# Patient Record
Sex: Male | Born: 1955 | Race: Black or African American | Hispanic: No | Smoking: Former smoker
Health system: Southern US, Community
[De-identification: ages and names within clinical notes are randomized; demographics above are authoritative.]

## PROBLEM LIST (undated history)

## (undated) DIAGNOSIS — I350 Nonrheumatic aortic (valve) stenosis: Secondary | ICD-10-CM

## (undated) DIAGNOSIS — R519 Headache, unspecified: Secondary | ICD-10-CM

## (undated) DIAGNOSIS — K759 Inflammatory liver disease, unspecified: Secondary | ICD-10-CM

## (undated) DIAGNOSIS — I10 Essential (primary) hypertension: Secondary | ICD-10-CM

## (undated) DIAGNOSIS — C801 Malignant (primary) neoplasm, unspecified: Secondary | ICD-10-CM

## (undated) DIAGNOSIS — R011 Cardiac murmur, unspecified: Secondary | ICD-10-CM

## (undated) HISTORY — DX: Essential (primary) hypertension: I10

## (undated) HISTORY — DX: Cardiac murmur, unspecified: R01.1

---
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
## 2009-02-09 ENCOUNTER — Ambulatory Visit: Payer: Self-pay | Admitting: Psychiatry

## 2009-02-09 ENCOUNTER — Other Ambulatory Visit (HOSPITAL_COMMUNITY): Admission: RE | Admit: 2009-02-09 | Discharge: 2009-04-25 | Payer: Self-pay | Admitting: Psychiatry

## 2009-02-23 ENCOUNTER — Other Ambulatory Visit (HOSPITAL_COMMUNITY): Payer: Self-pay | Admitting: Psychiatry

## 2011-02-18 LAB — URINE DRUGS OF ABUSE SCREEN W ALC, ROUTINE (REF LAB)
Cocaine Metabolites: NEGATIVE
Cocaine Metabolites: NEGATIVE
Creatinine,U: 49.4 mg/dL
Methadone: NEGATIVE
Methadone: NEGATIVE
Opiate Screen, Urine: NEGATIVE
Phencyclidine (PCP): NEGATIVE
Phencyclidine (PCP): NEGATIVE
Propoxyphene: NEGATIVE
Propoxyphene: NEGATIVE

## 2011-02-19 LAB — URINE DRUGS OF ABUSE SCREEN W ALC, ROUTINE (REF LAB)
Barbiturate Quant, Ur: NEGATIVE
Barbiturate Quant, Ur: NEGATIVE
Benzodiazepines.: NEGATIVE
Benzodiazepines.: NEGATIVE
Benzodiazepines.: NEGATIVE
Cocaine Metabolites: NEGATIVE
Creatinine,U: 199.6 mg/dL
Marijuana Metabolite: NEGATIVE
Methadone: NEGATIVE
Methadone: NEGATIVE
Methadone: NEGATIVE
Phencyclidine (PCP): NEGATIVE
Phencyclidine (PCP): NEGATIVE
Propoxyphene: NEGATIVE
Propoxyphene: NEGATIVE

## 2011-02-20 LAB — URINE DRUGS OF ABUSE SCREEN W ALC, ROUTINE (REF LAB)
Amphetamine Screen, Ur: NEGATIVE
Amphetamine Screen, Ur: NEGATIVE
Barbiturate Quant, Ur: NEGATIVE
Barbiturate Quant, Ur: NEGATIVE
Barbiturate Quant, Ur: NEGATIVE
Creatinine,U: 213.9 mg/dL
Creatinine,U: 159.2 mg/dL
Creatinine,U: 208.1 mg/dL
Marijuana Metabolite: NEGATIVE
Methadone: NEGATIVE
Methadone: NEGATIVE
Methadone: NEGATIVE
Phencyclidine (PCP): NEGATIVE
Phencyclidine (PCP): NEGATIVE

## 2012-08-20 ENCOUNTER — Other Ambulatory Visit (INDEPENDENT_AMBULATORY_CARE_PROVIDER_SITE_OTHER): Payer: 59

## 2012-08-20 ENCOUNTER — Encounter: Payer: Self-pay | Admitting: Internal Medicine

## 2012-08-20 ENCOUNTER — Ambulatory Visit (INDEPENDENT_AMBULATORY_CARE_PROVIDER_SITE_OTHER): Payer: 59 | Admitting: Internal Medicine

## 2012-08-20 VITALS — BP 112/64 | HR 78 | Temp 97.3°F | Resp 16 | Ht 68.0 in | Wt 134.2 lb

## 2012-08-20 DIAGNOSIS — R51 Headache: Secondary | ICD-10-CM

## 2012-08-20 DIAGNOSIS — R519 Headache, unspecified: Secondary | ICD-10-CM | POA: Insufficient documentation

## 2012-08-20 LAB — COMPREHENSIVE METABOLIC PANEL
ALT: 39 U/L (ref 0–53)
AST: 47 U/L — ABNORMAL HIGH (ref 0–37)
Albumin: 3.5 g/dL (ref 3.5–5.2)
Calcium: 9.3 mg/dL (ref 8.4–10.5)
Chloride: 103 mEq/L (ref 96–112)
Potassium: 3.5 mEq/L (ref 3.5–5.1)
Sodium: 139 mEq/L (ref 135–145)

## 2012-08-20 LAB — CBC WITH DIFFERENTIAL/PLATELET
Basophils Absolute: 0 10*3/uL (ref 0.0–0.1)
Eosinophils Absolute: 0.1 10*3/uL (ref 0.0–0.7)
Lymphocytes Relative: 40.6 % (ref 12.0–46.0)
MCHC: 32.7 g/dL (ref 30.0–36.0)
MCV: 100.2 fl — ABNORMAL HIGH (ref 78.0–100.0)
Monocytes Absolute: 0.4 10*3/uL (ref 0.1–1.0)
Neutro Abs: 1.7 10*3/uL (ref 1.4–7.7)
Neutrophils Relative %: 45.1 % (ref 43.0–77.0)
RDW: 13.1 % (ref 11.5–14.6)

## 2012-08-20 LAB — SEDIMENTATION RATE: Sed Rate: 14 mm/hr (ref 0–22)

## 2012-08-20 LAB — TSH: TSH: 0.42 u[IU]/mL (ref 0.35–5.50)

## 2012-08-20 MED ORDER — HYDROCODONE-ACETAMINOPHEN 5-500 MG PO TABS: 2.0000 | ORAL_TABLET | Freq: Four times a day (QID) | ORAL | Status: DC | PRN

## 2012-08-20 NOTE — Patient Instructions (Signed)
General Headache Without Cause A headache is pain or discomfort felt around the head or neck area. The specific cause of a headache may not be found. There are many causes and types of headaches. A few common ones are:  Tension headaches.  Migraine headaches.  Cluster headaches.  Chronic daily headaches. HOME CARE INSTRUCTIONS   Keep all follow-up appointments with your caregiver or any specialist referral.  Only take over-the-counter or prescription medicines for pain or discomfort as directed by your caregiver.  Lie down in a dark, quiet room when you have a headache.  Keep a headache journal to find out what may trigger your migraine headaches. For example, write down:  What you eat and drink.  How much sleep you get.  Any change to your diet or medicines.  Try massage or other relaxation techniques.  Put ice packs or heat on the head and neck. Use these 3 to 4 times per day for 15 to 20 minutes each time, or as needed.  Limit stress.  Sit up straight, and do not tense your muscles.  Quit smoking if you smoke.  Limit alcohol use.  Decrease the amount of caffeine you drink, or stop drinking caffeine.  Eat and sleep on a regular schedule.  Get 7 to 9 hours of sleep, or as recommended by your caregiver.  Keep lights dim if bright lights bother you and make your headaches worse. SEEK MEDICAL CARE IF:   You have problems with the medicines you were prescribed.  Your medicines are not working.  You have a change from the usual headache.  You have nausea or vomiting. SEEK IMMEDIATE MEDICAL CARE IF:   Your headache becomes severe.  You have a fever.  You have a stiff neck.  You have loss of vision.  You have muscular weakness or loss of muscle control.  You start losing your balance or have trouble walking.  You feel faint or pass out.  You have severe symptoms that are different from your first symptoms. MAKE SURE YOU:   Understand these  instructions.  Will watch your condition.  Will get help right away if you are not doing well or get worse. Document Released: 10/28/2005 Document Revised: 01/20/2012 Document Reviewed: 11/13/2011 Exit[employer] Patient Information 2013 [employer].  

## 2012-08-20 NOTE — Assessment & Plan Note (Signed)
Labs today to see if he has any metabolic disease, CT of head to look for mass, avm, cva, sdh, etc. Vicodin as needed for pain.

## 2012-08-20 NOTE — Progress Notes (Signed)
Subjective:    Patient ID: Corey Armstrong, male    DOB: 1956-06-24, 56 y.o.   MRN: [id number]  Headache  This is a recurrent problem. Episode onset: for 4 months. The problem occurs constantly. The problem has been unchanged. The pain is located in the bilateral and frontal region. The pain does not radiate. The quality of the pain is described as aching. The pain is at a severity of 2/10. The pain is mild. Associated symptoms include blurred vision and numbness (in left hand). Pertinent negatives include no abdominal pain, abnormal behavior, anorexia, back pain, coughing, dizziness, drainage, ear pain, eye pain, eye redness, facial sweating, fever, hearing loss, insomnia, loss of balance, muscle aches, nausea, neck pain, phonophobia, photophobia, rhinorrhea, scalp tenderness, seizures, sinus pressure, sore throat, swollen glands, tingling, tinnitus, visual change, vomiting, weakness or weight loss. Nothing aggravates the symptoms. He has tried NSAIDs for the symptoms. The treatment provided moderate relief. There is no history of cancer, hypertension, migraine headaches, migraines in the family, recent head traumas or sinus disease.      Review of Systems  Constitutional: Negative for fever, chills, weight loss, diaphoresis, activity change, appetite change, fatigue and unexpected weight change.  HENT: Negative for hearing loss, ear pain, sore throat, rhinorrhea, neck pain, sinus pressure and tinnitus.   Eyes: Positive for blurred vision and visual disturbance. Negative for photophobia, pain, discharge, redness and itching.  Respiratory: Negative for apnea, cough, choking, chest tightness, shortness of breath, wheezing and stridor.   Cardiovascular: Negative for chest pain, palpitations and leg swelling.  Gastrointestinal: Negative for nausea, vomiting, abdominal pain, diarrhea, constipation, blood in stool and anorexia.  Genitourinary: Negative.   Musculoskeletal: Negative.  Negative for back  pain.  Skin: Negative.   Neurological: Positive for numbness (in left hand) and headaches. Negative for dizziness, tingling, tremors, seizures, syncope, facial asymmetry, speech difficulty, weakness, light-headedness and loss of balance.  Hematological: Negative for adenopathy. Does not bruise/bleed easily.  Psychiatric/Behavioral: Negative.  The patient does not have insomnia.        Objective:   Physical Exam  Vitals reviewed. Constitutional: He is oriented to person, place, and time. He appears well-developed and well-nourished. No distress.  HENT:  Head: Normocephalic and atraumatic.  Mouth/Throat: Oropharynx is clear and moist. No oropharyngeal exudate.  Eyes: Conjunctivae normal and EOM are normal. Pupils are equal, round, and reactive to light. Right eye exhibits no discharge. Left eye exhibits no discharge. No scleral icterus.  Neck: Normal range of motion. Neck supple. No JVD present. No tracheal deviation present. No thyromegaly present.  Cardiovascular: Normal rate, regular rhythm, S1 normal, S2 normal and intact distal pulses.  PMI is not displaced.  Exam reveals no gallop, no S3, no S4 and no friction rub.   Murmur heard.  Decrescendo systolic murmur is present with a grade of 1/6   No diastolic murmur is present  Pulses:      Carotid pulses are 1+ on the right side, and 1+ on the left side.      Radial pulses are 1+ on the right side, and 1+ on the left side.       Femoral pulses are 1+ on the right side, and 1+ on the left side.      Popliteal pulses are 1+ on the right side, and 1+ on the left side.       Dorsalis pedis pulses are 1+ on the right side, and 1+ on the left side.       Posterior tibial  pulses are 1+ on the right side, and 1+ on the left side.  Pulmonary/Chest: Effort normal and breath sounds normal. No stridor. No respiratory distress. He has no wheezes. He has no rales. He exhibits no tenderness.  Abdominal: Soft. Bowel sounds are normal. He exhibits no  distension and no mass. There is no tenderness. There is no rebound and no guarding.  Musculoskeletal: Normal range of motion. He exhibits no edema and no tenderness.  Lymphadenopathy:    He has no cervical adenopathy.  Neurological: He is alert and oriented to person, place, and time. He has normal strength. He displays no atrophy, no tremor and normal reflexes. No cranial nerve deficit or sensory deficit. He exhibits normal muscle tone. He displays a negative Romberg sign. He displays no seizure activity. Coordination and gait normal. He displays no Babinski's sign on the right side. He displays no Babinski's sign on the left side.  Reflex Scores:      Tricep reflexes are 1+ on the right side and 1+ on the left side.      Bicep reflexes are 1+ on the right side and 1+ on the left side.      Brachioradialis reflexes are 1+ on the right side and 1+ on the left side.      Patellar reflexes are 1+ on the right side and 1+ on the left side.      Achilles reflexes are 1+ on the right side and 1+ on the left side. Skin: Skin is warm and dry. No rash noted. He is not diaphoretic. No erythema. No pallor.  Psychiatric: He has a normal mood and affect. His behavior is normal. Judgment and thought content normal.          Assessment & Plan:

## 2012-08-21 NOTE — Addendum Note (Signed)
Addended by: Etta Grandchild on: 08/21/2012 10:26 AM   Modules accepted: Orders

## 2012-09-08 ENCOUNTER — Other Ambulatory Visit: Payer: 59

## 2014-05-30 ENCOUNTER — Telehealth: Payer: Self-pay

## 2014-05-30 NOTE — Telephone Encounter (Signed)
Pt wanted to know if our office took care of dental problems; advised no. Pt wanted to know if he could get med for pain. Advised Dr Ronnald Ramp is at [facility] office and transferred call to [facility] office for pt.

## 2017-11-26 ENCOUNTER — Ambulatory Visit: Payer: Commercial Managed Care - PPO | Admitting: Internal Medicine

## 2017-11-26 ENCOUNTER — Encounter: Payer: Self-pay | Admitting: Internal Medicine

## 2017-11-26 ENCOUNTER — Encounter (INDEPENDENT_AMBULATORY_CARE_PROVIDER_SITE_OTHER): Payer: Self-pay

## 2017-11-26 VITALS — BP 124/80 | HR 71 | Temp 97.8°F | Wt 135.0 lb

## 2017-11-26 DIAGNOSIS — H6123 Impacted cerumen, bilateral: Secondary | ICD-10-CM

## 2017-11-26 DIAGNOSIS — Z114 Encounter for screening for human immunodeficiency virus [HIV]: Secondary | ICD-10-CM | POA: Diagnosis not present

## 2017-11-26 DIAGNOSIS — Z125 Encounter for screening for malignant neoplasm of prostate: Secondary | ICD-10-CM | POA: Diagnosis not present

## 2017-11-26 DIAGNOSIS — Z1159 Encounter for screening for other viral diseases: Secondary | ICD-10-CM

## 2017-11-26 DIAGNOSIS — Z0000 Encounter for general adult medical examination without abnormal findings: Secondary | ICD-10-CM

## 2017-11-26 DIAGNOSIS — Z0001 Encounter for general adult medical examination with abnormal findings: Secondary | ICD-10-CM

## 2017-11-26 NOTE — Progress Notes (Signed)
HPI  Pt presents to the clinic today to establish care. He has not had a PCP in many years. He would like his annual exam today.  Flu: never Tetanus:  >10 years ago Colon Screening: never Vision Screening: as needed Dentist: as needed  Diet: He does eat meat. He consumes fruits and veggies daily. He does eat fried food. He drinks mostly soda. Exercise: None  Past Medical History:  Diagnosis Date  . Murmur, heart 1963    No current outpatient medications on file.   No current facility-administered medications for this visit.     No Known Allergies  Family History  Problem Relation Age of Onset  . Arthritis Mother   . Cancer Neg Hx   . Diabetes Neg Hx   . Drug abuse Neg Hx   . Heart disease Neg Hx   . Hyperlipidemia Neg Hx   . Hypertension Neg Hx   . Kidney disease Neg Hx   . Stroke Neg Hx     Social History   Socioeconomic History  . Marital status: Married    Spouse name: Not on file  . Number of children: Not on file  . Years of education: Not on file  . Highest education level: Not on file  Social Needs  . Financial resource strain: Not on file  . Food insecurity - worry: Not on file  . Food insecurity - inability: Not on file  . Transportation needs - medical: Not on file  . Transportation needs - non-medical: Not on file  Occupational History  . Not on file  Tobacco Use  . Smoking status: Current Some Day Smoker    Types: Cigars  . Smokeless tobacco: Never Used  . Tobacco comment: 3 per week  Substance and Sexual Activity  . Alcohol use: No  . Drug use: No  . Sexual activity: Not Currently  Other Topics Concern  . Not on file  Social History Narrative  . Not on file    ROS:  Constitutional: Denies fever, malaise, fatigue, headache or abrupt weight changes.  HEENT: Denies eye pain, eye redness, ear pain, ringing in the ears, wax buildup, runny nose, nasal congestion, bloody nose, or sore throat. Respiratory: Denies difficulty breathing,  shortness of breath, cough or sputum production.   Cardiovascular: Denies chest pain, chest tightness, palpitations or swelling in the hands or feet.  Gastrointestinal: Denies abdominal pain, bloating, constipation, diarrhea or blood in the stool.  GU: Denies frequency, urgency, pain with urination, blood in urine, odor or discharge. Musculoskeletal: Denies decrease in range of motion, difficulty with gait, muscle pain or joint pain and swelling.  Skin: Denies redness, rashes, lesions or ulcercations.  Neurological: Denies dizziness, difficulty with memory, difficulty with speech or problems with balance and coordination.  Psych: Denies anxiety, depression, SI/HI.  No other specific complaints in a complete review of systems (except as listed in HPI above).  PE:  BP 124/80   Pulse 71   Temp 97.8 F (36.6 C) (Oral)   Wt 135 lb (61.2 kg)   SpO2 98%   BMI 20.53 kg/m   Wt Readings from Last 3 Encounters:  11/26/17 135 lb (61.2 kg)  08/20/12 134 lb 4 oz (60.9 kg)    General: Appears his stated age, appears malnourished, in NAD. HEENT: Head: normal shape and size; Eyes: sclera white, no icterus, conjunctiva pink, PERRLA and EOMs intact; Ears: bilateral cerumen impaction;Throat/Mouth: Teeth present, mucosa pink and moist, no lesions or ulcerations noted.  Neck: Neck supple,  trachea midline. No masses, lumps or thyromegaly present.  Cardiovascular: Normal rate and rhythm. S1,S2 noted.  Murmur noted. No JVD or BLE edema. No carotid bruits noted. Pulmonary/Chest: Normal effort and positive vesicular breath sounds. No respiratory distress. No wheezes, rales or ronchi noted.  Abdomen: Soft and nontender. Normal bowel sounds, no bruits noted. No distention or masses noted. Liver, spleen and kidneys non palpable. Musculoskeletal: Strength 5/5 BUE/BLE. No signs of joint swelling. No difficulty with gait.  Neurological: Alert and oriented. Cranial nerves II-XII grossly intact. Coordination normal.   Psychiatric: Mood and affect normal. Behavior is normal. Judgment and thought content normal.     BMET    Component Value Date/Time   NA 139 08/20/2012 1520   K 3.5 08/20/2012 1520   CL 103 08/20/2012 1520   CO2 31 08/20/2012 1520   GLUCOSE 87 08/20/2012 1520   BUN 15 08/20/2012 1520   CREATININE 1.0 08/20/2012 1520   CALCIUM 9.3 08/20/2012 1520    Lipid Panel  No results found for: CHOL, TRIG, HDL, CHOLHDL, VLDL, LDLCALC  CBC    Component Value Date/Time   WBC 3.8 (L) 08/20/2012 1520   RBC 3.99 (L) 08/20/2012 1520   HGB 13.1 08/20/2012 1520   HCT 40.0 08/20/2012 1520   PLT 151.0 08/20/2012 1520   MCV 100.2 (H) 08/20/2012 1520   MCHC 32.7 08/20/2012 1520   RDW 13.1 08/20/2012 1520   LYMPHSABS 1.5 08/20/2012 1520   MONOABS 0.4 08/20/2012 1520   EOSABS 0.1 08/20/2012 1520   BASOSABS 0.0 08/20/2012 1520    Hgb A1C No results found for: HGBA1C   Assessment and Plan:  Preventative Health Maintenance:  He declines flu or tetanus booster today He declines colon cancer screening Encouraged him to consume a balanced diet and exercise regimen Advised him to see an eye doctor and dentist annually Will check CBC, CMET, Lipid, PSA, HIV and  Hep C today  Bilateral Cerumen Impaction:  Try Debrox OTC  RTC in 1 year, sooner if needed Webb Silversmith, NP

## 2017-11-26 NOTE — Patient Instructions (Signed)

## 2017-11-27 LAB — COMPREHENSIVE METABOLIC PANEL
ALT: 53 U/L (ref 0–53)
AST: 63 U/L — ABNORMAL HIGH (ref 0–37)
Albumin: 3.8 g/dL (ref 3.5–5.2)
Alkaline Phosphatase: 57 U/L (ref 39–117)
BILIRUBIN TOTAL: 0.7 mg/dL (ref 0.2–1.2)
BUN: 22 mg/dL (ref 6–23)
CHLORIDE: 109 meq/L (ref 96–112)
CO2: 27 meq/L (ref 19–32)
Calcium: 9.4 mg/dL (ref 8.4–10.5)
Creatinine, Ser: 1.1 mg/dL (ref 0.40–1.50)
GFR: 87.93 mL/min (ref >60.00)
Glucose, Bld: 101 mg/dL — ABNORMAL HIGH (ref 70–99)
Potassium: 3.6 mEq/L (ref 3.5–5.1)
Sodium: 142 mEq/L (ref 135–145)
Total Protein: 7.7 g/dL (ref 6.0–8.3)

## 2017-11-27 LAB — LIPID PANEL
CHOL/HDL RATIO: 4
Cholesterol: 166 mg/dL (ref 0–200)
HDL: 37.5 mg/dL — ABNORMAL LOW (ref >39.00)
LDL Cholesterol: 113 mg/dL — ABNORMAL HIGH (ref 0–99)
NonHDL: 128.54
Triglycerides: 80 mg/dL (ref 0.0–149.0)
VLDL: 16 mg/dL (ref 0.0–40.0)

## 2017-11-27 LAB — PSA: PSA: 0.71 ng/mL (ref 0.10–4.00)

## 2017-11-27 LAB — CBC
HCT: 39.5 % (ref 39.0–52.0)
HEMOGLOBIN: 13.3 g/dL (ref 13.0–17.0)
MCHC: 33.6 g/dL (ref 30.0–36.0)
MCV: 101.2 fl — AB (ref 78.0–100.0)
PLATELETS: 128 10*3/uL — AB (ref 150.0–400.0)
RBC: 3.91 Mil/uL — ABNORMAL LOW (ref 4.22–5.81)
RDW: 13.3 % (ref 11.5–15.5)
WBC: 4.5 10*3/uL (ref 4.0–10.5)

## 2017-11-28 LAB — HEPATITIS C ANTIBODY
HEP C AB: REACTIVE — AB
SIGNAL TO CUT-OFF: 16.3 — ABNORMAL HIGH (ref <1.00)

## 2017-11-28 LAB — HCV RNA,QUANTITATIVE REAL TIME PCR
HCV Quantitative Log: 5.87 Log IU/mL — ABNORMAL HIGH
HCV RNA, PCR, QN: 748000 IU/mL — ABNORMAL HIGH

## 2017-11-28 LAB — HIV ANTIBODY (ROUTINE TESTING W REFLEX): HIV 1&2 Ab, 4th Generation: NONREACTIVE

## 2017-12-16 ENCOUNTER — Other Ambulatory Visit: Payer: Self-pay | Admitting: Internal Medicine

## 2017-12-16 DIAGNOSIS — R768 Other specified abnormal immunological findings in serum: Secondary | ICD-10-CM

## 2018-01-17 ENCOUNTER — Encounter: Payer: Self-pay | Admitting: Internal Medicine

## 2018-02-19 ENCOUNTER — Other Ambulatory Visit: Payer: Self-pay | Admitting: Nurse Practitioner

## 2018-02-19 DIAGNOSIS — B182 Chronic viral hepatitis C: Secondary | ICD-10-CM

## 2018-02-27 ENCOUNTER — Ambulatory Visit
Admission: RE | Admit: 2018-02-27 | Discharge: 2018-02-27 | Disposition: A | Discharge status: Home / Self Care | Payer: Commercial Managed Care - PPO | Source: Ambulatory Visit | Attending: Nurse Practitioner | Admitting: Nurse Practitioner

## 2018-02-27 DIAGNOSIS — B182 Chronic viral hepatitis C: Secondary | ICD-10-CM

## 2018-08-19 ENCOUNTER — Other Ambulatory Visit: Payer: Self-pay | Admitting: Nurse Practitioner

## 2018-08-19 DIAGNOSIS — K7400 Hepatic fibrosis, unspecified: Secondary | ICD-10-CM

## 2018-08-19 DIAGNOSIS — K824 Cholesterolosis of gallbladder: Secondary | ICD-10-CM

## 2018-09-08 ENCOUNTER — Ambulatory Visit
Admission: RE | Admit: 2018-09-08 | Discharge: 2018-09-08 | Disposition: A | Discharge status: Home / Self Care | Payer: Commercial Managed Care - PPO | Source: Ambulatory Visit | Attending: Nurse Practitioner | Admitting: Nurse Practitioner

## 2018-09-08 DIAGNOSIS — K7400 Hepatic fibrosis, unspecified: Secondary | ICD-10-CM

## 2018-09-08 DIAGNOSIS — K824 Cholesterolosis of gallbladder: Secondary | ICD-10-CM

## 2018-09-11 IMAGING — US US ABDOMEN COMPLETE W/ ELASTOGRAPHY
1 series · 12 of 12 positions shown · non-contrast
Comparison: None

CLINICAL DATA: Hepatitis-C carrier, smoker



[Series 1: us abdomen complete w/ elastography · 0.12mm/px · 12 of 12 slices shown]
[im 1/12]
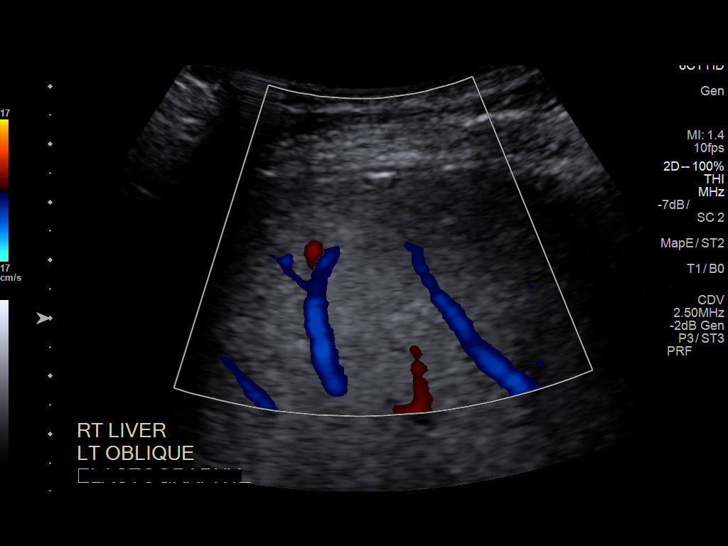
[im 2/12]
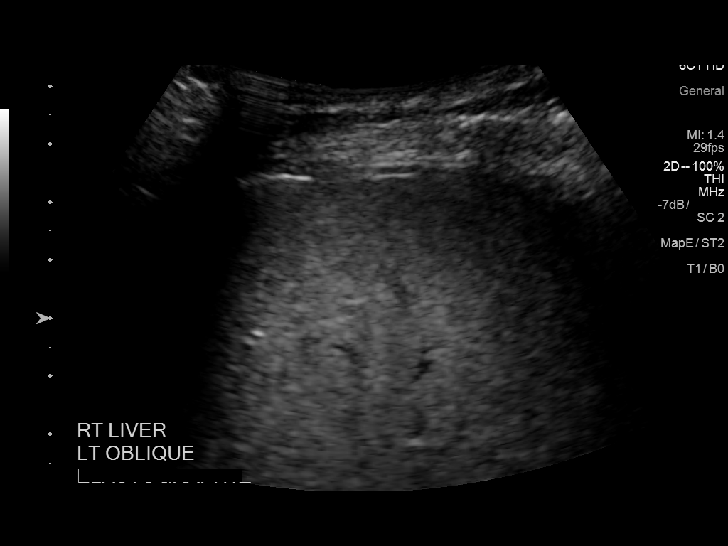
[im 3/12]
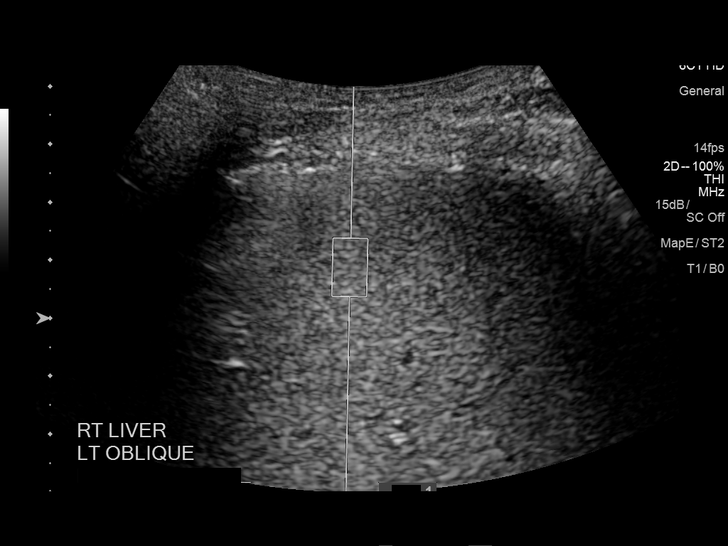
[im 4/12]
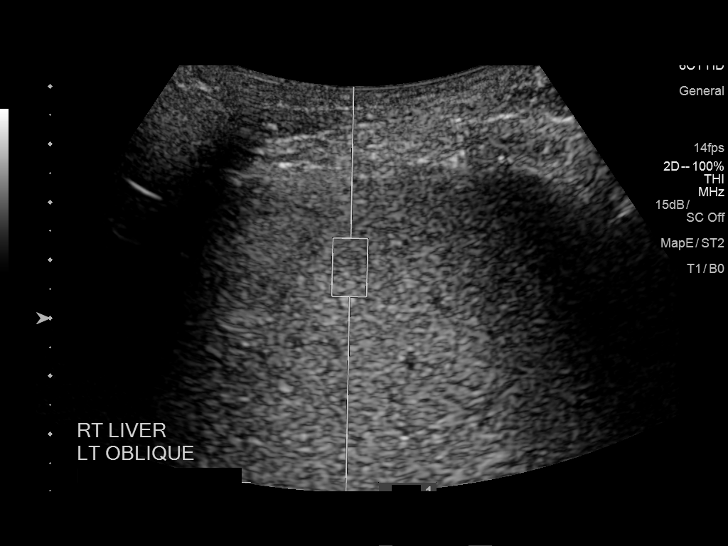
[im 5/12]
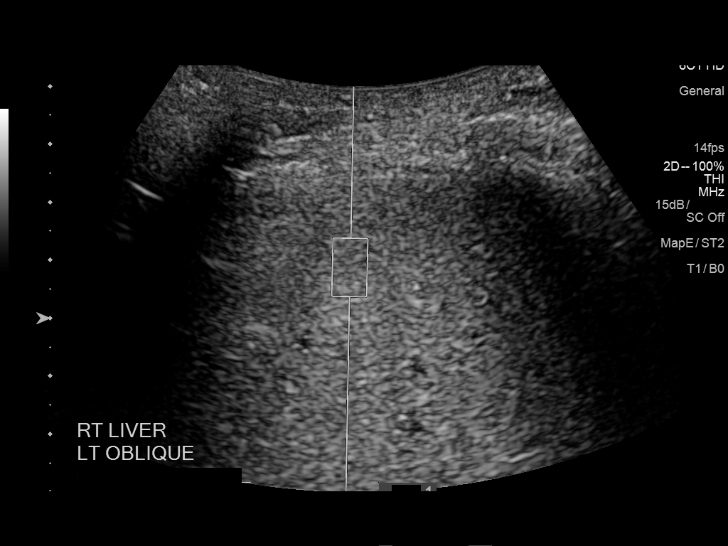
[im 6/12]
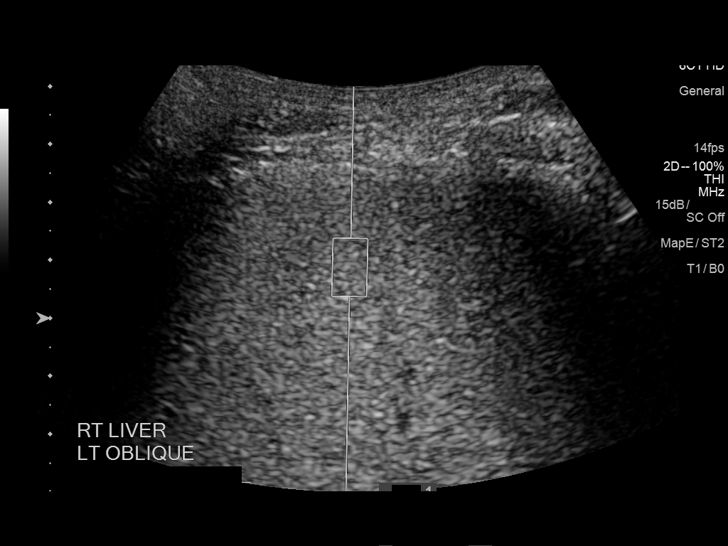
[im 7/12]
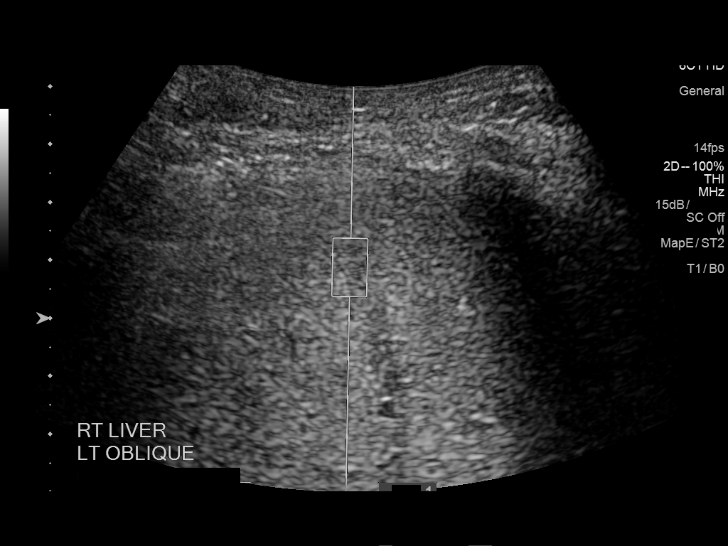
[im 8/12]
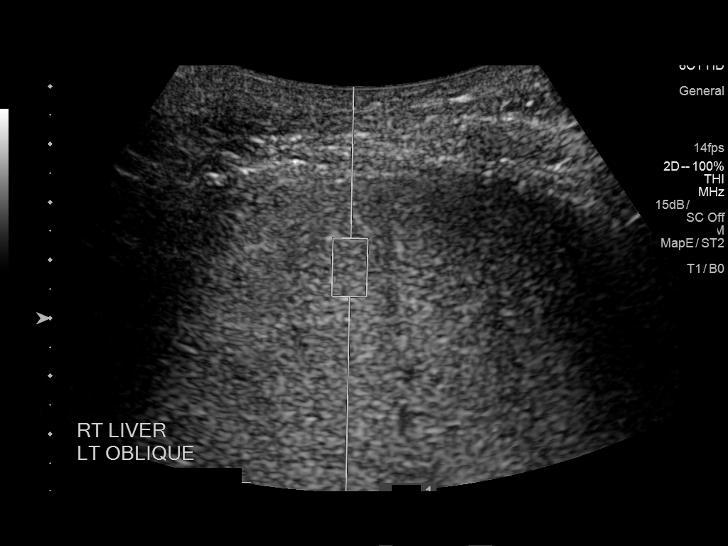
[im 9/12]
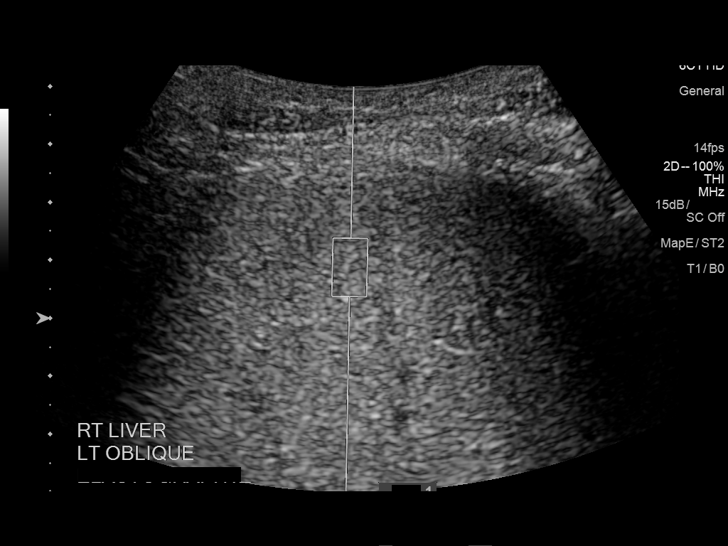
[im 10/12]
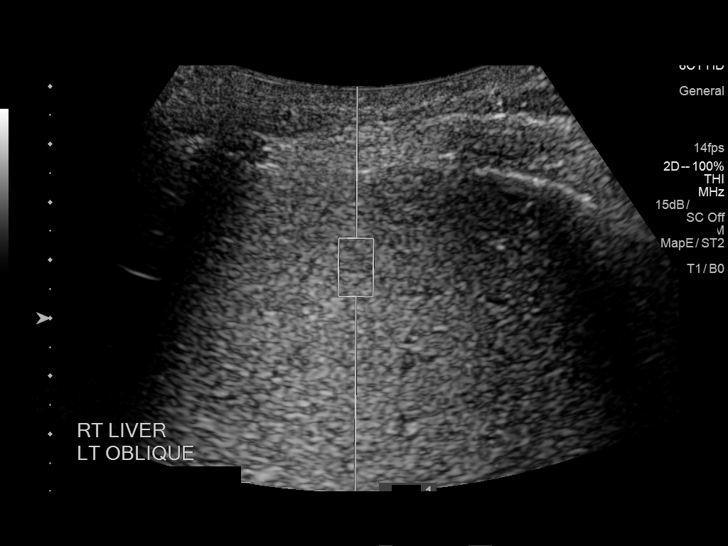
[im 11/12]
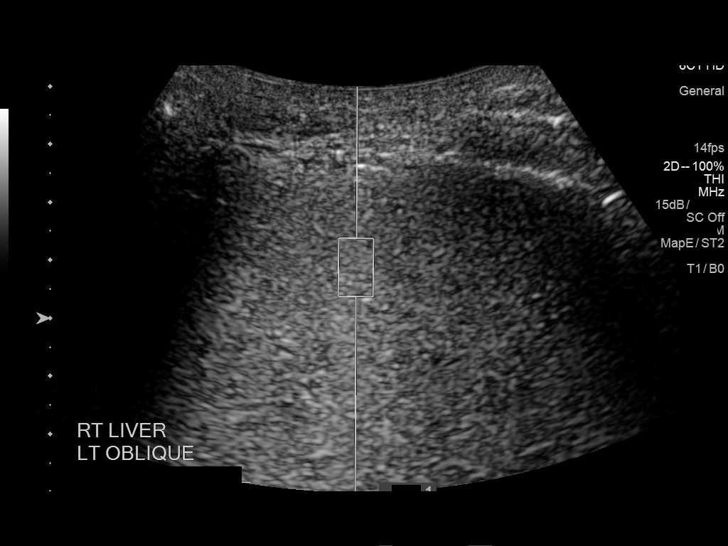
[im 12/12]
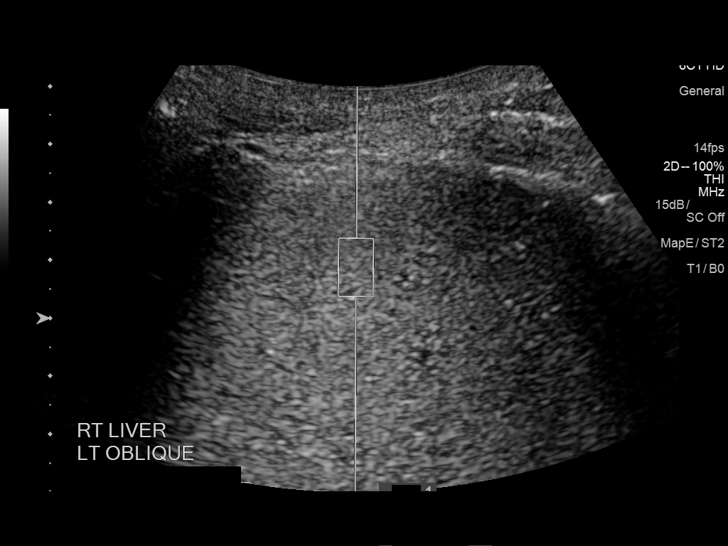

[12 of 12 positions shown; findings below may reference images not displayed]

FINDINGS: ULTRASOUND ABDOMEN

Gallbladder: No mobile shadowing calcifications. 10 mm length thin
polyp versus more likely suspect mucosal fold. Focus of ring down
artifact consistent with cholesterolosis. No gallbladder wall
thickening, pericholecystic fluid, or sonographic Murphy sign.

Common bile duct: Diameter: Question 8 mm diameter, containing
scattered intraluminal nonshadowing echogenic foci which could
represent sludge.

Liver: Echogenic parenchyma, likely fatty infiltration though this
can be seen with cirrhosis and certain infiltrative disorders. Small
cyst RIGHT lobe liver 8 x 7 x 6 mm. No focal hepatic mass or
nodularity otherwise seen. Portal vein is patent on color Doppler
imaging with normal direction of blood flow towards the liver.

IVC: Normal appearance

Pancreas: Normal appearance

Spleen: Normal appearance, 7.5 cm length

Right Kidney: Length: 11.8 cm. Normal cortical thickness and
echogenicity. Shadowing calculi and RIGHT kidney to 14 mm diameter.
Small RIGHT renal cysts measuring 11 mm and 16 mm in greatest sizes.
No hydronephrosis or solid renal lesion

Left Kidney: Length: 10.9 cm. Normal morphology without mass or
hydronephrosis.

Abdominal aorta: Normal caliber with scattered atherosclerotic
changes

Other findings: No free fluid

ULTRASOUND HEPATIC ELASTOGRAPHY

Device: Siemens Helix VTQ

Patient position: Oblique

Transducer [id number]

Number of measurements: 10

Hepatic segment:  8

Median velocity:   1.25 m/sec

IQR:

IQR/Median velocity ratio:

Corresponding Metavir fibrosis score:  F2 + some F3

Risk of fibrosis: Moderate

Limitations of exam: None

Please note that abnormal shear wave velocities may also be
identified in clinical settings other than with hepatic fibrosis,
such as: acute hepatitis, elevated right heart and central venous
pressures including use of beta blockers, SAMOURAY disease
(SAMOURAY), infiltrative processes such as
mastocytosis/amyloidosis/infiltrative tumor, extrahepatic
cholestasis, in the post-prandial state, and liver transplantation.
Correlation with patient history, laboratory data, and clinical
condition recommended.
IMPRESSION: ULTRASOUND ABDOMEN:
Question fatty infiltration of liver.

Small hepatic and RIGHT renal cysts.

Nonshadowing RIGHT renal calculi.

Question slightly thick/irregular mucosal fold in the gallbladder
versus less likely gallbladder polyp; follow-up ultrasound in 6
months recommended to assess stability.

Small focus of cholesterolosis within gallbladder.

ULTRASOUND HEPATIC ELASTOGRAPHY:

Median hepatic shear wave velocity is calculated at 1.25 m/sec.

Corresponding Metavir fibrosis score is F2 + some F3.

Risk of fibrosis is moderate.

Follow-up: Additional testing appropriate

## 2019-03-23 IMAGING — US US ABDOMEN LIMITED
1 series · 14 of 25 positions shown · non-contrast
Comparison: [DATE].

CLINICAL DATA: Fibrosis of the liver, gallbladder polyp.

EXAM:
ULTRASOUND ABDOMEN LIMITED RIGHT UPPER QUADRANT

[Series 1: us abdomen limited · 0.28mm/px · 14 of 56 slices shown]
[im 1/56]
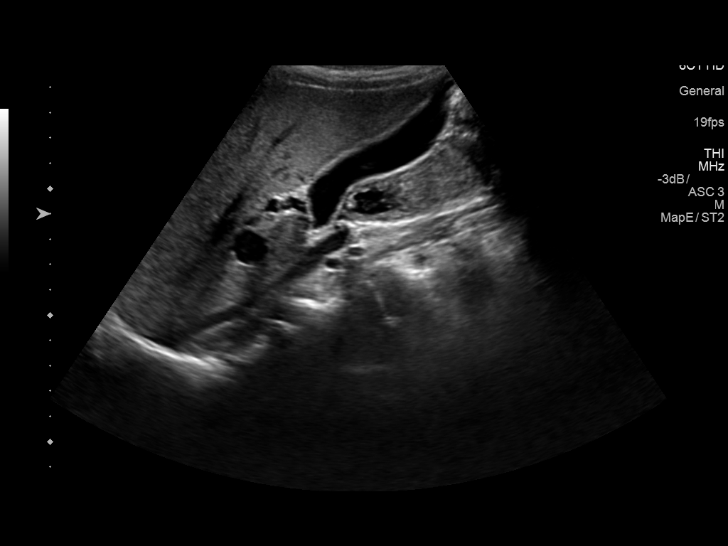
[im 5/56]
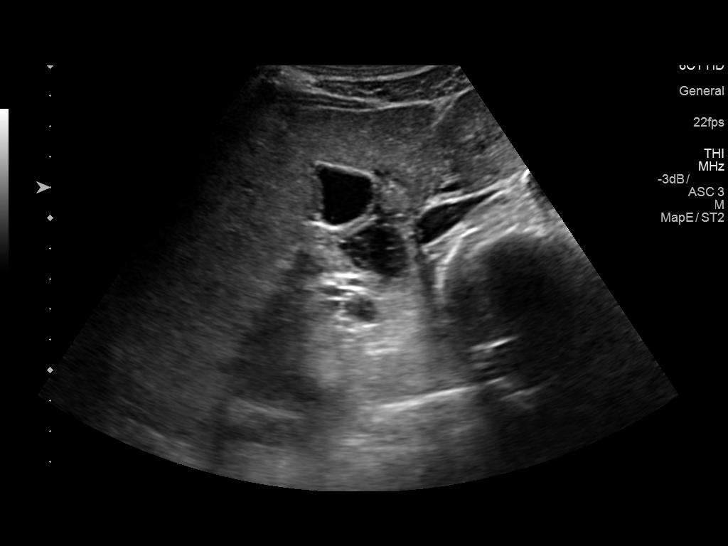
[im 10/56]
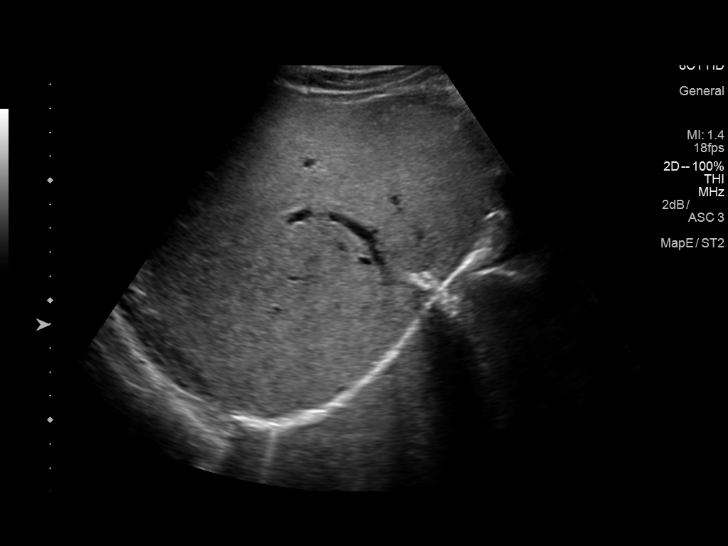
[im 14/56]
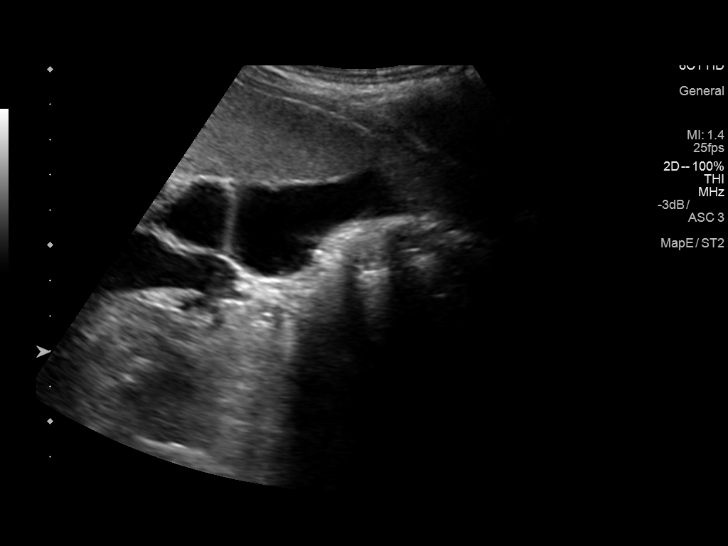
[im 19/56]
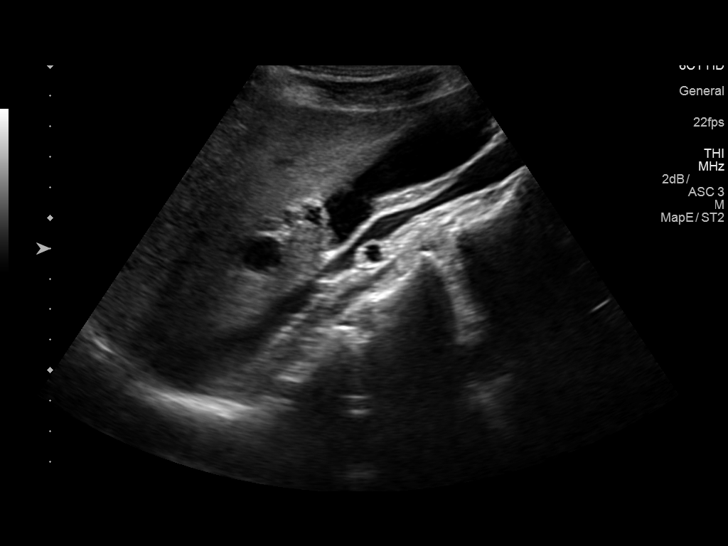
[im 21/56]
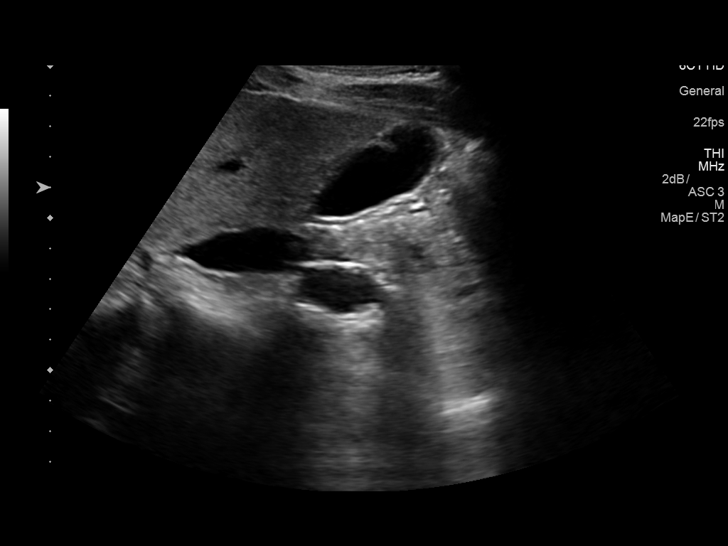
[im 26/56]
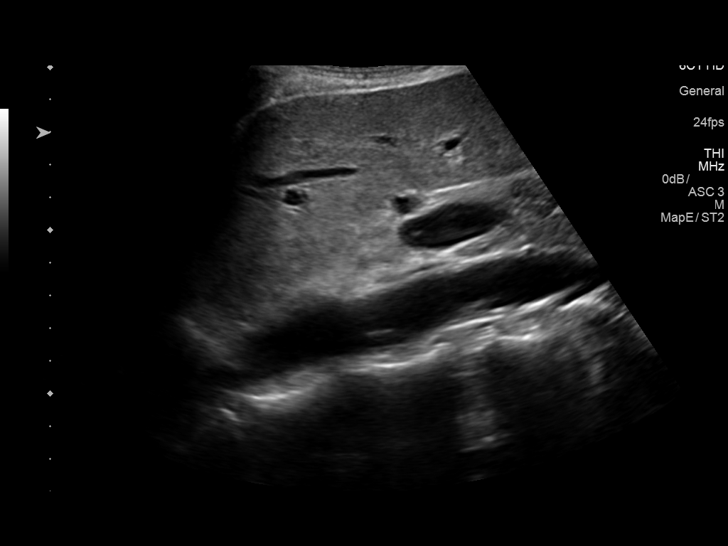
[im 30/56]
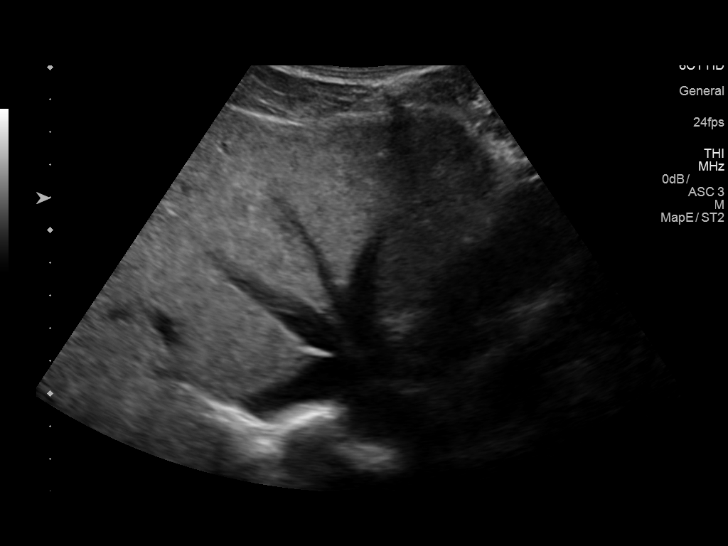
[im 35/56]
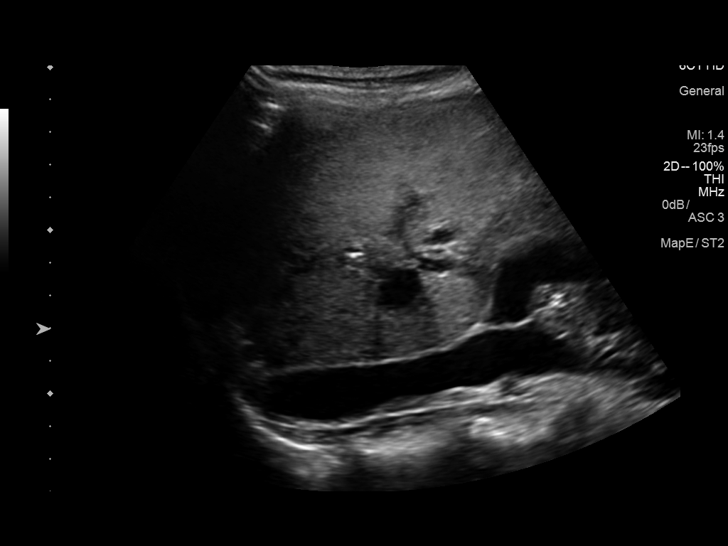
[im 37/56]
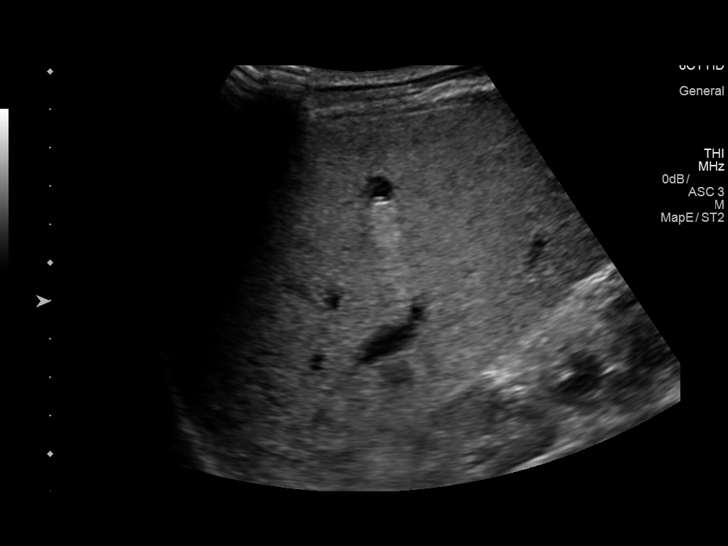
[im 42/56]
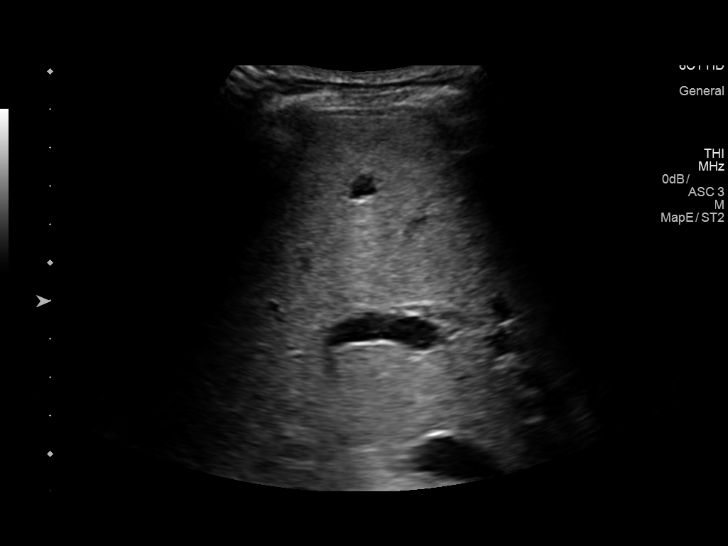
[im 46/56]
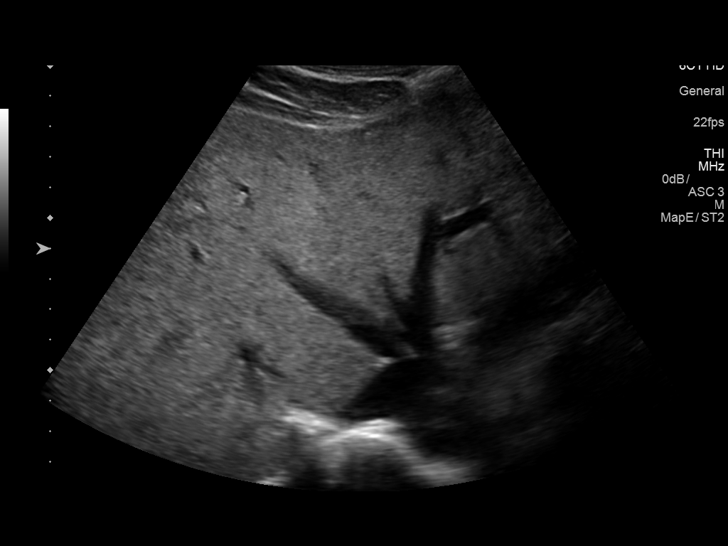
[im 51/56]
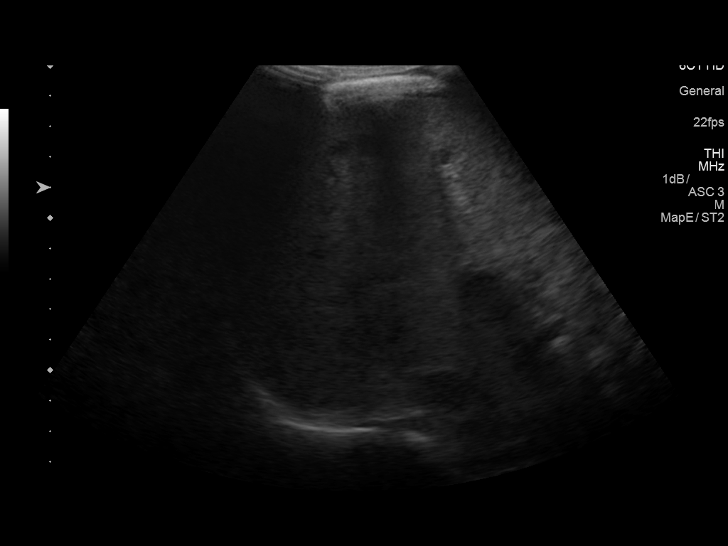
[im 56/56]
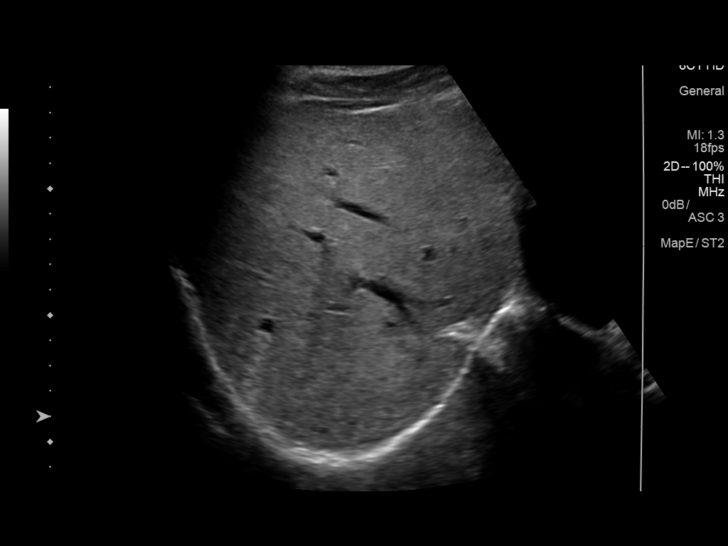

[14 of 25 positions shown; findings below may reference images not displayed]

FINDINGS: Gallbladder:

No gallstones or wall thickening. No polyps. Negative sonographic
Murphy sign.

Common bile duct:

Diameter: 4 mm, within normal limits.

Liver:

Diffusely increased in echogenicity. There are 2 anechoic or nearly
anechoic subcentimeter lesions in the liver, in addition to a
hypoechoic lesion in the right hepatic lobe measuring 8 x 8 x 7 mm.
Portal vein is patent with normal directional flow.
IMPRESSION: 1. Hepatic steatosis.
2. Subcentimeter hypoechoic lesion in the right hepatic lobe is
difficult to characterize due to size. A solid lesion cannot be
definitively excluded. If further evaluation is desired, MR abdomen
without and with contrast could be performed.
3. Probable additional mildly complex cysts in the right hepatic
lobe.
4. Gallbladder is unremarkable.

## 2019-04-26 ENCOUNTER — Other Ambulatory Visit: Payer: Self-pay | Admitting: Nurse Practitioner

## 2019-04-26 DIAGNOSIS — K7469 Other cirrhosis of liver: Secondary | ICD-10-CM

## 2019-05-13 ENCOUNTER — Ambulatory Visit
Admission: RE | Admit: 2019-05-13 | Discharge: 2019-05-13 | Disposition: A | Discharge status: Home / Self Care | Payer: Commercial Managed Care - PPO | Source: Ambulatory Visit | Attending: Nurse Practitioner | Admitting: Nurse Practitioner

## 2019-05-13 ENCOUNTER — Other Ambulatory Visit: Payer: Self-pay

## 2019-05-13 DIAGNOSIS — K7469 Other cirrhosis of liver: Secondary | ICD-10-CM

## 2019-08-26 ENCOUNTER — Other Ambulatory Visit: Payer: Self-pay | Admitting: Nurse Practitioner

## 2019-08-26 DIAGNOSIS — K769 Liver disease, unspecified: Secondary | ICD-10-CM

## 2019-08-26 DIAGNOSIS — K7400 Hepatic fibrosis, unspecified: Secondary | ICD-10-CM

## 2019-11-12 DIAGNOSIS — C801 Malignant (primary) neoplasm, unspecified: Secondary | ICD-10-CM

## 2019-11-12 HISTORY — DX: Malignant (primary) neoplasm, unspecified: C80.1

## 2019-11-25 IMAGING — US ULTRASOUND ABDOMEN LIMITED
1 series · 14 of 25 positions shown · non-contrast
Comparison: [DATE]

CLINICAL DATA: Cirrhosis.

EXAM:
ULTRASOUND ABDOMEN LIMITED RIGHT UPPER QUADRANT

[Series 1: ultrasound abdomen limited · 0.14mm/px · 14 of 59 slices shown]
[im 1/59]
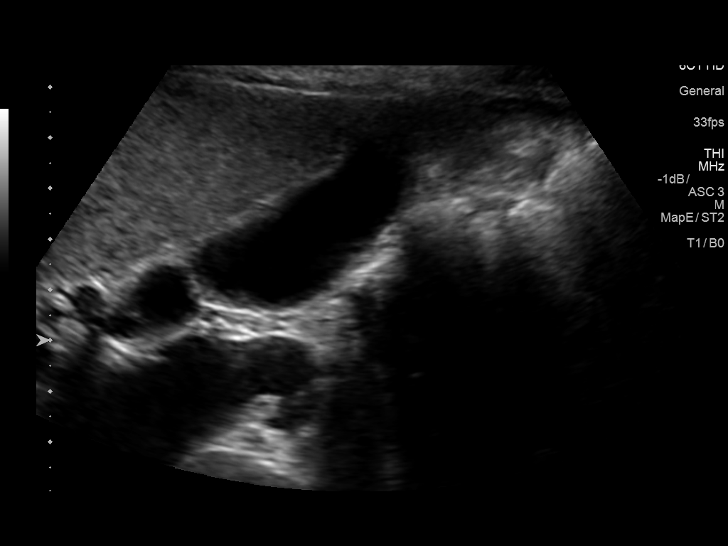
[im 5/59]
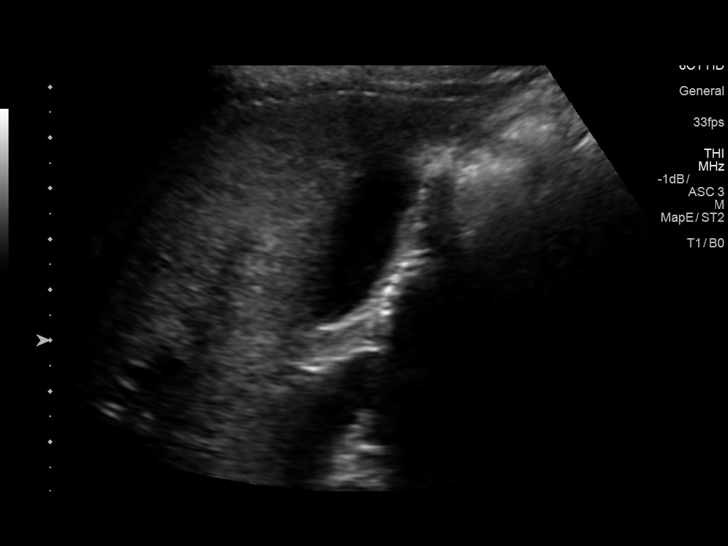
[im 10/59]
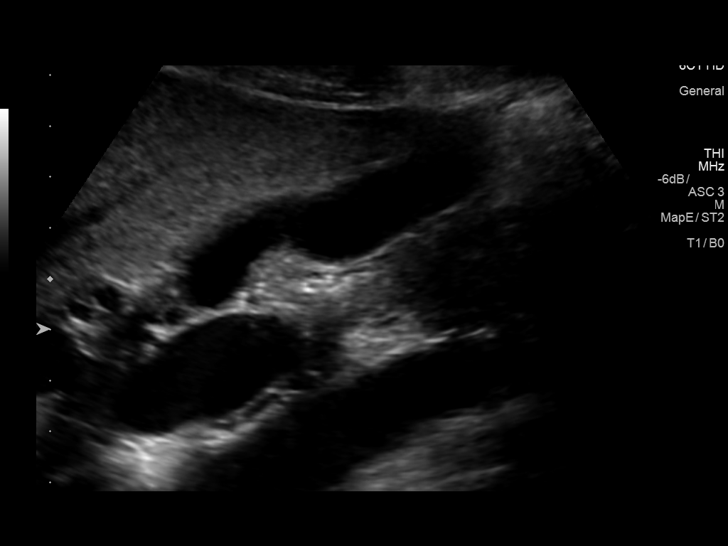
[im 15/59]
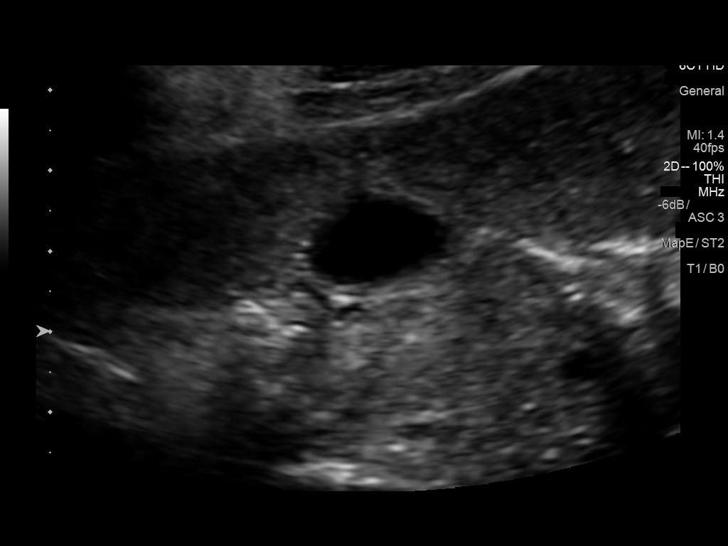
[im 20/59]
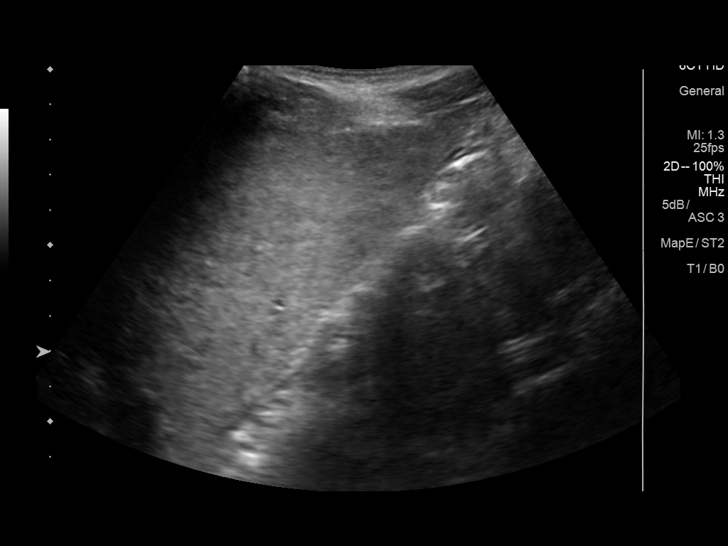
[im 22/59]
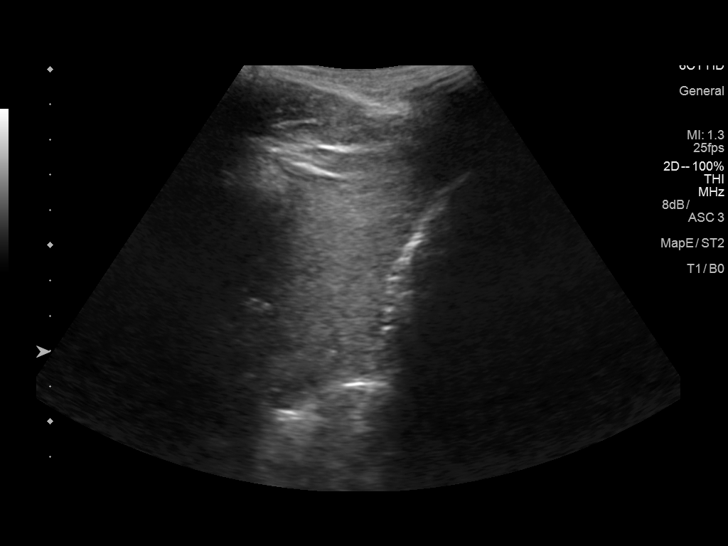
[im 27/59]
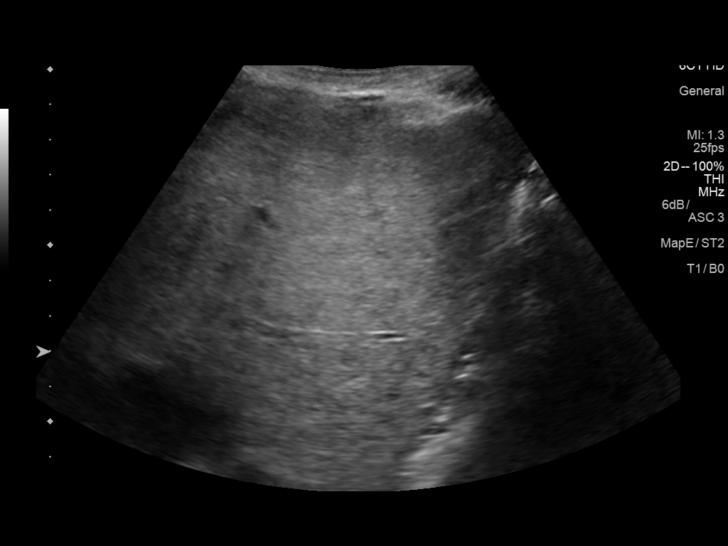
[im 32/59]
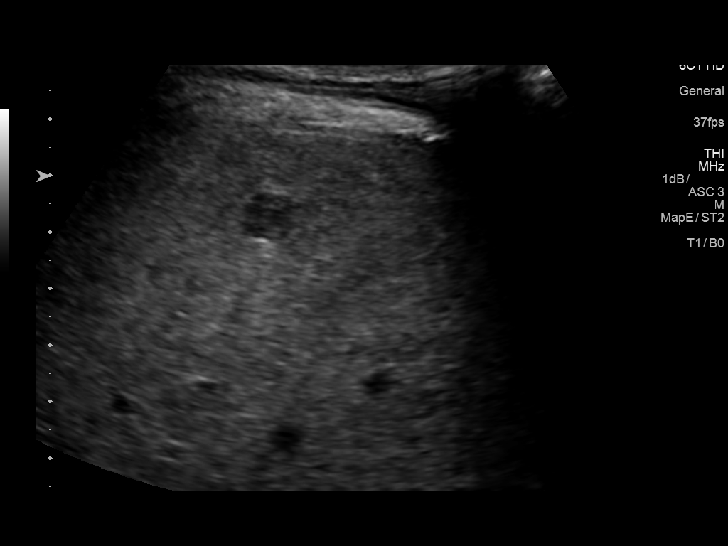
[im 37/59]
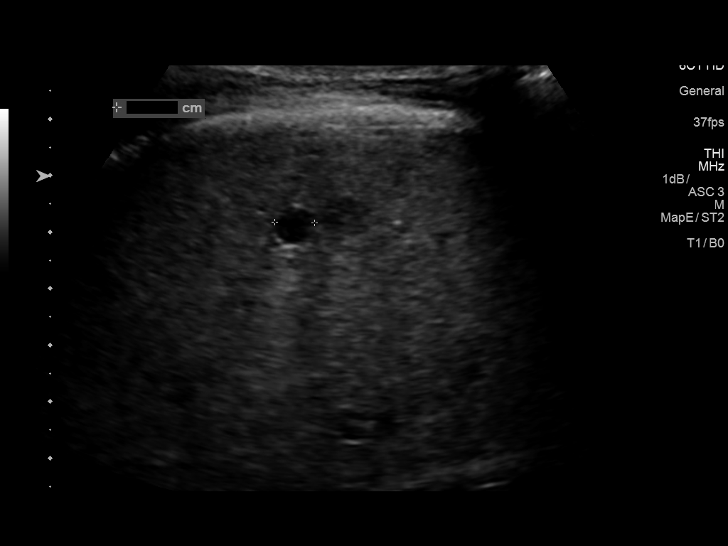
[im 39/59]
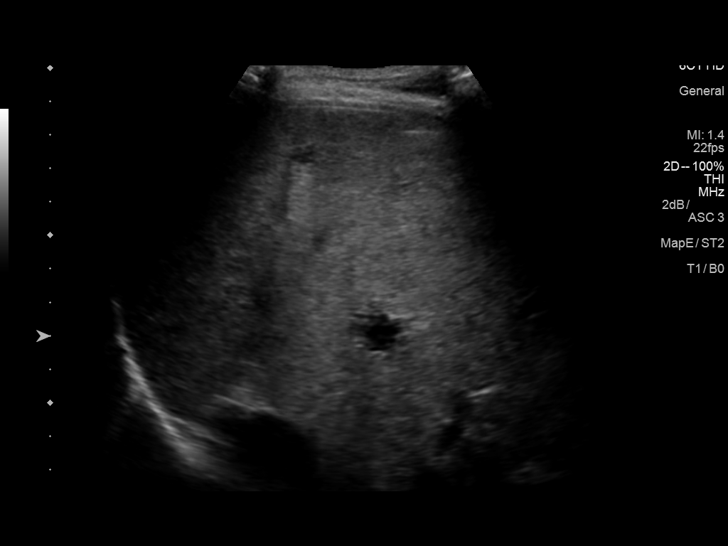
[im 44/59]
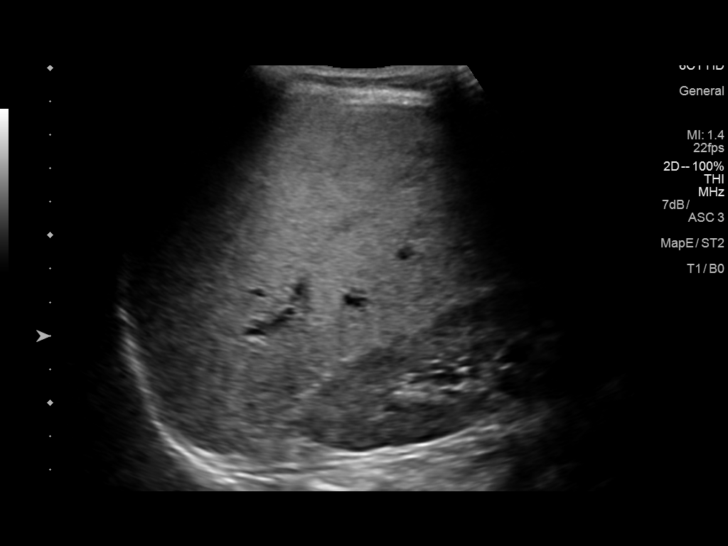
[im 49/59]
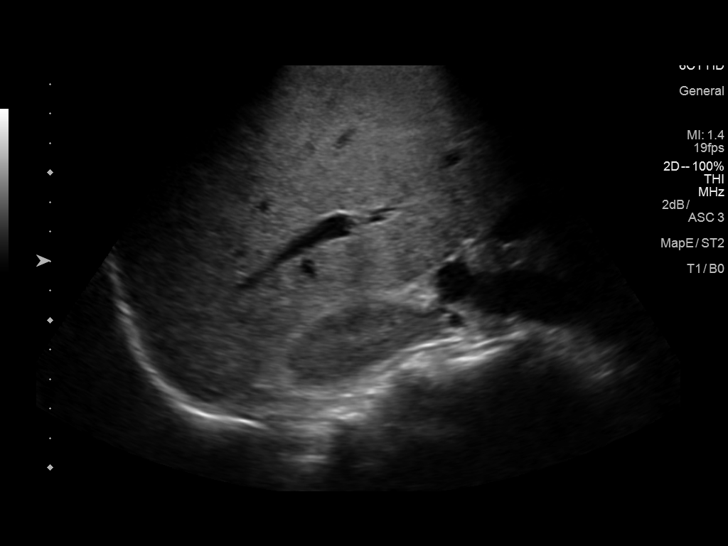
[im 54/59]
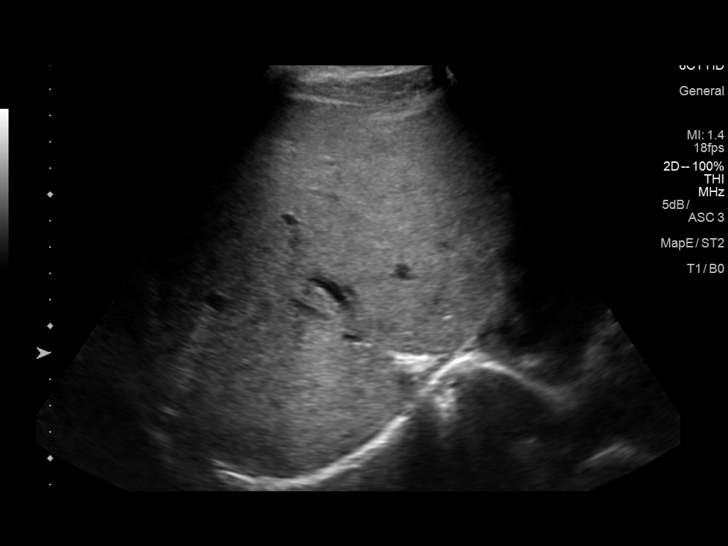
[im 59/59]
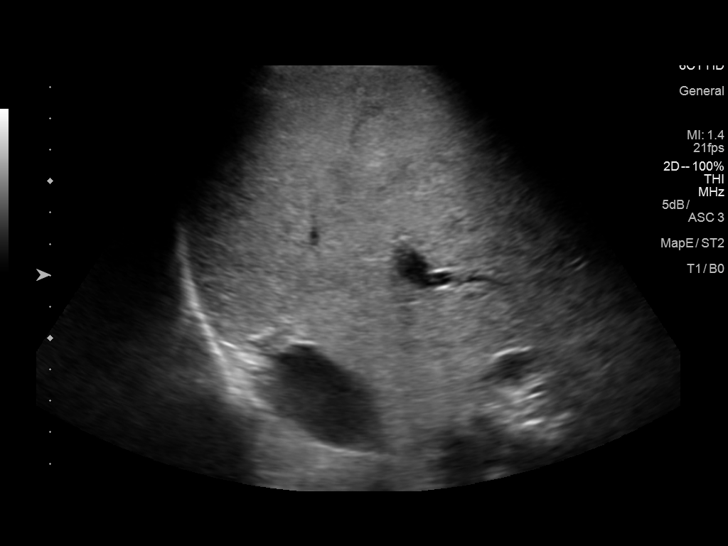

[14 of 25 positions shown; findings below may reference images not displayed]

FINDINGS: Gallbladder:

No gallstones or wall thickening visualized. No sonographic Murphy
sign noted by sonographer.

Common bile duct:

Diameter: 4.2 mm

Liver:

Diffuse increased echogenicity with coarse echotexture likely
related to the patient's cirrhosis. There are 2 simple appearing
right hepatic lobe cysts both measuring less than 1 cm and stable
when compared to prior study.

There is also a 9 x 9 x 9 mm solid-appearing nodule in the right
lobe. On the previous study this measured 8 x 8 x 7 mm. Portal vein
is patent on color Doppler imaging with normal direction of blood
flow towards the liver.
IMPRESSION: 1. Stable cirrhotic changes involving the liver with coarse
increased echogenicity.
2. Two small simple appearing right hepatic lobe cyst appears
stable.
3. 9 x 9 x 9 mm solid nodule in the right lobe previously measured 8
x 8 x 7 mm. Recommend close follow-up with a repeat ultrasound
examination in 4-6 months or MRI abdomen without and with contrast.

## 2019-12-08 ENCOUNTER — Other Ambulatory Visit: Payer: Self-pay

## 2019-12-08 ENCOUNTER — Ambulatory Visit
Admission: RE | Admit: 2019-12-08 | Discharge: 2019-12-08 | Disposition: A | Discharge status: Home / Self Care | Payer: Commercial Managed Care - PPO | Source: Ambulatory Visit | Attending: Nurse Practitioner | Admitting: Nurse Practitioner

## 2019-12-08 DIAGNOSIS — K7400 Hepatic fibrosis, unspecified: Secondary | ICD-10-CM

## 2019-12-08 DIAGNOSIS — K769 Liver disease, unspecified: Secondary | ICD-10-CM

## 2019-12-08 IMAGING — MR MR ABDOMEN WO/W CM
17 series · 48 of 48 positions shown · IV contrast (12ml Multihance)
Comparison: Ultrasound on [DATE]

CLINICAL DATA: Cirrhosis. Indeterminate hepatic lesion on recent
ultrasound.

EXAM:
MRI ABDOMEN WITHOUT AND WITH CONTRAST
TECHNIQUE: Multiplanar multisequence MR imaging of the abdomen was performed
both before and after the administration of intravenous contrast.
CONTRAST:  12mL MULTIHANCE GADOBENATE DIMEGLUMINE 529 MG/ML IV SOLN

[Series 3: T2 · coronal · 5.0mm · 1.56mm/px · 1 of 36 slices shown (1 of 3)]
[im 1/36]
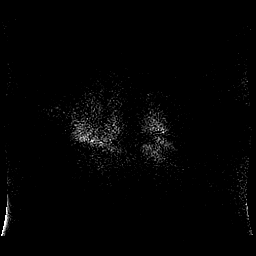

[Series 4: T1 · axial · 3.0mm · 1.19mm/px · z∈[-175,+86]mm · 6 of 176 slices shown]
[im 1/176]
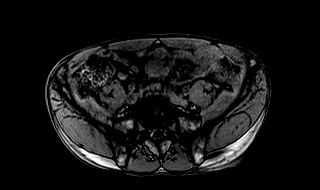
[im 36/176]
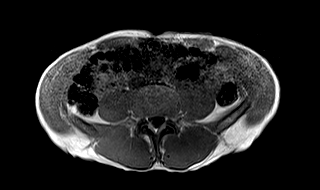
[im 71/176]
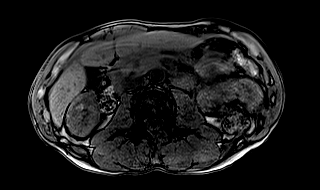
[im 106/176]
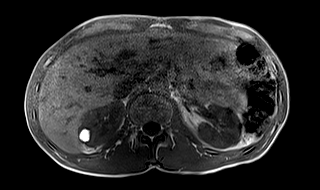
[im 141/176]
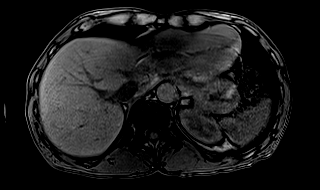
[im 176/176]
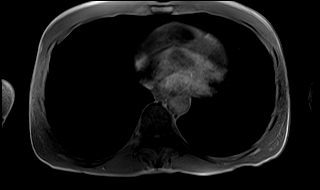

[Series 5: T2 · axial · 5.0mm · 1.48mm/px · 1 of 43 slices shown (2 of 3)]
[im 1/43]
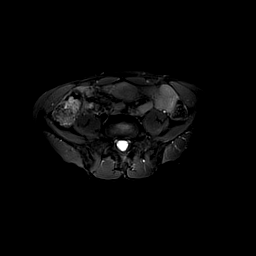

[Series 6: DWI · axial · 5.0mm · 1.42mm/px · z∈[-152,+100]mm · 5 of 129 slices shown (1 of 2)]
[im 1/129]
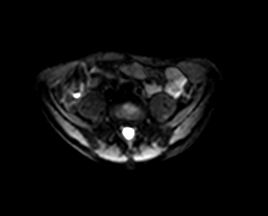
[im 33/129]
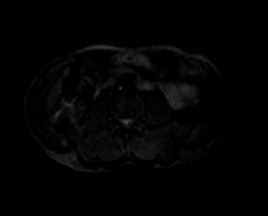
[im 65/129]
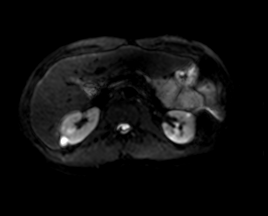
[im 97/129]
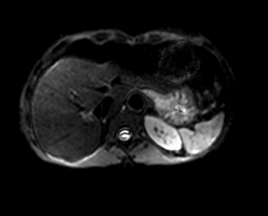
[im 129/129]
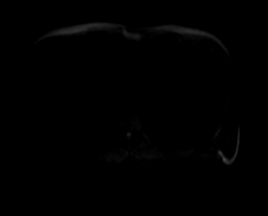

[Series 7: DWI · axial · 5.0mm · 1.42mm/px · z∈[-152,+100]mm · 2 of 43 slices shown (2 of 2)]
[im 1/43]
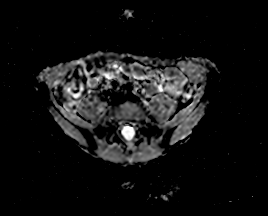
[im 43/43]
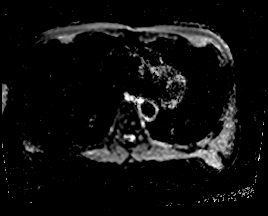

[Series 8: T2 · axial · 6.0mm · 1.19mm/px · 1 of 37 slices shown (3 of 3)]
[im 1/37]
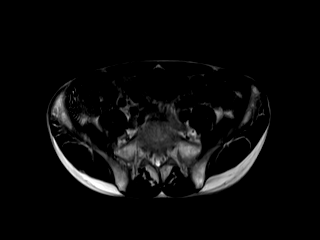

[Series 9: bSSFP · axial · 5.0mm · 1.25mm/px · z∈[-164,+76]mm · 2 of 41 slices shown]
[im 1/41]
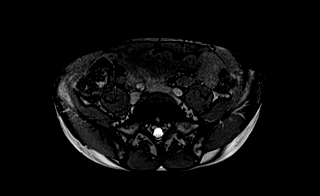
[im 41/41]
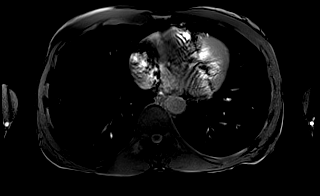

[Series 11: T1 dynamic · axial · non-contrast · 3.0mm · 1.25mm/px · z∈[-157,+80]mm · 3 of 80 slices shown]
[im 1/80]
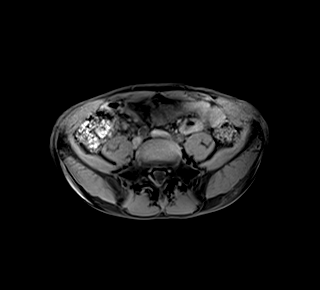
[im 40/80]
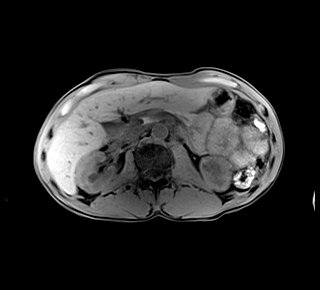
[im 80/80]
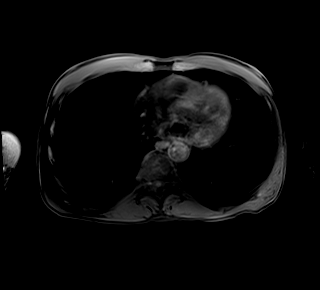

[Series 12: T1 dynamic post-contrast · axial · 3.0mm · 1.25mm/px · z∈[-157,+80]mm · 3 of 80 slices shown (1 of 9)]
[im 1/80]
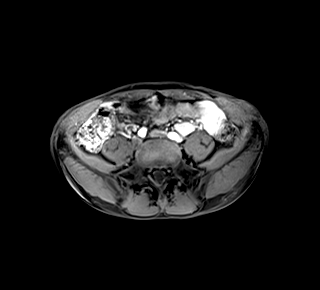
[im 40/80]
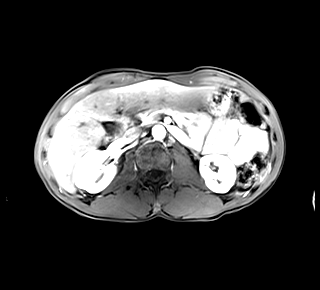
[im 80/80]
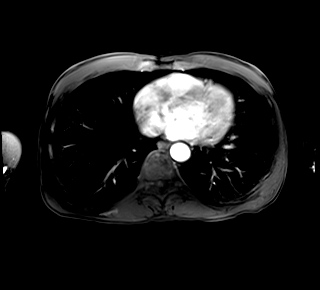

[Series 13: T1 dynamic post-contrast · axial · 3.0mm · 1.25mm/px · z∈[-157,+80]mm · 3 of 80 slices shown (2 of 9)]
[im 1/80]
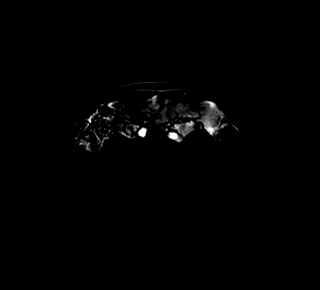
[im 40/80]
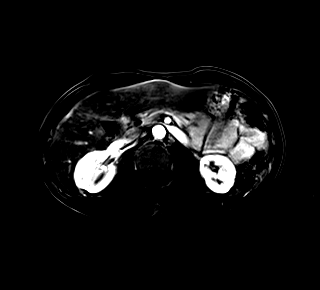
[im 80/80]
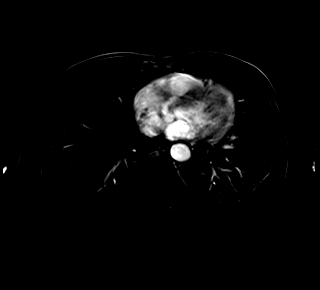

[Series 14: T1 dynamic post-contrast · axial · 3.0mm · 1.25mm/px · z∈[-157,+80]mm · 3 of 80 slices shown (3 of 9)]
[im 1/80]
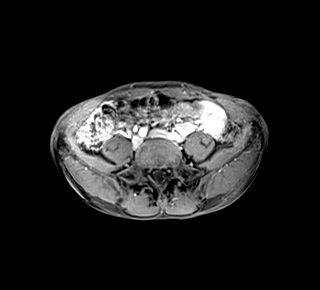
[im 40/80]
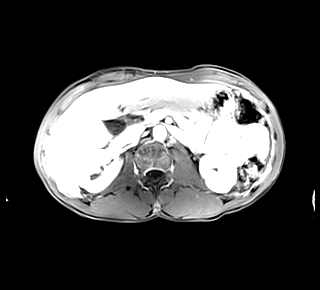
[im 80/80]
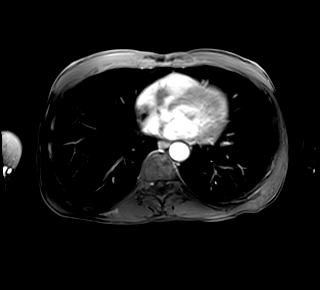

[Series 15: T1 dynamic post-contrast · axial · 3.0mm · 1.25mm/px · z∈[-157,+80]mm · 3 of 80 slices shown (4 of 9)]
[im 1/80]
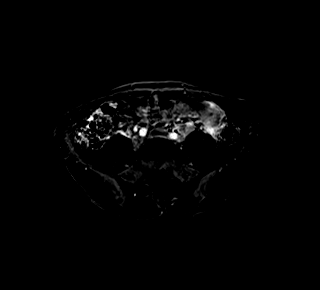
[im 40/80]
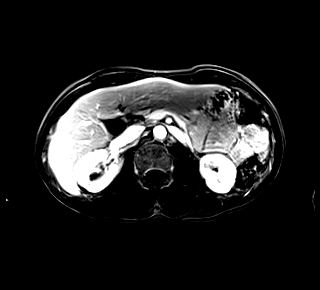
[im 80/80]
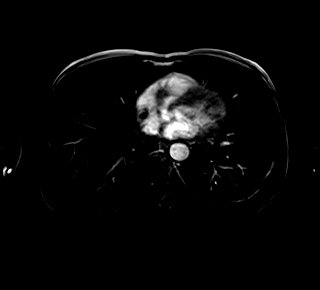

[Series 16: T1 dynamic post-contrast · axial · 3.0mm · 1.25mm/px · z∈[-157,+80]mm · 3 of 80 slices shown (5 of 9)]
[im 1/80]
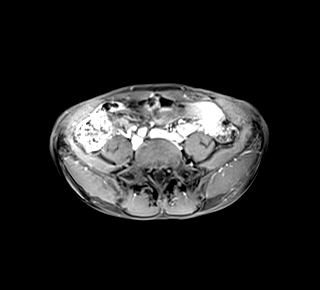
[im 40/80]
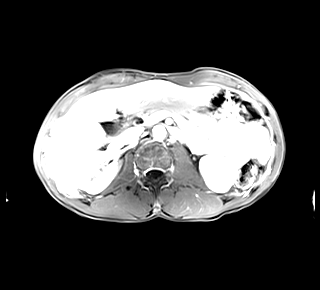
[im 80/80]
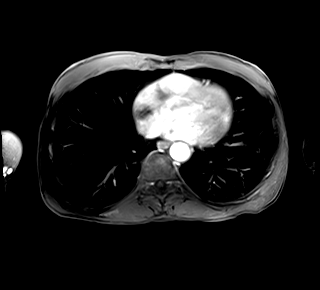

[Series 17: T1 dynamic post-contrast · axial · 3.0mm · 1.25mm/px · z∈[-157,+80]mm · 3 of 80 slices shown (6 of 9)]
[im 1/80]
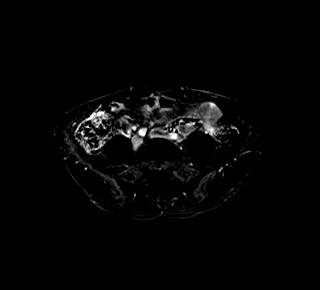
[im 40/80]
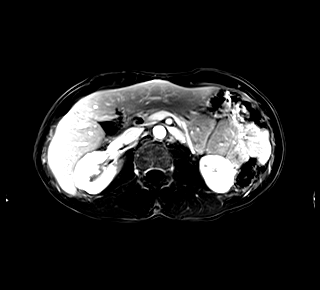
[im 80/80]
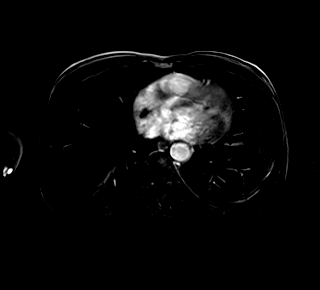

[Series 18: T1 dynamic post-contrast · coronal · 3.0mm · 1.25mm/px · 3 of 72 slices shown (7 of 9)]
[im 1/72]
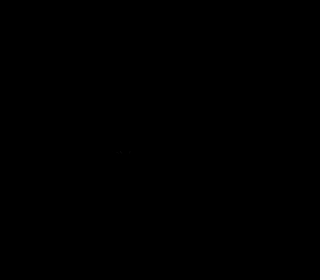
[im 36/72]
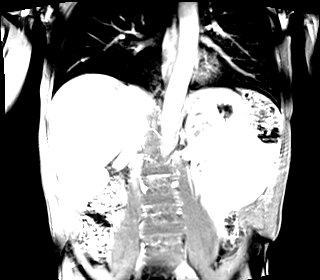
[im 72/72]
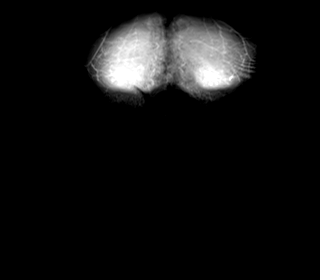

[Series 19: T1 dynamic post-contrast · axial · 3.0mm · 1.25mm/px · z∈[-157,+80]mm · 3 of 80 slices shown (8 of 9)]
[im 1/80]
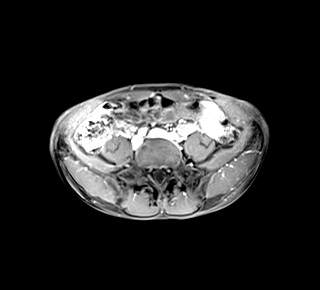
[im 40/80]
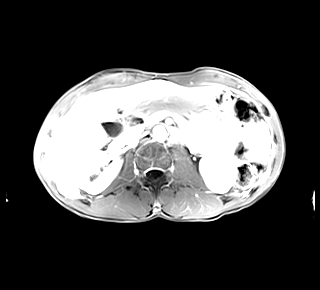
[im 80/80]
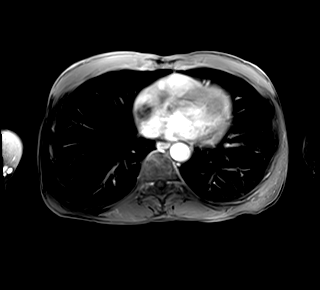

[Series 20: T1 dynamic post-contrast · axial · 3.0mm · 1.25mm/px · z∈[-157,+80]mm · 3 of 80 slices shown (9 of 9)]
[im 1/80]
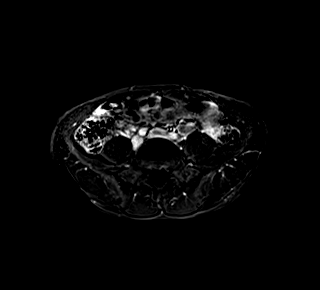
[im 40/80]
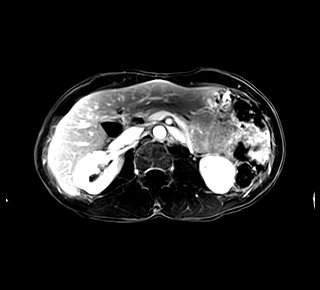
[im 80/80]
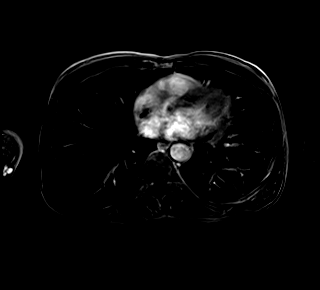

[48 of 48 positions shown; findings below may reference images not displayed]

FINDINGS: Lower chest: No acute findings.

Hepatobiliary: 2 sub-cm cysts are seen in the right hepatic lobe. In
addition, there are 2 lesions in the peripheral posterior segment of
the right lobe which show arterial phase hyperenhancement, central
contrast washout, and delayed peripheral rim enhancement (images 24
and 31/series 12). These measure 11 mm and 10 mm in size, and have
characteristics which are diagnostic of hepatocellular carcinoma in
the setting of cirrhosis.

Gallbladder is unremarkable. No evidence of biliary ductal
dilatation.

Pancreas:  No mass or inflammatory changes.

Spleen:  Within normal limits in size and appearance.

Adrenals/Urinary Tract: No masses identified. Multiple small benign
Bosniak category 1 and 2 renal cysts are seen bilaterally. No
evidence of hydronephrosis.

Stomach/Bowel: Visualized portion unremarkable.

Vascular/Lymphatic: No pathologically enlarged lymph nodes
identified. No abdominal aortic aneurysm.

Other:  None.

Musculoskeletal:  No suspicious bone lesions identified.
IMPRESSION: Two small masses in the right hepatic lobe which have
characteristics diagnostic for hepatocellular carcinoma in the
setting of cirrhosis. (NORIHISA Category 5: Definitely HCC)

No evidence of abdominal metastatic disease or other significant
abnormality.

## 2019-12-08 MED ORDER — GADOBENATE DIMEGLUMINE 529 MG/ML IV SOLN
12.0000 mL | Freq: Once | INTRAVENOUS | Status: AC | PRN
  Administered 2019-12-08: 09:00:00 12 mL via INTRAVENOUS

## 2020-01-19 ENCOUNTER — Other Ambulatory Visit: Payer: Self-pay | Admitting: Nurse Practitioner

## 2020-01-19 DIAGNOSIS — K769 Liver disease, unspecified: Secondary | ICD-10-CM

## 2020-01-25 ENCOUNTER — Encounter: Payer: Self-pay | Admitting: *Deleted

## 2020-01-25 ENCOUNTER — Other Ambulatory Visit: Payer: Self-pay

## 2020-01-25 ENCOUNTER — Other Ambulatory Visit: Payer: Self-pay | Admitting: Interventional Radiology

## 2020-01-25 ENCOUNTER — Ambulatory Visit
Admission: RE | Admit: 2020-01-25 | Discharge: 2020-01-25 | Disposition: A | Discharge status: Home / Self Care | Payer: Commercial Managed Care - PPO | Source: Ambulatory Visit | Attending: Nurse Practitioner | Admitting: Nurse Practitioner

## 2020-01-25 DIAGNOSIS — K769 Liver disease, unspecified: Secondary | ICD-10-CM

## 2020-01-25 HISTORY — PX: IR RADIOLOGIST EVAL & MGMT: IMG5224

## 2020-01-25 NOTE — Consult Note (Addendum)
Chief Complaint: Patient was consulted remotely today (TeleHealth) for hepatocellular carcinoma at the request of Drazek,Dawn.    Referring Physician(s): Drazek,Dawn  History of Present Illness: Corey Armstrong is a 64 y.o. male with a history of hepatitis C now status post 12-week treatment with Epclusa with a sustained virologic response and cure.  Prior to his treatment, he was confirmed to have F4 fibrosis.  Surveillance imaging demonstrated a small indeterminate solid lesion measuring 8 mm in October 2019.  Follow-up ultrasound in July 2020 demonstrated slight interval enlargement to 9 mm.  Dedicated MRI with Eovist contrast performed 12/08/2019 demonstrated 2 small lesions in the periphery of the right hepatic lobe which demonstrated imaging characteristics consistent with BI-RADS Category 5 definitive hepatocellular carcinoma.  The lesions measure 11 and 10 mm respectively.  Today, I was able to teleconference with Corey Armstrong over the telephone.  He was at work but able to step out to take my call.  I reviewed his imaging findings with him and confirmed that he does indeed have lesions consistent with hepatocellular carcinoma.  We discussed potential treatment strategies including liver transplant, surgical resection and percutaneous thermal ablation.  He is currently in his usual state of health.  He is asymptomatic in regard to his small liver lesions.  He denies abdominal pain, unintentional weight loss, nausea, vomiting or other systemic symptoms.  Past Medical History:  Diagnosis Date  . Murmur, heart 1963    No past surgical history on file.  Allergies: Patient has no known allergies.  Medications: Prior to Admission medications   Not on File     Family History  Problem Relation Age of Onset  . Arthritis Mother   . Cancer Neg Hx   . Diabetes Neg Hx   . Drug abuse Neg Hx   . Heart disease Neg Hx   . Hyperlipidemia Neg Hx   . Hypertension Neg Hx   . Kidney  disease Neg Hx   . Stroke Neg Hx     Social History   Socioeconomic History  . Marital status: Married    Spouse name: Not on file  . Number of children: Not on file  . Years of education: Not on file  . Highest education level: Not on file  Occupational History  . Not on file  Tobacco Use  . Smoking status: Current Some Day Smoker    Types: Cigars  . Smokeless tobacco: Never Used  . Tobacco comment: 3 per week  Substance and Sexual Activity  . Alcohol use: No  . Drug use: No  . Sexual activity: Not Currently  Other Topics Concern  . Not on file  Social History Narrative  . Not on file   Social Determinants of Health   Financial Resource Strain:   . Difficulty of Paying Living Expenses:   Food Insecurity:   . Worried About Charity fundraiser in the Last Year:   . Arboriculturist in the Last Year:   Transportation Needs:   . Film/video editor (Medical):   Marland Kitchen Lack of Transportation (Non-Medical):   Physical Activity:   . Days of Exercise per Week:   . Minutes of Exercise per Session:   Stress:   . Feeling of Stress :   Social Connections:   . Frequency of Communication with Friends and Family:   . Frequency of Social Gatherings with Friends and Family:   . Attends Religious Services:   . Active Member of Clubs or Organizations:   .  Attends Archivist Meetings:   Marland Kitchen Marital Status:     ECOG Status: 0 - Asymptomatic  Review of Systems  Review of Systems: A 12 point ROS discussed and pertinent positives are indicated in the HPI above.  All other systems are negative.  Physical Exam No direct physical exam was performed (except for noted visual exam findings with Video Visits).   Vital Signs: There were no vitals taken for this visit.  Imaging: No results found.  Labs:  CBC: No results for input(s): WBC, HGB, HCT, PLT in the last 8760 hours.  COAGS: No results for input(s): INR, APTT in the last 8760 hours.  BMP: No results for  input(s): NA, K, CL, CO2, GLUCOSE, BUN, CALCIUM, CREATININE, GFRNONAA, GFRAA in the last 8760 hours.  Invalid input(s): CMP  LIVER FUNCTION TESTS: No results for input(s): BILITOT, AST, ALT, ALKPHOS, PROT, ALBUMIN in the last 8760 hours.  TUMOR MARKERS: No results for input(s): AFPTM, CEA, CA199, CHROMGRNA in the last 8760 hours.  Assessment and Plan:  Pleasant 64 year old male with a history of HCV cirrhosis.  His HCV has been successfully treated, however surveillance imaging demonstrates 2 small (1 cm) lesions with imaging characteristics diagnostic for hepatocellular carcinoma (LI RADS 5).  We discussed potential treatment strategies including liver transplantation, hepatic resection and percutaneous thermal ablation.  Given that his lesions are visible by ultrasound and relatively peripheral in location, he would be an excellent candidate for percutaneous thermal ablation which would have a high likelihood of local regional control.  Furthermore, percutaneous thermal ablation would not prevent him from undergoing a liver transplantation in the future.  I was able to answer his questions.  At this time, he is interested in pursuing percutaneous thermal ablation.  I will get him scheduled to undergo microwave ablation of 2 lesions to be performed at [facility].  1.)  Please schedule for percutaneous thermal ablation of hepatocellular carcinoma at [facility] as soon as available.  Please make sure that the schedulers have Mr. Belaire significant other's contact information (Corey Armstrong [phone]) as he cannot answer the phone while at work.  Thank you for this interesting consult.  I greatly enjoyed meeting Apolo Sharer and look forward to participating in their care.  A copy of this report was sent to the requesting provider on this date.  Electronically Signed: Jacqulynn Cadet 01/25/2020, 8:32 AM   I spent a total of  30 Minutes in remote  clinical  consultation, greater than 50% of which was counseling/coordinating care for hepatocellular carcinoma.  Visit type: Audio only (telephone). Audio (no video) only due to patient request. Alternative for in-person consultation at [facility], [address] [place], [place], [place]. This visit type was conducted due to national recommendations for restrictions regarding the COVID-19 Pandemic (e.g. social distancing).  This format is felt to be most appropriate for this patient at this time.  All issues noted in this document were discussed and addressed.

## 2020-01-27 ENCOUNTER — Other Ambulatory Visit (HOSPITAL_COMMUNITY): Payer: Self-pay | Admitting: Interventional Radiology

## 2020-01-27 DIAGNOSIS — C220 Liver cell carcinoma: Secondary | ICD-10-CM

## 2020-02-12 ENCOUNTER — Ambulatory Visit: Payer: Commercial Managed Care - PPO | Attending: Internal Medicine

## 2020-02-12 DIAGNOSIS — Z23 Encounter for immunization: Secondary | ICD-10-CM

## 2020-02-12 NOTE — Progress Notes (Signed)
   Covid-19 Vaccination Clinic  Name:  Corey Armstrong    MRN: [id number] DOB: Aug 16, 1956  02/12/2020  [name] was observed post Covid-19 immunization for 15 minutes without incident. He was provided with Vaccine Information Sheet and instruction to access the V-Safe system.   [name] was instructed to call 911 with any severe reactions post vaccine: Marland Kitchen Difficulty breathing  . Swelling of face and throat  . A fast heartbeat  . A bad rash all over body  . Dizziness and weakness   Immunizations Administered    Name Date Dose VIS Date Route   Pfizer COVID-19 Vaccine 02/12/2020  3:57 PM 0.3 mL 10/22/2019 Intramuscular   Manufacturer: [place]   Lot: [id number]   [place]: [id number]

## 2020-02-22 ENCOUNTER — Encounter (HOSPITAL_COMMUNITY): Payer: Self-pay | Admitting: Interventional Radiology

## 2020-02-22 ENCOUNTER — Other Ambulatory Visit: Payer: Self-pay | Admitting: Physician Assistant

## 2020-02-22 ENCOUNTER — Other Ambulatory Visit: Payer: Self-pay

## 2020-02-22 ENCOUNTER — Encounter (HOSPITAL_COMMUNITY): Admission: RE | Admit: 2020-02-22 | Payer: Commercial Managed Care - PPO | Source: Ambulatory Visit

## 2020-02-24 NOTE — Progress Notes (Signed)
Spoke with patient's wife via phone and appt made for patient to have covid test completed on 02/26/20.

## 2020-02-26 ENCOUNTER — Inpatient Hospital Stay (HOSPITAL_COMMUNITY)
Admission: RE | Admit: 2020-02-26 | Discharge: 2020-02-26 | Disposition: A | Discharge status: Home / Self Care | Payer: Commercial Managed Care - PPO | Source: Ambulatory Visit

## 2020-02-26 NOTE — Progress Notes (Signed)
Patient at drive thru for covid test stating he not be able to quarantine due to work schedule. Patient stated he would call the office to reschedule his procedure.

## 2020-02-28 NOTE — Progress Notes (Signed)
Patient's wife called and stated pt was unable to go for covid test on 02/26/20 due to work . Wife stated patient could have covid test completed on 02/29/20.  Order put in for patient to have covid test done on 02/29/20 approximately 1300.

## 2020-02-29 ENCOUNTER — Other Ambulatory Visit: Payer: Self-pay | Admitting: Radiology

## 2020-02-29 ENCOUNTER — Other Ambulatory Visit (HOSPITAL_COMMUNITY)
Admission: RE | Admit: 2020-02-29 | Discharge: 2020-02-29 | Disposition: A | Discharge status: Home / Self Care | Payer: Commercial Managed Care - PPO | Source: Ambulatory Visit | Attending: Interventional Radiology | Admitting: Interventional Radiology

## 2020-02-29 DIAGNOSIS — Z01812 Encounter for preprocedural laboratory examination: Secondary | ICD-10-CM | POA: Diagnosis present

## 2020-02-29 DIAGNOSIS — Z20822 Contact with and (suspected) exposure to covid-19: Secondary | ICD-10-CM | POA: Insufficient documentation

## 2020-02-29 LAB — SARS CORONAVIRUS 2 (TAT 6-24 HRS): SARS Coronavirus 2: NEGATIVE

## 2020-03-01 ENCOUNTER — Other Ambulatory Visit: Payer: Self-pay

## 2020-03-01 ENCOUNTER — Observation Stay (HOSPITAL_COMMUNITY)
Admission: RE | Admit: 2020-03-01 | Discharge: 2020-03-02 | Disposition: A | Discharge status: Home / Self Care | Payer: Commercial Managed Care - PPO | Attending: Interventional Radiology | Admitting: Interventional Radiology

## 2020-03-01 ENCOUNTER — Inpatient Hospital Stay (HOSPITAL_COMMUNITY): Admission: RE | Admit: 2020-03-01 | Payer: Commercial Managed Care - PPO | Source: Ambulatory Visit

## 2020-03-01 ENCOUNTER — Encounter (HOSPITAL_COMMUNITY)
Admission: RE | Disposition: A | Discharge status: Home / Self Care | Payer: Self-pay | Source: Home / Self Care | Attending: Interventional Radiology

## 2020-03-01 ENCOUNTER — Ambulatory Visit (HOSPITAL_COMMUNITY): Payer: Commercial Managed Care - PPO

## 2020-03-01 ENCOUNTER — Encounter (HOSPITAL_COMMUNITY): Payer: Self-pay | Admitting: Interventional Radiology

## 2020-03-01 ENCOUNTER — Encounter (HOSPITAL_COMMUNITY): Payer: Self-pay

## 2020-03-01 ENCOUNTER — Ambulatory Visit (HOSPITAL_COMMUNITY): Payer: Commercial Managed Care - PPO | Admitting: Physician Assistant

## 2020-03-01 ENCOUNTER — Ambulatory Visit (HOSPITAL_COMMUNITY)
Admission: RE | Admit: 2020-03-01 | Discharge: 2020-03-01 | Disposition: A | Discharge status: Home / Self Care | Payer: Commercial Managed Care - PPO | Source: Ambulatory Visit | Attending: Interventional Radiology | Admitting: Interventional Radiology

## 2020-03-01 ENCOUNTER — Ambulatory Visit (HOSPITAL_COMMUNITY): Payer: Commercial Managed Care - PPO | Admitting: Certified Registered Nurse Anesthetist

## 2020-03-01 DIAGNOSIS — C220 Liver cell carcinoma: Secondary | ICD-10-CM | POA: Diagnosis not present

## 2020-03-01 DIAGNOSIS — Z01818 Encounter for other preprocedural examination: Secondary | ICD-10-CM

## 2020-03-01 HISTORY — PX: RADIOLOGY WITH ANESTHESIA: SHX6223

## 2020-03-01 HISTORY — DX: Inflammatory liver disease, unspecified: K75.9

## 2020-03-01 HISTORY — DX: Malignant (primary) neoplasm, unspecified: C80.1

## 2020-03-01 LAB — TYPE AND SCREEN
ABO/RH(D): O POS
Antibody Screen: NEGATIVE

## 2020-03-01 LAB — CBC WITH DIFFERENTIAL/PLATELET
Abs Immature Granulocytes: 0.01 10*3/uL (ref 0.00–0.07)
Basophils Absolute: 0.1 10*3/uL (ref 0.0–0.1)
Basophils Relative: 1 %
Eosinophils Absolute: 0.2 10*3/uL (ref 0.0–0.5)
Eosinophils Relative: 3 %
HCT: 41.5 % (ref 39.0–52.0)
Hemoglobin: 13.3 g/dL (ref 13.0–17.0)
Immature Granulocytes: 0 %
Lymphocytes Relative: 28 %
Lymphs Abs: 1.7 10*3/uL (ref 0.7–4.0)
MCH: 31.7 pg (ref 26.0–34.0)
MCHC: 32 g/dL (ref 30.0–36.0)
MCV: 99 fL (ref 80.0–100.0)
Monocytes Absolute: 0.5 10*3/uL (ref 0.1–1.0)
Monocytes Relative: 9 %
Neutro Abs: 3.6 10*3/uL (ref 1.7–7.7)
Neutrophils Relative %: 59 %
Platelets: 247 10*3/uL (ref 150–400)
RBC: 4.19 MIL/uL — ABNORMAL LOW (ref 4.22–5.81)
RDW: 13 % (ref 11.5–15.5)
WBC: 6.1 10*3/uL (ref 4.0–10.5)
nRBC: 0 % (ref 0.0–0.2)

## 2020-03-01 LAB — COMPREHENSIVE METABOLIC PANEL
ALT: 16 U/L (ref 0–44)
AST: 20 U/L (ref 15–41)
Albumin: 4.3 g/dL (ref 3.5–5.0)
Alkaline Phosphatase: 76 U/L (ref 38–126)
Anion gap: 9 (ref 5–15)
BUN: 19 mg/dL (ref 8–23)
CO2: 24 mmol/L (ref 22–32)
Calcium: 9.6 mg/dL (ref 8.9–10.3)
Chloride: 109 mmol/L (ref 98–111)
Creatinine, Ser: 1.16 mg/dL (ref 0.61–1.24)
GFR calc Af Amer: >60 mL/min (ref >60)
GFR calc non Af Amer: >60 mL/min (ref >60)
Glucose, Bld: 100 mg/dL — ABNORMAL HIGH (ref 70–99)
Potassium: 4.4 mmol/L (ref 3.5–5.1)
Sodium: 142 mmol/L (ref 135–145)
Total Bilirubin: 0.4 mg/dL (ref 0.3–1.2)
Total Protein: 7.9 g/dL (ref 6.5–8.1)

## 2020-03-01 LAB — ABO/RH: ABO/RH(D): O POS

## 2020-03-01 LAB — PROTIME-INR
INR: 1 (ref 0.8–1.2)
Prothrombin Time: 12.6 seconds (ref 11.4–15.2)

## 2020-03-01 IMAGING — DX DG CHEST 1V
1 series · 1 of 1 positions shown · non-contrast
Comparison: None.

CLINICAL DATA: Preop for ablation.

EXAM:
CHEST  1 VIEW

[chest pa]
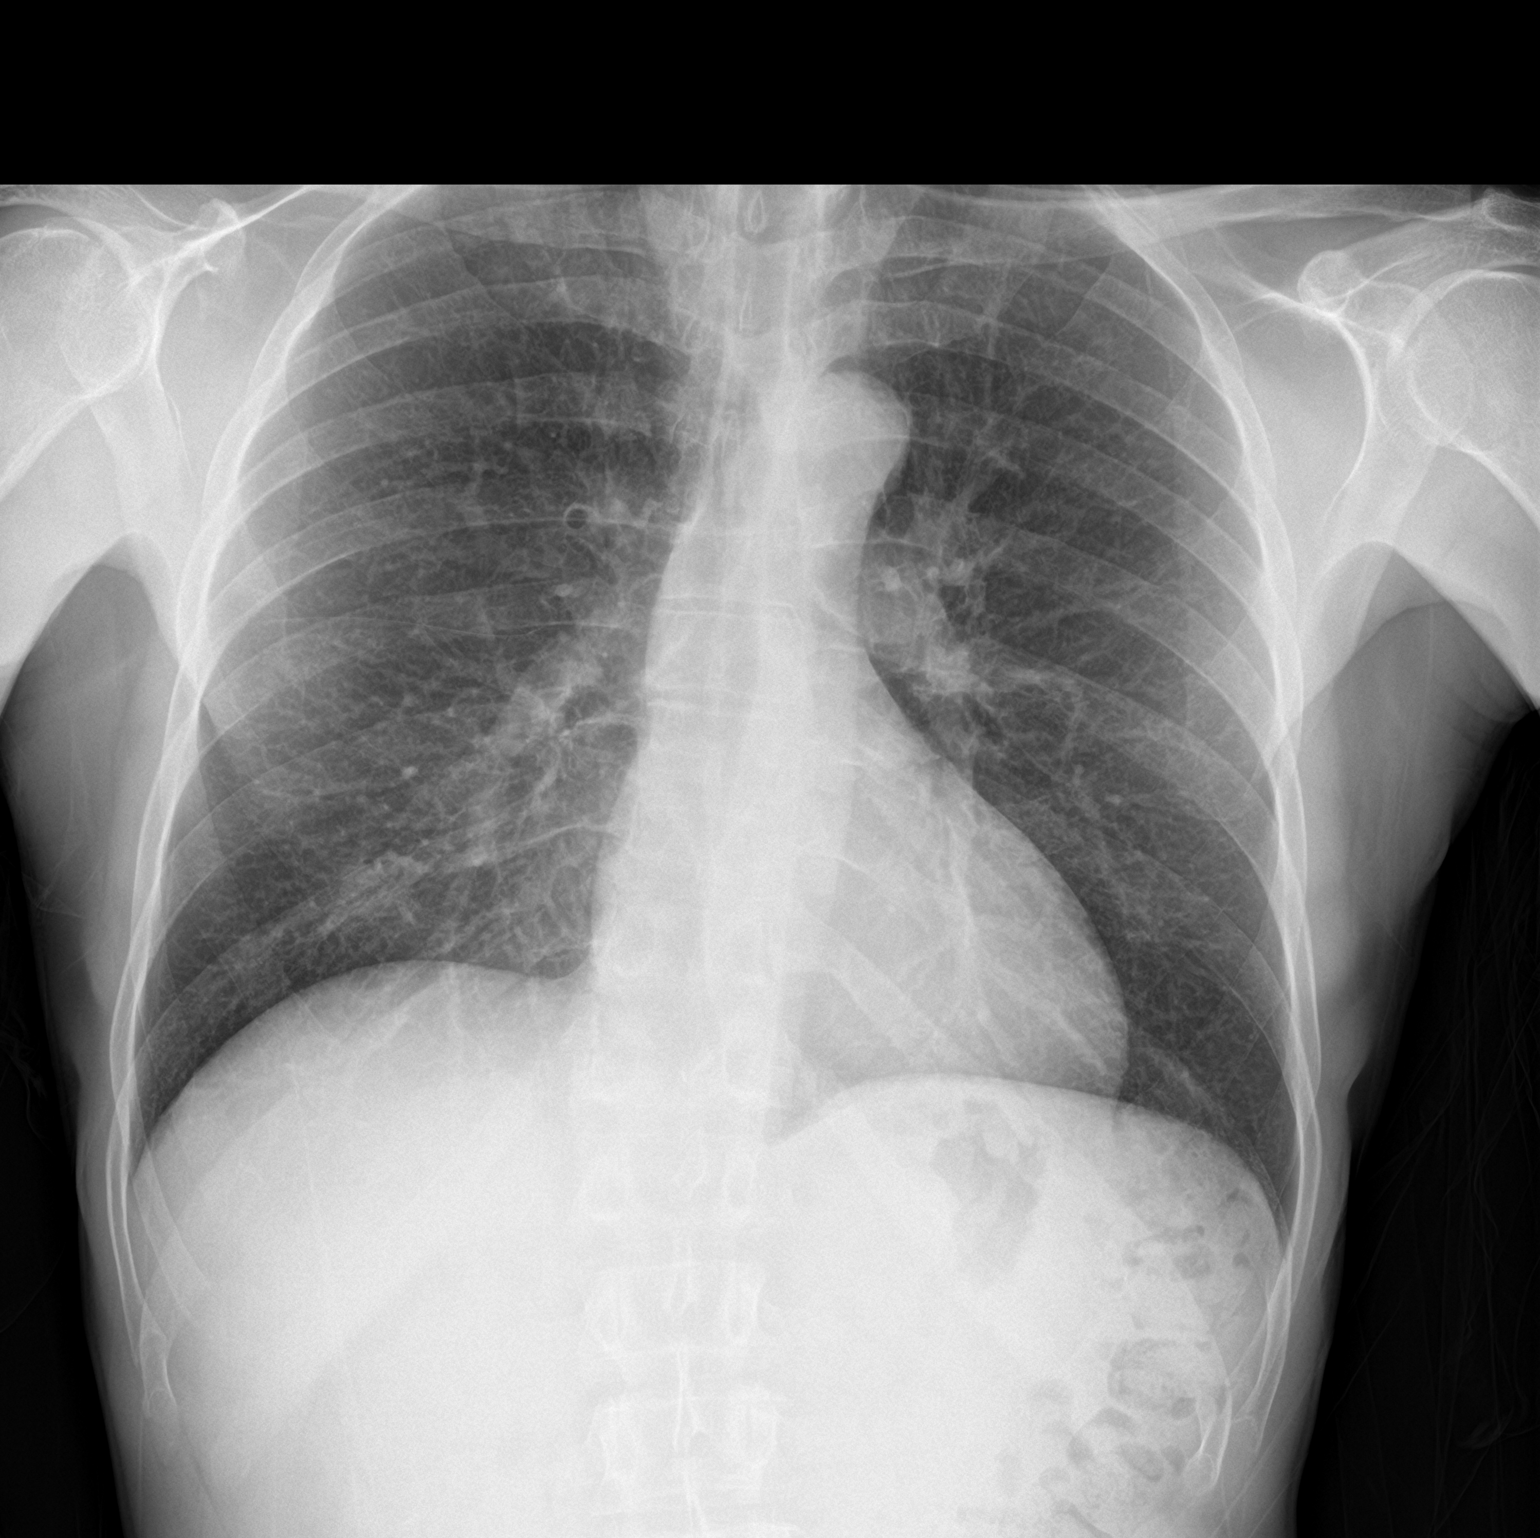

[1 of 1 positions shown; findings below may reference images not displayed]

FINDINGS: The heart size and mediastinal contours are within normal limits.
Both lungs are clear. The visualized skeletal structures are
unremarkable.
IMPRESSION: No active disease.

## 2020-03-01 SURGERY — CT WITH ANESTHESIA
Anesthesia: General

## 2020-03-01 MED ORDER — PHENYLEPHRINE 40 MCG/ML (10ML) SYRINGE FOR IV PUSH (FOR BLOOD PRESSURE SUPPORT)
PREFILLED_SYRINGE | INTRAVENOUS | Status: DC | PRN
  Administered 2020-03-01: 80 ug via INTRAVENOUS
  Administered 2020-03-01: 120 ug via INTRAVENOUS
  Administered 2020-03-01: 80 ug via INTRAVENOUS
  Administered 2020-03-01: 120 ug via INTRAVENOUS
  Administered 2020-03-01: 80 ug via INTRAVENOUS

## 2020-03-01 MED ORDER — IOHEXOL 350 MG/ML SOLN
50.0000 mL | Freq: Once | INTRAVENOUS | Status: AC | PRN
  Administered 2020-03-01: 50 mL via INTRAVENOUS

## 2020-03-01 MED ORDER — FENTANYL CITRATE (PF) 100 MCG/2ML IJ SOLN: 25.0000 ug | INTRAMUSCULAR | Status: DC | PRN

## 2020-03-01 MED ORDER — SODIUM CHLORIDE (PF) 0.9 % IJ SOLN
INTRAMUSCULAR | Status: AC
  Filled 2020-03-01: qty 100

## 2020-03-01 MED ORDER — DEXAMETHASONE SODIUM PHOSPHATE 10 MG/ML IJ SOLN
INTRAMUSCULAR | Status: DC | PRN
  Administered 2020-03-01: 5 mg via INTRAVENOUS

## 2020-03-01 MED ORDER — ONDANSETRON HCL 4 MG/2ML IJ SOLN
INTRAMUSCULAR | Status: DC | PRN
  Administered 2020-03-01: 4 mg via INTRAVENOUS

## 2020-03-01 MED ORDER — EPHEDRINE SULFATE-NACL 50-0.9 MG/10ML-% IV SOSY
PREFILLED_SYRINGE | INTRAVENOUS | Status: DC | PRN
  Administered 2020-03-01: 10 mg via INTRAVENOUS
  Administered 2020-03-01: 15 mg via INTRAVENOUS
  Administered 2020-03-01: 10 mg via INTRAVENOUS

## 2020-03-01 MED ORDER — FENTANYL CITRATE (PF) 250 MCG/5ML IJ SOLN
INTRAMUSCULAR | Status: DC | PRN
  Administered 2020-03-01 (×3): 50 ug via INTRAVENOUS
  Administered 2020-03-01: 25 ug via INTRAVENOUS

## 2020-03-01 MED ORDER — PIPERACILLIN-TAZOBACTAM 3.375 G IVPB
3.3750 g | Freq: Once | INTRAVENOUS | Status: AC
  Administered 2020-03-01: 3.375 g via INTRAVENOUS
  Filled 2020-03-01: qty 50

## 2020-03-01 MED ORDER — KETOROLAC TROMETHAMINE 30 MG/ML IJ SOLN
INTRAMUSCULAR | Status: DC | PRN
  Administered 2020-03-01: 30 mg via INTRAMUSCULAR

## 2020-03-01 MED ORDER — LACTATED RINGERS IV SOLN: INTRAVENOUS | Status: DC

## 2020-03-01 MED ORDER — OXYCODONE HCL 5 MG PO TABS
5.0000 mg | ORAL_TABLET | ORAL | Status: DC | PRN
  Administered 2020-03-01 (×2): 5 mg via ORAL
  Filled 2020-03-01 (×2): qty 1

## 2020-03-01 MED ORDER — MIDAZOLAM HCL 2 MG/2ML IJ SOLN
INTRAMUSCULAR | Status: AC
  Filled 2020-03-01: qty 2

## 2020-03-01 MED ORDER — SUGAMMADEX SODIUM 200 MG/2ML IV SOLN
INTRAVENOUS | Status: DC | PRN
  Administered 2020-03-01: 200 mg via INTRAVENOUS

## 2020-03-01 MED ORDER — SENNOSIDES-DOCUSATE SODIUM 8.6-50 MG PO TABS: 1.0000 | ORAL_TABLET | Freq: Every evening | ORAL | Status: DC | PRN

## 2020-03-01 MED ORDER — LIDOCAINE 2% (20 MG/ML) 5 ML SYRINGE
INTRAMUSCULAR | Status: DC | PRN
  Administered 2020-03-01: 40 mg via INTRAVENOUS

## 2020-03-01 MED ORDER — MIDAZOLAM HCL 5 MG/5ML IJ SOLN
INTRAMUSCULAR | Status: DC | PRN
  Administered 2020-03-01: 2 mg via INTRAVENOUS

## 2020-03-01 MED ORDER — ROCURONIUM BROMIDE 50 MG/5ML IV SOSY
PREFILLED_SYRINGE | INTRAVENOUS | Status: DC | PRN
  Administered 2020-03-01: 60 mg via INTRAVENOUS
  Administered 2020-03-01: 40 mg via INTRAVENOUS

## 2020-03-01 MED ORDER — PHENYLEPHRINE HCL-NACL 10-0.9 MG/250ML-% IV SOLN
INTRAVENOUS | Status: DC | PRN
  Administered 2020-03-01: 50 ug/min via INTRAVENOUS

## 2020-03-01 MED ORDER — PROPOFOL 10 MG/ML IV BOLUS
INTRAVENOUS | Status: DC | PRN
  Administered 2020-03-01: 100 mg via INTRAVENOUS

## 2020-03-01 MED ORDER — DOCUSATE SODIUM 100 MG PO CAPS
100.0000 mg | ORAL_CAPSULE | Freq: Two times a day (BID) | ORAL | Status: DC
  Administered 2020-03-02: 10:00:00 100 mg via ORAL
  Filled 2020-03-01: qty 1

## 2020-03-01 MED ORDER — ONDANSETRON HCL 4 MG/2ML IJ SOLN: 4.0000 mg | Freq: Four times a day (QID) | INTRAMUSCULAR | Status: DC | PRN

## 2020-03-01 MED ORDER — FENTANYL CITRATE (PF) 250 MCG/5ML IJ SOLN
INTRAMUSCULAR | Status: AC
  Filled 2020-03-01: qty 5

## 2020-03-01 MED ORDER — SODIUM CHLORIDE (PF) 0.9 % IJ SOLN
INTRAMUSCULAR | Status: AC
  Filled 2020-03-01: qty 150

## 2020-03-01 NOTE — Anesthesia Procedure Notes (Signed)
Procedure Name: Intubation Date/Time: 03/01/2020 9:07 AM Performed by: West Pugh, CRNA Pre-anesthesia Checklist: Patient identified, Emergency Drugs available, Suction available, Patient being monitored and Timeout performed Patient Re-evaluated:Patient Re-evaluated prior to induction Oxygen Delivery Method: Circle system utilized Preoxygenation: Pre-oxygenation with 100% oxygen Induction Type: IV induction Ventilation: Mask ventilation without difficulty Laryngoscope Size: Mac and 3 Grade View: Grade I Tube type: Oral Tube size: 7.5 mm Number of attempts: 1 Airway Equipment and Method: Stylet Placement Confirmation: ETT inserted through vocal cords under direct vision,  positive ETCO2,  CO2 detector and breath sounds checked- equal and bilateral Secured at: 21 cm Tube secured with: Tape Dental Injury: Teeth and Oropharynx as per pre-operative assessment

## 2020-03-01 NOTE — Procedures (Signed)
Interventional Radiology Procedure Note  Procedure: Percutaneous thermal ablation of liver lesions.    Complications: None.   Estimated Blood Loss: None.   Recommendations: - Admit for observation   Signed,  Criselda Peaches, MD

## 2020-03-01 NOTE — Sedation Documentation (Signed)
Anesthesia in to sedate and monitor. 

## 2020-03-01 NOTE — H&P (Signed)
Referring Physician(s): Drazek,D  Supervising Physician: Jacqulynn Cadet  Patient Status:  [facility] OP TBA  Chief Complaint: Hepatocellular carcinoma - two liver lesions.     Subjective: 64 year old male with a history of hepatitis C, status post 12-week treatment with Epclusa with a sustained virologic response and cure. Surveillance imaging demonstrated a small indeterminate solid lesion measuring 8 mm in October 2019.  Follow-up ultrasound in July 2020 demonstrated slight interval enlargement to 9 mm.  Dedicated MRI with Eovist contrast performed 12/08/2019 demonstrated 2 small lesions in the periphery of the right hepatic lobe which demonstrated imaging characteristics consistent with BI-RADS Category 5 definitive hepatocellular carcinoma.  The lesions measure 11 and 10 mm respectively.  Patient met with Dr. Jacqulynn Cadet via a virtual visit on 01/25/2020 and was deemed an appropriate candidate for the microwave ablation procedure. He presents today for this procedure.  Patient currently denies fever, chills, headache, nausea/vomiting, abdominal/back pain, malaise or bleeding.  Past Medical History:  Diagnosis Date  . Cancer ([place])   . Hepatitis    HX OF hEP c ?   . Murmur, heart 1963   Past Surgical History:  Procedure Laterality Date  . IR RADIOLOGIST EVAL & MGMT  01/25/2020    Allergies: Patient has no known allergies.  Medications: Prior to Admission medications   Medication Sig Start Date End Date Taking? Authorizing Provider  cetirizine (ZYRTEC) 10 MG tablet Take 10 mg by mouth daily as needed for allergies.   Yes [provider]  loratadine (CLARITIN) 10 MG tablet Take 10 mg by mouth daily as needed for allergies.   Yes [provider]     Vital Signs: BP (!) 168/71 (BP Location: Right Arm)   Pulse (!) 59   Temp 98.2 F (36.8 C) (Oral)   Resp 18   Ht 5' 8" (1.727 m)   Wt 132 lb 3.2 oz (60 kg)   SpO2 100%   BMI 20.10 kg/m   Physical  Exam  HENT: edentulous, non-icteric Cardiac: Regular rate and rhythm, Grade 2 murmur. Pulses strong and equal in all extremities. No edema.  Respiratory: Clear bilaterally, relaxed work of breathing.  Neuro: Alert and oriented x4, appropriate mood and affect.   Imaging: No results found.  Labs:  CBC: Recent Labs    03/01/20 0741  WBC 6.1  HGB 13.3  HCT 41.5  PLT 247    COAGS: Recent Labs    03/01/20 0741  INR 1.0    BMP: Recent Labs    03/01/20 0741  NA 142  K 4.4  CL 109  CO2 24  GLUCOSE 100*  BUN 19  CALCIUM 9.6  CREATININE 1.16  GFRNONAA >60  GFRAA >60    LIVER FUNCTION TESTS: Recent Labs    03/01/20 0741  BILITOT 0.4  AST 20  ALT 16  ALKPHOS 76  PROT 7.9  ALBUMIN 4.3    Assessment and Plan:  64 year old male with a history of HCV cirrhosis.  His HCV has been successfully treated, however surveillance imaging demonstrates 2 small (1 cm) lesions with imaging characteristics diagnostic for hepatocellular carcinoma (LI RADS 5). His lesions are visible by ultrasound and relatively peripheral in location, he would be an excellent candidate for percutaneous thermal ablation which would have a high likelihood of local regional control.  Patient presents today for CT-Guided percutaneous thermal ablation of two liver lesions. Risks and benefits of this procedure, including but not limited to internal bleeding, infection, injury to adjacent structures, anesthesia related complications  discussed with patient, patient verbalized understanding of risks and benefits.Patient aware of the plan for intubation and overnight observation following procedure. Consent obtained.   This procedure involves the use of X-rays and because of the nature of the planned procedure, it is possible that we will have prolonged use of X-ray fluoroscopy.  Potential radiation risks to you include (but are not limited to) the following: - A slightly elevated risk for cancer  several  years later in life. This risk is typically less than 0.5% percent. This risk is low in comparison to the normal incidence of human cancer, which is 33% for women and 50% for men according to the [place]. - Radiation induced injury can include skin redness, resembling a rash, tissue breakdown / ulcers and hair loss (which can be temporary or permanent).   The likelihood of either of these occurring depends on the difficulty of the procedure and whether you are sensitive to radiation due to previous procedures, disease, or genetic conditions.   IF your procedure requires a prolonged use of radiation, you will be notified and given written instructions for further action.  It is your responsibility to monitor the irradiated area for the 2 weeks following the procedure and to notify your physician if you are concerned that you have suffered a radiation induced injury.   Electronically Signed: D. Theressa Stamps NP 03/01/2020, 8:17 AM   I spent a total of 30 minutes at the the patient's bedside AND on the patient's hospital floor or unit, greater than 50% of which was counseling/coordinating care for CT guided thermal ablation of liver lesions.

## 2020-03-01 NOTE — Progress Notes (Signed)
Patient ID: Corey Armstrong, male   DOB: 10-28-1956, 64 y.o.   MRN: [id number]   Patient is resting comfortably in [place] room 1603. Family member is at the bedside. Patient is awake, A&O x4, has eaten and denies pain, nausea, vomiting. Patient is afebrile, VSS. Foley catheter has been removed; pt is breathing on room air.  Right abdominal percutaneous sites x2 are clean, dry and intact.  Will round on patient tomorrow for discharge home, patient will follow up with Dr. Laurence Ferrari in [place] clinic virtually in 3-4 weeks.  Soyla Dryer, NP

## 2020-03-01 NOTE — Anesthesia Preprocedure Evaluation (Addendum)
Anesthesia Evaluation  Patient identified by MRN, date of birth, ID band Patient awake    Reviewed: Allergy & Precautions, NPO status , Patient's Chart, lab work & pertinent test results  Airway Mallampati: I  TM Distance: >3 FB Neck ROM: Full    Dental  (+) Edentulous Upper, Edentulous Lower   Pulmonary neg pulmonary ROS, Current Smoker and Patient abstained from smoking.,    Pulmonary exam normal breath sounds clear to auscultation       Cardiovascular negative cardio ROS Normal cardiovascular exam Rhythm:Regular Rate:Normal     Neuro/Psych negative neurological ROS  negative psych ROS   GI/Hepatic negative GI ROS, (+) Hepatitis -, C  Endo/Other  negative endocrine ROS  Renal/GU negative Renal ROS  negative genitourinary   Musculoskeletal negative musculoskeletal ROS (+)   Abdominal   Peds  Hematology negative hematology ROS (+)   Anesthesia Other Findings HCC  Reproductive/Obstetrics                           Anesthesia Physical Anesthesia Plan  ASA: II  Anesthesia Plan: General   Post-op Pain Management:    Induction: Intravenous  PONV Risk Score and Plan: 1 and Midazolam, Dexamethasone and Ondansetron  Airway Management Planned: Oral ETT  Additional Equipment:   Intra-op Plan:   Post-operative Plan: Extubation in OR  Informed Consent: I have reviewed the patients History and Physical, chart, labs and discussed the procedure including the risks, benefits and alternatives for the proposed anesthesia with the patient or authorized representative who has indicated his/her understanding and acceptance.     Dental advisory given  Plan Discussed with: CRNA  Anesthesia Plan Comments:         Anesthesia Quick Evaluation

## 2020-03-01 NOTE — Transfer of Care (Signed)
Immediate Anesthesia Transfer of Care Note  Patient: Naithan Sanches  Procedure(s) Performed: CT WITH ANESTHESIA  THERMAL MICROWAVE  ABLATIION (N/A )  Patient Location: PACU  Anesthesia Type:General  Level of Consciousness: awake, alert  and patient cooperative  Airway & Oxygen Therapy: Patient Spontanous Breathing and Patient connected to face mask oxygen  Post-op Assessment: Report given to RN and Post -op Vital signs reviewed and stable  Post vital signs: Reviewed and stable  Last Vitals:  Vitals Value Taken Time  BP 188/91 03/01/20 1132  Temp    Pulse 57 03/01/20 1135  Resp 11 03/01/20 1135  SpO2 100 % 03/01/20 1135  Vitals shown include unvalidated device data.  Last Pain:  Vitals:   03/01/20 0724  TempSrc:   PainSc: 0-No pain         Complications: No apparent anesthesia complications

## 2020-03-02 ENCOUNTER — Encounter: Payer: Self-pay | Admitting: *Deleted

## 2020-03-02 DIAGNOSIS — C220 Liver cell carcinoma: Secondary | ICD-10-CM | POA: Diagnosis not present

## 2020-03-02 MED ORDER — OXYCODONE-ACETAMINOPHEN 5-325 MG PO TABS: 1.0000 | ORAL_TABLET | Freq: Four times a day (QID) | ORAL | 0 refills | Status: DC | PRN

## 2020-03-02 NOTE — Progress Notes (Signed)
Patient discharged to home with family, discharge instructions reviewed with patient who verbalized understanding. RX sent electronically to pharmacy.

## 2020-03-02 NOTE — Discharge Instructions (Signed)
Thermal Ablation of Liver Tumors, Care After This sheet gives you information about how to care for yourself after your procedure. Your health care provider may also give you more specific instructions. If you have problems or questions, contact your health care provider. What can I expect after the procedure? After your procedure, it is common to have pain and discomfort in the upper abdomen. You will be given pain medicines to control this. Follow these instructions at home: Incision care  Follow instructions from your health care provider about how to take care of your incision. Make sure you: ? Wash your hands with soap and water before you change your bandage (dressing). If soap and water are not available, use hand sanitizer. ? Ok to remove bandage after first shower (48 hours post-procedure), pat area dry immediately. No further dressing changes needed. Ensure area remains clean and dry.  Check your incision area every day for signs of infection. Check for: ? Redness, swelling, or pain. ? Fluid or blood. ? Warmth. ? Pus or a bad smell. Medicines  Take Percocet 5-325 once every 6 hours as needed for moderate to severe pain.  Do not drive or use heavy machinery while taking prescription pain medicine. Activity  Rest as often as needing during the first few days of recovery.  Do not lift anything that is heavier than 10 lbs (4.5 kg) for 2 weeks. General instructions  Do not take baths, swim, or use a hot tub for 7 days post-procedure.  Do not use any products that contain nicotine or tobacco, such as cigarettes and e-cigarettes. If you need help quitting, ask your health care provider.  Keep all follow-up visits as told by your health care provider. This is important. Our clinic will call you to set up this appointment- 4 weeks after discharge. Contact a health care provider if:  You cannot pass gas.  You are unable to have a bowel movement within 2 days.  You have a skin  rash. Get help right away if:  You have a fever.  You have severe or lasting pain in your abdomen, shoulder, or back.  You have trouble swallowing or breathing.  You have severe weakness or dizziness.  You have chest pain or shortness of breath. This information is not intended to replace advice given to you by your health care provider. Make sure you discuss any questions you have with your health care provider. Document Revised: 10/10/2017 Document Reviewed: 01/16/2017 Elsevier Patient Education  [place].

## 2020-03-02 NOTE — Discharge Summary (Signed)
Patient ID: Corey Armstrong MRN: [id number] DOB/AGE: 62-Aug-1957 62 y.o.  Admit date: 03/01/2020 Discharge date: 03/02/2020  Supervising Physician: Arne Cleveland  Patient Status: [facility] - In-pt  Admission Diagnoses: Hepatocellular carcinoma  Discharge Diagnoses:  Active Problems:   Hepatocellular carcinoma [facility])   Discharged Condition: stable  Hospital Course:  Patient presented to [facility] 03/01/2020 for a CT-guided thermal ablation of two liver lesions with Dr. Laurence Ferrari. Procedure occurred without major complications and patient was transferred to floor in stable condition (VSS, RUQ incisions stable) for overnight observation. No major events occurred overnight.  Patient awake and alert sitting in bed. Complains of mild RUQ pain, rated 3/10 at this time, requesting pain medications upon discharge. RUQ incisions stable. Plan to discharge home today and follow-up with Dr. Laurence Ferrari in clinic for televisit 4 weeks after discharge.   Consults: None  Significant Diagnostic Studies: DG Chest 1 View  Result Date: 03/01/2020 CLINICAL DATA:  Preop for ablation. EXAM: CHEST  1 VIEW COMPARISON:  None. FINDINGS: The heart size and mediastinal contours are within normal limits. Both lungs are clear. The visualized skeletal structures are unremarkable. IMPRESSION: No active disease. Electronically Signed   By: Marijo Conception M.D.   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 Unfortunately, surveillance MRI imaging demonstrates 2 LI-RADS category 5 hepatocellular carcinomas measuring 11 and 10 mm respectively. Patient presents today for planned curative percutaneous thermal ablation utilizing imaging guidance. EXAM: Percutaneous thermal ablation with CT and ultrasound guidance COMPARISON:  MRI abdomen 12/08/2019  MEDICATIONS: 3.375 g Zosyn; The antibiotic was administered in an appropriate time interval prior to needle puncture of the skin. ANESTHESIA/SEDATION: General - as administered by the Anesthesia department FLUOROSCOPY TIME:  None. COMPLICATIONS: None immediate. TECHNIQUE: Informed written consent was obtained from the patient after a thorough discussion of the procedural risks, benefits and alternatives. All questions were addressed. Maximal Sterile Barrier Technique was utilized including caps, mask, sterile gowns, sterile gloves, sterile drape, hand hygiene and skin antiseptic. A timeout was performed prior to the initiation of the procedure. CT imaging was performed with intravenous contrast to localize the lesions. No new lesions are identified. The liver was interrogated with ultrasound. The lesions in the periphery of hepatic segment 6 were well visualized. A NeuWave PR XT probe was successfully advanced into the more cephalad lesion. CT imaging confirmed needle placement and safety of critical structures. Percutaneous thermal ablation was then performed at 65 watts for 2 minutes 45 seconds under real-time ultrasound surveillance. The targeted lesion was completely engulfed cavitation on ultrasound suggesting a complete treatment. The probe was removed. A second entry site was identified and the probe was reinserted into the liver into the more caudal lesion. Percutaneous thermal ablation was then performed at 65 watts for 2 minutes 30 seconds under real-time ultrasound surveillance. Again, the targeted lesion was completely in golf de by cavitation on ultrasound suggesting a complete treatment. Post ablation CT imaging demonstrates well-positioned ablation zones corresponding with the sites of the previously visualized lesions. The lesions appear to have been completely treated. No evidence of immediate complication. FINDINGS: Technically successful percutaneous thermal ablation of 2 separate hepatocellular  carcinomas. IMPRESSION: Successful percutaneous thermal ablation. Signed, Criselda Peaches, MD, [facility] Radiology Electronically Signed   By: Jacqulynn Cadet M.D.   On: 03/01/2020 12:39  Treatments: CT-guided thermal ablation of two liver lesions  Discharge Exam: Blood pressure (!) 165/69, pulse (!) 55, temperature 97.8 F (36.6 C), temperature source Oral, resp. rate 13, height 5\' 8"  (1.727 m), weight 132 lb 3.2 oz (60 kg), SpO2 100 %. Physical Exam Vitals and nursing note reviewed.  Constitutional:      General: He is not in acute distress.    Appearance: Normal appearance.  Cardiovascular:     Rate and Rhythm: Normal rate and regular rhythm.     Heart sounds: Normal heart sounds. No murmur.  Pulmonary:     Effort: Pulmonary effort is normal. No respiratory distress.     Breath sounds: Normal breath sounds. No wheezing.  Abdominal:     Comments: RUQ incisions soft without tenderness, erythema, drainage, or active bleeding.  Skin:    General: Skin is warm and dry.  Neurological:     Mental Status: He is alert and oriented to person, place, and time.     Disposition: Discharge disposition: 01-Home or Self Care       Discharge Instructions    Call MD for:  difficulty breathing, headache or visual disturbances   Complete by: As directed    Call MD for:  extreme fatigue   Complete by: As directed    Call MD for:  hives   Complete by: As directed    Call MD for:  persistant dizziness or light-headedness   Complete by: As directed    Call MD for:  persistant nausea and vomiting   Complete by: As directed    Call MD for:  redness, tenderness, or signs of infection (pain, swelling, redness, odor or green/yellow discharge around incision site)   Complete by: As directed    Call MD for:  severe uncontrolled pain   Complete by: As directed    Call MD for:  temperature >100.4   Complete by: As directed    Diet -  low sodium heart healthy   Complete by: As directed    Discharge instructions   Complete by: As directed    Take Percocet 5-325 once every 6 hours as needed for moderate-severe pain. No stooping, bending, or lifting more than 10 pounds for 2 weeks. Ok to shower 48 hours post-procedure. Recommend showering with bandage on, remove bandage immediately after showering and pat dry. No further dressing changes needed after this. No submerging (swimming, bathing) for 7 days post-procedure.   Increase activity slowly   Complete by: As directed      Allergies as of 03/02/2020   No Known Allergies     Medication List    TAKE these medications   cetirizine 10 MG tablet Commonly known as: ZYRTEC Take 10 mg by mouth daily as needed for allergies.   loratadine 10 MG tablet Commonly known as: CLARITIN Take 10 mg by mouth daily as needed for allergies.   oxyCODONE-acetaminophen 5-325 MG tablet Commonly known as: Percocet Take 1 tablet by mouth every 6 (six) hours as needed for moderate pain or severe pain.      Follow-up Information    Jacqulynn Cadet, MD Follow up in 4 week(s).   Specialties: Interventional Radiology, Radiology Why: Please follow-up with Dr. Laurence Ferrari in clinic 4 weeks after discharge. Our office will call you to set up this appointment. Contact information: [place] [address] [phone]            Electronically Signed: Earley Abide, PA-C 03/02/2020, 10:41 AM   I  have spent Greater Than 30 Minutes discharging Corey Armstrong.

## 2020-03-02 NOTE — Anesthesia Postprocedure Evaluation (Signed)
Anesthesia Post Note  Patient: Corey Armstrong  Procedure(s) Performed: CT WITH ANESTHESIA  THERMAL MICROWAVE  ABLATIION (N/A )     Patient location during evaluation: PACU Anesthesia Type: General Level of consciousness: awake and alert Pain management: pain level controlled Vital Signs Assessment: post-procedure vital signs reviewed and stable Respiratory status: spontaneous breathing, nonlabored ventilation, respiratory function stable and patient connected to nasal cannula oxygen Cardiovascular status: blood pressure returned to baseline and stable Postop Assessment: no apparent nausea or vomiting Anesthetic complications: no    Last Vitals:  Vitals:   03/01/20 1230 03/01/20 1455  BP: (!) 165/69   Pulse: (!) 55   Resp: 13   Temp: (!) 36.4 C 36.6 C  SpO2: 100%     Last Pain:  Vitals:   03/02/20 0730  TempSrc:   PainSc: 0-No pain                 Mazella Deen L Louvina Cleary

## 2020-03-04 ENCOUNTER — Ambulatory Visit: Payer: Commercial Managed Care - PPO | Attending: Internal Medicine

## 2020-03-04 DIAGNOSIS — Z23 Encounter for immunization: Secondary | ICD-10-CM

## 2020-03-04 NOTE — Progress Notes (Signed)
   [id number] Vaccination [facility]  Name:  Alve Skenandore Sr.    MRN: [id number] DOB: Apr 06, 1956  03/04/2020  Mr. Minor was observed post Covid-19 immunization for 15 minutes without incident. He was provided with Vaccine Information Sheet and instruction to access the V-Safe system.   Mr. Frere was instructed to call 911 with any severe reactions post vaccine: Marland Kitchen Difficulty breathing  . Swelling of face and throat  . A fast heartbeat  . A bad rash all over body  . Dizziness and weakness   Immunizations Administered    Name Date Dose VIS Date Route   Pfizer COVID-19 Vaccine 03/04/2020  4:12 PM 0.3 mL 01/05/2019 Intramuscular   Manufacturer: [employer], [employer]   Lot: [id number]   [place]: [id number]

## 2020-03-06 ENCOUNTER — Encounter: Payer: Self-pay | Admitting: *Deleted

## 2020-03-21 ENCOUNTER — Encounter: Payer: Self-pay | Admitting: *Deleted

## 2020-03-21 ENCOUNTER — Other Ambulatory Visit: Payer: Self-pay

## 2020-03-21 ENCOUNTER — Ambulatory Visit
Admission: RE | Admit: 2020-03-21 | Discharge: 2020-03-21 | Disposition: A | Discharge status: Home / Self Care | Payer: Commercial Managed Care - PPO | Source: Ambulatory Visit | Attending: Student | Admitting: Student

## 2020-03-21 DIAGNOSIS — C220 Liver cell carcinoma: Secondary | ICD-10-CM

## 2020-03-21 HISTORY — PX: IR RADIOLOGIST EVAL & MGMT: IMG5224

## 2020-03-21 NOTE — Progress Notes (Signed)
Chief Complaint: Patient was seen in follow-up remotely today (TeleHealth) for hepatocellular carcinoma at the request of Louk,Alexandra M.    Referring Physician(s): Roosevelt Locks, NP  History of Present Illness: Corey General Sr. is a 64 y.o. male with a history of hepatitis C now status post 12-week treatment with Epclusa with a sustained virologic response and cure.  Prior to his treatment, he was confirmed to have F4 fibrosis.  Surveillance imaging demonstrated a small indeterminate solid lesion measuring 8 mm in October 2019.  Follow-up ultrasound in July 2020 demonstrated slight interval enlargement to 9 mm.  Dedicated MRI with Eovist contrast performed 12/08/2019 demonstrated 2 small lesions in the periphery of the right hepatic lobe which demonstrated imaging characteristics consistent with LI-RADS Category 5 definitive hepatocellular carcinoma.  The lesions measure 11 and 10 mm respectively.  He underwent percutaneous thermal ablation with microwave on 03/01/2020.  We are now consulting remotely today to evaluate his recovery.  Corey Armstrong is doing well.  He has no significant pain.  His appetite is good.  He has had no complications following his ablation procedure.  I let him know that his procedure went very well and we had excellent coverage of the lesion sites.  Past Medical History:  Diagnosis Date  . Cancer ([place])   . Hepatitis    HX OF hEP c ?   . Murmur, heart 1963    Past Surgical History:  Procedure Laterality Date  . IR RADIOLOGIST EVAL & MGMT  01/25/2020  . IR RADIOLOGIST EVAL & MGMT  03/21/2020  . RADIOLOGY WITH ANESTHESIA N/A 03/01/2020   Procedure: CT WITH ANESTHESIA  THERMAL MICROWAVE  ABLATIION;  Surgeon: Jacqulynn Cadet, MD;  Location: [facility]S;  Service: Anesthesiology;  Laterality: N/A;    Allergies: Patient has no known allergies.  Medications: Prior to Admission medications   Medication Sig Start Date End Date Taking? Authorizing Provider    cetirizine (ZYRTEC) 10 MG tablet Take 10 mg by mouth daily as needed for allergies.    [provider]  loratadine (CLARITIN) 10 MG tablet Take 10 mg by mouth daily as needed for allergies.    [provider]  oxyCODONE-acetaminophen (PERCOCET) 5-325 MG tablet Take 1 tablet by mouth every 6 (six) hours as needed for moderate pain or severe pain. 03/02/20   Louk, Bea Graff, PA-C     Family History  Problem Relation Age of Onset  . Arthritis Mother   . Cancer Neg Hx   . Diabetes Neg Hx   . Drug abuse Neg Hx   . Heart disease Neg Hx   . Hyperlipidemia Neg Hx   . Hypertension Neg Hx   . Kidney disease Neg Hx   . Stroke Neg Hx     Social History   Socioeconomic History  . Marital status: Divorced    Spouse name: Not on file  . Number of children: 2  . Years of education: 75  . Highest education level: Not on file  Occupational History  . Not on file  Tobacco Use  . Smoking status: Current Some Day Smoker    Types: Cigars  . Smokeless tobacco: Never Used  . Tobacco comment: 3 per week  Substance and Sexual Activity  . Alcohol use: Not Currently  . Drug use: No  . Sexual activity: Not Currently  Other Topics Concern  . Not on file  Social History Narrative  . Not on file   Social Determinants of Health   Financial  Resource Strain:   . Difficulty of Paying Living Expenses:   Food Insecurity:   . Worried About Charity fundraiser in the Last Year:   . Arboriculturist in the Last Year:   Transportation Needs:   . Film/video editor (Medical):   Marland Kitchen Lack of Transportation (Non-Medical):   Physical Activity:   . Days of Exercise per Week:   . Minutes of Exercise per Session:   Stress:   . Feeling of Stress :   Social Connections:   . Frequency of Communication with Friends and Family:   . Frequency of Social Gatherings with Friends and Family:   . Attends Religious Services:   . Active Member of Clubs or Organizations:   . Attends Theatre manager Meetings:   Marland Kitchen Marital Status:     ECOG Status: 0 - Asymptomatic  Review of Systems  Review of Systems: A 12 point ROS discussed and pertinent positives are indicated in the HPI above.  All other systems are negative.  Physical Exam No direct physical exam was performed (except for noted visual exam findings with Video Visits).   Vital Signs: There were no vitals taken for this visit.  Imaging: DG Chest 1 View  Result Date: 03/01/2020 CLINICAL DATA:  Preop for ablation. EXAM: CHEST  1 VIEW COMPARISON:  None. FINDINGS: The heart size and mediastinal contours are within normal limits. Both lungs are clear. The visualized skeletal structures are unremarkable. IMPRESSION: No active disease. Electronically Signed   By: Marijo Conception M.D.   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 Unfortunately, surveillance MRI imaging demonstrates 2 LI-RADS category 5 hepatocellular carcinomas measuring 11 and 10 mm respectively. Patient presents today for planned curative percutaneous thermal ablation utilizing imaging guidance. EXAM: Percutaneous thermal ablation with CT and ultrasound guidance COMPARISON:  MRI abdomen 12/08/2019 MEDICATIONS: 3.375 g Zosyn; The antibiotic was administered in an appropriate time interval prior to needle puncture of the skin. ANESTHESIA/SEDATION: General - as administered by the Anesthesia department FLUOROSCOPY TIME:  None. COMPLICATIONS: None immediate. TECHNIQUE: Informed written consent was obtained from the patient after a thorough discussion of the procedural risks, benefits and alternatives. All questions were addressed. Maximal Sterile Barrier Technique was utilized including caps, mask, sterile gowns, sterile gloves, sterile drape, hand hygiene and skin antiseptic. A timeout was  performed prior to the initiation of the procedure. CT imaging was performed with intravenous contrast to localize the lesions. No new lesions are identified. The liver was interrogated with ultrasound. The lesions in the periphery of hepatic segment 6 were well visualized. A NeuWave PR XT probe was successfully advanced into the more cephalad lesion. CT imaging confirmed needle placement and safety of critical structures. Percutaneous thermal ablation was then performed at 65 watts for 2 minutes 45 seconds under real-time ultrasound surveillance. The targeted lesion was completely engulfed cavitation on ultrasound suggesting a complete treatment. The probe was removed. A second entry site was identified and the probe was reinserted into the liver into the more caudal lesion. Percutaneous thermal ablation was then performed at 65 watts for 2 minutes 30 seconds under real-time ultrasound surveillance. Again, the targeted lesion was completely in golf de by cavitation on ultrasound suggesting a complete treatment. Post ablation CT imaging demonstrates well-positioned ablation zones corresponding with the sites of the previously visualized  lesions. The lesions appear to have been completely treated. No evidence of immediate complication. FINDINGS: Technically successful percutaneous thermal ablation of 2 separate hepatocellular carcinomas. IMPRESSION: Successful percutaneous thermal ablation. Signed, Criselda Peaches, MD, [facility] Radiology Electronically Signed   By: Jacqulynn Cadet M.D.   On: 03/01/2020 12:39   IR Radiologist Eval & Mgmt  Result Date: 03/21/2020 Please refer to notes tab for details about interventional procedure. (Op Note)   Labs:  CBC: Recent Labs    03/01/20 0741  WBC 6.1  HGB 13.3  HCT 41.5  PLT 247    COAGS: Recent Labs    03/01/20 0741  INR 1.0    BMP: Recent Labs    03/01/20 0741  NA 142  K 4.4  CL  109  CO2 24  GLUCOSE 100*  BUN 19  CALCIUM 9.6  CREATININE 1.16  GFRNONAA >60  GFRAA >60    LIVER FUNCTION TESTS: Recent Labs    03/01/20 0741  BILITOT 0.4  AST 20  ALT 16  ALKPHOS 76  PROT 7.9  ALBUMIN 4.3    TUMOR MARKERS: No results for input(s): AFPTM, CEA, CA199, CHROMGRNA in the last 8760 hours.  Assessment and Plan:  Doing well 2 weeks status post percutaneous thermal ablation of hepatocellular carcinoma.  Recovery has been uneventful, no evidence of complication.  1.)  Repeat MRI abdomen with gadolinium contrast at 3 months post ablation (late July 2021) with accompanying liver labs including AFP and follow-up clinic visit.    Electronically Signed: Jacqulynn Cadet 03/21/2020, 12:15 PM   I spent a total of  15 Minutes in remote  clinical consultation, greater than 50% of which was counseling/coordinating care for hepatocellular carcinoma.    Visit type: Audio only (telephone). Audio (no video) only due to patient request. . Alternative for in-person consultation at [place], [place] [address], [place], [place]. This visit type was conducted due to national recommendations for restrictions regarding the COVID-19 Pandemic (e.g. social distancing).  This format is felt to be most appropriate for this patient at this time.  All issues noted in this document were discussed and addressed.

## 2020-05-04 ENCOUNTER — Other Ambulatory Visit: Payer: Self-pay | Admitting: Interventional Radiology

## 2020-05-04 ENCOUNTER — Other Ambulatory Visit: Payer: Self-pay

## 2020-05-04 DIAGNOSIS — C220 Liver cell carcinoma: Secondary | ICD-10-CM

## 2020-05-04 DIAGNOSIS — D134 Benign neoplasm of liver: Secondary | ICD-10-CM

## 2020-05-17 LAB — COMPLETE METABOLIC PANEL WITH GFR
AG Ratio: 1.3 (calc) (ref 1.0–2.5)
ALT: 9 U/L (ref 9–46)
AST: 16 U/L (ref 10–35)
Albumin: 4.3 g/dL (ref 3.6–5.1)
Alkaline phosphatase (APISO): 97 U/L (ref 35–144)
BUN: 13 mg/dL (ref 7–25)
CO2: 27 mmol/L (ref 20–32)
Calcium: 9.6 mg/dL (ref 8.6–10.3)
Chloride: 107 mmol/L (ref 98–110)
Creat: 1.12 mg/dL (ref 0.70–1.25)
GFR, Est African American: 81 mL/min/{1.73_m2} (ref >=60)
GFR, Est Non African American: 70 mL/min/{1.73_m2} (ref >=60)
Globulin: 3.3 g/dL (calc) (ref 1.9–3.7)
Glucose, Bld: 89 mg/dL (ref 65–99)
Potassium: 4.8 mmol/L (ref 3.5–5.3)
Sodium: 141 mmol/L (ref 135–146)
Total Bilirubin: 0.4 mg/dL (ref 0.2–1.2)
Total Protein: 7.6 g/dL (ref 6.1–8.1)

## 2020-05-17 LAB — AFP TUMOR MARKER: AFP-Tumor Marker: 2.1 ng/mL (ref <6.1)

## 2020-05-17 LAB — CBC
HCT: 39.1 % (ref 38.5–50.0)
Hemoglobin: 13.2 g/dL (ref 13.2–17.1)
MCH: 32 pg (ref 27.0–33.0)
MCHC: 33.8 g/dL (ref 32.0–36.0)
MCV: 94.9 fL (ref 80.0–100.0)
MPV: 10.5 fL (ref 7.5–12.5)
Platelets: 211 10*3/uL (ref 140–400)
RBC: 4.12 10*6/uL — ABNORMAL LOW (ref 4.20–5.80)
RDW: 11.9 % (ref 11.0–15.0)
WBC: 5.6 10*3/uL (ref 3.8–10.8)

## 2020-05-17 LAB — PROTIME-INR
INR: 1
Prothrombin Time: 11 s (ref 9.0–11.5)

## 2020-06-02 ENCOUNTER — Ambulatory Visit (HOSPITAL_COMMUNITY)
Admission: RE | Admit: 2020-06-02 | Discharge: 2020-06-02 | Disposition: A | Discharge status: Home / Self Care | Payer: Commercial Managed Care - PPO | Source: Ambulatory Visit | Attending: Interventional Radiology | Admitting: Interventional Radiology

## 2020-06-02 ENCOUNTER — Other Ambulatory Visit: Payer: Self-pay

## 2020-06-02 DIAGNOSIS — C220 Liver cell carcinoma: Secondary | ICD-10-CM | POA: Diagnosis present

## 2020-06-02 IMAGING — MR MR ABDOMEN WO/W CM
18 series · 48 of 48 positions shown · IV contrast (gadavist)
Comparison: CT guided ablation, [DATE], MR abdomen, [DATE]

CLINICAL DATA: Cirrhosis, HCC, status post ablation

EXAM:
MRI ABDOMEN WITHOUT AND WITH CONTRAST
TECHNIQUE: Multiplanar multisequence MR imaging of the abdomen was performed
both before and after the administration of intravenous contrast.
CONTRAST:  10mL GADAVIST GADOBUTROL 1 MMOL/ML IV SOLN

[Series 3: T2 · coronal · 6.0mm · 1.27mm/px · 2 of 27 slices shown (1 of 2)]
[im 1/27]
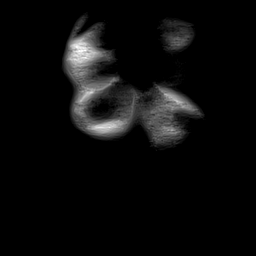
[im 27/27]
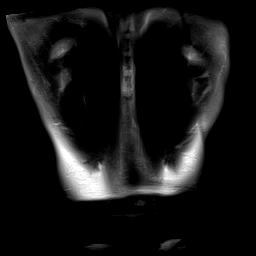

[Series 5: T2 fat-sat · axial · 6.0mm · 1.02mm/px · z∈[-87,+165]mm · 2 of 36 slices shown]
[im 1/36]
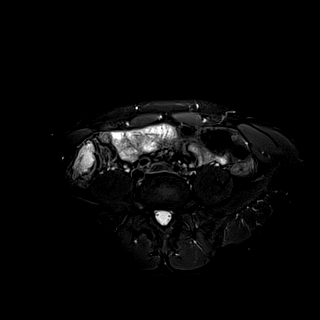
[im 36/36]
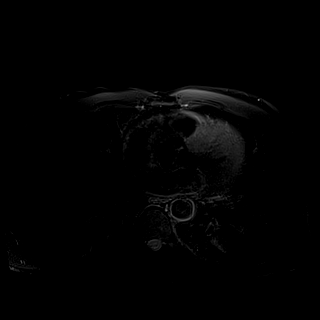

[Series 6: T1 · axial · 3.0mm · 1.02mm/px · z∈[-67,+170]mm · 4 of 80 slices shown (1 of 2)]
[im 1/80]
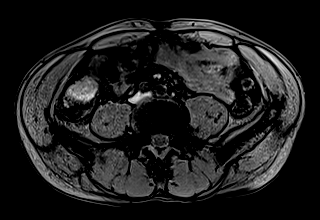
[im 27/80]
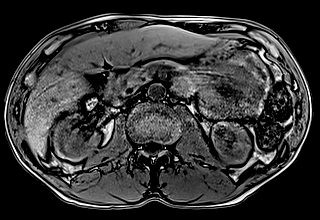
[im 53/80]
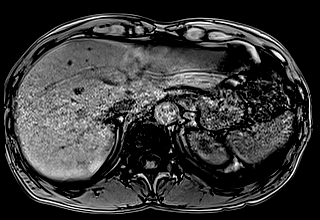
[im 80/80]
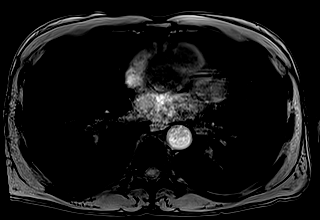

[Series 7: T1 · axial · 3.0mm · 1.02mm/px · z∈[-67,+170]mm · 3 of 80 slices shown (2 of 2)]
[im 1/80]
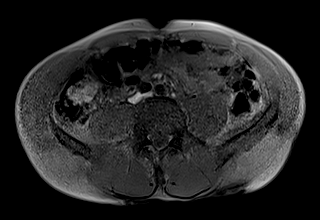
[im 40/80]
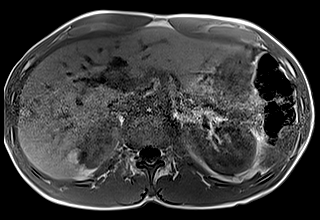
[im 80/80]
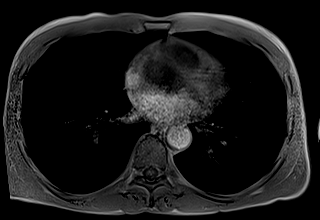

[Series 8: DWI · axial · 6.0mm · 1.36mm/px · z∈[-82,+170]mm · 3 of 72 slices shown (1 of 2)]
[im 1/72]
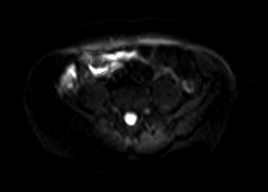
[im 36/72]
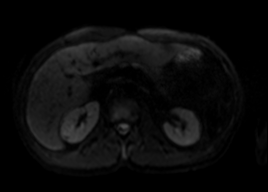
[im 72/72]
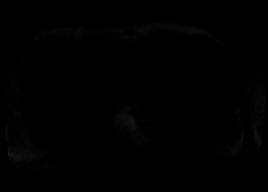

[Series 9: DWI · axial · 6.0mm · 1.36mm/px · 1 of 36 slices shown (2 of 2)]
[im 1/36]
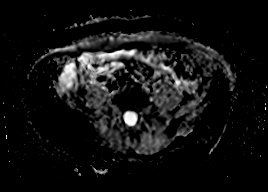

[Series 10: bSSFP · axial · 4.0mm · 0.63mm/px · z∈[-89,+167]mm · 2 of 65 slices shown]
[im 1/65]
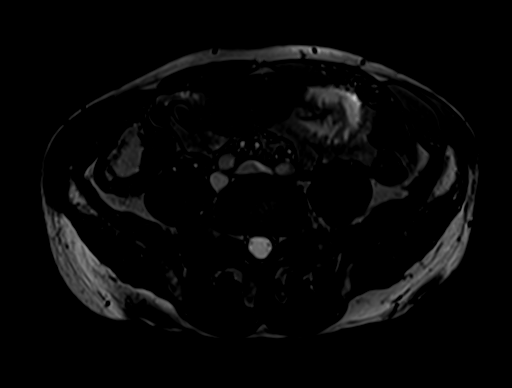
[im 65/65]
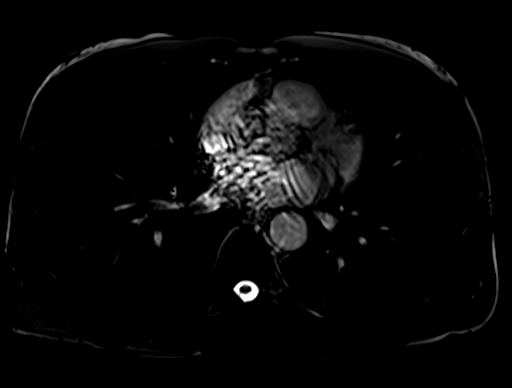

[Series 12: T1 dynamic · axial · 3.0mm · 1.02mm/px · z∈[-86,+175]mm · 3 of 88 slices shown (1 of 10)]
[im 1/88]
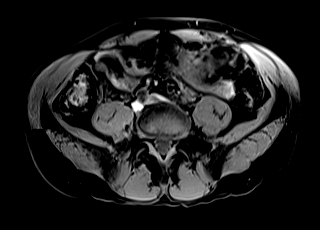
[im 44/88]
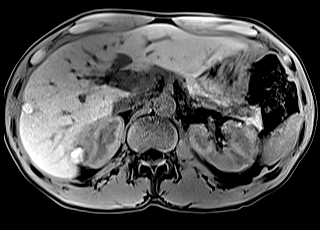
[im 88/88]
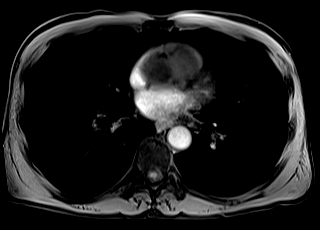

[Series 16: T1 dynamic · axial · 3.0mm · 1.02mm/px · z∈[-86,+175]mm · 3 of 88 slices shown (2 of 10)]
[im 1/88]
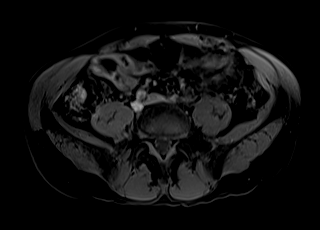
[im 44/88]
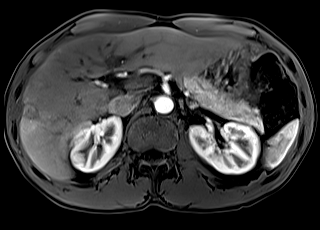
[im 88/88]
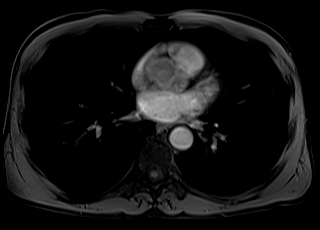

[Series 17: T1 dynamic · axial · 3.0mm · 1.02mm/px · z∈[-86,+175]mm · 3 of 88 slices shown (3 of 10)]
[im 1/88]
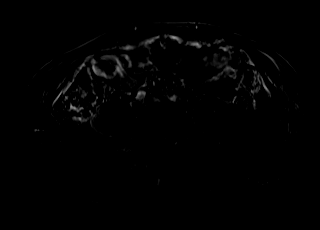
[im 44/88]
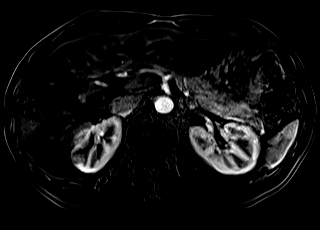
[im 88/88]
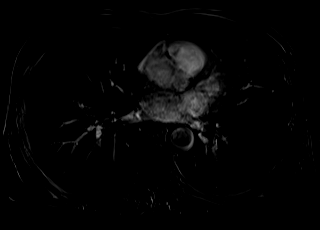

[Series 20: T1 dynamic · axial · 3.0mm · 1.02mm/px · z∈[-86,+175]mm · 3 of 88 slices shown (4 of 10)]
[im 1/88]
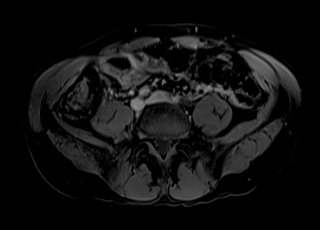
[im 44/88]
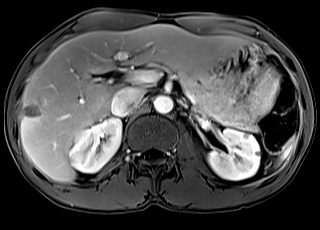
[im 88/88]
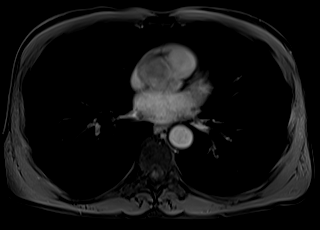

[Series 21: T1 dynamic · axial · 3.0mm · 1.02mm/px · z∈[-86,+175]mm · 3 of 88 slices shown (5 of 10)]
[im 1/88]
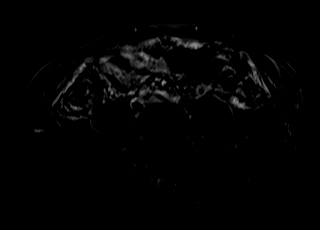
[im 44/88]
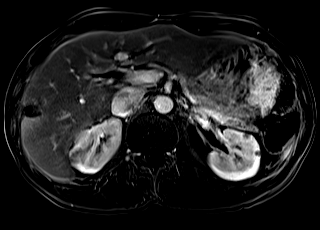
[im 88/88]
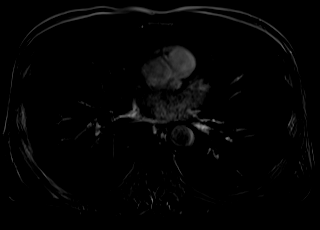

[Series 24: T1 dynamic · axial · 3.0mm · 1.02mm/px · z∈[-86,+175]mm · 3 of 88 slices shown (6 of 10)]
[im 1/88]
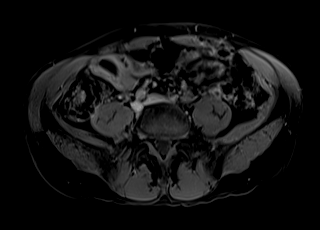
[im 44/88]
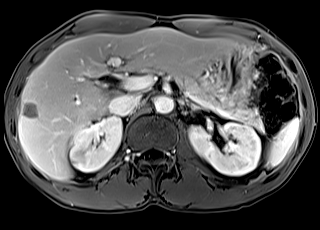
[im 88/88]
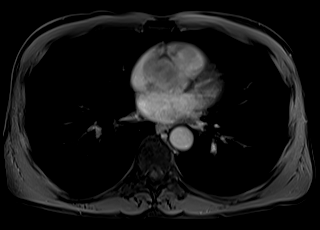

[Series 25: T1 dynamic · axial · 3.0mm · 1.02mm/px · z∈[-86,+175]mm · 3 of 88 slices shown (7 of 10)]
[im 1/88]
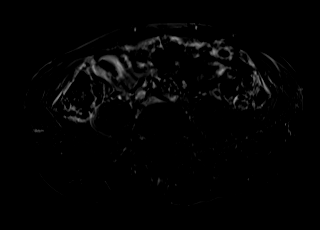
[im 44/88]
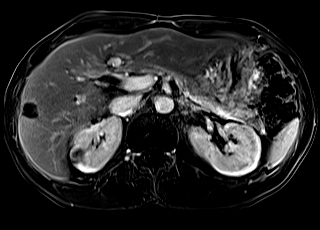
[im 88/88]
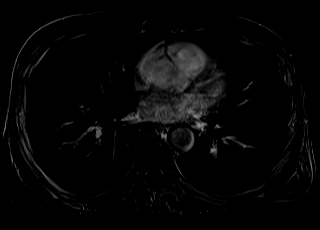

[Series 27: T1 dynamic · coronal · 3.0mm · 1.02mm/px · 3 of 72 slices shown (8 of 10)]
[im 1/72]
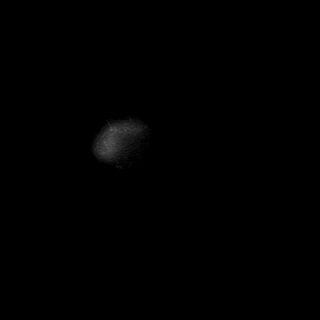
[im 36/72]
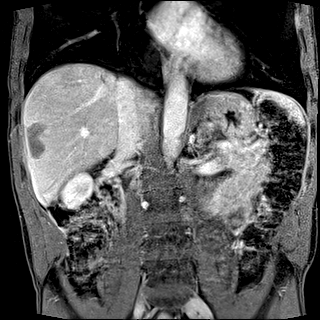
[im 72/72]
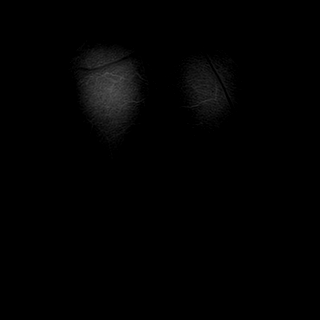

[Series 28: T2 · axial · 6.0mm · 1.27mm/px · 1 of 36 slices shown (2 of 2)]
[im 1/36]
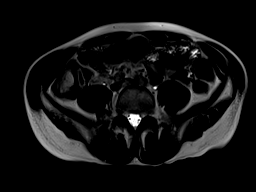

[Series 31: T1 dynamic · axial · 3.0mm · 1.02mm/px · z∈[-86,+175]mm · 3 of 88 slices shown (9 of 10)]
[im 1/88]
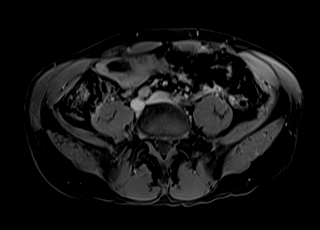
[im 44/88]
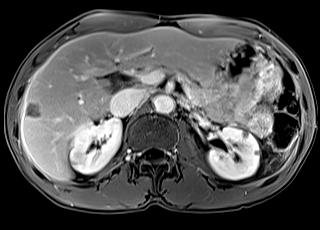
[im 88/88]
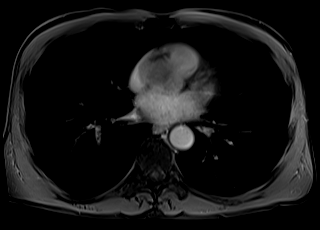

[Series 32: T1 dynamic · axial · 3.0mm · 1.02mm/px · z∈[-86,+175]mm · 3 of 88 slices shown (10 of 10)]
[im 1/88]
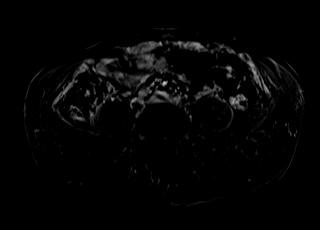
[im 44/88]
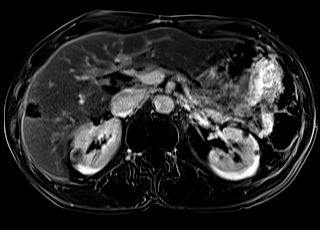
[im 88/88]
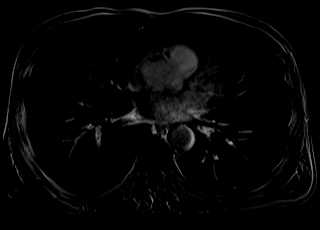

[48 of 48 positions shown; findings below may reference images not displayed]

FINDINGS: Lower chest: No acute findings.

Hepatobiliary: There has been interval ablation at two adjacent
sites of the subcapsular right lobe of the liver, hepatic segment
VI. There is no residual contrast enhancement at these ablation
sites. There is an incidental, adjacent simple, nonenhancing cyst.
There are scattered, subcentimeter foci of arterial hyperenhancement
of the anterior right lobe of the liver, for example adjacent to the
ablation site measuring 3 mm (series 16, image 41) and in the medial
anterior right lobe measuring 6 mm (series 16, image 28). This
larger focus is unchanged compared to prior. Others were not
previously seen.

Pancreas: No mass, inflammatory changes, or other parenchymal
abnormality identified.

Spleen:  Within normal limits in size and appearance.

Adrenals/Urinary Tract: No masses identified. Nonenhancing renal
cysts, including a hemorrhagic or proteinaceous cyst of the superior
pole of the right kidney. No evidence of hydronephrosis.

Stomach/Bowel: Visualized portions within the abdomen are
unremarkable.

Vascular/Lymphatic: No pathologically enlarged lymph nodes
identified. No abdominal aortic aneurysm demonstrated.

Other:  None.

Musculoskeletal: No suspicious bone lesions identified.
IMPRESSION: 1. There has been interval ablation at two adjacent sites of the
subcapsular right lobe of the liver, hepatic segment VI. There is no
residual contrast enhancement at these adjacent sites. SURDEJ
category 5 TR, nonviable.
2. There are scattered, subcentimeter foci of arterial
hyperenhancement of the anterior right lobe of the liver, for
example adjacent to the ablation site measuring 3 mm and in the
medial anterior right lobe of the liver measuring 6 mm. This larger
focus is unchanged compared to prior. Others were not previously
seen. These findings are nonspecific and consistent with SURDEJ
category 3. Attention on follow-up.

## 2020-06-02 MED ORDER — GADOBUTROL 1 MMOL/ML IV SOLN
10.0000 mL | Freq: Once | INTRAVENOUS | Status: AC | PRN
  Administered 2020-06-02: 10 mL via INTRAVENOUS

## 2020-06-07 ENCOUNTER — Encounter: Payer: Self-pay | Admitting: *Deleted

## 2020-06-07 ENCOUNTER — Ambulatory Visit
Admission: RE | Admit: 2020-06-07 | Discharge: 2020-06-07 | Disposition: A | Discharge status: Home / Self Care | Payer: Commercial Managed Care - PPO | Source: Ambulatory Visit | Attending: Interventional Radiology | Admitting: Interventional Radiology

## 2020-06-07 ENCOUNTER — Other Ambulatory Visit: Payer: Self-pay

## 2020-06-07 DIAGNOSIS — C220 Liver cell carcinoma: Secondary | ICD-10-CM

## 2020-06-07 HISTORY — PX: IR RADIOLOGIST EVAL & MGMT: IMG5224

## 2020-06-07 NOTE — Progress Notes (Signed)
Chief Complaint: Patient was seen in follow-up remotely today (TeleHealth) for  at the request of [place].    Referring Physician(s): Roosevelt Locks, NP  History of Present Illness: Corey Schlarb Sr. is a 64 y.o. male with a history of hepatitis C now status post 12-week treatment with Epclusa with a sustained virologic response and cure.  Prior to his treatment, he was confirmed to have F4 fibrosis.  Surveillance imaging demonstrated a small indeterminate solid lesion measuring 8 mm in October 2019.  Follow-up ultrasound in July 2020 demonstrated slight interval enlargement to 9 mm.  Dedicated MRI with Eovist contrast performed 12/08/2019 demonstrated 2 small lesions in the periphery of the right hepatic lobe which demonstrated imaging characteristics consistent with BI-RADS Category 5 definitive hepatocellular carcinoma.  The lesions measure 11 and 10 mm respectively.  He underwent successful percutaneous microwave ablation of the 2 lesions on 03/01/2020.  His postprocedural course was not complicated and he has recovered fully.  He is currently asymptomatic and doing well.  He denies abdominal pain, nausea, vomiting, decreased appetite, fever or chills.  Initial surveillance MRI dated 06/02/2020 demonstrates successful ablation of the 2 adjacent sites in hepatic segment 6 with no residual contrast enhancement.  A few scattered subcentimeter foci of arterial hyperenhancement are also noted along the anterior right hemiliver measuring no more than 6 mm consistent with BI-RADS Category 3, nonspecific lesions.  These warrant attention on follow-up imaging.  Past Medical History:  Diagnosis Date  . Cancer ([place])   . Hepatitis    HX OF hEP c ?   . Murmur, heart 1963    Past Surgical History:  Procedure Laterality Date  . IR RADIOLOGIST EVAL & MGMT  01/25/2020  . IR RADIOLOGIST EVAL & MGMT  03/21/2020  . RADIOLOGY WITH ANESTHESIA N/A 03/01/2020   Procedure: CT WITH ANESTHESIA   THERMAL MICROWAVE  ABLATIION;  Surgeon: Jacqulynn Cadet, MD;  Location: [facility];  Service: Anesthesiology;  Laterality: N/A;    Allergies: Patient has no known allergies.  Medications: Prior to Admission medications   Medication Sig Start Date End Date Taking? Authorizing Provider  cetirizine (ZYRTEC) 10 MG tablet Take 10 mg by mouth daily as needed for allergies.    [provider]  loratadine (CLARITIN) 10 MG tablet Take 10 mg by mouth daily as needed for allergies.    [provider]  oxyCODONE-acetaminophen (PERCOCET) 5-325 MG tablet Take 1 tablet by mouth every 6 (six) hours as needed for moderate pain or severe pain. 03/02/20   Louk, Bea Graff, PA-C     Family History  Problem Relation Age of Onset  . Arthritis Mother   . Cancer Neg Hx   . Diabetes Neg Hx   . Drug abuse Neg Hx   . Heart disease Neg Hx   . Hyperlipidemia Neg Hx   . Hypertension Neg Hx   . Kidney disease Neg Hx   . Stroke Neg Hx     Social History   Socioeconomic History  . Marital status: Divorced    Spouse name: Not on file  . Number of children: 2  . Years of education: 105  . Highest education level: Not on file  Occupational History  . Not on file  Tobacco Use  . Smoking status: Current Some Day Smoker    Types: Cigars  . Smokeless tobacco: Never Used  . Tobacco comment: 3 per week  Vaping Use  . Vaping Use: Never used  Substance and Sexual Activity  .  Alcohol use: Not Currently  . Drug use: No  . Sexual activity: Not Currently  Other Topics Concern  . Not on file  Social History Narrative  . Not on file   Social Determinants of Health   Financial Resource Strain:   . Difficulty of Paying Living Expenses:   Food Insecurity:   . Worried About Charity fundraiser in the Last Year:   . Arboriculturist in the Last Year:   Transportation Needs:   . Film/video editor (Medical):   Marland Kitchen Lack of Transportation (Non-Medical):   Physical Activity:   . Days of Exercise  per Week:   . Minutes of Exercise per Session:   Stress:   . Feeling of Stress :   Social Connections:   . Frequency of Communication with Friends and Family:   . Frequency of Social Gatherings with Friends and Family:   . Attends Religious Services:   . Active Member of Clubs or Organizations:   . Attends Archivist Meetings:   Marland Kitchen Marital Status:     ECOG Status: 0 - Asymptomatic  Review of Systems  Review of Systems: A 12 point ROS discussed and pertinent positives are indicated in the HPI above.  All other systems are negative.  Physical Exam No direct physical exam was performed (except for noted visual exam findings with Video Visits).    Vital Signs: There were no vitals taken for this visit.  Imaging: MR ABDOMEN WWO CONTRAST  Result Date: 06/03/2020 CLINICAL DATA:  Cirrhosis, HCC, status post ablation EXAM: MRI ABDOMEN WITHOUT AND WITH CONTRAST TECHNIQUE: Multiplanar multisequence MR imaging of the abdomen was performed both before and after the administration of intravenous contrast. CONTRAST:  9mL GADAVIST GADOBUTROL 1 MMOL/ML IV SOLN COMPARISON:  CT guided ablation, 03/01/2020, MR abdomen, 12/08/2019 FINDINGS: Lower chest: No acute findings. Hepatobiliary: There has been interval ablation at two adjacent sites of the subcapsular right lobe of the liver, hepatic segment VI. There is no residual contrast enhancement at these ablation sites. There is an incidental, adjacent simple, nonenhancing cyst. There are scattered, subcentimeter foci of arterial hyperenhancement of the anterior right lobe of the liver, for example adjacent to the ablation site measuring 3 mm (series 16, image 41) and in the medial anterior right lobe measuring 6 mm (series 16, image 28). This larger focus is unchanged compared to prior. Others were not previously seen. Pancreas: No mass, inflammatory changes, or other parenchymal abnormality identified. Spleen:  Within normal limits in size and  appearance. Adrenals/Urinary Tract: No masses identified. Nonenhancing renal cysts, including a hemorrhagic or proteinaceous cyst of the superior pole of the right kidney. No evidence of hydronephrosis. Stomach/Bowel: Visualized portions within the abdomen are unremarkable. Vascular/Lymphatic: No pathologically enlarged lymph nodes identified. No abdominal aortic aneurysm demonstrated. Other:  None. Musculoskeletal: No suspicious bone lesions identified. IMPRESSION: 1. There has been interval ablation at two adjacent sites of the subcapsular right lobe of the liver, hepatic segment VI. There is no residual contrast enhancement at these adjacent sites. LI-RADS category 5 TR, nonviable. 2. There are scattered, subcentimeter foci of arterial hyperenhancement of the anterior right lobe of the liver, for example adjacent to the ablation site measuring 3 mm and in the medial anterior right lobe of the liver measuring 6 mm. This larger focus is unchanged compared to prior. Others were not previously seen. These findings are nonspecific and consistent with LI-RADS category 3. Attention on follow-up. Electronically Signed   By: Eddie Candle  M.D.   On: 06/03/2020 14:56    Labs:  CBC: Recent Labs    03/01/20 0741 05/16/20 1008  WBC 6.1 5.6  HGB 13.3 13.2  HCT 41.5 39.1  PLT 247 211    COAGS: Recent Labs    03/01/20 0741 05/16/20 1008  INR 1.0 1.0    BMP: Recent Labs    03/01/20 0741 05/16/20 1008  NA 142 141  K 4.4 4.8  CL 109 107  CO2 24 27  GLUCOSE 100* 89  BUN 19 13  CALCIUM 9.6 9.6  CREATININE 1.16 1.12  GFRNONAA >60 70  GFRAA >60 81    LIVER FUNCTION TESTS: Recent Labs    03/01/20 0741 05/16/20 1008  BILITOT 0.4 0.4  AST 20 16  ALT 16 9  ALKPHOS 76  --   PROT 7.9 7.6  ALBUMIN 4.3  --     TUMOR MARKERS: Recent Labs    05/16/20 1008  AFPTM 2.1    Assessment and Plan:  Doing very well 3 months status post percutaneous thermal ablation of 2 separate small  hepatocellular carcinomas in hepatic segment 6.  His postoperative course was uncomplicated.  His first surveillance MRI performed on 06/02/2020 demonstrates no evidence of residual or recurrent disease.  There are a few scattered too small to characterize lesions also noted which warrants attention on follow-up imaging.  Continue post ablation surveillance.  1.)  Repeat MRI of the abdomen with gadolinium contrast and liver labs with AFP followed by clinic visit in 3 months (late October).    Electronically Signed: Jacqulynn Cadet 06/07/2020, 10:46 AM   I spent a total of 15 Minutes in remote  clinical consultation, greater than 50% of which was counseling/coordinating care for hepatocellular carcinoma.    Visit type: Audio only (telephone). Audio (no video) only due to patient request. Alternative for in-person consultation at [facility], [place] [address], [place], [place]. This visit type was conducted due to national recommendations for restrictions regarding the COVID-19 Pandemic (e.g. social distancing).  This format is felt to be most appropriate for this patient at this time.  All issues noted in this document were discussed and addressed.

## 2020-08-24 ENCOUNTER — Other Ambulatory Visit: Payer: Self-pay | Admitting: Interventional Radiology

## 2020-08-24 ENCOUNTER — Other Ambulatory Visit: Payer: Self-pay

## 2020-08-24 DIAGNOSIS — C220 Liver cell carcinoma: Secondary | ICD-10-CM

## 2020-08-30 LAB — COMPLETE METABOLIC PANEL WITH GFR
AG Ratio: 1.5 (calc) (ref 1.0–2.5)
ALT: 9 U/L (ref 9–46)
AST: 16 U/L (ref 10–35)
Albumin: 4.4 g/dL (ref 3.6–5.1)
Alkaline phosphatase (APISO): 81 U/L (ref 35–144)
BUN: 15 mg/dL (ref 7–25)
CO2: 26 mmol/L (ref 20–32)
Calcium: 9.7 mg/dL (ref 8.6–10.3)
Chloride: 108 mmol/L (ref 98–110)
Creat: 1.24 mg/dL (ref 0.70–1.25)
GFR, Est African American: 72 mL/min/{1.73_m2} (ref >=60)
GFR, Est Non African American: 62 mL/min/{1.73_m2} (ref >=60)
Globulin: 3 g/dL (calc) (ref 1.9–3.7)
Glucose, Bld: 91 mg/dL (ref 65–139)
Potassium: 4.2 mmol/L (ref 3.5–5.3)
Sodium: 140 mmol/L (ref 135–146)
Total Bilirubin: 0.4 mg/dL (ref 0.2–1.2)
Total Protein: 7.4 g/dL (ref 6.1–8.1)

## 2020-08-30 LAB — CBC
HCT: 38.1 % — ABNORMAL LOW (ref 38.5–50.0)
Hemoglobin: 12.7 g/dL — ABNORMAL LOW (ref 13.2–17.1)
MCH: 31.5 pg (ref 27.0–33.0)
MCHC: 33.3 g/dL (ref 32.0–36.0)
MCV: 94.5 fL (ref 80.0–100.0)
MPV: 10.6 fL (ref 7.5–12.5)
Platelets: 205 10*3/uL (ref 140–400)
RBC: 4.03 10*6/uL — ABNORMAL LOW (ref 4.20–5.80)
RDW: 12.3 % (ref 11.0–15.0)
WBC: 5.8 10*3/uL (ref 3.8–10.8)

## 2020-08-30 LAB — PROTIME-INR
INR: 1
Prothrombin Time: 11.1 s (ref 9.0–11.5)

## 2020-08-30 LAB — AFP TUMOR MARKER: AFP-Tumor Marker: 2.5 ng/mL (ref <6.1)

## 2020-09-02 ENCOUNTER — Ambulatory Visit (HOSPITAL_COMMUNITY)
Admission: RE | Admit: 2020-09-02 | Discharge: 2020-09-02 | Disposition: A | Discharge status: Home / Self Care | Payer: Commercial Managed Care - PPO | Source: Ambulatory Visit | Attending: Interventional Radiology | Admitting: Interventional Radiology

## 2020-09-02 ENCOUNTER — Other Ambulatory Visit: Payer: Self-pay

## 2020-09-02 DIAGNOSIS — C220 Liver cell carcinoma: Secondary | ICD-10-CM | POA: Insufficient documentation

## 2020-09-02 IMAGING — MR MR ABDOMEN WO/W CM
18 series · 48 of 48 positions shown · IV contrast (gadavist)
Comparison: Prior exam [DATE] and previous exam [DATE]

CLINICAL DATA: Hepatocellular carcinoma, follow-up post microwave
ablation of lesions in [DATE], follow-up of nonspecific
lesions from MRI dated [DATE].

EXAM:
MRI ABDOMEN WITHOUT AND WITH CONTRAST
TECHNIQUE: Multiplanar multisequence MR imaging of the abdomen was performed
both before and after the administration of intravenous contrast.
CONTRAST:  6mL GADAVIST GADOBUTROL 1 MMOL/ML IV SOLN

[Series 3: T2 · coronal · 6.0mm · 1.27mm/px · 2 of 29 slices shown (1 of 2)]
[im 1/29]
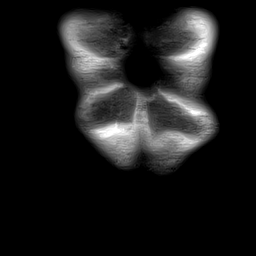
[im 29/29]
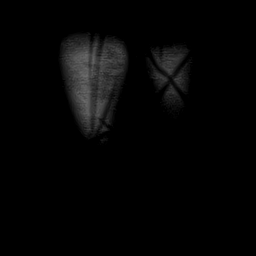

[Series 5: T2 fat-sat · axial · 6.0mm · 1.02mm/px · z∈[-184,+68]mm · 2 of 36 slices shown]
[im 1/36]
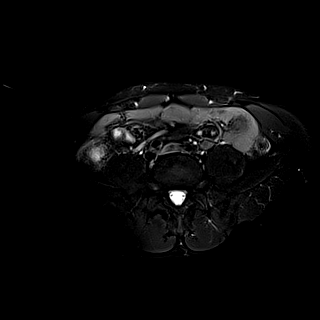
[im 36/36]
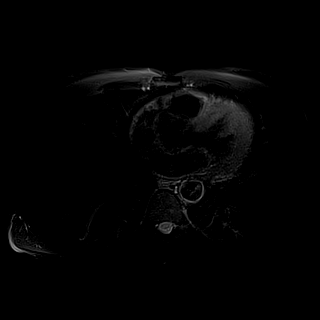

[Series 6: T1 · axial · 3.0mm · 1.02mm/px · z∈[-169,+68]mm · 4 of 80 slices shown (1 of 2)]
[im 1/80]
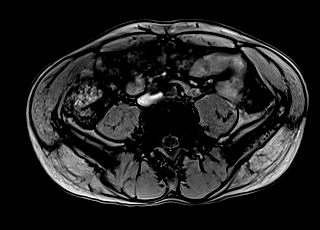
[im 27/80]
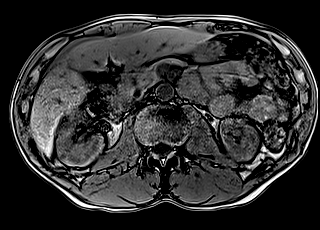
[im 53/80]
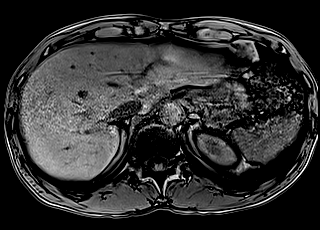
[im 80/80]
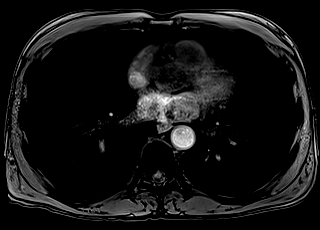

[Series 7: T1 · axial · 3.0mm · 1.02mm/px · z∈[-169,+68]mm · 3 of 80 slices shown (2 of 2)]
[im 1/80]
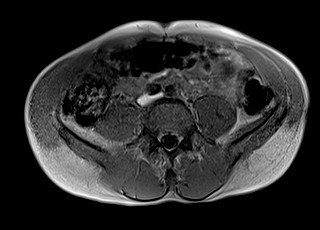
[im 40/80]
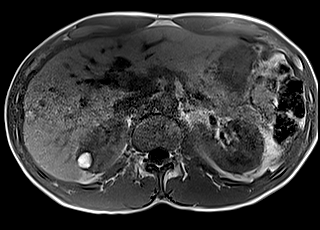
[im 80/80]
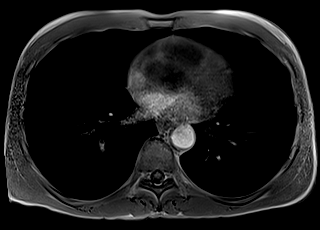

[Series 8: DWI · axial · 6.0mm · 1.36mm/px · z∈[-181,+71]mm · 3 of 71 slices shown (1 of 2)]
[im 1/71]
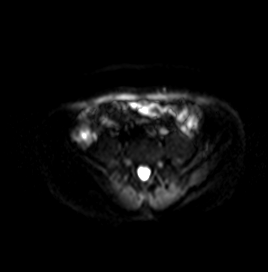
[im 36/71]
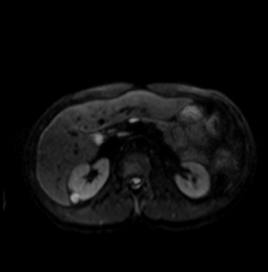
[im 71/71]
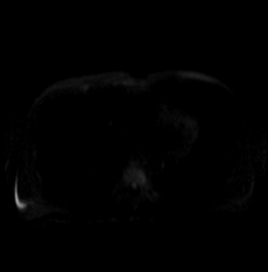

[Series 9: DWI · axial · 6.0mm · 1.36mm/px · 1 of 36 slices shown (2 of 2)]
[im 1/36]
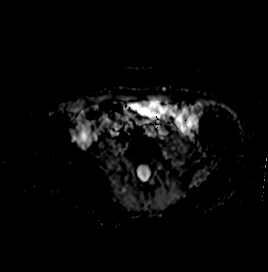

[Series 10: bSSFP · axial · 4.0mm · 0.63mm/px · z∈[-163,+73]mm · 2 of 60 slices shown]
[im 1/60]
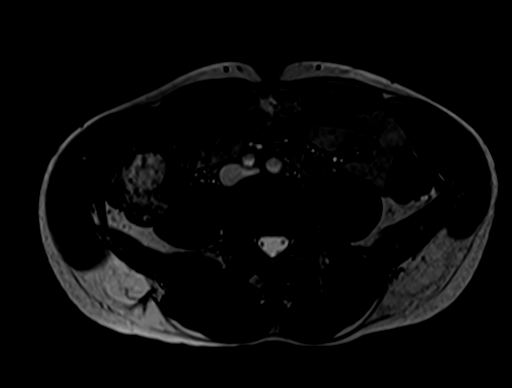
[im 60/60]
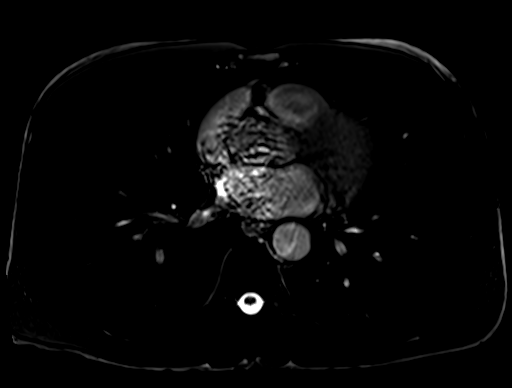

[Series 12: T1 dynamic · axial · 3.0mm · 1.02mm/px · z∈[-184,+77]mm · 3 of 88 slices shown (1 of 6)]
[im 1/88]
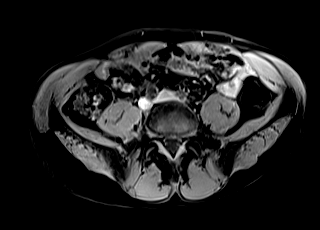
[im 44/88]
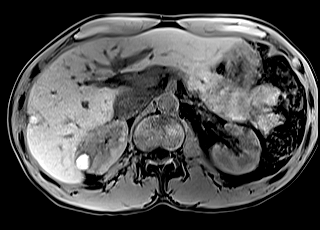
[im 88/88]
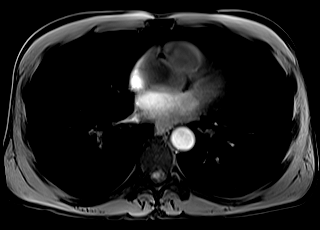

[Series 15: T1 dynamic · axial · 3.0mm · 1.02mm/px · z∈[-184,+77]mm · 3 of 88 slices shown (2 of 6)]
[im 1/88]
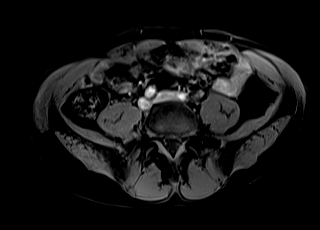
[im 44/88]
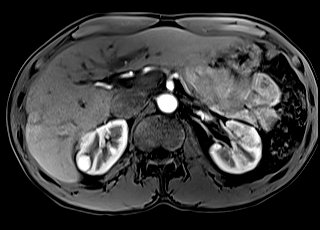
[im 88/88]
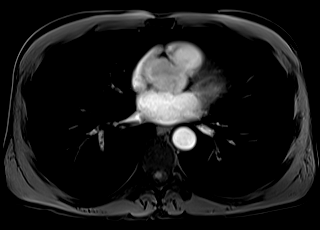

[Series 17: T1 dynamic · axial · 3.0mm · 1.02mm/px · z∈[-184,+77]mm · 3 of 88 slices shown (3 of 6)]
[im 1/88]
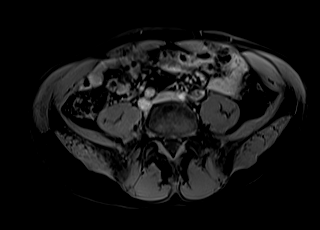
[im 44/88]
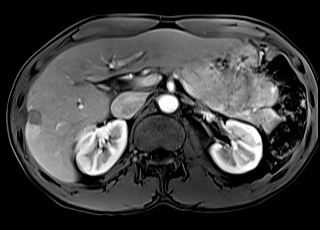
[im 88/88]
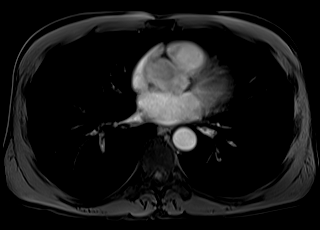

[Series 19: T1 dynamic · axial · 3.0mm · 1.02mm/px · z∈[-184,+77]mm · 3 of 88 slices shown (4 of 6)]
[im 1/88]
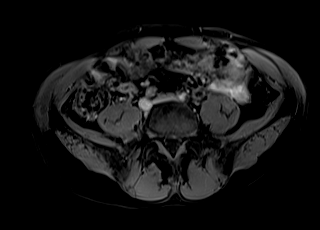
[im 44/88]
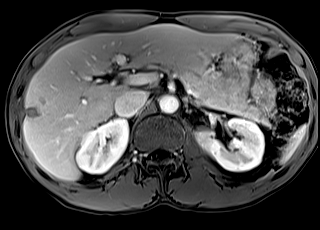
[im 88/88]
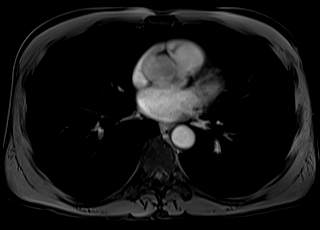

[Series 21: T1 dynamic · coronal · 3.0mm · 1.02mm/px · 3 of 72 slices shown (5 of 6)]
[im 1/72]
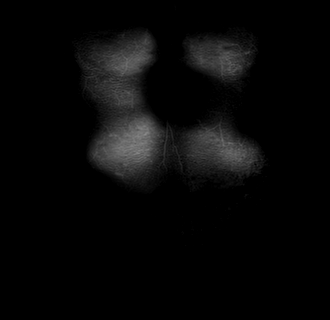
[im 36/72]
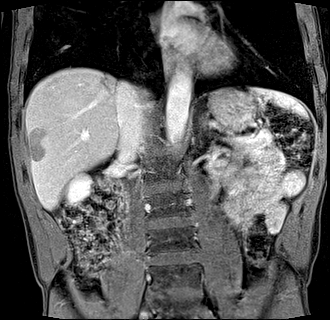
[im 72/72]
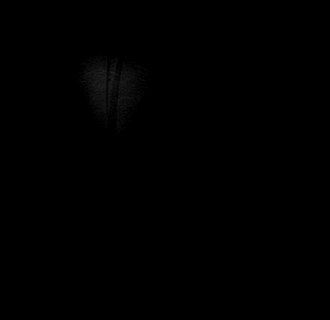

[Series 22: T2 · axial · 6.0mm · 1.27mm/px · 1 of 38 slices shown (2 of 2)]
[im 1/38]
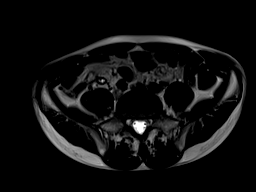

[Series 24: T1 dynamic · axial · 3.0mm · 1.02mm/px · z∈[-184,+77]mm · 3 of 88 slices shown (6 of 6)]
[im 1/88]
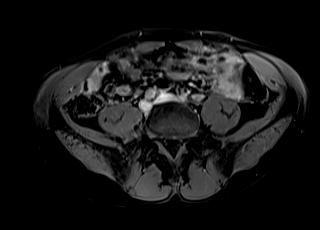
[im 44/88]
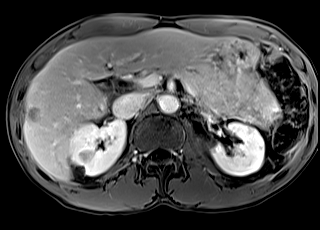
[im 88/88]
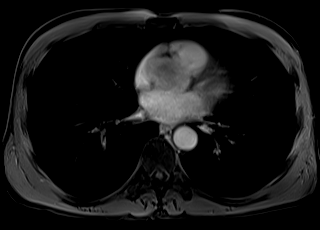

[Series 100: arterial sub · axial · arterial · 3.0mm · 1.02mm/px · z∈[-184,+77]mm · 3 of 88 slices shown]
[im 1/88]
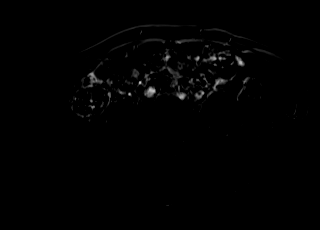
[im 44/88]
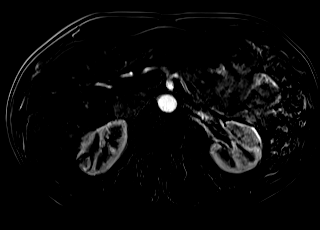
[im 88/88]
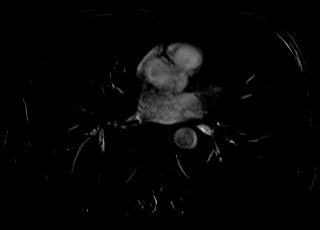

[Series 101: 45 sec sub · axial · 3.0mm · 1.02mm/px · z∈[-184,+77]mm · 3 of 88 slices shown]
[im 1/88]
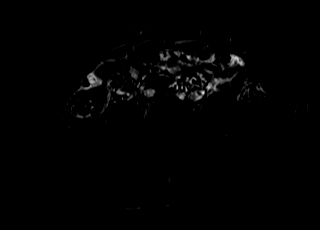
[im 44/88]
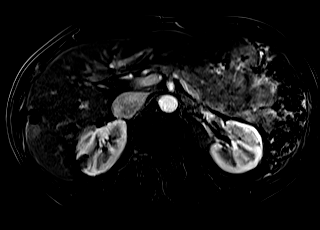
[im 88/88]
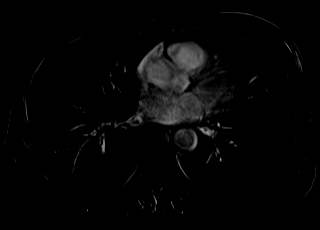

[Series 102: 90 sec sub · axial · 3.0mm · 1.02mm/px · z∈[-184,+77]mm · 3 of 88 slices shown]
[im 1/88]
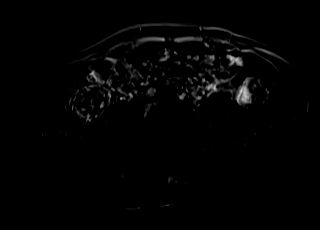
[im 44/88]
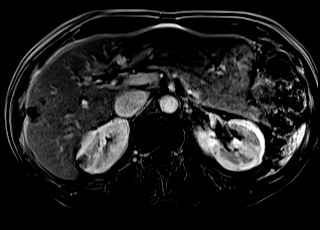
[im 88/88]
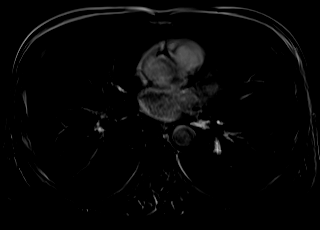

[Series 103: delay sub · axial · delayed · 3.0mm · 1.02mm/px · z∈[-184,+77]mm · 3 of 88 slices shown]
[im 1/88]
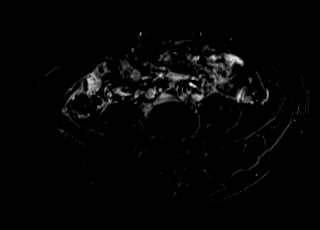
[im 44/88]
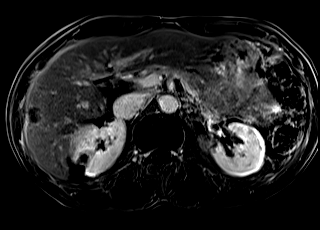
[im 88/88]
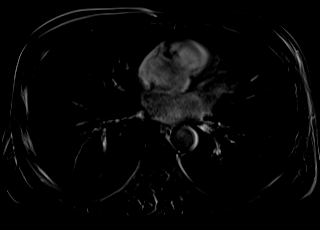

[48 of 48 positions shown; findings below may reference images not displayed]

FINDINGS: Lower chest: Incidental imaging of the lung bases is unremarkable
with limited assessment on MRI. No consolidation or effusion.

Hepatobiliary: Stable mild dilation of the biliary tree. Normal
appearance of the gallbladder.

Liver without cirrhotic morphologic characteristics.

Post radiofrequency ablation along the margin of the RIGHT hemi
liver. Heterogeneous appearance of this area on both T1 and T2.

Observation 1 ablated area with baseline T1 hyperintensity measures
1.8 x 1.4 cm and shows no sign of residual enhancement. When
measured in a similar fashion on the prior study this measured
approximately 2.3 x 1.6 cm.

No sign of enhancement in the liver to suggest additional lesion.
Findings on the previous exam of questionable TIGER 3 lesions are
not seen on the current evaluation. A cyst is demonstrated anterior
to the ablation zone. Perfusional anomalies surrounding the ablated
area have resolved.

Observation 2: Very subtle focus of restricted diffusion in the
cephalad lateral segment LEFT hepatic lobe (image 45 of series 8)
measuring 7 mm, 6 corresponding to low signal on image 27 of series
21. Arterial enhancement is not seen in this area, but this areas
not well evaluated on axial data sets. This observation is not seen
on previous imaging though this could be related to technical
factors rather than representative of open "new lesion".

Pancreas: Pancreas with normal intrinsic T1 signal without
suspicious focal pancreatic lesion or ductal dilation.

Spleen:  Spleen normal in size and contour.

Adrenals/Urinary Tract: Adrenal glands are normal. The cysts in the
bilateral kidneys, some hemorrhagic and/or proteinaceous, for
instance along the RIGHT lateral kidney a homogeneous area of T2
hypointensity corresponds to T1 hyperintensity measuring
approximately 1.2 cm and showing no enhancement.

Stomach/Bowel: Gastrointestinal tract is normal to the extent
evaluated. Pelvic bowel loops are not imaged. Stool throughout much
of the colon.

Vascular/Lymphatic: Signs of vascular disease without aneurysmal
dilation. No adenopathy in the abdomen.

Other:  No ascites.

Musculoskeletal: No suspicious bone lesions identified.
IMPRESSION: 1. Post radiofrequency ablation along the margin of the RIGHT hemi
liver. Heterogeneous appearance of this area on both T1 and T2. No
sign of residual enhancement. LR TR nonviable.
2. Very subtle focus of restricted diffusion in the cephalad lateral
segment LEFT hepatic lobe measuring 7 mm, corresponding to low
signal on image 27 of series 21. Arterial enhancement is not seen in
this area and this areas not seen on prior imaging. Not clear
whether this represents a new lesion as this area is obscured
largely by artifact on most sequences on today's study and on
previous imaging evaluations. LR category 3, consider a 3 to
six-month follow-up with abdominal MRI.
3. Lesions of concern scattered about the RIGHT hepatic lobe may
represent perfusional anomalies as they were not seen on today's
study.
4. Stable mild dilation of the biliary tree.

## 2020-09-02 MED ORDER — GADOBUTROL 1 MMOL/ML IV SOLN
6.0000 mL | Freq: Once | INTRAVENOUS | Status: AC | PRN
  Administered 2020-09-02: 6 mL via INTRAVENOUS

## 2020-09-05 ENCOUNTER — Encounter: Payer: Self-pay | Admitting: *Deleted

## 2020-09-05 ENCOUNTER — Ambulatory Visit
Admission: RE | Admit: 2020-09-05 | Discharge: 2020-09-05 | Disposition: A | Discharge status: Home / Self Care | Payer: Commercial Managed Care - PPO | Source: Ambulatory Visit | Attending: Interventional Radiology | Admitting: Interventional Radiology

## 2020-09-05 ENCOUNTER — Other Ambulatory Visit: Payer: Self-pay

## 2020-09-05 DIAGNOSIS — C220 Liver cell carcinoma: Secondary | ICD-10-CM

## 2020-09-05 HISTORY — PX: IR RADIOLOGIST EVAL & MGMT: IMG5224

## 2020-09-05 NOTE — Progress Notes (Signed)
Chief Complaint: Patient was consulted remotely today (TeleHealth) for hepatocellular cancer at the request of Baani Bober K.    Referring Physician(s): Roosevelt Locks, NP  History of Present Illness: Corey Barnhill Sr. is a 64 y.o. male with a history of hepatitis C now status post 12-week treatment with Epclusa with a sustained virologic response and cure. Prior to his treatment, he was confirmed to have F4 fibrosis. Surveillance imaging demonstrated a small indeterminate solid lesion measuring 8 mm in October 2019. Follow-up ultrasound in July 2020 demonstrated slight interval enlargement to 9 mm. Dedicated MRI with Eovist contrast performed 12/08/2019 demonstrated 2 small lesions in the periphery of the right hepatic lobe which demonstrated imaging characteristics consistent with BI-RADS Category 5 definitive hepatocellular carcinoma. The lesions measure 11 and 10 mm respectively.  He underwent successful percutaneous microwave ablation of the 2 lesions on 03/01/2020.  His postprocedural course was not complicated and he has recovered fully.  He is currently asymptomatic and doing well.  He denies abdominal pain, nausea, vomiting, decreased appetite, fever or chills.  Initial surveillance MRI dated 06/02/2020 demonstrates successful ablation of the 2 adjacent sites in hepatic segment 6 with no residual contrast enhancement.  A few scattered subcentimeter foci of arterial hyperenhancement are also noted along the anterior right hemiliver measuring no more than 6 mm consistent with BI-RADS Category 3, nonspecific lesions.    MRI 09/02/20 - no evidence of residual or recurrent disease at the prior microwave ablation site.  Areas of arterial hyperenhancement seen on a prior study not visible on today's study suggesting that they simply represented incidental areas of perfusion defect.  Subtle 7 mm focus of restricted diffusion in the most superior and central aspect of the lateral  segment of the left lobe warrants attention on follow-up imaging.  Past Medical History:  Diagnosis Date  . Cancer ([place])   . Hepatitis    HX OF hEP c ?   . Murmur, heart 1963    Past Surgical History:  Procedure Laterality Date  . IR RADIOLOGIST EVAL & MGMT  01/25/2020  . IR RADIOLOGIST EVAL & MGMT  03/21/2020  . IR RADIOLOGIST EVAL & MGMT  06/07/2020  . RADIOLOGY WITH ANESTHESIA N/A 03/01/2020   Procedure: CT WITH ANESTHESIA  THERMAL MICROWAVE  ABLATIION;  Surgeon: Jacqulynn Cadet, MD;  Location: [facility];  Service: Anesthesiology;  Laterality: N/A;    Allergies: Patient has no known allergies.  Medications: Prior to Admission medications   Medication Sig Start Date End Date Taking? Authorizing Provider  cetirizine (ZYRTEC) 10 MG tablet Take 10 mg by mouth daily as needed for allergies.    [provider]  loratadine (CLARITIN) 10 MG tablet Take 10 mg by mouth daily as needed for allergies.    [provider]  oxyCODONE-acetaminophen (PERCOCET) 5-325 MG tablet Take 1 tablet by mouth every 6 (six) hours as needed for moderate pain or severe pain. 03/02/20   Louk, Bea Graff, PA-C     Family History  Problem Relation Age of Onset  . Arthritis Mother   . Cancer Neg Hx   . Diabetes Neg Hx   . Drug abuse Neg Hx   . Heart disease Neg Hx   . Hyperlipidemia Neg Hx   . Hypertension Neg Hx   . Kidney disease Neg Hx   . Stroke Neg Hx     Social History   Socioeconomic History  . Marital status: Divorced    Spouse name: Not on file  . Number  of children: 2  . Years of education: 22  . Highest education level: Not on file  Occupational History  . Not on file  Tobacco Use  . Smoking status: Current Some Day Smoker    Types: Cigars  . Smokeless tobacco: Never Used  . Tobacco comment: 3 per week  Vaping Use  . Vaping Use: Never used  Substance and Sexual Activity  . Alcohol use: Not Currently  . Drug use: No  . Sexual activity: Not Currently  Other  Topics Concern  . Not on file  Social History Narrative  . Not on file   Social Determinants of Health   Financial Resource Strain:   . Difficulty of Paying Living Expenses: Not on file  Food Insecurity:   . Worried About Charity fundraiser in the Last Year: Not on file  . Ran Out of Food in the Last Year: Not on file  Transportation Needs:   . Lack of Transportation (Medical): Not on file  . Lack of Transportation (Non-Medical): Not on file  Physical Activity:   . Days of Exercise per Week: Not on file  . Minutes of Exercise per Session: Not on file  Stress:   . Feeling of Stress : Not on file  Social Connections:   . Frequency of Communication with Friends and Family: Not on file  . Frequency of Social Gatherings with Friends and Family: Not on file  . Attends Religious Services: Not on file  . Active Member of Clubs or Organizations: Not on file  . Attends Archivist Meetings: Not on file  . Marital Status: Not on file    ECOG Status: 0 - Asymptomatic  Review of Systems  Review of Systems: A 12 point ROS discussed and pertinent positives are indicated in the HPI above.  All other systems are negative.  Physical Exam No direct physical exam was performed (except for noted visual exam findings with Video Visits).   Vital Signs: There were no vitals taken for this visit.  Imaging: MR ABDOMEN WWO CONTRAST  Result Date: 09/02/2020 CLINICAL DATA:  Hepatocellular carcinoma, follow-up post microwave ablation of lesions in April of 2021, follow-up of nonspecific lesions from MRI dated 06/02/2020. EXAM: MRI ABDOMEN WITHOUT AND WITH CONTRAST TECHNIQUE: Multiplanar multisequence MR imaging of the abdomen was performed both before and after the administration of intravenous contrast. CONTRAST:  23mL GADAVIST GADOBUTROL 1 MMOL/ML IV SOLN COMPARISON:  Prior exam June 02, 2020 and previous exam of December 08, 2019 FINDINGS: Lower chest: Incidental imaging of the lung bases  is unremarkable with limited assessment on MRI. No consolidation or effusion. Hepatobiliary: Stable mild dilation of the biliary tree. Normal appearance of the gallbladder. Liver without cirrhotic morphologic characteristics. Post radiofrequency ablation along the margin of the RIGHT hemi liver. Heterogeneous appearance of this area on both T1 and T2. Observation 1 ablated area with baseline T1 hyperintensity measures 1.8 x 1.4 cm and shows no sign of residual enhancement. When measured in a similar fashion on the prior study this measured approximately 2.3 x 1.6 cm. No sign of enhancement in the liver to suggest additional lesion. Findings on the previous exam of questionable LI-RADS 3 lesions are not seen on the current evaluation. A cyst is demonstrated anterior to the ablation zone. Perfusional anomalies surrounding the ablated area have resolved. Observation 2: Very subtle focus of restricted diffusion in the cephalad lateral segment LEFT hepatic lobe (image 45 of series 8) measuring 7 mm, 6 corresponding to low signal  on image 27 of series 21. Arterial enhancement is not seen in this area, but this areas not well evaluated on axial data sets. This observation is not seen on previous imaging though this could be related to technical factors rather than representative of open "new lesion". Pancreas: Pancreas with normal intrinsic T1 signal without suspicious focal pancreatic lesion or ductal dilation. Spleen:  Spleen normal in size and contour. Adrenals/Urinary Tract: Adrenal glands are normal. The cysts in the bilateral kidneys, some hemorrhagic and/or proteinaceous, for instance along the RIGHT lateral kidney a homogeneous area of T2 hypointensity corresponds to T1 hyperintensity measuring approximately 1.2 cm and showing no enhancement. Stomach/Bowel: Gastrointestinal tract is normal to the extent evaluated. Pelvic bowel loops are not imaged. Stool throughout much of the colon. Vascular/Lymphatic: Signs of  vascular disease without aneurysmal dilation. No adenopathy in the abdomen. Other:  No ascites. Musculoskeletal: No suspicious bone lesions identified. IMPRESSION: 1. Post radiofrequency ablation along the margin of the RIGHT hemi liver. Heterogeneous appearance of this area on both T1 and T2. No sign of residual enhancement. LR TR nonviable. 2. Very subtle focus of restricted diffusion in the cephalad lateral segment LEFT hepatic lobe measuring 7 mm, corresponding to low signal on image 27 of series 21. Arterial enhancement is not seen in this area and this areas not seen on prior imaging. Not clear whether this represents a new lesion as this area is obscured largely by artifact on most sequences on today's study and on previous imaging evaluations. LR category 3, consider a 3 to six-month follow-up with abdominal MRI. 3. Lesions of concern scattered about the RIGHT hepatic lobe may represent perfusional anomalies as they were not seen on today's study. 4. Stable mild dilation of the biliary tree. Electronically Signed   By: Zetta Bills M.D.   On: 09/02/2020 14:03    Labs:  CBC: Recent Labs    03/01/20 0741 05/16/20 1008 08/29/20 1553  WBC 6.1 5.6 5.8  HGB 13.3 13.2 12.7*  HCT 41.5 39.1 38.1*  PLT 247 211 205    COAGS: Recent Labs    03/01/20 0741 05/16/20 1008 08/29/20 1553  INR 1.0 1.0 1.0    BMP: Recent Labs    03/01/20 0741 05/16/20 1008 08/29/20 1553  NA 142 141 140  K 4.4 4.8 4.2  CL 109 107 108  CO2 24 27 26   GLUCOSE 100* 89 91  BUN 19 13 15   CALCIUM 9.6 9.6 9.7  CREATININE 1.16 1.12 1.24  GFRNONAA >60 70 62  GFRAA >60 81 72    LIVER FUNCTION TESTS: Recent Labs    03/01/20 0741 05/16/20 1008 08/29/20 1553  BILITOT 0.4 0.4 0.4  AST 20 16 16   ALT 16 9 9   ALKPHOS 76  --   --   PROT 7.9 7.6 7.4  ALBUMIN 4.3  --   --     TUMOR MARKERS: Recent Labs    05/16/20 1008 08/29/20 1553  AFPTM 2.1 2.5    Assessment and Plan:  Doing exceptionally well  39-month status post percutaneous microwave ablation of hepatocellular carcinoma.  Clinically, he is doing well.  His most recent follow-up MRI imaging demonstrates no evidence of residual or recurrent disease at the prior treatment site.  Additionally, the multifocal regions of interest scattered throughout the right hepatic lobe are not visible on the most recent study suggesting that they simply represented areas of perfusion abnormality on the prior study.  There is one 7 mm focus of subtle restricted diffusion  centrally within the cephalad lateral segment of the left hepatic lobe which warrants attention on follow-up imaging.  1.)  Repeat gadolinium enhanced MRI of the abdomen and clinic visit in 3 months.    Electronically Signed: Criselda Peaches 09/05/2020, 11:56 AM   I spent a total of  15 Minutes in remote  clinical consultation, greater than 50% of which was counseling/coordinating care for hepatocellular cancer.    Visit type: Audio only (telephone). Audio (no video) only due to patient preference. Alternative for in-person consultation at [facility], [address] [place], [place], [place]. This visit type was conducted due to national recommendations for restrictions regarding the COVID-19 Pandemic (e.g. social distancing).  This format is felt to be most appropriate for this patient at this time.  All issues noted in this document were discussed and addressed.

## 2020-11-09 ENCOUNTER — Other Ambulatory Visit: Payer: Self-pay | Admitting: Interventional Radiology

## 2020-11-09 DIAGNOSIS — C220 Liver cell carcinoma: Secondary | ICD-10-CM

## 2020-11-14 ENCOUNTER — Other Ambulatory Visit: Payer: Self-pay | Admitting: *Deleted

## 2020-11-14 DIAGNOSIS — K769 Liver disease, unspecified: Secondary | ICD-10-CM

## 2020-11-15 LAB — COMPLETE METABOLIC PANEL WITH GFR
AG Ratio: 1.3 (calc) (ref 1.0–2.5)
ALT: 10 U/L (ref 9–46)
AST: 17 U/L (ref 10–35)
Albumin: 4.7 g/dL (ref 3.6–5.1)
Alkaline phosphatase (APISO): 79 U/L (ref 35–144)
BUN/Creatinine Ratio: 13 (calc) (ref 6–22)
BUN: 17 mg/dL (ref 7–25)
CO2: 27 mmol/L (ref 20–32)
Calcium: 10 mg/dL (ref 8.6–10.3)
Chloride: 107 mmol/L (ref 98–110)
Creat: 1.27 mg/dL — ABNORMAL HIGH (ref 0.70–1.25)
GFR, Est African American: 70 mL/min/{1.73_m2} (ref >=60)
GFR, Est Non African American: 60 mL/min/{1.73_m2} (ref >=60)
Globulin: 3.6 g/dL (calc) (ref 1.9–3.7)
Glucose, Bld: 95 mg/dL (ref 65–139)
Potassium: 4.6 mmol/L (ref 3.5–5.3)
Sodium: 140 mmol/L (ref 135–146)
Total Bilirubin: 0.4 mg/dL (ref 0.2–1.2)
Total Protein: 8.3 g/dL — ABNORMAL HIGH (ref 6.1–8.1)

## 2020-11-15 LAB — CBC
HCT: 42.3 % (ref 38.5–50.0)
Hemoglobin: 14.2 g/dL (ref 13.2–17.1)
MCH: 31.6 pg (ref 27.0–33.0)
MCHC: 33.6 g/dL (ref 32.0–36.0)
MCV: 94 fL (ref 80.0–100.0)
MPV: 10.8 fL (ref 7.5–12.5)
Platelets: 224 10*3/uL (ref 140–400)
RBC: 4.5 10*6/uL (ref 4.20–5.80)
RDW: 12.3 % (ref 11.0–15.0)
WBC: 5.6 10*3/uL (ref 3.8–10.8)

## 2020-11-15 LAB — AFP TUMOR MARKER: AFP-Tumor Marker: 2.3 ng/mL (ref <6.1)

## 2020-11-15 LAB — PROTIME-INR
INR: 1
Prothrombin Time: 11.1 s (ref 9.0–11.5)

## 2020-11-27 ENCOUNTER — Ambulatory Visit (HOSPITAL_COMMUNITY)
Admission: RE | Admit: 2020-11-27 | Discharge: 2020-11-27 | Disposition: A | Discharge status: Home / Self Care | Payer: Commercial Managed Care - PPO | Source: Ambulatory Visit | Attending: Interventional Radiology | Admitting: Interventional Radiology

## 2020-11-27 ENCOUNTER — Other Ambulatory Visit: Payer: Self-pay

## 2020-11-27 DIAGNOSIS — C220 Liver cell carcinoma: Secondary | ICD-10-CM | POA: Diagnosis not present

## 2020-11-27 IMAGING — MR MR ABDOMEN WO/W CM
18 series · 48 of 48 positions shown · IV contrast (gadavist)
Comparison: [DATE]

CLINICAL DATA: Follow-up hepatocellular carcinoma. Previous
microwave ablation.

EXAM:
MRI ABDOMEN WITHOUT AND WITH CONTRAST
TECHNIQUE: Multiplanar multisequence MR imaging of the abdomen was performed
both before and after the administration of intravenous contrast.
CONTRAST:  7mL GADAVIST GADOBUTROL 1 MMOL/ML IV SOLN

[Series 3: T2 · coronal · 6.0mm · 1.56mm/px · 2 of 30 slices shown (1 of 2)]
[im 1/30]
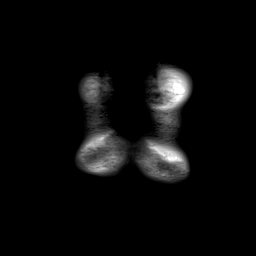
[im 30/30]
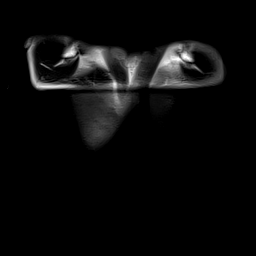

[Series 5: T2 fat-sat · axial · 6.0mm · 1.19mm/px · z∈[-202,+72]mm · 2 of 39 slices shown]
[im 1/39]
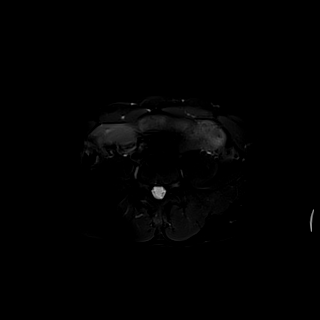
[im 39/39]
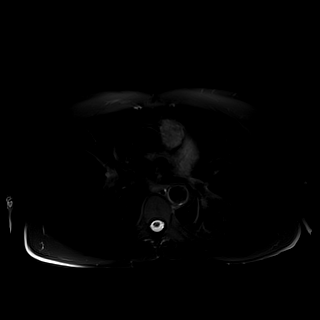

[Series 6: T1 · axial · 3.0mm · 1.25mm/px · z∈[-205,+56]mm · 4 of 88 slices shown (1 of 2)]
[im 1/88]
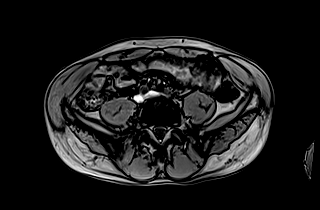
[im 30/88]
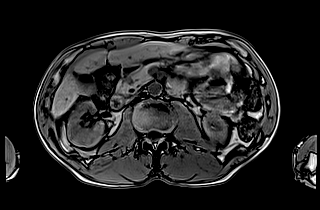
[im 59/88]
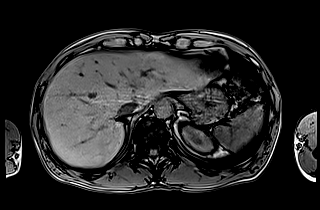
[im 88/88]
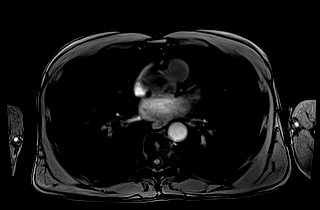

[Series 7: T1 · axial · 3.0mm · 1.25mm/px · z∈[-205,+56]mm · 4 of 88 slices shown (2 of 2)]
[im 1/88]
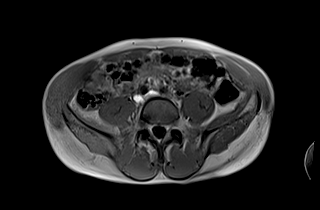
[im 30/88]
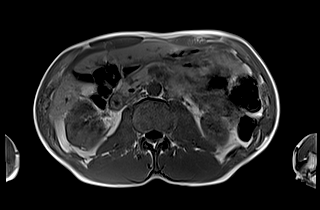
[im 59/88]
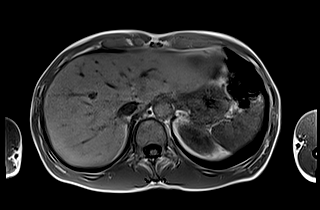
[im 88/88]
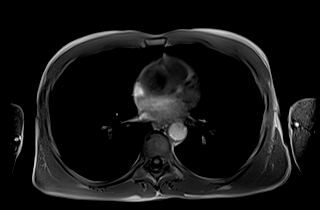

[Series 8: DWI · axial · 6.0mm · 1.49mm/px · z∈[-203,+49]mm · 3 of 72 slices shown (1 of 2)]
[im 1/72]
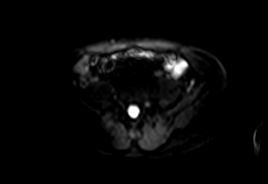
[im 36/72]
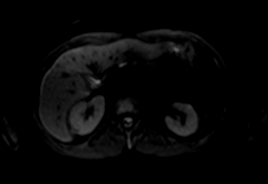
[im 72/72]
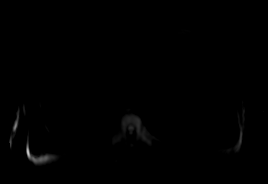

[Series 9: DWI · axial · 6.0mm · 1.49mm/px · 1 of 36 slices shown (2 of 2)]
[im 1/36]
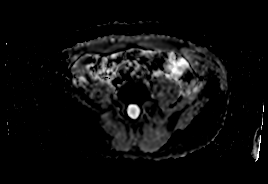

[Series 10: bSSFP · axial · 4.0mm · 0.74mm/px · z∈[-210,+46]mm · 2 of 65 slices shown]
[im 1/65]
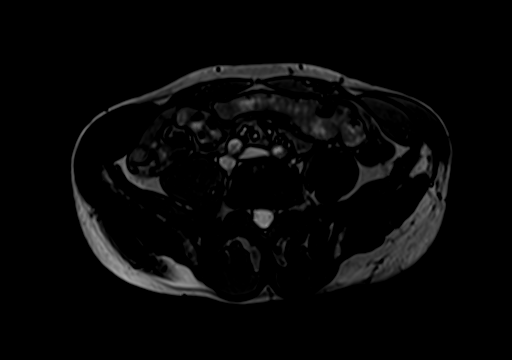
[im 65/65]
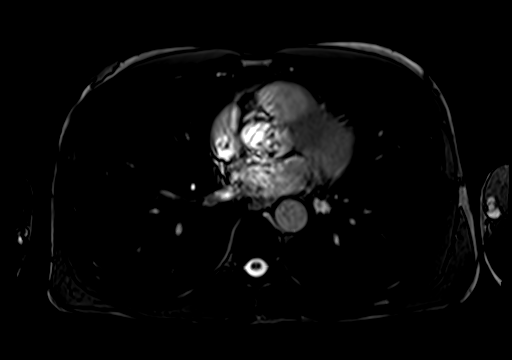

[Series 12: T1 dynamic · axial · 3.0mm · 1.19mm/px · z∈[-206,+55]mm · 3 of 88 slices shown (1 of 6)]
[im 1/88]
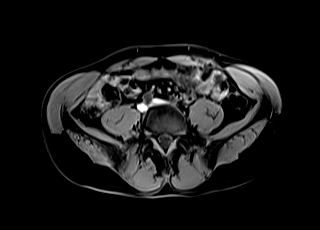
[im 44/88]
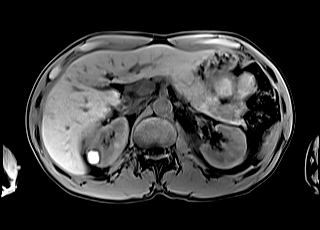
[im 88/88]
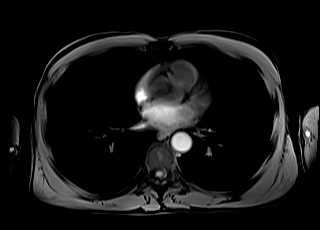

[Series 15: T1 dynamic · axial · 3.0mm · 1.19mm/px · z∈[-206,+55]mm · 3 of 88 slices shown (2 of 6)]
[im 1/88]
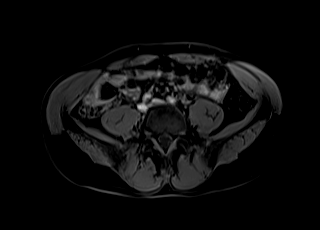
[im 44/88]
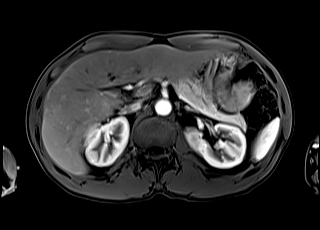
[im 88/88]
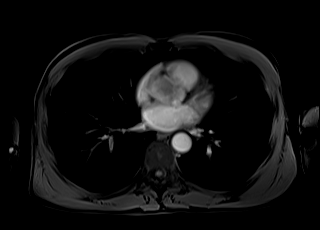

[Series 17: T1 dynamic · axial · 3.0mm · 1.19mm/px · z∈[-206,+55]mm · 3 of 88 slices shown (3 of 6)]
[im 1/88]
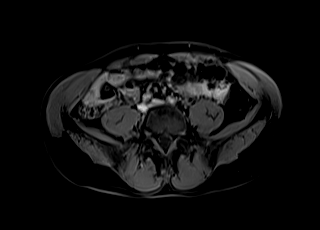
[im 44/88]
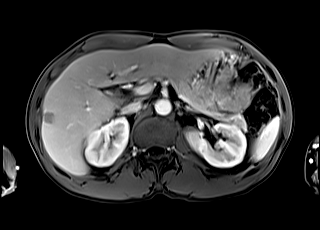
[im 88/88]
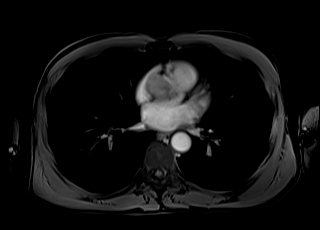

[Series 19: T1 dynamic · axial · 3.0mm · 1.19mm/px · z∈[-206,+55]mm · 3 of 88 slices shown (4 of 6)]
[im 1/88]
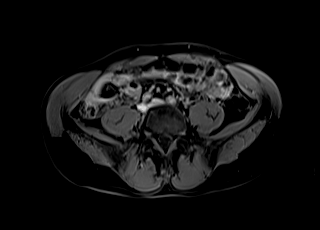
[im 44/88]
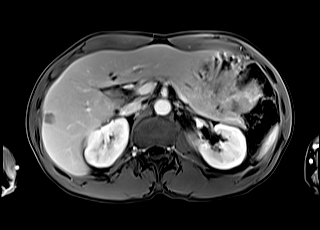
[im 88/88]
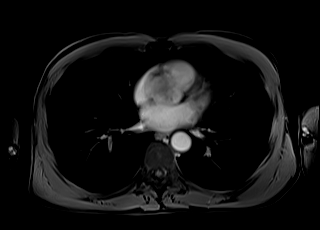

[Series 21: T1 dynamic · coronal · 4.0mm · 1.41mm/px · 2 of 56 slices shown (5 of 6)]
[im 1/56]
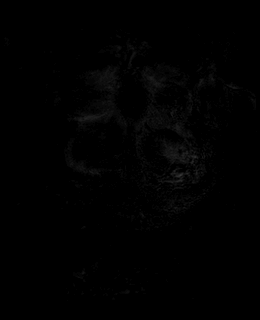
[im 56/56]
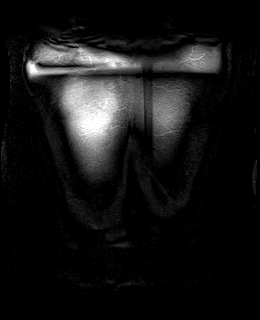

[Series 22: T2 · axial · 6.0mm · 1.48mm/px · z∈[-234,+68]mm · 2 of 43 slices shown (2 of 2)]
[im 1/43]
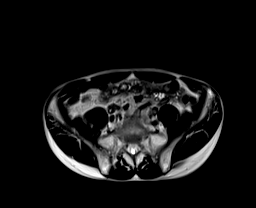
[im 43/43]
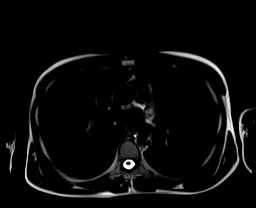

[Series 24: T1 dynamic · axial · 3.0mm · 1.19mm/px · z∈[-206,+55]mm · 3 of 88 slices shown (6 of 6)]
[im 1/88]
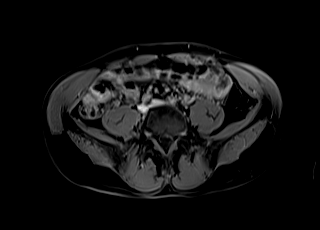
[im 44/88]
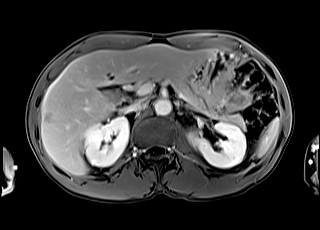
[im 88/88]
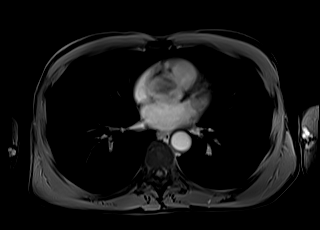

[Series 100: sub 20 sec · axial · 3.0mm · 1.19mm/px · z∈[-206,+55]mm · 3 of 88 slices shown]
[im 1/88]
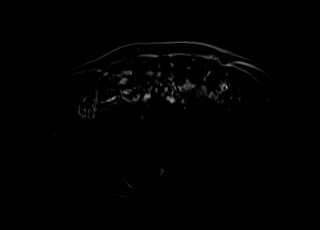
[im 44/88]
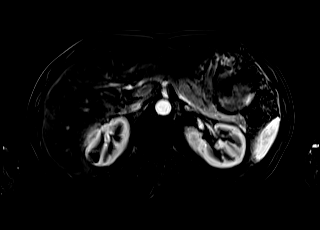
[im 88/88]
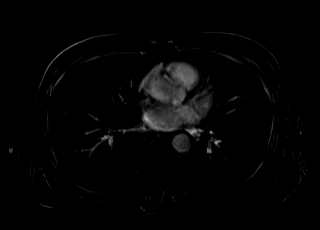

[Series 101: sub 45 sec · axial · 3.0mm · 1.19mm/px · z∈[-206,+55]mm · 3 of 88 slices shown]
[im 1/88]
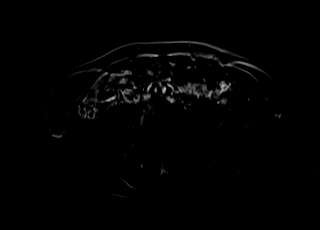
[im 44/88]
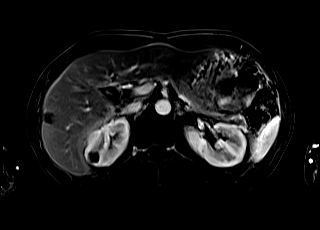
[im 88/88]
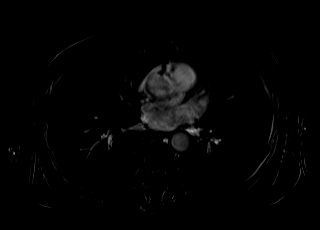

[Series 102: sub 90 sec · axial · 3.0mm · 1.19mm/px · z∈[-206,+55]mm · 3 of 88 slices shown]
[im 1/88]
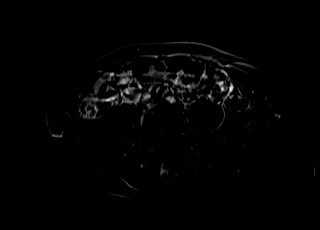
[im 44/88]
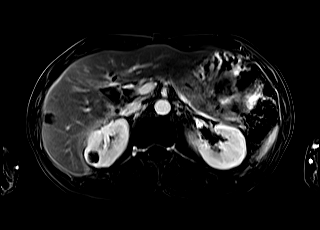
[im 88/88]
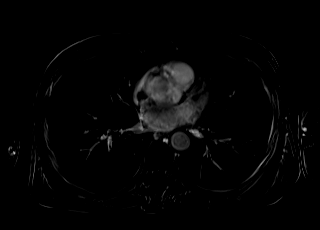

[Series 103: sub 3 min · axial · 3.0mm · 1.19mm/px · z∈[-122,+55]mm · 2 of 60 slices shown]
[im 1/60]
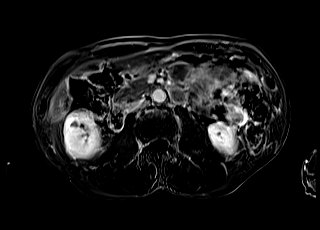
[im 60/60]
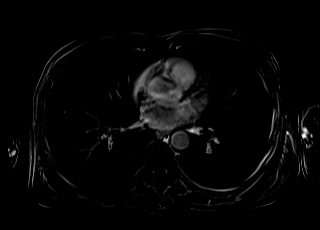

[48 of 48 positions shown; findings below may reference images not displayed]

FINDINGS: Lower chest: No acute findings.

Hepatobiliary: Ablation defect in the peripheral anterior right lobe
measures 2.0 x 1.2 cm on image 37/88, minimally decreased in size
since previous study. Tiny adjacent sub-cm cyst in the anterior
right hepatic lobe remains stable. A few tiny subcapsular foci of
arterial phase hyperenhancement are again seen mainly in the right
lobe. These are not seen on any other sequences including diffusion
imaging and are unchanged since prior exams. These likely represent
tiny perfusion anomalies. No suspicious hepatic masses are
identified. Gallbladder is unremarkable. No evidence of biliary
ductal dilatation.

Pancreas:  No mass or inflammatory changes.

Spleen:  Within normal limits in size and appearance.

Adrenals/Urinary Tract: Multiple small Bosniak category 1 and 2
renal cysts are again seen bilaterally. No masses identified. No
evidence of hydronephrosis.

Stomach/Bowel: Visualized portion unremarkable.

Vascular/Lymphatic: No pathologically enlarged lymph nodes
identified. No abdominal aortic aneurysm.

Other:  None.

Musculoskeletal:  No suspicious bone lesions identified.
IMPRESSION: Minimal decrease in size of ablation defect in the peripheral
anterior right hepatic lobe. No evidence of locally recurrent
hepatocellular carcinoma or other suspicious liver lesions.

No evidence of metastatic disease or other acute findings.

## 2020-11-27 MED ORDER — GADOBUTROL 1 MMOL/ML IV SOLN
7.0000 mL | Freq: Once | INTRAVENOUS | Status: AC | PRN
  Administered 2020-11-27: 7 mL via INTRAVENOUS

## 2020-12-05 ENCOUNTER — Encounter: Payer: Self-pay | Admitting: *Deleted

## 2020-12-05 ENCOUNTER — Other Ambulatory Visit: Payer: Self-pay

## 2020-12-05 ENCOUNTER — Ambulatory Visit
Admission: RE | Admit: 2020-12-05 | Discharge: 2020-12-05 | Disposition: A | Discharge status: Home / Self Care | Payer: Commercial Managed Care - PPO | Source: Ambulatory Visit | Attending: Interventional Radiology | Admitting: Interventional Radiology

## 2020-12-05 DIAGNOSIS — C220 Liver cell carcinoma: Secondary | ICD-10-CM

## 2020-12-05 HISTORY — PX: IR RADIOLOGIST EVAL & MGMT: IMG5224

## 2020-12-05 NOTE — Progress Notes (Signed)
Chief Complaint: Patient was consulted remotely today (TeleHealth) for  at the request of Corey Armstrong K.    Referring Physician(s): Annamarie Majorawn Drazek, NP  History of Present Illness: Corey DerReginald Levon Bogie Sr. is a 65 y.o. male with a history of hepatitis C now status post 12-week treatment with Epclusa with a sustained virologic response and cure. Prior to his treatment, he was confirmed to have F4 fibrosis. Surveillance imaging demonstrated a small indeterminate solid lesion measuring 8 mm in October 2019. Follow-up ultrasound in July 2020 demonstrated slight interval enlargement to 9 mm. Dedicated MRI with Eovist contrast performed 12/08/2019 demonstrated 2 small lesions in the periphery of the right hepatic lobe which demonstrated imaging characteristics consistent with BI-RADS Category 5 definitive hepatocellular carcinoma. The lesions measure 11 and 10 mm respectively.  He underwent successful percutaneous microwave ablation of the 2 lesions on 03/01/2020. His postprocedural course was not complicated and he has recovered fully. He is currently asymptomatic and doing well. He denies abdominal pain, nausea, vomiting, decreased appetite, fever or chills.  Initial surveillance MRI dated 06/02/2020 demonstrates successful ablation of the 2 adjacent sites in hepatic segment 6 with no residual contrast enhancement. A few scattered subcentimeter foci of arterial hyperenhancement are also noted along the anterior right hemiliver measuring no more than 6 mm consistent with BI-RADS Category 3, nonspecific lesions.   MRI 09/02/20 - no evidence of residual or recurrent disease at the prior microwave ablation site.  Areas of arterial hyperenhancement seen on a prior study not visible on today's study suggesting that they simply represented incidental areas of perfusion defect.  Subtle 7 mm focus of restricted diffusion in the most superior and central aspect of the lateral segment of the left  lobe warrants attention on follow-up imaging.  MRI 11/27/20 - No evidence of locally recurrent hepatocellular carcinoma or other suspicious liver lesions.    Past Medical History:  Diagnosis Date  . Cancer ([facility])   . Hepatitis    HX OF hEP c ?   . Murmur, heart 1963    Past Surgical History:  Procedure Laterality Date  . IR RADIOLOGIST EVAL & MGMT  01/25/2020  . IR RADIOLOGIST EVAL & MGMT  03/21/2020  . IR RADIOLOGIST EVAL & MGMT  06/07/2020  . IR RADIOLOGIST EVAL & MGMT  09/05/2020  . IR RADIOLOGIST EVAL & MGMT  12/05/2020  . RADIOLOGY WITH ANESTHESIA N/A 03/01/2020   Procedure: CT WITH ANESTHESIA  THERMAL MICROWAVE  ABLATIION;  Surgeon: Malachy MoanMcCullough, Latyra Jaye, MD;  Location: [facility];  Service: Anesthesiology;  Laterality: N/A;    Allergies: Patient has no known allergies.  Medications: Prior to Admission medications   Medication Sig Start Date End Date Taking? Authorizing Provider  cetirizine (ZYRTEC) 10 MG tablet Take 10 mg by mouth daily as needed for allergies.    [provider]  loratadine (CLARITIN) 10 MG tablet Take 10 mg by mouth daily as needed for allergies.    [provider]  oxyCODONE-acetaminophen (PERCOCET) 5-325 MG tablet Take 1 tablet by mouth every 6 (six) hours as needed for moderate pain or severe pain. 03/02/20   Louk, Waylan BogaAlexandra M, PA-C     Family History  Problem Relation Age of Onset  . Arthritis Mother   . Cancer Neg Hx   . Diabetes Neg Hx   . Drug abuse Neg Hx   . Heart disease Neg Hx   . Hyperlipidemia Neg Hx   . Hypertension Neg Hx   . Kidney disease Neg Hx   .  Stroke Neg Hx     Social History   Socioeconomic History  . Marital status: Divorced    Spouse name: Not on file  . Number of children: 2  . Years of education: 41  . Highest education level: Not on file  Occupational History  . Not on file  Tobacco Use  . Smoking status: Current Some Day Smoker    Types: Cigars  . Smokeless tobacco: Never Used  . Tobacco comment:  3 per week  Vaping Use  . Vaping Use: Never used  Substance and Sexual Activity  . Alcohol use: Not Currently  . Drug use: No  . Sexual activity: Not Currently  Other Topics Concern  . Not on file  Social History Narrative  . Not on file   Social Determinants of Health   Financial Resource Strain: Not on file  Food Insecurity: Not on file  Transportation Needs: Not on file  Physical Activity: Not on file  Stress: Not on file  Social Connections: Not on file    ECOG Status: 0 - Asymptomatic  Review of Systems  Review of Systems: A 12 point ROS discussed and pertinent positives are indicated in the HPI above.  All other systems are negative.  Physical Exam No direct physical exam was performed (except for noted visual exam findings with Video Visits).   Vital Signs: There were no vitals taken for this visit.  Imaging: MR ABDOMEN WWO CONTRAST  Result Date: 11/27/2020 CLINICAL DATA:  Follow-up hepatocellular carcinoma. Previous microwave ablation. EXAM: MRI ABDOMEN WITHOUT AND WITH CONTRAST TECHNIQUE: Multiplanar multisequence MR imaging of the abdomen was performed both before and after the administration of intravenous contrast. CONTRAST:  57mL GADAVIST GADOBUTROL 1 MMOL/ML IV SOLN COMPARISON:  09/02/2020 FINDINGS: Lower chest: No acute findings. Hepatobiliary: Ablation defect in the peripheral anterior right lobe measures 2.0 x 1.2 cm on image 37/88, minimally decreased in size since previous study. Tiny adjacent sub-cm cyst in the anterior right hepatic lobe remains stable. A few tiny subcapsular foci of arterial phase hyperenhancement are again seen mainly in the right lobe. These are not seen on any other sequences including diffusion imaging and are unchanged since prior exams. These likely represent tiny perfusion anomalies. No suspicious hepatic masses are identified. Gallbladder is unremarkable. No evidence of biliary ductal dilatation. Pancreas:  No mass or inflammatory  changes. Spleen:  Within normal limits in size and appearance. Adrenals/Urinary Tract: Multiple small Bosniak category 1 and 2 renal cysts are again seen bilaterally. No masses identified. No evidence of hydronephrosis. Stomach/Bowel: Visualized portion unremarkable. Vascular/Lymphatic: No pathologically enlarged lymph nodes identified. No abdominal aortic aneurysm. Other:  None. Musculoskeletal:  No suspicious bone lesions identified. IMPRESSION: Minimal decrease in size of ablation defect in the peripheral anterior right hepatic lobe. No evidence of locally recurrent hepatocellular carcinoma or other suspicious liver lesions. No evidence of metastatic disease or other acute findings. Electronically Signed   By: Marlaine Hind M.D.   On: 11/27/2020 09:55   IR Radiologist Eval & Mgmt  Result Date: 12/05/2020 Please refer to notes tab for details about interventional procedure. (Op Note)   Labs:  CBC: Recent Labs    03/01/20 0741 05/16/20 1008 08/29/20 1553 11/14/20 0958  WBC 6.1 5.6 5.8 5.6  HGB 13.3 13.2 12.7* 14.2  HCT 41.5 39.1 38.1* 42.3  PLT 247 211 205 224    COAGS: Recent Labs    03/01/20 0741 05/16/20 1008 08/29/20 1553 11/14/20 0958  INR 1.0 1.0 1.0 1.0  BMP: Recent Labs    03/01/20 0741 05/16/20 1008 08/29/20 1553 11/14/20 0958  NA 142 141 140 140  K 4.4 4.8 4.2 4.6  CL 109 107 108 107  CO2 24 27 26 27   GLUCOSE 100* 89 91 95  BUN 19 13 15 17   CALCIUM 9.6 9.6 9.7 10.0  CREATININE 1.16 1.12 1.24 1.27*  GFRNONAA >60 70 62 60  GFRAA >60 81 72 70    LIVER FUNCTION TESTS: Recent Labs    03/01/20 0741 05/16/20 1008 08/29/20 1553 11/14/20 0958  BILITOT 0.4 0.4 0.4 0.4  AST 20 16 16 17   ALT 16 9 9 10   ALKPHOS 76  --   --   --   PROT 7.9 7.6 7.4 8.3*  ALBUMIN 4.3  --   --   --     TUMOR MARKERS: Recent Labs    05/16/20 1008 08/29/20 1553 11/14/20 0958  AFPTM 2.1 2.5 2.3    Assessment and Plan:  Doing well 9 months status post percutaneous  thermal ablation of 2 hepatocellular carcinoma lesions in the right hemiliver.  No evidence of residual or recurrent disease on surveillance MRI dated 11/27/2020.  Continue active imaging surveillance every 3 months.  1.)  Repeat MRI abdomen with gadolinium contrast and clinic visit in 3 months.   Electronically Signed: Criselda Peaches 12/05/2020, 10:59 AM   I spent a total of 10 Minutes in remote clinical consultation, greater than 50% of which was counseling/coordinating care for hepatocelluar carcinoma.    Visit type: Audio only (telephone). Audio (no video) only due to patient preference. Alternative for in-person consultation at [facility], [address] [place], [place], [place]. This visit type was conducted due to national recommendations for restrictions regarding the COVID-19 Pandemic (e.g. social distancing).  This format is felt to be most appropriate for this patient at this time.  All issues noted in this document were discussed and addressed.

## 2021-03-14 ENCOUNTER — Other Ambulatory Visit: Payer: Self-pay | Admitting: Nurse Practitioner

## 2021-03-14 DIAGNOSIS — C220 Liver cell carcinoma: Secondary | ICD-10-CM

## 2021-03-14 DIAGNOSIS — K7469 Other cirrhosis of liver: Secondary | ICD-10-CM

## 2021-04-03 ENCOUNTER — Ambulatory Visit
Admission: RE | Admit: 2021-04-03 | Discharge: 2021-04-03 | Disposition: A | Discharge status: Home / Self Care | Payer: Commercial Managed Care - PPO | Source: Ambulatory Visit | Attending: Nurse Practitioner | Admitting: Nurse Practitioner

## 2021-04-03 ENCOUNTER — Other Ambulatory Visit: Payer: Self-pay

## 2021-04-03 DIAGNOSIS — K7469 Other cirrhosis of liver: Secondary | ICD-10-CM

## 2021-04-03 DIAGNOSIS — C220 Liver cell carcinoma: Secondary | ICD-10-CM

## 2021-04-03 IMAGING — MR MR ABDOMEN WO/W CM
13 of 17 series · 33 of 48 positions shown · IV contrast (multihance)
Comparison: [DATE]

CLINICAL DATA: Follow-up hepatocellular carcinoma. Previous micro
ablation.

EXAM:
MRI ABDOMEN WITHOUT AND WITH CONTRAST
TECHNIQUE: Multiplanar multisequence MR imaging of the abdomen was performed
both before and after the administration of intravenous contrast.
CONTRAST:  12mL MULTIHANCE GADOBENATE DIMEGLUMINE 529 MG/ML IV SOLN

[Series 3: T2 · axial · 6.5mm · 0.74mm/px · 1 of 32 slices shown (1 of 3)]
[im 1/32]
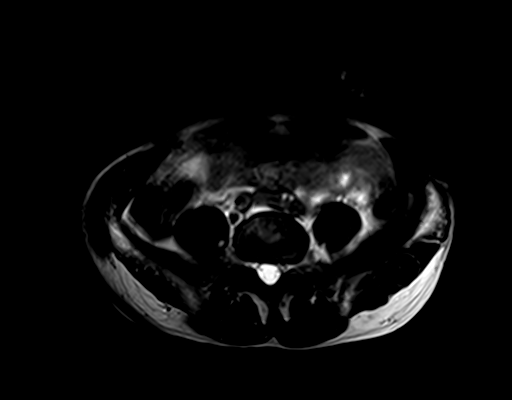

[Series 4: T2 · coronal · 5.0mm · 1.56mm/px · 1 of 31 slices shown (2 of 3)]
[im 1/31]
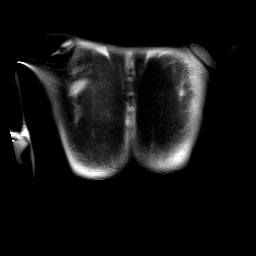

[Series 5: axial tru fisp · axial · 5.0mm · 1.41mm/px · 1 of 34 slices shown]
[im 1/34]
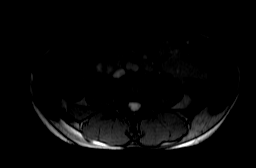

[Series 6: ep2d_diff_b50_500_800_p2 · axial · 6.0mm · 1.98mm/px · z∈[-180,+61]mm · 3 of 96 slices shown]
[im 1/96]
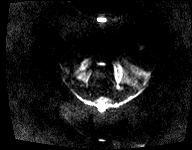
[im 48/96]
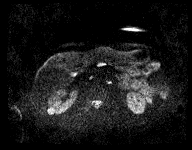
[im 96/96]
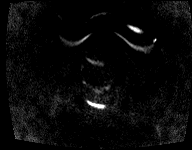

[Series 7: ep2d_diff_b50_500_800_p2_adc · axial · 6.0mm · 1.98mm/px · 1 of 32 slices shown]
[im 1/32]
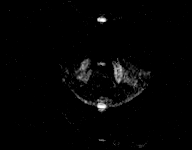

[Series 8: T2 · axial · 5.0mm · 1.37mm/px · 1 of 31 slices shown (3 of 3)]
[im 1/31]
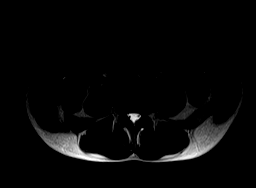

[Series 9: axial in out · axial · 6.0mm · 0.74mm/px · 1 of 60 slices shown]
[im 1/60]
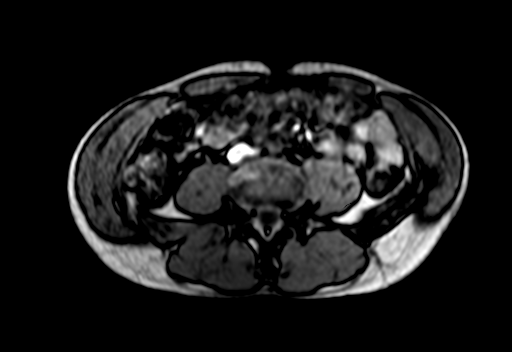

[Series 10: T1 dynamic · axial · non-contrast · 2.2mm · 0.78mm/px · z∈[-147,+44]mm · 4 of 88 slices shown]
[im 1/88]
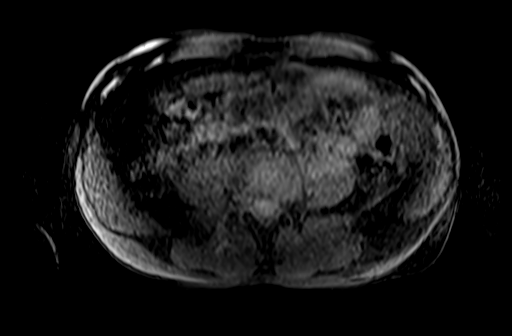
[im 30/88]
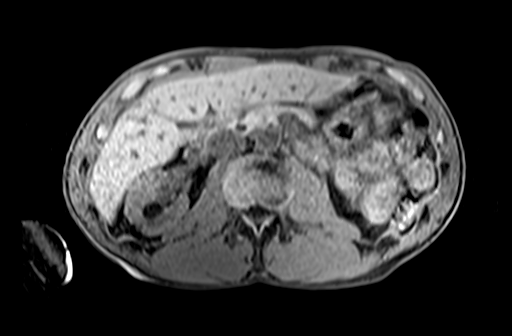
[im 59/88]
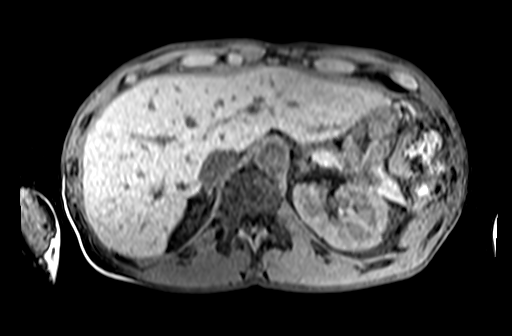
[im 88/88]
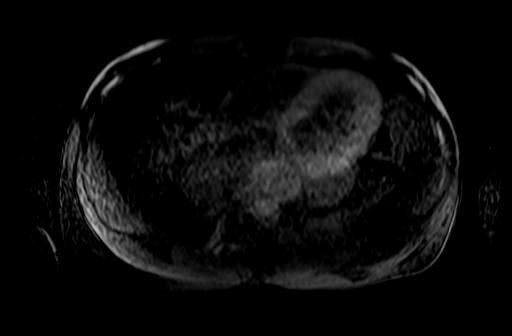

[Series 11: post 25 sec · axial · 2.2mm · 0.78mm/px · z∈[-147,+44]mm · 4 of 88 slices shown]
[im 1/88]
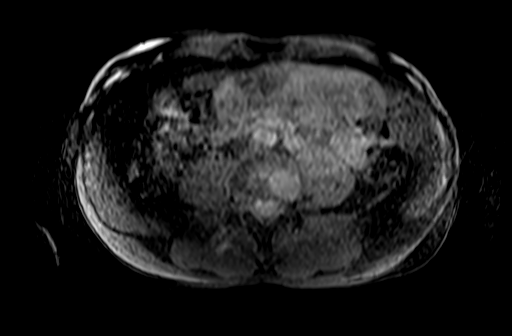
[im 30/88]
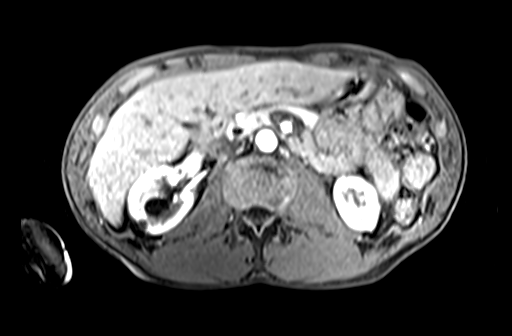
[im 59/88]
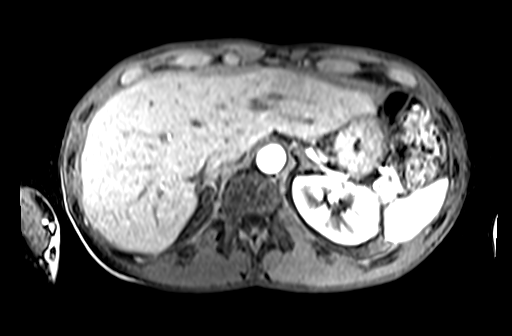
[im 88/88]
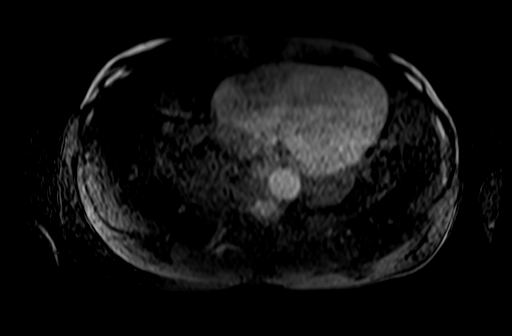

[Series 12: post 25 sec_sub · axial · 2.2mm · 0.78mm/px · z∈[-147,+44]mm · 4 of 88 slices shown]
[im 1/88]
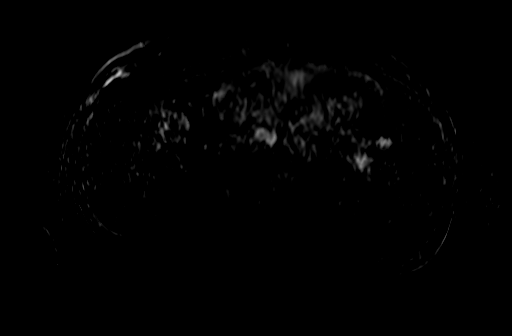
[im 30/88]
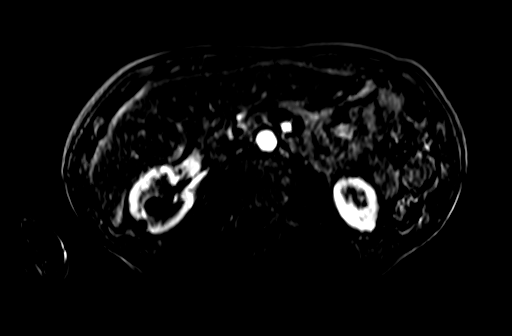
[im 59/88]
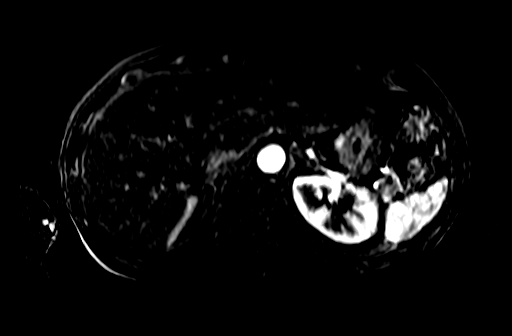
[im 88/88]
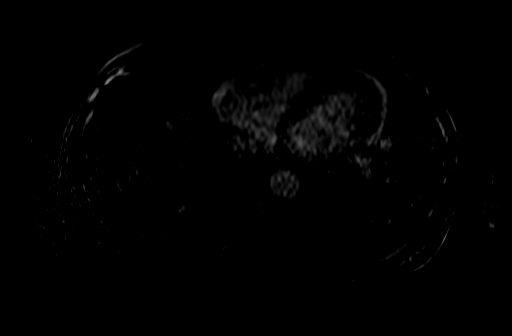

[Series 13: post 45 sec · axial · 2.2mm · 0.78mm/px · z∈[-147,+44]mm · 4 of 88 slices shown]
[im 1/88]
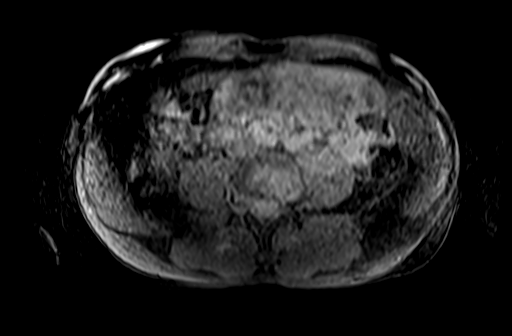
[im 30/88]
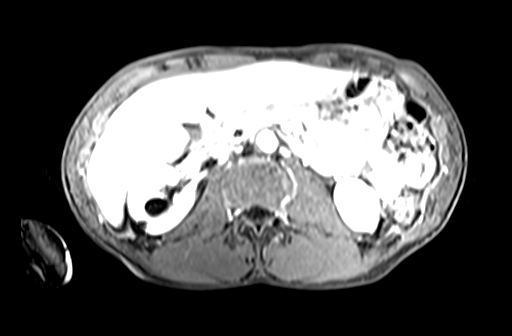
[im 59/88]
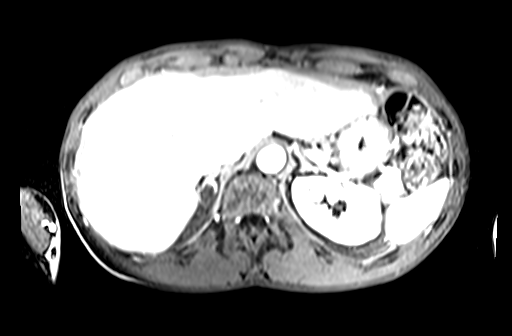
[im 88/88]
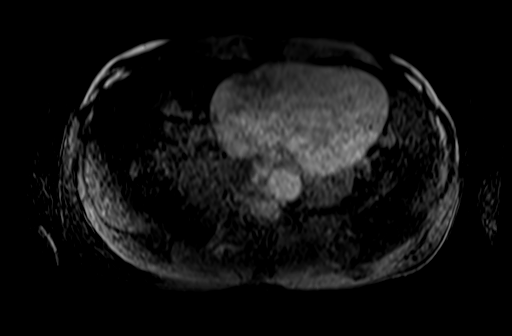

[Series 14: post 45 sec_sub · axial · 2.2mm · 0.78mm/px · z∈[-147,+44]mm · 4 of 88 slices shown]
[im 1/88]
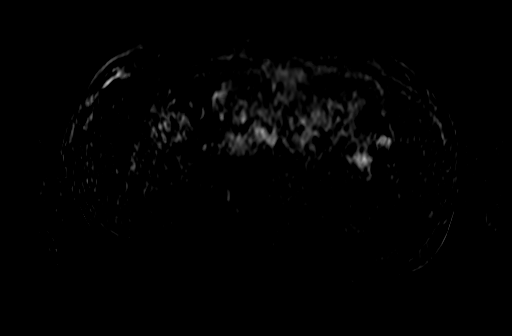
[im 30/88]
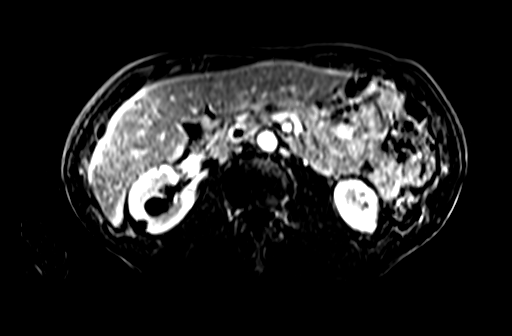
[im 59/88]
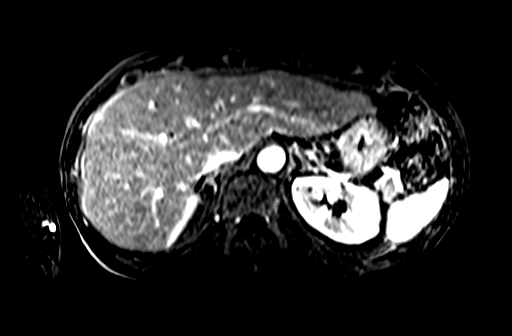
[im 88/88]
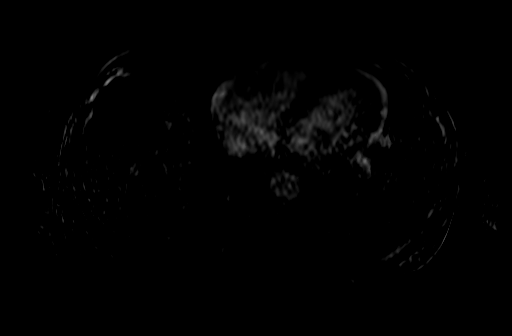

[Series 15: post 90 sec · axial · 2.2mm · 0.78mm/px · z∈[-147,+44]mm · 4 of 88 slices shown]
[im 1/88]
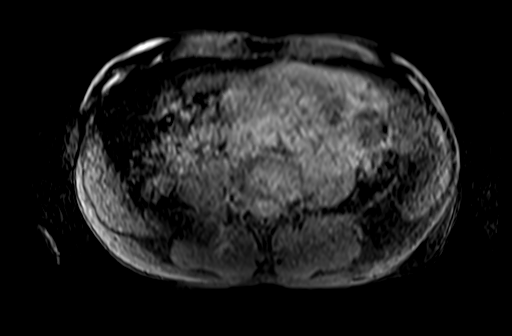
[im 30/88]
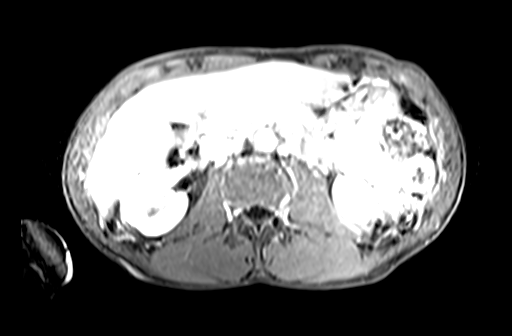
[im 59/88]
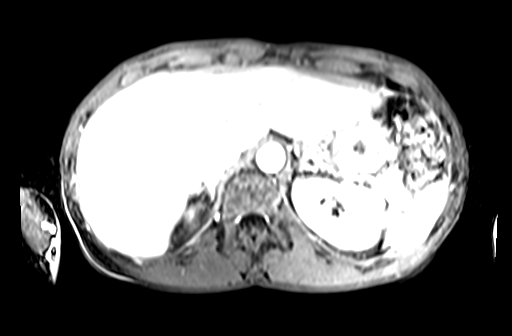
[im 88/88]
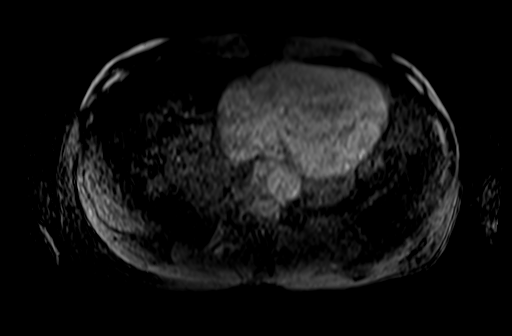

[33 of 48 positions shown; findings below may reference images not displayed]

FINDINGS: Lower chest: No acute findings.

Hepatobiliary: Small ablation defect in the lateral right hepatic
lobe remains stable in size and appearance. No evidence of recurrent
abnormal enhancing soft tissue density at the ablation site. Small
adjacent simple cyst in the right hepatic lobe is also stable.

A tiny 5 mm hypovascular lesion is now seen in segment 5 of the
right lobe on image 59/13 which was not seen on previous study. This
does not show arterial phase hyperenhancement, and is too small to
characterize. No other liver lesions identified. Gallbladder is
unremarkable. No evidence of biliary ductal dilatation.

Pancreas:  No mass or inflammatory changes.

Spleen:  Within normal limits in size and appearance.

Adrenals/Urinary Tract: Stable small benign bilateral Bosniak
category 1 and 2 renal cysts. No masses identified. No evidence of
hydronephrosis.

Stomach/Bowel: Visualized portion unremarkable.

Vascular/Lymphatic: No pathologically enlarged lymph nodes
identified. No acute vascular findings.

Other:  None.

Musculoskeletal:  No suspicious bone lesions identified.
IMPRESSION: Stable ablation defect in the lateral right hepatic lobe, without
evidence of recurrent tumor at this site.

5 mm hypovascular lesion in segment 5 of the right lobe was not
definitely seen on previous study, but is too small to characterize.
This shows no arterial phase hyperenhancement. Recommend continued
follow-up by MRI in 6 months.

No evidence of abdominal metastatic disease.

## 2021-04-03 MED ORDER — GADOBENATE DIMEGLUMINE 529 MG/ML IV SOLN
12.0000 mL | Freq: Once | INTRAVENOUS | Status: AC | PRN
  Administered 2021-04-03: 12 mL via INTRAVENOUS

## 2021-04-17 ENCOUNTER — Other Ambulatory Visit: Payer: Self-pay | Admitting: Interventional Radiology

## 2021-04-17 DIAGNOSIS — K769 Liver disease, unspecified: Secondary | ICD-10-CM

## 2021-05-09 ENCOUNTER — Other Ambulatory Visit: Payer: Self-pay

## 2021-05-09 ENCOUNTER — Ambulatory Visit
Admission: RE | Admit: 2021-05-09 | Discharge: 2021-05-09 | Disposition: A | Discharge status: Home / Self Care | Payer: Commercial Managed Care - PPO | Source: Ambulatory Visit | Attending: Interventional Radiology | Admitting: Interventional Radiology

## 2021-05-09 ENCOUNTER — Encounter: Payer: Self-pay | Admitting: *Deleted

## 2021-05-09 DIAGNOSIS — K769 Liver disease, unspecified: Secondary | ICD-10-CM

## 2021-05-09 HISTORY — PX: IR RADIOLOGIST EVAL & MGMT: IMG5224

## 2021-05-09 NOTE — Progress Notes (Signed)
Chief Complaint: Patient was consulted remotely today (TeleHealth) for hepatocellular cancer at the request of Mahmud Keithly K.    Referring Physician(s): Roosevelt Locks, NP  History of Present Illness: Corey Creque Sr. is a 65 y.o. male with a history of hepatitis C now status post 12-week treatment with Epclusa with a sustained virologic response and cure.  Prior to his treatment, he was confirmed to have F4 fibrosis.  Surveillance imaging demonstrated a small indeterminate solid lesion measuring 8 mm in October 2019.  Follow-up ultrasound in July 2020 demonstrated slight interval enlargement to 9 mm.  Dedicated MRI with Eovist contrast performed 12/08/2019 demonstrated 2 small lesions in the periphery of the right hepatic lobe which demonstrated imaging characteristics consistent with BI-RADS Category 5 definitive hepatocellular carcinoma.  The lesions measure 11 and 10 mm respectively.   He underwent successful percutaneous microwave ablation of the 2 lesions on 03/01/2020.  His postprocedural course was not complicated and he has recovered fully.  He is currently asymptomatic and doing well.  He denies abdominal pain, nausea, vomiting, decreased appetite, fever or chills.   Initial surveillance MRI dated 06/02/2020 demonstrates successful ablation of the 2 adjacent sites in hepatic segment 6 with no residual contrast enhancement.  A few scattered subcentimeter foci of arterial hyperenhancement are also noted along the anterior right hemiliver measuring no more than 6 mm consistent with BI-RADS Category 3, nonspecific lesions.     MRI 09/02/20 - no evidence of residual or recurrent disease at the prior microwave ablation site.  Areas of arterial hyperenhancement seen on a prior study not visible on today's study suggesting that they simply represented incidental areas of perfusion defect.  Subtle 7 mm focus of restricted diffusion in the most superior and central aspect of the  lateral segment of the left lobe warrants attention on follow-up imaging.   MRI 11/27/20 - No evidence of locally recurrent hepatocellular carcinoma or other suspicious liver lesions.  MRI 04/03/21 - Stable ablation defect in the lateral right hepatic lobe, without evidence of recurrent tumor at this site.  5 mm hypovascular lesion in segment 5 of the right lobe was not definitely seen on previous study, but is too small to characterize.  This shows no arterial phase hyperenhancement. Recommend continued follow-up by MRI in 6 months.  Past Medical History:  Diagnosis Date   Cancer ([place])    Hepatitis    HX OF hEP c ?    Murmur, heart 1963    Past Surgical History:  Procedure Laterality Date   IR RADIOLOGIST EVAL & MGMT  01/25/2020   IR RADIOLOGIST EVAL & MGMT  03/21/2020   IR RADIOLOGIST EVAL & MGMT  06/07/2020   IR RADIOLOGIST EVAL & MGMT  09/05/2020   IR RADIOLOGIST EVAL & MGMT  12/05/2020   IR RADIOLOGIST EVAL & MGMT  05/09/2021   RADIOLOGY WITH ANESTHESIA N/A 03/01/2020   Procedure: CT WITH ANESTHESIA  THERMAL MICROWAVE  ABLATIION;  Surgeon: Jacqulynn Cadet, MD;  Location: [facility];  Service: Anesthesiology;  Laterality: N/A;    Allergies: Patient has no known allergies.  Medications: Prior to Admission medications   Medication Sig Start Date End Date Taking? Authorizing Provider  cetirizine (ZYRTEC) 10 MG tablet Take 10 mg by mouth daily as needed for allergies.    [provider]  loratadine (CLARITIN) 10 MG tablet Take 10 mg by mouth daily as needed for allergies.    [provider]  oxyCODONE-acetaminophen (PERCOCET) 5-325 MG tablet Take 1 tablet by  mouth every 6 (six) hours as needed for moderate pain or severe pain. 03/02/20   Louk, Bea Graff, PA-C     Family History  Problem Relation Age of Onset   Arthritis Mother    Cancer Neg Hx    Diabetes Neg Hx    Drug abuse Neg Hx    Heart disease Neg Hx    Hyperlipidemia Neg Hx    Hypertension Neg Hx     Kidney disease Neg Hx    Stroke Neg Hx     Social History   Socioeconomic History   Marital status: Divorced    Spouse name: Not on file   Number of children: 2   Years of education: 12   Highest education level: Not on file  Occupational History   Not on file  Tobacco Use   Smoking status: Some Days    Pack years: 0.00    Types: Cigars   Smokeless tobacco: Never   Tobacco comments:    3 per week  Vaping Use   Vaping Use: Never used  Substance and Sexual Activity   Alcohol use: Not Currently   Drug use: No   Sexual activity: Not Currently  Other Topics Concern   Not on file  Social History Narrative   Not on file   Social Determinants of Health   Financial Resource Strain: Not on file  Food Insecurity: Not on file  Transportation Needs: Not on file  Physical Activity: Not on file  Stress: Not on file  Social Connections: Not on file    ECOG Status: 0 - Asymptomatic  Review of Systems  Review of Systems: A 12 point ROS discussed and pertinent positives are indicated in the HPI above.  All other systems are negative.  Physical Exam No direct physical exam was performed (except for noted visual exam findings with Video Visits).    Vital Signs: There were no vitals taken for this visit.  Imaging: IR Radiologist Eval & Mgmt  Result Date: 05/09/2021 Please refer to notes tab for details about interventional procedure. (Op Note)   Labs:  CBC: Recent Labs    05/16/20 1008 08/29/20 1553 11/14/20 0958  WBC 5.6 5.8 5.6  HGB 13.2 12.7* 14.2  HCT 39.1 38.1* 42.3  PLT 211 205 224    COAGS: Recent Labs    05/16/20 1008 08/29/20 1553 11/14/20 0958  INR 1.0 1.0 1.0    BMP: Recent Labs    05/16/20 1008 08/29/20 1553 11/14/20 0958  NA 141 140 140  K 4.8 4.2 4.6  CL 107 108 107  CO2 27 26 27   GLUCOSE 89 91 95  BUN 13 15 17   CALCIUM 9.6 9.7 10.0  CREATININE 1.12 1.24 1.27*  GFRNONAA 70 62 60  GFRAA 81 72 70    LIVER FUNCTION  TESTS: Recent Labs    05/16/20 1008 08/29/20 1553 11/14/20 0958  BILITOT 0.4 0.4 0.4  AST 16 16 17   ALT 9 9 10   PROT 7.6 7.4 8.3*    TUMOR MARKERS: Recent Labs    05/16/20 1008 08/29/20 1553 11/14/20 0958  AFPTM 2.1 2.5 2.3    Assessment and Plan:  Doing very well 14 months status post percutaneous thermal ablation of 2 right-sided hepatocellular carcinomas.  Patient is doing exceptionally well clinically and demonstrates no evidence of residual, recurrent or new disease on surveillance imaging.  We will continue active surveillance.  1.)  MRI with gadolinium contrast and follow-up clinic visit in 3 months  Electronically Signed: Criselda Peaches 05/09/2021, 4:27 PM   I spent a total of  10 Minutes in remote  clinical consultation, greater than 50% of which was counseling/coordinating care for hepatocellular cancer.    Visit type: Audio only (telephone). Audio (no video) only due to patient preference. Alternative for in-person consultation at [facility], [address] [place], [place], [place]. This visit type was conducted due to national recommendations for restrictions regarding the COVID-19 Pandemic (e.g. social distancing).  This format is felt to be most appropriate for this patient at this time.  All issues noted in this document were discussed and addressed.

## 2021-07-06 ENCOUNTER — Other Ambulatory Visit: Payer: Self-pay | Admitting: Interventional Radiology

## 2021-07-06 DIAGNOSIS — K769 Liver disease, unspecified: Secondary | ICD-10-CM

## 2021-09-03 ENCOUNTER — Ambulatory Visit (HOSPITAL_COMMUNITY)
Admission: RE | Admit: 2021-09-03 | Discharge: 2021-09-03 | Disposition: A | Discharge status: Home / Self Care | Payer: Commercial Managed Care - PPO | Source: Ambulatory Visit | Attending: Interventional Radiology | Admitting: Interventional Radiology

## 2021-09-03 DIAGNOSIS — K769 Liver disease, unspecified: Secondary | ICD-10-CM | POA: Diagnosis present

## 2021-09-03 IMAGING — MR MR ABDOMEN WO/W CM
19 of 20 series · 47 of 48 positions shown · IV contrast (gadavist)
Comparison: Multiple prior MRI examinations.

CLINICAL DATA: Follow-up hepatocellular carcinoma. History of RF
ablation.

EXAM:
MRI ABDOMEN WITHOUT AND WITH CONTRAST
TECHNIQUE: Multiplanar multisequence MR imaging of the abdomen was performed
both before and after the administration of intravenous contrast.
CONTRAST:  6mL GADAVIST GADOBUTROL 1 MMOL/ML IV SOLN

[Series 3: T2 · coronal · 6.0mm · 1.56mm/px · 2 of 30 slices shown (1 of 2)]
[im 1/30]
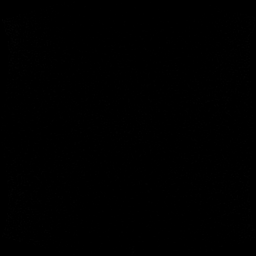
[im 30/30]
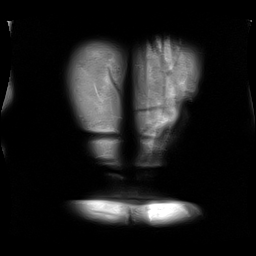

[Series 4: T2 fat-sat · axial · 6.0mm · 1.25mm/px · 1 of 35 slices shown]
[im 1/35]
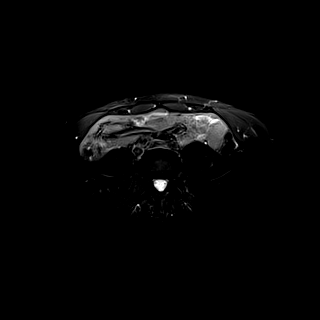

[Series 6: T1 · axial · 3.0mm · 1.25mm/px · z∈[-86,+175]mm · 3 of 88 slices shown (1 of 2)]
[im 1/88]
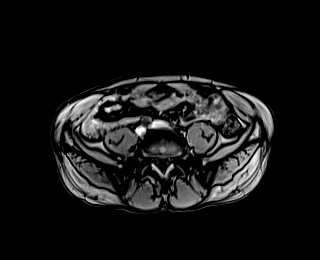
[im 44/88]
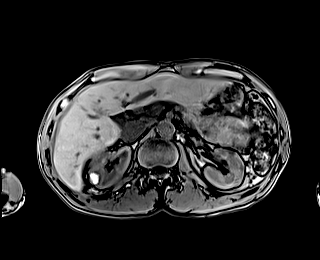
[im 88/88]
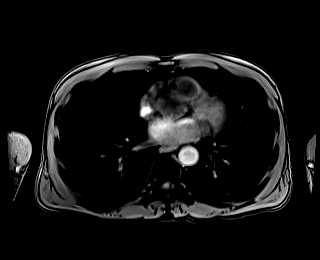

[Series 7: T1 · axial · 3.0mm · 1.25mm/px · z∈[-86,+175]mm · 3 of 88 slices shown (2 of 2)]
[im 1/88]
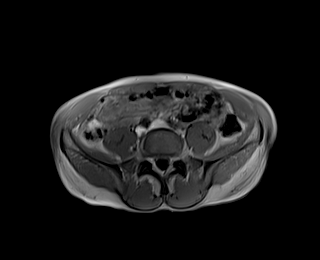
[im 44/88]
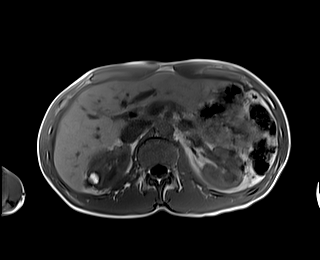
[im 88/88]
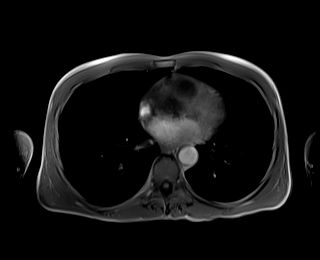

[Series 8: DWI · axial · 6.0mm · 1.49mm/px · z∈[-96,+171]mm · 3 of 75 slices shown (1 of 2)]
[im 1/75]
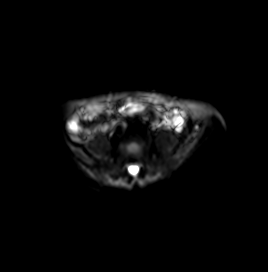
[im 38/75]
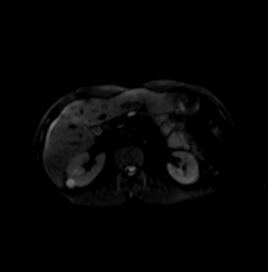
[im 75/75]
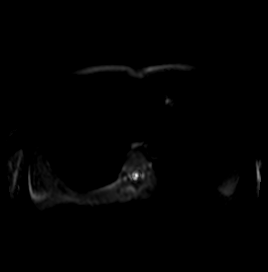

[Series 9: DWI · axial · 6.0mm · 1.49mm/px · 1 of 38 slices shown (2 of 2)]
[im 1/38]
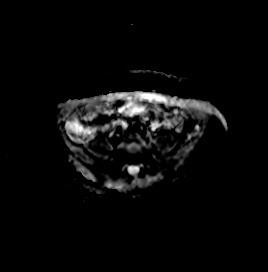

[Series 10: bSSFP · axial · 4.0mm · 0.84mm/px · z∈[-91,+165]mm · 2 of 65 slices shown]
[im 1/65]
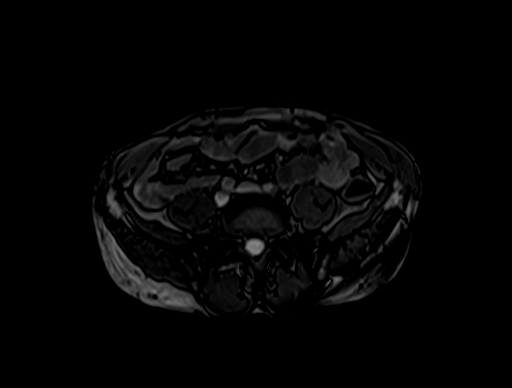
[im 65/65]
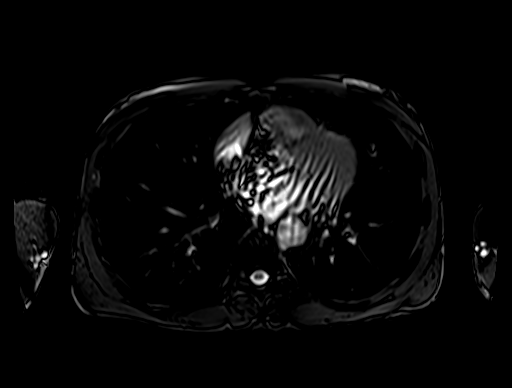

[Series 12: T1 dynamic · axial · 3.0mm · 1.25mm/px · z∈[-93,+168]mm · 3 of 88 slices shown (1 of 11)]
[im 1/88]
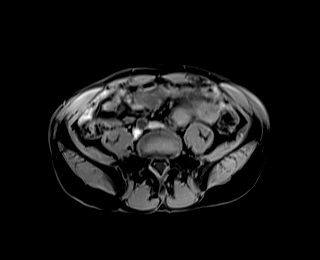
[im 44/88]
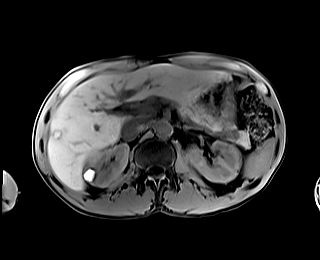
[im 88/88]
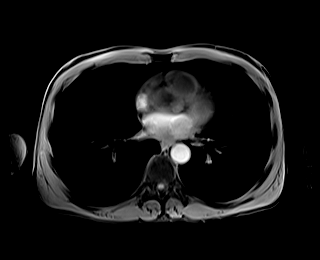

[Series 16: T1 dynamic · axial · 3.0mm · 1.25mm/px · z∈[-93,+168]mm · 3 of 86 slices shown (2 of 11)]
[im 1/86]
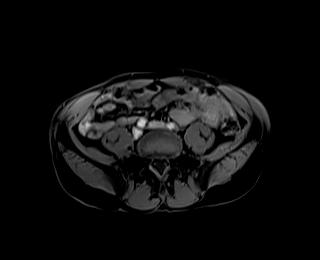
[im 43/86]
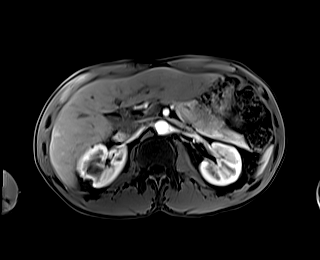
[im 86/86]
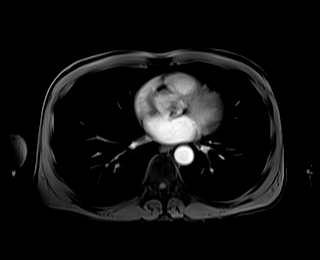

[Series 17: T1 dynamic · axial · 3.0mm · 1.25mm/px · z∈[-93,+168]mm · 3 of 88 slices shown (3 of 11)]
[im 1/88]
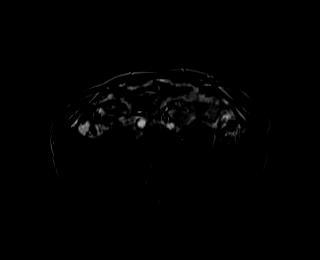
[im 44/88]
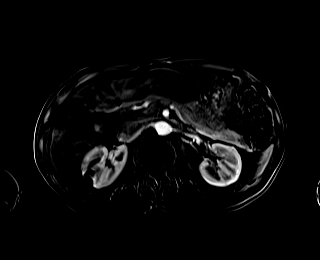
[im 88/88]
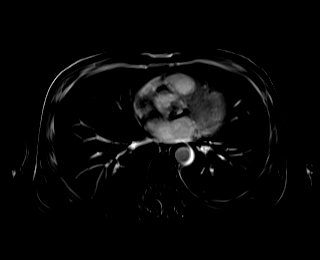

[Series 20: T1 dynamic · axial · 3.0mm · 1.25mm/px · z∈[-93,+168]mm · 3 of 88 slices shown (4 of 11)]
[im 1/88]
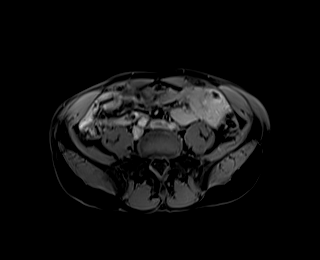
[im 44/88]
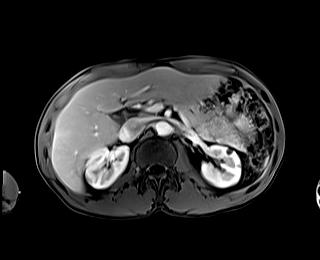
[im 88/88]
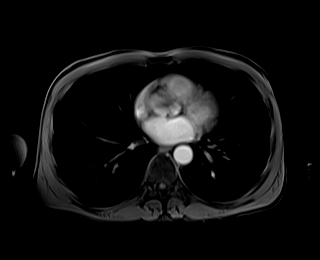

[Series 21: T1 dynamic · axial · 3.0mm · 1.25mm/px · z∈[-93,+168]mm · 3 of 88 slices shown (5 of 11)]
[im 1/88]
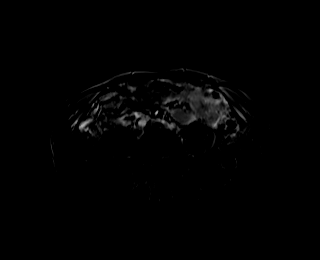
[im 44/88]
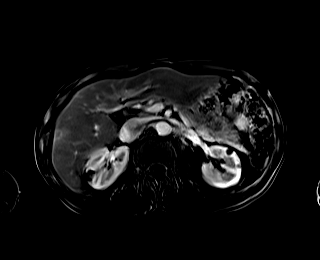
[im 88/88]
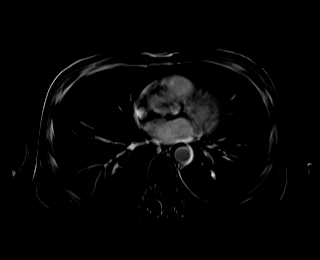

[Series 24: T1 dynamic · axial · 3.0mm · 1.25mm/px · z∈[-93,+168]mm · 3 of 88 slices shown (6 of 11)]
[im 1/88]
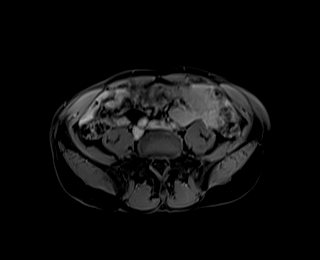
[im 44/88]
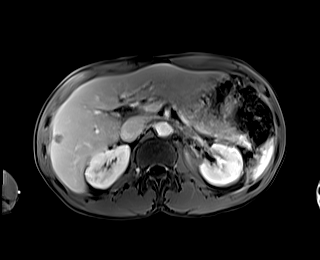
[im 88/88]
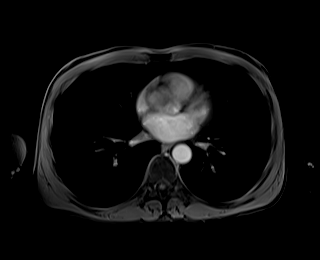

[Series 25: T1 dynamic · axial · 3.0mm · 1.25mm/px · z∈[-93,+168]mm · 3 of 88 slices shown (7 of 11)]
[im 1/88]
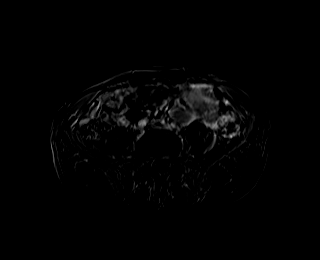
[im 44/88]
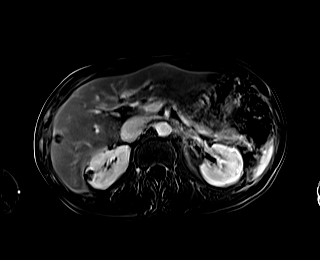
[im 88/88]
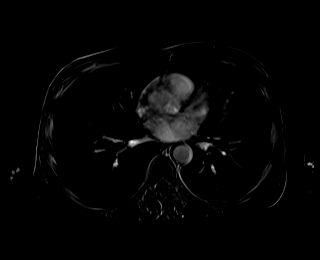

[Series 26: T1 dynamic · coronal · 3.0mm · 1.41mm/px · 2 of 72 slices shown (8 of 11)]
[im 1/72]
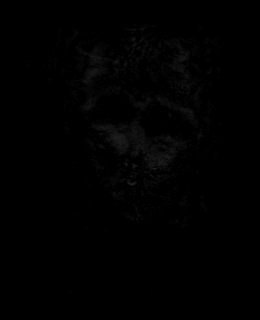
[im 72/72]
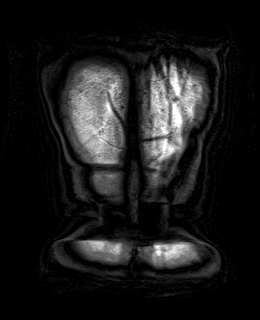

[Series 27: T1 dynamic · coronal · 3.0mm · 1.41mm/px · 2 of 72 slices shown (9 of 11)]
[im 1/72]
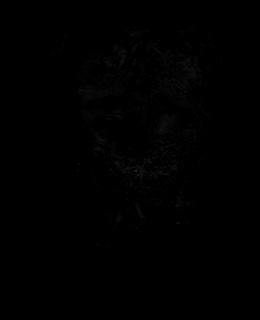
[im 72/72]
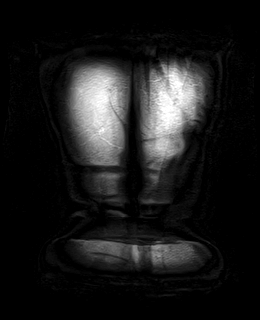

[Series 28: T2 · axial · 6.0mm · 1.56mm/px · 1 of 37 slices shown (2 of 2)]
[im 1/37]
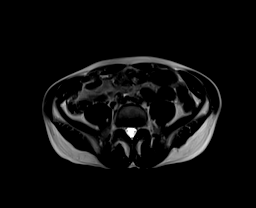

[Series 31: T1 dynamic · axial · 3.0mm · 1.25mm/px · z∈[-93,+168]mm · 3 of 87 slices shown (10 of 11)]
[im 1/87]
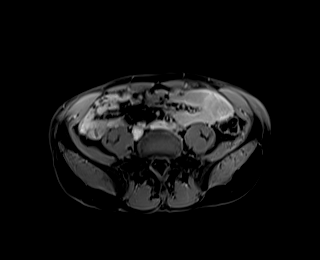
[im 44/87]
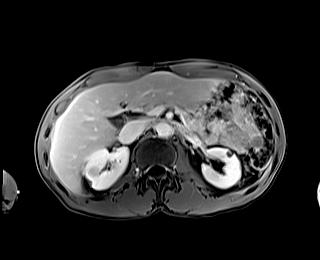
[im 87/87]
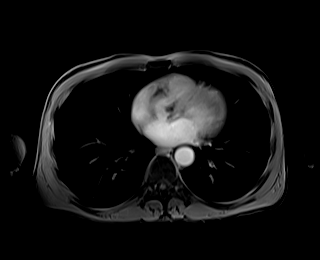

[Series 32: T1 dynamic · axial · 3.0mm · 1.25mm/px · z∈[-93,+168]mm · 3 of 88 slices shown (11 of 11)]
[im 1/88]
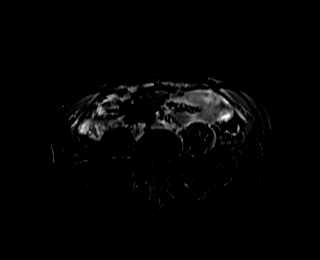
[im 44/88]
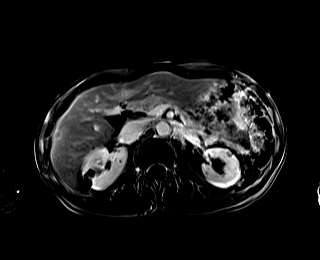
[im 88/88]
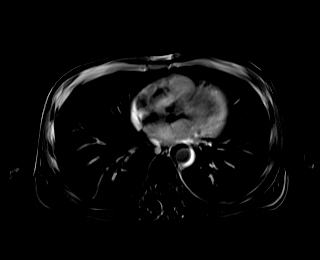

[47 of 48 positions shown; findings below may reference images not displayed]

FINDINGS: Lower chest: The lung bases are grossly clear. No pulmonary lesions
or pleural effusions.

Hepatobiliary: Side-by-side ablation sites are noted in the right
hepatic lobe in segment 6. The more anterior site remains largely
cystic in appearance and no findings for abnormal early arterial
phase enhancement.

The larger site which is more lateral and posterior demonstrates
persistent internal increased T1 signal intensity consistent with
hemorrhagic products. However, there is now a fairly thick rim of
contrast enhancement around the lesion along with a new nodular
focus of enhancement between the 2 lesions. Findings are worrisome
for recurrent tumor

There are several small foci of early arterial phase enhancement in
the liver. Most of these are peripheral and likely benign vascular
shunts. There are 2 adjacent more nodular foci in segment 8 on image
27 and 28/16. There is also a small focus posteriorly in segment 6
on image 33/16. Small focus is also noted near a right hepatic vein
in segment 7 medially on image 26/16.

Pancreas:  No mass, inflammation or ductal dilatation.

Spleen:  Normal size.  No focal lesions.

Adrenals/Urinary Tract:  The adrenal glands are unremarkable.

Stable bilateral renal cysts. Most of these are simple and some are
complicated by hemorrhage.

Stomach/Bowel: The stomach, duodenum, visualized small bowel and
visualized colon grossly normal.

Vascular/Lymphatic: The aorta and branch vessels are patent. No
mesenteric or retroperitoneal mass or adenopathy.

Other:  No ascites or abdominal wall hernia.

Musculoskeletal: No significant bony findings.
IMPRESSION: 1. Side-by-side ablation sites in the right hepatic lobe in segment
6. The more anterior site remains largely cystic in appearance and
no findings suspicious recurrent tumor in this area. There is
however abnormal contrast enhancement around the larger more
posterior and lateral ablation site worrisome for recurrent tumor.
2. Several small foci of early arterial phase enhancement in the
liver. The peripheral lesions are likely vascular shunts. The more
central lesions are likely dysplastic nodules. Recommend continued
surveillance.
3. Stable bilateral renal cysts.

## 2021-09-03 MED ORDER — GADOBUTROL 1 MMOL/ML IV SOLN
6.0000 mL | Freq: Once | INTRAVENOUS | Status: AC | PRN
  Administered 2021-09-03: 6 mL via INTRAVENOUS

## 2021-09-06 ENCOUNTER — Other Ambulatory Visit: Payer: Self-pay

## 2021-09-06 ENCOUNTER — Ambulatory Visit
Admission: RE | Admit: 2021-09-06 | Discharge: 2021-09-06 | Disposition: A | Discharge status: Home / Self Care | Payer: Commercial Managed Care - PPO | Source: Ambulatory Visit | Attending: Interventional Radiology | Admitting: Interventional Radiology

## 2021-09-06 DIAGNOSIS — K769 Liver disease, unspecified: Secondary | ICD-10-CM

## 2021-09-06 HISTORY — PX: IR RADIOLOGIST EVAL & MGMT: IMG5224

## 2021-09-06 NOTE — Progress Notes (Signed)
Chief Complaint: Patient was consulted remotely today (TeleHealth) for No chief complaint on file.  at the request of Muzammil Bruins K.    Referring Physician(s): Ira Busbin K  History of Present Illness: Corey Weant Sr. is a 65 y.o. male with a history of hepatitis C now status post 12-week treatment with Epclusa with a sustained virologic response and cure.  Prior to his treatment, he was confirmed to have F4 fibrosis.  Surveillance imaging demonstrated a small indeterminate solid lesion measuring 8 mm in October 2019.  Follow-up ultrasound in July 2020 demonstrated slight interval enlargement to 9 mm.  Dedicated MRI with Eovist contrast performed 12/08/2019 demonstrated 2 small lesions in the periphery of the right hepatic lobe which demonstrated imaging characteristics consistent with BI-RADS Category 5 definitive hepatocellular carcinoma.  The lesions measure 11 and 10 mm respectively.   He underwent successful percutaneous microwave ablation of the 2 lesions on 03/01/2020.  His postprocedural course was not complicated and he has recovered fully.  He is currently asymptomatic and doing well.  He denies abdominal pain, nausea, vomiting, decreased appetite, fever or chills.   Initial surveillance MRI dated 06/02/2020 demonstrates successful ablation of the 2 adjacent sites in hepatic segment 6 with no residual contrast enhancement.  A few scattered subcentimeter foci of arterial hyperenhancement are also noted along the anterior right hemiliver measuring no more than 6 mm consistent with BI-RADS Category 3, nonspecific lesions.     MRI 09/02/20 - no evidence of residual or recurrent disease at the prior microwave ablation site.  Areas of arterial hyperenhancement seen on a prior study not visible on today's study suggesting that they simply represented incidental areas of perfusion defect.  Subtle 7 mm focus of restricted diffusion in the most superior and central aspect of  the lateral segment of the left lobe warrants attention on follow-up imaging.   MRI 11/27/20 - No evidence of locally recurrent hepatocellular carcinoma or other suspicious liver lesions.   MRI 04/03/21 - Stable ablation defect in the lateral right hepatic lobe, without evidence of recurrent tumor at this site.  5 mm hypovascular lesion in segment 5 of the right lobe was not definitely seen on previous study, but is too small to characterize.  This shows no arterial phase hyperenhancement. Recommend continued follow-up by MRI in 6 months.  MRI 09/03/21 -  1. Side-by-side ablation sites in the right hepatic lobe in segment 6. The more anterior site remains largely cystic in appearance and no findings suspicious recurrent tumor in this area. There is however abnormal contrast enhancement around the larger more posterior and lateral ablation site worrisome for recurrent tumor.  2. Several small foci of early arterial phase enhancement in the liver. The peripheral lesions are likely vascular shunts. The more central lesions are likely dysplastic nodules. Recommend continued surveillance.  Past Medical History:  Diagnosis Date   Cancer ([place])    Hepatitis    HX OF hEP c ?    Murmur, heart 1963    Past Surgical History:  Procedure Laterality Date   IR RADIOLOGIST EVAL & MGMT  01/25/2020   IR RADIOLOGIST EVAL & MGMT  03/21/2020   IR RADIOLOGIST EVAL & MGMT  06/07/2020   IR RADIOLOGIST EVAL & MGMT  09/05/2020   IR RADIOLOGIST EVAL & MGMT  12/05/2020   IR RADIOLOGIST EVAL & MGMT  05/09/2021   RADIOLOGY WITH ANESTHESIA N/A 03/01/2020   Procedure: CT WITH ANESTHESIA  THERMAL MICROWAVE  ABLATIION;  Surgeon: Jacqulynn Cadet, MD;  Location: [facility];  Service: Anesthesiology;  Laterality: N/A;    Allergies: Patient has no known allergies.  Medications: Prior to Admission medications   Medication Sig Start Date End Date Taking? Authorizing Provider  cetirizine (ZYRTEC) 10 MG tablet Take 10 mg by mouth  daily as needed for allergies.    [provider]  loratadine (CLARITIN) 10 MG tablet Take 10 mg by mouth daily as needed for allergies.    [provider]  oxyCODONE-acetaminophen (PERCOCET) 5-325 MG tablet Take 1 tablet by mouth every 6 (six) hours as needed for moderate pain or severe pain. 03/02/20   Louk, Bea Graff, PA-C     Family History  Problem Relation Age of Onset   Arthritis Mother    Cancer Neg Hx    Diabetes Neg Hx    Drug abuse Neg Hx    Heart disease Neg Hx    Hyperlipidemia Neg Hx    Hypertension Neg Hx    Kidney disease Neg Hx    Stroke Neg Hx     Social History   Socioeconomic History   Marital status: Divorced    Spouse name: Not on file   Number of children: 2   Years of education: 12   Highest education level: Not on file  Occupational History   Not on file  Tobacco Use   Smoking status: Some Days    Types: Cigars   Smokeless tobacco: Never   Tobacco comments:    3 per week  Vaping Use   Vaping Use: Never used  Substance and Sexual Activity   Alcohol use: Not Currently   Drug use: No   Sexual activity: Not Currently  Other Topics Concern   Not on file  Social History Narrative   Not on file   Social Determinants of Health   Financial Resource Strain: Not on file  Food Insecurity: Not on file  Transportation Needs: Not on file  Physical Activity: Not on file  Stress: Not on file  Social Connections: Not on file    ECOG Status: 0 - Asymptomatic  Review of Systems  Review of Systems: A 12 point ROS discussed and pertinent positives are indicated in the HPI above.  All other systems are negative.  Physical Exam No direct physical exam was performed (except for noted visual exam findings with Video Visits).    Vital Signs: There were no vitals taken for this visit.  Imaging: MR ABDOMEN WWO CONTRAST  Result Date: 09/04/2021 CLINICAL DATA:  Follow-up hepatocellular carcinoma. History of RF ablation. EXAM: MRI  ABDOMEN WITHOUT AND WITH CONTRAST TECHNIQUE: Multiplanar multisequence MR imaging of the abdomen was performed both before and after the administration of intravenous contrast. CONTRAST:  61mL GADAVIST GADOBUTROL 1 MMOL/ML IV SOLN COMPARISON:  Multiple prior MRI examinations. FINDINGS: Lower chest: The lung bases are grossly clear. No pulmonary lesions or pleural effusions. Hepatobiliary: Side-by-side ablation sites are noted in the right hepatic lobe in segment 6. The more anterior site remains largely cystic in appearance and no findings for abnormal early arterial phase enhancement. The larger site which is more lateral and posterior demonstrates persistent internal increased T1 signal intensity consistent with hemorrhagic products. However, there is now a fairly thick rim of contrast enhancement around the lesion along with a new nodular focus of enhancement between the 2 lesions. Findings are worrisome for recurrent tumor There are several small foci of early arterial phase enhancement in the liver. Most of these are peripheral and likely benign vascular shunts.  There are 2 adjacent more nodular foci in segment 8 on image 27 and 28/16. There is also a small focus posteriorly in segment 6 on image 33/16. Small focus is also noted near a right hepatic vein in segment 7 medially on image 26/16. Pancreas:  No mass, inflammation or ductal dilatation. Spleen:  Normal size.  No focal lesions. Adrenals/Urinary Tract:  The adrenal glands are unremarkable. Stable bilateral renal cysts. Most of these are simple and some are complicated by hemorrhage. Stomach/Bowel: The stomach, duodenum, visualized small bowel and visualized colon grossly normal. Vascular/Lymphatic: The aorta and branch vessels are patent. No mesenteric or retroperitoneal mass or adenopathy. Other:  No ascites or abdominal wall hernia. Musculoskeletal: No significant bony findings. IMPRESSION: 1. Side-by-side ablation sites in the right hepatic lobe in  segment 6. The more anterior site remains largely cystic in appearance and no findings suspicious recurrent tumor in this area. There is however abnormal contrast enhancement around the larger more posterior and lateral ablation site worrisome for recurrent tumor. 2. Several small foci of early arterial phase enhancement in the liver. The peripheral lesions are likely vascular shunts. The more central lesions are likely dysplastic nodules. Recommend continued surveillance. 3. Stable bilateral renal cysts. Electronically Signed   By: Marijo Sanes M.D.   On: 09/04/2021 14:57    Labs:  CBC: Recent Labs    11/14/20 0958  WBC 5.6  HGB 14.2  HCT 42.3  PLT 224    COAGS: Recent Labs    11/14/20 0958  INR 1.0    BMP: Recent Labs    11/14/20 0958  NA 140  K 4.6  CL 107  CO2 27  GLUCOSE 95  BUN 17  CALCIUM 10.0  CREATININE 1.27*  GFRNONAA 60  GFRAA 70    LIVER FUNCTION TESTS: Recent Labs    11/14/20 0958  BILITOT 0.4  AST 17  ALT 10  PROT 8.3*    TUMOR MARKERS: Recent Labs    11/14/20 0958  AFPTM 2.3    Assessment and Plan:  Pleasant 65 year old gentleman with a history of hepatocellular carcinoma status post percutaneous microwave ablation in April 2021.  He has done well over the past 1.5 years, however his most recent MRI demonstrates some concerning nodular enhancement around the periphery of the larger ablation site.  Additionally, there are multiple tiny subcentimeter enhancing foci which may represent perfusion anomalies versus dysplastic nodules, or developing multifocal hepatocellular carcinoma.  His AFP has historically been normal suggesting that his tumor is nonsecreting.  However, I believe it would be worthwhile to reassess his AFP at this time.  Additionally, he requires continued short term surveillance imaging to assess if he is a developing recurrent disease, and to what extent.  He may require future treatments including microwave ablation or  transarterial radioembolization depending on the multifocality of disease.  1.) Liver labs to be obtained now.    2.) Repeat MRI of the abdomen with gadolinium contrast, liver labs and clinic visit in 3 months time (January 2023).    Electronically Signed: Criselda Peaches 09/06/2021, 1:46 PM   I spent a total of 15 Minutes in remote  clinical consultation, greater than 50% of which was counseling/coordinating care for hepatocellular cancer.    Visit type: Audio only (telephone). Audio (no video) only due to patient preference. Alternative for in-person consultation at [facility], [address] [place], [place], [place]. This visit type was conducted due to national recommendations for restrictions regarding the COVID-19 Pandemic (e.g. social  distancing).  This format is felt to be most appropriate for this patient at this time.  All issues noted in this document were discussed and addressed.

## 2021-09-07 ENCOUNTER — Other Ambulatory Visit: Payer: Self-pay | Admitting: *Deleted

## 2021-09-07 ENCOUNTER — Encounter: Payer: Self-pay | Admitting: *Deleted

## 2021-09-07 DIAGNOSIS — K769 Liver disease, unspecified: Secondary | ICD-10-CM

## 2021-09-20 LAB — COMPLETE METABOLIC PANEL WITH GFR
AG Ratio: 1.5 (calc) (ref 1.0–2.5)
ALT: 8 U/L — ABNORMAL LOW (ref 9–46)
AST: 16 U/L (ref 10–35)
Albumin: 4.5 g/dL (ref 3.6–5.1)
Alkaline phosphatase (APISO): 91 U/L (ref 35–144)
BUN: 12 mg/dL (ref 7–25)
CO2: 28 mmol/L (ref 20–32)
Calcium: 9.8 mg/dL (ref 8.6–10.3)
Chloride: 105 mmol/L (ref 98–110)
Creat: 1.07 mg/dL (ref 0.70–1.35)
Globulin: 3.1 g/dL (calc) (ref 1.9–3.7)
Glucose, Bld: 77 mg/dL (ref 65–99)
Potassium: 4.1 mmol/L (ref 3.5–5.3)
Sodium: 139 mmol/L (ref 135–146)
Total Bilirubin: 0.4 mg/dL (ref 0.2–1.2)
Total Protein: 7.6 g/dL (ref 6.1–8.1)
eGFR: 78 mL/min/{1.73_m2} (ref >=60)

## 2021-09-20 LAB — CBC
HCT: 40 % (ref 38.5–50.0)
Hemoglobin: 13.4 g/dL (ref 13.2–17.1)
MCH: 31.7 pg (ref 27.0–33.0)
MCHC: 33.5 g/dL (ref 32.0–36.0)
MCV: 94.6 fL (ref 80.0–100.0)
MPV: 11.4 fL (ref 7.5–12.5)
Platelets: 175 10*3/uL (ref 140–400)
RBC: 4.23 10*6/uL (ref 4.20–5.80)
RDW: 12.3 % (ref 11.0–15.0)
WBC: 5.2 10*3/uL (ref 3.8–10.8)

## 2021-09-20 LAB — AFP TUMOR MARKER: AFP-Tumor Marker: 2.5 ng/mL (ref <6.1)

## 2021-09-20 LAB — HEPATIC FUNCTION PANEL
AG Ratio: 1.5 (calc) (ref 1.0–2.5)
ALT: 8 U/L — ABNORMAL LOW (ref 9–46)
AST: 16 U/L (ref 10–35)
Albumin: 4.5 g/dL (ref 3.6–5.1)
Alkaline phosphatase (APISO): 91 U/L (ref 35–144)
Bilirubin, Direct: 0.1 mg/dL (ref 0.0–0.2)
Globulin: 3.1 g/dL (calc) (ref 1.9–3.7)
Indirect Bilirubin: 0.3 mg/dL (calc) (ref 0.2–1.2)
Total Bilirubin: 0.4 mg/dL (ref 0.2–1.2)
Total Protein: 7.6 g/dL (ref 6.1–8.1)

## 2021-09-20 LAB — PROTIME-INR
INR: 1.1
Prothrombin Time: 11.1 s (ref 9.0–11.5)

## 2021-11-21 ENCOUNTER — Other Ambulatory Visit: Payer: Self-pay | Admitting: Interventional Radiology

## 2021-11-21 DIAGNOSIS — K769 Liver disease, unspecified: Secondary | ICD-10-CM

## 2021-12-07 ENCOUNTER — Ambulatory Visit (HOSPITAL_COMMUNITY)
Admission: RE | Admit: 2021-12-07 | Discharge: 2021-12-07 | Disposition: A | Discharge status: Home / Self Care | Payer: Commercial Managed Care - PPO | Source: Ambulatory Visit | Attending: Interventional Radiology | Admitting: Interventional Radiology

## 2021-12-07 ENCOUNTER — Other Ambulatory Visit: Payer: Self-pay

## 2021-12-07 DIAGNOSIS — K769 Liver disease, unspecified: Secondary | ICD-10-CM | POA: Insufficient documentation

## 2021-12-07 MED ORDER — GADOBUTROL 1 MMOL/ML IV SOLN
6.0000 mL | Freq: Once | INTRAVENOUS | Status: AC | PRN
  Administered 2021-12-07: 6 mL via INTRAVENOUS

## 2021-12-12 ENCOUNTER — Encounter: Payer: Self-pay | Admitting: *Deleted

## 2021-12-12 ENCOUNTER — Ambulatory Visit
Admission: RE | Admit: 2021-12-12 | Discharge: 2021-12-12 | Disposition: A | Discharge status: Home / Self Care | Payer: Commercial Managed Care - PPO | Source: Ambulatory Visit | Attending: Interventional Radiology | Admitting: Interventional Radiology

## 2021-12-12 ENCOUNTER — Other Ambulatory Visit: Payer: Self-pay

## 2021-12-12 DIAGNOSIS — K769 Liver disease, unspecified: Secondary | ICD-10-CM

## 2021-12-12 HISTORY — PX: IR RADIOLOGIST EVAL & MGMT: IMG5224

## 2021-12-12 NOTE — Progress Notes (Signed)
Chief Complaint: Patient was consulted remotely today (TeleHealth) for hepatocellular cancer at the request of Naelle Diegel K.    Referring Physician(s): Roosevelt Locks, NP  History of Present Illness: Corey Lynch Sr. is a 66 y.o. male male with a history of hepatitis C now status post 12-week treatment with Epclusa with a sustained virologic response and cure.  Prior to his treatment, he was confirmed to have F4 fibrosis.  Surveillance imaging demonstrated a small indeterminate solid lesion measuring 8 mm in October 2019.  Follow-up ultrasound in July 2020 demonstrated slight interval enlargement to 9 mm.  Dedicated MRI with Eovist contrast performed 12/08/2019 demonstrated 2 small lesions in the periphery of the right hepatic lobe which demonstrated imaging characteristics consistent with LI-RADS Category 5 definitive hepatocellular carcinoma.  The lesions measure 11 and 10 mm respectively.   He underwent successful percutaneous microwave ablation of the 2 lesions on 03/01/2020.  His postprocedural course was not complicated and he has recovered fully.  He is currently asymptomatic and doing well.  He denies abdominal pain, nausea, vomiting, decreased appetite, fever or chills. He is working full time and doing "excellent".    Initial surveillance MRI dated 06/02/2020 demonstrates successful ablation of the 2 adjacent sites in hepatic segment 6 with no residual contrast enhancement.  A few scattered subcentimeter foci of arterial hyperenhancement are also noted along the anterior right hemiliver measuring no more than 6 mm consistent with BI-RADS Category 3, nonspecific lesions.     MRI 09/02/20 - no evidence of residual or recurrent disease at the prior microwave ablation site.  Areas of arterial hyperenhancement seen on a prior study not visible on today's study suggesting that they simply represented incidental areas of perfusion defect.  Subtle 7 mm focus of restricted diffusion  in the most superior and central aspect of the lateral segment of the left lobe warrants attention on follow-up imaging.   MRI 11/27/20 - No evidence of locally recurrent hepatocellular carcinoma or other suspicious liver lesions.   MRI 04/03/21 - Stable ablation defect in the lateral right hepatic lobe, without evidence of recurrent tumor at this site.  5 mm hypovascular lesion in segment 5 of the right lobe was not definitely seen on previous study, but is too small to characterize.  This shows no arterial phase hyperenhancement. Recommend continued follow-up by MRI in 6 months.   MRI 09/03/21 -  1. Side-by-side ablation sites in the right hepatic lobe in segment 6. The more anterior site remains largely cystic in appearance and no findings suspicious recurrent tumor in this area. There is however abnormal contrast enhancement around the larger more posterior and lateral ablation site worrisome for recurrent tumor.  2. Several small foci of early arterial phase enhancement in the liver. The peripheral lesions are likely vascular shunts. The more central lesions are likely dysplastic nodules. Recommend continued surveillance.  MRI 12/08/21 -  1. Ablation defects in the right lobe of the liver, similar to the prior study, without definitive evidence to suggest local recurrence of disease or metastatic disease. 2. Multiple tiny arterial phase areas of nodular hyperenhancement scattered throughout the liver, predominantly in the right lobe of the liver, without perceptible signal abnormality or diffusion restriction on other pulse sequences, nonspecific, and likely small benign areas of vascular shunting. Continued attention on follow-up imaging is recommended.  Past Medical History:  Diagnosis Date   Cancer ([place])    Hepatitis    HX OF hEP c ?    Murmur, heart 1963  Past Surgical History:  Procedure Laterality Date   IR RADIOLOGIST EVAL & MGMT  01/25/2020   IR RADIOLOGIST EVAL & MGMT   03/21/2020   IR RADIOLOGIST EVAL & MGMT  06/07/2020   IR RADIOLOGIST EVAL & MGMT  09/05/2020   IR RADIOLOGIST EVAL & MGMT  12/05/2020   IR RADIOLOGIST EVAL & MGMT  05/09/2021   IR RADIOLOGIST EVAL & MGMT  09/06/2021   IR RADIOLOGIST EVAL & MGMT  12/12/2021   RADIOLOGY WITH ANESTHESIA N/A 03/01/2020   Procedure: CT WITH ANESTHESIA  THERMAL MICROWAVE  ABLATIION;  Surgeon: Jacqulynn Cadet, MD;  Location: [facility];  Service: Anesthesiology;  Laterality: N/A;    Allergies: Patient has no known allergies.  Medications: Prior to Admission medications   Medication Sig Start Date End Date Taking? Authorizing Provider  cetirizine (ZYRTEC) 10 MG tablet Take 10 mg by mouth daily as needed for allergies.    [provider]  loratadine (CLARITIN) 10 MG tablet Take 10 mg by mouth daily as needed for allergies.    [provider]  oxyCODONE-acetaminophen (PERCOCET) 5-325 MG tablet Take 1 tablet by mouth every 6 (six) hours as needed for moderate pain or severe pain. 03/02/20   Louk, Bea Graff, PA-C     Family History  Problem Relation Age of Onset   Arthritis Mother    Cancer Neg Hx    Diabetes Neg Hx    Drug abuse Neg Hx    Heart disease Neg Hx    Hyperlipidemia Neg Hx    Hypertension Neg Hx    Kidney disease Neg Hx    Stroke Neg Hx     Social History   Socioeconomic History   Marital status: Divorced    Spouse name: Not on file   Number of children: 2   Years of education: 12   Highest education level: Not on file  Occupational History   Not on file  Tobacco Use   Smoking status: Some Days    Types: Cigars   Smokeless tobacco: Never   Tobacco comments:    3 per week  Vaping Use   Vaping Use: Never used  Substance and Sexual Activity   Alcohol use: Not Currently   Drug use: No   Sexual activity: Not Currently  Other Topics Concern   Not on file  Social History Narrative   Not on file   Social Determinants of Health   Financial Resource Strain: Not on file   Food Insecurity: Not on file  Transportation Needs: Not on file  Physical Activity: Not on file  Stress: Not on file  Social Connections: Not on file    ECOG Status: 0 - Asymptomatic  Review of Systems  Review of Systems: A 12 point ROS discussed and pertinent positives are indicated in the HPI above.  All other systems are negative.  Physical Exam No direct physical exam was performed (except for noted visual exam findings with Video Visits).    Vital Signs: There were no vitals taken for this visit.  Imaging: MR ABDOMEN WWO CONTRAST  Result Date: 12/08/2021 CLINICAL DATA:  66 year old male with history of hepatocellular carcinoma status post radiofrequency ablation. Follow-up study. EXAM: MRI ABDOMEN WITHOUT AND WITH CONTRAST TECHNIQUE: Multiplanar multisequence MR imaging of the abdomen was performed both before and after the administration of intravenous contrast. CONTRAST:  79mL GADAVIST GADOBUTROL 1 MMOL/ML IV SOLN COMPARISON:  Multiple priors, most recently 09/03/2021. FINDINGS: Lower chest: Unremarkable. Hepatobiliary: In the right lobe of the liver  there are again 2 adjacent small lesions at the site of prior radiofrequency ablation. The smallest of these (axial image 41 of series 17) measures 7 mm and is T1 hypointense, T2 hyperintense and does not enhance. The larger lesion (axial image 38 of series 17) is heterogeneous in signal intensity on T1 and T2 weighted images, but demonstrates no compelling internal enhancement on post gadolinium images. Throughout much of the right lobe of the liver there are increasing small nodular foci of hyperenhancement during arterial phase imaging which is not confidently identified on more delayed post gadolinium sequences. These are not associated with areas of definitive diffusion restriction. No clearly identifiable aggressive appearing hepatic lesions are noted on today's examination. No intra or extrahepatic biliary ductal dilatation.  Gallbladder is normal in appearance. Pancreas: No pancreatic mass. No pancreatic ductal dilatation. No pancreatic or peripancreatic fluid collections or inflammatory changes. Spleen:  Unremarkable. Adrenals/Urinary Tract: There are multiple T1 hypointense, T2 hyperintense, nonenhancing lesions in the kidneys, compatible with simple cysts, largest of which is in the upper pole of the right kidney measuring 2.0 cm in diameter (axial image 22 of series 5). Other T1 hyperintense lesions which demonstrate variable T2 intensity, but no internal enhancement are compatible with proteinaceous/hemorrhagic cysts, largest of which measures 1.3 cm in the posterolateral aspect of the upper pole of the right kidney (axial image 45 of series 17). No definite aggressive appearing renal lesions are noted. No hydroureteronephrosis in the visualized portions of the abdomen. Bilateral adrenal glands are normal in appearance. Stomach/Bowel: Visualized portions are unremarkable. Vascular/Lymphatic: Aortic atherosclerosis. No aneurysm identified in the visualized abdominal vasculature. No lymphadenopathy noted in the abdomen. Other:  No significant volume of ascites. Musculoskeletal: No aggressive appearing osseous lesions are noted in the visualized portions of the skeleton. IMPRESSION: 1. Ablation defects in the right lobe of the liver, similar to the prior study, without definitive evidence to suggest local recurrence of disease or metastatic disease. 2. Multiple tiny arterial phase areas of nodular hyperenhancement scattered throughout the liver, predominantly in the right lobe of the liver, without perceptible signal abnormality or diffusion restriction on other pulse sequences, nonspecific, and likely small benign areas of vascular shunting. Continued attention on follow-up imaging is recommended. 3. Multiple Bosniak class 1 and Bosniak class 2 cysts in the kidneys bilaterally, similar to the prior study, as above. 4. Aortic  atherosclerosis. Electronically Signed   By: Vinnie Langton M.D.   On: 12/08/2021 12:54   IR Radiologist Eval & Mgmt  Result Date: 12/12/2021 Please refer to notes tab for details about interventional procedure. (Op Note)   Labs:  CBC: Recent Labs    09/19/21 1412  WBC 5.2  HGB 13.4  HCT 40.0  PLT 175    COAGS: Recent Labs    09/19/21 1412  INR 1.1    BMP: Recent Labs    09/19/21 1412  NA 139  K 4.1  CL 105  CO2 28  GLUCOSE 77  BUN 12  CALCIUM 9.8  CREATININE 1.07    LIVER FUNCTION TESTS: Recent Labs    09/19/21 1412  BILITOT 0.4   0.4  AST 16   16  ALT 8*   8*  PROT 7.6   7.6    TUMOR MARKERS: Recent Labs    09/19/21 1412  AFPTM 2.5    Assessment and Plan:  Doing very well 21 months status post percutaneous thermal ablation of 2 right-sided hepatocellular carcinomas.  Patient is doing exceptionally well clinically and demonstrates no  evidence of residual, recurrent or new disease on surveillance imaging.  We will continue active surveillance.   1.)  Two-year f/u MRI with gadolinium contrast, serum AFP and clinic visit in late April 2023.  If surveillance I aging still negative for new disease, we can shift to every 6 months.    Electronically Signed: Antonietta Jewel [facility] 12/12/2021, 10:20 AM   I spent a total of   15 Minutes in remote  clinical consultation, greater than 50% of which was counseling/coordinating care for hepatocellular cancer.    Visit type: Audio only (telephone). Audio (no video) only due to patient preference. Alternative for in-person consultation at [facility], [address] [place], [place], [place]. This visit type was conducted due to national recommendations for restrictions regarding the COVID-19 Pandemic (e.g. social distancing).  This format is felt to be most appropriate for this patient at this time.  All issues noted in this document were discussed and addressed.

## 2022-01-28 ENCOUNTER — Other Ambulatory Visit: Payer: Self-pay | Admitting: Interventional Radiology

## 2022-01-28 ENCOUNTER — Other Ambulatory Visit: Payer: Self-pay

## 2022-01-28 ENCOUNTER — Other Ambulatory Visit: Payer: Self-pay | Admitting: Nurse Practitioner

## 2022-01-28 DIAGNOSIS — K769 Liver disease, unspecified: Secondary | ICD-10-CM

## 2022-02-04 ENCOUNTER — Other Ambulatory Visit: Payer: Self-pay

## 2022-02-04 ENCOUNTER — Other Ambulatory Visit: Payer: Self-pay | Admitting: Nurse Practitioner

## 2022-02-04 DIAGNOSIS — C220 Liver cell carcinoma: Secondary | ICD-10-CM

## 2022-02-04 DIAGNOSIS — K769 Liver disease, unspecified: Secondary | ICD-10-CM

## 2022-04-22 ENCOUNTER — Other Ambulatory Visit: Payer: Self-pay | Admitting: Nurse Practitioner

## 2022-04-22 DIAGNOSIS — K7469 Other cirrhosis of liver: Secondary | ICD-10-CM

## 2022-04-22 DIAGNOSIS — C220 Liver cell carcinoma: Secondary | ICD-10-CM

## 2022-06-01 ENCOUNTER — Ambulatory Visit
Admission: RE | Admit: 2022-06-01 | Discharge: 2022-06-01 | Disposition: A | Discharge status: Home / Self Care | Payer: Commercial Managed Care - PPO | Source: Ambulatory Visit | Attending: Nurse Practitioner | Admitting: Nurse Practitioner

## 2022-06-01 DIAGNOSIS — C220 Liver cell carcinoma: Secondary | ICD-10-CM

## 2022-06-01 DIAGNOSIS — K7469 Other cirrhosis of liver: Secondary | ICD-10-CM

## 2022-06-01 MED ORDER — GADOBENATE DIMEGLUMINE 529 MG/ML IV SOLN
12.0000 mL | Freq: Once | INTRAVENOUS | Status: AC | PRN
  Administered 2022-06-01: 12 mL via INTRAVENOUS

## 2022-06-04 ENCOUNTER — Other Ambulatory Visit: Payer: Self-pay | Admitting: Interventional Radiology

## 2022-06-04 ENCOUNTER — Ambulatory Visit: Admission: RE | Admit: 2022-06-04 | Payer: Commercial Managed Care - PPO | Source: Ambulatory Visit

## 2022-06-04 DIAGNOSIS — M519 Unspecified thoracic, thoracolumbar and lumbosacral intervertebral disc disorder: Secondary | ICD-10-CM

## 2022-06-18 ENCOUNTER — Ambulatory Visit (HOSPITAL_COMMUNITY)
Admission: RE | Admit: 2022-06-18 | Discharge: 2022-06-18 | Disposition: A | Discharge status: Home / Self Care | Payer: Commercial Managed Care - PPO | Source: Ambulatory Visit | Attending: Interventional Radiology | Admitting: Interventional Radiology

## 2022-06-18 DIAGNOSIS — M519 Unspecified thoracic, thoracolumbar and lumbosacral intervertebral disc disorder: Secondary | ICD-10-CM | POA: Insufficient documentation

## 2022-06-18 MED ORDER — GADOBUTROL 1 MMOL/ML IV SOLN
6.0000 mL | Freq: Once | INTRAVENOUS | Status: AC | PRN
  Administered 2022-06-18: 6 mL via INTRAVENOUS

## 2022-06-25 ENCOUNTER — Ambulatory Visit
Admission: RE | Admit: 2022-06-25 | Discharge: 2022-06-25 | Disposition: A | Discharge status: Home / Self Care | Payer: Commercial Managed Care - PPO | Source: Ambulatory Visit | Attending: Interventional Radiology | Admitting: Interventional Radiology

## 2022-06-25 DIAGNOSIS — M519 Unspecified thoracic, thoracolumbar and lumbosacral intervertebral disc disorder: Secondary | ICD-10-CM

## 2022-06-25 HISTORY — PX: IR RADIOLOGIST EVAL & MGMT: IMG5224

## 2022-06-25 NOTE — Progress Notes (Signed)
Chief Complaint: Patient was consulted remotely today (TeleHealth) for hepatocellular cancer at the request of Sonu Kruckenberg K.    Referring Physician(s): Roosevelt Locks, NP  History of Present Illness: Corey Maroney Sr. is a 66 y.o. male with a history of hepatitis C now status post 12-week treatment with Epclusa with a sustained virologic response and cure.  Prior to his treatment, he was confirmed to have F4 fibrosis.  Surveillance imaging demonstrated a small indeterminate solid lesion measuring 8 mm in October 2019.  Follow-up ultrasound in July 2020 demonstrated slight interval enlargement to 9 mm.  Dedicated MRI with Eovist contrast performed 12/08/2019 demonstrated 2 small lesions in the periphery of the right hepatic lobe which demonstrated imaging characteristics consistent with LI-RADS Category 5 definitive hepatocellular carcinoma.  The lesions measure 11 and 10 mm respectively.   He underwent successful percutaneous microwave ablation of the 2 lesions on 03/01/2020.  His postprocedural course was not complicated and he has recovered fully.  He is currently asymptomatic and doing well.  He denies abdominal pain, nausea, vomiting, decreased appetite, fever or chills. He is working full time and doing "excellent".    Initial surveillance MRI dated 06/02/2020 demonstrates successful ablation of the 2 adjacent sites in hepatic segment 6 with no residual contrast enhancement.  A few scattered subcentimeter foci of arterial hyperenhancement are also noted along the anterior right hemiliver measuring no more than 6 mm consistent with LI-RADS Category 3, nonspecific lesions.     MRI 09/02/20 - no evidence of residual or recurrent disease at the prior microwave ablation site.  Areas of arterial hyperenhancement seen on a prior study not visible on today's study suggesting that they simply represented incidental areas of perfusion defect.  Subtle 7 mm focus of restricted diffusion in  the most superior and central aspect of the lateral segment of the left lobe warrants attention on follow-up imaging.   MRI 11/27/20 - No evidence of locally recurrent hepatocellular carcinoma or other suspicious liver lesions.   MRI 04/03/21 - Stable ablation defect in the lateral right hepatic lobe, without evidence of recurrent tumor at this site.  5 mm hypovascular lesion in segment 5 of the right lobe was not definitely seen on previous study, but is too small to characterize.  This shows no arterial phase hyperenhancement. Recommend continued follow-up by MRI in 6 months.  MRI 06/01/22 - 1. LR TR equivocal but suspicious findings with respect to previously treated disease in the RIGHT hepatic lobe given nodular enhancement adjacent and anterior superior to the ablation zone as described. 2. Given lack of late arterial phase due to bolus timing a second area of abnormality within the liver is suspicious for additional site of hepatocellular carcinoma showing washout appearance and capsule appearance on current imaging, cardiac pulsation limiting assessment in the area of concern. 3. Suspect bony metastatic disease in the lower lumbar spine at L5 level. Dedicated spinal imaging may be helpful for further evaluation.  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 There is probable trace ventral epidural extension.  Past Medical History:  Diagnosis Date   Cancer ([place])    Hepatitis    HX OF hEP c ?    Murmur, heart 1963    Past Surgical History:  Procedure Laterality Date   IR RADIOLOGIST EVAL & MGMT  01/25/2020   IR RADIOLOGIST EVAL & MGMT  03/21/2020   IR RADIOLOGIST EVAL & MGMT  06/07/2020   IR RADIOLOGIST EVAL &  MGMT  09/05/2020   IR RADIOLOGIST EVAL & MGMT  12/05/2020   IR RADIOLOGIST EVAL & MGMT  05/09/2021   IR RADIOLOGIST EVAL & MGMT  09/06/2021   IR RADIOLOGIST EVAL & MGMT  12/12/2021   RADIOLOGY WITH ANESTHESIA N/A 03/01/2020   Procedure: CT WITH  ANESTHESIA  THERMAL MICROWAVE  ABLATIION;  Surgeon: Jacqulynn Cadet, MD;  Location: [facility];  Service: Anesthesiology;  Laterality: N/A;    Allergies: Patient has no known allergies.  Medications: Prior to Admission medications   Medication Sig Start Date End Date Taking? Authorizing Provider  cetirizine (ZYRTEC) 10 MG tablet Take 10 mg by mouth daily as needed for allergies.    [provider]  loratadine (CLARITIN) 10 MG tablet Take 10 mg by mouth daily as needed for allergies.    [provider]  oxyCODONE-acetaminophen (PERCOCET) 5-325 MG tablet Take 1 tablet by mouth every 6 (six) hours as needed for moderate pain or severe pain. 03/02/20   Louk, Bea Graff, PA-C     Family History  Problem Relation Age of Onset   Arthritis Mother    Cancer Neg Hx    Diabetes Neg Hx    Drug abuse Neg Hx    Heart disease Neg Hx    Hyperlipidemia Neg Hx    Hypertension Neg Hx    Kidney disease Neg Hx    Stroke Neg Hx     Social History   Socioeconomic History   Marital status: Divorced    Spouse name: Not on file   Number of children: 2   Years of education: 12   Highest education level: Not on file  Occupational History   Not on file  Tobacco Use   Smoking status: Some Days    Types: Cigars   Smokeless tobacco: Never   Tobacco comments:    3 per week  Vaping Use   Vaping Use: Never used  Substance and Sexual Activity   Alcohol use: Not Currently   Drug use: No   Sexual activity: Not Currently  Other Topics Concern   Not on file  Social History Narrative   Not on file   Social Determinants of Health   Financial Resource Strain: Not on file  Food Insecurity: Not on file  Transportation Needs: Not on file  Physical Activity: Not on file  Stress: Not on file  Social Connections: Not on file    ECOG Status: 0 - Asymptomatic  Review of Systems  Review of Systems: A 12 point ROS discussed and pertinent positives are indicated in the HPI above.  All  other systems are negative.    Physical Exam No direct physical exam was performed (except for noted visual exam findings with Video Visits).    Vital Signs: There were no vitals taken for this visit.  Imaging: MR Lumbar Spine W Wo Contrast  Result Date: 06/18/2022 CLINICAL DATA:  Abnormal abdominal MRI with L5 bone lesion, history of hepatocellular carcinoma EXAM: MRI LUMBAR SPINE WITHOUT AND WITH CONTRAST TECHNIQUE: Multiplanar and multiecho pulse sequences of the lumbar spine were obtained without and with intravenous contrast. CONTRAST:  46m GADAVIST GADOBUTROL 1 MMOL/ML IV SOLN COMPARISON:  None Available. FINDINGS: Segmentation:  Standard. Alignment:  Preserved. Vertebrae: Vertebral body heights are maintained. Abnormal marrow signal and enhancement at the posterior aspect of the L5 vertebral body abutting the inferior endplate. Probable trace ventral epidural extension. No additional osseous lesion in the included field of view. Conus medullaris and cauda equina: Conus extends to  the L1 level. Conus and cauda equina appear normal. Paraspinal and other soft tissues: Unremarkable. Disc levels: L1-L2:  No canal or foraminal stenosis. L2-L3:  Disc bulge.  No canal or foraminal stenosis. L3-L4:  Disc bulge.  No canal or foraminal stenosis. L4-L5: Disc bulge with left foraminal/far lateral annular fissure. No canal stenosis. Mild foraminal stenosis. L5-S1:  Disc bulge.  No canal or foraminal stenosis. 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 There is probable trace ventral epidural extension. Electronically Signed   By: Macy Mis M.D.   On: 06/18/2022 11:56   MR ABDOMEN WWO CONTRAST  Result Date: 06/03/2022 CLINICAL DATA:  A 66 year old male presents with history of hepatocellular carcinoma for follow-up. EXAM: MRI ABDOMEN WITHOUT AND WITH CONTRAST TECHNIQUE: Multiplanar multisequence MR imaging of the abdomen was performed both before and after the  administration of intravenous contrast. CONTRAST:  24m MULTIHANCE GADOBENATE DIMEGLUMINE 529 MG/ML IV SOLN COMPARISON:  December 07, 2021. FINDINGS: Lower chest: Is lung bases are unremarkable to the extent evaluated on this abdominal MRI. Hepatobiliary: Smooth hepatic contours. Ablation zones in the RIGHT hemiliver without change since previous imaging. Dominant area measuring 2.3 x 1.4 cm (image 33/11) area displays no nodular arterial phase enhancement. Contiguous area of mixed signal extending inferiorly without change as well. There is intrinsic T1 hyperintensity. (Image 33/15) subtle nodular area at the upper margin of the ablation zone not seen on previous imaging measuring 6 mm, visible on venous phase more so than arterial phase. This does not appear to washout. Above finding is LR TR equivocal but suspicious. T1 hypointensity (image 19/ 11) 15 mm relatively hypointense on post-contrast imaging though chew late arterial phase is not present on the current study and there is artifact in the area at related to cardiac motion. Area also visible as a subdermal select subdermal subtle abnormality on diffusion (image 80/7) not seen on previous imaging suggestion of washout and capsule appearance on image 48/18. Difficulty categorized with LI-RADS due to lack of late arterial phase imaging but suspicious for metastatic lesion or new site of hepatocellular carcinoma. Subtle abnormality in the caudate lobe also on diffusion. Portal vein is patent. No overt signs of portal hypertension. Pancreas:  Normal, without mass, inflammation or ductal dilatation. Spleen:  Unremarkable. Adrenals/Urinary Tract: Adrenal glands are normal. There is symmetric renal enhancement in there are cystic lesions of varying complexity somewhat T1 hyperintensity at baseline and others clearly representative of simple cysts. There is no hydronephrosis or perinephric stranding. These are compatible with Bosniak category I and II lesions in the  kidney for which no dedicated imaging follow-up is recommended. Stomach/Bowel: No acute gastrointestinal process to the extent evaluated on this abdominal MRI. Vascular/Lymphatic: No adenopathy in the abdomen. Patent abdominal vessels. Other:  No ascites Musculoskeletal: Site of bony metastatic disease on image 31/18 21 mm, very subtle area of abnormality seen on previous imaging in this location though not definitive, now enlarged and also seen on T2 weighted imaging, (image 16/4). IMPRESSION: 1. LR TR equivocal but suspicious findings with respect to previously treated disease in the RIGHT hepatic lobe given nodular enhancement adjacent and anterior superior to the ablation zone as described. 2. Given lack of late arterial phase due to bolus timing a second area of abnormality within the liver is suspicious for additional site of hepatocellular carcinoma showing washout appearance and capsule appearance on current imaging, cardiac pulsation limiting assessment in the area of concern. 3. Suspect bony metastatic disease in the lower  lumbar spine at L5 level. Dedicated spinal imaging may be helpful for further evaluation. These results will be called to the ordering clinician or representative by the Radiologist Assistant, and communication documented in the PACS or [employer]. Electronically Signed   By: Zetta Bills M.D.   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  He has evidence of recurrent disease in the liver as well as imaging findings highly concerning for osseous metastatic disease at the L5 vertebral body.  This is quite atypical given the small size of his intrinsic liver disease.  It would be very rare for extrahepatic metastasis.  I am suspicious that the lesion in L5 may represent a different process.  The new small lesion in the liver position between the 2 prior ablation sites likely does represent a small marginal recurrence.  This could easily be treated with repeat percutaneous microwave ablation.  There is a second 1.5 cm lesion described in the superior and posterior aspect of hepatic segment 2 which is seen on only a few series.  I have independently reviewed the images and suspect that this may be artifactual.  We will monitor this area on future follow-up images but I do not recommend any specific intervention at this time.  1.)  I would like the patient to be scheduled at [facility] for a double procedure.  First, transpedicular biopsy of the L5 vertebral body lesion followed by a percutaneous microwave ablation of the recurrent hepatic disease.  Both procedures could be performed in CT.     Electronically Signed: Criselda Peaches 06/25/2022, 9:57 AM   I spent a total of  15 Minutes in remote  clinical consultation, greater than 50% of which was counseling/coordinating care for hepatocellular cancer.    Visit type: Audio only (telephone). Audio (no video) only due to patient preference. Alternative for in-person consultation at [facility], [address] [place], [place], [place]. This visit type was conducted due to national recommendations for restrictions regarding the COVID-19 Pandemic (e.g. social distancing).  This format is felt to be most appropriate for this patient at this time.  All issues noted in this document were discussed and addressed.

## 2022-06-28 ENCOUNTER — Other Ambulatory Visit (HOSPITAL_COMMUNITY): Payer: Self-pay | Admitting: Interventional Radiology

## 2022-06-28 DIAGNOSIS — K769 Liver disease, unspecified: Secondary | ICD-10-CM

## 2022-07-03 NOTE — Progress Notes (Signed)
Sent message, via epic in basket, requesting orders in epic from surgeon.  

## 2022-07-03 NOTE — Progress Notes (Addendum)
COVID Vaccine received:  '[]'$  No '[x]'$  Yes Date of any COVID positive Test in last 51 days:None  PCP - Webb Silversmith, NP Cardiologist - none  Transplant team- [facility] Carlis Stable, NP   [address]   [place], [place] [address]   [phone] (Work)   [phone] (Fax)   Chest x-ray - n/a EKG -  03-01-2020  Epic Stress Test - n/a ECHO - n/a Cardiac Cath - n/a  Pacemaker/ICD device     '[x]'$  N/A Spinal Cord Stimulator:'[x]'$  No '[]'$  Yes   Other Implants:   Bowel Prep - None per patient  History of Sleep Apnea? '[x]'$  No '[]'$  Yes   Sleep Study Date:   CPAP used?- '[x]'$  No '[]'$  Yes  (Instruct to bring their mask & Tubing)  Does the patient monitor blood sugar? '[]'$  No '[]'$  Yes  '[x]'$  N/A  Blood Thinner Instructions:none Aspirin Instructions:none Last Dose:  ERAS Protocol Ordered: '[x]'$  No  '[]'$  Yes PRE-SURGERY '[]'$  ENSURE  '[]'$  G2   Comments:  No orders from Dr. Laurence Ferrari at PST appt. Requested x2.  Patient has no history of either CAD, HTN or DM.  Activity level: Patient can  climb a flight of stairs without difficulty;  '[x]'$  No CP  '[x]'$  No SOB,   Anesthesia review: Cirrhosis, Hepatocellular cancer,   Patient denies shortness of breath, fever, cough and chest pain at PAT appointment.  Patient verbalized understanding and agreement to the Pre-Surgical Instructions that were given to them at this PAT appointment. Patient was also educated of the need to review these PAT instructions again prior to his/her surgery.I reviewed the appropriate phone numbers to call if they have any and questions or concerns.

## 2022-07-03 NOTE — Patient Instructions (Addendum)
DUE TO SPACE LIMITATIONS, ONLY TWO VISITORS  (aged 64 and older) ARE ALLOWED TO COME WITH YOU AND STAY IN THE WAITING ROOM DURING YOUR PRE OP AND PROCEDURE.   **NO VISITORS ARE ALLOWED IN THE SHORT STAY AREA OR RECOVERY ROOM!!**   You are not required to quarantine at this time prior to your surgery. However, you must do this: Hand Hygiene often Do NOT share personal items Notify your provider if you are in close contact with someone who has COVID or you develop fever 100.4 or greater, new onset of sneezing, cough, sore throat, shortness of breath or body aches.       Your procedure is scheduled on:  Wednesday July 17, 2022  Report to [facility] Main Entrance.  Report to admitting at: 10:00   AM  +++++Call this number if you have any questions or problems the morning of surgery [phone]  Do not eat food :After Midnight the night prior to your surgery/procedure.  After Midnight you may have the following liquids until   09:00 AM DAY OF SURGERY  Clear Liquid Diet Water Black Coffee (sugar ok, NO MILK/CREAM OR CREAMERS)  Tea (sugar ok, NO MILK/CREAM OR CREAMERS) regular and decaf                             Plain Jell-O (NO RED)                                           Fruit ices (not with fruit pulp, NO RED)                                     Popsicles (NO RED)                                                                  Juice: apple, WHITE grape, WHITE cranberry Sports drinks like Gatorade (NO RED)                   FOLLOW BOWEL PREP AND ANY ADDITIONAL PRE OP INSTRUCTIONS YOU RECEIVED FROM YOUR SURGEON'S OFFICE!!!   Oral Hygiene is also important to reduce your risk of infection.        Remember - BRUSH YOUR TEETH THE MORNING OF SURGERY WITH YOUR REGULAR TOOTHPASTE   Take ONLY these medicines the morning of surgery with A SIP OF WATER: none                    You may not have any metal on your body including  jewelry, and body piercing  Do not  wear  lotions, powders, perfumes or deodorant  Men may shave face and neck.  Contacts, Hearing Aids, dentures or bridgework may not be worn into surgery.    DO NOT [place]. PHARMACY WILL DISPENSE MEDICATIONS LISTED ON YOUR MEDICATION LIST TO YOU DURING YOUR ADMISSION [facility]!   Patients discharged on the day of surgery will not be allowed to drive home.  Someone NEEDS to stay with you for the first 24 hours after anesthesia.  Special Instructions: Bring a copy of your healthcare power of attorney and living will documents the day of surgery, if you wish to have them scanned into your [facility] Records- EPIC  Please read over the following fact sheets you were given: IF YOU HAVE QUESTIONS ABOUT YOUR PRE-OP INSTRUCTIONS, PLEASE CALL [facility] - Preparing for Surgery Before surgery, you can play an important role.  Because skin is not sterile, your skin needs to be as free of germs as possible.  You can reduce the number of germs on your skin by washing with CHG (chlorahexidine gluconate) soap before surgery.  CHG is an antiseptic cleaner which kills germs and bonds with the skin to continue killing germs even after washing. Please DO NOT use if you have an allergy to CHG or antibacterial soaps.  If your skin becomes reddened/irritated stop using the CHG and inform your nurse when you arrive at Short Stay. Do not shave (including legs and underarms) for at least 48 hours prior to the first CHG shower.  You may shave your face/neck.  Please follow these instructions carefully:  1.  Shower with CHG Soap the night before surgery and the  morning of surgery.  2.  If you choose to wash your hair, wash your hair first as usual with your normal  shampoo.  3.  After you shampoo, rinse your hair and body thoroughly to remove the shampoo.                             4.  Use CHG as you would any other liquid soap.  You can apply chg  directly to the skin and wash.  Gently with a scrungie or clean washcloth.  5.  Apply the CHG Soap to your body ONLY FROM THE NECK DOWN.   Do not use on face/ open                           Wound or open sores. Avoid contact with eyes, ears mouth and genitals (private parts).                       Wash face,  Genitals (private parts) with your normal soap.             6.  Wash thoroughly, paying special attention to the area where your  surgery  will be performed.  7.  Thoroughly rinse your body with warm water from the neck down.  8.  DO NOT shower/wash with your normal soap after using and rinsing off the CHG Soap.            9.  Pat yourself dry with a clean towel.            10.  Wear clean pajamas.            11.  Place clean sheets on your bed the night of your first shower and do not  sleep with pets.  ON THE DAY OF SURGERY : Do not apply any lotions/deodorants the morning of surgery.  Please wear clean clothes to the hospital/surgery center.    FAILURE TO FOLLOW THESE INSTRUCTIONS MAY RESULT IN THE CANCELLATION OF YOUR SURGERY  PATIENT SIGNATURE_________________________________  NURSE SIGNATURE__________________________________  ________________________________________________________________________

## 2022-07-05 ENCOUNTER — Encounter (HOSPITAL_COMMUNITY): Payer: Self-pay

## 2022-07-05 ENCOUNTER — Other Ambulatory Visit: Payer: Self-pay

## 2022-07-05 ENCOUNTER — Encounter (HOSPITAL_COMMUNITY)
Admission: RE | Admit: 2022-07-05 | Discharge: 2022-07-05 | Disposition: A | Discharge status: Home / Self Care | Payer: Commercial Managed Care - PPO | Source: Ambulatory Visit | Attending: Interventional Radiology | Admitting: Interventional Radiology

## 2022-07-05 VITALS — BP 192/74 | HR 64 | Temp 98.6°F | Resp 14 | Ht 67.0 in | Wt 134.0 lb

## 2022-07-05 DIAGNOSIS — K769 Liver disease, unspecified: Secondary | ICD-10-CM | POA: Insufficient documentation

## 2022-07-05 DIAGNOSIS — Z01818 Encounter for other preprocedural examination: Secondary | ICD-10-CM | POA: Diagnosis present

## 2022-07-05 DIAGNOSIS — D649 Anemia, unspecified: Secondary | ICD-10-CM | POA: Insufficient documentation

## 2022-07-05 HISTORY — DX: Headache, unspecified: R51.9

## 2022-07-05 LAB — CBC
HCT: 43.6 % (ref 39.0–52.0)
Hemoglobin: 14.2 g/dL (ref 13.0–17.0)
MCH: 31.5 pg (ref 26.0–34.0)
MCHC: 32.6 g/dL (ref 30.0–36.0)
MCV: 96.7 fL (ref 80.0–100.0)
Platelets: 196 10*3/uL (ref 150–400)
RBC: 4.51 MIL/uL (ref 4.22–5.81)
RDW: 13.4 % (ref 11.5–15.5)
WBC: 5.2 10*3/uL (ref 4.0–10.5)
nRBC: 0 % (ref 0.0–0.2)

## 2022-07-05 LAB — COMPREHENSIVE METABOLIC PANEL
ALT: 14 U/L (ref 0–44)
AST: 17 U/L (ref 15–41)
Albumin: 4.2 g/dL (ref 3.5–5.0)
Alkaline Phosphatase: 77 U/L (ref 38–126)
Anion gap: 7 (ref 5–15)
BUN: 20 mg/dL (ref 8–23)
CO2: 24 mmol/L (ref 22–32)
Calcium: 9.6 mg/dL (ref 8.9–10.3)
Chloride: 109 mmol/L (ref 98–111)
Creatinine, Ser: 1.31 mg/dL — ABNORMAL HIGH (ref 0.61–1.24)
GFR, Estimated: >60 mL/min (ref >60)
Glucose, Bld: 94 mg/dL (ref 70–99)
Potassium: 4.5 mmol/L (ref 3.5–5.1)
Sodium: 140 mmol/L (ref 135–145)
Total Bilirubin: 0.5 mg/dL (ref 0.3–1.2)
Total Protein: 7.8 g/dL (ref 6.5–8.1)

## 2022-07-07 ENCOUNTER — Other Ambulatory Visit: Payer: Self-pay | Admitting: Radiology

## 2022-07-07 DIAGNOSIS — R160 Hepatomegaly, not elsewhere classified: Secondary | ICD-10-CM

## 2022-07-16 ENCOUNTER — Other Ambulatory Visit: Payer: Self-pay | Admitting: Radiology

## 2022-07-16 ENCOUNTER — Encounter (HOSPITAL_COMMUNITY): Payer: Self-pay

## 2022-07-16 NOTE — Anesthesia Preprocedure Evaluation (Signed)
Anesthesia Evaluation  Patient identified by MRN, date of birth, ID band Patient awake    Reviewed: Allergy & Precautions, NPO status , Patient's Chart, lab work & pertinent test results  History of Anesthesia Complications Negative for: history of anesthetic complications  Airway Mallampati: I  TM Distance: >3 FB Neck ROM: Full    Dental  (+) Edentulous Lower, Edentulous Upper   Pulmonary neg pulmonary ROS, former smoker,    Pulmonary exam normal        Cardiovascular negative cardio ROS Normal cardiovascular exam     Neuro/Psych  Headaches, negative psych ROS   GI/Hepatic negative GI ROS, Hepatocellular carcinoma   Endo/Other  negative endocrine ROS  Renal/GU Cr 1.31  negative genitourinary   Musculoskeletal negative musculoskeletal ROS (+)   Abdominal   Peds  Hematology negative hematology ROS (+)   Anesthesia Other Findings Day of surgery medications reviewed with patient.  Reproductive/Obstetrics negative OB ROS                            Anesthesia Physical Anesthesia Plan  ASA: 2  Anesthesia Plan: General   Post-op Pain Management:    Induction: Intravenous  PONV Risk Score and Plan: 2 and Treatment may vary due to age or medical condition, Ondansetron, Dexamethasone and Midazolam  Airway Management Planned: Oral ETT  Additional Equipment: None  Intra-op Plan:   Post-operative Plan: Extubation in OR  Informed Consent: I have reviewed the patients History and Physical, chart, labs and discussed the procedure including the risks, benefits and alternatives for the proposed anesthesia with the patient or authorized representative who has indicated his/her understanding and acceptance.     Dental advisory given  Plan Discussed with: CRNA  Anesthesia Plan Comments:        Anesthesia Quick Evaluation

## 2022-07-16 NOTE — H&P (Incomplete)
  The note originally documented on this encounter has been moved the the encounter in which it belongs.  

## 2022-07-16 NOTE — H&P (Signed)
Chief Complaint: Patient was seen in consultation today for hepatocellular carcinoma, L5 vertebral body lesion at the request of McCullough,Heath K  Referring Physician(s): [place] Physician: Jacqulynn Cadet  Patient Status: [facility] - Out-pt  History of Present Illness: Corey Vernon Sr. is a 66 y.o. 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  Patient is well-known to IR having underwent successful percutaneous microwave ablation 03/01/2020 with Dr. Laurence Ferrari.  Patient underwent surveillance MR abdomen 06/01/2022 that was suspicious for right hepatic lobe with nodular enhancement adjacent and anterior superior to previous ablation zone.  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  MR lumbar 06/18/2022 showed L5 vertebral body lesion concerning with metastasis.  Patient had telehealth consult with Dr. Laurence Ferrari, [place], 06/25/2022 where treatment options were discussed.  At that time patient decided he would like to proceed with percutaneous microwave ablation of new liver lesion and trans pedicular biopsy of L5 vertebral body lesion.  Past Medical History:  Diagnosis Date   Cancer ([place])    Headache    migraines   Hepatitis    HX OF hEP c ?    Murmur, heart 1963    Past Surgical History:  Procedure Laterality Date   IR RADIOLOGIST EVAL & MGMT  01/25/2020   IR RADIOLOGIST EVAL & MGMT  03/21/2020   IR RADIOLOGIST EVAL & MGMT  06/07/2020   IR RADIOLOGIST EVAL & MGMT  09/05/2020   IR RADIOLOGIST EVAL & MGMT  12/05/2020   IR RADIOLOGIST EVAL & MGMT  05/09/2021   IR RADIOLOGIST EVAL & MGMT  09/06/2021   IR RADIOLOGIST EVAL & MGMT  12/12/2021   IR RADIOLOGIST EVAL & MGMT  06/25/2022   RADIOLOGY WITH ANESTHESIA N/A 03/01/2020   Procedure: CT WITH ANESTHESIA  THERMAL MICROWAVE  ABLATIION;  Surgeon: Jacqulynn Cadet, MD;  Location: [facility] ORS;  Service: Anesthesiology;   Laterality: N/A;    Allergies: Patient has no known allergies.  Medications: Prior to Admission medications   Medication Sig Start Date End Date Taking? Authorizing Provider  oxyCODONE-acetaminophen (PERCOCET) 5-325 MG tablet Take 1 tablet by mouth every 6 (six) hours as needed for moderate pain or severe pain. Patient not taking: Reported on 06/28/2022 03/02/20   Ronney Lion, PA-C     Family History  Problem Relation Age of Onset   Arthritis Mother    Cancer Neg Hx    Diabetes Neg Hx    Drug abuse Neg Hx    Heart disease Neg Hx    Hyperlipidemia Neg Hx    Hypertension Neg Hx    Kidney disease Neg Hx    Stroke Neg Hx     Social History   Socioeconomic History   Marital status: Divorced    Spouse name: Not on file   Number of children: 2   Years of education: 12   Highest education level: Not on file  Occupational History   Not on file  Tobacco Use   Smoking status: Former   Smokeless tobacco: Never   Tobacco comments:    3 per week  Vaping Use   Vaping Use: Never used  Substance and Sexual Activity   Alcohol use: Not Currently   Drug use: No   Sexual activity: Not Currently  Other Topics Concern   Not on file  Social History Narrative   Not on file   Social Determinants of Health   Financial Resource Strain: Not on  file  Food Insecurity: Not on file  Transportation Needs: Not on file  Physical Activity: Not on file  Stress: Not on file  Social Connections: Not on file     Review of Systems: A 12 point ROS discussed and pertinent positives are indicated in the HPI above.  All other systems are negative.  Review of Systems  All other systems reviewed and are negative.   Vital Signs: There were no vitals taken for this visit.    Physical Exam Vitals reviewed.  Constitutional:      General: He is not in acute distress.    Appearance: Normal appearance. He is not ill-appearing.  HENT:     Head: Normocephalic and atraumatic.      Mouth/Throat:     Mouth: Mucous membranes are dry.     Pharynx: Oropharynx is clear.  Eyes:     Extraocular Movements: Extraocular movements intact.     Pupils: Pupils are equal, round, and reactive to light.  Cardiovascular:     Rate and Rhythm: Normal rate and regular rhythm.     Pulses: Normal pulses.     Heart sounds: Normal heart sounds. No murmur heard. Pulmonary:     Effort: Pulmonary effort is normal. No respiratory distress.     Breath sounds: Normal breath sounds.  Abdominal:     General: Bowel sounds are normal. There is no distension.     Palpations: Abdomen is soft.     Tenderness: There is no abdominal tenderness. There is no guarding.  Musculoskeletal:     Right lower leg: No edema.     Left lower leg: No edema.  Skin:    General: Skin is warm and dry.  Neurological:     Mental Status: He is alert and oriented to person, place, and time.  Psychiatric:        Mood and Affect: Mood normal.        Behavior: Behavior normal.        Thought Content: Thought content normal.        Judgment: Judgment normal.     Imaging: IR Radiologist Eval & Mgmt  Result Date: 06/25/2022 Please refer to notes tab for details about interventional procedure. (Op Note)  MR Lumbar Spine W Wo Contrast  Result Date: 06/18/2022 CLINICAL DATA:  Abnormal abdominal MRI with L5 bone lesion, history of hepatocellular carcinoma EXAM: MRI LUMBAR SPINE WITHOUT AND WITH CONTRAST TECHNIQUE: Multiplanar and multiecho pulse sequences of the lumbar spine were obtained without and with intravenous contrast. CONTRAST:  25m GADAVIST GADOBUTROL 1 MMOL/ML IV SOLN COMPARISON:  None Available. FINDINGS: Segmentation:  Standard. Alignment:  Preserved. Vertebrae: Vertebral body heights are maintained. Abnormal marrow signal and enhancement at the posterior aspect of the L5 vertebral body abutting the inferior endplate. Probable trace ventral epidural extension. No additional osseous lesion in the included field of  view. Conus medullaris and cauda equina: Conus extends to the L1 level. Conus and cauda equina appear normal. Paraspinal and other soft tissues: Unremarkable. Disc levels: L1-L2:  No canal or foraminal stenosis. L2-L3:  Disc bulge.  No canal or foraminal stenosis. L3-L4:  Disc bulge.  No canal or foraminal stenosis. L4-L5: Disc bulge with left foraminal/far lateral annular fissure. No canal stenosis. Mild foraminal stenosis. L5-S1:  Disc bulge.  No canal or foraminal stenosis. 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 There is probable trace ventral epidural extension. Electronically Signed   By: PMacy MisM.D.   On: 06/18/2022  11:56    Labs:  CBC: Recent Labs    09/19/21 1412 07/05/22 0812 07/17/22 0747  WBC 5.2 5.2 4.8  HGB 13.4 14.2 13.6  HCT 40.0 43.6 41.9  PLT 175 196 180    COAGS: Recent Labs    09/19/21 1412 07/17/22 0747  INR 1.1 1.1    BMP: Recent Labs    09/19/21 1412 07/05/22 0812  NA 139 140  K 4.1 4.5  CL 105 109  CO2 28 24  GLUCOSE 77 94  BUN 12 20  CALCIUM 9.8 9.6  CREATININE 1.07 1.31*  GFRNONAA  --  >60    LIVER FUNCTION TESTS: Recent Labs    09/19/21 1412 07/05/22 0812  BILITOT 0.4  0.4 0.5  AST '16  16 17  '$ ALT 8*  8* 14  ALKPHOS  --  77  PROT 7.6  7.6 7.8  ALBUMIN  --  4.2    TUMOR MARKERS: Recent Labs    09/19/21 1412  AFPTM 2.5    Assessment and Plan: 66 year old male with past medical history of HCV cirrhosis and hepatocellular carcinoma presents today for percutaneous microwave ablation of new liver lesion and L5 vertebral body lesion biopsy.  Pt resting on stretcher.  He is A&O, calm and pleasant.  He is in no distress.  Pt is NPO per order.  He denies the use of blood thinning medications.  Today's labs WNL.  Risks and benefits of transpedicular L5 vertebral body lesion biopsy with moderate sedation was discussed with the patient and/or patient's family including, but not limited to  bleeding, infection, damage to adjacent structures or low yield requiring additional tests.  Risks and benefits of percutaneous microwave hepatic ablation with general anesthesia discussed with the patient including, but not limited to bleeding, infection, liver failure, bile duct injury, pneumothorax or damage to adjacent structures.  All of the patient's questions were answered, patient is agreeable to proceed. Consent signed and in chart.   All of the questions were answered and there is agreement to proceed.  Consent signed and in chart.   Thank you for this interesting consult.  I greatly enjoyed meeting Corey Riebel Sr. and look forward to participating in their care.  A copy of this report was sent to the requesting provider on this date.  Electronically Signed: Tyson Alias, NP 07/17/2022, 8:07 AM   I spent a total of 20 minutes in face to face in clinical consultation, greater than 50% of which was counseling/coordinating care for hepatocellular carcinoma, L5 vertebral body lesion.

## 2022-07-17 ENCOUNTER — Encounter (HOSPITAL_COMMUNITY): Payer: Self-pay

## 2022-07-17 ENCOUNTER — Ambulatory Visit (HOSPITAL_COMMUNITY): Payer: Commercial Managed Care - PPO | Admitting: Anesthesiology

## 2022-07-17 ENCOUNTER — Ambulatory Visit (HOSPITAL_COMMUNITY)
Admission: RE | Admit: 2022-07-17 | Discharge: 2022-07-17 | Disposition: A | Discharge status: Home / Self Care | Payer: Commercial Managed Care - PPO | Source: Ambulatory Visit | Attending: Interventional Radiology | Admitting: Interventional Radiology

## 2022-07-17 ENCOUNTER — Ambulatory Visit (HOSPITAL_BASED_OUTPATIENT_CLINIC_OR_DEPARTMENT_OTHER): Payer: Commercial Managed Care - PPO | Admitting: Anesthesiology

## 2022-07-17 ENCOUNTER — Ambulatory Visit (HOSPITAL_COMMUNITY)
Admission: RE | Admit: 2022-07-17 | Discharge: 2022-07-17 | Disposition: A | Discharge status: Home / Self Care | Payer: Commercial Managed Care - PPO | Attending: Interventional Radiology | Admitting: Interventional Radiology

## 2022-07-17 ENCOUNTER — Ambulatory Visit (HOSPITAL_COMMUNITY): Payer: Commercial Managed Care - PPO

## 2022-07-17 ENCOUNTER — Encounter (HOSPITAL_COMMUNITY): Payer: Self-pay | Admitting: Anesthesiology

## 2022-07-17 ENCOUNTER — Encounter (HOSPITAL_COMMUNITY)
Admission: RE | Disposition: A | Discharge status: Home / Self Care | Payer: Self-pay | Source: Home / Self Care | Attending: Interventional Radiology

## 2022-07-17 DIAGNOSIS — C7951 Secondary malignant neoplasm of bone: Secondary | ICD-10-CM | POA: Insufficient documentation

## 2022-07-17 DIAGNOSIS — K769 Liver disease, unspecified: Secondary | ICD-10-CM | POA: Insufficient documentation

## 2022-07-17 DIAGNOSIS — C220 Liver cell carcinoma: Secondary | ICD-10-CM

## 2022-07-17 DIAGNOSIS — R160 Hepatomegaly, not elsewhere classified: Secondary | ICD-10-CM

## 2022-07-17 DIAGNOSIS — Z87891 Personal history of nicotine dependence: Secondary | ICD-10-CM | POA: Insufficient documentation

## 2022-07-17 DIAGNOSIS — K7460 Unspecified cirrhosis of liver: Secondary | ICD-10-CM | POA: Insufficient documentation

## 2022-07-17 DIAGNOSIS — Z8505 Personal history of malignant neoplasm of liver: Secondary | ICD-10-CM | POA: Diagnosis not present

## 2022-07-17 DIAGNOSIS — M899 Disorder of bone, unspecified: Secondary | ICD-10-CM

## 2022-07-17 HISTORY — PX: RADIOLOGY WITH ANESTHESIA: SHX6223

## 2022-07-17 LAB — COMPREHENSIVE METABOLIC PANEL
ALT: 14 U/L (ref 0–44)
AST: 19 U/L (ref 15–41)
Albumin: 4.2 g/dL (ref 3.5–5.0)
Alkaline Phosphatase: 70 U/L (ref 38–126)
Anion gap: 7 (ref 5–15)
BUN: 18 mg/dL (ref 8–23)
CO2: 23 mmol/L (ref 22–32)
Calcium: 9.6 mg/dL (ref 8.9–10.3)
Chloride: 112 mmol/L — ABNORMAL HIGH (ref 98–111)
Creatinine, Ser: 1.16 mg/dL (ref 0.61–1.24)
GFR, Estimated: >60 mL/min (ref >60)
Glucose, Bld: 93 mg/dL (ref 70–99)
Potassium: 4.5 mmol/L (ref 3.5–5.1)
Sodium: 142 mmol/L (ref 135–145)
Total Bilirubin: 0.4 mg/dL (ref 0.3–1.2)
Total Protein: 7.8 g/dL (ref 6.5–8.1)

## 2022-07-17 LAB — TYPE AND SCREEN
ABO/RH(D): O POS
Antibody Screen: NEGATIVE

## 2022-07-17 LAB — CBC WITH DIFFERENTIAL/PLATELET
Abs Immature Granulocytes: 0.02 10*3/uL (ref 0.00–0.07)
Basophils Absolute: 0 10*3/uL (ref 0.0–0.1)
Basophils Relative: 1 %
Eosinophils Absolute: 0.2 10*3/uL (ref 0.0–0.5)
Eosinophils Relative: 4 %
HCT: 41.9 % (ref 39.0–52.0)
Hemoglobin: 13.6 g/dL (ref 13.0–17.0)
Immature Granulocytes: 0 %
Lymphocytes Relative: 29 %
Lymphs Abs: 1.4 10*3/uL (ref 0.7–4.0)
MCH: 32.1 pg (ref 26.0–34.0)
MCHC: 32.5 g/dL (ref 30.0–36.0)
MCV: 98.8 fL (ref 80.0–100.0)
Monocytes Absolute: 0.5 10*3/uL (ref 0.1–1.0)
Monocytes Relative: 11 %
Neutro Abs: 2.6 10*3/uL (ref 1.7–7.7)
Neutrophils Relative %: 55 %
Platelets: 180 10*3/uL (ref 150–400)
RBC: 4.24 MIL/uL (ref 4.22–5.81)
RDW: 13.5 % (ref 11.5–15.5)
WBC: 4.8 10*3/uL (ref 4.0–10.5)
nRBC: 0 % (ref 0.0–0.2)

## 2022-07-17 LAB — PROTIME-INR
INR: 1.1 (ref 0.8–1.2)
Prothrombin Time: 13.9 seconds (ref 11.4–15.2)

## 2022-07-17 SURGERY — RADIOLOGY WITH ANESTHESIA
Anesthesia: General

## 2022-07-17 MED ORDER — FENTANYL CITRATE (PF) 100 MCG/2ML IJ SOLN
INTRAMUSCULAR | Status: AC
  Filled 2022-07-17: qty 4

## 2022-07-17 MED ORDER — MIDAZOLAM HCL 2 MG/2ML IJ SOLN
INTRAMUSCULAR | Status: AC
  Filled 2022-07-17: qty 2

## 2022-07-17 MED ORDER — MIDAZOLAM HCL 2 MG/2ML IJ SOLN
INTRAMUSCULAR | Status: AC
  Filled 2022-07-17: qty 4

## 2022-07-17 MED ORDER — HYDROCODONE-ACETAMINOPHEN 5-325 MG PO TABS: 1.0000 | ORAL_TABLET | ORAL | Status: DC | PRN

## 2022-07-17 MED ORDER — ONDANSETRON HCL 4 MG/2ML IJ SOLN
4.0000 mg | Freq: Four times a day (QID) | INTRAMUSCULAR | Status: DC | PRN
Start: 2022-07-17 — End: 2022-07-17

## 2022-07-17 MED ORDER — KETOROLAC TROMETHAMINE 60 MG/2ML IM SOLN
INTRAMUSCULAR | Status: DC | PRN
  Administered 2022-07-17: 30 mg via INTRAMUSCULAR

## 2022-07-17 MED ORDER — SUGAMMADEX SODIUM 200 MG/2ML IV SOLN
INTRAVENOUS | Status: DC | PRN
  Administered 2022-07-17: 120 mg via INTRAVENOUS

## 2022-07-17 MED ORDER — MIDAZOLAM HCL 2 MG/2ML IJ SOLN
INTRAMUSCULAR | Status: AC | PRN
  Administered 2022-07-17 (×3): 1 mg via INTRAVENOUS

## 2022-07-17 MED ORDER — SODIUM CHLORIDE (PF) 0.9 % IJ SOLN
INTRAMUSCULAR | Status: AC
  Filled 2022-07-17: qty 50

## 2022-07-17 MED ORDER — ORAL CARE MOUTH RINSE: 15.0000 mL | Freq: Once | OROMUCOSAL | Status: AC

## 2022-07-17 MED ORDER — OXYCODONE HCL 5 MG PO TABS: 5.0000 mg | ORAL_TABLET | Freq: Once | ORAL | Status: DC | PRN

## 2022-07-17 MED ORDER — PROPOFOL 10 MG/ML IV BOLUS
INTRAVENOUS | Status: DC | PRN
  Administered 2022-07-17: 120 mg via INTRAVENOUS

## 2022-07-17 MED ORDER — FENTANYL CITRATE (PF) 250 MCG/5ML IJ SOLN
INTRAMUSCULAR | Status: DC | PRN
  Administered 2022-07-17: 25 ug via INTRAVENOUS

## 2022-07-17 MED ORDER — FENTANYL CITRATE (PF) 250 MCG/5ML IJ SOLN
INTRAMUSCULAR | Status: AC
  Filled 2022-07-17: qty 5

## 2022-07-17 MED ORDER — CHLORHEXIDINE GLUCONATE 0.12 % MT SOLN
15.0000 mL | Freq: Once | OROMUCOSAL | Status: AC
Start: 2022-07-17 — End: 2022-07-17
  Administered 2022-07-17: 15 mL via OROMUCOSAL

## 2022-07-17 MED ORDER — LACTATED RINGERS IV SOLN: INTRAVENOUS | Status: DC

## 2022-07-17 MED ORDER — IOHEXOL 300 MG/ML  SOLN
100.0000 mL | Freq: Once | INTRAMUSCULAR | Status: AC | PRN
  Administered 2022-07-17: 100 mL via INTRAVENOUS

## 2022-07-17 MED ORDER — PHENYLEPHRINE HCL-NACL 20-0.9 MG/250ML-% IV SOLN
INTRAVENOUS | Status: AC
  Filled 2022-07-17: qty 250

## 2022-07-17 MED ORDER — EPHEDRINE SULFATE-NACL 50-0.9 MG/10ML-% IV SOSY
PREFILLED_SYRINGE | INTRAVENOUS | Status: DC | PRN
  Administered 2022-07-17 (×4): 5 mg via INTRAVENOUS

## 2022-07-17 MED ORDER — OXYCODONE HCL 5 MG/5ML PO SOLN: 5.0000 mg | Freq: Once | ORAL | Status: DC | PRN

## 2022-07-17 MED ORDER — LIDOCAINE 2% (20 MG/ML) 5 ML SYRINGE
INTRAMUSCULAR | Status: DC | PRN
  Administered 2022-07-17: 60 mg via INTRAVENOUS

## 2022-07-17 MED ORDER — FENTANYL CITRATE (PF) 100 MCG/2ML IJ SOLN
INTRAMUSCULAR | Status: AC | PRN
  Administered 2022-07-17 (×3): 50 ug via INTRAVENOUS

## 2022-07-17 MED ORDER — SODIUM CHLORIDE 0.9 % IV SOLN
2.0000 g | Freq: Once | INTRAVENOUS | Status: AC
  Administered 2022-07-17: 2 g via INTRAVENOUS
  Filled 2022-07-17: qty 2

## 2022-07-17 MED ORDER — ROCURONIUM BROMIDE 10 MG/ML (PF) SYRINGE
PREFILLED_SYRINGE | INTRAVENOUS | Status: DC | PRN
  Administered 2022-07-17: 10 mg via INTRAVENOUS
  Administered 2022-07-17: 50 mg via INTRAVENOUS

## 2022-07-17 MED ORDER — FENTANYL CITRATE PF 50 MCG/ML IJ SOSY: 25.0000 ug | PREFILLED_SYRINGE | INTRAMUSCULAR | Status: DC | PRN

## 2022-07-17 MED ORDER — DOCUSATE SODIUM 100 MG PO CAPS: 100.0000 mg | ORAL_CAPSULE | Freq: Two times a day (BID) | ORAL | Status: DC

## 2022-07-17 MED ORDER — DEXAMETHASONE SODIUM PHOSPHATE 10 MG/ML IJ SOLN
INTRAMUSCULAR | Status: DC | PRN
  Administered 2022-07-17: 6 mg via INTRAVENOUS

## 2022-07-17 MED ORDER — ONDANSETRON HCL 4 MG/2ML IJ SOLN
INTRAMUSCULAR | Status: DC | PRN
  Administered 2022-07-17: 4 mg via INTRAVENOUS

## 2022-07-17 NOTE — Procedures (Signed)
Interventional Radiology Procedure Note  Procedure:  1.) CT guided bone biopsy of L5 2.) CT guided MWA of liver lesion  Complications: None  Estimated Blood Loss: None  Recommendations: - Recovery in PACU - Anticipate DC home unless issues with pain control   Signed,  Criselda Peaches, MD

## 2022-07-17 NOTE — Anesthesia Postprocedure Evaluation (Signed)
Anesthesia Post Note  Patient: Corey Brady Hopkinson Sr.  Procedure(s) Performed: CT MICROWAVE ABLATION     Patient location during evaluation: PACU Anesthesia Type: General Level of consciousness: awake and alert Pain management: pain level controlled Vital Signs Assessment: post-procedure vital signs reviewed and stable Respiratory status: spontaneous breathing, nonlabored ventilation and respiratory function stable Cardiovascular status: blood pressure returned to baseline Postop Assessment: no apparent nausea or vomiting Anesthetic complications: no   No notable events documented.  Last Vitals:  Vitals:   07/17/22 1215 07/17/22 1230  BP: (!) 193/81 (!) 183/68  Pulse: 60 (!) 58  Resp: 14 20  Temp:  36.4 C  SpO2: 96% 97%    Last Pain:  Vitals:   07/17/22 1230  TempSrc: Oral  PainSc: 0-No pain                 Marthenia Rolling

## 2022-07-17 NOTE — Transfer of Care (Signed)
Immediate Anesthesia Transfer of Care Note  Patient: Corey Chihuahua Sr.  Procedure(s) Performed: CT MICROWAVE ABLATION  Patient Location: PACU  Anesthesia Type:General  Level of Consciousness: awake  Airway & Oxygen Therapy: Patient Spontanous Breathing and Patient connected to face mask oxygen  Post-op Assessment: Report given to RN and Post -op Vital signs reviewed and stable  Post vital signs: Reviewed and stable  Last Vitals:  Vitals Value Taken Time  BP 176/68 07/17/22 1112  Temp    Pulse 62 07/17/22 1114  Resp 13 07/17/22 1114  SpO2 100 % 07/17/22 1114  Vitals shown include unvalidated device data.  Last Pain:  Vitals:   07/17/22 0740  TempSrc:   PainSc: 0-No pain         Complications: No notable events documented.

## 2022-07-17 NOTE — Anesthesia Procedure Notes (Signed)
Procedure Name: Intubation Date/Time: 07/17/2022 9:37 AM  Performed by: Sharlette Dense, CRNAPatient Re-evaluated:Patient Re-evaluated prior to induction Oxygen Delivery Method: Circle system utilized Preoxygenation: Pre-oxygenation with 100% oxygen Induction Type: IV induction Ventilation: Mask ventilation without difficulty and Oral airway inserted - appropriate to patient size Laryngoscope Size: Miller and 3 Grade View: Grade I Tube type: Oral Tube size: 7.5 mm Number of attempts: 1 Airway Equipment and Method: Stylet Placement Confirmation: ETT inserted through vocal cords under direct vision, positive ETCO2 and breath sounds checked- equal and bilateral Secured at: 22 cm Tube secured with: Tape Dental Injury: Teeth and Oropharynx as per pre-operative assessment

## 2022-07-18 ENCOUNTER — Encounter (HOSPITAL_COMMUNITY): Payer: Self-pay | Admitting: Interventional Radiology

## 2022-07-18 LAB — SURGICAL PATHOLOGY

## 2022-07-22 ENCOUNTER — Other Ambulatory Visit: Payer: Self-pay | Admitting: Interventional Radiology

## 2022-07-22 DIAGNOSIS — C220 Liver cell carcinoma: Secondary | ICD-10-CM

## 2022-07-23 ENCOUNTER — Other Ambulatory Visit: Payer: Self-pay | Admitting: *Deleted

## 2022-07-23 DIAGNOSIS — C220 Liver cell carcinoma: Secondary | ICD-10-CM

## 2022-07-23 DIAGNOSIS — C787 Secondary malignant neoplasm of liver and intrahepatic bile duct: Secondary | ICD-10-CM

## 2022-07-24 ENCOUNTER — Ambulatory Visit
Admission: RE | Admit: 2022-07-24 | Discharge: 2022-07-24 | Disposition: A | Discharge status: Home / Self Care | Payer: Commercial Managed Care - PPO | Source: Ambulatory Visit | Attending: Interventional Radiology | Admitting: Interventional Radiology

## 2022-07-24 ENCOUNTER — Encounter: Payer: Self-pay | Admitting: *Deleted

## 2022-07-24 HISTORY — PX: IR RADIOLOGIST EVAL & MGMT: IMG5224

## 2022-07-24 NOTE — Progress Notes (Signed)
Chief Complaint: Patient was seen in consultation today for hepatocellular cancer at the request of Edye Hainline K  Referring Physician(s): Roosevelt Locks, NP  History of Present Illness: Corey Bubolz Sr. is a 66 y.o. male with a history of hepatitis C now status post 12-week treatment with Epclusa with a sustained virologic response and cure.  Prior to his treatment, he was confirmed to have F4 fibrosis.  Surveillance imaging demonstrated a small indeterminate solid lesion measuring 8 mm in October 2019.  Follow-up ultrasound in July 2020 demonstrated slight interval enlargement to 9 mm.  Dedicated MRI with Eovist contrast performed 12/08/2019 demonstrated 2 small lesions in the periphery of the right hepatic lobe which demonstrated imaging characteristics consistent with LI-RADS Category 5 definitive hepatocellular carcinoma.  The lesions measure 11 and 10 mm respectively.   He underwent successful percutaneous microwave ablation of the 2 lesions on 03/01/2020.  His postprocedural course was not complicated and he has recovered fully.  He is currently asymptomatic and doing well.  He denies abdominal pain, nausea, vomiting, decreased appetite, fever or chills. He is working full time and doing "excellent".    Initial surveillance MRI dated 06/02/2020 demonstrates successful ablation of the 2 adjacent sites in hepatic segment 6 with no residual contrast enhancement.  A few scattered subcentimeter foci of arterial hyperenhancement are also noted along the anterior right hemiliver measuring no more than 6 mm consistent with LI-RADS Category 3, nonspecific lesions.     MRI 09/02/20 - no evidence of residual or recurrent disease at the prior microwave ablation site.  Areas of arterial hyperenhancement seen on a prior study not visible on today's study suggesting that they simply represented incidental areas of perfusion defect.  Subtle 7 mm focus of restricted diffusion in the most  superior and central aspect of the lateral segment of the left lobe warrants attention on follow-up imaging.   MRI 11/27/20 - No evidence of locally recurrent hepatocellular carcinoma or other suspicious liver lesions.   MRI 04/03/21 - Stable ablation defect in the lateral right hepatic lobe, without evidence of recurrent tumor at this site.  5 mm hypovascular lesion in segment 5 of the right lobe was not definitely seen on previous study, but is too small to characterize.  This shows no arterial phase hyperenhancement. Recommend continued follow-up by MRI in 6 months.   MRI 06/01/22 - 1. LR TR equivocal but suspicious findings with respect to previously treated disease in the RIGHT hepatic lobe given nodular enhancement adjacent and anterior superior to the ablation zone as described. 2. Given lack of late arterial phase due to bolus timing a second area of abnormality within the liver is suspicious for additional site of hepatocellular carcinoma showing washout appearance and capsule appearance on current imaging, cardiac pulsation limiting assessment in the area of concern. 3. Suspect bony metastatic disease in the lower lumbar spine at L5 level. Dedicated spinal imaging may be helpful for further evaluation.   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 There is probable trace ventral epidural extension.  Corey Armstrong underwent CT-guided core biopsy of the suspicious L5 vertebral body lesion as well as concurrent microwave ablation of the recurrent liver lesion. 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  Corey Armstrong and his wife came into the office today.  He is recovering well from his procedures.  He had some fairly significant lower back pain in the  first few days following the biopsy but this began improving significantly yesterday.  He is feeling good today.  No  issues with right upper quadrant pain, nausea, vomiting or other systemic issues related to his liver ablation.  I reviewed the biopsy results with him and his wife.  I let them know I will be referring them to Dr. Burr Medico to discuss systemic therapy.    Past Medical History:  Diagnosis Date   Cancer ([place])    Headache    migraines   Hepatitis    HX OF hEP c ?    Murmur, heart 1963    Past Surgical History:  Procedure Laterality Date   IR RADIOLOGIST EVAL & MGMT  01/25/2020   IR RADIOLOGIST EVAL & MGMT  03/21/2020   IR RADIOLOGIST EVAL & MGMT  06/07/2020   IR RADIOLOGIST EVAL & MGMT  09/05/2020   IR RADIOLOGIST EVAL & MGMT  12/05/2020   IR RADIOLOGIST EVAL & MGMT  05/09/2021   IR RADIOLOGIST EVAL & MGMT  09/06/2021   IR RADIOLOGIST EVAL & MGMT  12/12/2021   IR RADIOLOGIST EVAL & MGMT  06/25/2022   RADIOLOGY WITH ANESTHESIA N/A 03/01/2020   Procedure: CT WITH ANESTHESIA  THERMAL MICROWAVE  ABLATIION;  Surgeon: Jacqulynn Cadet, MD;  Location: [facility];  Service: Anesthesiology;  Laterality: N/A;   RADIOLOGY WITH ANESTHESIA N/A 07/17/2022   Procedure: CT MICROWAVE ABLATION;  Surgeon: Criselda Peaches, MD;  Location: [facility];  Service: Radiology;  Laterality: N/A;    Allergies: Patient has no known allergies.  Medications: Prior to Admission medications   Medication Sig Start Date End Date Taking? Authorizing Provider  oxyCODONE-acetaminophen (PERCOCET) 5-325 MG tablet Take 1 tablet by mouth every 6 (six) hours as needed for moderate pain or severe pain. Patient not taking: Reported on 06/28/2022 03/02/20   Ronney Lion, PA-C     Family History  Problem Relation Age of Onset   Arthritis Mother    Cancer Neg Hx    Diabetes Neg Hx    Drug abuse Neg Hx    Heart disease Neg Hx    Hyperlipidemia Neg Hx    Hypertension Neg Hx    Kidney disease Neg Hx    Stroke Neg Hx     Social History   Socioeconomic History   Marital status: Divorced    Spouse name: Not on file   Number of  children: 2   Years of education: 12   Highest education level: Not on file  Occupational History   Not on file  Tobacco Use   Smoking status: Former   Smokeless tobacco: Never   Tobacco comments:    3 per week  Vaping Use   Vaping Use: Never used  Substance and Sexual Activity   Alcohol use: Not Currently   Drug use: No   Sexual activity: Not Currently  Other Topics Concern   Not on file  Social History Narrative   Not on file   Social Determinants of Health   Financial Resource Strain: Not on file  Food Insecurity: Not on file  Transportation Needs: Not on file  Physical Activity: Not on file  Stress: Not on file  Social Connections: Not on file    ECOG Status: 0 - Asymptomatic  Review of Systems: A 12 point ROS discussed and pertinent positives are indicated in the HPI above.  All other systems are negative.  Review of Systems  Vital Signs: BP (!) 188/77 (BP Location: Left Arm)  Pulse 61   SpO2 100%     Physical Exam Constitutional:      General: He is not in acute distress.    Appearance: Normal appearance.  HENT:     Head: Normocephalic and atraumatic.  Cardiovascular:     Rate and Rhythm: Normal rate.  Pulmonary:     Effort: Pulmonary effort is normal.  Abdominal:     General: Abdomen is flat. There is no distension.     Tenderness: There is no abdominal tenderness.  Skin:    General: Skin is warm and dry.  Neurological:     Mental Status: He is alert and oriented to person, place, and time.  Psychiatric:        Mood and Affect: Mood normal.        Behavior: Behavior normal.       Imaging: CT GUIDE TISSUE ABLATION  Result Date: 07/17/2022 INDICATION: 66 year old male with hepatocellular carcinoma. He has undergone prior CT-guided ablation in April of 2021 and now has evidence of recurrent disease between the prior to ablation sites. He presents for repeat ablation. EXAM: CT-guided microwave ablation COMPARISON:  None Available.  MEDICATIONS: 2 g Ancef; The antibiotic was administered in an appropriate time interval prior to needle puncture of the skin. ANESTHESIA/SEDATION: General - as administered by the Anesthesia department FLUOROSCOPY: None. COMPLICATIONS: None immediate. TECHNIQUE: Informed written consent was obtained from the patient after a thorough discussion of the procedural risks, benefits and alternatives. All questions were addressed. Maximal Sterile Barrier Technique was utilized including caps, mask, sterile gowns, sterile gloves, sterile drape, hand hygiene and skin antiseptic. A timeout was performed prior to the initiation of the procedure. A planning axial CT scan was performed in the lesion was identified. Additionally, ultrasound evaluation was performed of the right upper quadrant identifying the expected location of the lesion between the prior ablation sites. Using a combination of ultrasound and CT guidance, a 15 cm PR XT probe was carefully advanced into position. Repeat CT imaging was performed confirming suitable antenna location. Ablation was then performed. The antenna was powered at 65 watts in ablation was performed under real-time ultrasound guidance for a total of 6 minutes. Post ablation CT imaging demonstrates an excellent ablation zone completely encompassing the mass. No evidence of bleeding or other complication. The patient was returned to PACU in good condition. FINDINGS: Successful microwave ablation IMPRESSION: Successful CT-guided microwave ablation of recurrent hepatocellular carcinoma. Signed, Criselda Peaches, MD, [facility] Radiology Electronically Signed   By: Jacqulynn Cadet M.D.   On: 07/17/2022 12:49   CT BONE TROCAR/NEEDLE BIOPSY SUPERFICIAL  Result Date: 07/17/2022 INDICATION: 66 year old male with a history of cirrhosis and hepatocellular carcinoma and an enhancing lesion within the L5 vertebral body concerning for possible  metastatic disease. He presents for CT-guided deep core biopsy of a bone lesion for further evaluation. EXAM: CT-guided core biopsy deep bone lesion TECHNIQUE: Multidetector CT imaging of the pelvis was performed following the standard protocol without IV contrast. RADIATION DOSE REDUCTION: This exam was performed according to the departmental dose-optimization program which includes automated exposure control, adjustment of the mA and/or kV according to patient size and/or use of iterative reconstruction technique. MEDICATIONS: None. ANESTHESIA/SEDATION: Moderate (conscious) sedation was employed during this procedure. A total of Versed 3 mg and Fentanyl 150 mcg was administered intravenously by the radiology nurse. Total intra-service moderate Sedation Time: 27 minutes. The patient's level of consciousness and vital signs were monitored continuously by radiology nursing  throughout the procedure under my direct supervision. COMPLICATIONS: None immediate. PROCEDURE: Informed written consent was obtained from the patient after a thorough discussion of the procedural risks, benefits and alternatives. All questions were addressed. Maximal Sterile Barrier Technique was utilized including caps, mask, sterile gowns, sterile gloves, sterile drape, hand hygiene and skin antiseptic. A timeout was performed prior to the initiation of the procedure. Initial CT imaging identifies the subtle lesion in the posterior aspect of the L5 vertebral body. A suitable skin entry site was selected and marked. The skin was sterilely prepped and draped in the standard fashion with chlorhexidine skin prep. Local anesthesia was attained by infiltration with 1% lidocaine. A small dermatotomy was made. Under intermittent CT guidance, an 11 gauge introducer needle was carefully advanced and positioned at the right-sided pedicle of L5. The introducer was then carefully guided from a superior to inferior tract along the pedicle and just into the  vertebral body such that the introducer was directed toward the lesion of interest. Smaller core bone biopsies were then obtained coaxially using the OnControl drill device. Biopsy specimens were placed in formalin and delivered to pathology for further analysis. Following biopsy, the outer trocar was removed. Follow-up CT imaging demonstrates no evidence of immediate complication. IMPRESSION: Successful CT-guided core biopsy of L5 bone lesion. Electronically Signed   By: Jacqulynn Cadet M.D.   On: 07/17/2022 12:44   DG Chest 1 View  Result Date: 07/17/2022 CLINICAL DATA:  Preop for liver ablation EXAM: CHEST  1 VIEW COMPARISON:  03/01/2020 FINDINGS: Normal mediastinum and cardiac silhouette. Normal pulmonary vasculature. No evidence of effusion, infiltrate, or pneumothorax. No acute bony abnormality. IMPRESSION: No acute cardiopulmonary process. Electronically Signed   By: Suzy Bouchard M.D.   On: 07/17/2022 09:18   IR Radiologist Eval & Mgmt  Result Date: 06/25/2022 Please refer to notes tab for details about interventional procedure. (Op Note)   Labs:  CBC: Recent Labs    09/19/21 1412 07/05/22 0812 07/17/22 0747  WBC 5.2 5.2 4.8  HGB 13.4 14.2 13.6  HCT 40.0 43.6 41.9  PLT 175 196 180    COAGS: Recent Labs    09/19/21 1412 07/17/22 0747  INR 1.1 1.1    BMP: Recent Labs    09/19/21 1412 07/05/22 0812 07/17/22 0747  NA 139 140 142  K 4.1 4.5 4.5  CL 105 109 112*  CO2 '28 24 23  '$ GLUCOSE 77 94 93  BUN '12 20 18  '$ CALCIUM 9.8 9.6 9.6  CREATININE 1.07 1.31* 1.16  GFRNONAA  --  >60 >60    LIVER FUNCTION TESTS: Recent Labs    09/19/21 1412 07/05/22 0812 07/17/22 0747  BILITOT 0.4  0.4 0.5 0.4  AST '16  16 17 19  '$ ALT 8*  8* 14 14  ALKPHOS  --  77 70  PROT 7.6  7.6 7.8 7.8  ALBUMIN  --  4.2 4.2    TUMOR MARKERS: Recent Labs    09/19/21 1412  AFPTM 2.5    Assessment and Plan:  66 year old gentleman with liver cirrhosis complicated by hepatocellular  carcinoma and unexpected osseous metastatic disease with biopsy-proven disease in the L5 vertebral body.  He is status post microwave ablation of the liver x2 but now has convincing evidence of extrahepatic disease.  I will refer him to Dr. Burr Medico of oncology.  He will need complete staging as well as consideration of systemic therapy and possible radiation therapy to the L5 lesion.  Currently, his L5 lesion is symptomatic.  If it becomes symptomatic, he may be a candidate for OsteoCool in the future.  1.) F/U MRI liver with gadolinium and clinic visit in 3 months  Thank you for this interesting consult.  I greatly enjoyed meeting Corey Tomes Sr. and look forward to participating in their care.  A copy of this report was sent to the requesting provider on this date.  Electronically Signed: Criselda Peaches 07/24/2022, 10:11 AM   I spent a total of  25 Minutes in face to face in clinical consultation, greater than 50% of which was counseling/coordinating care for liver cancer with new osseous metastatic disease.

## 2022-07-25 ENCOUNTER — Telehealth: Payer: Self-pay | Admitting: Physician Assistant

## 2022-07-25 NOTE — Telephone Encounter (Signed)
Scheduled appt per 9/13 referral. Pt is aware of appt date and time. Pt is aware to arrive 15 mins prior to appt time and to bring and updated insurance card. Pt is aware of appt location.   

## 2022-08-01 NOTE — Progress Notes (Unsigned)
[place] Telephone:([phone]) [phone]   [phone]  CONSULT NOTE  REFERRING PHYSICIAN: Dr. Laurence Ferrari  REASON FOR CONSULTATION:  Metastatic Hepatocellular Carcinoma   HPI Corey Plain Sr. is a 66 y.o. male past medical history significant for hepatitis C and congential heart murmur is referred to the clinic for metastatic hepatocellular carcinoma.  The patient has not seen his PCP in several years which was at [facility] at [facility]. He states he last saw them around 2018/2019 when he was found to have Hepatitis C. This led to him having his hepatitis C treated with Epclusa with a sustained virologic response and reported cure.   In October 2019 the patient was found to have an indeterminate small lesion measuring 8 mm.  Follow-up ultrasound in July 2020 showed slight interval enlargement to 9 mm.  He subsequently had a dedicated MRI with Eovist contrast performed 12/08/2019 demonstrated 2 small lesions in the periphery of the right hepatic lobe which demonstrated imaging characteristics consistent with LI-RADS Category 5 definitive hepatocellular carcinoma.  The lesions measure 11 and 10 mm respectively.  The patient had successful percutaneous microwave ablation to the 2 lesions on 03/01/2020 by Dr. Laurence Ferrari. He has been following closely with Dr. Laurence Ferrari from interventional radiology for several years since 2021.  He was followed by serial imaging with MRIs.   His MRI from 04/03/21 showed stable ablation defect in the lateral right hepatic lobe, without evidence of recurrent tumor at this site.  5 mm hypovascular lesion in segment 5 of the right lobe was not definitely seen on previous study, but was too small to characterize. A 6 month follow up MRI was recommended at that time.   They patient had a repeat MRI performed on 06/01/2022 that showed suspicious findings in the right hepatic lobe with nodular enhancement adjacent and anterior  superior to the ablation zone.  There was a suspected bony metastatic disease to the lower lumbar spine at L5.  Therefore the patient had an MRI of the lumbar spine on 06/18/2022 that showed L5 vertebral body lesion most consistent with metastasis  There was probable trace ventral epidural extension.    The patient underwent a CT-guided biopsy as well as concurrent microwave ablation to the recurrent liver lesion on 07/17/22.   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  Overall, the patient is feeling well today. He has some intermittent soreness at the biopsy site that he rates a 1-2/10. This started after his biopsy. The pain is triggered by certain movements. He will take a tylenol in the morning which controls his pain.  The patient denies any fever, chills, or night sweats.  His wife mentions that he has a decreased appetite and has not been eating the way he was last 2 weeks or so.  The patient is not interested in seeing a member the nutritionist team at this time.  Denies any nausea, vomiting, diarrhea, or constipation.  Denies any abdominal pain.  The patient denies any chest pain.  He reports he has some increased cough over the last few weeks which he attributes to allergies.  He also mentions after his procedure for his biopsy few weeks ago he had some increased dyspnea with exertion but it has been improving since that time.  Denies any sore throat or sick contacts.  Denies any hemoptysis.  The patient has  a history of migraines for which he will take an over-the-counter extra strength migraine medication which resolves his headaches.  He reports he has been having more headaches recently.  He localizes his headache to the right temporal region.  Denies any balance changes, speech changes, or visual changes.  The patient reports that his  brother has heart problems and kidney disease.  He states that his sister was diagnosed with ovarian cancer.  He has another sister who had lung cancer.   The patient works at [employer] with [employer].  The patient is married.  He has 1 biologic daughter and 2 stepchildren.  He is a former smoker having smoked for approximately 30 years.  He quit 8 years ago.  He estimates he used to smoke half a pack to 1 pack of cigarettes per day and then he started smoking a pack of cigars on a weekly basis.  He used to drink excellently 7 beers per week but he quit drinking alcohol 10 to 15 years ago.  Denies any history of drug use.  HPI  Past Medical History:  Diagnosis Date   Cancer ([place])    Headache    migraines   Hepatitis    HX OF hEP c ?    Murmur, heart 1963    Past Surgical History:  Procedure Laterality Date   IR RADIOLOGIST EVAL & MGMT  01/25/2020   IR RADIOLOGIST EVAL & MGMT  03/21/2020   IR RADIOLOGIST EVAL & MGMT  06/07/2020   IR RADIOLOGIST EVAL & MGMT  09/05/2020   IR RADIOLOGIST EVAL & MGMT  12/05/2020   IR RADIOLOGIST EVAL & MGMT  05/09/2021   IR RADIOLOGIST EVAL & MGMT  09/06/2021   IR RADIOLOGIST EVAL & MGMT  12/12/2021   IR RADIOLOGIST EVAL & MGMT  06/25/2022   IR RADIOLOGIST EVAL & MGMT  07/24/2022   RADIOLOGY WITH ANESTHESIA N/A 03/01/2020   Procedure: CT WITH ANESTHESIA  THERMAL MICROWAVE  ABLATIION;  Surgeon: Jacqulynn Cadet, MD;  Location: [facility];  Service: Anesthesiology;  Laterality: N/A;   RADIOLOGY WITH ANESTHESIA N/A 07/17/2022   Procedure: CT MICROWAVE ABLATION;  Surgeon: Criselda Peaches, MD;  Location: [facility];  Service: Radiology;  Laterality: N/A;    Family History  Problem Relation Age of Onset   Arthritis Mother    Cancer Neg Hx    Diabetes Neg Hx    Drug abuse Neg Hx    Heart disease Neg Hx    Hyperlipidemia Neg Hx    Hypertension Neg Hx    Kidney disease Neg Hx    Stroke Neg Hx     Social History Social History   Tobacco Use   Smoking status: Former    Smokeless tobacco: Never   Tobacco comments:    3 per week  Vaping Use   Vaping Use: Never used  Substance Use Topics   Alcohol use: Not Currently   Drug use: No    No Known Allergies  Current Outpatient Medications  Medication Sig Dispense Refill   prochlorperazine (COMPAZINE) 10 MG tablet Take 1 tablet (10 mg total) by mouth every 6 (six) hours as needed. 30 tablet 2   No current facility-administered medications for this visit.    REVIEW OF SYSTEMS:   Review of Systems  Constitutional: Positive for appetite change. Negative for chills, fatigue, fever and unexpected weight change.  HENT: Negative for mouth sores, nosebleeds, sore throat and trouble swallowing.   Eyes: Negative for eye problems and icterus.  Respiratory: Positive for dry cough. Improving dyspnea on exertion. Negative for hemoptysis and wheezing.   Cardiovascular: Negative for chest pain and leg swelling.  Gastrointestinal: Negative for abdominal pain, constipation, diarrhea, nausea and vomiting.  Genitourinary: Negative for bladder incontinence, difficulty urinating, dysuria, frequency and hematuria.   Musculoskeletal: Positive for back pain near biopsy site and lumbar spine.  Negative for gait problem, neck pain and neck stiffness.  Skin: Negative for itching and rash.  Neurological: Negative for dizziness, extremity weakness, gait problem, headaches, light-headedness and seizures.  Hematological: Negative for adenopathy. Does not bruise/bleed easily.  Psychiatric/Behavioral: Negative for confusion, depression and sleep disturbance. The patient is not nervous/anxious.     PHYSICAL EXAMINATION:  Blood pressure (!) 171/72, pulse 68, temperature 97.9 F (36.6 C), temperature source Temporal, resp. rate 15, height '5\' 7"'$  (1.702 m), weight 135 lb 9.6 oz (61.5 kg), SpO2 97 %.  ECOG PERFORMANCE STATUS: 1  Physical Exam  Constitutional: Oriented to person, place, and time and thin appearing male, and in no  distress. HENT:  Head: Normocephalic and atraumatic.  Mouth/Throat: Oropharynx is clear and moist. No oropharyngeal exudate.  Eyes: Conjunctivae are normal. Right eye exhibits no discharge. Left eye exhibits no discharge. No scleral icterus.  Neck: Normal range of motion. Neck supple.  Cardiovascular: Normal rate, regular rhythm, Heart murmur present and intact distal pulses.   Pulmonary/Chest: Effort normal and breath sounds normal. No respiratory distress. No wheezes. No rales.  Abdominal: Soft. Bowel sounds are normal. Exhibits no distension and no mass. There is no tenderness.  Musculoskeletal: Normal range of motion. Exhibits no edema.  Lymphadenopathy:    No cervical adenopathy.  Neurological: Alert and oriented to person, place, and time. Exhibits normal muscle tone. Gait normal. Coordination normal.  Skin: Skin is warm and dry. No rash noted. Not diaphoretic. No erythema. No pallor.  Psychiatric: Mood, memory and judgment normal.  Vitals reviewed.  LABORATORY DATA: Lab Results  Component Value Date   WBC 4.8 07/17/2022   HGB 13.6 07/17/2022   HCT 41.9 07/17/2022   MCV 98.8 07/17/2022   PLT 180 07/17/2022      Chemistry      Component Value Date/Time   NA 142 07/17/2022 0747   K 4.5 07/17/2022 0747   CL 112 (H) 07/17/2022 0747   CO2 23 07/17/2022 0747   BUN 18 07/17/2022 0747   CREATININE 1.16 07/17/2022 0747   CREATININE 1.07 09/19/2021 1412      Component Value Date/Time   CALCIUM 9.6 07/17/2022 0747   ALKPHOS 70 07/17/2022 0747   AST 19 07/17/2022 0747   ALT 14 07/17/2022 0747   BILITOT 0.4 07/17/2022 0747       RADIOGRAPHIC STUDIES: IR Radiologist Eval & Mgmt  Result Date: 07/24/2022 Please refer to notes tab for details about interventional procedure. (Op Note)  CT GUIDE TISSUE ABLATION  Result Date: 07/17/2022 INDICATION: 66 year old male with hepatocellular carcinoma. He has undergone prior CT-guided ablation in April of 2021 and now has evidence of  recurrent disease between the prior to ablation sites. He presents for repeat ablation. EXAM: CT-guided microwave ablation COMPARISON:  None Available. MEDICATIONS: 2 g Ancef; The antibiotic was administered in an appropriate time interval prior to needle puncture of the skin. ANESTHESIA/SEDATION: General - as administered by the Anesthesia department FLUOROSCOPY: None. COMPLICATIONS: None immediate. TECHNIQUE: Informed written consent was obtained from the patient after a thorough discussion of the procedural risks, benefits and alternatives. All questions were addressed. Maximal Sterile Barrier Technique was utilized  including caps, mask, sterile gowns, sterile gloves, sterile drape, hand hygiene and skin antiseptic. A timeout was performed prior to the initiation of the procedure. A planning axial CT scan was performed in the lesion was identified. Additionally, ultrasound evaluation was performed of the right upper quadrant identifying the expected location of the lesion between the prior ablation sites. Using a combination of ultrasound and CT guidance, a 15 cm PR XT probe was carefully advanced into position. Repeat CT imaging was performed confirming suitable antenna location. Ablation was then performed. The antenna was powered at 65 watts in ablation was performed under real-time ultrasound guidance for a total of 6 minutes. Post ablation CT imaging demonstrates an excellent ablation zone completely encompassing the mass. No evidence of bleeding or other complication. The patient was returned to PACU in good condition. FINDINGS: Successful microwave ablation IMPRESSION: Successful CT-guided microwave ablation of recurrent hepatocellular carcinoma. Signed, Criselda Peaches, MD, [facility] Electronically Signed   By: Jacqulynn Cadet M.D.   On: 07/17/2022 12:49   CT BONE TROCAR/NEEDLE BIOPSY SUPERFICIAL  Result Date:  07/17/2022 INDICATION: 66 year old male with a history of cirrhosis and hepatocellular carcinoma and an enhancing lesion within the L5 vertebral body concerning for possible metastatic disease. He presents for CT-guided deep core biopsy of a bone lesion for further evaluation. EXAM: CT-guided core biopsy deep bone lesion TECHNIQUE: Multidetector CT imaging of the pelvis was performed following the standard protocol without IV contrast. RADIATION DOSE REDUCTION: This exam was performed according to the departmental dose-optimization program which includes automated exposure control, adjustment of the mA and/or kV according to patient size and/or use of iterative reconstruction technique. MEDICATIONS: None. ANESTHESIA/SEDATION: Moderate (conscious) sedation was employed during this procedure. A total of Versed 3 mg and Fentanyl 150 mcg was administered intravenously by the radiology nurse. Total intra-service moderate Sedation Time: 27 minutes. The patient's level of consciousness and vital signs were monitored continuously by radiology nursing throughout the procedure under my direct supervision. COMPLICATIONS: None immediate. PROCEDURE: Informed written consent was obtained from the patient after a thorough discussion of the procedural risks, benefits and alternatives. All questions were addressed. Maximal Sterile Barrier Technique was utilized including caps, mask, sterile gowns, sterile gloves, sterile drape, hand hygiene and skin antiseptic. A timeout was performed prior to the initiation of the procedure. Initial CT imaging identifies the subtle lesion in the posterior aspect of the L5 vertebral body. A suitable skin entry site was selected and marked. The skin was sterilely prepped and draped in the standard fashion with chlorhexidine skin prep. Local anesthesia was attained by infiltration with 1% lidocaine. A small dermatotomy was made. Under intermittent CT guidance, an 11 gauge introducer needle was  carefully advanced and positioned at the right-sided pedicle of L5. The introducer was then carefully guided from a superior to inferior tract along the pedicle and just into the vertebral body such that the introducer was directed toward the lesion of interest. Smaller core bone biopsies were then obtained coaxially using the OnControl drill device. Biopsy specimens were placed in formalin and delivered to pathology for further analysis. Following biopsy, the outer trocar was removed. Follow-up CT imaging demonstrates no evidence of immediate complication. IMPRESSION: Successful CT-guided core biopsy of L5 bone lesion. Electronically Signed   By: Jacqulynn Cadet M.D.   On: 07/17/2022 12:44   DG Chest 1 View  Result Date: 07/17/2022 CLINICAL DATA:  Preop for liver ablation EXAM: CHEST  1 VIEW COMPARISON:  03/01/2020  FINDINGS: Normal mediastinum and cardiac silhouette. Normal pulmonary vasculature. No evidence of effusion, infiltrate, or pneumothorax. No acute bony abnormality. IMPRESSION: No acute cardiopulmonary process. Electronically Signed   By: Suzy Bouchard M.D.   On: 07/17/2022 09:18    ASSESSMENT: This is a very pleasant 66 year old African-American male with:  Metastatic hepatocellular carcinoma, initially diagnosed in 2021 -The patient has been followed by Dr. Laurence Ferrari from interventional radiology. -MRI from 12/08/2019 showed 2 small lesions in the periphery of the right hepatic lobe which showed LI-RADS category 5 definitive hepatocellular carcinoma.  The lesions measured 11 and 10 mm respectively. -Percutaneous microwave ablation of the 2 lesions on 03/01/2020 -He had been on observation with surveillance imaging.  -MRI 06/01/2022 showed equivocal but suspicious findings at the previously treated site in the right hepatic lobe with nodular enhancement adjacent and anterior/superior to the ablation zone.  There is also suspected bony metastatic lesion to the lower lumbar spine at L5  which was further characterized by lumbar MRI. -CT-guided biopsy of the metastatic bone lesion showed that the pathology is was consistent with metastatic disease from his [place].  He also underwent concurrent microwave ablation to the recurrent liver lesion on 07/17/2022. -Given the metastatic lesion, he was referred to medical oncology. -The patient was seen with Dr. Burr Medico today.  Dr. Burr Medico had a lengthy discussion with the patient today about his current condition and recommended treatment options.  Dr. Burr Medico discussed that given the metastatic lesion this now makes him stage IV disease which is treatable but not curable.  Dr. Burr Medico discussed prognosis with him. -Given the fact that he has metastatic disease to L5, it is likely that he has microscopic disease which will need systemic treatment. -The patient has never had whole-body imaging for staging.  Dr. Burr Medico recommends a PET scan.  I have placed an order -Dr. Burr Medico discussed the role of radiation would only provide local treatment to L5.  This can certainly be considered though since the patient does not have other sites of disease at this time.  However Dr. Burr Medico favors systemic treatment which the options include IV or p.o. treatment -The p.o. options include Lenvima 12 mg p.o. daily versus IV immunotherapy.  Dr. Burr Medico discussed the adverse side effects of immunotherapy including but not limited to immunotherapy mediated skin rash, diarrhea, inflammation of the lung, kidney, liver, thyroid or other endocrine dysfunction.  She also discussed the adverse side effects of Lenvima including GI upset and fatigue. -Patient weighs 61 kg.  The dose options are based on her weight.  The patient is on the border.  Dr. Burr Medico is going to give him the standard dose of 12 mg but if he has intolerance to that she can always decrease it to the 8 mg dosing. -The patient is still working full-time in prefers to limit the number of the appointments.  Therefore he will proceed on  treatment with Lenvima.  We will arrange for him to meet with the oral chemotherapy pharmacist today to discuss adverse side effects and education on this treatment. -We expect him to start his first dose of treatment in the next 1 to 2 weeks. -We will see him back for follow-up visit in 1 month for evaluation and repeat blood work and to manage any adverse side effects of treatment. -I will call the patient once I have the results of his PET scan.  The patient does not like to miss work.  I discussed that we would always be happy to write  him a work excuse for his appointments and required imaging appointments.    2. Hepatitis C, migraines, decreased appetite, congenital heart defect/murmurs -hepatitis C now status post 12-week treatment with Epclusa with a sustained virologic response and cure in 2019. --The patient is still working full-time which is why he has not followed up routinely with his other medical providers. -Has not seen his PCP in several years. Previously seen at [facility] at [facility].  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 I strongly encouraged him to follow-up with his PCP to ensure that he is up-to-date on his other health screenings and health maintenance.  Discussed it is important for them to follow-up on his congenital heart defect for close monitoring.  -He reports he has a history of migraines and has increased migraines over the last 2 weeks.  Discussed with Dr. Burr Medico and is unlikely that this is secondary to his malignancy.  The patient reports that his headaches are controlled with over-the-counter extra strength migraine medication.  He also states his migraines are the same characteristics as his prior history  I encouraged him to discuss it with his family provider. -Reports decreased appetite.  Offered referral to member the nutritionist team which he declined at this time.  He is aware  of this service if desired.  3. Social -Socially the patient lives in a [place] house with his wife and daughter. -The patient is still working full-time which is why he reports he is not followed up routinely with his other medical providers. -Discussed radiology to be happy to write a work excuse note for him to come to his appointments and skin appointments as recommended.   PLAN: -Arrange PET scan for staging since the patient has not had any full body staging imaging -Sent prescription for Compazine and Zofran  -Meet with oral chemotherapy pharmacist today -F/U in 1 month with labs.    The patient voices understanding of current disease status and treatment options and is in agreement with the current care plan.  All questions were answered. The patient knows to call the clinic with any problems, questions or concerns. We can certainly see the patient much sooner if necessary.  Thank you so much for allowing me to participate in the care of Corey [facility].. I will continue to follow up the patient with you and assist in his care.   Disclaimer: This note was dictated with voice recognition software. Similar sounding words can inadvertently be transcribed and may not be corrected upon review.   Reanna Scoggin L Radhika Dershem August 02, 2022, 1:11 PM  Addendum  I have seen the patient, examined him. I agree with the assessment and and plan and have edited the notes.   66 yo male with history of liver cirrhosis from hepatitis C, Child-Pugh score class A, HCC which was diagnosed in early 2021, status post ablation by Dr. Laurence Ferrari.  On recent repeated MRI, he has developed a small adjacent liver lesion which was ablated again, and a bone lesion at L5 which was confirmed as liver cancer metastasis by biopsy.  Asymptomatic.  I recommend completing staging with PET scan, to rule out additional metastatic disease.  His indolent course, and limited metastasis, I recommend TKI  Lenvatinib as first line therapy due to the convenience, I also reviewed IV therapy with atezolizumab and bevacizumab, which I plan to give if he progressed on Leventa therapy.  I  reviewed the potential side effect with him in great detail, he agrees to proceed. Will give him '8mg'$  daily for first month given his weight 61kg only. If he tolerates well, will increase to '12mg'$  daily from second month.  I will call in to [facility], plan to start as soon as he receives it. All questions were answered.   Truitt Merle MD  08/02/2022

## 2022-08-02 ENCOUNTER — Telehealth: Payer: Self-pay | Admitting: Pharmacist

## 2022-08-02 ENCOUNTER — Telehealth: Payer: Self-pay | Admitting: Pharmacy Technician

## 2022-08-02 ENCOUNTER — Encounter: Payer: Self-pay | Admitting: Physician Assistant

## 2022-08-02 ENCOUNTER — Other Ambulatory Visit: Payer: Self-pay

## 2022-08-02 ENCOUNTER — Other Ambulatory Visit (HOSPITAL_COMMUNITY): Payer: Self-pay

## 2022-08-02 ENCOUNTER — Inpatient Hospital Stay: Payer: Commercial Managed Care - PPO | Attending: Physician Assistant | Admitting: Physician Assistant

## 2022-08-02 VITALS — BP 171/72 | HR 68 | Temp 97.9°F | Resp 15 | Ht 67.0 in | Wt 135.6 lb

## 2022-08-02 DIAGNOSIS — R011 Cardiac murmur, unspecified: Secondary | ICD-10-CM | POA: Insufficient documentation

## 2022-08-02 DIAGNOSIS — C7951 Secondary malignant neoplasm of bone: Secondary | ICD-10-CM | POA: Insufficient documentation

## 2022-08-02 DIAGNOSIS — Z7189 Other specified counseling: Secondary | ICD-10-CM | POA: Diagnosis not present

## 2022-08-02 DIAGNOSIS — Z87891 Personal history of nicotine dependence: Secondary | ICD-10-CM | POA: Diagnosis not present

## 2022-08-02 DIAGNOSIS — C220 Liver cell carcinoma: Secondary | ICD-10-CM

## 2022-08-02 DIAGNOSIS — Z0000 Encounter for general adult medical examination without abnormal findings: Secondary | ICD-10-CM | POA: Insufficient documentation

## 2022-08-02 MED ORDER — ONDANSETRON HCL 8 MG PO TABS: 8.0000 mg | ORAL_TABLET | Freq: Three times a day (TID) | ORAL | 2 refills | Status: DC | PRN

## 2022-08-02 MED ORDER — PROCHLORPERAZINE MALEATE 10 MG PO TABS: 10.0000 mg | ORAL_TABLET | Freq: Four times a day (QID) | ORAL | 2 refills | Status: DC | PRN

## 2022-08-02 NOTE — Telephone Encounter (Signed)
Oral Oncology Patient Advocate Encounter  Prior Authorization for [facility]y [facility] has been approved.    PA# [id number] Effective dates: 08/02/2022 through 08/03/2023  Patient must fill at [facility].    [facility]y [facility], CPhT-Adv Oncology Pharmacy Patient [place] Direct Number: [phone]  Fax: [phone]

## 2022-08-02 NOTE — Telephone Encounter (Addendum)
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   Counseled patient on administration, dosing, side effects, monitoring, drug-food interactions, safe handling, storage, and disposal.  CMP and CBC w/ Diff from 07/17/22 assessed, no relevant lab abnormalities requiring baseline dose adjustment required at this time. Noted pt with elevated BP of 171/72 mmHg - discussed patient monitoring this at home as able due to risk up hypertension when on Lenvima. Patient weight of 61.5 kg on 08/02/22 - per MD planning to start on dose reduction of 8 mg daily, and then increase to goal dose of 12 mg daily if first cycle tolerated.  Patient will take Lenvima $RemoveBefo'4mg'kyQYoeivkGf$  capsules, 2 capsules ($RemoveBef'8mg'XfaKtqhnHn$  total) by mouth once daily, with or without food, at approximately the same time each day.  Lenvima start date: 08/16/22  Adverse effects include but are not limited to: hypertension, hand-foot syndrome, diarrhea, joint pain, fatigue, headache, decreased calcium, proteinuria, increased risk of blood clots, and cardiac conduction issues.   Patient will obtain anti diarrheal and alert the office of 4 or more loose stools above baseline. Patient has antiemetic on hand and knows to use this as needed for nausea while on Lenvima. Patient instructed to notify office of any upcoming invasive procedures.  Michel Santee will be held for 6 days prior to scheduled surgery, restart based on healing and clinical judgement.   Reviewed with patient importance of keeping a medication schedule and plan for any missed doses. No barriers to medication adherence identified.  Medication reconciliation performed and medication/allergy list updated. Current medication list in Epic reviewed, no relevant/significant DDIs with Lenvima identified.  All questions  answered.  Patient agreement for treatment documented in MD note on 08/02/22.  Mr. Mabey voiced understanding and appreciation.   Medication education handout given to patient. Patient knows to call the office with questions or concerns. Oral Chemotherapy Clinic phone number provided to patient. Oral Oncology Clinic will continue to follow for insurance authorization, copayment issues, initial counseling and start date.  Leron Croak, PharmD, BCPS, [facility] Hematology/Oncology Clinical Pharmacist [facility] and [place] [phone] 08/02/2022 2:31 PM

## 2022-08-02 NOTE — Progress Notes (Signed)
I met with Mr Becvar and his wife after  his consultation with Cassie Heilingoetter, PA-C and Dr Burr Medico.  I explained my role as a nurse navigator and provided my contact information. I explained the services provided at [facility] and provided written information.  I explained the alight grant and let  him know one of the financial advisors will reach out to  him.  All questions were answered. He verbalized understanding.

## 2022-08-02 NOTE — Telephone Encounter (Addendum)
Oral Oncology Patient Advocate Encounter   Received notification that prior authorization for Lenvima is required.   PA submitted on 08/02/2022 ID: [id number] Status is pending     Corey Armstrong, CPhT-Adv Oncology Pharmacy Patient [place] Direct Number: [phone]  Fax: [phone]

## 2022-08-02 NOTE — Patient Instructions (Addendum)
-It was nice meeting you today.  I know we discussed a lot of important information.  Here is a summary of the main points moving forward. -Dr. Laurence Ferrari provides local treatment just to the live.  Because the liver cancer has now spread outside of the liver to the spot in the low back/spine, there likely is other microscopic disease that we cannot see at this time.  The concerned with microscopic disease is that it can spread to other parts of the body and cause symptoms if it grows and can decrease life expectancy.  Therefore, you were referred to see Dr. Burr Medico today who is a medical oncologist.  Dr. Burr Medico uses medicines to treat cancer in the whole body. --Since the area from the liver has now spread outside of the liver, and now makes this stage IV otherwise called metastatic disease.  This is certainly treatable but not curable.  The goal of treatment would be to prolong your life and try to treat microscopic disease to delay it from growing and causing pain or symptoms somewhere else in the body.  Overall, it appears that your cancer is fairly slow-growing.  Average prognosis for metastatic liver cancer is 1 to 2 years, although Dr. Burr Medico thinks that your disease seems to be behaving less aggressive.  --In order to treat the microscopic disease, you need treatment that can go everywhere.  Your options include a drug that you take as a pill once daily or an IV medication once every 3 weeks.  Wells Guiles, our pharmacist,  who will review how to take your medications and side effects in more detail. Of course if you are not able to tolerate this, Dr. Burr Medico could reduce the dose or you can switch to the IV treatment in the future. -You have never had a full body scan.  Because we know of the spot in the low back, we want to make sure were not missing anything or other areas where the cancer may have spread.  Therefore, I have ordered a PET scan.  A PET scan is a scan from head to thigh.  We want to ensure that we are  not seeing any areas where the cancer has spread.  This will include the lungs.  Someone from the radiology department will call you to get this scheduled.  Please schedule this at your earliest convenience. If you ever need a work note so you can get imaging or come to appointments, please let us know. Please be on the look out for phone call from the radiology department.  If you need to reach them, their number is [phone]. -I would strongly encourage you to see a primary care provider. It is important that they are monitoring you and all your other health conditions and to be making sure you are up-to-date on your routine blood work and health screenings.  I know on exam today you had a heart murmur.  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  Sometimes heart murmurs and heart issues can affect your lungs and breathing.  It may be a good idea for them to be monitoring this closely.  -Please let me know if you ever decide that you want to see a member the nutritionist team -We are hopeful you will obtain your medication in the next 1 to 2 weeks.  Therefore, we will plan  on seeing you back for lab work and a follow-up visit in 1 month to ensure that the medication is going okay. -We will call you with the PET scan results.

## 2022-08-03 ENCOUNTER — Encounter: Payer: Self-pay | Admitting: Physician Assistant

## 2022-08-03 MED ORDER — LENVATINIB (8 MG DAILY DOSE) 2 X 4 MG PO CPPK
8.0000 mg | ORAL_CAPSULE | Freq: Every day | ORAL | 0 refills | Status: DC
  Filled 2022-08-03: qty 60, 30d supply, fill #0

## 2022-08-05 ENCOUNTER — Other Ambulatory Visit (HOSPITAL_COMMUNITY): Payer: Self-pay

## 2022-08-05 MED ORDER — LENVATINIB (8 MG DAILY DOSE) 2 X 4 MG PO CPPK: 8.0000 mg | ORAL_CAPSULE | Freq: Every day | ORAL | 0 refills | Status: DC

## 2022-08-07 NOTE — Telephone Encounter (Signed)
Oral Chemotherapy Pharmacist Encounter   Called to check on status of Lenvima through [facility]. Per representative, medication is ready to be set up for shipment and they will be reaching out to patient today for this. Will follow up with patient/patient's wife this week to confirm medication has been scheduled and determine start date.  Leron Croak, PharmD, BCPS, BCOP Hematology/Oncology Clinical Pharmacist [facility] and [place] [phone] 08/07/2022 10:20 AM

## 2022-08-09 NOTE — Telephone Encounter (Signed)
Oral Chemotherapy Pharmacist Encounter   Notified by [place] that [facility] prescription had been set up for shipment and will [facility]. Patient will start therapy on 08/16/22.  Leron Croak, PharmD, BCPS, [facility] Hematology/Oncology Clinical Pharmacist [facility] and [place] [phone] 08/09/2022 10:54 AM

## 2022-08-12 ENCOUNTER — Telehealth: Payer: Self-pay

## 2022-08-12 ENCOUNTER — Other Ambulatory Visit: Payer: Self-pay

## 2022-08-12 NOTE — Telephone Encounter (Signed)
Pt called stating his Michel Santee will be arriving on 08/15/2022.  Pt wanted to let Leron Croak know of his delivery date.  Notified Wells Guiles of the pt's message.

## 2022-08-16 ENCOUNTER — Telehealth: Payer: Self-pay | Admitting: Pharmacist

## 2022-08-16 NOTE — Telephone Encounter (Signed)
Oral Chemotherapy Pharmacist Encounter   Called patient to follow up that he had picked up his Lenvima (lenvatinib) [facility] as [facility] was being [facility] to [facility] on that day.  No answer. Left voicemail for patient to call back to confirm the above.   Leron Croak, PharmD, BCPS, BCOP Hematology/Oncology Clinical Pharmacist [facility] and [place] [phone] 08/16/2022 11:15 AM

## 2022-08-19 NOTE — Telephone Encounter (Signed)
Oral Chemotherapy Pharmacist Encounter   Attempted to reach patient on 08/19/22 - no answer, phone went to voicemail.  Was able to reach patient's wife, who confirmed patient started Lenvima on 08/24/22. Per patient's wife, no issues with medication so far.   Patient knows to call the office with questions or concerns.  Leron Croak, PharmD, BCPS, [employer] Hematology/Oncology Clinical Pharmacist [facility] and [place] [phone] 08/19/2022 3:38 PM

## 2022-08-23 ENCOUNTER — Other Ambulatory Visit: Payer: Self-pay

## 2022-08-23 ENCOUNTER — Encounter (HOSPITAL_COMMUNITY): Payer: Self-pay

## 2022-08-23 ENCOUNTER — Telehealth: Payer: Self-pay

## 2022-08-23 ENCOUNTER — Other Ambulatory Visit: Payer: Self-pay | Admitting: Hematology

## 2022-08-23 ENCOUNTER — Telehealth: Payer: Self-pay | Admitting: Internal Medicine

## 2022-08-23 ENCOUNTER — Encounter (HOSPITAL_COMMUNITY): Payer: Commercial Managed Care - PPO

## 2022-08-23 DIAGNOSIS — C220 Liver cell carcinoma: Secondary | ICD-10-CM

## 2022-08-23 NOTE — Telephone Encounter (Signed)
Lamira from [place] called to update [place] on pt's bp readings, pts readings was 171/75. Call back # [phone], ext [phone].

## 2022-08-23 NOTE — Telephone Encounter (Signed)
Home Health Nurse called stating that the pt's BP is 171/75 today.  Pt denies pain and the nurse did not report a HR.  Home Health Nurse stated that the pt was unsure as to who he's supposed to report his elevated BP to so the Home Health Nurse contact Dr. Ernestina Penna office since the pt thinks the elevated BP is related to the [facility].  Notified Dr. Burr Medico and Leron Croak, [facility] of the pt's call.

## 2022-08-26 ENCOUNTER — Telehealth: Payer: Self-pay

## 2022-08-26 ENCOUNTER — Other Ambulatory Visit: Payer: Self-pay | Admitting: Physician Assistant

## 2022-08-26 DIAGNOSIS — C220 Liver cell carcinoma: Secondary | ICD-10-CM

## 2022-08-26 NOTE — Telephone Encounter (Signed)
Spoke with pt's spouse which is who the pt would like to be contacted during the day d/t pt cannot answer the phone while at work.  Asked pt's spouse if the pt has been monitoring his BP's.  Pt's spouse stated that "Yes" but she's not sure of what they are currently running d/t pt is at work and cannot be reached.  Informed pt's spouse that the Lenvima that the pt is currently taking is most likely causing the elevation in his BP.  Pt's spouse stated she would call this RN back tomorrow 08/27/2022 with the results of pt's BP from over the weekend.  If pt's BP's remained elevated over the weekend, Dr. Burr Medico would like the pt to be seen earlier if possible to possibly prescribe BP medication.

## 2022-08-26 NOTE — Telephone Encounter (Signed)
Spoke to pt. He said he checks it at home.

## 2022-08-26 NOTE — Telephone Encounter (Signed)
This pt has not seen Dr Silvio Pate yet. He has not been seen in the office in over 4 years. The phone number provided does not go to a health nurse. It asks for the last 4 digits of the pt's SS#. We now liable for this pt because the information was left for Dr Silvio Pate. I will forward this to triage to check with pt.

## 2022-08-26 NOTE — Telephone Encounter (Signed)
I spoke with pts wife (DPR signed)  and she said pt is not on home health; Mrs Kops said that [place] is through where pt works and pts wife said not to call them people back. Pts wife has already spoken with Dr Ernestina Penna office who is taking care of pts BP at this time. Pts wife said they do plan to establish with Dr Silvio Pate on 09/12/22.sending note to Dr Silvio Pate and Larene Beach CMA.

## 2022-08-27 ENCOUNTER — Telehealth: Payer: Self-pay

## 2022-08-27 NOTE — Telephone Encounter (Signed)
Pt's spouse contacted Dr. Ernestina Penna office this morning with the pt's BP's since 08/15/2022 when he started taking the [place].  Unfortunately, she did not have the HR but was able to give me the BP's.  Notified Cira Rue, NP, Cassie Heilingoetter, PA-C, and Leron Croak [facility] of his BP's.  Pt's BP's are as follows.  08/15/2022 -127/63 08/16/2022 - 138/70 08/17/2022 - 145/84 08/18/2022 - 147/79 08/19/2022 - 177/82 08/20/2022 - 171/75 08/21/2022 - 142/78 08/22/2022 - 151/75 08/23/2022 -159/77 08/24/2022 - 155/83 08/25/2022 - 148/72  Spoke with pt's wife about the BP's.  Pt will be seen on 09/02/2022 by Cassie H, PA-C at which time she will address the pt's elevated systolic pressures.

## 2022-08-30 NOTE — Progress Notes (Unsigned)
[facility] OFFICE PROGRESS NOTE  Jearld Fenton, NP [place] [address]  DIAGNOSIS: Hepatocellular carcinoma   Oncology History  Hepatocellular carcinoma ([place])  12/08/2019 Imaging   MR ABDOMEN WWO CONTRAST   IMPRESSION: Two small masses in the right hepatic lobe which have characteristics diagnostic for hepatocellular carcinoma in the setting of cirrhosis. (LI-RADS Category 5: Definitely HCC)   No evidence of abdominal metastatic disease or other significant abnormality.   03/01/2020 Initial Diagnosis   Hepatocellular carcinoma ([place])   03/01/2020 Procedure   CT GUIDE TISSUE ABLATION     IMPRESSION: Successful percutaneous thermal ablation.   Signed,   Criselda Peaches, MD, RPVI   06/02/2020 Imaging   MR ABDOMEN WWO CONTRAST   IMPRESSION: 1. There has been interval ablation at two adjacent sites of the subcapsular right lobe of the liver, hepatic segment VI. There is no residual contrast enhancement at these adjacent sites. LI-RADS category 5 TR, nonviable. 2. There are scattered, subcentimeter foci of arterial hyperenhancement of the anterior right lobe of the liver, for example adjacent to the ablation site measuring 3 mm and in the medial anterior right lobe of the liver measuring 6 mm. This larger focus is unchanged compared to prior. Others were not previously seen. These findings are nonspecific and consistent with LI-RADS category 3. Attention on follow-up.   09/02/2020 Imaging   MR ABDOMEN WWO CONTRAST     IMPRESSION: 1. Post radiofrequency ablation along the margin of the RIGHT hemi liver. Heterogeneous appearance of this area on both T1 and T2. No sign of residual enhancement. LR TR nonviable. 2. Very subtle focus of restricted diffusion in the cephalad lateral segment LEFT hepatic lobe measuring 7 mm, corresponding to low signal on image 27 of series 21. Arterial enhancement is not seen in this area and this areas not  seen on prior imaging. Not clear whether this represents a new lesion as this area is obscured largely by artifact on most sequences on today's study and on previous imaging evaluations. LR category 3, consider a 3 to six-month follow-up with abdominal MRI. 3. Lesions of concern scattered about the RIGHT hepatic lobe may represent perfusional anomalies as they were not seen on today's study. 4. Stable mild dilation of the biliary tree.   11/27/2020 Imaging   MR ABDOMEN WWO CONTRAST   IMPRESSION: Minimal decrease in size of ablation defect in the peripheral anterior right hepatic lobe. No evidence of locally recurrent hepatocellular carcinoma or other suspicious liver lesions.   No evidence of metastatic disease or other acute findings.   04/03/2021 Imaging   MR ABDOMEN WWO CONTRAST   IMPRESSION: Stable ablation defect in the lateral right hepatic lobe, without evidence of recurrent tumor at this site.   5 mm hypovascular lesion in segment 5 of the right lobe was not definitely seen on previous study, but is too small to characterize. This shows no arterial phase hyperenhancement. Recommend continued follow-up by MRI in 6 months.   No evidence of abdominal metastatic disease.        09/03/2021 Imaging   MR ABDOMEN WWO CONTRAST   IMPRESSION: 1. Side-by-side ablation sites in the right hepatic lobe in segment 6. The more anterior site remains largely cystic in appearance and no findings suspicious recurrent tumor in this area. There is however abnormal contrast enhancement around the larger more posterior and lateral ablation site worrisome for recurrent tumor. 2. Several small foci of early arterial phase enhancement in the liver.  The peripheral lesions are likely vascular shunts. The more central lesions are likely dysplastic nodules. Recommend continued surveillance. 3. Stable bilateral renal cysts.   12/07/2021 Imaging   MR ABDOMEN WWO CONTRAST  IMPRESSION: 1.  Ablation defects in the right lobe of the liver, similar to the prior study, without definitive evidence to suggest local recurrence of disease or metastatic disease. 2. Multiple tiny arterial phase areas of nodular hyperenhancement scattered throughout the liver, predominantly in the right lobe of the liver, without perceptible signal abnormality or diffusion restriction on other pulse sequences, nonspecific, and likely small benign areas of vascular shunting. Continued attention on follow-up imaging is recommended. 3. Multiple Bosniak class 1 and Bosniak class 2 cysts in the kidneys bilaterally, similar to the prior study, as above. 4. Aortic atherosclerosis.     06/01/2022 Imaging   MR ABDOMEN WWO CONTRAST   IMPRESSION: 1. LR TR equivocal but suspicious findings with respect to previously treated disease in the RIGHT hepatic lobe given nodular enhancement adjacent and anterior superior to the ablation zone as described. 2. Given lack of late arterial phase due to bolus timing a second area of abnormality within the liver is suspicious for additional site of hepatocellular carcinoma showing washout appearance and capsule appearance on current imaging, cardiac pulsation limiting assessment in the area of concern. 3. Suspect bony metastatic disease in the lower lumbar spine at L5 level. Dedicated spinal imaging may be helpful for further evaluation.     06/18/2022 Imaging   MR Lumbar Spine W Wo Contrast   IMPRESSION: L5 vertebral body lesion is most consistent with a metastasis. There is probable trace ventral epidural extension   07/17/2022 Procedure   CT GUIDE TISSUE ABLATION   IMPRESSION: Successful CT-guided microwave ablation of recurrent hepatocellular carcinoma.   07/17/2022 Procedure   CT BONE TROCAR/NEEDLE BIOPSY SUPERFICIAL   IMPRESSION: Successful CT-guided core biopsy of L5 bone lesion.      07/17/2022 Pathology Results   SURGICAL PATHOLOGY  CASE:  [id number]  PATIENT: DURRELL BARAJAS  Surgical Pathology Report   SURGICAL PATHOLOGY  CASE: [id number]  PATIENT: Thompson Grayer  Surgical Pathology Report      Clinical History: Hx of cirrhosis and small HCC with unexpected  suspicious lesion at L5. Rare [place] met to bone vs atypical hemangioma or  met from new primary. (crm)      FINAL MICROSCOPIC DIAGNOSIS:   A. 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 66 y.o. male returns to clinic today for follow-up visit.  The patient establish care in the clinic on 08/02/2022.  He was recently found to have metastatic disease from hepatocellular carcinoma to L5.  He was supposed to have a PET scan to complete the staging work-up but there was some delays in insurance authorization.  Patient started on treatment with Lenvima.  He started this on ***and is tolerating this well without any concerning adverse side effects except he has noticed an increase in his blood pressure.  He is asymptomatic and denies any headaches, vision changes, chest x-ray, or changes in urination.  He denies any fever, chills, night sweats, or unexplained weight loss.  Fatigue?  Joint pain?  He denies any headaches.  Denies any diarrhea.  Denies any chest pain, shortness of breath, cough, or hemoptysis.  Denies any jaundice or  itching.  Back pain? The pain is triggered by certain movements. He will take a tylenol in the morning which controls his pain.  He is here today for evaluation and for a follow-up visit to manage any adverse side effects of treatment.     MEDICAL HISTORY: Past Medical History:  Diagnosis Date   Cancer ([place])    Headache    migraines   Hepatitis    HX OF hEP c ?    Murmur, heart 1963    ALLERGIES:  has No Known  Allergies.  MEDICATIONS:  Current Outpatient Medications  Medication Sig Dispense Refill   LENVIMA, 8 MG DAILY DOSE, 2 x 4 MG capsule TAKE 8 MG (2 X 4 MG CAPSULES) BY MOUTH 1 TIME A DAY. 60 each 0   ondansetron (ZOFRAN) 8 MG tablet Take 1 tablet (8 mg total) by mouth every 8 (eight) hours as needed for nausea or vomiting. 30 tablet 2   prochlorperazine (COMPAZINE) 10 MG tablet Take 1 tablet (10 mg total) by mouth every 6 (six) hours as needed. 30 tablet 2   No current facility-administered medications for this visit.    SURGICAL HISTORY:  Past Surgical History:  Procedure Laterality Date   IR RADIOLOGIST EVAL & MGMT  01/25/2020   IR RADIOLOGIST EVAL & MGMT  03/21/2020   IR RADIOLOGIST EVAL & MGMT  06/07/2020   IR RADIOLOGIST EVAL & MGMT  09/05/2020   IR RADIOLOGIST EVAL & MGMT  12/05/2020   IR RADIOLOGIST EVAL & MGMT  05/09/2021   IR RADIOLOGIST EVAL & MGMT  09/06/2021   IR RADIOLOGIST EVAL & MGMT  12/12/2021   IR RADIOLOGIST EVAL & MGMT  06/25/2022   IR RADIOLOGIST EVAL & MGMT  07/24/2022   RADIOLOGY WITH ANESTHESIA N/A 03/01/2020   Procedure: CT WITH ANESTHESIA  THERMAL MICROWAVE  ABLATIION;  Surgeon: Jacqulynn Cadet, MD;  Location: [facility];  Service: Anesthesiology;  Laterality: N/A;   RADIOLOGY WITH ANESTHESIA N/A 07/17/2022   Procedure: CT MICROWAVE ABLATION;  Surgeon: Criselda Peaches, MD;  Location: [facility];  Service: Radiology;  Laterality: N/A;    REVIEW OF SYSTEMS:   Review of Systems  Constitutional: Negative for appetite change, chills, fatigue, fever and unexpected weight change.  HENT:   Negative for mouth sores, nosebleeds, sore throat and trouble swallowing.   Eyes: Negative for eye problems and icterus.  Respiratory: Negative for cough, hemoptysis, shortness of breath and wheezing.   Cardiovascular: Negative for chest pain and leg swelling.  Gastrointestinal: Negative for abdominal pain, constipation, diarrhea, nausea and vomiting.  Genitourinary: Negative for bladder  incontinence, difficulty urinating, dysuria, frequency and hematuria.   Musculoskeletal: Negative for back pain, gait problem, neck pain and neck stiffness.  Skin: Negative for itching and rash.  Neurological: Negative for dizziness, extremity weakness, gait problem, headaches, light-headedness and seizures.  Hematological: Negative for adenopathy. Does not bruise/bleed easily.  Psychiatric/Behavioral: Negative for confusion, depression and sleep disturbance. The patient is not nervous/anxious.     PHYSICAL EXAMINATION:  There were no vitals taken for this visit.  ECOG PERFORMANCE STATUS: {CHL ONC ECOG [id number]  Physical Exam  Constitutional: Oriented to person, place, and time and well-developed, well-nourished, and in no distress. No distress.  HENT:  Head: Normocephalic and atraumatic.  Mouth/Throat: Oropharynx is clear and moist. No oropharyngeal exudate.  Eyes: Conjunctivae are normal. Right eye exhibits no discharge. Left eye exhibits no discharge. No scleral icterus.  Neck: Normal range of motion. Neck supple.  Cardiovascular: Normal rate, regular  rhythm, normal heart sounds and intact distal pulses.   Pulmonary/Chest: Effort normal and breath sounds normal. No respiratory distress. No wheezes. No rales.  Abdominal: Soft. Bowel sounds are normal. Exhibits no distension and no mass. There is no tenderness.  Musculoskeletal: Normal range of motion. Exhibits no edema.  Lymphadenopathy:    No cervical adenopathy.  Neurological: Alert and oriented to person, place, and time. Exhibits normal muscle tone. Gait normal. Coordination normal.  Skin: Skin is warm and dry. No rash noted. Not diaphoretic. No erythema. No pallor.  Psychiatric: Mood, memory and judgment normal.  Vitals reviewed.  LABORATORY DATA: Lab Results  Component Value Date   WBC 4.8 07/17/2022   HGB 13.6 07/17/2022   HCT 41.9 07/17/2022   MCV 98.8 07/17/2022   PLT 180 07/17/2022      Chemistry       Component Value Date/Time   NA 142 07/17/2022 0747   K 4.5 07/17/2022 0747   CL 112 (H) 07/17/2022 0747   CO2 23 07/17/2022 0747   BUN 18 07/17/2022 0747   CREATININE 1.16 07/17/2022 0747   CREATININE 1.07 09/19/2021 1412      Component Value Date/Time   CALCIUM 9.6 07/17/2022 0747   ALKPHOS 70 07/17/2022 0747   AST 19 07/17/2022 0747   ALT 14 07/17/2022 0747   BILITOT 0.4 07/17/2022 0747       RADIOGRAPHIC STUDIES:  No results found.   ASSESSMENT/PLAN:  This is a very pleasant 66 year old African-American male with:   Metastatic hepatocellular carcinoma, initially diagnosed in 2021 -The patient has been followed by Dr. Laurence Ferrari from interventional radiology. -MRI from 12/08/2019 showed 2 small lesions in the periphery of the right hepatic lobe which showed LI-RADS category 5 definitive hepatocellular carcinoma.  The lesions measured 11 and 10 mm respectively. -Percutaneous microwave ablation of the 2 lesions on 03/01/2020 -He had been on observation with surveillance imaging.  -MRI 06/01/2022 showed equivocal but suspicious findings at the previously treated site in the right hepatic lobe with nodular enhancement adjacent and anterior/superior to the ablation zone.  There is also suspected bony metastatic lesion to the lower lumbar spine at L5 which was further characterized by lumbar MRI. -CT-guided biopsy of the metastatic bone lesion showed that the pathology is was consistent with metastatic disease from his [place].  He also underwent concurrent microwave ablation to the recurrent liver lesion on 07/17/2022. -The patient has never had whole-body imaging for staging.  Dr. Burr Medico recommends a PET scan.  there is some delays in insurance approval ***P2P last week. Will order CT CAP with bone scan instead.  -Patient weighs 61 kg.***  The dose options are based on her weight.  The patient is on the border.  Dr. Burr Medico is going to give him the standard dose of 12 mg but if he has  intolerance to that she can always decrease it to the 8 mg dosing. -The patient is still working full-time in prefers to limit the number of the appointments.  Therefore, he started on treatment with Lenvima. His first dose was on ***. He is tolerating ***.  -We will see him back for follow-up visit in 1 month for evaluation and repeat blood work and to manage any adverse side effects of treatment.  2. Hypertension The patient started developing some degree of HTN with treatment, which is a known side effect.  -Systolic BP most often 735-329'J, sometimes 170's.  -Will start on norvasc 5 mg p.o. daily. The patient does not like to  miss work and this will allow less appointments than [facility] or [facility] to monitor creatinine or electrolytes.      2. Hepatitis C, migraines, decreased appetite, congenital heart defect/murmurs -hepatitis C now status post 12-week treatment with Epclusa with a sustained virologic response and cure in 2019. --The patient is still working full-time which is why he has not followed up routinely with his other medical providers. -Has not seen his PCP in several years. Previously seen at [facility] at [facility]. Has congential heart defect. Murmur noted on exam. At my last visit with the patient, I strongly encouraged him to follow up with PCP for health screenings and health maintenance.  Discussed it is important for them to follow-up on his congenital heart defect for close monitoring.  -He followed up with them on ***  *** medication. I encouraged him to discuss it with his family provider. -Reports decreased appetite.  Offered referral to member the nutritionist team which he declined at this time.  He is aware of this service if desired.   3. Social -Socially the patient lives in a [place] house with his wife and daughter. -The patient is still working full-time which is why he reports he is not followed up routinely with his other medical providers. -Discussed  radiology to be happy to write a work excuse note for him to come to his appointments and scan appointments as recommended.     PLAN: -Arrange PET scan for staging *** Vs CT CAP with bone scan  -F/U in 1 month with labs*** -5 mg of Norvasc   No orders of the defined types were placed in this encounter.    I spent {CHL ONC TIME VISIT - [id number]} counseling the patient face to face. The total time spent in the appointment was {CHL ONC TIME VISIT - [id number]}.  Haelyn Forgey L Jenasia Dolinar, PA-C 08/30/22

## 2022-09-02 ENCOUNTER — Other Ambulatory Visit: Payer: Self-pay | Admitting: Physician Assistant

## 2022-09-02 ENCOUNTER — Inpatient Hospital Stay: Payer: Commercial Managed Care - PPO | Attending: Physician Assistant

## 2022-09-02 ENCOUNTER — Inpatient Hospital Stay: Payer: Commercial Managed Care - PPO | Admitting: Physician Assistant

## 2022-09-02 ENCOUNTER — Other Ambulatory Visit: Payer: Self-pay

## 2022-09-02 ENCOUNTER — Encounter: Payer: Self-pay | Admitting: Physician Assistant

## 2022-09-02 VITALS — BP 186/78 | HR 73 | Temp 97.9°F | Resp 14 | Wt 134.8 lb

## 2022-09-02 DIAGNOSIS — I159 Secondary hypertension, unspecified: Secondary | ICD-10-CM | POA: Diagnosis not present

## 2022-09-02 DIAGNOSIS — G43909 Migraine, unspecified, not intractable, without status migrainosus: Secondary | ICD-10-CM | POA: Diagnosis not present

## 2022-09-02 DIAGNOSIS — Z87891 Personal history of nicotine dependence: Secondary | ICD-10-CM | POA: Insufficient documentation

## 2022-09-02 DIAGNOSIS — R011 Cardiac murmur, unspecified: Secondary | ICD-10-CM | POA: Diagnosis not present

## 2022-09-02 DIAGNOSIS — C7951 Secondary malignant neoplasm of bone: Secondary | ICD-10-CM | POA: Diagnosis not present

## 2022-09-02 DIAGNOSIS — C220 Liver cell carcinoma: Secondary | ICD-10-CM | POA: Insufficient documentation

## 2022-09-02 DIAGNOSIS — I10 Essential (primary) hypertension: Secondary | ICD-10-CM | POA: Diagnosis not present

## 2022-09-02 DIAGNOSIS — B1920 Unspecified viral hepatitis C without hepatic coma: Secondary | ICD-10-CM | POA: Diagnosis not present

## 2022-09-02 DIAGNOSIS — Z79899 Other long term (current) drug therapy: Secondary | ICD-10-CM | POA: Insufficient documentation

## 2022-09-02 DIAGNOSIS — N281 Cyst of kidney, acquired: Secondary | ICD-10-CM | POA: Diagnosis not present

## 2022-09-02 DIAGNOSIS — I700 Atherosclerosis of aorta: Secondary | ICD-10-CM | POA: Diagnosis not present

## 2022-09-02 LAB — CBC WITH DIFFERENTIAL (CANCER CENTER ONLY)
Abs Immature Granulocytes: 0.01 10*3/uL (ref 0.00–0.07)
Basophils Absolute: 0 10*3/uL (ref 0.0–0.1)
Basophils Relative: 1 %
Eosinophils Absolute: 0.2 10*3/uL (ref 0.0–0.5)
Eosinophils Relative: 4 %
HCT: 42.5 % (ref 39.0–52.0)
Hemoglobin: 14.3 g/dL (ref 13.0–17.0)
Immature Granulocytes: 0 %
Lymphocytes Relative: 31 %
Lymphs Abs: 1.3 10*3/uL (ref 0.7–4.0)
MCH: 31.8 pg (ref 26.0–34.0)
MCHC: 33.6 g/dL (ref 30.0–36.0)
MCV: 94.7 fL (ref 80.0–100.0)
Monocytes Absolute: 0.4 10*3/uL (ref 0.1–1.0)
Monocytes Relative: 11 %
Neutro Abs: 2.1 10*3/uL (ref 1.7–7.7)
Neutrophils Relative %: 53 %
Platelet Count: 165 10*3/uL (ref 150–400)
RBC: 4.49 MIL/uL (ref 4.22–5.81)
RDW: 13.2 % (ref 11.5–15.5)
WBC Count: 4.1 10*3/uL (ref 4.0–10.5)
nRBC: 0 % (ref 0.0–0.2)

## 2022-09-02 LAB — CMP (CANCER CENTER ONLY)
ALT: 9 U/L (ref 0–44)
AST: 14 U/L — ABNORMAL LOW (ref 15–41)
Albumin: 4.4 g/dL (ref 3.5–5.0)
Alkaline Phosphatase: 83 U/L (ref 38–126)
Anion gap: 6 (ref 5–15)
BUN: 17 mg/dL (ref 8–23)
CO2: 28 mmol/L (ref 22–32)
Calcium: 9.8 mg/dL (ref 8.9–10.3)
Chloride: 108 mmol/L (ref 98–111)
Creatinine: 1.23 mg/dL (ref 0.61–1.24)
GFR, Estimated: >60 mL/min (ref >60)
Glucose, Bld: 110 mg/dL — ABNORMAL HIGH (ref 70–99)
Potassium: 4.2 mmol/L (ref 3.5–5.1)
Sodium: 142 mmol/L (ref 135–145)
Total Bilirubin: 0.5 mg/dL (ref 0.3–1.2)
Total Protein: 7.9 g/dL (ref 6.5–8.1)

## 2022-09-02 MED ORDER — LOSARTAN POTASSIUM 25 MG PO TABS: 25.0000 mg | ORAL_TABLET | Freq: Every day | ORAL | 2 refills | Status: DC

## 2022-09-03 LAB — AFP TUMOR MARKER: AFP, Serum, Tumor Marker: 2.4 ng/mL (ref 0.0–8.4)

## 2022-09-04 ENCOUNTER — Other Ambulatory Visit: Payer: Self-pay | Admitting: Hematology

## 2022-09-04 ENCOUNTER — Other Ambulatory Visit: Payer: Self-pay

## 2022-09-04 ENCOUNTER — Encounter: Payer: Self-pay | Admitting: Hematology

## 2022-09-04 DIAGNOSIS — C220 Liver cell carcinoma: Secondary | ICD-10-CM

## 2022-09-04 NOTE — Progress Notes (Signed)
The proposed treatment discussed in conference is for discussion purpose only and is not a binding recommendation.  The patients have not been physically examined, or presented with their treatment options.  Therefore, final treatment plans cannot be decided.  

## 2022-09-05 ENCOUNTER — Ambulatory Visit (HOSPITAL_COMMUNITY)
Admission: RE | Admit: 2022-09-05 | Discharge: 2022-09-05 | Disposition: A | Discharge status: Home / Self Care | Payer: Commercial Managed Care - PPO | Source: Ambulatory Visit | Attending: Hematology | Admitting: Hematology

## 2022-09-05 ENCOUNTER — Inpatient Hospital Stay: Payer: Commercial Managed Care - PPO

## 2022-09-05 ENCOUNTER — Inpatient Hospital Stay (HOSPITAL_BASED_OUTPATIENT_CLINIC_OR_DEPARTMENT_OTHER): Payer: Commercial Managed Care - PPO | Admitting: Physician Assistant

## 2022-09-05 VITALS — BP 199/69 | HR 60 | Temp 98.0°F | Resp 16

## 2022-09-05 DIAGNOSIS — R030 Elevated blood-pressure reading, without diagnosis of hypertension: Secondary | ICD-10-CM | POA: Diagnosis not present

## 2022-09-05 DIAGNOSIS — C220 Liver cell carcinoma: Secondary | ICD-10-CM

## 2022-09-05 DIAGNOSIS — M549 Dorsalgia, unspecified: Secondary | ICD-10-CM | POA: Diagnosis not present

## 2022-09-05 LAB — CBC WITH DIFFERENTIAL (CANCER CENTER ONLY)
Abs Immature Granulocytes: 0 10*3/uL (ref 0.00–0.07)
Basophils Absolute: 0 10*3/uL (ref 0.0–0.1)
Basophils Relative: 1 %
Eosinophils Absolute: 0.1 10*3/uL (ref 0.0–0.5)
Eosinophils Relative: 3 %
HCT: 41.2 % (ref 39.0–52.0)
Hemoglobin: 14.1 g/dL (ref 13.0–17.0)
Immature Granulocytes: 0 %
Lymphocytes Relative: 29 %
Lymphs Abs: 1.6 10*3/uL (ref 0.7–4.0)
MCH: 32.1 pg (ref 26.0–34.0)
MCHC: 34.2 g/dL (ref 30.0–36.0)
MCV: 93.8 fL (ref 80.0–100.0)
Monocytes Absolute: 0.5 10*3/uL (ref 0.1–1.0)
Monocytes Relative: 8 %
Neutro Abs: 3.4 10*3/uL (ref 1.7–7.7)
Neutrophils Relative %: 59 %
Platelet Count: 176 10*3/uL (ref 150–400)
RBC: 4.39 MIL/uL (ref 4.22–5.81)
RDW: 13.2 % (ref 11.5–15.5)
WBC Count: 5.7 10*3/uL (ref 4.0–10.5)
nRBC: 0 % (ref 0.0–0.2)

## 2022-09-05 LAB — CMP (CANCER CENTER ONLY)
ALT: 13 U/L (ref 0–44)
AST: 16 U/L (ref 15–41)
Albumin: 4.1 g/dL (ref 3.5–5.0)
Alkaline Phosphatase: 82 U/L (ref 38–126)
Anion gap: 6 (ref 5–15)
BUN: 19 mg/dL (ref 8–23)
CO2: 26 mmol/L (ref 22–32)
Calcium: 9.4 mg/dL (ref 8.9–10.3)
Chloride: 109 mmol/L (ref 98–111)
Creatinine: 1.03 mg/dL (ref 0.61–1.24)
GFR, Estimated: >60 mL/min (ref >60)
Glucose, Bld: 91 mg/dL (ref 70–99)
Potassium: 3.7 mmol/L (ref 3.5–5.1)
Sodium: 141 mmol/L (ref 135–145)
Total Bilirubin: 0.6 mg/dL (ref 0.3–1.2)
Total Protein: 7.4 g/dL (ref 6.5–8.1)

## 2022-09-05 MED ORDER — CLONIDINE HCL 0.1 MG PO TABS
0.1000 mg | ORAL_TABLET | Freq: Once | ORAL | Status: AC
  Administered 2022-09-05: 0.1 mg via ORAL
  Filled 2022-09-05: qty 1

## 2022-09-05 NOTE — Progress Notes (Signed)
Symptom Management Consult note [place]    Patient Care Team: Jearld Fenton, NP as PCP - General (Internal Medicine) Truitt Merle, MD as Attending Physician (Hematology and Oncology)    Name of the patient: Corey Armstrong  [id number]  Dec 19, 1955   Date of visit: 09/05/2022   Chief Complaint/Reason for visit: back pain   Current Therapy: PO Lenvima started in 07/2022     ASSESSMENT & PLAN: Patient is a 66 y.o. 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 Burr Medico.  I have viewed most recent oncology note and lab work.    #) Metastatic hepatocellular carcinoma  -Patient scheduled for whole body scan and CT CAP 09/19/22 - Next appointment with oncologist is 10/07/22.   #) Back pain -MR lumbar spine from 06/18/22 showing L5 vertebral body lesion is most consistent with a metastasis.  -Neuro exam is normal. No skin changes, pain reproducible with palpation to paraspinal muscles of lumbar spine. -Xray lumbar spine is negative for fracture or acute osseous abnormality. I viewed image and agree with radiologist impression.  -Discussed with patient the possibly of occult fracture not seen on XR and we would need advanced imaging.  Patient is already scheduled for imaging as documented above.  With his current work schedule he does not wish to have those moved up.  -Discussed symptomatic management including lidocaine patches, topical analgesic cream   #)Hypertension -BP today 216/68. Started on Losartan 25 mg x 3 days ago by medical oncologist with plan to see establish care with  pcp 09/12/22 who can take over BP management. -With elevated pressure in office basic labs were checked and do not show signs of end organ damage. -He was given 0.1 mg clonidine as he is not due to take Losartan until this evening. Recheck BP is improved at 199/69. -Encouraged patient to monitor BP at home. Discussed signs of  hypertensive emergency and what would warrant ED evaluation. -Patient agreeable with plan. Oncologist made aware of plan as well.  Heme/Onc History: Oncology History  Hepatocellular carcinoma ([place])  12/08/2019 Imaging   MR ABDOMEN WWO CONTRAST   IMPRESSION: Two small masses in the right hepatic lobe which have characteristics diagnostic for hepatocellular carcinoma in the setting of cirrhosis. (LI-RADS Category 5: Definitely HCC)   No evidence of abdominal metastatic disease or other significant abnormality.   03/01/2020 Initial Diagnosis   Hepatocellular carcinoma ([place])   03/01/2020 Procedure   CT GUIDE TISSUE ABLATION     IMPRESSION: Successful percutaneous thermal ablation.   Signed,   Criselda Peaches, MD, RPVI   06/02/2020 Imaging   MR ABDOMEN WWO CONTRAST   IMPRESSION: 1. There has been interval ablation at two adjacent sites of the subcapsular right lobe of the liver, hepatic segment VI. There is no residual contrast enhancement at these adjacent sites. LI-RADS category 5 TR, nonviable. 2. There are scattered, subcentimeter foci of arterial hyperenhancement of the anterior right lobe of the liver, for example adjacent to the ablation site measuring 3 mm and in the medial anterior right lobe of the liver measuring 6 mm. This larger focus is unchanged compared to prior. Others were not previously seen. These findings are nonspecific and consistent with LI-RADS category 3. Attention on follow-up.   09/02/2020 Imaging   MR ABDOMEN WWO CONTRAST     IMPRESSION: 1. Post radiofrequency ablation along the margin of the RIGHT hemi liver. Heterogeneous appearance of this area on  both T1 and T2. No sign of residual enhancement. LR TR nonviable. 2. Very subtle focus of restricted diffusion in the cephalad lateral segment LEFT hepatic lobe measuring 7 mm, corresponding to low signal on image 27 of series 21. Arterial enhancement is not seen in this area and this  areas not seen on prior imaging. Not clear whether this represents a new lesion as this area is obscured largely by artifact on most sequences on today's study and on previous imaging evaluations. LR category 3, consider a 3 to six-month follow-up with abdominal MRI. 3. Lesions of concern scattered about the RIGHT hepatic lobe may represent perfusional anomalies as they were not seen on today's study. 4. Stable mild dilation of the biliary tree.   11/27/2020 Imaging   MR ABDOMEN WWO CONTRAST   IMPRESSION: Minimal decrease in size of ablation defect in the peripheral anterior right hepatic lobe. No evidence of locally recurrent hepatocellular carcinoma or other suspicious liver lesions.   No evidence of metastatic disease or other acute findings.   04/03/2021 Imaging   MR ABDOMEN WWO CONTRAST   IMPRESSION: Stable ablation defect in the lateral right hepatic lobe, without evidence of recurrent tumor at this site.   5 mm hypovascular lesion in segment 5 of the right lobe was not definitely seen on previous study, but is too small to characterize. This shows no arterial phase hyperenhancement. Recommend continued follow-up by MRI in 6 months.   No evidence of abdominal metastatic disease.        09/03/2021 Imaging   MR ABDOMEN WWO CONTRAST   IMPRESSION: 1. Side-by-side ablation sites in the right hepatic lobe in segment 6. The more anterior site remains largely cystic in appearance and no findings suspicious recurrent tumor in this area. There is however abnormal contrast enhancement around the larger more posterior and lateral ablation site worrisome for recurrent tumor. 2. Several small foci of early arterial phase enhancement in the liver. The peripheral lesions are likely vascular shunts. The more central lesions are likely dysplastic nodules. Recommend continued surveillance. 3. Stable bilateral renal cysts.   12/07/2021 Imaging   MR ABDOMEN WWO  CONTRAST  IMPRESSION: 1. Ablation defects in the right lobe of the liver, similar to the prior study, without definitive evidence to suggest local recurrence of disease or metastatic disease. 2. Multiple tiny arterial phase areas of nodular hyperenhancement scattered throughout the liver, predominantly in the right lobe of the liver, without perceptible signal abnormality or diffusion restriction on other pulse sequences, nonspecific, and likely small benign areas of vascular shunting. Continued attention on follow-up imaging is recommended. 3. Multiple Bosniak class 1 and Bosniak class 2 cysts in the kidneys bilaterally, similar to the prior study, as above. 4. Aortic atherosclerosis.     06/01/2022 Imaging   MR ABDOMEN WWO CONTRAST   IMPRESSION: 1. LR TR equivocal but suspicious findings with respect to previously treated disease in the RIGHT hepatic lobe given nodular enhancement adjacent and anterior superior to the ablation zone as described. 2. Given lack of late arterial phase due to bolus timing a second area of abnormality within the liver is suspicious for additional site of hepatocellular carcinoma showing washout appearance and capsule appearance on current imaging, cardiac pulsation limiting assessment in the area of concern. 3. Suspect bony metastatic disease in the lower lumbar spine at L5 level. Dedicated spinal imaging may be helpful for further evaluation.     06/18/2022 Imaging   MR Lumbar Spine W Wo Contrast   IMPRESSION: L5 vertebral  body lesion is most consistent with a metastasis. There is probable trace ventral epidural extension   07/17/2022 Procedure   CT GUIDE TISSUE ABLATION   IMPRESSION: Successful CT-guided microwave ablation of recurrent hepatocellular carcinoma.   07/17/2022 Procedure   CT BONE TROCAR/NEEDLE BIOPSY SUPERFICIAL   IMPRESSION: Successful CT-guided core biopsy of L5 bone lesion.      07/17/2022 Pathology Results   SURGICAL  PATHOLOGY  CASE: [id number]  PATIENT: Corey Armstrong  Surgical Pathology Report   SURGICAL PATHOLOGY  CASE: [id number]  PATIENT: Corey Armstrong  Surgical Pathology Report      Clinical History: Hx of cirrhosis and small HCC with unexpected  suspicious lesion at L5. Rare Mobile met to bone vs atypical hemangioma or  met from new primary. (crm)      FINAL MICROSCOPIC DIAGNOSIS:   A. BONE, L5 VERTEBRAL BODY LESION, BIOPSY:  - Metastatic carcinoma to bone, consistent with patient's clinical  history of primary hepatocellular carcinoma        Interval history-: Corey Chihuahua Sr. is a 66 y.o. male with oncologic history as above presenting to [facility] today with chief complaint of back pain x 1 day.  Patient reports yesterday morning around 5 AM he was driving to work when he sneezed and had sudden onset of sharp low back pain.  He describes the pain as being located close to his biopsy site.  He states it radiated to his thighs.  He rated the pain 8 out of 10 in severity.  He pulled over and attempted to stand outside of his vehicle however felt weak and slowly lowered himself to the ground.  He sat there for several minutes and then was able to stand and himself the rest of the way to work.  Once at work the pain was persisting and he told his supervisor he needed to go home.  He took Tylenol at home and the pain improved.  He states the pain is still constant and worse with movement.  He rates the pain currently 7 out of 10 in severity.  He took Tylenol this morning prior to arrival.  He does feel like Tylenol helps take the edge off.  He denies any bowel or bladder retention or incontinence. He has been ambulatory without assistance.  Chart review shows patient had biopsy of L5 lesion and liver lesion on 07/17/2022 by IR.  Patient reports pain from biopsy sites healed quickly and has not been bothering him.  Patient started blood pressure medication losartan 25 mg x 3 days  ago.  He takes it at night.  He states last night when he checked his blood pressure it was 737 systolic.  He denies any headache, visual changes, weakness, numbness, tingling, dizziness, chest pain.   ROS  All other systems are reviewed and are negative for acute change except as noted in the HPI.    No Known Allergies   Past Medical History:  Diagnosis Date   Cancer ([place])    Headache    migraines   Hepatitis    HX OF hEP c ?    Murmur, heart 1963     Past Surgical History:  Procedure Laterality Date   IR RADIOLOGIST EVAL & MGMT  01/25/2020   IR RADIOLOGIST EVAL & MGMT  03/21/2020   IR RADIOLOGIST EVAL & MGMT  06/07/2020   IR RADIOLOGIST EVAL & MGMT  09/05/2020   IR RADIOLOGIST EVAL & MGMT  12/05/2020   IR RADIOLOGIST EVAL & MGMT  05/09/2021  IR RADIOLOGIST EVAL & MGMT  09/06/2021   IR RADIOLOGIST EVAL & MGMT  12/12/2021   IR RADIOLOGIST EVAL & MGMT  06/25/2022   IR RADIOLOGIST EVAL & MGMT  07/24/2022   RADIOLOGY WITH ANESTHESIA N/A 03/01/2020   Procedure: CT WITH ANESTHESIA  THERMAL MICROWAVE  ABLATIION;  Surgeon: Jacqulynn Cadet, MD;  Location: [facility];  Service: Anesthesiology;  Laterality: N/A;   RADIOLOGY WITH ANESTHESIA N/A 07/17/2022   Procedure: CT MICROWAVE ABLATION;  Surgeon: Criselda Peaches, MD;  Location: [facility];  Service: Radiology;  Laterality: N/A;    Social History   Socioeconomic History   Marital status: Divorced    Spouse name: Not on file   Number of children: 2   Years of education: 12   Highest education level: Not on file  Occupational History   Not on file  Tobacco Use   Smoking status: Former   Smokeless tobacco: Never   Tobacco comments:    3 per week  Vaping Use   Vaping Use: Never used  Substance and Sexual Activity   Alcohol use: Not Currently   Drug use: No   Sexual activity: Not Currently  Other Topics Concern   Not on file  Social History Narrative   Not on file   Social Determinants of Health   Financial Resource Strain:  Not on file  Food Insecurity: Not on file  Transportation Needs: Not on file  Physical Activity: Not on file  Stress: Not on file  Social Connections: Not on file  Intimate Partner Violence: Not on file    Family History  Problem Relation Age of Onset   Arthritis Mother    Cancer Neg Hx    Diabetes Neg Hx    Drug abuse Neg Hx    Heart disease Neg Hx    Hyperlipidemia Neg Hx    Hypertension Neg Hx    Kidney disease Neg Hx    Stroke Neg Hx      Current Outpatient Medications:    LENVIMA, 8 MG DAILY DOSE, 2 x 4 MG capsule, TAKE 8 MG (2 X 4 MG CAPSULES) BY MOUTH 1 TIME A DAY., Disp: 60 each, Rfl: 0   losartan (COZAAR) 25 MG tablet, Take 1 tablet (25 mg total) by mouth daily., Disp: 30 tablet, Rfl: 2   ondansetron (ZOFRAN) 8 MG tablet, Take 1 tablet (8 mg total) by mouth every 8 (eight) hours as needed for nausea or vomiting., Disp: 30 tablet, Rfl: 2   prochlorperazine (COMPAZINE) 10 MG tablet, Take 1 tablet (10 mg total) by mouth every 6 (six) hours as needed., Disp: 30 tablet, Rfl: 2  PHYSICAL EXAM: ECOG FS:1 - Symptomatic but completely ambulatory    Vitals:   09/05/22 0950 09/05/22 1116  BP: (!) 216/68 (!) 199/69  Pulse: 60   Resp: 16   Temp: 98 F (36.7 C)   TempSrc: Oral   SpO2: 100%    Physical Exam Vitals and nursing note reviewed.  Constitutional:      Appearance: He is well-developed. He is not ill-appearing or toxic-appearing.  HENT:     Head: Normocephalic.     Nose: Nose normal.  Eyes:     Conjunctiva/sclera: Conjunctivae normal.  Neck:     Vascular: No JVD.  Cardiovascular:     Rate and Rhythm: Normal rate and regular rhythm.     Pulses: Normal pulses.     Heart sounds: Normal heart sounds.  Pulmonary:     Effort: Pulmonary effort is  normal.     Breath sounds: Normal breath sounds.  Abdominal:     General: There is no distension.  Musculoskeletal:        General: Normal range of motion.     Cervical back: Normal range of motion.     Right  lower leg: No edema.     Left lower leg: No edema.     Comments: Full range of motion of the T-spine and L-spine No tenderness to palpation of the spinous processes of the T-spine or L-spine No crepitus, deformity or step-offs Tenderness to palpation of the paraspinous muscles of the L-spine     Skin:    General: Skin is warm and dry.  Neurological:     Mental Status: He is oriented to person, place, and time.     Comments: Speech is clear and goal oriented, follows commands CN III-XII intact, no facial droop Normal strength in upper and lower extremities bilaterally including dorsiflexion and plantar flexion, strong and equal grip strength Sensation normal to light and sharp touch Moves extremities without ataxia, coordination intact Normal finger to nose and rapid alternating movements Normal gait and balance        LABORATORY DATA: I have reviewed the data as listed    Latest Ref Rng & Units 09/05/2022   10:27 AM 09/02/2022    8:07 AM 07/17/2022    7:47 AM  CBC  WBC 4.0 - 10.5 K/uL 5.7  4.1  4.8   Hemoglobin 13.0 - 17.0 g/dL 14.1  14.3  13.6   Hematocrit 39.0 - 52.0 % 41.2  42.5  41.9   Platelets 150 - 400 K/uL 176  165  180         Latest Ref Rng & Units 09/05/2022   10:27 AM 09/02/2022    8:07 AM 07/17/2022    7:47 AM  CMP  Glucose 70 - 99 mg/dL 91  110  93   BUN 8 - 23 mg/dL $Remove'19  17  18   'VewVzjm$ Creatinine 0.61 - 1.24 mg/dL 1.03  1.23  1.16   Sodium 135 - 145 mmol/L 141  142  142   Potassium 3.5 - 5.1 mmol/L 3.7  4.2  4.5   Chloride 98 - 111 mmol/L 109  108  112   CO2 22 - 32 mmol/L $RemoveB'26  28  23   'hhWzKQjn$ Calcium 8.9 - 10.3 mg/dL 9.4  9.8  9.6   Total Protein 6.5 - 8.1 g/dL 7.4  7.9  7.8   Total Bilirubin 0.3 - 1.2 mg/dL 0.6  0.5  0.4   Alkaline Phos 38 - 126 U/L 82  83  70   AST 15 - 41 U/L $Remo'16  14  19   'rJNAu$ ALT 0 - 44 U/L $Remo'13  9  14        'WmRTp$ RADIOGRAPHIC STUDIES (from last 24 hours if applicable) I have personally reviewed the radiological images as listed and agreed with  the findings in the report. DG Lumbar Spine Complete  Result Date: 09/05/2022 CLINICAL DATA:  66 year old male with new low back pain after sneezing. L5 lumbar metastasis in the setting of recurrent hepatocellular carcinoma. EXAM: LUMBAR SPINE - COMPLETE 4+ VIEW COMPARISON:  Lumbar MRI 06/18/2022. FINDINGS: Normal lumbar segmentation. Stable to mildly improved lumbar lordosis. Stable lumbar vertebral height. No pathologic compression fracture identified. Osteolysis of the posteroinferior L5 endplate corresponds to the August MRI appearance of metastasis there. Ununited L1 transverse processes, congenital normal variant. Stable disc spaces. Grossly intact visible  sacrum and SI joints. Calcified aortic atherosclerosis. Negative visible bowel gas pattern. IMPRESSION: 1. Known L5 vertebral body metastasis with no pathologic vertebral fracture or acute osseous abnormality identified in the lumbar spine. If there is suspicion of occult fracture repeat Lumbar MRI or Nuclear Medicine Whole-body Bone Scan would evaluate with the highest sensitivity. 2. Aortic Atherosclerosis (ICD10-I70.0). Electronically Signed   By: Genevie Ann M.D.   On: 09/05/2022 09:28        Visit Diagnosis: 1. Hepatocellular carcinoma (HCC)   2. Elevated blood pressure reading   3. Acute bilateral back pain, unspecified back location      Orders Placed This Encounter  Procedures   CBC with Differential ([place] Only)    Standing Status:   Standing    Number of Occurrences:   1    Standing Expiration Date:   09/06/2023   CMP ([place] only)    Standing Status:   Standing    Number of Occurrences:   1    Standing Expiration Date:   09/06/2023    All questions were answered. The patient knows to call the clinic with any problems, questions or concerns. No barriers to learning was detected.  I have spent a total of 20 minutes minutes of face-to-face and non-face-to-face time, preparing to see the patient, obtaining  and/or reviewing separately obtained history, performing a medically appropriate examination, counseling and educating the patient, ordering tests, documenting clinical information in the electronic health record, and care coordination (communications with other health care professionals or caregivers).    Thank you for allowing me to participate in the care of this patient.    Barrie Folk, PA-C Department of Hematology/Oncology [facility] at [facility] Phone: [phone]  [phone]    09/05/2022 11:18 AM

## 2022-09-12 ENCOUNTER — Encounter: Payer: Self-pay | Admitting: Internal Medicine

## 2022-09-12 ENCOUNTER — Ambulatory Visit: Payer: Commercial Managed Care - PPO | Admitting: Internal Medicine

## 2022-09-12 VITALS — BP 218/76 | HR 78 | Temp 97.4°F | Ht 67.5 in | Wt 133.0 lb

## 2022-09-12 DIAGNOSIS — C220 Liver cell carcinoma: Secondary | ICD-10-CM | POA: Diagnosis not present

## 2022-09-12 DIAGNOSIS — Z0000 Encounter for general adult medical examination without abnormal findings: Secondary | ICD-10-CM | POA: Diagnosis not present

## 2022-09-12 DIAGNOSIS — R011 Cardiac murmur, unspecified: Secondary | ICD-10-CM | POA: Diagnosis not present

## 2022-09-12 DIAGNOSIS — I10 Essential (primary) hypertension: Secondary | ICD-10-CM | POA: Insufficient documentation

## 2022-09-12 DIAGNOSIS — Z23 Encounter for immunization: Secondary | ICD-10-CM

## 2022-09-12 MED ORDER — LOSARTAN POTASSIUM-HCTZ 100-25 MG PO TABS: 1.0000 | ORAL_TABLET | Freq: Every day | ORAL | 3 refills | Status: DC

## 2022-09-12 NOTE — Assessment & Plan Note (Signed)
He notes heart murmur as child I suspect AS and now AI as well Will check echo

## 2022-09-12 NOTE — Assessment & Plan Note (Signed)
Not excited about vaccines--but did have the updated COVID Will do Td today Urged him to consider flu vaccine---maybe next time Will defer any consideration of colon/prostate cancer till prognosis known better

## 2022-09-12 NOTE — Addendum Note (Signed)
Addended by: Pilar Grammes on: 09/12/2022 12:08 PM   Modules accepted: Orders

## 2022-09-12 NOTE — Progress Notes (Signed)
Subjective:    Patient ID: Corey Chihuahua Sr., male    DOB: [birth year], 66 y.o.   MRN: [id number]  HPI Here to establish care Last seen here over 3 years ago  Diagnosed with hepatocellular carcinoma some time ago Hep C in the past ---did have Rx Recurred with lesion in spine recently Lenvima started a few weeks ago  Did have elevated BP in the past--but never needed Rx  Current Outpatient Medications on File Prior to Visit  Medication Sig Dispense Refill   LENVIMA, 8 MG DAILY DOSE, 2 x 4 MG capsule TAKE 8 MG (2 X 4 MG CAPSULES) BY MOUTH 1 TIME A DAY. 60 each 0   losartan (COZAAR) 25 MG tablet Take 1 tablet (25 mg total) by mouth daily. 30 tablet 2   ondansetron (ZOFRAN) 8 MG tablet Take 1 tablet (8 mg total) by mouth every 8 (eight) hours as needed for nausea or vomiting. 30 tablet 2   prochlorperazine (COMPAZINE) 10 MG tablet Take 1 tablet (10 mg total) by mouth every 6 (six) hours as needed. 30 tablet 2   No current facility-administered medications on file prior to visit.    No Known Allergies  Past Medical History:  Diagnosis Date   Cancer ([place])    Headache    migraines   Hepatitis    HX OF hEP c ?    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    Past Surgical History:  Procedure Laterality Date   IR RADIOLOGIST EVAL & MGMT  01/25/2020   IR RADIOLOGIST EVAL & MGMT  03/21/2020   IR RADIOLOGIST EVAL & MGMT  06/07/2020   IR RADIOLOGIST EVAL & MGMT  09/05/2020   IR RADIOLOGIST EVAL & MGMT  12/05/2020   IR RADIOLOGIST EVAL & MGMT  05/09/2021   IR RADIOLOGIST EVAL & MGMT  09/06/2021   IR RADIOLOGIST EVAL & MGMT  12/12/2021   IR RADIOLOGIST EVAL & MGMT  06/25/2022   IR RADIOLOGIST EVAL & MGMT  07/24/2022   RADIOLOGY WITH ANESTHESIA N/A 03/01/2020   Procedure: CT WITH ANESTHESIA  THERMAL MICROWAVE  ABLATIION;  Surgeon: Jacqulynn Cadet, MD;  Location: [facility];  Service: Anesthesiology;  Laterality: N/A;   RADIOLOGY WITH ANESTHESIA N/A 07/17/2022   Procedure: CT MICROWAVE  ABLATION;  Surgeon: Criselda Peaches, MD;  Location: [facility];  Service: Radiology;  Laterality: N/A;    Family History  Problem Relation Age of Onset   Arthritis Mother    Cancer Neg Hx    Diabetes Neg Hx    Drug abuse Neg Hx    Heart disease Neg Hx    Hyperlipidemia Neg Hx    Hypertension Neg Hx    Kidney disease Neg Hx    Stroke Neg Hx     Social History   Socioeconomic History   Marital status: Divorced    Spouse name: Not on file   Number of children: 2   Years of education: 12   Highest education level: Not on file  Occupational History   Occupation: Builds snowblowers    Employer: [employer]  Tobacco Use   Smoking status: Former    Passive exposure: Current   Smokeless tobacco: Never   Tobacco comments:    3 per week  Vaping Use   Vaping Use: Never used  Substance and Sexual Activity   Alcohol use: Not Currently   Drug use: No   Sexual activity: Not Currently  Other Topics Concern   Not on file  Social History Narrative   Divorced but has relationship with ex   2 children   Daughter lives with him   Son in [place]      No living will   Ex wife Myraette should make decisions on his behalf   Would accept resuscitation   Not sure about tube feeds   Social Determinants of Health   Financial Resource Strain: Not on file  Food Insecurity: Not on file  Transportation Needs: Not on file  Physical Activity: Not on file  Stress: Not on file  Social Connections: Not on file  Intimate Partner Violence: Not on file   Review of Systems  Constitutional:  Negative for fatigue and unexpected weight change.       Appetite is okay  Eyes:  Negative for visual disturbance.  Respiratory:  Negative for cough, chest tightness and shortness of breath.   Cardiovascular:  Negative for chest pain, palpitations and leg swelling.  Gastrointestinal:  Negative for blood in stool and constipation.       No heartburn  Genitourinary:  Negative for difficulty urinating and  urgency.  Musculoskeletal:  Negative for arthralgias, back pain and joint swelling.  Skin:  Negative for rash.  Allergic/Immunologic: Positive for environmental allergies. Negative for immunocompromised state.       Rare symptoms--uses OTC med prn  Neurological:  Positive for headaches.       Had spell after bad sneeze---close to passing out Uses tylenol for migraines Not much caffeine  Hematological:  Does not bruise/bleed easily.  Psychiatric/Behavioral:  Negative for dysphoric mood. The patient is not nervous/anxious.        Chronic restless sleeper No daytime somnolence        Objective:   Physical Exam Constitutional:      Appearance: Normal appearance.  HENT:     Mouth/Throat:     Pharynx: No oropharyngeal exudate or posterior oropharyngeal erythema.  Eyes:     Conjunctiva/sclera: Conjunctivae normal.     Pupils: Pupils are equal, round, and reactive to light.  Cardiovascular:     Rate and Rhythm: Normal rate and regular rhythm.     Pulses: Normal pulses.     Heart sounds:     No gallop.     Comments: Prominent aortic systolic murmur with blowing diastolic murmur also--heard throughout precordium Pulmonary:     Effort: Pulmonary effort is normal.     Breath sounds: Normal breath sounds. No wheezing or rales.  Abdominal:     Palpations: Abdomen is soft.     Tenderness: There is no abdominal tenderness.  Musculoskeletal:     Cervical back: Neck supple.     Right lower leg: No edema.     Left lower leg: No edema.  Lymphadenopathy:     Cervical: No cervical adenopathy.  Skin:    Findings: No lesion or rash.  Neurological:     Mental Status: He is alert.  Psychiatric:        Mood and Affect: Mood normal.        Behavior: Behavior normal.            Assessment & Plan:

## 2022-09-12 NOTE — Patient Instructions (Signed)
Monitor your blood pressure daily. If in 2 weeks, you are not below 160/95---let me know and I will add a second medicine.

## 2022-09-12 NOTE — Assessment & Plan Note (Addendum)
BP Readings from Last 3 Encounters:  09/12/22 (!) 218/76  09/05/22 (!) 199/69  09/02/22 (!) 186/78   Elevated BP fairly universal with lenvima Will markedly increase regimen to losartan/HCTZ 100/25 He will monitor daily. If still over 160/95 soon---will add amlodipine '5mg'$  daily

## 2022-09-12 NOTE — Assessment & Plan Note (Signed)
Recent bony metastasis noted Now on the lenvima Has upcoming appts with oncology

## 2022-09-15 ENCOUNTER — Encounter: Payer: Self-pay | Admitting: Internal Medicine

## 2022-09-15 ENCOUNTER — Encounter: Payer: Self-pay | Admitting: Hematology

## 2022-09-17 ENCOUNTER — Inpatient Hospital Stay: Payer: Commercial Managed Care - PPO

## 2022-09-19 ENCOUNTER — Ambulatory Visit (HOSPITAL_COMMUNITY)
Admission: RE | Admit: 2022-09-19 | Discharge: 2022-09-19 | Disposition: A | Discharge status: Home / Self Care | Payer: Commercial Managed Care - PPO | Source: Ambulatory Visit | Attending: Physician Assistant | Admitting: Physician Assistant

## 2022-09-19 ENCOUNTER — Encounter (HOSPITAL_COMMUNITY): Payer: Self-pay

## 2022-09-19 ENCOUNTER — Other Ambulatory Visit: Payer: Self-pay | Admitting: Hematology

## 2022-09-19 ENCOUNTER — Inpatient Hospital Stay: Payer: Commercial Managed Care - PPO | Attending: Hematology

## 2022-09-19 DIAGNOSIS — C220 Liver cell carcinoma: Secondary | ICD-10-CM | POA: Insufficient documentation

## 2022-09-19 DIAGNOSIS — C7951 Secondary malignant neoplasm of bone: Secondary | ICD-10-CM | POA: Insufficient documentation

## 2022-09-19 LAB — CBC WITH DIFFERENTIAL (CANCER CENTER ONLY)
Abs Immature Granulocytes: 0.01 10*3/uL (ref 0.00–0.07)
Basophils Absolute: 0.1 10*3/uL (ref 0.0–0.1)
Basophils Relative: 1 %
Eosinophils Absolute: 0.2 10*3/uL (ref 0.0–0.5)
Eosinophils Relative: 3 %
HCT: 43.4 % (ref 39.0–52.0)
Hemoglobin: 14.7 g/dL (ref 13.0–17.0)
Immature Granulocytes: 0 %
Lymphocytes Relative: 28 %
Lymphs Abs: 1.4 10*3/uL (ref 0.7–4.0)
MCH: 32 pg (ref 26.0–34.0)
MCHC: 33.9 g/dL (ref 30.0–36.0)
MCV: 94.3 fL (ref 80.0–100.0)
Monocytes Absolute: 0.5 10*3/uL (ref 0.1–1.0)
Monocytes Relative: 9 %
Neutro Abs: 2.9 10*3/uL (ref 1.7–7.7)
Neutrophils Relative %: 59 %
Platelet Count: 201 10*3/uL (ref 150–400)
RBC: 4.6 MIL/uL (ref 4.22–5.81)
RDW: 13.2 % (ref 11.5–15.5)
WBC Count: 4.9 10*3/uL (ref 4.0–10.5)
nRBC: 0 % (ref 0.0–0.2)

## 2022-09-19 LAB — CMP (CANCER CENTER ONLY)
ALT: 9 U/L (ref 0–44)
AST: 14 U/L — ABNORMAL LOW (ref 15–41)
Albumin: 4.3 g/dL (ref 3.5–5.0)
Alkaline Phosphatase: 75 U/L (ref 38–126)
Anion gap: 6 (ref 5–15)
BUN: 22 mg/dL (ref 8–23)
CO2: 28 mmol/L (ref 22–32)
Calcium: 9.8 mg/dL (ref 8.9–10.3)
Chloride: 108 mmol/L (ref 98–111)
Creatinine: 1.27 mg/dL — ABNORMAL HIGH (ref 0.61–1.24)
GFR, Estimated: >60 mL/min (ref >60)
Glucose, Bld: 93 mg/dL (ref 70–99)
Potassium: 4.2 mmol/L (ref 3.5–5.1)
Sodium: 142 mmol/L (ref 135–145)
Total Bilirubin: 0.5 mg/dL (ref 0.3–1.2)
Total Protein: 7.8 g/dL (ref 6.5–8.1)

## 2022-09-19 MED ORDER — IOHEXOL 300 MG/ML  SOLN
100.0000 mL | Freq: Once | INTRAMUSCULAR | Status: AC | PRN
  Administered 2022-09-19: 100 mL via INTRAVENOUS

## 2022-09-19 MED ORDER — TECHNETIUM TC 99M MEDRONATE IV KIT
20.0000 | PACK | Freq: Once | INTRAVENOUS | Status: AC | PRN
  Administered 2022-09-19: 21.6 via INTRAVENOUS

## 2022-09-20 ENCOUNTER — Encounter: Payer: Self-pay | Admitting: Hematology

## 2022-09-23 ENCOUNTER — Telehealth: Payer: Self-pay

## 2022-09-23 ENCOUNTER — Telehealth: Payer: Self-pay | Admitting: Physician Assistant

## 2022-09-23 NOTE — Telephone Encounter (Signed)
This nurse attempted to reach patient related to his back pain.  There is no answer, This nurse will also send a message on My Chart.  No further concerns at this time.

## 2022-09-23 NOTE — Telephone Encounter (Signed)
I reviewed the patient's staging bone scan and CT scan with Dr. Burr Medico. There was delays with insurance authorization. A few weeks ago, he was endorsing back pain which occurred after sneezing. I was calling to see if his back pain improved. His CT showed small retropulsed bony fragment new from prior MRI in July. Unclear if this is from his recent episode of sneezing/back pain, or if that has been present since his prior biopsy to this region. If he is still having pain, Dr. Burr Medico recommended referral to orthopedic surgery. Regarding the liver lesion, because of the delay in scan authorization, it is unclear if this disease progression is due to treatment failure with lenvima or progression from last MRI in July to starting treatment in September. He has only been on [facility] for a little over a month. Dr. Burr Medico will discuss at his next appointment but she is recommending short interval follow up CT in about a month, which she will discuss with him at his next appointment with her on 10/07/22.   I was calling to check on his back pain. Unable to reach him. I left a voicemail asking to give [place] a call back to review the information above.

## 2022-09-25 ENCOUNTER — Other Ambulatory Visit: Payer: Self-pay | Admitting: Interventional Radiology

## 2022-09-25 ENCOUNTER — Telehealth: Payer: Self-pay | Admitting: Hematology

## 2022-09-25 DIAGNOSIS — C220 Liver cell carcinoma: Secondary | ICD-10-CM

## 2022-09-25 NOTE — Telephone Encounter (Signed)
Called patient regarding November appointments, left a voicemail.

## 2022-09-26 ENCOUNTER — Encounter: Payer: Self-pay | Admitting: Hematology

## 2022-09-26 ENCOUNTER — Encounter (HOSPITAL_COMMUNITY): Payer: Self-pay | Admitting: Internal Medicine

## 2022-10-07 ENCOUNTER — Inpatient Hospital Stay: Payer: Commercial Managed Care - PPO

## 2022-10-07 ENCOUNTER — Inpatient Hospital Stay (HOSPITAL_BASED_OUTPATIENT_CLINIC_OR_DEPARTMENT_OTHER): Payer: Commercial Managed Care - PPO | Admitting: Hematology

## 2022-10-07 ENCOUNTER — Encounter: Payer: Self-pay | Admitting: Hematology

## 2022-10-07 DIAGNOSIS — C220 Liver cell carcinoma: Secondary | ICD-10-CM

## 2022-10-07 NOTE — Progress Notes (Signed)
[facility]   Telephone:([phone] Fax:(336) [phone]   Clinic Follow up Note   Patient Care Team: Venia Carbon, MD as PCP - General (Internal Medicine) Truitt Merle, MD as Attending Physician (Hematology and Oncology)  Date of Service:  10/07/2022  I connected with Corey Chihuahua Sr. on 10/07/2022 at  1:40 PM EST by telephone visit and verified that I am speaking with the correct person using two identifiers.  I discussed the limitations, risks, security and privacy concerns of performing an evaluation and management service by telephone and the availability of in person appointments. I also discussed with the patient that there may be a patient responsible charge related to this service. The patient expressed understanding and agreed to proceed.   Other persons participating in the visit and their role in the encounter:  none  Patient's location:  work Provider's location:  my office  CHIEF COMPLAINT: f/u of metastatic [place]  CURRENT THERAPY:  Lenvima, starting 08/16/22  ASSESSMENT & PLAN:  Corey Chihuahua Sr. is a 66 y.o. male with   1. Metastatic hepatocellular carcinoma  -diagnosed 11/2019 by MRI for f/u of cirrhosis. S/p microwave ablation on 03/01/20. -MRI 06/01/22 showed suspicion for local recurrence and metastasis at L5. Biopsy of L5 confirmed metastatic HCC. S/p repeat microwave ablation on 07/17/22. -started lenvima 08/16/22 -staging CT CAP and bone scan on 09/19/22 showed: interval enlargement of right hepatic lobe HCC; new retropulsed bony fragment at L5; no other new lesions. His previous CT was in 05/2022 and he did not start Lenvima until 07/2022, so it's hard to evaluate if he is responding to [facility] or not, the progression is likely the nature course of his [facility]    PLAN: -I reviewed the recent scan results with him today. While there is enlargement of his HCC, I'm not convinced he has failed lenvima yet given the timing. He is tolerating  Lenvima wo any side effects. I recommend continuing lenvima for now and repeat scans in 3 months.  -I will also reach out to Dr. Laurence Ferrari for his input and recommendations about liver targeted therapy.   No problem-specific Assessment & Plan notes found for this encounter.    SUMMARY OF ONCOLOGIC HISTORY: Oncology History  Hepatocellular carcinoma ([place])  12/08/2019 Imaging   MR ABDOMEN WWO CONTRAST   IMPRESSION: Two small masses in the right hepatic lobe which have characteristics diagnostic for hepatocellular carcinoma in the setting of cirrhosis. (LI-RADS Category 5: Definitely HCC)   No evidence of abdominal metastatic disease or other significant abnormality.   03/01/2020 Initial Diagnosis   Hepatocellular carcinoma ([place])   03/01/2020 Procedure   CT GUIDE TISSUE ABLATION     IMPRESSION: Successful percutaneous thermal ablation.   Signed,   Criselda Peaches, MD, RPVI   06/02/2020 Imaging   MR ABDOMEN WWO CONTRAST   IMPRESSION: 1. There has been interval ablation at two adjacent sites of the subcapsular right lobe of the liver, hepatic segment VI. There is no residual contrast enhancement at these adjacent sites. LI-RADS category 5 TR, nonviable. 2. There are scattered, subcentimeter foci of arterial hyperenhancement of the anterior right lobe of the liver, for example adjacent to the ablation site measuring 3 mm and in the medial anterior right lobe of the liver measuring 6 mm. This larger focus is unchanged compared to prior. Others were not previously seen. These findings are nonspecific and consistent with LI-RADS category 3. Attention on follow-up.   09/02/2020 Imaging   MR ABDOMEN [facility]  CONTRAST     IMPRESSION: 1. Post radiofrequency ablation along the margin of the RIGHT hemi liver. Heterogeneous appearance of this area on both T1 and T2. No sign of residual enhancement. LR TR nonviable. 2. Very subtle focus of restricted diffusion in the cephalad  lateral segment LEFT hepatic lobe measuring 7 mm, corresponding to low signal on image 27 of series 21. Arterial enhancement is not seen in this area and this areas not seen on prior imaging. Not clear whether this represents a new lesion as this area is obscured largely by artifact on most sequences on today's study and on previous imaging evaluations. LR category 3, consider a 3 to six-month follow-up with abdominal MRI. 3. Lesions of concern scattered about the RIGHT hepatic lobe may represent perfusional anomalies as they were not seen on today's study. 4. Stable mild dilation of the biliary tree.   11/27/2020 Imaging   MR ABDOMEN WWO CONTRAST   IMPRESSION: Minimal decrease in size of ablation defect in the peripheral anterior right hepatic lobe. No evidence of locally recurrent hepatocellular carcinoma or other suspicious liver lesions.   No evidence of metastatic disease or other acute findings.   04/03/2021 Imaging   MR ABDOMEN WWO CONTRAST   IMPRESSION: Stable ablation defect in the lateral right hepatic lobe, without evidence of recurrent tumor at this site.   5 mm hypovascular lesion in segment 5 of the right lobe was not definitely seen on previous study, but is too small to characterize. This shows no arterial phase hyperenhancement. Recommend continued follow-up by MRI in 6 months.   No evidence of abdominal metastatic disease.        09/03/2021 Imaging   MR ABDOMEN WWO CONTRAST   IMPRESSION: 1. Side-by-side ablation sites in the right hepatic lobe in segment 6. The more anterior site remains largely cystic in appearance and no findings suspicious recurrent tumor in this area. There is however abnormal contrast enhancement around the larger more posterior and lateral ablation site worrisome for recurrent tumor. 2. Several small foci of early arterial phase enhancement in the liver. The peripheral lesions are likely vascular shunts. The more central  lesions are likely dysplastic nodules. Recommend continued surveillance. 3. Stable bilateral renal cysts.   12/07/2021 Imaging   MR ABDOMEN WWO CONTRAST  IMPRESSION: 1. Ablation defects in the right lobe of the liver, similar to the prior study, without definitive evidence to suggest local recurrence of disease or metastatic disease. 2. Multiple tiny arterial phase areas of nodular hyperenhancement scattered throughout the liver, predominantly in the right lobe of the liver, without perceptible signal abnormality or diffusion restriction on other pulse sequences, nonspecific, and likely small benign areas of vascular shunting. Continued attention on follow-up imaging is recommended. 3. Multiple Bosniak class 1 and Bosniak class 2 cysts in the kidneys bilaterally, similar to the prior study, as above. 4. Aortic atherosclerosis.     06/01/2022 Imaging   MR ABDOMEN WWO CONTRAST   IMPRESSION: 1. LR TR equivocal but suspicious findings with respect to previously treated disease in the RIGHT hepatic lobe given nodular enhancement adjacent and anterior superior to the ablation zone as described. 2. Given lack of late arterial phase due to bolus timing a second area of abnormality within the liver is suspicious for additional site of hepatocellular carcinoma showing washout appearance and capsule appearance on current imaging, cardiac pulsation limiting assessment in the area of concern. 3. Suspect bony metastatic disease in the lower lumbar spine at L5 level. Dedicated spinal imaging may  be helpful for further evaluation.     06/18/2022 Imaging   MR Lumbar Spine W Wo Contrast   IMPRESSION: L5 vertebral body lesion is most consistent with a metastasis. There is probable trace ventral epidural extension   07/17/2022 Procedure   CT GUIDE TISSUE ABLATION   IMPRESSION: Successful CT-guided microwave ablation of recurrent hepatocellular carcinoma.   07/17/2022 Procedure   CT BONE  TROCAR/NEEDLE BIOPSY SUPERFICIAL   IMPRESSION: Successful CT-guided core biopsy of L5 bone lesion.      07/17/2022 Pathology Results   SURGICAL PATHOLOGY  CASE: [id number]  PATIENT: Corey Armstrong  Surgical Pathology Report   SURGICAL PATHOLOGY  CASE: [id number]  PATIENT: Corey Armstrong  Surgical Pathology Report      Clinical History: Hx of cirrhosis and small HCC with unexpected  suspicious lesion at L5. Rare [place] met to bone vs atypical hemangioma or  met from new primary. (crm)      FINAL MICROSCOPIC DIAGNOSIS:   A. 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 was contacted for a follow up of [place]. He was last seen by PA Cassie on 09/02/22. He reports his pain has resolved since his last visit.    All other systems were reviewed with the patient and are negative.  MEDICAL HISTORY:  Past Medical History:  Diagnosis Date   Cancer ([place])    Headache    migraines   Hepatitis    HX OF hEP c ?    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    SURGICAL HISTORY: Past Surgical History:  Procedure Laterality Date   IR RADIOLOGIST EVAL & MGMT  01/25/2020   IR RADIOLOGIST EVAL & MGMT  03/21/2020   IR RADIOLOGIST EVAL & MGMT  06/07/2020   IR RADIOLOGIST EVAL & MGMT  09/05/2020   IR RADIOLOGIST EVAL & MGMT  12/05/2020   IR RADIOLOGIST EVAL & MGMT  05/09/2021   IR RADIOLOGIST EVAL & MGMT  09/06/2021   IR RADIOLOGIST EVAL & MGMT  12/12/2021   IR RADIOLOGIST EVAL & MGMT  06/25/2022   IR RADIOLOGIST EVAL & MGMT  07/24/2022   RADIOLOGY WITH ANESTHESIA N/A 03/01/2020   Procedure: CT WITH ANESTHESIA  THERMAL MICROWAVE  ABLATIION;  Surgeon: Jacqulynn Cadet, MD;  Location: [facility];  Service: Anesthesiology;  Laterality: N/A;   RADIOLOGY WITH ANESTHESIA N/A 07/17/2022   Procedure: CT MICROWAVE ABLATION;  Surgeon:  Criselda Peaches, MD;  Location: [facility];  Service: Radiology;  Laterality: N/A;    I have reviewed the social history and family history with the patient and they are unchanged from previous note.  ALLERGIES:  has No Known Allergies.  MEDICATIONS:  Current Outpatient Medications  Medication Sig Dispense Refill   LENVIMA, 8 MG DAILY DOSE, 2 x 4 MG capsule TAKE 8 MG (2 X 4 MG CAPSULES) BY MOUTH 1 TIME A DAY. 60 each 0   losartan-hydrochlorothiazide (HYZAAR) 100-25 MG tablet Take 1 tablet by mouth daily. 90 tablet 3   ondansetron (ZOFRAN) 8 MG tablet Take 1 tablet (8 mg total) by mouth every 8 (eight) hours as needed for nausea or vomiting. 30 tablet 2   prochlorperazine (COMPAZINE) 10 MG tablet Take 1 tablet (10 mg total) by mouth every 6 (six) hours as needed. 30 tablet 2   No current facility-administered medications for this visit.  PHYSICAL EXAMINATION: ECOG PERFORMANCE STATUS: 0 - Asymptomatic  There were no vitals filed for this visit. Wt Readings from Last 3 Encounters:  09/12/22 133 lb (60.3 kg)  09/02/22 134 lb 12.8 oz (61.1 kg)  08/02/22 135 lb 9.6 oz (61.5 kg)     No vitals taken today, Exam not performed today  LABORATORY DATA:  I have reviewed the data as listed    Latest Ref Rng & Units 09/19/2022    8:11 AM 09/05/2022   10:27 AM 09/02/2022    8:07 AM  CBC  WBC 4.0 - 10.5 K/uL 4.9  5.7  4.1   Hemoglobin 13.0 - 17.0 g/dL 14.7  14.1  14.3   Hematocrit 39.0 - 52.0 % 43.4  41.2  42.5   Platelets 150 - 400 K/uL 201  176  165         Latest Ref Rng & Units 09/19/2022    8:11 AM 09/05/2022   10:27 AM 09/02/2022    8:07 AM  CMP  Glucose 70 - 99 mg/dL 93  91  110   BUN 8 - 23 mg/dL _0 Creatinine 0.61 - 1.24 mg/dL 1.27  1.03  1.23   Sodium 135 - 145 mmol/L 142  141  142   Potassium 3.5 - 5.1 mmol/L 4.2  3.7  4.2   Chloride 98 - 111 mmol/L 108  109  108   CO2 22 - 32 mmol/L _1 Calcium 8.9 - 10.3 mg/dL 9.8  9.4  9.8   Total Protein 6.5  - 8.1 g/dL 7.8  7.4  7.9   Total Bilirubin 0.3 - 1.2 mg/dL 0.5  0.6  0.5   Alkaline Phos 38 - 126 U/L 75  82  83   AST 15 - 41 U/L _2 ALT 0 - 44 U/L _3 RADIOGRAPHIC STUDIES: I have personally reviewed the radiological images as listed and agreed with the findings in the report. No results found.    Orders Placed This Encounter  Procedures   CT ABDOMEN PELVIS W CONTRAST    Standing Status:   Future    Standing Expiration Date:   10/08/2023    Order Specific Question:   If indicated for the ordered procedure, I authorize the administration of contrast media per Radiology protocol    Answer:   Yes    Order Specific Question:   Preferred imaging location?    Answer:   [facility]    Order Specific Question:   Is Oral Contrast requested for this exam?    Answer:   Yes, Per Radiology protocol   All questions were answered. The patient knows to call the clinic with any problems, questions or concerns. No barriers to learning was detected. The total time spent in the appointment was 22 minutes.     Truitt Merle, MD 10/07/2022   I, Wilburn Mylar, am acting as scribe for Truitt Merle, MD.   I have reviewed the above documentation for accuracy and completeness, and I agree with the above.

## 2022-10-08 ENCOUNTER — Telehealth: Payer: Self-pay | Admitting: Hematology

## 2022-10-08 NOTE — Telephone Encounter (Signed)
Called patient to notify of f/u. Left voicemail with appointment information

## 2022-10-09 ENCOUNTER — Telehealth (HOSPITAL_COMMUNITY): Payer: Self-pay | Admitting: Internal Medicine

## 2022-10-09 NOTE — Telephone Encounter (Signed)
Just an FYI. We have made several attempts to contact this patient including sending a letter to schedule or reschedule their echocardiogram. We will be removing the patient from the echo WQ.   MAILED LETTER [facility] 09/26/22 [facility] to schedule @ 10:14/LBW 09/19/22 [facility] to schedule @ 11:47/LBW      Thank you

## 2022-10-23 ENCOUNTER — Other Ambulatory Visit: Payer: Self-pay | Admitting: Hematology

## 2022-10-23 DIAGNOSIS — C220 Liver cell carcinoma: Secondary | ICD-10-CM

## 2022-11-15 ENCOUNTER — Other Ambulatory Visit: Payer: Self-pay | Admitting: Hematology

## 2022-11-15 DIAGNOSIS — C220 Liver cell carcinoma: Secondary | ICD-10-CM

## 2022-11-18 ENCOUNTER — Encounter: Payer: Self-pay | Admitting: Internal Medicine

## 2022-11-18 ENCOUNTER — Ambulatory Visit: Payer: Commercial Managed Care - PPO | Admitting: Internal Medicine

## 2022-11-18 VITALS — BP 137/79 | HR 75 | Temp 97.8°F | Ht 67.5 in | Wt 131.0 lb

## 2022-11-18 DIAGNOSIS — I10 Essential (primary) hypertension: Secondary | ICD-10-CM | POA: Diagnosis not present

## 2022-11-18 LAB — HEPATIC FUNCTION PANEL
ALT: 11 U/L (ref 0–53)
AST: 18 U/L (ref 0–37)
Albumin: 4.3 g/dL (ref 3.5–5.2)
Alkaline Phosphatase: 84 U/L (ref 39–117)
Bilirubin, Direct: 0 mg/dL (ref 0.0–0.3)
Total Bilirubin: 0.3 mg/dL (ref 0.2–1.2)
Total Protein: 7.4 g/dL (ref 6.0–8.3)

## 2022-11-18 LAB — CBC
HCT: 43.5 % (ref 39.0–52.0)
Hemoglobin: 14.6 g/dL (ref 13.0–17.0)
MCHC: 33.5 g/dL (ref 30.0–36.0)
MCV: 96.2 fl (ref 78.0–100.0)
Platelets: 198 10*3/uL (ref 150.0–400.0)
RBC: 4.52 Mil/uL (ref 4.22–5.81)
RDW: 13.7 % (ref 11.5–15.5)
WBC: 4.6 10*3/uL (ref 4.0–10.5)

## 2022-11-18 LAB — RENAL FUNCTION PANEL
Albumin: 4.3 g/dL (ref 3.5–5.2)
BUN: 31 mg/dL — ABNORMAL HIGH (ref 6–23)
CO2: 29 mEq/L (ref 19–32)
Calcium: 9.7 mg/dL (ref 8.4–10.5)
Chloride: 107 mEq/L (ref 96–112)
Creatinine, Ser: 1.26 mg/dL (ref 0.40–1.50)
GFR: 60.24 mL/min (ref >60.00)
Glucose, Bld: 97 mg/dL (ref 70–99)
Phosphorus: 3.1 mg/dL (ref 2.3–4.6)
Potassium: 4.8 mEq/L (ref 3.5–5.1)
Sodium: 141 mEq/L (ref 135–145)

## 2022-11-18 MED ORDER — LOSARTAN POTASSIUM-HCTZ 100-25 MG PO TABS: 0.5000 | ORAL_TABLET | Freq: Every day | ORAL | 0 refills | Status: DC

## 2022-11-18 NOTE — Progress Notes (Signed)
Subjective:    Patient ID: Corey Chihuahua Sr., male    DOB: 08/29/1956, 67 y.o.   MRN: [id number]  HPI Here for follow up of HTN  Doing well Did have trouble at first with the BP med Had to cut the med in half due to low readings 126/64, 137/79 His cuff correlated with high readings here  Working full time No chest pain No SOB Only rare headache No palpitations  Current Outpatient Medications on File Prior to Visit  Medication Sig Dispense Refill   LENVIMA, 8 MG DAILY DOSE, 2 x 4 MG capsule TAKE 8 MG (2 X 4 MG CAPSULES) BY MOUTH 1 TIME A DAY. 60 each 0   losartan-hydrochlorothiazide (HYZAAR) 100-25 MG tablet Take 1 tablet by mouth daily. (Patient taking differently: Take 0.5 tablets by mouth daily.) 90 tablet 3   ondansetron (ZOFRAN) 8 MG tablet Take 1 tablet (8 mg total) by mouth every 8 (eight) hours as needed for nausea or vomiting. 30 tablet 2   prochlorperazine (COMPAZINE) 10 MG tablet Take 1 tablet (10 mg total) by mouth every 6 (six) hours as needed. 30 tablet 2   No current facility-administered medications on file prior to visit.    No Known Allergies  Past Medical History:  Diagnosis Date   Cancer ([place])    Headache    migraines   Hepatitis    HX OF hEP c ?    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    Past Surgical History:  Procedure Laterality Date   IR RADIOLOGIST EVAL & MGMT  01/25/2020   IR RADIOLOGIST EVAL & MGMT  03/21/2020   IR RADIOLOGIST EVAL & MGMT  06/07/2020   IR RADIOLOGIST EVAL & MGMT  09/05/2020   IR RADIOLOGIST EVAL & MGMT  12/05/2020   IR RADIOLOGIST EVAL & MGMT  05/09/2021   IR RADIOLOGIST EVAL & MGMT  09/06/2021   IR RADIOLOGIST EVAL & MGMT  12/12/2021   IR RADIOLOGIST EVAL & MGMT  06/25/2022   IR RADIOLOGIST EVAL & MGMT  07/24/2022   RADIOLOGY WITH ANESTHESIA N/A 03/01/2020   Procedure: CT WITH ANESTHESIA  THERMAL MICROWAVE  ABLATIION;  Surgeon: Jacqulynn Cadet, MD;  [facility]: [facility];  Service: Anesthesiology;  Laterality: N/A;    RADIOLOGY WITH ANESTHESIA N/A 07/17/2022   Procedure: CT MICROWAVE ABLATION;  Surgeon: Criselda Peaches, MD;  Location: [facility];  Service: Radiology;  Laterality: N/A;    Family History  Problem Relation Age of Onset   Arthritis Mother    Cancer Neg Hx    Diabetes Neg Hx    Drug abuse Neg Hx    Heart disease Neg Hx    Hyperlipidemia Neg Hx    Hypertension Neg Hx    Kidney disease Neg Hx    Stroke Neg Hx     Social History   Socioeconomic History   Marital status: Divorced    Spouse name: Not on file   Number of children: 2   Years of education: 12   Highest education level: Not on file  Occupational History   Occupation: Builds snowblowers    Employer: [employer]  Tobacco Use   Smoking status: Former    Passive exposure: Current   Smokeless tobacco: Never   Tobacco comments:    3 per week  Vaping Use   Vaping Use: Never used  Substance and Sexual Activity   Alcohol use: Not Currently   Drug use: No   Sexual activity: Not Currently  Other Topics Concern   Not on file  Social History Narrative   Divorced but has relationship with ex   2 children   Daughter lives with him   Son in [place]      No living will   Ex wife Myraette should make decisions on his behalf   Would accept resuscitation   Not sure about tube feeds   Social Determinants of Health   Financial Resource Strain: Not on file  Food Insecurity: Not on file  Transportation Needs: Not on file  Physical Activity: Not on file  Stress: Not on file  Social Connections: Not on file  Intimate Partner Violence: Not on file   Review of Systems Appetite is normal Weight is stable Sleeps fair    Objective:   Physical Exam Constitutional:      Appearance: Normal appearance.  Cardiovascular:     Rate and Rhythm: Normal rate and regular rhythm.     Heart sounds:     No gallop.     Comments: Gr 3/6 systolic murmur--diastolic not as obvious Pulmonary:     Effort: Pulmonary effort is normal.      Breath sounds: Normal breath sounds. No wheezing or rales.  Musculoskeletal:     Cervical back: Neck supple.     Right lower leg: No edema.     Left lower leg: No edema.  Lymphadenopathy:     Cervical: No cervical adenopathy.  Neurological:     Mental Status: He is alert.  Psychiatric:        Mood and Affect: Mood normal.        Behavior: Behavior normal.            Assessment & Plan:

## 2022-11-18 NOTE — Assessment & Plan Note (Signed)
BP Readings from Last 3 Encounters:  11/18/22 137/79  09/12/22 (!) 218/76  09/05/22 (!) 199/69   Doing better at home Some degree of white coat HTN now---BP high (correlated with his cuff) that was drifting down as he was here Will continue the losartan/HCTZ 100/25--- 0.5 tabs daily (hypotension and dizziness with full tab) Will check labs today

## 2022-11-22 ENCOUNTER — Encounter: Payer: Self-pay | Admitting: Interventional Radiology

## 2022-12-11 ENCOUNTER — Other Ambulatory Visit: Payer: Self-pay | Admitting: Hematology

## 2022-12-11 DIAGNOSIS — C220 Liver cell carcinoma: Secondary | ICD-10-CM

## 2023-01-02 ENCOUNTER — Ambulatory Visit (HOSPITAL_COMMUNITY)
Admission: RE | Admit: 2023-01-02 | Discharge: 2023-01-02 | Disposition: A | Discharge status: Home / Self Care | Payer: Commercial Managed Care - PPO | Source: Ambulatory Visit | Attending: Hematology | Admitting: Hematology

## 2023-01-02 ENCOUNTER — Encounter (HOSPITAL_COMMUNITY): Payer: Self-pay

## 2023-01-02 DIAGNOSIS — C220 Liver cell carcinoma: Secondary | ICD-10-CM | POA: Insufficient documentation

## 2023-01-02 LAB — POCT I-STAT CREATININE: Creatinine, Ser: 1.3 mg/dL — ABNORMAL HIGH (ref 0.61–1.24)

## 2023-01-02 MED ORDER — IOHEXOL 300 MG/ML  SOLN
100.0000 mL | Freq: Once | INTRAMUSCULAR | Status: AC | PRN
  Administered 2023-01-02: 100 mL via INTRAVENOUS

## 2023-01-02 MED ORDER — SODIUM CHLORIDE (PF) 0.9 % IJ SOLN
INTRAMUSCULAR | Status: AC
  Filled 2023-01-02: qty 50

## 2023-01-06 ENCOUNTER — Other Ambulatory Visit: Payer: Self-pay | Admitting: Hematology

## 2023-01-06 DIAGNOSIS — C220 Liver cell carcinoma: Secondary | ICD-10-CM

## 2023-01-07 NOTE — Assessment & Plan Note (Signed)
-  diagnosed 11/2019 by MRI for f/u of cirrhosis. S/p microwave ablation on 03/01/20. -MRI 06/01/22 showed suspicion for local recurrence and metastasis at L5. Biopsy of L5 confirmed metastatic HCC. S/p repeat microwave ablation on 07/17/22. -started lenvima 08/16/22 -staging CT CAP and bone scan on 09/19/22 showed: interval enlargement of right hepatic lobe HCC; new retropulsed bony fragment at L5; no other new lesions. His previous CT was in 05/2022 and he did not start Lenvima until 07/2022, so it's hard to evaluate if he is responding to [facility] or not, the progression is likely the nature course of his [facility]  -Restaging CT scan from January 02, 2023 showed slightly improved previously treated liver lesion, and additional area of concern in the liver which are difficult to compare with previous scan due to different timing of contrast.  No other evidence of disease progression.  Plan to obtain MRI as next scan.  I personally reviewed the CT scan images and discussed the findings with patient -Will continue Lenvima, he is tolerating well overall.

## 2023-01-08 ENCOUNTER — Inpatient Hospital Stay: Payer: Commercial Managed Care - PPO | Attending: Hematology

## 2023-01-08 ENCOUNTER — Other Ambulatory Visit: Payer: Self-pay

## 2023-01-08 ENCOUNTER — Encounter: Payer: Self-pay | Admitting: Hematology

## 2023-01-08 ENCOUNTER — Inpatient Hospital Stay (HOSPITAL_BASED_OUTPATIENT_CLINIC_OR_DEPARTMENT_OTHER): Payer: Commercial Managed Care - PPO | Admitting: Hematology

## 2023-01-08 VITALS — BP 177/64 | HR 70 | Temp 97.6°F | Resp 18 | Ht 67.5 in | Wt 133.5 lb

## 2023-01-08 DIAGNOSIS — N281 Cyst of kidney, acquired: Secondary | ICD-10-CM | POA: Diagnosis not present

## 2023-01-08 DIAGNOSIS — I10 Essential (primary) hypertension: Secondary | ICD-10-CM | POA: Diagnosis not present

## 2023-01-08 DIAGNOSIS — C220 Liver cell carcinoma: Secondary | ICD-10-CM

## 2023-01-08 DIAGNOSIS — C7951 Secondary malignant neoplasm of bone: Secondary | ICD-10-CM | POA: Insufficient documentation

## 2023-01-08 DIAGNOSIS — I700 Atherosclerosis of aorta: Secondary | ICD-10-CM | POA: Diagnosis not present

## 2023-01-08 DIAGNOSIS — K7460 Unspecified cirrhosis of liver: Secondary | ICD-10-CM | POA: Diagnosis not present

## 2023-01-08 LAB — CBC WITH DIFFERENTIAL (CANCER CENTER ONLY)
Abs Immature Granulocytes: 0 10*3/uL (ref 0.00–0.07)
Basophils Absolute: 0.1 10*3/uL (ref 0.0–0.1)
Basophils Relative: 1 %
Eosinophils Absolute: 0.2 10*3/uL (ref 0.0–0.5)
Eosinophils Relative: 3 %
HCT: 43.3 % (ref 39.0–52.0)
Hemoglobin: 14.5 g/dL (ref 13.0–17.0)
Immature Granulocytes: 0 %
Lymphocytes Relative: 26 %
Lymphs Abs: 1.3 10*3/uL (ref 0.7–4.0)
MCH: 32.3 pg (ref 26.0–34.0)
MCHC: 33.5 g/dL (ref 30.0–36.0)
MCV: 96.4 fL (ref 80.0–100.0)
Monocytes Absolute: 0.4 10*3/uL (ref 0.1–1.0)
Monocytes Relative: 9 %
Neutro Abs: 3 10*3/uL (ref 1.7–7.7)
Neutrophils Relative %: 61 %
Platelet Count: 174 10*3/uL (ref 150–400)
RBC: 4.49 MIL/uL (ref 4.22–5.81)
RDW: 13.1 % (ref 11.5–15.5)
WBC Count: 4.9 10*3/uL (ref 4.0–10.5)
nRBC: 0 % (ref 0.0–0.2)

## 2023-01-08 LAB — CMP (CANCER CENTER ONLY)
ALT: 9 U/L (ref 0–44)
AST: 15 U/L (ref 15–41)
Albumin: 4.2 g/dL (ref 3.5–5.0)
Alkaline Phosphatase: 76 U/L (ref 38–126)
Anion gap: 5 (ref 5–15)
BUN: 20 mg/dL (ref 8–23)
CO2: 27 mmol/L (ref 22–32)
Calcium: 9.4 mg/dL (ref 8.9–10.3)
Chloride: 107 mmol/L (ref 98–111)
Creatinine: 1.21 mg/dL (ref 0.61–1.24)
GFR, Estimated: >60 mL/min (ref >60)
Glucose, Bld: 104 mg/dL — ABNORMAL HIGH (ref 70–99)
Potassium: 3.9 mmol/L (ref 3.5–5.1)
Sodium: 139 mmol/L (ref 135–145)
Total Bilirubin: 0.4 mg/dL (ref 0.3–1.2)
Total Protein: 7.1 g/dL (ref 6.5–8.1)

## 2023-01-08 MED ORDER — LENVATINIB (8 MG DAILY DOSE) 2 X 4 MG PO CPPK: 12.0000 mg | ORAL_CAPSULE | Freq: Every day | ORAL | 1 refills | Status: DC

## 2023-01-08 NOTE — Progress Notes (Signed)
[name]   [phone] Fax:([phone]) [phone]   Clinic Follow up Note   Patient Care Team: Venia Carbon, MD as PCP - General (Internal Medicine) Truitt Merle, MD as Attending Physician (Hematology and Oncology)  Date of Service:  01/08/2023  CHIEF COMPLAINT: f/u of Corey Armstrong  CURRENT THERAPY:  Lenvima of 8 mg daily  ASSESSMENT:  Corey Whitmoyer Sr. is a 67 y.o. male with   Hepatocellular carcinoma ([name]) -diagnosed 11/2019 by MRI for f/u of cirrhosis. S/p microwave ablation on 03/01/20. -MRI 06/01/22 showed suspicion for local recurrence and metastasis at L5. Biopsy of L5 confirmed metastatic HCC. S/p repeat microwave ablation on 07/17/22. -started lenvima 08/16/22 -staging CT CAP and bone scan on 09/19/22 showed: interval enlargement of right hepatic lobe HCC; new retropulsed bony fragment at L5; no other new lesions. His previous CT was in 05/2022 and he did not start Lenvima until 07/2022, so it's hard to evaluate if he is responding to [facility] or not, the progression is likely the nature course of his [facility]  -Restaging CT scan from January 02, 2023 showed slightly improved previously treated liver lesion, and additional area of concern in the liver which are difficult to compare with previous scan due to different timing of contrast.  No other evidence of disease progression.  Plan to obtain MRI as next scan.  I personally reviewed the CT scan images and discussed the findings with patient -Will continue Lenvima, he is tolerating well overall. -Due to his weight being above 60 kg now, I will increase his Lenvima to 12 mg daily   PLAN: -I refilled his Lenvima, will increase to 12 mg daily due to his weight gain -Lab and follow-up in 5 weeks -Plan to repeat abdominal MRI in 3 months   SUMMARY OF ONCOLOGIC HISTORY: Oncology History  Hepatocellular carcinoma ([place])  12/08/2019 Imaging   MR ABDOMEN WWO CONTRAST   IMPRESSION: Two small masses in the right  hepatic lobe which have characteristics diagnostic for hepatocellular carcinoma in the setting of cirrhosis. (LI-RADS Category 5: Definitely HCC)   No evidence of abdominal metastatic disease or other significant abnormality.   03/01/2020 Initial Diagnosis   Hepatocellular carcinoma ([name])   03/01/2020 Procedure   CT GUIDE TISSUE ABLATION     IMPRESSION: Successful percutaneous thermal ablation.   Signed,   Criselda Peaches, MD, RPVI   06/02/2020 Imaging   MR ABDOMEN WWO CONTRAST   IMPRESSION: 1. There has been interval ablation at two adjacent sites of the subcapsular right lobe of the liver, hepatic segment VI. There is no residual contrast enhancement at these adjacent sites. LI-RADS category 5 TR, nonviable. 2. There are scattered, subcentimeter foci of arterial hyperenhancement of the anterior right lobe of the liver, for example adjacent to the ablation site measuring 3 mm and in the medial anterior right lobe of the liver measuring 6 mm. This larger focus is unchanged compared to prior. Others were not previously seen. These findings are nonspecific and consistent with LI-RADS category 3. Attention on follow-up.   09/02/2020 Imaging   MR ABDOMEN WWO CONTRAST     IMPRESSION: 1. Post radiofrequency ablation along the margin of the RIGHT hemi liver. Heterogeneous appearance of this area on both T1 and T2. No sign of residual enhancement. LR TR nonviable. 2. Very subtle focus of restricted diffusion in the cephalad lateral segment LEFT hepatic lobe measuring 7 mm, corresponding to low signal on image 27 of series 21. Arterial enhancement is not seen in  this area and this areas not seen on prior imaging. Not clear whether this represents a new lesion as this area is obscured largely by artifact on most sequences on today's study and on previous imaging evaluations. LR category 3, consider a 3 to six-month follow-up with abdominal MRI. 3. Lesions of concern  scattered about the RIGHT hepatic lobe may represent perfusional anomalies as they were not seen on today's study. 4. Stable mild dilation of the biliary tree.   11/27/2020 Imaging   MR ABDOMEN WWO CONTRAST   IMPRESSION: Minimal decrease in size of ablation defect in the peripheral anterior right hepatic lobe. No evidence of locally recurrent hepatocellular carcinoma or other suspicious liver lesions.   No evidence of metastatic disease or other acute findings.   04/03/2021 Imaging   MR ABDOMEN WWO CONTRAST   IMPRESSION: Stable ablation defect in the lateral right hepatic lobe, without evidence of recurrent tumor at this site.   5 mm hypovascular lesion in segment 5 of the right lobe was not definitely seen on previous study, but is too small to characterize. This shows no arterial phase hyperenhancement. Recommend continued follow-up by MRI in 6 months.   No evidence of abdominal metastatic disease.        09/03/2021 Imaging   MR ABDOMEN WWO CONTRAST   IMPRESSION: 1. Side-by-side ablation sites in the right hepatic lobe in segment 6. The more anterior site remains largely cystic in appearance and no findings suspicious recurrent tumor in this area. There is however abnormal contrast enhancement around the larger more posterior and lateral ablation site worrisome for recurrent tumor. 2. Several small foci of early arterial phase enhancement in the liver. The peripheral lesions are likely vascular shunts. The more central lesions are likely dysplastic nodules. Recommend continued surveillance. 3. Stable bilateral renal cysts.   12/07/2021 Imaging   MR ABDOMEN WWO CONTRAST  IMPRESSION: 1. Ablation defects in the right lobe of the liver, similar to the prior study, without definitive evidence to suggest local recurrence of disease or metastatic disease. 2. Multiple tiny arterial phase areas of nodular hyperenhancement scattered throughout the liver, predominantly in  the right lobe of the liver, without perceptible signal abnormality or diffusion restriction on other pulse sequences, nonspecific, and likely small benign areas of vascular shunting. Continued attention on follow-up imaging is recommended. 3. Multiple Bosniak class 1 and Bosniak class 2 cysts in the kidneys bilaterally, similar to the prior study, as above. 4. Aortic atherosclerosis.     06/01/2022 Imaging   MR ABDOMEN WWO CONTRAST   IMPRESSION: 1. LR TR equivocal but suspicious findings with respect to previously treated disease in the RIGHT hepatic lobe given nodular enhancement adjacent and anterior superior to the ablation zone as described. 2. Given lack of late arterial phase due to bolus timing a second area of abnormality within the liver is suspicious for additional site of hepatocellular carcinoma showing washout appearance and capsule appearance on current imaging, cardiac pulsation limiting assessment in the area of concern. 3. Suspect bony metastatic disease in the lower lumbar spine at L5 level. Dedicated spinal imaging may be helpful for further evaluation.     06/18/2022 Imaging   MR Lumbar Spine W Wo Contrast   IMPRESSION: L5 vertebral body lesion is most consistent with a metastasis. There is probable trace ventral epidural extension   07/17/2022 Procedure   CT GUIDE TISSUE ABLATION   IMPRESSION: Successful CT-guided microwave ablation of recurrent hepatocellular carcinoma.   07/17/2022 Procedure   CT BONE TROCAR/NEEDLE BIOPSY  SUPERFICIAL   IMPRESSION: Successful CT-guided core biopsy of L5 bone lesion.      07/17/2022 Pathology Results   SURGICAL PATHOLOGY  CASE: [id number]  PATIENT: HARVIS RATZLAFF  Surgical Pathology Report   SURGICAL PATHOLOGY  CASE: [id number]  PATIENT: Thompson Grayer  Surgical Pathology Report      Clinical History: Hx of cirrhosis and small HCC with unexpected  suspicious lesion at L5. Rare [place] met to bone vs  atypical hemangioma or  met from new primary. (crm)      FINAL MICROSCOPIC DIAGNOSIS:   A. 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 is here for a follow up of liver cancer. He was last seen by me on October 07, 2022. He presents to the clinic alone.  He is clinically doing well, no back pain or other new pain.  He is tolerating Lenvima very well, no diarrhea, nausea, or other noticeable side effects.  He has good appetite and energy level, he has gained some weight lately.   All other systems were reviewed with the patient and are negative.  MEDICAL HISTORY:  Past Medical History:  Diagnosis Date   Cancer ([place])    Headache    migraines   Hepatitis    HX OF hEP c ?    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    SURGICAL HISTORY: Past Surgical History:  Procedure Laterality Date   IR RADIOLOGIST EVAL & MGMT  01/25/2020   IR RADIOLOGIST EVAL & MGMT  03/21/2020   IR RADIOLOGIST EVAL & MGMT  06/07/2020   IR RADIOLOGIST EVAL & MGMT  09/05/2020   IR RADIOLOGIST EVAL & MGMT  12/05/2020   IR RADIOLOGIST EVAL & MGMT  05/09/2021   IR RADIOLOGIST EVAL & MGMT  09/06/2021   IR RADIOLOGIST EVAL & MGMT  12/12/2021   IR RADIOLOGIST EVAL & MGMT  06/25/2022   IR RADIOLOGIST EVAL & MGMT  07/24/2022   RADIOLOGY WITH ANESTHESIA N/A 03/01/2020   Procedure: CT WITH ANESTHESIA  THERMAL MICROWAVE  ABLATIION;  Surgeon: Jacqulynn Cadet, MD;  Location: [facility];  Service: Anesthesiology;  Laterality: N/A;   RADIOLOGY WITH ANESTHESIA N/A 07/17/2022   Procedure: CT MICROWAVE ABLATION;  Surgeon: Criselda Peaches, MD;  Location: [facility];  Service: Radiology;  Laterality: N/A;    I have reviewed the social history and family history with the patient and they are unchanged from previous note.  ALLERGIES:  has No Known Allergies.  MEDICATIONS:   Current Outpatient Medications  Medication Sig Dispense Refill   lenvatinib 8 mg daily dose (LENVIMA, 8 MG DAILY DOSE,) 2 x 4 MG capsule Take 3 capsules (12 mg total) by mouth daily. 90 each 1   losartan-hydrochlorothiazide (HYZAAR) 100-25 MG tablet Take 0.5 tablets by mouth daily. 1 tablet 0   ondansetron (ZOFRAN) 8 MG tablet Take 1 tablet (8 mg total) by mouth every 8 (eight) hours as needed for nausea or vomiting. 30 tablet 2   prochlorperazine (COMPAZINE) 10 MG tablet Take 1 tablet (10 mg total) by mouth every 6 (six) hours as needed. 30 tablet 2   No current facility-administered medications for this visit.    PHYSICAL EXAMINATION: ECOG PERFORMANCE STATUS: 0 - Asymptomatic  Vitals:   01/08/23 1046  BP: (!) 177/64  Pulse: 70  Resp: 18  Temp: 97.6 F (36.4 C)  SpO2: 100%   Wt Readings from Last 3 Encounters:  01/08/23 133 lb 8 oz (60.6 kg)  11/18/22 131 lb (59.4 kg)  09/12/22 133 lb (60.3 kg)     GENERAL:alert, no distress and comfortable SKIN: skin color, texture, turgor are normal, no rashes or significant lesions EYES: normal, Conjunctiva are pink and non-injected, sclera clear NECK: supple, thyroid normal size, non-tender, without nodularity LYMPH:  no palpable lymphadenopathy in the cervical, axillary  LUNGS: clear to auscultation and percussion with normal breathing effort HEART: regular rate & rhythm and no murmurs and no lower extremity edema ABDOMEN:abdomen soft, non-tender and normal bowel sounds Musculoskeletal:no cyanosis of digits and no clubbing  NEURO: alert & oriented x 3 with fluent speech, no focal motor/sensory deficits  LABORATORY DATA:  I have reviewed the data as listed    Latest Ref Rng & Units 01/08/2023   10:07 AM 11/18/2022    8:52 AM 09/19/2022    8:11 AM  CBC  WBC 4.0 - 10.5 K/uL 4.9  4.6  4.9   Hemoglobin 13.0 - 17.0 g/dL 14.5  14.6  14.7   Hematocrit 39.0 - 52.0 % 43.3  43.5  43.4   Platelets 150 - 400 K/uL 174  198.0  201          Latest Ref Rng & Units 01/08/2023   10:07 AM 01/02/2023    2:08 PM 11/18/2022    8:52 AM  CMP  Glucose 70 - 99 mg/dL 104   97   BUN 8 - 23 mg/dL 20   31   Creatinine 0.61 - 1.24 mg/dL 1.21  1.30  1.26   Sodium 135 - 145 mmol/L 139   141   Potassium 3.5 - 5.1 mmol/L 3.9   4.8   Chloride 98 - 111 mmol/L 107   107   CO2 22 - 32 mmol/L 27   29   Calcium 8.9 - 10.3 mg/dL 9.4   9.7   Total Protein 6.5 - 8.1 g/dL 7.1   7.4   Total Bilirubin 0.3 - 1.2 mg/dL 0.4   0.3   Alkaline Phos 38 - 126 U/L 76   84   AST 15 - 41 U/L 15   18   ALT 0 - 44 U/L 9   11       RADIOGRAPHIC STUDIES: I have personally reviewed the radiological images as listed and agreed with the findings in the report. No results found.    No orders of the defined types were placed in this encounter.  All questions were answered. The patient knows to call the clinic with any problems, questions or concerns. No barriers to learning was detected. The total time spent in the appointment was 30 minutes.     Truitt Merle, MD 01/08/2023

## 2023-01-16 ENCOUNTER — Other Ambulatory Visit: Payer: Self-pay

## 2023-01-16 MED ORDER — LENVATINIB (12 MG DAILY DOSE) 3 X 4 MG PO CPPK: 12.0000 mg | ORAL_CAPSULE | Freq: Every day | ORAL | 1 refills | Status: DC

## 2023-02-12 ENCOUNTER — Inpatient Hospital Stay: Payer: Commercial Managed Care - PPO | Attending: Hematology | Admitting: Hematology

## 2023-02-12 ENCOUNTER — Other Ambulatory Visit: Payer: Self-pay

## 2023-02-12 ENCOUNTER — Inpatient Hospital Stay: Payer: Commercial Managed Care - PPO

## 2023-02-12 ENCOUNTER — Encounter: Payer: Self-pay | Admitting: Hematology

## 2023-02-12 VITALS — BP 145/73 | HR 67 | Temp 97.9°F | Resp 16 | Wt 130.8 lb

## 2023-02-12 DIAGNOSIS — C220 Liver cell carcinoma: Secondary | ICD-10-CM

## 2023-02-12 DIAGNOSIS — N281 Cyst of kidney, acquired: Secondary | ICD-10-CM | POA: Diagnosis not present

## 2023-02-12 DIAGNOSIS — K7460 Unspecified cirrhosis of liver: Secondary | ICD-10-CM | POA: Insufficient documentation

## 2023-02-12 DIAGNOSIS — Z79899 Other long term (current) drug therapy: Secondary | ICD-10-CM | POA: Insufficient documentation

## 2023-02-12 DIAGNOSIS — I700 Atherosclerosis of aorta: Secondary | ICD-10-CM | POA: Insufficient documentation

## 2023-02-12 LAB — CMP (CANCER CENTER ONLY)
ALT: 8 U/L (ref 0–44)
AST: 14 U/L — ABNORMAL LOW (ref 15–41)
Albumin: 4.1 g/dL (ref 3.5–5.0)
Alkaline Phosphatase: 75 U/L (ref 38–126)
Anion gap: 5 (ref 5–15)
BUN: 27 mg/dL — ABNORMAL HIGH (ref 8–23)
CO2: 26 mmol/L (ref 22–32)
Calcium: 9.6 mg/dL (ref 8.9–10.3)
Chloride: 107 mmol/L (ref 98–111)
Creatinine: 1.42 mg/dL — ABNORMAL HIGH (ref 0.61–1.24)
GFR, Estimated: 55 mL/min — ABNORMAL LOW (ref >60)
Glucose, Bld: 97 mg/dL (ref 70–99)
Potassium: 3.6 mmol/L (ref 3.5–5.1)
Sodium: 138 mmol/L (ref 135–145)
Total Bilirubin: 0.4 mg/dL (ref 0.3–1.2)
Total Protein: 7.6 g/dL (ref 6.5–8.1)

## 2023-02-12 LAB — CBC WITH DIFFERENTIAL (CANCER CENTER ONLY)
Abs Immature Granulocytes: 0.01 10*3/uL (ref 0.00–0.07)
Basophils Absolute: 0 10*3/uL (ref 0.0–0.1)
Basophils Relative: 1 %
Eosinophils Absolute: 0.1 10*3/uL (ref 0.0–0.5)
Eosinophils Relative: 1 %
HCT: 40.5 % (ref 39.0–52.0)
Hemoglobin: 13.7 g/dL (ref 13.0–17.0)
Immature Granulocytes: 0 %
Lymphocytes Relative: 28 %
Lymphs Abs: 1.6 10*3/uL (ref 0.7–4.0)
MCH: 32.3 pg (ref 26.0–34.0)
MCHC: 33.8 g/dL (ref 30.0–36.0)
MCV: 95.5 fL (ref 80.0–100.0)
Monocytes Absolute: 0.6 10*3/uL (ref 0.1–1.0)
Monocytes Relative: 10 %
Neutro Abs: 3.5 10*3/uL (ref 1.7–7.7)
Neutrophils Relative %: 60 %
Platelet Count: 172 10*3/uL (ref 150–400)
RBC: 4.24 MIL/uL (ref 4.22–5.81)
RDW: 12.9 % (ref 11.5–15.5)
WBC Count: 5.8 10*3/uL (ref 4.0–10.5)
nRBC: 0 % (ref 0.0–0.2)

## 2023-02-12 NOTE — Assessment & Plan Note (Addendum)
-  diagnosed 11/2019 by MRI for f/u of cirrhosis. S/p microwave ablation on 03/01/20. -MRI 06/01/22 showed suspicion for local recurrence and metastasis at L5. Biopsy of L5 confirmed metastatic HCC. S/p repeat microwave ablation on 07/17/22. -started lenvima 08/16/22 -staging CT CAP and bone scan on 09/19/22 showed: interval enlargement of right hepatic lobe HCC; new retropulsed bony fragment at L5; no other new lesions. His previous CT was in 05/2022 and he did not start Lenvima until 07/2022, so it's hard to evaluate if he is responding to [facility] or not, the progression is likely the nature course of his [facility]  -Restaging CT scan from January 02, 2023 showed slightly improved previously treated liver lesion, and additional area of concern in the liver which are difficult to compare with previous scan due to different timing of contrast.  No other evidence of disease progression.  Plan to obtain MRI as next scan.  I personally reviewed the CT scan images and discussed the findings with patient -Will continue Lenvima, he is tolerating well overall. -Due to his weight being above 60 kg now, I increased his Lenvima to 12 mg daily in Feb 2024 -will repeat liver MRI in 6 weeks

## 2023-02-12 NOTE — Progress Notes (Signed)
[place]   [phone] Fax:([phone]   Clinic Follow up Note   Patient Care Team: Venia Carbon, MD as PCP - General (Internal Medicine) Truitt Merle, MD as Attending Physician (Hematology and Oncology)  Date of Service:  02/12/2023  CHIEF COMPLAINT: f/u of Corey Armstrong   CURRENT THERAPY:  Lenvima of 12 mg daily   ASSESSMENT:  Corey Chihuahua Sr. is a 67 y.o. male with   Hepatocellular carcinoma ([place]) -diagnosed 11/2019 by MRI for f/u of cirrhosis. S/p microwave ablation on 03/01/20. -MRI 06/01/22 showed suspicion for local recurrence and metastasis at L5. Biopsy of L5 confirmed metastatic HCC. S/p repeat microwave ablation on 07/17/22. -started lenvima 08/16/22 -staging CT CAP and bone scan on 09/19/22 showed: interval enlargement of right hepatic lobe HCC; new retropulsed bony fragment at L5; no other new lesions. His previous CT was in 05/2022 and he did not start Lenvima until 07/2022, so it's hard to evaluate if he is responding to [facility] or not, the progression is likely the nature course of his [facility]  -Restaging CT scan from January 02, 2023 showed slightly improved previously treated liver lesion, and additional area of concern in the liver which are difficult to compare with previous scan due to different timing of contrast.  No other evidence of disease progression.  Plan to obtain MRI as next scan.  I personally reviewed the CT scan images and discussed the findings with patient -Will continue Lenvima, he is tolerating well overall. -Due to his weight being above 60 kg now, I increased his Lenvima to 12 mg daily in Feb 2024 -will repeat liver MRI in 2 months     PLAN: -lab reviewed -Recommend taking OTC Melatonin for insomnia  -Continue taking 3tablets of the [place] -I order MR in June -lab in 1 month  -f/u in 2.5 months with lab and MRI a few days before   SUMMARY OF ONCOLOGIC HISTORY: Oncology History  Hepatocellular carcinoma  12/08/2019  Imaging   MR ABDOMEN WWO CONTRAST   IMPRESSION: Two small masses in the right hepatic lobe which have characteristics diagnostic for hepatocellular carcinoma in the setting of cirrhosis. (LI-RADS Category 5: Definitely HCC)   No evidence of abdominal metastatic disease or other significant abnormality.   03/01/2020 Initial Diagnosis   Hepatocellular carcinoma ([place])   03/01/2020 Procedure   CT GUIDE TISSUE ABLATION     IMPRESSION: Successful percutaneous thermal ablation.   Signed,   Criselda Peaches, MD, RPVI   06/02/2020 Imaging   MR ABDOMEN WWO CONTRAST   IMPRESSION: 1. There has been interval ablation at two adjacent sites of the subcapsular right lobe of the liver, hepatic segment VI. There is no residual contrast enhancement at these adjacent sites. LI-RADS category 5 TR, nonviable. 2. There are scattered, subcentimeter foci of arterial hyperenhancement of the anterior right lobe of the liver, for example adjacent to the ablation site measuring 3 mm and in the medial anterior right lobe of the liver measuring 6 mm. This larger focus is unchanged compared to prior. Others were not previously seen. These findings are nonspecific and consistent with LI-RADS category 3. Attention on follow-up.   09/02/2020 Imaging   MR ABDOMEN WWO CONTRAST     IMPRESSION: 1. Post radiofrequency ablation along the margin of the RIGHT hemi liver. Heterogeneous appearance of this area on both T1 and T2. No sign of residual enhancement. LR TR nonviable. 2. Very subtle focus of restricted diffusion in the cephalad lateral segment LEFT hepatic  lobe measuring 7 mm, corresponding to low signal on image 27 of series 21. Arterial enhancement is not seen in this area and this areas not seen on prior imaging. Not clear whether this represents a new lesion as this area is obscured largely by artifact on most sequences on today's study and on previous imaging evaluations. LR category 3,  consider a 3 to six-month follow-up with abdominal MRI. 3. Lesions of concern scattered about the RIGHT hepatic lobe may represent perfusional anomalies as they were not seen on today's study. 4. Stable mild dilation of the biliary tree.   11/27/2020 Imaging   MR ABDOMEN WWO CONTRAST   IMPRESSION: Minimal decrease in size of ablation defect in the peripheral anterior right hepatic lobe. No evidence of locally recurrent hepatocellular carcinoma or other suspicious liver lesions.   No evidence of metastatic disease or other acute findings.   04/03/2021 Imaging   MR ABDOMEN WWO CONTRAST   IMPRESSION: Stable ablation defect in the lateral right hepatic lobe, without evidence of recurrent tumor at this site.   5 mm hypovascular lesion in segment 5 of the right lobe was not definitely seen on previous study, but is too small to characterize. This shows no arterial phase hyperenhancement. Recommend continued follow-up by MRI in 6 months.   No evidence of abdominal metastatic disease.        09/03/2021 Imaging   MR ABDOMEN WWO CONTRAST   IMPRESSION: 1. Side-by-side ablation sites in the right hepatic lobe in segment 6. The more anterior site remains largely cystic in appearance and no findings suspicious recurrent tumor in this area. There is however abnormal contrast enhancement around the larger more posterior and lateral ablation site worrisome for recurrent tumor. 2. Several small foci of early arterial phase enhancement in the liver. The peripheral lesions are likely vascular shunts. The more central lesions are likely dysplastic nodules. Recommend continued surveillance. 3. Stable bilateral renal cysts.   12/07/2021 Imaging   MR ABDOMEN WWO CONTRAST  IMPRESSION: 1. Ablation defects in the right lobe of the liver, similar to the prior study, without definitive evidence to suggest local recurrence of disease or metastatic disease. 2. Multiple tiny arterial phase areas  of nodular hyperenhancement scattered throughout the liver, predominantly in the right lobe of the liver, without perceptible signal abnormality or diffusion restriction on other pulse sequences, nonspecific, and likely small benign areas of vascular shunting. Continued attention on follow-up imaging is recommended. 3. Multiple Bosniak class 1 and Bosniak class 2 cysts in the kidneys bilaterally, similar to the prior study, as above. 4. Aortic atherosclerosis.     06/01/2022 Imaging   MR ABDOMEN WWO CONTRAST   IMPRESSION: 1. LR TR equivocal but suspicious findings with respect to previously treated disease in the RIGHT hepatic lobe given nodular enhancement adjacent and anterior superior to the ablation zone as described. 2. Given lack of late arterial phase due to bolus timing a second area of abnormality within the liver is suspicious for additional site of hepatocellular carcinoma showing washout appearance and capsule appearance on current imaging, cardiac pulsation limiting assessment in the area of concern. 3. Suspect bony metastatic disease in the lower lumbar spine at L5 level. Dedicated spinal imaging may be helpful for further evaluation.     06/18/2022 Imaging   MR Lumbar Spine W Wo Contrast   IMPRESSION: L5 vertebral body lesion is most consistent with a metastasis. There is probable trace ventral epidural extension   07/17/2022 Procedure   CT GUIDE TISSUE ABLATION  IMPRESSION: Successful CT-guided microwave ablation of recurrent hepatocellular carcinoma.   07/17/2022 Procedure   CT BONE TROCAR/NEEDLE BIOPSY SUPERFICIAL   IMPRESSION: Successful CT-guided core biopsy of L5 bone lesion.      07/17/2022 Pathology Results   SURGICAL PATHOLOGY  CASE: [id number]  PATIENT: JUAQUIN BURLIN  Surgical Pathology Report   SURGICAL PATHOLOGY  CASE: [id number]  PATIENT: Thompson Grayer  Surgical Pathology Report      Clinical History: Hx of cirrhosis and  small HCC with unexpected  suspicious lesion at L5. Rare [place] met to bone vs atypical hemangioma or  met from new primary. (crm)      FINAL MICROSCOPIC DIAGNOSIS:   A. 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 is here for a follow up of [place].He was last seen by me on 01/08/2023 He presents to the clinic alone. Pt denies having diarrhea . Pt has no concerns.     All other systems were reviewed with the patient and are negative.  MEDICAL HISTORY:  Past Medical History:  Diagnosis Date   Cancer    Headache    migraines   Hepatitis    HX OF hEP c ?    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    SURGICAL HISTORY: Past Surgical History:  Procedure Laterality Date   IR RADIOLOGIST EVAL & MGMT  01/25/2020   IR RADIOLOGIST EVAL & MGMT  03/21/2020   IR RADIOLOGIST EVAL & MGMT  06/07/2020   IR RADIOLOGIST EVAL & MGMT  09/05/2020   IR RADIOLOGIST EVAL & MGMT  12/05/2020   IR RADIOLOGIST EVAL & MGMT  05/09/2021   IR RADIOLOGIST EVAL & MGMT  09/06/2021   IR RADIOLOGIST EVAL & MGMT  12/12/2021   IR RADIOLOGIST EVAL & MGMT  06/25/2022   IR RADIOLOGIST EVAL & MGMT  07/24/2022   RADIOLOGY WITH ANESTHESIA N/A 03/01/2020   Procedure: CT WITH ANESTHESIA  THERMAL MICROWAVE  ABLATIION;  Surgeon: Jacqulynn Cadet, MD;  Location: [facility];  Service: Anesthesiology;  Laterality: N/A;   RADIOLOGY WITH ANESTHESIA N/A 07/17/2022   Procedure: CT MICROWAVE ABLATION;  Surgeon: Criselda Peaches, MD;  Location: [facility];  Service: Radiology;  Laterality: N/A;    I have reviewed the social history and family history with the patient and they are unchanged from previous note.  ALLERGIES:  has No Known Allergies.  MEDICATIONS:  Current Outpatient Medications  Medication Sig Dispense Refill   lenvatinib 12 mg daily dose (LENVIMA, 12 MG DAILY DOSE,) 3 x  4 MG capsule Take 3 capsules (12 mg total) by mouth daily. 90 capsule 1   losartan-hydrochlorothiazide (HYZAAR) 100-25 MG tablet Take 0.5 tablets by mouth daily. 1 tablet 0   ondansetron (ZOFRAN) 8 MG tablet Take 1 tablet (8 mg total) by mouth every 8 (eight) hours as needed for nausea or vomiting. 30 tablet 2   prochlorperazine (COMPAZINE) 10 MG tablet Take 1 tablet (10 mg total) by mouth every 6 (six) hours as needed. 30 tablet 2   No current facility-administered medications for this visit.    PHYSICAL EXAMINATION: ECOG PERFORMANCE STATUS: 0 - Asymptomatic  Vitals:   02/12/23 1320  BP: (!) 145/73  Pulse: 67  Resp: 16  Temp: 97.9 F (36.6 C)  SpO2: 100%   Wt Readings from Last 3 Encounters:  02/12/23 130 lb 12.8 oz (59.3  kg)  01/08/23 133 lb 8 oz (60.6 kg)  11/18/22 131 lb (59.4 kg)     HEART: regular rate & rhythm and no murmurs and (-) no lower extremity edema ABDOMEN(-):abdomen soft, (-) non-tender and (-) normal bowel sounds  LABORATORY DATA:  I have reviewed the data as listed    Latest Ref Rng & Units 02/12/2023   12:48 PM 01/08/2023   10:07 AM 11/18/2022    8:52 AM  CBC  WBC 4.0 - 10.5 K/uL 5.8  4.9  4.6   Hemoglobin 13.0 - 17.0 g/dL 13.7  14.5  14.6   Hematocrit 39.0 - 52.0 % 40.5  43.3  43.5   Platelets 150 - 400 K/uL 172  174  198.0         Latest Ref Rng & Units 02/12/2023   12:48 PM 01/08/2023   10:07 AM 01/02/2023    2:08 PM  CMP  Glucose 70 - 99 mg/dL 97  104    BUN 8 - 23 mg/dL 27  20    Creatinine 0.61 - 1.24 mg/dL 1.42  1.21  1.30   Sodium 135 - 145 mmol/L 138  139    Potassium 3.5 - 5.1 mmol/L 3.6  3.9    Chloride 98 - 111 mmol/L 107  107    CO2 22 - 32 mmol/L 26  27    Calcium 8.9 - 10.3 mg/dL 9.6  9.4    Total Protein 6.5 - 8.1 g/dL 7.6  7.1    Total Bilirubin 0.3 - 1.2 mg/dL 0.4  0.4    Alkaline Phos 38 - 126 U/L 75  76    AST 15 - 41 U/L 14  15    ALT 0 - 44 U/L 8  9        RADIOGRAPHIC STUDIES: I have personally reviewed the  radiological images as listed and agreed with the findings in the report. No results found.    Orders Placed This Encounter  Procedures   MR Abdomen W Wo Contrast    Standing Status:   Future    Standing Expiration Date:   02/12/2024    Order Specific Question:   If indicated for the ordered procedure, I authorize the administration of contrast media per Radiology protocol    Answer:   Yes    Order Specific Question:   What is the patient's sedation requirement?    Answer:   No Sedation    Order Specific Question:   Does the patient have a pacemaker or implanted devices?    Answer:   No    Order Specific Question:   Preferred imaging location?    Answer:   [facility] (table limit - 550 lbs)   All questions were answered. The patient knows to call the clinic with any problems, questions or concerns. No barriers to learning was detected. The total time spent in the appointment was 30 minutes.     Truitt Merle, MD 02/12/2023   Felicity Coyer, CMA, am acting as scribe for Truitt Merle, MD.   I have reviewed the above documentation for accuracy and completeness, and I agree with the above.

## 2023-02-26 ENCOUNTER — Other Ambulatory Visit: Payer: Self-pay | Admitting: Hematology

## 2023-03-14 ENCOUNTER — Inpatient Hospital Stay: Payer: Commercial Managed Care - PPO | Attending: Hematology

## 2023-03-14 ENCOUNTER — Other Ambulatory Visit: Payer: Self-pay

## 2023-03-14 DIAGNOSIS — C220 Liver cell carcinoma: Secondary | ICD-10-CM | POA: Insufficient documentation

## 2023-03-14 LAB — CBC WITH DIFFERENTIAL (CANCER CENTER ONLY)
Abs Immature Granulocytes: 0.02 10*3/uL (ref 0.00–0.07)
Basophils Absolute: 0 10*3/uL (ref 0.0–0.1)
Basophils Relative: 1 %
Eosinophils Absolute: 0.1 10*3/uL (ref 0.0–0.5)
Eosinophils Relative: 2 %
HCT: 44.6 % (ref 39.0–52.0)
Hemoglobin: 14.7 g/dL (ref 13.0–17.0)
Immature Granulocytes: 0 %
Lymphocytes Relative: 20 %
Lymphs Abs: 1.3 10*3/uL (ref 0.7–4.0)
MCH: 32.2 pg (ref 26.0–34.0)
MCHC: 33 g/dL (ref 30.0–36.0)
MCV: 97.6 fL (ref 80.0–100.0)
Monocytes Absolute: 0.6 10*3/uL (ref 0.1–1.0)
Monocytes Relative: 9 %
Neutro Abs: 4.4 10*3/uL (ref 1.7–7.7)
Neutrophils Relative %: 68 %
Platelet Count: 170 10*3/uL (ref 150–400)
RBC: 4.57 MIL/uL (ref 4.22–5.81)
RDW: 13.1 % (ref 11.5–15.5)
WBC Count: 6.5 10*3/uL (ref 4.0–10.5)
nRBC: 0 % (ref 0.0–0.2)

## 2023-03-14 LAB — CMP (CANCER CENTER ONLY)
ALT: 11 U/L (ref 0–44)
AST: 16 U/L (ref 15–41)
Albumin: 4.5 g/dL (ref 3.5–5.0)
Alkaline Phosphatase: 78 U/L (ref 38–126)
Anion gap: 3 — ABNORMAL LOW (ref 5–15)
BUN: 19 mg/dL (ref 8–23)
CO2: 31 mmol/L (ref 22–32)
Calcium: 10.1 mg/dL (ref 8.9–10.3)
Chloride: 107 mmol/L (ref 98–111)
Creatinine: 1.35 mg/dL — ABNORMAL HIGH (ref 0.61–1.24)
GFR, Estimated: 59 mL/min — ABNORMAL LOW (ref >60)
Glucose, Bld: 99 mg/dL (ref 70–99)
Potassium: 4.7 mmol/L (ref 3.5–5.1)
Sodium: 141 mmol/L (ref 135–145)
Total Bilirubin: 0.3 mg/dL (ref 0.3–1.2)
Total Protein: 8 g/dL (ref 6.5–8.1)

## 2023-04-17 ENCOUNTER — Other Ambulatory Visit: Payer: Self-pay | Admitting: Hematology

## 2023-04-22 NOTE — Assessment & Plan Note (Deleted)
-  diagnosed 11/2019 by MRI for f/u of cirrhosis. S/p microwave ablation on 03/01/20. -MRI 06/01/22 showed suspicion for local recurrence and metastasis at L5. Biopsy of L5 confirmed metastatic HCC. S/p repeat microwave ablation on 07/17/22. -started lenvima 08/16/22 -staging CT CAP and bone scan on 09/19/22 showed: interval enlargement of right hepatic lobe HCC; new retropulsed bony fragment at L5; no other new lesions. His previous CT was in 05/2022 and he did not start Lenvima until 07/2022, so it's hard to evaluate if he is responding to [facility] or not, the progression is likely the nature course of his [facility]  -Restaging CT scan from January 02, 2023 showed slightly improved previously treated liver lesion, and additional area of concern in the liver which are difficult to compare with previous scan due to different timing of contrast.  No other evidence of disease progression.  Plan to obtain MRI as next scan.  I personally reviewed the CT scan images and discussed the findings with patient -Will continue Lenvima, he is tolerating well overall. -Due to his weight being above 60 kg now, I increased his Lenvima to 12 mg daily in Feb 2024

## 2023-04-22 NOTE — Progress Notes (Deleted)
[facility]   Telephone:(336) [phone] Fax:(336) [phone]   Clinic Follow up Note   Patient Care Team: [name], MD as PCP - General (Internal Medicine) Malachy Mood, MD as Attending Physician (Hematology and Oncology)  Date of Service:  04/22/2023  CHIEF COMPLAINT: f/u of HCC   CURRENT THERAPY:  Lenvima of 8 mg daily   ASSESSMENT: *** Corey Der Sr. is a 67 y.o. male with   No problem-specific Assessment & Plan notes found for this encounter.  ***   PLAN:    SUMMARY OF ONCOLOGIC HISTORY: Oncology History  Hepatocellular carcinoma (HCC)  12/08/2019 Imaging   MR ABDOMEN WWO CONTRAST   IMPRESSION: Two small masses in the right hepatic lobe which have characteristics diagnostic for hepatocellular carcinoma in the setting of cirrhosis. (LI-RADS Category 5: Definitely HCC)   No evidence of abdominal metastatic disease or other significant abnormality.   03/01/2020 Initial Diagnosis   Hepatocellular carcinoma (HCC)   03/01/2020 Procedure   CT GUIDE TISSUE ABLATION     IMPRESSION: Successful percutaneous thermal ablation.   Signed,   [name], MD, RPVI   06/02/2020 Imaging   MR ABDOMEN WWO CONTRAST   IMPRESSION: 1. There has been interval ablation at two adjacent sites of the subcapsular right lobe of the liver, hepatic segment VI. There is no residual contrast enhancement at these adjacent sites. LI-RADS category 5 TR, nonviable. 2. There are scattered, subcentimeter foci of arterial hyperenhancement of the anterior right lobe of the liver, for example adjacent to the ablation site measuring 3 mm and in the medial anterior right lobe of the liver measuring 6 mm. This larger focus is unchanged compared to prior. Others were not previously seen. These findings are nonspecific and consistent with LI-RADS category 3. Attention on follow-up.   09/02/2020 Imaging   MR ABDOMEN WWO CONTRAST     IMPRESSION: 1. Post  radiofrequency ablation along the margin of the RIGHT hemi liver. Heterogeneous appearance of this area on both T1 and T2. No sign of residual enhancement. LR TR nonviable. 2. Very subtle focus of restricted diffusion in the cephalad lateral segment LEFT hepatic lobe measuring 7 mm, corresponding to low signal on image 27 of series 21. Arterial enhancement is not seen in this area and this areas not seen on prior imaging. Not clear whether this represents a new lesion as this area is obscured largely by artifact on most sequences on today's study and on previous imaging evaluations. LR category 3, consider a 3 to six-month follow-up with abdominal MRI. 3. Lesions of concern scattered about the RIGHT hepatic lobe may represent perfusional anomalies as they were not seen on today's study. 4. Stable mild dilation of the biliary tree.   11/27/2020 Imaging   MR ABDOMEN WWO CONTRAST   IMPRESSION: Minimal decrease in size of ablation defect in the peripheral anterior right hepatic lobe. No evidence of locally recurrent hepatocellular carcinoma or other suspicious liver lesions.   No evidence of metastatic disease or other acute findings.   04/03/2021 Imaging   MR ABDOMEN WWO CONTRAST   IMPRESSION: Stable ablation defect in the lateral right hepatic lobe, without evidence of recurrent tumor at this site.   5 mm hypovascular lesion in segment 5 of the right lobe was not definitely seen on previous study, but is too small to characterize. This shows no arterial phase hyperenhancement. Recommend continued follow-up by MRI in 6 months.   No evidence of abdominal metastatic disease.  09/03/2021 Imaging   MR ABDOMEN WWO CONTRAST   IMPRESSION: 1. Side-by-side ablation sites in the right hepatic lobe in segment 6. The more anterior site remains largely cystic in appearance and no findings suspicious recurrent tumor in this area. There is however abnormal contrast enhancement  around the larger more posterior and lateral ablation site worrisome for recurrent tumor. 2. Several small foci of early arterial phase enhancement in the liver. The peripheral lesions are likely vascular shunts. The more central lesions are likely dysplastic nodules. Recommend continued surveillance. 3. Stable bilateral renal cysts.   12/07/2021 Imaging   MR ABDOMEN WWO CONTRAST  IMPRESSION: 1. Ablation defects in the right lobe of the liver, similar to the prior study, without definitive evidence to suggest local recurrence of disease or metastatic disease. 2. Multiple tiny arterial phase areas of nodular hyperenhancement scattered throughout the liver, predominantly in the right lobe of the liver, without perceptible signal abnormality or diffusion restriction on other pulse sequences, nonspecific, and likely small benign areas of vascular shunting. Continued attention on follow-up imaging is recommended. 3. Multiple Bosniak class 1 and Bosniak class 2 cysts in the kidneys bilaterally, similar to the prior study, as above. 4. Aortic atherosclerosis.     06/01/2022 Imaging   MR ABDOMEN WWO CONTRAST   IMPRESSION: 1. LR TR equivocal but suspicious findings with respect to previously treated disease in the RIGHT hepatic lobe given nodular enhancement adjacent and anterior superior to the ablation zone as described. 2. Given lack of late arterial phase due to bolus timing a second area of abnormality within the liver is suspicious for additional site of hepatocellular carcinoma showing washout appearance and capsule appearance on current imaging, cardiac pulsation limiting assessment in the area of concern. 3. Suspect bony metastatic disease in the lower lumbar spine at L5 level. Dedicated spinal imaging may be helpful for further evaluation.     06/18/2022 Imaging   MR Lumbar Spine W Wo Contrast   IMPRESSION: L5 vertebral body lesion is most consistent with a metastasis.  There is probable trace ventral epidural extension   07/17/2022 Procedure   CT GUIDE TISSUE ABLATION   IMPRESSION: Successful CT-guided microwave ablation of recurrent hepatocellular carcinoma.   07/17/2022 Procedure   CT BONE TROCAR/NEEDLE BIOPSY SUPERFICIAL   IMPRESSION: Successful CT-guided core biopsy of L5 bone lesion.      07/17/2022 Pathology Results   SURGICAL PATHOLOGY  CASE: [id number]  PATIENT: LAITHEN ADIE  Surgical Pathology Report   SURGICAL PATHOLOGY  CASE: [id number]  PATIENT: Malachy Mood  Surgical Pathology Report      Clinical History: Hx of cirrhosis and small HCC with unexpected  suspicious lesion at L5. Rare HCC met to bone vs atypical hemangioma or  met from new primary. (crm)      FINAL MICROSCOPIC DIAGNOSIS:   A. 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 is here for a follow up of HCC. He was last seen by me on 01/08/2023. He presents to the clinic       All other systems were reviewed with the patient and are negative.  MEDICAL HISTORY:  Past Medical History:  Diagnosis Date   Cancer (HCC)    Headache    migraines   Hepatitis    HX OF hEP c ?    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  SURGICAL HISTORY: Past Surgical History:  Procedure Laterality Date   IR RADIOLOGIST EVAL & MGMT  01/25/2020   IR RADIOLOGIST EVAL & MGMT  03/21/2020   IR RADIOLOGIST EVAL & MGMT  06/07/2020   IR RADIOLOGIST EVAL & MGMT  09/05/2020   IR RADIOLOGIST EVAL & MGMT  12/05/2020   IR RADIOLOGIST EVAL & MGMT  05/09/2021   IR RADIOLOGIST EVAL & MGMT  09/06/2021   IR RADIOLOGIST EVAL & MGMT  12/12/2021   IR RADIOLOGIST EVAL & MGMT  06/25/2022   IR RADIOLOGIST EVAL & MGMT  07/24/2022   RADIOLOGY WITH ANESTHESIA N/A 03/01/2020   Procedure: CT WITH ANESTHESIA  THERMAL MICROWAVE   ABLATIION;  Surgeon: [name], MD;  Location: [facility];  Service: Anesthesiology;  Laterality: N/A;   RADIOLOGY WITH ANESTHESIA N/A 07/17/2022   Procedure: CT MICROWAVE ABLATION;  Surgeon: [name], MD;  Location: [facility];  Service: Radiology;  Laterality: N/A;    I have reviewed the social history and family history with the patient and they are unchanged from previous note.  ALLERGIES:  has No Known Allergies.  MEDICATIONS:  Current Outpatient Medications  Medication Sig Dispense Refill   LENVIMA, 12 MG DAILY DOSE, 3 x 4 MG capsule TAKE 12 MG (3 X 4 MG CAPSULES) BY MOUTH 1 TIME A DAY 90 each 1   losartan-hydrochlorothiazide (HYZAAR) 100-25 MG tablet Take 0.5 tablets by mouth daily. 1 tablet 0   ondansetron (ZOFRAN) 8 MG tablet Take 1 tablet (8 mg total) by mouth every 8 (eight) hours as needed for nausea or vomiting. 30 tablet 2   prochlorperazine (COMPAZINE) 10 MG tablet Take 1 tablet (10 mg total) by mouth every 6 (six) hours as needed. 30 tablet 2   No current facility-administered medications for this visit.    PHYSICAL EXAMINATION: ECOG PERFORMANCE STATUS: {CHL ONC ECOG PS:[phone]}  There were no vitals filed for this visit. Wt Readings from Last 3 Encounters:  02/12/23 130 lb 12.8 oz (59.3 kg)  01/08/23 133 lb 8 oz (60.6 kg)  11/18/22 131 lb (59.4 kg)    {Only keep what was examined. If exam not performed, can use .CEXAM } GENERAL:alert, no distress and comfortable SKIN: skin color, texture, turgor are normal, no rashes or significant lesions EYES: normal, Conjunctiva are pink and non-injected, sclera clear {OROPHARYNX:no exudate, no erythema and lips, buccal mucosa, and tongue normal}  NECK: supple, thyroid normal size, non-tender, without nodularity LYMPH:  no palpable lymphadenopathy in the cervical, axillary {or inguinal} LUNGS: clear to auscultation and percussion with normal breathing effort HEART: regular rate & rhythm and no murmurs and no lower  extremity edema ABDOMEN:abdomen soft, non-tender and normal bowel sounds Musculoskeletal:no cyanosis of digits and no clubbing  NEURO: alert & oriented x 3 with fluent speech, no focal motor/sensory deficits  LABORATORY DATA:  I have reviewed the data as listed    Latest Ref Rng & Units 03/14/2023   10:02 AM 02/12/2023   12:48 PM 01/08/2023   10:07 AM  CBC  WBC 4.0 - 10.5 K/uL 6.5  5.8  4.9   Hemoglobin 13.0 - 17.0 g/dL 14.7  82.9  56.2   Hematocrit 39.0 - 52.0 % 44.6  40.5  43.3   Platelets 150 - 400 K/uL 170  172  174         Latest Ref Rng & Units 03/14/2023   10:02 AM 02/12/2023   12:48 PM 01/08/2023   10:07 AM  CMP  Glucose 70 - 99  mg/dL 99  97  161   BUN 8 - 23 mg/dL 19  27  20    Creatinine 0.61 - 1.24 mg/dL 0.96  0.45  4.09   Sodium 135 - 145 mmol/L 141  138  139   Potassium 3.5 - 5.1 mmol/L 4.7  3.6  3.9   Chloride 98 - 111 mmol/L 107  107  107   CO2 22 - 32 mmol/L 31  26  27    Calcium 8.9 - 10.3 mg/dL 81.1  9.6  9.4   Total Protein 6.5 - 8.1 g/dL 8.0  7.6  7.1   Total Bilirubin 0.3 - 1.2 mg/dL 0.3  0.4  0.4   Alkaline Phos 38 - 126 U/L 78  75  76   AST 15 - 41 U/L 16  14  15    ALT 0 - 44 U/L 11  8  9        RADIOGRAPHIC STUDIES: I have personally reviewed the radiological images as listed and agreed with the findings in the report. No results found.    No orders of the defined types were placed in this encounter.  All questions were answered. The patient knows to call the clinic with any problems, questions or concerns. No barriers to learning was detected. The total time spent in the appointment was {CHL ONC TIME VISIT - BJYNW:[id number]}.     Salome Holmes, CMA 04/22/2023   I, Monica Martinez, CMA, am acting as scribe for Malachy Mood, MD.   {Add scribe attestation statement}

## 2023-04-23 ENCOUNTER — Ambulatory Visit (HOSPITAL_COMMUNITY)
Admission: RE | Admit: 2023-04-23 | Discharge: 2023-04-23 | Disposition: A | Discharge status: Home / Self Care | Payer: Commercial Managed Care - PPO | Source: Ambulatory Visit | Attending: Hematology | Admitting: Hematology

## 2023-04-23 ENCOUNTER — Inpatient Hospital Stay: Payer: Commercial Managed Care - PPO | Admitting: Hematology

## 2023-04-23 ENCOUNTER — Inpatient Hospital Stay: Payer: Commercial Managed Care - PPO

## 2023-04-23 DIAGNOSIS — C220 Liver cell carcinoma: Secondary | ICD-10-CM | POA: Insufficient documentation

## 2023-04-23 MED ORDER — GADOBUTROL 1 MMOL/ML IV SOLN
6.0000 mL | Freq: Once | INTRAVENOUS | Status: AC | PRN
  Administered 2023-04-23: 6 mL via INTRAVENOUS

## 2023-04-29 ENCOUNTER — Inpatient Hospital Stay: Payer: Commercial Managed Care - PPO | Admitting: Hematology

## 2023-04-29 ENCOUNTER — Inpatient Hospital Stay: Payer: Commercial Managed Care - PPO | Attending: Hematology

## 2023-04-29 ENCOUNTER — Encounter: Payer: Self-pay | Admitting: Hematology

## 2023-04-29 ENCOUNTER — Other Ambulatory Visit: Payer: Self-pay

## 2023-04-29 VITALS — BP 161/60 | HR 69 | Temp 97.7°F | Resp 16 | Ht 67.5 in | Wt 131.9 lb

## 2023-04-29 DIAGNOSIS — N281 Cyst of kidney, acquired: Secondary | ICD-10-CM | POA: Diagnosis not present

## 2023-04-29 DIAGNOSIS — C220 Liver cell carcinoma: Secondary | ICD-10-CM | POA: Diagnosis present

## 2023-04-29 DIAGNOSIS — K7460 Unspecified cirrhosis of liver: Secondary | ICD-10-CM | POA: Insufficient documentation

## 2023-04-29 DIAGNOSIS — I10 Essential (primary) hypertension: Secondary | ICD-10-CM | POA: Insufficient documentation

## 2023-04-29 DIAGNOSIS — Z79899 Other long term (current) drug therapy: Secondary | ICD-10-CM | POA: Insufficient documentation

## 2023-04-29 DIAGNOSIS — C7951 Secondary malignant neoplasm of bone: Secondary | ICD-10-CM | POA: Insufficient documentation

## 2023-04-29 LAB — CBC WITH DIFFERENTIAL (CANCER CENTER ONLY)
Abs Immature Granulocytes: 0 10*3/uL (ref 0.00–0.07)
Basophils Absolute: 0 10*3/uL (ref 0.0–0.1)
Basophils Relative: 1 %
Eosinophils Absolute: 0.2 10*3/uL (ref 0.0–0.5)
Eosinophils Relative: 3 %
HCT: 44.9 % (ref 39.0–52.0)
Hemoglobin: 14.6 g/dL (ref 13.0–17.0)
Immature Granulocytes: 0 %
Lymphocytes Relative: 31 %
Lymphs Abs: 1.7 10*3/uL (ref 0.7–4.0)
MCH: 31.7 pg (ref 26.0–34.0)
MCHC: 32.5 g/dL (ref 30.0–36.0)
MCV: 97.6 fL (ref 80.0–100.0)
Monocytes Absolute: 0.6 10*3/uL (ref 0.1–1.0)
Monocytes Relative: 12 %
Neutro Abs: 2.8 10*3/uL (ref 1.7–7.7)
Neutrophils Relative %: 53 %
Platelet Count: 174 10*3/uL (ref 150–400)
RBC: 4.6 MIL/uL (ref 4.22–5.81)
RDW: 13.1 % (ref 11.5–15.5)
WBC Count: 5.3 10*3/uL (ref 4.0–10.5)
nRBC: 0 % (ref 0.0–0.2)

## 2023-04-29 LAB — CMP (CANCER CENTER ONLY)
ALT: 12 U/L (ref 0–44)
AST: 18 U/L (ref 15–41)
Albumin: 4.1 g/dL (ref 3.5–5.0)
Alkaline Phosphatase: 68 U/L (ref 38–126)
Anion gap: 6 (ref 5–15)
BUN: 25 mg/dL — ABNORMAL HIGH (ref 8–23)
CO2: 25 mmol/L (ref 22–32)
Calcium: 9.8 mg/dL (ref 8.9–10.3)
Chloride: 109 mmol/L (ref 98–111)
Creatinine: 1.26 mg/dL — ABNORMAL HIGH (ref 0.61–1.24)
GFR, Estimated: >60 mL/min (ref >60)
Glucose, Bld: 102 mg/dL — ABNORMAL HIGH (ref 70–99)
Potassium: 4.3 mmol/L (ref 3.5–5.1)
Sodium: 140 mmol/L (ref 135–145)
Total Bilirubin: 0.4 mg/dL (ref 0.3–1.2)
Total Protein: 7.3 g/dL (ref 6.5–8.1)

## 2023-04-29 NOTE — Progress Notes (Signed)
[facility]   Telephone:(336) [phone] Fax:(336) [phone]   Clinic Follow up Note   Patient Care Team: [name], MD as PCP - General (Internal Medicine) Malachy Mood, MD as Attending Physician (Hematology and Oncology)  Date of Service:  04/29/2023  CHIEF COMPLAINT: f/u of HCC   CURRENT THERAPY:  Lenvima of 12 mg daily   ASSESSMENT:  Corey Der Sr. is a 67 y.o. male with   Hepatocellular carcinoma (HCC), with oligo bone mets at L5 diagnosed 11/2019 by MRI for f/u of cirrhosis. S/p microwave ablation on 03/01/20. -MRI 06/01/22 showed suspicion for local recurrence and metastasis at L5. Biopsy of L5 confirmed metastatic HCC. S/p repeat microwave ablation on 07/17/22. -started lenvima 08/16/22 -staging CT CAP and bone scan on 09/19/22 showed: interval enlargement of right hepatic lobe HCC; new retropulsed bony fragment at L5; no other new lesions. His previous CT was in 05/2022 and he did not start Lenvima until 07/2022, so it's hard to evaluate if he is responding to [facility] or not, the progression is likely the nature course of his [facility]  -Restaging CT scan from January 02, 2023 showed slightly improved previously treated liver lesion, and additional area of concern in the liver which are difficult to compare with previous scan due to different timing of contrast.  No other evidence of disease progression.  Plan to obtain MRI as next scan.  I personally reviewed the CT scan images and discussed the findings with patient -Will continue Lenvima, he is tolerating well overall. -Due to his weight being above 60 kg now, I increased his Lenvima to 12 mg daily in Feb 2024 -repeated liver MRI on 04/23/2023 showed stable treated liver lesion, no new lesions, on my view, the formal report is not back yet. -He is clinically doing very well, asymptomatic, tolerating Lenvima very well, no noticeable side effect, will continue -Follow-up in 3 months, next scan in 6  months     PLAN: -lab reviewed  -CT SCAN -pending Lab and f/u in 3 months   SUMMARY OF ONCOLOGIC HISTORY: Oncology History  Hepatocellular carcinoma (HCC)  12/08/2019 Imaging   MR ABDOMEN WWO CONTRAST   IMPRESSION: Two small masses in the right hepatic lobe which have characteristics diagnostic for hepatocellular carcinoma in the setting of cirrhosis. (LI-RADS Category 5: Definitely HCC)   No evidence of abdominal metastatic disease or other significant abnormality.   03/01/2020 Initial Diagnosis   Hepatocellular carcinoma (HCC)   03/01/2020 Procedure   CT GUIDE TISSUE ABLATION     IMPRESSION: Successful percutaneous thermal ablation.   Signed,   [name], MD, RPVI   06/02/2020 Imaging   MR ABDOMEN WWO CONTRAST   IMPRESSION: 1. There has been interval ablation at two adjacent sites of the subcapsular right lobe of the liver, hepatic segment VI. There is no residual contrast enhancement at these adjacent sites. LI-RADS category 5 TR, nonviable. 2. There are scattered, subcentimeter foci of arterial hyperenhancement of the anterior right lobe of the liver, for example adjacent to the ablation site measuring 3 mm and in the medial anterior right lobe of the liver measuring 6 mm. This larger focus is unchanged compared to prior. Others were not previously seen. These findings are nonspecific and consistent with LI-RADS category 3. Attention on follow-up.   09/02/2020 Imaging   MR ABDOMEN WWO CONTRAST     IMPRESSION: 1. Post radiofrequency ablation along the margin of the RIGHT hemi liver. Heterogeneous appearance of this area on both T1  and T2. No sign of residual enhancement. LR TR nonviable. 2. Very subtle focus of restricted diffusion in the cephalad lateral segment LEFT hepatic lobe measuring 7 mm, corresponding to low signal on image 27 of series 21. Arterial enhancement is not seen in this area and this areas not seen on prior imaging. Not  clear whether this represents a new lesion as this area is obscured largely by artifact on most sequences on today's study and on previous imaging evaluations. LR category 3, consider a 3 to six-month follow-up with abdominal MRI. 3. Lesions of concern scattered about the RIGHT hepatic lobe may represent perfusional anomalies as they were not seen on today's study. 4. Stable mild dilation of the biliary tree.   11/27/2020 Imaging   MR ABDOMEN WWO CONTRAST   IMPRESSION: Minimal decrease in size of ablation defect in the peripheral anterior right hepatic lobe. No evidence of locally recurrent hepatocellular carcinoma or other suspicious liver lesions.   No evidence of metastatic disease or other acute findings.   04/03/2021 Imaging   MR ABDOMEN WWO CONTRAST   IMPRESSION: Stable ablation defect in the lateral right hepatic lobe, without evidence of recurrent tumor at this site.   5 mm hypovascular lesion in segment 5 of the right lobe was not definitely seen on previous study, but is too small to characterize. This shows no arterial phase hyperenhancement. Recommend continued follow-up by MRI in 6 months.   No evidence of abdominal metastatic disease.        09/03/2021 Imaging   MR ABDOMEN WWO CONTRAST   IMPRESSION: 1. Side-by-side ablation sites in the right hepatic lobe in segment 6. The more anterior site remains largely cystic in appearance and no findings suspicious recurrent tumor in this area. There is however abnormal contrast enhancement around the larger more posterior and lateral ablation site worrisome for recurrent tumor. 2. Several small foci of early arterial phase enhancement in the liver. The peripheral lesions are likely vascular shunts. The more central lesions are likely dysplastic nodules. Recommend continued surveillance. 3. Stable bilateral renal cysts.   12/07/2021 Imaging   MR ABDOMEN WWO CONTRAST  IMPRESSION: 1. Ablation defects in the right  lobe of the liver, similar to the prior study, without definitive evidence to suggest local recurrence of disease or metastatic disease. 2. Multiple tiny arterial phase areas of nodular hyperenhancement scattered throughout the liver, predominantly in the right lobe of the liver, without perceptible signal abnormality or diffusion restriction on other pulse sequences, nonspecific, and likely small benign areas of vascular shunting. Continued attention on follow-up imaging is recommended. 3. Multiple Bosniak class 1 and Bosniak class 2 cysts in the kidneys bilaterally, similar to the prior study, as above. 4. Aortic atherosclerosis.     06/01/2022 Imaging   MR ABDOMEN WWO CONTRAST   IMPRESSION: 1. LR TR equivocal but suspicious findings with respect to previously treated disease in the RIGHT hepatic lobe given nodular enhancement adjacent and anterior superior to the ablation zone as described. 2. Given lack of late arterial phase due to bolus timing a second area of abnormality within the liver is suspicious for additional site of hepatocellular carcinoma showing washout appearance and capsule appearance on current imaging, cardiac pulsation limiting assessment in the area of concern. 3. Suspect bony metastatic disease in the lower lumbar spine at L5 level. Dedicated spinal imaging may be helpful for further evaluation.     06/18/2022 Imaging   MR Lumbar Spine W Wo Contrast   IMPRESSION: L5 vertebral body lesion  is most consistent with a metastasis. There is probable trace ventral epidural extension   07/17/2022 Procedure   CT GUIDE TISSUE ABLATION   IMPRESSION: Successful CT-guided microwave ablation of recurrent hepatocellular carcinoma.   07/17/2022 Procedure   CT BONE TROCAR/NEEDLE BIOPSY SUPERFICIAL   IMPRESSION: Successful CT-guided core biopsy of L5 bone lesion.      07/17/2022 Pathology Results   SURGICAL PATHOLOGY  CASE: [id number]  PATIENT: Corey Armstrong  Surgical Pathology Report   SURGICAL PATHOLOGY  CASE: [id number]  PATIENT: Malachy Mood  Surgical Pathology Report      Clinical History: Hx of cirrhosis and small HCC with unexpected  suspicious lesion at L5. Rare HCC met to bone vs atypical hemangioma or  met from new primary. (crm)      FINAL MICROSCOPIC DIAGNOSIS:   A. 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 is here for a follow up of HCC. He was last seen by me on 02/12/2023. He presents to the clinic alone. Pt state with the Lenvima. Pt's has no other issues or concerns. He is clinically doing well.    All other systems were reviewed with the patient and are negative.  MEDICAL HISTORY:  Past Medical History:  Diagnosis Date   Cancer (HCC)    Headache    migraines   Hepatitis    HX OF hEP c ?    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    SURGICAL HISTORY: Past Surgical History:  Procedure Laterality Date   IR RADIOLOGIST EVAL & MGMT  01/25/2020   IR RADIOLOGIST EVAL & MGMT  03/21/2020   IR RADIOLOGIST EVAL & MGMT  06/07/2020   IR RADIOLOGIST EVAL & MGMT  09/05/2020   IR RADIOLOGIST EVAL & MGMT  12/05/2020   IR RADIOLOGIST EVAL & MGMT  05/09/2021   IR RADIOLOGIST EVAL & MGMT  09/06/2021   IR RADIOLOGIST EVAL & MGMT  12/12/2021   IR RADIOLOGIST EVAL & MGMT  06/25/2022   IR RADIOLOGIST EVAL & MGMT  07/24/2022   RADIOLOGY WITH ANESTHESIA N/A 03/01/2020   Procedure: CT WITH ANESTHESIA  THERMAL MICROWAVE  ABLATIION;  Surgeon: [name], MD;  Location: [facility];  Service: Anesthesiology;  Laterality: N/A;   RADIOLOGY WITH ANESTHESIA N/A 07/17/2022   Procedure: CT MICROWAVE ABLATION;  Surgeon: [name], MD;  Location: [facility];  Service: Radiology;  Laterality: N/A;    I have reviewed the social history and family history with the  patient and they are unchanged from previous note.  ALLERGIES:  has No Known Allergies.  MEDICATIONS:  Current Outpatient Medications  Medication Sig Dispense Refill   LENVIMA, 12 MG DAILY DOSE, 3 x 4 MG capsule TAKE 12 MG (3 X 4 MG CAPSULES) BY MOUTH 1 TIME A DAY 90 each 1   losartan-hydrochlorothiazide (HYZAAR) 100-25 MG tablet Take 0.5 tablets by mouth daily. 1 tablet 0   ondansetron (ZOFRAN) 8 MG tablet Take 1 tablet (8 mg total) by mouth every 8 (eight) hours as needed for nausea or vomiting. 30 tablet 2   prochlorperazine (COMPAZINE) 10 MG tablet Take 1 tablet (10 mg total) by mouth every 6 (six) hours as needed. 30 tablet 2   No current facility-administered medications for this visit.    PHYSICAL EXAMINATION: ECOG PERFORMANCE STATUS: 0 - Asymptomatic  Vitals:   04/29/23 0913  BP: (!) 161/60  Pulse: 69  Resp: 16  Temp: 97.7 F (36.5 C)  SpO2: 99%   Wt Readings from Last 3 Encounters:  04/29/23 131 lb 14.4 oz (59.8 kg)  02/12/23 130 lb 12.8 oz (59.3 kg)  01/08/23 133 lb 8 oz (60.6 kg)     GENERAL:alert, no distress and comfortable SKIN: skin color normal, no rashes or significant lesions EYES: normal, Conjunctiva are pink and non-injected, sclera clear  NEURO: alert & oriented x 3 with fluent speech NECK: (-)supple, thyroid normal size, non-tender, without nodularity LYMPH: (-)  no palpable lymphadenopathy in the cervical, (-) axillary  LUNGS:(-)  clear to auscultation and percussion with normal breathing effort HEART: (-)regular rate & rhythm and no murmurs and no lower extremity edema ABDOMEN:(-)abdomen soft, (-)non-tender and(-) normal bowel sounds  LABORATORY DATA:  I have reviewed the data as listed    Latest Ref Rng & Units 04/29/2023    8:39 AM 03/14/2023   10:02 AM 02/12/2023   12:48 PM  CBC  WBC 4.0 - 10.5 K/uL 5.3  6.5  5.8   Hemoglobin 13.0 - 17.0 g/dL 21.3  08.6  57.8   Hematocrit 39.0 - 52.0 % 44.9  44.6  40.5   Platelets 150 - 400 K/uL 174  170   172         Latest Ref Rng & Units 04/29/2023    8:39 AM 03/14/2023   10:02 AM 02/12/2023   12:48 PM  CMP  Glucose 70 - 99 mg/dL 469  99  97   BUN 8 - 23 mg/dL 25  19  27    Creatinine 0.61 - 1.24 mg/dL 6.29  5.28  4.13   Sodium 135 - 145 mmol/L 140  141  138   Potassium 3.5 - 5.1 mmol/L 4.3  4.7  3.6   Chloride 98 - 111 mmol/L 109  107  107   CO2 22 - 32 mmol/L 25  31  26    Calcium 8.9 - 10.3 mg/dL 9.8  24.4  9.6   Total Protein 6.5 - 8.1 g/dL 7.3  8.0  7.6   Total Bilirubin 0.3 - 1.2 mg/dL 0.4  0.3  0.4   Alkaline Phos 38 - 126 U/L 68  78  75   AST 15 - 41 U/L 18  16  14    ALT 0 - 44 U/L 12  11  8        RADIOGRAPHIC STUDIES: I have personally reviewed the radiological images as listed and agreed with the findings in the report. No results found.    No orders of the defined types were placed in this encounter.  All questions were answered. The patient knows to call the clinic with any problems, questions or concerns. No barriers to learning was detected. The total time spent in the appointment was 25 minutes.     Malachy Mood, MD 04/29/2023   Carolin Coy, CMA, am acting as scribe for Malachy Mood, MD.   I have reviewed the above documentation for accuracy and completeness, and I agree with the above.

## 2023-04-29 NOTE — Assessment & Plan Note (Signed)
diagnosed 11/2019 by MRI for f/u of cirrhosis. S/p microwave ablation on 03/01/20. -MRI 06/01/22 showed suspicion for local recurrence and metastasis at L5. Biopsy of L5 confirmed metastatic HCC. S/p repeat microwave ablation on 07/17/22. -started lenvima 08/16/22 -staging CT CAP and bone scan on 09/19/22 showed: interval enlargement of right hepatic lobe HCC; new retropulsed bony fragment at L5; no other new lesions. His previous CT was in 05/2022 and he did not start Lenvima until 07/2022, so it's hard to evaluate if he is responding to [facility] or not, the progression is likely the nature course of his [facility]  -Restaging CT scan from January 02, 2023 showed slightly improved previously treated liver lesion, and additional area of concern in the liver which are difficult to compare with previous scan due to different timing of contrast.  No other evidence of disease progression.  Plan to obtain MRI as next scan.  I personally reviewed the CT scan images and discussed the findings with patient -Will continue Lenvima, he is tolerating well overall. -Due to his weight being above 60 kg now, I increased his Lenvima to 12 mg daily in Feb 2024 -repeated liver MRI on 04/23/2023 showed

## 2023-05-29 ENCOUNTER — Other Ambulatory Visit: Payer: Self-pay

## 2023-05-29 NOTE — Progress Notes (Signed)
Patients wife called in stating that the patient is having sever pain in his legs. Patient has tried tylenol and advil that isn't working would like to be seen by [facility]. I spoke with Fleet Contras and Yvonna Alanis he can be seen on Friday at 930. I let the wife know and they will be here at 930 tomorrow.

## 2023-05-29 NOTE — Progress Notes (Signed)
Symptom Management Consult Note [facility]    Patient Care Team: [name], MD as PCP - General (Internal Medicine) Malachy Mood, MD as Attending Physician (Hematology and Oncology)    Name / MRN / DOB: Corey Armstrong  [id number]  [birth year]   Date of visit: 05/30/2023   Chief Complaint/Reason for visit: pain   Current Therapy: Lenvima of 12 mg daily    ASSESSMENT & PLAN: Patient is a 67 y.o. male  with oncologic history of hepatocellular carcinoma followed by Dr. Mosetta Putt.  I have viewed most recent oncology note and lab work.    #Hepatocellular carcinoma - Next appointment with oncologist is 07/31/23   #Back pain -Neuro exam without red flags, nothing to suggest cauda equina. -Chart review shows patient has MR abdomen on 04/23/23 that showed known L5 T2 hypointense lesion that had increased in size to 2.3 cm from 1.7 cm from 06/01/22. With his new worsening pain around L5 plan to get MR of lumbar spine. -Patient does not take acetaminophen because of his disease so will give a dose of IM Toradol in clinic and prescribe short course of oxycodone for pain relief while waiting for scan. I have reviewed the PDMP during this encounter. Discussed how to safely take narcotics.  -Dr. Mosetta Putt is agreeable with plan of care.   Strict ED precautions discussed should symptoms worsen.     Heme/Onc History: Oncology History  Hepatocellular carcinoma (HCC)  12/08/2019 Imaging   MR ABDOMEN WWO CONTRAST   IMPRESSION: Two small masses in the right hepatic lobe which have characteristics diagnostic for hepatocellular carcinoma in the setting of cirrhosis. (LI-RADS Category 5: Definitely HCC)   No evidence of abdominal metastatic disease or other significant abnormality.   03/01/2020 Initial Diagnosis   Hepatocellular carcinoma (HCC)   03/01/2020 Procedure   CT GUIDE TISSUE ABLATION     IMPRESSION: Successful percutaneous thermal ablation.   Signed,    [name], MD, RPVI   06/02/2020 Imaging   MR ABDOMEN WWO CONTRAST   IMPRESSION: 1. There has been interval ablation at two adjacent sites of the subcapsular right lobe of the liver, hepatic segment VI. There is no residual contrast enhancement at these adjacent sites. LI-RADS category 5 TR, nonviable. 2. There are scattered, subcentimeter foci of arterial hyperenhancement of the anterior right lobe of the liver, for example adjacent to the ablation site measuring 3 mm and in the medial anterior right lobe of the liver measuring 6 mm. This larger focus is unchanged compared to prior. Others were not previously seen. These findings are nonspecific and consistent with LI-RADS category 3. Attention on follow-up.   09/02/2020 Imaging   MR ABDOMEN WWO CONTRAST     IMPRESSION: 1. Post radiofrequency ablation along the margin of the RIGHT hemi liver. Heterogeneous appearance of this area on both T1 and T2. No sign of residual enhancement. LR TR nonviable. 2. Very subtle focus of restricted diffusion in the cephalad lateral segment LEFT hepatic lobe measuring 7 mm, corresponding to low signal on image 27 of series 21. Arterial enhancement is not seen in this area and this areas not seen on prior imaging. Not clear whether this represents a new lesion as this area is obscured largely by artifact on most sequences on today's study and on previous imaging evaluations. LR category 3, consider a 3 to six-month follow-up with abdominal MRI. 3. Lesions of concern scattered about the RIGHT hepatic lobe may represent perfusional anomalies as they were  not seen on today's study. 4. Stable mild dilation of the biliary tree.   11/27/2020 Imaging   MR ABDOMEN WWO CONTRAST   IMPRESSION: Minimal decrease in size of ablation defect in the peripheral anterior right hepatic lobe. No evidence of locally recurrent hepatocellular carcinoma or other suspicious liver lesions.   No evidence  of metastatic disease or other acute findings.   04/03/2021 Imaging   MR ABDOMEN WWO CONTRAST   IMPRESSION: Stable ablation defect in the lateral right hepatic lobe, without evidence of recurrent tumor at this site.   5 mm hypovascular lesion in segment 5 of the right lobe was not definitely seen on previous study, but is too small to characterize. This shows no arterial phase hyperenhancement. Recommend continued follow-up by MRI in 6 months.   No evidence of abdominal metastatic disease.        09/03/2021 Imaging   MR ABDOMEN WWO CONTRAST   IMPRESSION: 1. Side-by-side ablation sites in the right hepatic lobe in segment 6. The more anterior site remains largely cystic in appearance and no findings suspicious recurrent tumor in this area. There is however abnormal contrast enhancement around the larger more posterior and lateral ablation site worrisome for recurrent tumor. 2. Several small foci of early arterial phase enhancement in the liver. The peripheral lesions are likely vascular shunts. The more central lesions are likely dysplastic nodules. Recommend continued surveillance. 3. Stable bilateral renal cysts.   12/07/2021 Imaging   MR ABDOMEN WWO CONTRAST  IMPRESSION: 1. Ablation defects in the right lobe of the liver, similar to the prior study, without definitive evidence to suggest local recurrence of disease or metastatic disease. 2. Multiple tiny arterial phase areas of nodular hyperenhancement scattered throughout the liver, predominantly in the right lobe of the liver, without perceptible signal abnormality or diffusion restriction on other pulse sequences, nonspecific, and likely small benign areas of vascular shunting. Continued attention on follow-up imaging is recommended. 3. Multiple Bosniak class 1 and Bosniak class 2 cysts in the kidneys bilaterally, similar to the prior study, as above. 4. Aortic atherosclerosis.     06/01/2022 Imaging   MR  ABDOMEN WWO CONTRAST   IMPRESSION: 1. LR TR equivocal but suspicious findings with respect to previously treated disease in the RIGHT hepatic lobe given nodular enhancement adjacent and anterior superior to the ablation zone as described. 2. Given lack of late arterial phase due to bolus timing a second area of abnormality within the liver is suspicious for additional site of hepatocellular carcinoma showing washout appearance and capsule appearance on current imaging, cardiac pulsation limiting assessment in the area of concern. 3. Suspect bony metastatic disease in the lower lumbar spine at L5 level. Dedicated spinal imaging may be helpful for further evaluation.     06/18/2022 Imaging   MR Lumbar Spine W Wo Contrast   IMPRESSION: L5 vertebral body lesion is most consistent with a metastasis. There is probable trace ventral epidural extension   07/17/2022 Procedure   CT GUIDE TISSUE ABLATION   IMPRESSION: Successful CT-guided microwave ablation of recurrent hepatocellular carcinoma.   07/17/2022 Procedure   CT BONE TROCAR/NEEDLE BIOPSY SUPERFICIAL   IMPRESSION: Successful CT-guided core biopsy of L5 bone lesion.      07/17/2022 Pathology Results   SURGICAL PATHOLOGY  CASE: [id number]  PATIENT: GENO SYDNOR  Surgical Pathology Report   SURGICAL PATHOLOGY  CASE: [id number]  PATIENT: Malachy Mood  Surgical Pathology Report      Clinical History: Hx of cirrhosis and small HCC with unexpected  suspicious lesion at L5. Rare HCC met to bone vs atypical hemangioma or  met from new primary. (crm)      FINAL MICROSCOPIC DIAGNOSIS:   A. 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 is a 67 y.o. male with oncologic history as above presenting to [facility] today with chief complaint of back pain. He  presents unaccompanied to clinic today.  Patient reports his pain started on 07/02 and has been progressively worsening. He is reporting pain in his low back on the right side. Pain is throbbing and feels like it is in his bones. Pain recently started to radiate to his femur and now to his toes. Sometimes the leg will feel numb. Pain is constant. He denies history of similar pain. He denies any injury or trauma preceding pain. He has been taking ibuprofen which helps take the edge off however does not seem to be doing much. Pain is keeping him awake at night. He denies any incontinence, urinary retention, constipation.      ROS  All other systems are reviewed and are negative for acute change except as noted in the HPI.    No Known Allergies   Past Medical History:  Diagnosis Date   Cancer (HCC)    Headache    migraines   Hepatitis    HX OF hEP c ?    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     Past Surgical History:  Procedure Laterality Date   IR RADIOLOGIST EVAL & MGMT  01/25/2020   IR RADIOLOGIST EVAL & MGMT  03/21/2020   IR RADIOLOGIST EVAL & MGMT  06/07/2020   IR RADIOLOGIST EVAL & MGMT  09/05/2020   IR RADIOLOGIST EVAL & MGMT  12/05/2020   IR RADIOLOGIST EVAL & MGMT  05/09/2021   IR RADIOLOGIST EVAL & MGMT  09/06/2021   IR RADIOLOGIST EVAL & MGMT  12/12/2021   IR RADIOLOGIST EVAL & MGMT  06/25/2022   IR RADIOLOGIST EVAL & MGMT  07/24/2022   RADIOLOGY WITH ANESTHESIA N/A 03/01/2020   Procedure: CT WITH ANESTHESIA  THERMAL MICROWAVE  ABLATIION;  Surgeon: [name], MD;  Location: [facility];  Service: Anesthesiology;  Laterality: N/A;   RADIOLOGY WITH ANESTHESIA N/A 07/17/2022   Procedure: CT MICROWAVE ABLATION;  Surgeon: [name], MD;  Location: [facility];  Service: Radiology;  Laterality: N/A;    Social History   Socioeconomic History   Marital status: Divorced    Spouse name: Not on file   Number of children: 2   Years of education: 12   Highest education  level: Not on file  Occupational History   Occupation: Builds snowblowers    Employer: [employer]  Tobacco Use   Smoking status: Former    Passive exposure: Current   Smokeless tobacco: Never   Tobacco comments:    3 per week  Vaping Use   Vaping status: Never Used  Substance and Sexual Activity   Alcohol use: Not Currently   Drug use: No   Sexual activity: Not Currently  Other Topics Concern   Not on file  Social History Narrative   Divorced but has relationship with ex   2 children   Daughter lives with him   Son in [place]      No living will   Ex wife Myraette should make decisions  on his behalf   Would accept resuscitation   Not sure about tube feeds   Social Determinants of Health   Financial Resource Strain: Not on file  Food Insecurity: Not on file  Transportation Needs: Not on file  Physical Activity: Not on file  Stress: Not on file  Social Connections: Not on file  Intimate Partner Violence: Not on file    Family History  Problem Relation Age of Onset   Arthritis Mother    Cancer Neg Hx    Diabetes Neg Hx    Drug abuse Neg Hx    Heart disease Neg Hx    Hyperlipidemia Neg Hx    Hypertension Neg Hx    Kidney disease Neg Hx    Stroke Neg Hx      Current Outpatient Medications:    oxyCODONE (OXY IR/ROXICODONE) 5 MG immediate release tablet, Take 1 tablet (5 mg total) by mouth every 6 (six) hours as needed for up to 5 days for severe pain., Disp: 20 tablet, Rfl: 0   LENVIMA, 12 MG DAILY DOSE, 3 x 4 MG capsule, TAKE 12 MG (3 X 4 MG CAPSULES) BY MOUTH 1 TIME A DAY, Disp: 90 each, Rfl: 1   losartan-hydrochlorothiazide (HYZAAR) 100-25 MG tablet, Take 0.5 tablets by mouth daily., Disp: 1 tablet, Rfl: 0   ondansetron (ZOFRAN) 8 MG tablet, Take 1 tablet (8 mg total) by mouth every 8 (eight) hours as needed for nausea or vomiting., Disp: 30 tablet, Rfl: 2   prochlorperazine (COMPAZINE) 10 MG tablet, Take 1 tablet (10 mg total) by mouth every 6 (six) hours as  needed., Disp: 30 tablet, Rfl: 2  PHYSICAL EXAM: ECOG FS:1 - Symptomatic but completely ambulatory    Vitals:   05/30/23 0917  BP: (!) 166/75  Pulse: 88  Resp: 16  Temp: 98.2 F (36.8 C)  TempSrc: Oral  SpO2: 99%  Weight: 131 lb 4.8 oz (59.6 kg)   Physical Exam Vitals and nursing note reviewed.  Constitutional:      Appearance: He is not ill-appearing or toxic-appearing.  HENT:     Head: Normocephalic.  Eyes:     Conjunctiva/sclera: Conjunctivae normal.  Cardiovascular:     Rate and Rhythm: Normal rate and regular rhythm.  Pulmonary:     Effort: Pulmonary effort is normal.  Abdominal:     General: There is no distension.  Musculoskeletal:        General: Normal range of motion.     Cervical back: Normal range of motion.     Right lower leg: No edema.     Left lower leg: No edema.     Comments: Tenderness to palpation of L5 without crepitus, deformity or step offs. Tenderness to palpation of the paraspinous muscles of lumbar spine  Pelvis is stable. ROM of BLE normal.  Skin:    General: Skin is warm and dry.  Neurological:     Mental Status: He is alert.     Comments: Sensation grossly intact to light touch in the lower extremities bilaterally. No saddle anesthesias. Strength 5/5 with flexion and extension at the bilateral hips, knees, and ankles. Antalgic gait Coordination intact with heel to shin testing.           LABORATORY DATA: I have reviewed the data as listed    Latest Ref Rng & Units 04/29/2023    8:39 AM 03/14/2023   10:02 AM 02/12/2023   12:48 PM  CBC  WBC 4.0 - 10.5 K/uL 5.3  6.5  5.8   Hemoglobin 13.0 - 17.0 g/dL 21.3  08.6  57.8   Hematocrit 39.0 - 52.0 % 44.9  44.6  40.5   Platelets 150 - 400 K/uL 174  170  172         Latest Ref Rng & Units 04/29/2023    8:39 AM 03/14/2023   10:02 AM 02/12/2023   12:48 PM  CMP  Glucose 70 - 99 mg/dL 469  99  97   BUN 8 - 23 mg/dL 25  19  27    Creatinine 0.61 - 1.24 mg/dL 6.29  5.28  4.13   Sodium  135 - 145 mmol/L 140  141  138   Potassium 3.5 - 5.1 mmol/L 4.3  4.7  3.6   Chloride 98 - 111 mmol/L 109  107  107   CO2 22 - 32 mmol/L 25  31  26    Calcium 8.9 - 10.3 mg/dL 9.8  24.4  9.6   Total Protein 6.5 - 8.1 g/dL 7.3  8.0  7.6   Total Bilirubin 0.3 - 1.2 mg/dL 0.4  0.3  0.4   Alkaline Phos 38 - 126 U/L 68  78  75   AST 15 - 41 U/L 18  16  14    ALT 0 - 44 U/L 12  11  8         RADIOGRAPHIC STUDIES (from last 24 hours if applicable) I have personally reviewed the radiological images as listed and agreed with the findings in the report. No results found.      Visit Diagnosis: 1. Acute bilateral back pain, unspecified back location   2. Hepatocellular carcinoma (HCC)      Orders Placed This Encounter  Procedures   MR Lumbar Spine W Wo Contrast    Standing Status:   Future    Standing Expiration Date:   05/29/2024    Order Specific Question:   If indicated for the ordered procedure, I authorize the administration of contrast media per Radiology protocol    Answer:   Yes    Order Specific Question:   What is the patient's sedation requirement?    Answer:   No Sedation    Order Specific Question:   Does the patient have a pacemaker or implanted devices?    Answer:   No    Order Specific Question:   Use SRS Protocol?    Answer:   Yes    Order Specific Question:   Call Results- Best Contact Number?    Answer:   Dr. Mosetta Putt [phone]    Order Specific Question:   Preferred imaging location?    Answer:   [facility] (table limit - 550 lbs)    All questions were answered. The patient knows to call the clinic with any problems, questions or concerns. No barriers to learning was detected.  A total of more than 30 minutes were spent on this encounter with face-to-face time and non-face-to-face time, including preparing to see the patient, ordering tests and/or medications, counseling the patient and coordination of care as outlined above.    Thank you for allowing me  to participate in the care of this patient.    Shanon Ace, PA-C Department of Hematology/Oncology [facility] at [facility] Phone: [phone]  [phone]    05/30/2023 2:42 PM

## 2023-05-30 ENCOUNTER — Inpatient Hospital Stay: Payer: Commercial Managed Care - PPO

## 2023-05-30 ENCOUNTER — Telehealth: Payer: Self-pay

## 2023-05-30 ENCOUNTER — Inpatient Hospital Stay: Payer: Commercial Managed Care - PPO | Attending: Hematology | Admitting: Physician Assistant

## 2023-05-30 ENCOUNTER — Other Ambulatory Visit: Payer: Self-pay

## 2023-05-30 VITALS — BP 166/75 | HR 88 | Temp 98.2°F | Resp 16 | Wt 131.3 lb

## 2023-05-30 DIAGNOSIS — Z79899 Other long term (current) drug therapy: Secondary | ICD-10-CM | POA: Diagnosis not present

## 2023-05-30 DIAGNOSIS — I700 Atherosclerosis of aorta: Secondary | ICD-10-CM | POA: Diagnosis not present

## 2023-05-30 DIAGNOSIS — Z791 Long term (current) use of non-steroidal anti-inflammatories (NSAID): Secondary | ICD-10-CM | POA: Insufficient documentation

## 2023-05-30 DIAGNOSIS — R011 Cardiac murmur, unspecified: Secondary | ICD-10-CM | POA: Diagnosis not present

## 2023-05-30 DIAGNOSIS — K7460 Unspecified cirrhosis of liver: Secondary | ICD-10-CM | POA: Insufficient documentation

## 2023-05-30 DIAGNOSIS — N281 Cyst of kidney, acquired: Secondary | ICD-10-CM | POA: Diagnosis not present

## 2023-05-30 DIAGNOSIS — M549 Dorsalgia, unspecified: Secondary | ICD-10-CM

## 2023-05-30 DIAGNOSIS — C7951 Secondary malignant neoplasm of bone: Secondary | ICD-10-CM | POA: Diagnosis not present

## 2023-05-30 DIAGNOSIS — C220 Liver cell carcinoma: Secondary | ICD-10-CM | POA: Insufficient documentation

## 2023-05-30 DIAGNOSIS — M545 Low back pain, unspecified: Secondary | ICD-10-CM | POA: Diagnosis not present

## 2023-05-30 DIAGNOSIS — I10 Essential (primary) hypertension: Secondary | ICD-10-CM | POA: Insufficient documentation

## 2023-05-30 MED ORDER — KETOROLAC TROMETHAMINE 30 MG/ML IJ SOLN
30.0000 mg | Freq: Once | INTRAMUSCULAR | Status: AC
  Administered 2023-05-30: 30 mg via INTRAMUSCULAR
  Filled 2023-05-30: qty 1

## 2023-05-30 MED ORDER — OXYCODONE HCL 5 MG PO TABS: 5.0000 mg | ORAL_TABLET | Freq: Four times a day (QID) | ORAL | 0 refills | Status: DC | PRN

## 2023-05-30 NOTE — Telephone Encounter (Signed)
This RN called patient to inform him of date, time and location of upcoming MRI. Patient verbalized understanding that MRI will be on Saturday, 7/27 at 0900 and [facility] ED.

## 2023-05-30 NOTE — Patient Instructions (Signed)
Pain medication oxycodone sent in to your pharmacy. This is for severe pain only. Take it with food so it does not upset your stomach. Oxycodone can make you drowsy so do not drive or work while taking it. For pain medication during work continue to take ibuprofen.    We have ordered an MRI for your back. I am waiting for your insurance to approve it. Once approved we can schedule the MRI for you. We will call and let you know when it is. It will also show up as an appointment in your MyChart.  If your pain severely worsen, if you start having difficulty walking, are incontinent of urine or stool or experiencing constipation these are all emergencies and you need to go to the emergency department immediately.

## 2023-06-07 ENCOUNTER — Ambulatory Visit (HOSPITAL_COMMUNITY)
Admission: RE | Admit: 2023-06-07 | Discharge: 2023-06-07 | Disposition: A | Discharge status: Home / Self Care | Payer: Commercial Managed Care - PPO | Source: Ambulatory Visit | Attending: Physician Assistant | Admitting: Physician Assistant

## 2023-06-07 DIAGNOSIS — M549 Dorsalgia, unspecified: Secondary | ICD-10-CM | POA: Insufficient documentation

## 2023-06-07 DIAGNOSIS — C220 Liver cell carcinoma: Secondary | ICD-10-CM | POA: Insufficient documentation

## 2023-06-07 MED ORDER — GADOBUTROL 1 MMOL/ML IV SOLN
5.0000 mL | Freq: Once | INTRAVENOUS | Status: AC | PRN
  Administered 2023-06-07: 5 mL via INTRAVENOUS

## 2023-06-09 ENCOUNTER — Telehealth: Payer: Self-pay | Admitting: Hematology

## 2023-06-09 ENCOUNTER — Other Ambulatory Visit: Payer: Self-pay

## 2023-06-10 ENCOUNTER — Encounter: Payer: Self-pay | Admitting: Hematology

## 2023-06-10 ENCOUNTER — Other Ambulatory Visit: Payer: Self-pay

## 2023-06-10 ENCOUNTER — Other Ambulatory Visit: Payer: Commercial Managed Care - PPO

## 2023-06-10 ENCOUNTER — Inpatient Hospital Stay: Payer: Commercial Managed Care - PPO | Admitting: Hematology

## 2023-06-10 VITALS — BP 176/79 | HR 98 | Temp 98.2°F | Resp 18 | Ht 67.5 in | Wt 132.9 lb

## 2023-06-10 DIAGNOSIS — C220 Liver cell carcinoma: Secondary | ICD-10-CM | POA: Diagnosis not present

## 2023-06-10 LAB — CBC WITH DIFFERENTIAL (CANCER CENTER ONLY)
Abs Immature Granulocytes: 0.01 10*3/uL (ref 0.00–0.07)
Basophils Absolute: 0 10*3/uL (ref 0.0–0.1)
Basophils Relative: 1 %
Eosinophils Absolute: 0.2 10*3/uL (ref 0.0–0.5)
Eosinophils Relative: 2 %
HCT: 41.6 % (ref 39.0–52.0)
Hemoglobin: 13.9 g/dL (ref 13.0–17.0)
Immature Granulocytes: 0 %
Lymphocytes Relative: 24 %
Lymphs Abs: 1.7 10*3/uL (ref 0.7–4.0)
MCH: 32 pg (ref 26.0–34.0)
MCHC: 33.4 g/dL (ref 30.0–36.0)
MCV: 95.6 fL (ref 80.0–100.0)
Monocytes Absolute: 0.4 10*3/uL (ref 0.1–1.0)
Monocytes Relative: 6 %
Neutro Abs: 4.6 10*3/uL (ref 1.7–7.7)
Neutrophils Relative %: 67 %
Platelet Count: 195 10*3/uL (ref 150–400)
RBC: 4.35 MIL/uL (ref 4.22–5.81)
RDW: 13.2 % (ref 11.5–15.5)
WBC Count: 6.9 10*3/uL (ref 4.0–10.5)
nRBC: 0 % (ref 0.0–0.2)

## 2023-06-10 LAB — CMP (CANCER CENTER ONLY)
ALT: 10 U/L (ref 0–44)
AST: 15 U/L (ref 15–41)
Albumin: 4.3 g/dL (ref 3.5–5.0)
Alkaline Phosphatase: 65 U/L (ref 38–126)
Anion gap: 7 (ref 5–15)
BUN: 24 mg/dL — ABNORMAL HIGH (ref 8–23)
CO2: 26 mmol/L (ref 22–32)
Calcium: 10.1 mg/dL (ref 8.9–10.3)
Chloride: 106 mmol/L (ref 98–111)
Creatinine: 1.24 mg/dL (ref 0.61–1.24)
GFR, Estimated: >60 mL/min (ref >60)
Glucose, Bld: 133 mg/dL — ABNORMAL HIGH (ref 70–99)
Potassium: 3.7 mmol/L (ref 3.5–5.1)
Sodium: 139 mmol/L (ref 135–145)
Total Bilirubin: 0.4 mg/dL (ref 0.3–1.2)
Total Protein: 7.6 g/dL (ref 6.5–8.1)

## 2023-06-10 MED ORDER — GABAPENTIN 300 MG PO CAPS: 300.0000 mg | ORAL_CAPSULE | Freq: Every day | ORAL | 0 refills | Status: DC

## 2023-06-10 MED ORDER — MELOXICAM 7.5 MG PO TABS: 7.5000 mg | ORAL_TABLET | Freq: Every day | ORAL | 0 refills | Status: DC

## 2023-06-10 NOTE — Assessment & Plan Note (Signed)
diagnosed 11/2019 by MRI for f/u of cirrhosis. S/p microwave ablation on 03/01/20. -MRI 06/01/22 showed suspicion for local recurrence and metastasis at L5. Biopsy of L5 confirmed metastatic HCC. S/p repeat microwave ablation on 07/17/22. -started lenvima 08/16/22 -staging CT CAP and bone scan on 09/19/22 showed: interval enlargement of right hepatic lobe HCC; new retropulsed bony fragment at L5; no other new lesions. His previous CT was in 05/2022 and he did not start Lenvima until 07/2022, so it's hard to evaluate if he is responding to [facility] or not, the progression is likely the nature course of his [facility]  -Restaging CT scan from January 02, 2023 showed slightly improved previously treated liver lesion, and additional area of concern in the liver which are difficult to compare with previous scan due to different timing of contrast.  No other evidence of disease progression.  Plan to obtain MRI as next scan.  I personally reviewed the CT scan images and discussed the findings with patient -Will continue Lenvima, he is tolerating well overall. -Due to his weight being above 60 kg now, I increased his Lenvima to 12 mg daily in Feb 2024 -repeated liver MRI on 04/23/2023 showed stable treated liver lesion, L5 bone mets has slightly increased in size.  Patient developed worsening low back pain in July 2024, repeated lumbar MRI showed significant disease progression in the L5 bone lesion.  I personally reviewed the scan images and discussed the findings with patient. -I recommend palliative radiation to his L5 lesion for pain control -I recommend changing systemic treatment to immunotherapy, I discussed option of atezolizumab and bevacizumab, durvalumab and Imjudo, navel and ipi etc. since he previously progressed on oral TKI, I recommend him to proceed with durvalumab and Imjudo --Chemotherapy consent: Side effects including but not limited to, fatigue, pneumonitis, colitis, hepatitis, nephritis, carditis,  pancreatitis, thyroid and other endocrine dysfunction, rash, arthralgia, muscular and neuro toxicities,  etc. were discussed with patient in great detail.  We discussed that most side effect of mild and manageable, but severe and life-threatening side effect could happen also and may require treatment and hospital admission. We discussed side effect happens in 40-60%.  Pt voiced good understanding and agrees to proceed. -the goal of therapy is curative/palliative to prolong life and prevent/improve cancer related symptoms.

## 2023-06-10 NOTE — Progress Notes (Signed)
[facility]   [phone] [phone]   Clinic Follow up Note   Patient Care Team: [name], MD as PCP - General (Internal Medicine) Malachy Mood, MD as Attending Physician (Hematology and Oncology)  Date of Service:  06/10/2023  CHIEF COMPLAINT: f/u of HCC   CURRENT THERAPY:  Lenvima of 12 mg daily   ASSESSMENT:  Corey Berardi Sr. is a 67 y.o. male with   Hepatocellular carcinoma (HCC) diagnosed 11/2019 by MRI for f/u of cirrhosis. S/p microwave ablation on 03/01/20. -MRI 06/01/22 showed suspicion for local recurrence and metastasis at L5. Biopsy of L5 confirmed metastatic HCC. S/p repeat microwave ablation on 07/17/22. -started lenvima 08/16/22 -staging CT CAP and bone scan on 09/19/22 showed: interval enlargement of right hepatic lobe HCC; new retropulsed bony fragment at L5; no other new lesions. His previous CT was in 05/2022 and he did not start Lenvima until 07/2022, so it's hard to evaluate if he is responding to [facility] or not, the progression is likely the nature course of his [facility]  -Restaging CT scan from January 02, 2023 showed slightly improved previously treated liver lesion, and additional area of concern in the liver which are difficult to compare with previous scan due to different timing of contrast.  No other evidence of disease progression.  Plan to obtain MRI as next scan.  I personally reviewed the CT scan images and discussed the findings with patient -Will continue Lenvima, he is tolerating well overall. -Due to his weight being above 60 kg now, I increased his Lenvima to 12 mg daily in Feb 2024 -repeated liver MRI on 04/23/2023 showed stable treated liver lesion, L5 bone mets has slightly increased in size.  Patient developed worsening low back pain in July 2024, repeated lumbar MRI showed significant disease progression in the L5 bone lesion.  I personally reviewed the scan images and discussed the findings with patient. -I  recommend palliative radiation to his L5 lesion for pain control -I recommend changing systemic treatment to immunotherapy, I discussed option of atezolizumab and bevacizumab, durvalumab and Imjudo, navel and ipi etc. since he previously progressed on oral TKI, I recommend him to proceed with durvalumab and Imjudo --Chemotherapy consent: Side effects including but not limited to, fatigue, pneumonitis, colitis, hepatitis, nephritis, carditis, pancreatitis, thyroid and other endocrine dysfunction, rash, arthralgia, muscular and neuro toxicities,  etc. were discussed with patient in great detail.  We discussed that most side effect of mild and manageable, but severe and life-threatening side effect could happen also and may require treatment and hospital admission. We discussed side effect happens in 40-60%.  Pt voiced good understanding and agrees to proceed. -the goal of therapy is curative/palliative to prolong life and prevent/improve cancer related symptoms.     PLAN: -I order CT chest in 1 week -I order NM scan whole body  -I prescribe Gabapentin - I prescribe Mobic - I recommend radiation and will reach out to Dr. Mitzi Hansen or Dr. Kathrynn Running  - recommend durvalumab and Imjudo, start in 2-3 weeks, after RT   SUMMARY OF ONCOLOGIC HISTORY: Oncology History  Hepatocellular carcinoma (HCC)  12/08/2019 Imaging   MR ABDOMEN WWO CONTRAST   IMPRESSION: Two small masses in the right hepatic lobe which have characteristics diagnostic for hepatocellular carcinoma in the setting of cirrhosis. (LI-RADS Category 5: Definitely HCC)   No evidence of abdominal metastatic disease or other significant abnormality.   03/01/2020 Initial Diagnosis   Hepatocellular carcinoma (HCC)   03/01/2020 Procedure  CT GUIDE TISSUE ABLATION     IMPRESSION: Successful percutaneous thermal ablation.   Signed,   [name], MD, RPVI   06/02/2020 Imaging   MR ABDOMEN WWO CONTRAST   IMPRESSION: 1. There  has been interval ablation at two adjacent sites of the subcapsular right lobe of the liver, hepatic segment VI. There is no residual contrast enhancement at these adjacent sites. LI-RADS category 5 TR, nonviable. 2. There are scattered, subcentimeter foci of arterial hyperenhancement of the anterior right lobe of the liver, for example adjacent to the ablation site measuring 3 mm and in the medial anterior right lobe of the liver measuring 6 mm. This larger focus is unchanged compared to prior. Others were not previously seen. These findings are nonspecific and consistent with LI-RADS category 3. Attention on follow-up.   09/02/2020 Imaging   MR ABDOMEN WWO CONTRAST     IMPRESSION: 1. Post radiofrequency ablation along the margin of the RIGHT hemi liver. Heterogeneous appearance of this area on both T1 and T2. No sign of residual enhancement. LR TR nonviable. 2. Very subtle focus of restricted diffusion in the cephalad lateral segment LEFT hepatic lobe measuring 7 mm, corresponding to low signal on image 27 of series 21. Arterial enhancement is not seen in this area and this areas not seen on prior imaging. Not clear whether this represents a new lesion as this area is obscured largely by artifact on most sequences on today's study and on previous imaging evaluations. LR category 3, consider a 3 to six-month follow-up with abdominal MRI. 3. Lesions of concern scattered about the RIGHT hepatic lobe may represent perfusional anomalies as they were not seen on today's study. 4. Stable mild dilation of the biliary tree.   11/27/2020 Imaging   MR ABDOMEN WWO CONTRAST   IMPRESSION: Minimal decrease in size of ablation defect in the peripheral anterior right hepatic lobe. No evidence of locally recurrent hepatocellular carcinoma or other suspicious liver lesions.   No evidence of metastatic disease or other acute findings.   04/03/2021 Imaging   MR ABDOMEN WWO CONTRAST    IMPRESSION: Stable ablation defect in the lateral right hepatic lobe, without evidence of recurrent tumor at this site.   5 mm hypovascular lesion in segment 5 of the right lobe was not definitely seen on previous study, but is too small to characterize. This shows no arterial phase hyperenhancement. Recommend continued follow-up by MRI in 6 months.   No evidence of abdominal metastatic disease.        09/03/2021 Imaging   MR ABDOMEN WWO CONTRAST   IMPRESSION: 1. Side-by-side ablation sites in the right hepatic lobe in segment 6. The more anterior site remains largely cystic in appearance and no findings suspicious recurrent tumor in this area. There is however abnormal contrast enhancement around the larger more posterior and lateral ablation site worrisome for recurrent tumor. 2. Several small foci of early arterial phase enhancement in the liver. The peripheral lesions are likely vascular shunts. The more central lesions are likely dysplastic nodules. Recommend continued surveillance. 3. Stable bilateral renal cysts.   12/07/2021 Imaging   MR ABDOMEN WWO CONTRAST  IMPRESSION: 1. Ablation defects in the right lobe of the liver, similar to the prior study, without definitive evidence to suggest local recurrence of disease or metastatic disease. 2. Multiple tiny arterial phase areas of nodular hyperenhancement scattered throughout the liver, predominantly in the right lobe of the liver, without perceptible signal abnormality or diffusion restriction on other pulse sequences, nonspecific,  and likely small benign areas of vascular shunting. Continued attention on follow-up imaging is recommended. 3. Multiple Bosniak class 1 and Bosniak class 2 cysts in the kidneys bilaterally, similar to the prior study, as above. 4. Aortic atherosclerosis.     06/01/2022 Imaging   MR ABDOMEN WWO CONTRAST   IMPRESSION: 1. LR TR equivocal but suspicious findings with respect  to previously treated disease in the RIGHT hepatic lobe given nodular enhancement adjacent and anterior superior to the ablation zone as described. 2. Given lack of late arterial phase due to bolus timing a second area of abnormality within the liver is suspicious for additional site of hepatocellular carcinoma showing washout appearance and capsule appearance on current imaging, cardiac pulsation limiting assessment in the area of concern. 3. Suspect bony metastatic disease in the lower lumbar spine at L5 level. Dedicated spinal imaging may be helpful for further evaluation.     06/18/2022 Imaging   MR Lumbar Spine W Wo Contrast   IMPRESSION: L5 vertebral body lesion is most consistent with a metastasis. There is probable trace ventral epidural extension   07/17/2022 Procedure   CT GUIDE TISSUE ABLATION   IMPRESSION: Successful CT-guided microwave ablation of recurrent hepatocellular carcinoma.   07/17/2022 Procedure   CT BONE TROCAR/NEEDLE BIOPSY SUPERFICIAL   IMPRESSION: Successful CT-guided core biopsy of L5 bone lesion.      07/17/2022 Pathology Results   SURGICAL PATHOLOGY  CASE: [id number]  PATIENT: MARCELL HOCEVAR  Surgical Pathology Report   SURGICAL PATHOLOGY  CASE: [id number]  PATIENT: Malachy Mood  Surgical Pathology Report      Clinical History: Hx of cirrhosis and small HCC with unexpected  suspicious lesion at L5. Rare HCC met to bone vs atypical hemangioma or  met from new primary. (crm)      FINAL MICROSCOPIC DIAGNOSIS:   A. 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 is here for a follow up of HCC. He was last seen by me on 04/29/2023. He presents to the clinic accompanied by wife. Pt report of having severe back pain , that wakes him up at night He state that he take  oxycodone for the pain . Pt denies having frequent urination at this time.       All other systems were reviewed with the patient and are negative.  MEDICAL HISTORY:  Past Medical History:  Diagnosis Date   Cancer (HCC)    Headache    migraines   Hepatitis    HX OF hEP c ?    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    SURGICAL HISTORY: Past Surgical History:  Procedure Laterality Date   IR RADIOLOGIST EVAL & MGMT  01/25/2020   IR RADIOLOGIST EVAL & MGMT  03/21/2020   IR RADIOLOGIST EVAL & MGMT  06/07/2020   IR RADIOLOGIST EVAL & MGMT  09/05/2020   IR RADIOLOGIST EVAL & MGMT  12/05/2020   IR RADIOLOGIST EVAL & MGMT  05/09/2021   IR RADIOLOGIST EVAL & MGMT  09/06/2021   IR RADIOLOGIST EVAL & MGMT  12/12/2021   IR RADIOLOGIST EVAL & MGMT  06/25/2022   IR RADIOLOGIST EVAL & MGMT  07/24/2022   RADIOLOGY WITH ANESTHESIA N/A 03/01/2020   Procedure: CT WITH ANESTHESIA  THERMAL MICROWAVE  ABLATIION;  Surgeon: [name], MD;  Location: [facility];  Service: Anesthesiology;  Laterality:  N/A;   RADIOLOGY WITH ANESTHESIA N/A 07/17/2022   Procedure: CT MICROWAVE ABLATION;  Surgeon: [name], MD;  Location: [facility];  Service: Radiology;  Laterality: N/A;    I have reviewed the social history and family history with the patient and they are unchanged from previous note.  ALLERGIES:  has No Known Allergies.  MEDICATIONS:  Current Outpatient Medications  Medication Sig Dispense Refill   gabapentin (NEURONTIN) 300 MG capsule Take 1 capsule (300 mg total) by mouth at bedtime. 30 capsule 0   meloxicam (MOBIC) 7.5 MG tablet Take 1 tablet (7.5 mg total) by mouth daily. 30 tablet 0   LENVIMA, 12 MG DAILY DOSE, 3 x 4 MG capsule TAKE 12 MG (3 X 4 MG CAPSULES) BY MOUTH 1 TIME A DAY 90 each 1   losartan-hydrochlorothiazide (HYZAAR) 100-25 MG tablet Take 0.5 tablets by mouth daily. 1 tablet 0   ondansetron (ZOFRAN) 8 MG tablet Take 1 tablet (8 mg total) by mouth every 8 (eight) hours as needed for  nausea or vomiting. 30 tablet 2   prochlorperazine (COMPAZINE) 10 MG tablet Take 1 tablet (10 mg total) by mouth every 6 (six) hours as needed. 30 tablet 2   No current facility-administered medications for this visit.    PHYSICAL EXAMINATION: ECOG PERFORMANCE STATUS: 2 - Symptomatic, <50% confined to bed  Vitals:   06/10/23 1345  BP: (!) 176/79  Pulse: 98  Resp: 18  Temp: 98.2 F (36.8 C)  SpO2: 100%   Wt Readings from Last 3 Encounters:  06/10/23 132 lb 14.4 oz (60.3 kg)  05/30/23 131 lb 4.8 oz (59.6 kg)  04/29/23 131 lb 14.4 oz (59.8 kg)     GENERAL:alert, no distress and comfortable SKIN: skin color, texture, turgor are normal, no rashes or significant lesions EYES: normal, Conjunctiva are pink and non-injected, sclera clear  NECK: supple, thyroid normal size, non-tender, without nodularity LYMPH:  no palpable lymphadenopathy in the cervical, axillary  LUNGS: clear to auscultation and percussion with normal breathing effort HEART: regular rate & rhythm and no murmurs and no lower extremity edema ABDOMEN:abdomen soft, non-tender and normal bowel sounds Musculoskeletal:no cyanosis of digits and no clubbing  NEURO: alert & oriented x 3 with fluent speech, no focal motor/sensory deficits  LABORATORY DATA:  I have reviewed the data as listed    Latest Ref Rng & Units 06/10/2023    1:03 PM 04/29/2023    8:39 AM 03/14/2023   10:02 AM  CBC  WBC 4.0 - 10.5 K/uL 6.9  5.3  6.5   Hemoglobin 13.0 - 17.0 g/dL 16.1  09.6  04.5   Hematocrit 39.0 - 52.0 % 41.6  44.9  44.6   Platelets 150 - 400 K/uL 195  174  170         Latest Ref Rng & Units 06/10/2023    1:03 PM 04/29/2023    8:39 AM 03/14/2023   10:02 AM  CMP  Glucose 70 - 99 mg/dL 409  811  99   BUN 8 - 23 mg/dL 24  25  19    Creatinine 0.61 - 1.24 mg/dL 9.14  7.82  9.56   Sodium 135 - 145 mmol/L 139  140  141   Potassium 3.5 - 5.1 mmol/L 3.7  4.3  4.7   Chloride 98 - 111 mmol/L 106  109  107   CO2 22 - 32 mmol/L 26  25   31    Calcium 8.9 - 10.3 mg/dL 21.3  9.8  10.1   Total Protein 6.5 - 8.1 g/dL 7.6  7.3  8.0   Total Bilirubin 0.3 - 1.2 mg/dL 0.4  0.4  0.3   Alkaline Phos 38 - 126 U/L 65  68  78   AST 15 - 41 U/L 15  18  16    ALT 0 - 44 U/L 10  12  11        RADIOGRAPHIC STUDIES: I have personally reviewed the radiological images as listed and agreed with the findings in the report. No results found.    Orders Placed This Encounter  Procedures   CT Chest Wo Contrast    Standing Status:   Future    Standing Expiration Date:   06/09/2024    Order Specific Question:   Preferred imaging location?    Answer:   [facility]   NM Bone Scan Whole Body    Standing Status:   Future    Standing Expiration Date:   06/09/2024    Order Specific Question:   If indicated for the ordered procedure, I authorize the administration of a radiopharmaceutical per Radiology protocol    Answer:   Yes    Order Specific Question:   Preferred imaging location?    Answer:   [facility]   Ambulatory referral to Radiation Oncology    Referral Priority:   Urgent    Referral Type:   Consultation    Referral Reason:   Specialty Services Required    Requested Specialty:   Radiation Oncology    Number of Visits Requested:   1   All questions were answered. The patient knows to call the clinic with any problems, questions or concerns. No barriers to learning was detected. The total time spent in the appointment was 40 minutes.     Malachy Mood, MD 06/10/2023   Carolin Coy, CMA, am acting as scribe for Malachy Mood, MD.   I have reviewed the above documentation for accuracy and completeness, and I agree with the above.

## 2023-06-11 ENCOUNTER — Telehealth: Payer: Self-pay | Admitting: Hematology

## 2023-06-11 ENCOUNTER — Other Ambulatory Visit: Payer: Self-pay

## 2023-06-12 DIAGNOSIS — C7951 Secondary malignant neoplasm of bone: Secondary | ICD-10-CM | POA: Insufficient documentation

## 2023-06-12 NOTE — Progress Notes (Signed)
Histology and Location of Primary Cancer: Hepatocellular carcinoma  Sites of Visceral and Bony Metastatic Disease: Lumbar spine (L5)  Location(s) of Symptomatic Metastases: Lumbar (L5)  06/07/2023 Dr. Namon Cirri MR Lumbar Spine with/without Contrast CLINICAL DATA:  67 year old male with hepatocellular carcinoma and L5 metastasis (biopsy proven 07/17/2022). Restaging.  IMPRESSION: 1. Progressed L5 vertebral body metastasis since August is larger, with bulky retropulsion/epidural extension of tumor resulting in up to severe new lateral recess and moderate spinal stenosis at the descending L5 and S1 nerve levels. 2. No new lumbar metastatic disease identified.  04/23/2023 Dr. Malachy Mood MR Abdomen with/without Contrast CLINICAL DATA: History of hepatocellular carcinoma prior ablation   IMPRESSION: 1. Right hepatic ablation site, without local recurrence. 2. The segment 2 liver lesion on prior CT is only equivocally identified on T2 weighted imaging. The caudate lobe 1.2 cm lesion on prior CT is identified and has imaging characteristics which are considered indeterminate for hepatocellular carcinoma (LR 3). This appears decreased in size compared to February, given cross modality comparison. 3. Foci of arterial phase hyperenhancement are likely perfusion anomalies but technically considered LR 3-indeterminate probability. 4. Mild enlargement of a known L5 osseous metastasis (biopsy proven 07/17/2022).  09/19/2022 Cassandra Heilingoetter, PA-C NM Bone Scan Whole Body CLINICAL DATA:  Stage IV hepatocellular carcinoma. L5 bone lesion identified on MRI. * Tracking Code: BO *  IMPRESSION: No scintigraphic evidence of skeletal metastasis. Particular attention directed to the L5 vertebral body.  Past/Anticipated chemotherapy by medical oncology, if any:   Dr. Malachy Mood   Pain on a scale of 0-10 is:  6/10 right hip radiates down leg and also lower back.   If Spine  Met(s), symptoms, if any, include: Bowel/Bladder retention or incontinence (please describe): No Numbness or weakness in extremities (please describe): No Current Decadron regimen, if applicable: No  Ambulatory status? Walker? Wheelchair?: Independent  SAFETY ISSUES: Prior radiation? No Pacemaker/ICD? No Possible current pregnancy? Male Is the patient on methotrexate? No  Current Complaints / other details:

## 2023-06-12 NOTE — Progress Notes (Signed)
Radiation Oncology         [phone] ________________________________  Initial outpatient Consultation  Name: Corey Der Sr. 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  The primary encounter diagnosis was Metastasis to bone [facility]). A diagnosis of Hepatocellular carcinoma (HCC) was also pertinent to this visit.    ICD-10-CM   1. Metastasis to bone (HCC)  C79.51     2. Hepatocellular carcinoma (HCC)  C22.0       HISTORY OF PRESENT ILLNESS::Corey Levon Haith Sr. is a 67 y.o. male who is being seen at the request of Dr. Mosetta Putt. He was diagnosed with hepatocellular carcinoma in January of 2021 by MRI. He underwent microwave ablation on 03/01/20. Follow-up MRI on 06/01/22 was suspicious for local recurrence and metastatic disease to L5. Biopsy of L5 confirmed HCC. Patient underwent repeat microwave ablation on 07/17/22 and was started on Lenvima on 08/16/22 under the care of Dr. Mosetta Putt.   Most recently, the patient underwent follow-up MRI on 04/23/23 that showed the treated liver lesion to be stable and the L5 bone met to have slightly increased in size. Patient developed worsening low back pain in July of 2024. Subsequent lumbar MRI showed significant disease progression in the L5 bone lesion. Dr. Mosetta Putt recommended changing from systemic treatment to immunotherapy, consisting of durvalumab and Imjudo, due to disease progression.   She reviewed the MRI findings with the patient and kindly referred him to discuss palliative radiation to the L5 bone lesion.    PREVIOUS RADIATION THERAPY: No  Past Medical History:  Diagnosis Date   Cancer (HCC)    Headache    migraines   Hepatitis    HX OF hEP c ?    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  :   Past Surgical History:  Procedure Laterality Date   IR RADIOLOGIST EVAL & MGMT   01/25/2020   IR RADIOLOGIST EVAL & MGMT  03/21/2020   IR RADIOLOGIST EVAL & MGMT  06/07/2020   IR RADIOLOGIST EVAL & MGMT  09/05/2020   IR RADIOLOGIST EVAL & MGMT  12/05/2020   IR RADIOLOGIST EVAL & MGMT  05/09/2021   IR RADIOLOGIST EVAL & MGMT  09/06/2021   IR RADIOLOGIST EVAL & MGMT  12/12/2021   IR RADIOLOGIST EVAL & MGMT  06/25/2022   IR RADIOLOGIST EVAL & MGMT  07/24/2022   RADIOLOGY WITH ANESTHESIA N/A 03/01/2020   Procedure: CT WITH ANESTHESIA  THERMAL MICROWAVE  ABLATIION;  Surgeon: [name], MD;  Location: [facility];  Service: Anesthesiology;  Laterality: N/A;   RADIOLOGY WITH ANESTHESIA N/A 07/17/2022   Procedure: CT MICROWAVE ABLATION;  Surgeon: [name], MD;  Location: [facility];  Service: Radiology;  Laterality: N/A;  :   Current Outpatient Medications:    gabapentin (NEURONTIN) 300 MG capsule, Take 1 capsule (300 mg total) by mouth at bedtime., Disp: 30 capsule, Rfl: 0   losartan-hydrochlorothiazide (HYZAAR) 100-25 MG tablet, Take 0.5 tablets by mouth daily., Disp: 1 tablet, Rfl: 0   meloxicam (MOBIC) 7.5 MG tablet, Take 1 tablet (7.5 mg total) by mouth daily., Disp: 30 tablet, Rfl: 0:  No Known Allergies:   Family History  Problem Relation Age of Onset   Arthritis Mother    Cancer Neg Hx    Diabetes Neg Hx    Drug abuse Neg Hx    Heart disease Neg  Hx    Hyperlipidemia Neg Hx    Hypertension Neg Hx    Kidney disease Neg Hx    Stroke Neg Hx   :   Social History   Socioeconomic History   Marital status: Divorced    Spouse name: Not on file   Number of children: 2   Years of education: 12   Highest education level: Not on file  Occupational History   Occupation: Builds snowblowers    Employer: [employer]  Tobacco Use   Smoking status: Former    Passive exposure: Current   Smokeless tobacco: Never   Tobacco comments:    3 per week  Vaping Use   Vaping status: Never Used  Substance and Sexual Activity   Alcohol use: Not Currently   Drug use: No    Sexual activity: Not Currently  Other Topics Concern   Not on file  Social History Narrative   Divorced but has relationship with ex   2 children   Daughter lives with him   Son in [place]      No living will   Ex wife Myraette should make decisions on his behalf   Would accept resuscitation   Not sure about tube feeds   Social Determinants of Health   Financial Resource Strain: Not on file  Food Insecurity: No Food Insecurity (06/13/2023)   Hunger Vital Sign    Worried About Running Out of Food in the Last Year: Never true    Ran Out of Food in the Last Year: Never true  Transportation Needs: No Transportation Needs (06/13/2023)   PRAPARE - Administrator, Civil Service (Medical): No    Lack of Transportation (Non-Medical): No  Physical Activity: Not on file  Stress: Not on file  Social Connections: Not on file  Intimate Partner Violence: Not At Risk (06/13/2023)   Humiliation, Afraid, Rape, and Kick questionnaire    Fear of Current or Ex-Partner: No    Emotionally Abused: No    Physically Abused: No    Sexually Abused: No  :  REVIEW OF SYSTEMS:  Patient reports 6/10 pain in his lower back along the spine that radiates down his right leg to his right foot. He is currently taking gabapentin and He also endorses decreased sensation to his right leg and foot. He denies any urinary incontinence.    PHYSICAL EXAM:  Blood pressure (!) 130/58, pulse 75, temperature (!) 97.5 F (36.4 C), resp. rate 20, height 5' 7.5" (1.715 m), weight 133 lb (60.3 kg), SpO2 100%. In general this is a well appearing male in no acute distress. He's alert and oriented x4 and appropriate throughout the examination. Cardiopulmonary assessment is negative for acute distress and he exhibits normal effort.     Exquisite tenderness to palpation of the lumbar spine. Decreased sensation throughout the right lower extremity. 5/5 strength in the lower extremities.   KPS = 60  100 - Normal; no  complaints; no evidence of disease. 90   - Able to carry on normal activity; minor signs or symptoms of disease. 80   - Normal activity with effort; some signs or symptoms of disease. 107   - Cares for self; unable to carry on normal activity or to do active work. 60   - Requires occasional assistance, but is able to care for most of his personal needs. 50   - Requires considerable assistance and frequent medical care. 40   - Disabled; requires special care and assistance. 30   -  Severely disabled; hospital admission is indicated although death not imminent. 20   - Very sick; hospital admission necessary; active supportive treatment necessary. 10   - Moribund; fatal processes progressing rapidly. 0     - Dead  Karnofsky DA, Abelmann WH, Craver LS and Burchenal [facility] [phone]) The use of the nitrogen mustards in the palliative treatment of carcinoma: with particular reference to bronchogenic carcinoma Cancer 1 634-56  LABORATORY DATA:  Lab Results  Component Value Date   WBC 6.9 06/10/2023   HGB 13.9 06/10/2023   HCT 41.6 06/10/2023   MCV 95.6 06/10/2023   PLT 195 06/10/2023   Lab Results  Component Value Date   NA 139 06/10/2023   K 3.7 06/10/2023   CL 106 06/10/2023   CO2 26 06/10/2023   Lab Results  Component Value Date   ALT 10 06/10/2023   AST 15 06/10/2023   ALKPHOS 65 06/10/2023   BILITOT 0.4 06/10/2023     RADIOGRAPHY: MR Lumbar Spine W Wo Contrast  Result Date: 06/07/2023 CLINICAL DATA:  67 year old male with hepatocellular carcinoma and L5 metastasis (biopsy proven 07/17/2022). Restaging. EXAM: MRI LUMBAR SPINE WITHOUT AND WITH CONTRAST TECHNIQUE: Multiplanar and multiecho pulse sequences of the lumbar spine were obtained without and with intravenous contrast. CONTRAST:  5mL GADAVIST GADOBUTROL 1 MMOL/ML IV SOLN COMPARISON:  CT Abdomen and Pelvis 01/02/2023. Lumbar MRI 06/18/2022. FINDINGS: Segmentation: Normal on the CT this year, the same numbering system used on the MRI  last year. Alignment: Stable to mildly improved straightening of lumbar lordosis since last year. No significant scoliosis or spondylolisthesis. Vertebrae: Enlarged area of abnormal L5 vertebral body marrow signal and enhancement, now up to 31 mm AP (16 mm last year) and now with retropulsion/epidural extension of the tumor there (series 9, image 32) resulting in up to severe lateral recess stenosis at the descending L5 and S1 nerve levels, mild to moderate spinal stenosis (series 8, image 32). Pathologic fracture of the posteroinferior L5 body, but relatively maintained L5 vertebral body height. No tumor extension into the pedicles or posterior elements. Intrinsic increased marrow signal in some of the visible sacrum and pelvis perhaps from prior radiation. Marrow signal remains within normal limits at the other lumbar levels. No new sites of marrow edema or enhancement are identified. Conus medullaris and cauda equina: Conus extends to the T12-L1 level. No lower spinal cord or conus signal abnormality. Cauda equina nerve roots remain normal. No abnormal intradural enhancement. No dural thickening or enhancement at this time. Paraspinal and other soft tissues: Simple appearing renal cysts (no follow-up imaging recommended). No prevertebral/retroperitoneal lymphadenopathy. Lumbar paraspinal soft tissues remain within normal limits. Disc levels: Mild for age lumbar spine degeneration. No significant non malignant spinal stenosis. IMPRESSION: 1. Progressed L5 vertebral body metastasis since August is larger, with bulky retropulsion/epidural extension of tumor resulting in up to severe new lateral recess and moderate spinal stenosis at the descending L5 and S1 nerve levels. 2. No new lumbar metastatic disease identified. Electronically Signed   By: Odessa Fleming M.D.   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  Today we discussed the  workup and natural course of painful bone lesions, highlighting the role of radiotherapy in the management. Most recent imaging indicates enlarging L5 lesion. Patient is experiencing worsening pain due to progression of disease. He is a good candidate for palliative radiotherapy to decrease his symptoms and reduce the risk of further  disease spread. We discussed the available radiation techniques, and focused on the details and logistics of delivery. We discussed and outlined the risks, benefits, short and long-term effects associated with radiotherapy. He was encouraged to ask questions that were answered to his stated satisfaction. He expressed understanding of the treatment and would like to proceed. A consent form was signed and placed in his chart today.    PLAN: Patient is scheduled for CT simulation later today. Anticipate 5 fractions of SBRT to the L5 metastatic lesion. His first treatment is scheduled for 06/17/23. We look forward to participating in this patient's care.   I spent 60 minutes minutes face to face with the patient and more than 50% of that time was spent in counseling and/or coordination of care.   ------------------------------------------------   Joyice Faster, PA-C    Margaretmary Dys, MD  [facility]  Radiation Oncology Direct Dial: [phone]  Fax: [phone] [email or web address]  Skype  LinkedIn

## 2023-06-13 ENCOUNTER — Other Ambulatory Visit: Payer: Self-pay | Admitting: Hematology

## 2023-06-13 ENCOUNTER — Ambulatory Visit
Admission: RE | Admit: 2023-06-13 | Discharge: 2023-06-13 | Disposition: A | Discharge status: Home / Self Care | Payer: Commercial Managed Care - PPO | Source: Ambulatory Visit | Attending: Radiation Oncology | Admitting: Radiation Oncology

## 2023-06-13 ENCOUNTER — Encounter: Payer: Self-pay | Admitting: Radiation Oncology

## 2023-06-13 ENCOUNTER — Other Ambulatory Visit: Payer: Self-pay

## 2023-06-13 VITALS — BP 130/58 | HR 75 | Temp 97.5°F | Resp 20 | Ht 67.5 in | Wt 133.0 lb

## 2023-06-13 DIAGNOSIS — Z5112 Encounter for antineoplastic immunotherapy: Secondary | ICD-10-CM | POA: Diagnosis not present

## 2023-06-13 DIAGNOSIS — I10 Essential (primary) hypertension: Secondary | ICD-10-CM | POA: Diagnosis not present

## 2023-06-13 DIAGNOSIS — C7951 Secondary malignant neoplasm of bone: Secondary | ICD-10-CM | POA: Insufficient documentation

## 2023-06-13 DIAGNOSIS — Z87891 Personal history of nicotine dependence: Secondary | ICD-10-CM | POA: Diagnosis not present

## 2023-06-13 DIAGNOSIS — Z791 Long term (current) use of non-steroidal anti-inflammatories (NSAID): Secondary | ICD-10-CM | POA: Diagnosis not present

## 2023-06-13 DIAGNOSIS — C220 Liver cell carcinoma: Secondary | ICD-10-CM | POA: Insufficient documentation

## 2023-06-13 DIAGNOSIS — I2510 Atherosclerotic heart disease of native coronary artery without angina pectoris: Secondary | ICD-10-CM | POA: Diagnosis not present

## 2023-06-13 DIAGNOSIS — N281 Cyst of kidney, acquired: Secondary | ICD-10-CM | POA: Insufficient documentation

## 2023-06-13 DIAGNOSIS — N200 Calculus of kidney: Secondary | ICD-10-CM | POA: Diagnosis not present

## 2023-06-13 DIAGNOSIS — Z510 Encounter for antineoplastic radiation therapy: Secondary | ICD-10-CM | POA: Insufficient documentation

## 2023-06-13 DIAGNOSIS — K5900 Constipation, unspecified: Secondary | ICD-10-CM | POA: Diagnosis not present

## 2023-06-13 DIAGNOSIS — K7460 Unspecified cirrhosis of liver: Secondary | ICD-10-CM | POA: Diagnosis not present

## 2023-06-13 DIAGNOSIS — R911 Solitary pulmonary nodule: Secondary | ICD-10-CM | POA: Diagnosis not present

## 2023-06-13 DIAGNOSIS — Z79899 Other long term (current) drug therapy: Secondary | ICD-10-CM | POA: Diagnosis not present

## 2023-06-13 DIAGNOSIS — Z923 Personal history of irradiation: Secondary | ICD-10-CM | POA: Diagnosis not present

## 2023-06-13 DIAGNOSIS — I700 Atherosclerosis of aorta: Secondary | ICD-10-CM | POA: Diagnosis not present

## 2023-06-13 DIAGNOSIS — G893 Neoplasm related pain (acute) (chronic): Secondary | ICD-10-CM | POA: Diagnosis not present

## 2023-06-15 NOTE — Progress Notes (Signed)
  Radiation Oncology         [phone]) [phone] ________________________________  Name: Corey Armstrong Sr. MRN: [id number]  Date: 06/13/2023  DOB: 07/25/1956  STEREOTACTIC BODY RADIOTHERAPY SIMULATION AND TREATMENT PLANNING NOTE    ICD-10-CM   1. 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  NARRATIVE:  The patient was brought to the CT Simulation planning suite.  Identity was confirmed.  All relevant records and images related to the planned course of therapy were reviewed.  The patient freely provided informed written consent to proceed with treatment after reviewing the details related to the planned course of therapy. The consent form was witnessed and verified by the simulation staff.  Then, the patient was set-up in a stable reproducible  supine position for radiation therapy.  A BodyFix immobilization pillow was fabricated for reproducible positioning.  Surface markings were placed.  The CT images were loaded into the planning software.  The gross target volumes (GTV) and planning target volumes (PTV) were delinieated, and avoidance structures were contoured.  Treatment planning then occurred.  The radiation prescription was entered and confirmed.  A total of two complex treatment devices were fabricated in the form of the BodyFix immobilization pillow and a neck accuform cushion.  I have requested : 3D Simulation  I have requested a DVH of the following structures: targets and all normal structures near the target including bowel, kidneys, cauda equina, skin and others as noted on the radiation plan to maintain doses in adherence with established limits  SPECIAL TREATMENT PROCEDURE:  The planned course of therapy using radiation constitutes a special treatment procedure. Special care is required in the management of this patient for the following reasons. High dose per fraction requiring special  monitoring for increased toxicities of treatment including daily imaging..  The special nature of the planned course of radiotherapy will require increased physician supervision and oversight to ensure patient's safety with optimal treatment outcomes.    This requires extended time and effort.    PLAN:  The patient will receive 50 Gy in 5 fractions.  ________________________________  Artist Pais Kathrynn Running, M.D.

## 2023-06-16 DIAGNOSIS — Z5112 Encounter for antineoplastic immunotherapy: Secondary | ICD-10-CM | POA: Diagnosis not present

## 2023-06-17 ENCOUNTER — Ambulatory Visit
Admission: RE | Admit: 2023-06-17 | Discharge: 2023-06-17 | Disposition: A | Discharge status: Home / Self Care | Payer: Commercial Managed Care - PPO | Source: Ambulatory Visit | Attending: Radiation Oncology | Admitting: Radiation Oncology

## 2023-06-17 ENCOUNTER — Telehealth: Payer: Self-pay | Admitting: *Deleted

## 2023-06-17 ENCOUNTER — Other Ambulatory Visit: Payer: Self-pay

## 2023-06-17 DIAGNOSIS — Z5112 Encounter for antineoplastic immunotherapy: Secondary | ICD-10-CM | POA: Diagnosis not present

## 2023-06-17 LAB — RAD ONC ARIA SESSION SUMMARY
Course Elapsed Days: 0
Plan Fractions Treated to Date: 1
Plan Prescribed Dose Per Fraction: 10 Gy
Plan Total Fractions Prescribed: 5
Plan Total Prescribed Dose: 50 Gy
Reference Point Dosage Given to Date: 10 Gy
Reference Point Session Dosage Given: 10 Gy
Session Number: 1

## 2023-06-17 NOTE — Telephone Encounter (Signed)
This nurse working remotely connected with Corey Der Sr., [phone] (home) regarding Corey Armstrong Disability claim no.# [id number].  Corey Armstrong also on call.  Advised of [facility] Cover Sheet and [place] Authorization form needed to correctly process paperwork and release physical copies of records to [place].  Directed to form staff offices as he is currently in office.  Also advised to complete his part of the [place] form and leave for this nurse to return with form.  Connect with Corey Armstrong to resend request with correct spelling of of provider and Corey Armstrong legal name for (SW) H.I.M to comply with request for medical record request.  Advised to complete [place] [employer] Voucher for MD to sign and copy to be scanned to EMR.  Denies further questions.  Form will be shared with provider to review, correct, sign and return for form staff to complete process.

## 2023-06-18 ENCOUNTER — Ambulatory Visit: Payer: Commercial Managed Care - PPO

## 2023-06-18 ENCOUNTER — Encounter: Payer: Self-pay | Admitting: Hematology

## 2023-06-19 ENCOUNTER — Ambulatory Visit: Payer: Commercial Managed Care - PPO

## 2023-06-20 ENCOUNTER — Other Ambulatory Visit: Payer: Self-pay

## 2023-06-20 ENCOUNTER — Other Ambulatory Visit: Payer: Self-pay | Admitting: Radiation Oncology

## 2023-06-20 ENCOUNTER — Ambulatory Visit
Admission: RE | Admit: 2023-06-20 | Discharge: 2023-06-20 | Disposition: A | Discharge status: Home / Self Care | Payer: Commercial Managed Care - PPO | Source: Ambulatory Visit | Attending: Radiation Oncology | Admitting: Radiation Oncology

## 2023-06-20 ENCOUNTER — Ambulatory Visit: Payer: Commercial Managed Care - PPO

## 2023-06-20 DIAGNOSIS — Z5112 Encounter for antineoplastic immunotherapy: Secondary | ICD-10-CM | POA: Diagnosis not present

## 2023-06-20 LAB — RAD ONC ARIA SESSION SUMMARY
Course Elapsed Days: 3
Plan Fractions Treated to Date: 2
Plan Prescribed Dose Per Fraction: 10 Gy
Plan Total Fractions Prescribed: 5
Plan Total Prescribed Dose: 50 Gy
Reference Point Dosage Given to Date: 20 Gy
Reference Point Session Dosage Given: 10 Gy
Session Number: 2

## 2023-06-20 MED ORDER — OXYCODONE HCL 5 MG PO TABS: 5.0000 mg | ORAL_TABLET | Freq: Four times a day (QID) | ORAL | 0 refills | Status: DC | PRN

## 2023-06-21 ENCOUNTER — Ambulatory Visit: Payer: Commercial Managed Care - PPO

## 2023-06-23 ENCOUNTER — Other Ambulatory Visit: Payer: Self-pay

## 2023-06-23 ENCOUNTER — Ambulatory Visit
Admission: RE | Admit: 2023-06-23 | Discharge: 2023-06-23 | Disposition: A | Discharge status: Home / Self Care | Payer: Commercial Managed Care - PPO | Source: Ambulatory Visit | Attending: Radiation Oncology | Admitting: Radiation Oncology

## 2023-06-23 DIAGNOSIS — Z5112 Encounter for antineoplastic immunotherapy: Secondary | ICD-10-CM | POA: Diagnosis not present

## 2023-06-23 LAB — RAD ONC ARIA SESSION SUMMARY
Course Elapsed Days: 6
Plan Fractions Treated to Date: 3
Plan Prescribed Dose Per Fraction: 10 Gy
Plan Total Fractions Prescribed: 5
Plan Total Prescribed Dose: 50 Gy
Reference Point Dosage Given to Date: 30 Gy
Reference Point Session Dosage Given: 10 Gy
Session Number: 3

## 2023-06-23 NOTE — Progress Notes (Signed)
Pharmacist Chemotherapy Monitoring - Initial Assessment    Anticipated start date: 06/30/23   The following has been reviewed per standard work regarding the patient's treatment regimen: The patient's diagnosis, treatment plan and drug doses, and organ/hematologic function Lab orders and baseline tests specific to treatment regimen  The treatment plan start date, drug sequencing, and pre-medications Prior authorization status  Patient's documented medication list, including drug-drug interaction screen and prescriptions for anti-emetics and supportive care specific to the treatment regimen The drug concentrations, fluid compatibility, administration routes, and timing of the medications to be used The patient's access for treatment and lifetime cumulative dose history, if applicable  The patient's medication allergies and previous infusion related reactions, if applicable   Changes made to treatment plan:  N/A  Follow up needed:  N/A   Janeice Robinson, RPH, 06/23/2023  2:58 PM

## 2023-06-25 ENCOUNTER — Ambulatory Visit (HOSPITAL_COMMUNITY)
Admission: RE | Admit: 2023-06-25 | Discharge: 2023-06-25 | Disposition: A | Discharge status: Home / Self Care | Payer: Commercial Managed Care - PPO | Source: Ambulatory Visit | Attending: Hematology | Admitting: Hematology

## 2023-06-25 ENCOUNTER — Ambulatory Visit: Payer: Commercial Managed Care - PPO

## 2023-06-25 ENCOUNTER — Ambulatory Visit
Admission: RE | Admit: 2023-06-25 | Discharge: 2023-06-25 | Disposition: A | Discharge status: Home / Self Care | Payer: Commercial Managed Care - PPO | Source: Ambulatory Visit | Attending: Radiation Oncology | Admitting: Radiation Oncology

## 2023-06-25 ENCOUNTER — Other Ambulatory Visit: Payer: Self-pay

## 2023-06-25 ENCOUNTER — Encounter (HOSPITAL_COMMUNITY): Payer: Self-pay

## 2023-06-25 DIAGNOSIS — C220 Liver cell carcinoma: Secondary | ICD-10-CM | POA: Diagnosis present

## 2023-06-25 DIAGNOSIS — Z5112 Encounter for antineoplastic immunotherapy: Secondary | ICD-10-CM | POA: Diagnosis not present

## 2023-06-25 LAB — RAD ONC ARIA SESSION SUMMARY
Course Elapsed Days: 8
Plan Fractions Treated to Date: 4
Plan Prescribed Dose Per Fraction: 10 Gy
Plan Total Fractions Prescribed: 5
Plan Total Prescribed Dose: 50 Gy
Reference Point Dosage Given to Date: 40 Gy
Reference Point Session Dosage Given: 10 Gy
Session Number: 4

## 2023-06-25 MED ORDER — TECHNETIUM TC 99M MEDRONATE IV KIT
20.0000 | PACK | Freq: Once | INTRAVENOUS | Status: AC | PRN
  Administered 2023-06-25: 21.1 via INTRAVENOUS

## 2023-06-27 ENCOUNTER — Other Ambulatory Visit: Payer: Self-pay

## 2023-06-27 ENCOUNTER — Ambulatory Visit: Payer: Commercial Managed Care - PPO

## 2023-06-27 ENCOUNTER — Encounter: Payer: Self-pay | Admitting: Hematology

## 2023-06-27 ENCOUNTER — Inpatient Hospital Stay: Payer: Commercial Managed Care - PPO | Attending: Hematology

## 2023-06-27 ENCOUNTER — Inpatient Hospital Stay: Payer: Commercial Managed Care - PPO

## 2023-06-27 ENCOUNTER — Inpatient Hospital Stay (HOSPITAL_BASED_OUTPATIENT_CLINIC_OR_DEPARTMENT_OTHER): Payer: Commercial Managed Care - PPO | Admitting: Nurse Practitioner

## 2023-06-27 ENCOUNTER — Ambulatory Visit: Admission: RE | Admit: 2023-06-27 | Payer: Commercial Managed Care - PPO | Source: Ambulatory Visit

## 2023-06-27 VITALS — BP 159/76 | HR 83 | Temp 97.7°F | Resp 16 | Wt 133.3 lb

## 2023-06-27 DIAGNOSIS — Z87891 Personal history of nicotine dependence: Secondary | ICD-10-CM | POA: Insufficient documentation

## 2023-06-27 DIAGNOSIS — C7951 Secondary malignant neoplasm of bone: Secondary | ICD-10-CM

## 2023-06-27 DIAGNOSIS — R911 Solitary pulmonary nodule: Secondary | ICD-10-CM | POA: Insufficient documentation

## 2023-06-27 DIAGNOSIS — Z5112 Encounter for antineoplastic immunotherapy: Secondary | ICD-10-CM | POA: Insufficient documentation

## 2023-06-27 DIAGNOSIS — N281 Cyst of kidney, acquired: Secondary | ICD-10-CM | POA: Insufficient documentation

## 2023-06-27 DIAGNOSIS — C220 Liver cell carcinoma: Secondary | ICD-10-CM

## 2023-06-27 DIAGNOSIS — I2510 Atherosclerotic heart disease of native coronary artery without angina pectoris: Secondary | ICD-10-CM | POA: Insufficient documentation

## 2023-06-27 DIAGNOSIS — K7460 Unspecified cirrhosis of liver: Secondary | ICD-10-CM | POA: Insufficient documentation

## 2023-06-27 DIAGNOSIS — G893 Neoplasm related pain (acute) (chronic): Secondary | ICD-10-CM | POA: Insufficient documentation

## 2023-06-27 DIAGNOSIS — M545 Low back pain, unspecified: Secondary | ICD-10-CM | POA: Diagnosis not present

## 2023-06-27 DIAGNOSIS — Z923 Personal history of irradiation: Secondary | ICD-10-CM | POA: Insufficient documentation

## 2023-06-27 DIAGNOSIS — K5900 Constipation, unspecified: Secondary | ICD-10-CM | POA: Insufficient documentation

## 2023-06-27 DIAGNOSIS — N200 Calculus of kidney: Secondary | ICD-10-CM | POA: Insufficient documentation

## 2023-06-27 DIAGNOSIS — M79604 Pain in right leg: Secondary | ICD-10-CM | POA: Diagnosis not present

## 2023-06-27 DIAGNOSIS — M79605 Pain in left leg: Secondary | ICD-10-CM | POA: Insufficient documentation

## 2023-06-27 DIAGNOSIS — I700 Atherosclerosis of aorta: Secondary | ICD-10-CM | POA: Insufficient documentation

## 2023-06-27 DIAGNOSIS — I10 Essential (primary) hypertension: Secondary | ICD-10-CM | POA: Insufficient documentation

## 2023-06-27 DIAGNOSIS — Z791 Long term (current) use of non-steroidal anti-inflammatories (NSAID): Secondary | ICD-10-CM | POA: Insufficient documentation

## 2023-06-27 DIAGNOSIS — Z79899 Other long term (current) drug therapy: Secondary | ICD-10-CM | POA: Insufficient documentation

## 2023-06-27 DIAGNOSIS — Z510 Encounter for antineoplastic radiation therapy: Secondary | ICD-10-CM | POA: Insufficient documentation

## 2023-06-27 LAB — CBC WITH DIFFERENTIAL (CANCER CENTER ONLY)
Abs Immature Granulocytes: 0.02 10*3/uL (ref 0.00–0.07)
Basophils Absolute: 0 10*3/uL (ref 0.0–0.1)
Basophils Relative: 1 %
Eosinophils Absolute: 0.2 10*3/uL (ref 0.0–0.5)
Eosinophils Relative: 2 %
HCT: 39.6 % (ref 39.0–52.0)
Hemoglobin: 12.9 g/dL — ABNORMAL LOW (ref 13.0–17.0)
Immature Granulocytes: 0 %
Lymphocytes Relative: 17 %
Lymphs Abs: 1.1 10*3/uL (ref 0.7–4.0)
MCH: 32.7 pg (ref 26.0–34.0)
MCHC: 32.6 g/dL (ref 30.0–36.0)
MCV: 100.5 fL — ABNORMAL HIGH (ref 80.0–100.0)
Monocytes Absolute: 0.5 10*3/uL (ref 0.1–1.0)
Monocytes Relative: 8 %
Neutro Abs: 4.5 10*3/uL (ref 1.7–7.7)
Neutrophils Relative %: 72 %
Platelet Count: 209 10*3/uL (ref 150–400)
RBC: 3.94 MIL/uL — ABNORMAL LOW (ref 4.22–5.81)
RDW: 13.5 % (ref 11.5–15.5)
WBC Count: 6.3 10*3/uL (ref 4.0–10.5)
nRBC: 0 % (ref 0.0–0.2)

## 2023-06-27 LAB — CMP (CANCER CENTER ONLY)
ALT: 15 U/L (ref 0–44)
AST: 17 U/L (ref 15–41)
Albumin: 4.3 g/dL (ref 3.5–5.0)
Alkaline Phosphatase: 79 U/L (ref 38–126)
Anion gap: 5 (ref 5–15)
BUN: 31 mg/dL — ABNORMAL HIGH (ref 8–23)
CO2: 24 mmol/L (ref 22–32)
Calcium: 9.8 mg/dL (ref 8.9–10.3)
Chloride: 113 mmol/L — ABNORMAL HIGH (ref 98–111)
Creatinine: 1.27 mg/dL — ABNORMAL HIGH (ref 0.61–1.24)
GFR, Estimated: >60 mL/min (ref >60)
Glucose, Bld: 120 mg/dL — ABNORMAL HIGH (ref 70–99)
Potassium: 3.9 mmol/L (ref 3.5–5.1)
Sodium: 142 mmol/L (ref 135–145)
Total Bilirubin: 0.4 mg/dL (ref 0.3–1.2)
Total Protein: 7.9 g/dL (ref 6.5–8.1)

## 2023-06-27 LAB — RAD ONC ARIA SESSION SUMMARY
Course Elapsed Days: 10
Plan Fractions Treated to Date: 5
Plan Prescribed Dose Per Fraction: 10 Gy
Plan Total Fractions Prescribed: 5
Plan Total Prescribed Dose: 50 Gy
Reference Point Dosage Given to Date: 50 Gy
Reference Point Session Dosage Given: 10 Gy
Session Number: 5

## 2023-06-27 LAB — TSH: TSH: 0.983 u[IU]/mL (ref 0.350–4.500)

## 2023-06-27 MED ORDER — MELOXICAM 15 MG PO TABS
15.0000 mg | ORAL_TABLET | Freq: Every day | ORAL | 0 refills | Status: DC
Start: 2023-06-27 — End: 2023-07-15

## 2023-06-27 MED ORDER — OXYCODONE HCL 5 MG PO TABS
5.0000 mg | ORAL_TABLET | Freq: Four times a day (QID) | ORAL | 0 refills | Status: DC | PRN
Start: 2023-06-27 — End: 2023-07-02

## 2023-06-27 MED ORDER — GABAPENTIN 300 MG PO CAPS
600.0000 mg | ORAL_CAPSULE | Freq: Every day | ORAL | 0 refills | Status: DC
Start: 2023-06-27 — End: 2023-07-01

## 2023-06-27 NOTE — Progress Notes (Signed)
 Called pt to introduce myself as his Dance movement psychotherapist and to discuss the [employer].  I went over what the grant covers but he declined it at this time so I will request the registration staff give him my card in case he changes his mind and for any questions or concerns he may have in the future.

## 2023-06-27 NOTE — Assessment & Plan Note (Addendum)
diagnosed 11/2019 by MRI for f/u of cirrhosis. S/p microwave ablation on 03/01/20. -MRI 06/01/22 showed suspicion for local recurrence and metastasis at L5. Biopsy of L5 confirmed metastatic HCC. S/p repeat microwave ablation on 07/17/22. -started lenvima 08/16/22 -staging CT CAP and bone scan on 09/19/22 showed: interval enlargement of right hepatic lobe HCC; new retropulsed bony fragment at L5; no other new lesions. His previous CT was in 05/2022 and he did not start Lenvima until 07/2022, so it's hard to evaluate if he is responding to [facility] or not, the progression is likely the nature course of his [facility]  -Restaging CT scan from January 02, 2023 showed slightly improved previously treated liver lesion, and additional area of concern in the liver which are difficult to compare with previous scan due to different timing of contrast.  No other evidence of disease progression.  Plan to obtain MRI as next scan.  I personally reviewed the CT scan images and discussed the findings with patient -Will continue Lenvima, he is tolerating well overall. -Due to his weight being above 60 kg now, I increased his Lenvima to 12 mg daily in Feb 2024 -repeated liver MRI on 04/23/2023 showed stable treated liver lesion, L5 bone mets has slightly increased in size.  Patient developed worsening low back pain in July 2024, repeated lumbar MRI showed significant disease progression in the L5 bone lesion.  -I 06/27/2023 - will receive last radiation treatment to spinal lesion today  -I recommend changing systemic treatment to immunotherapy. I recommend him to proceed with durvalumab and Imjudo -initial immunotherapy treatment to start 06/30/2023  -the goal of therapy is curative/palliative to prolong life and prevent/improve cancer related symptoms. 06/25/2023 - CT Chest showing: 1. No acute findings within the chest. No suspicious findings for metastatic disease within the chest. 2. Unchanged 3 mm nodule within the medial right upper  lobe. No new or suspicious lung nodules. 3. Suboptimal visualization of the known liver lesions, reflecting lack of IV contrast material. Similar appearance of ablation defect within the lateral right lobe of liver. 4. Left renal calculi. 5. Coronary artery calcifications. 6.  Aortic Atherosclerosis 06/25/2023 NM whole body bone scan showing: 1. Known L5 metastasis demonstrates low level uptake, mildly increased from previous bone scan. 2. No new lesions demonstrated. Labs reviewed  -CBC showing WBC 6.3; Hgb12.9; Hct 39.6; Plt 209; Anc 4.5 -CMP - K 3.9; glucose 120; BUN 31; Creatinine 1.27; eGFR > 60  Ca 9.8; LFTs normal.  06/27/2023 - will have chemo education class later today  Plan to proceed with immunotherapy on 06/30/2023

## 2023-06-27 NOTE — Assessment & Plan Note (Signed)
Pain in lower back, hips, and legs worse over past few weeks. States that current medications for pain not really working.  -increased dose meloxicam to 15 mg daily. -may take 1 - 2 tablets oxycodone up to Q6 hours as needed for severe pain.  -recommended he take 2 capsules gabapentin at night to help with burning pain in lower legs which is most severe at night and wakes him up. He understands not to take gabapentin and oxycodone at the same time.  He takes oxycodone only as needed and understands not to take this medication if he is going to be driving or going to work.

## 2023-06-27 NOTE — Progress Notes (Unsigned)
Patient Care Team: [name], MD as PCP - General (Internal Medicine) Malachy Mood, MD as Attending Physician (Hematology and Oncology)  Clinic Day:  06/27/2023  Referring physician: [name], MD  ASSESSMENT & PLAN:   Assessment & Plan: Hepatocellular carcinoma (HCC) diagnosed 11/2019 by MRI for f/u of cirrhosis. S/p microwave ablation on 03/01/20. -MRI 06/01/22 showed suspicion for local recurrence and metastasis at L5. Biopsy of L5 confirmed metastatic HCC. S/p repeat microwave ablation on 07/17/22. -started lenvima 08/16/22 -staging CT CAP and bone scan on 09/19/22 showed: interval enlargement of right hepatic lobe HCC; new retropulsed bony fragment at L5; no other new lesions. His previous CT was in 05/2022 and he did not start Lenvima until 07/2022, so it's hard to evaluate if he is responding to [facility] or not, the progression is likely the nature course of his [facility]  -Restaging CT scan from January 02, 2023 showed slightly improved previously treated liver lesion, and additional area of concern in the liver which are difficult to compare with previous scan due to different timing of contrast.  No other evidence of disease progression.  Plan to obtain MRI as next scan.  I personally reviewed the CT scan images and discussed the findings with patient -Will continue Lenvima, he is tolerating well overall. -Due to his weight being above 60 kg now, I increased his Lenvima to 12 mg daily in Feb 2024 -repeated liver MRI on 04/23/2023 showed stable treated liver lesion, L5 bone mets has slightly increased in size.  Patient developed worsening low back pain in July 2024, repeated lumbar MRI showed significant disease progression in the L5 bone lesion.  -I 06/27/2023 - will receive last radiation treatment to spinal lesion today  -I recommend changing systemic treatment to immunotherapy. I recommend him to proceed with durvalumab and Imjudo -initial immunotherapy treatment to start 06/30/2023   -the goal of therapy is curative/palliative to prolong life and prevent/improve cancer related symptoms. 06/25/2023 - CT Chest showing: 1. No acute findings within the chest. No suspicious findings for metastatic disease within the chest. 2. Unchanged 3 mm nodule within the medial right upper lobe. No new or suspicious lung nodules. 3. Suboptimal visualization of the known liver lesions, reflecting lack of IV contrast material. Similar appearance of ablation defect within the lateral right lobe of liver. 4. Left renal calculi. 5. Coronary artery calcifications. 6.  Aortic Atherosclerosis 06/25/2023 NM whole body bone scan showing: 1. Known L5 metastasis demonstrates low level uptake, mildly increased from previous bone scan. 2. No new lesions demonstrated. Labs reviewed  -CBC showing WBC 6.3; Hgb12.9; Hct 39.6; Plt 209; Anc 4.5 -CMP - K 3.9; glucose 120; BUN 31; Creatinine 1.27; eGFR > 60  Ca 9.8; LFTs normal.  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 Radiating to both hips and legs. Pain waking him up during the night. -completes palliative radiation ot bone lesion today.    Low back pain radiating to both legs Pain in lower back, hips, and legs worse over past few weeks. States that current medications for pain not really working.  -increased dose meloxicam to 15 mg daily. -may take 1 - 2 tablets oxycodone up to Q6 hours as needed for severe pain.  -recommended he take 2 capsules gabapentin at night to help with burning pain in lower legs which is most  severe at night and wakes him up. He understands not to take gabapentin and oxycodone at the same time.  He takes oxycodone only as needed and understands not to take this medication if he is going to be driving or going to work.    Plan: Review recent CT Chest Review NM  Whole Body Bone Scan Review labs  Final radiation treatment today  Chemotherapy education class today  Proceed with immunotherapy on 06/30/2023  Increase meloxicam dose to 15 mg daily Recommend he take 600 mg gabapentin at bedtime to help with severe neuropathy at night.  May take 1 to 2 tablets oxycodone 5 mg when needed for severe pain.  Reassess pain at next visit.   The patient understands the plans discussed today and is in agreement with them.  He knows to contact our office if he develops concerns prior to his next appointment.  I provided 30 minutes of face-to-face time during this encounter and > 50% was spent counseling as documented under my assessment and plan.    Carlean Jews, NP  [place] [facility] [address] [place] [address] Dept: [phone] Dept Fax: [phone]   No orders of the defined types were placed in this encounter.     CHIEF COMPLAINT:  CC: hepatocellular carcinoma   Current Treatment:  palliative radiation to bone lesion at L5.   INTERVAL HISTORY:  Corey Armstrong is here today for repeat clinical assessment. He denies fevers or chills. He denies pain. His appetite is good. His weight has been stable.  Hepatocellular Carcinoma 06/25/2023 - CT Chest showing: 1. No acute findings within the chest. No suspicious findings for metastatic disease within the chest. 2. Unchanged 3 mm nodule within the medial right upper lobe. No new or suspicious lung nodules. 3. Suboptimal visualization of the known liver lesions, reflecting lack of IV contrast material. Similar appearance of ablation defect within the lateral right lobe of liver. 4. Left renal calculi. 5. Coronary artery calcifications. 6.  Aortic Atherosclerosis 06/25/2023 NM whole body bone scan showing: 1. Known L5 metastasis demonstrates low level uptake, mildly increased from previous bone scan. 2. No new lesions  demonstrated. Labs reviewed  -CBC showing WBC 6.3; Hgb12.9; Hct 39.6; Plt 209; Anc 4.5 -CMP - K 3.9; glucose 120; BUN 31; Creatinine 1.27; eGFR > 60  Ca 9.8; LFTs normal.  06/27/2023 - will have chemo education class later today  Plan to proceed with immunotherapy on 06/30/2023  I have reviewed the past medical history, past surgical history, social history and family history with the patient and they are unchanged from previous note.  ALLERGIES:  has No Known Allergies.  MEDICATIONS:  Current Outpatient Medications  Medication Sig Dispense Refill   losartan-hydrochlorothiazide (HYZAAR) 100-25 MG tablet Take 0.5 tablets by mouth daily. 1 tablet 0   gabapentin (NEURONTIN) 300 MG capsule Take 2 capsules (600 mg total) by mouth at bedtime. 60 capsule 0   meloxicam (MOBIC) 15 MG tablet Take 1 tablet (15 mg total) by mouth daily. 30 tablet 0   oxyCODONE (OXY IR/ROXICODONE) 5 MG immediate release tablet Take 1-2 tablets (5-10 mg total) by mouth every 6 (six) hours as needed for up to 7 days for severe pain. 56 tablet 0   No current facility-administered medications for this visit.    HISTORY OF PRESENT ILLNESS:   Oncology History  Hepatocellular carcinoma (HCC)  12/08/2019 Imaging   Corey ABDOMEN WWO CONTRAST   IMPRESSION: Two  small masses in the right hepatic lobe which have characteristics diagnostic for hepatocellular carcinoma in the setting of cirrhosis. (LI-RADS Category 5: Definitely HCC)   No evidence of abdominal metastatic disease or other significant abnormality.   03/01/2020 Initial Diagnosis   Hepatocellular carcinoma (HCC)   03/01/2020 Procedure   CT GUIDE TISSUE ABLATION     IMPRESSION: Successful percutaneous thermal ablation.   Signed,   [name], MD, RPVI   06/02/2020 Imaging   Corey ABDOMEN WWO CONTRAST   IMPRESSION: 1. There has been interval ablation at two adjacent sites of the subcapsular right lobe of the liver, hepatic segment VI. There is  no residual contrast enhancement at these adjacent sites. LI-RADS category 5 TR, nonviable. 2. There are scattered, subcentimeter foci of arterial hyperenhancement of the anterior right lobe of the liver, for example adjacent to the ablation site measuring 3 mm and in the medial anterior right lobe of the liver measuring 6 mm. This larger focus is unchanged compared to prior. Others were not previously seen. These findings are nonspecific and consistent with LI-RADS category 3. Attention on follow-up.   09/02/2020 Imaging   Corey ABDOMEN WWO CONTRAST     IMPRESSION: 1. Post radiofrequency ablation along the margin of the RIGHT hemi liver. Heterogeneous appearance of this area on both T1 and T2. No sign of residual enhancement. LR TR nonviable. 2. Very subtle focus of restricted diffusion in the cephalad lateral segment LEFT hepatic lobe measuring 7 mm, corresponding to low signal on image 27 of series 21. Arterial enhancement is not seen in this area and this areas not seen on prior imaging. Not clear whether this represents a new lesion as this area is obscured largely by artifact on most sequences on today's study and on previous imaging evaluations. LR category 3, consider a 3 to six-month follow-up with abdominal MRI. 3. Lesions of concern scattered about the RIGHT hepatic lobe may represent perfusional anomalies as they were not seen on today's study. 4. Stable mild dilation of the biliary tree.   11/27/2020 Imaging   Corey ABDOMEN WWO CONTRAST   IMPRESSION: Minimal decrease in size of ablation defect in the peripheral anterior right hepatic lobe. No evidence of locally recurrent hepatocellular carcinoma or other suspicious liver lesions.   No evidence of metastatic disease or other acute findings.   04/03/2021 Imaging   Corey ABDOMEN WWO CONTRAST   IMPRESSION: Stable ablation defect in the lateral right hepatic lobe, without evidence of recurrent tumor at this site.   5 mm  hypovascular lesion in segment 5 of the right lobe was not definitely seen on previous study, but is too small to characterize. This shows no arterial phase hyperenhancement. Recommend continued follow-up by MRI in 6 months.   No evidence of abdominal metastatic disease.        09/03/2021 Imaging   Corey ABDOMEN WWO CONTRAST   IMPRESSION: 1. Side-by-side ablation sites in the right hepatic lobe in segment 6. The more anterior site remains largely cystic in appearance and no findings suspicious recurrent tumor in this area. There is however abnormal contrast enhancement around the larger more posterior and lateral ablation site worrisome for recurrent tumor. 2. Several small foci of early arterial phase enhancement in the liver. The peripheral lesions are likely vascular shunts. The more central lesions are likely dysplastic nodules. Recommend continued surveillance. 3. Stable bilateral renal cysts.   12/07/2021 Imaging   Corey ABDOMEN WWO CONTRAST  IMPRESSION: 1. Ablation defects in the right lobe of  the liver, similar to the prior study, without definitive evidence to suggest local recurrence of disease or metastatic disease. 2. Multiple tiny arterial phase areas of nodular hyperenhancement scattered throughout the liver, predominantly in the right lobe of the liver, without perceptible signal abnormality or diffusion restriction on other pulse sequences, nonspecific, and likely small benign areas of vascular shunting. Continued attention on follow-up imaging is recommended. 3. Multiple Bosniak class 1 and Bosniak class 2 cysts in the kidneys bilaterally, similar to the prior study, as above. 4. Aortic atherosclerosis.     06/01/2022 Imaging   Corey ABDOMEN WWO CONTRAST   IMPRESSION: 1. LR TR equivocal but suspicious findings with respect to previously treated disease in the RIGHT hepatic lobe given nodular enhancement adjacent and anterior superior to the ablation zone  as described. 2. Given lack of late arterial phase due to bolus timing a second area of abnormality within the liver is suspicious for additional site of hepatocellular carcinoma showing washout appearance and capsule appearance on current imaging, cardiac pulsation limiting assessment in the area of concern. 3. Suspect bony metastatic disease in the lower lumbar spine at L5 level. Dedicated spinal imaging may be helpful for further evaluation.     06/18/2022 Imaging   Corey Lumbar Spine W Wo Contrast   IMPRESSION: L5 vertebral body lesion is most consistent with a metastasis. There is probable trace ventral epidural extension   07/17/2022 Procedure   CT GUIDE TISSUE ABLATION   IMPRESSION: Successful CT-guided microwave ablation of recurrent hepatocellular carcinoma.   07/17/2022 Procedure   CT BONE TROCAR/NEEDLE BIOPSY SUPERFICIAL   IMPRESSION: Successful CT-guided core biopsy of L5 bone lesion.      07/17/2022 Pathology Results   SURGICAL PATHOLOGY  CASE: [id number]  PATIENT: Corey Armstrong  Surgical Pathology Report   SURGICAL PATHOLOGY  CASE: [id number]  PATIENT: Malachy Mood  Surgical Pathology Report      Clinical History: Hx of cirrhosis and small HCC with unexpected  suspicious lesion at L5. Rare HCC met to bone vs atypical hemangioma or  met from new primary. (crm)      FINAL MICROSCOPIC DIAGNOSIS:   A. 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 No acute findings within the chest. No suspicious findings for metastatic disease within the chest. 2. Unchanged 3 mm nodule within the medial right upper lobe. No new or suspicious lung nodules. 3. Suboptimal visualization of the known liver lesions, reflecting lack of IV contrast material. Similar appearance of ablation defect  within the lateral right lobe of liver. 4. Left renal calculi. 5. Coronary artery calcifications. 6.  Aortic Atherosclerosis   06/25/2023 Imaging   NM Bone Scan whole body  IMPRESSION: 1. Known L5 metastasis demonstrates low level uptake, mildly increased from previous bone scan. 2. No new lesions demonstrated.   07/02/2023 -  Chemotherapy   Patient is on Treatment Plan : Hepatocellular Carcinoma Tremelimumab-actl C1 D1 + Durvalumab q28d       Imaging         REVIEW OF SYSTEMS:   Constitutional: Denies fevers, chills or abnormal weight loss Eyes: Denies blurriness of vision Ears, nose, mouth, throat, and face: Denies mucositis or sore throat Respiratory: Denies cough, dyspnea or wheezes Cardiovascular: Denies palpitation, chest discomfort or lower extremity swelling Gastrointestinal:  Denies nausea, heartburn or change in bowel habits Skin: Denies abnormal skin  rashes Lymphatics: Denies new lymphadenopathy or easy bruising Neurological:Denies numbness, tingling or new weaknesses Behavioral/Psych: Mood is stable, no new changes  Musculoskeletal - worsening back pain which radiates to the hips and legs  All other systems were reviewed with the patient and are negative.   VITALS:   Today's Vitals   06/27/23 1240 06/27/23 1251  BP: (!) 159/76   Pulse: 83   Resp: 16   Temp: 97.7 F (36.5 C)   TempSrc: Temporal   SpO2: 100%   Weight: 133 lb 4.8 oz (60.5 kg)   PainSc:  9    Body mass index is 20.57 kg/m.   Wt Readings from Last 3 Encounters:  06/27/23 133 lb 4.8 oz (60.5 kg)  06/13/23 133 lb (60.3 kg)  06/10/23 132 lb 14.4 oz (60.3 kg)    Body mass index is 20.57 kg/m.  Performance status (ECOG): 1 - Symptomatic but completely ambulatory  PHYSICAL EXAM:   GENERAL:alert, no distress and comfortable SKIN: skin color, texture, turgor are normal, no rashes or significant lesions EYES: normal, Conjunctiva are pink and non-injected, sclera clear OROPHARYNX:no  exudate, no erythema and lips, buccal mucosa, and tongue normal  NECK: supple, thyroid normal size, non-tender, without nodularity LYMPH:  no palpable lymphadenopathy in the cervical, axillary or inguinal LUNGS: clear to auscultation and percussion with normal breathing effort HEART: regular rate & rhythm and no murmurs and no lower extremity edema ABDOMEN:abdomen soft, non-tender and normal bowel sounds Musculoskeletal:no cyanosis of digits and no clubbing  NEURO: alert & oriented x 3 with fluent speech, no focal motor/sensory deficits  LABORATORY DATA:  I have reviewed the data as listed    Component Value Date/Time   NA 142 06/27/2023 1210   K 3.9 06/27/2023 1210   CL 113 (H) 06/27/2023 1210   CO2 24 06/27/2023 1210   GLUCOSE 120 (H) 06/27/2023 1210   BUN 31 (H) 06/27/2023 1210   CREATININE 1.27 (H) 06/27/2023 1210   CREATININE 1.07 09/19/2021 1412   CALCIUM 9.8 06/27/2023 1210   PROT 7.9 06/27/2023 1210   ALBUMIN 4.3 06/27/2023 1210   AST 17 06/27/2023 1210   ALT 15 06/27/2023 1210   ALKPHOS 79 06/27/2023 1210   BILITOT 0.4 06/27/2023 1210   GFRNONAA >60 06/27/2023 1210   GFRNONAA 60 11/14/2020 0958   GFRAA 70 11/14/2020 0958    Lab Results  Component Value Date   WBC 6.3 06/27/2023   NEUTROABS 4.5 06/27/2023   HGB 12.9 (L) 06/27/2023   HCT 39.6 06/27/2023   MCV 100.5 (H) 06/27/2023   PLT 209 06/27/2023      RADIOGRAPHIC STUDIES: I have personally reviewed the radiological images as listed and agreed with the findings in the report. CT Chest Wo Contrast  Result Date: 06/27/2023 CLINICAL DATA:  Hepatocellular carcinoma with osseous metastasis. Staging. * Tracking Code: BO * EXAM: CT CHEST WITHOUT CONTRAST TECHNIQUE: Multidetector CT imaging of the chest was performed following the standard protocol without IV contrast. RADIATION DOSE REDUCTION: This exam was performed according to the departmental dose-optimization program which includes automated exposure control,  adjustment of the mA and/or kV according to patient size and/or use of iterative reconstruction technique. COMPARISON:  09/19/2022 FINDINGS: Cardiovascular: Normal heart size. Aortic atherosclerosis and coronary artery calcifications. No pericardial effusion identified. Mediastinum/Nodes: No enlarged mediastinal or axillary lymph nodes. Thyroid gland, trachea, and esophagus demonstrate no significant findings. Lungs/Pleura: No pleural effusion, airspace consolidation or atelectasis. Unchanged 3 mm nodule within the medial right upper lobe, image 46/8. No  new or suspicious lung nodules. Upper Abdomen: No acute abnormality. Stones identified within the upper pole of the left kidney. Suboptimal visualization of the known liver lesions, reflecting lack of IV contrast material. Ablation defect is identified within the lateral right hepatic lobe measuring 2.8 by 2.4 cm, image 145/2. Calcifications are again seen along the dome of the liver., Musculoskeletal: No aggressive lytic or sclerotic bone lesions. IMPRESSION: 1. No acute findings within the chest. No suspicious findings for metastatic disease within the chest. 2. Unchanged 3 mm nodule within the medial right upper lobe. No new or suspicious lung nodules. 3. Suboptimal visualization of the known liver lesions, reflecting lack of IV contrast material. Similar appearance of ablation defect within the lateral right lobe of liver. 4. Left renal calculi. 5. Coronary artery calcifications. 6.  Aortic Atherosclerosis (ICD10-I70.0). Electronically Signed   By: Signa Kell M.D.   On: 06/27/2023 10:31   NM Bone Scan Whole Body  Result Date: 06/27/2023 CLINICAL DATA:  Hepatocellular carcinoma with biopsy-proven L5 metastasis. EXAM: NUCLEAR MEDICINE WHOLE BODY BONE SCAN TECHNIQUE: Whole body anterior and posterior images were obtained approximately 3 hours after intravenous injection of radiopharmaceutical. RADIOPHARMACEUTICALS:  21.1 mCi Technetium-58m MDP IV  COMPARISON:  Whole-body bone scan 09/19/2022. CT of the chest 06/25/2023, CT abdomen and pelvis 01/02/2023 and lumbar MRI 06/07/2023. FINDINGS: There is subtle uptake within the known L5 metastasis, best seen on the anterior images. The uptake in the L5 vertebral body has mildly increased from the previous bone scan. No other osseous uptake suspicious for metastatic disease. Mild degenerative uptake in the shoulders. The soft tissue activity is normal. IMPRESSION: 1. Known L5 metastasis demonstrates low level uptake, mildly increased from previous bone scan. 2. No new lesions demonstrated. Electronically Signed   By: Carey Bullocks M.D.   On: 06/27/2023 10:26   Corey Lumbar Spine W Wo Contrast  Result Date: 06/07/2023 CLINICAL DATA:  67 year old male with hepatocellular carcinoma and L5 metastasis (biopsy proven 07/17/2022). Restaging. EXAM: MRI LUMBAR SPINE WITHOUT AND WITH CONTRAST TECHNIQUE: Multiplanar and multiecho pulse sequences of the lumbar spine were obtained without and with intravenous contrast. CONTRAST:  5mL GADAVIST GADOBUTROL 1 MMOL/ML IV SOLN COMPARISON:  CT Abdomen and Pelvis 01/02/2023. Lumbar MRI 06/18/2022. FINDINGS: Segmentation: Normal on the CT this year, the same numbering system used on the MRI last year. Alignment: Stable to mildly improved straightening of lumbar lordosis since last year. No significant scoliosis or spondylolisthesis. Vertebrae: Enlarged area of abnormal L5 vertebral body marrow signal and enhancement, now up to 31 mm AP (16 mm last year) and now with retropulsion/epidural extension of the tumor there (series 9, image 32) resulting in up to severe lateral recess stenosis at the descending L5 and S1 nerve levels, mild to moderate spinal stenosis (series 8, image 32). Pathologic fracture of the posteroinferior L5 body, but relatively maintained L5 vertebral body height. No tumor extension into the pedicles or posterior elements. Intrinsic increased marrow signal in some  of the visible sacrum and pelvis perhaps from prior radiation. Marrow signal remains within normal limits at the other lumbar levels. No new sites of marrow edema or enhancement are identified. Conus medullaris and cauda equina: Conus extends to the T12-L1 level. No lower spinal cord or conus signal abnormality. Cauda equina nerve roots remain normal. No abnormal intradural enhancement. No dural thickening or enhancement at this time. Paraspinal and other soft tissues: Simple appearing renal cysts (no follow-up imaging recommended). No prevertebral/retroperitoneal lymphadenopathy. Lumbar paraspinal soft tissues remain within normal limits.  Disc levels: Mild for age lumbar spine degeneration. No significant non malignant spinal stenosis. IMPRESSION: 1. Progressed L5 vertebral body metastasis since August is larger, with bulky retropulsion/epidural extension of tumor resulting in up to severe new lateral recess and moderate spinal stenosis at the descending L5 and S1 nerve levels. 2. No new lumbar metastatic disease identified. Electronically Signed   By: Odessa Fleming M.D.   On: 06/07/2023 09:58    Addendum I have seen the patient, examined him. I agree with the assessment and and plan and have edited the notes.   Corey Armstrong still has significant low back pain from his known L5 bone metastasis, he is finishing radiation treatment today.  We discussed pain management and dose adjustment for his pain medications.  I reviewed his restaging CT chest and bone scan with him, no other new metastasis.  Due to his disease progression, we will change his treatment to immunotherapy durvalumab and tremelimumab next week, benefit and potential side effects reviewed with him in detail, he agrees to proceed.  All questions were answered.  We will see him back in 2 weeks for follow-up and toxicity checkup.  I spent a total of 40 minutes for his visit today, more than 50% on face-to-face counseling.  Malachy Mood MD 06/27/2023

## 2023-06-27 NOTE — Assessment & Plan Note (Signed)
Bone metastasis at L 5 causing moderate to severe lower back pain. Radiating to both hips and legs. Pain waking him up during the night. -completes palliative radiation ot bone lesion today.

## 2023-06-28 ENCOUNTER — Other Ambulatory Visit: Payer: Self-pay

## 2023-06-28 LAB — T4: T4, Total: 7.6 ug/dL (ref 4.5–12.0)

## 2023-06-30 ENCOUNTER — Encounter: Payer: Self-pay | Admitting: Hematology

## 2023-06-30 ENCOUNTER — Inpatient Hospital Stay: Payer: Commercial Managed Care - PPO

## 2023-06-30 ENCOUNTER — Inpatient Hospital Stay: Payer: Commercial Managed Care - PPO | Admitting: Hematology

## 2023-06-30 ENCOUNTER — Other Ambulatory Visit: Payer: Self-pay

## 2023-06-30 VITALS — BP 192/66 | HR 70 | Temp 97.8°F | Resp 20 | Ht 71.0 in | Wt 136.8 lb

## 2023-06-30 DIAGNOSIS — C7951 Secondary malignant neoplasm of bone: Secondary | ICD-10-CM

## 2023-06-30 DIAGNOSIS — C220 Liver cell carcinoma: Secondary | ICD-10-CM

## 2023-06-30 DIAGNOSIS — Z5112 Encounter for antineoplastic immunotherapy: Secondary | ICD-10-CM | POA: Diagnosis not present

## 2023-06-30 MED ORDER — TREMELIMUMAB-ACTL CHEMO INJECTION 25 MG/1.25ML
300.0000 mg | Freq: Once | INTRAVENOUS | Status: AC
  Administered 2023-06-30: 300 mg via INTRAVENOUS
  Filled 2023-06-30: qty 15

## 2023-06-30 MED ORDER — SODIUM CHLORIDE 0.9 % IV SOLN
1500.0000 mg | Freq: Once | INTRAVENOUS | Status: AC
  Administered 2023-06-30: 1500 mg via INTRAVENOUS
  Filled 2023-06-30: qty 30

## 2023-06-30 MED ORDER — SODIUM CHLORIDE 0.9 % IV SOLN: Freq: Once | INTRAVENOUS | Status: AC

## 2023-06-30 NOTE — Radiation Completion Notes (Addendum)
  Radiation Oncology         [phone]) [phone] ________________________________  Name: Corey Armstrong Sr. MRN: [id number]  Date: 06/27/2023  DOB: 10-13-1956  Referring Physician: Tillman Abide, M.D. Date of Service: 2023-06-30 Radiation Oncologist: Margaretmary Bayley, M.D. 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   Intent: Curative   ==========DELIVERED PLANS==========  First Treatment Date: 2023-06-17 - Last Treatment Date: 2023-06-27   Plan Name: Spine_[id number] Site: Lumbar Spine Technique: SBRT/SRT-IMRT Mode: Photon Dose Per Fraction: 10 Gy Prescribed Dose (Delivered / Prescribed): 50 Gy / 50 Gy Prescribed Fxs (Delivered / Prescribed): 5 / 5     ==========ON TREATMENT VISIT DATES========== 2023-06-17, 2023-06-20, 2023-06-20, 2023-06-23, 2023-06-25, 2023-06-27    See weekly On Treatment Notes in Epic for details.  He tolerated the radiation treatments well with only modest fatigue.  The patient will receive a call in about one month from the radiation oncology department. He will continue follow up with his medical oncologist, Dr. Mosetta Putt, as well.  ------------------------------------------------   Margaretmary Dys, MD [facility]  Radiation Oncology Direct Dial: [phone]  Fax: [phone] [email or web address]  Skype  LinkedIn

## 2023-06-30 NOTE — Patient Instructions (Signed)
[facility]  Discharge Instructions: Thank you for choosing [facility] to provide your oncology and hematology care.   If you have a lab appointment with the Cancer Center, please go directly to the Cancer Center and check in at the registration area.   Wear comfortable clothing and clothing appropriate for easy access to any Portacath or PICC line.   We strive to give you quality time with your provider. You may need to reschedule your appointment if you arrive late (15 or more minutes).  Arriving late affects you and other patients whose appointments are after yours.  Also, if you miss three or more appointments without notifying the office, you may be dismissed from the clinic at the provider's discretion.      For prescription refill requests, have your pharmacy contact our office and allow 72 hours for refills to be completed.    Today you received the following chemotherapy and/or immunotherapy agents: Tremelimumab (Imjudo) and Durvalumab (Imfinzi)   To help prevent nausea and vomiting after your treatment, we encourage you to take your nausea medication as directed.  BELOW ARE SYMPTOMS THAT SHOULD BE REPORTED IMMEDIATELY: *FEVER GREATER THAN 100.4 F (38 C) OR HIGHER *CHILLS OR SWEATING *NAUSEA AND VOMITING THAT IS NOT CONTROLLED WITH YOUR NAUSEA MEDICATION *UNUSUAL SHORTNESS OF BREATH *UNUSUAL BRUISING OR BLEEDING *URINARY PROBLEMS (pain or burning when urinating, or frequent urination) *BOWEL PROBLEMS (unusual diarrhea, constipation, pain near the anus) TENDERNESS IN MOUTH AND THROAT WITH OR WITHOUT PRESENCE OF ULCERS (sore throat, sores in mouth, or a toothache) UNUSUAL RASH, SWELLING OR PAIN  UNUSUAL VAGINAL DISCHARGE OR ITCHING   Items with * indicate a potential emergency and should be followed up as soon as possible or go to the Emergency Department if any problems should occur.  Please show the CHEMOTHERAPY ALERT CARD or  IMMUNOTHERAPY ALERT CARD at check-in to the Emergency Department and triage nurse.  Should you have questions after your visit or need to cancel or reschedule your appointment, please contact [facility]  Dept: [phone]  and follow the prompts.  Office hours are 8:00 a.m. to 4:30 p.m. Monday - Friday. Please note that voicemails left after 4:00 p.m. may not be returned until the following business day.  We are closed weekends and major holidays. You have access to a nurse at all times for urgent questions. Please call the main number to the clinic Dept: [phone] and follow the prompts.   For any non-urgent questions, you may also contact your provider using MyChart. We now offer e-Visits for anyone 8 and older to request care online for non-urgent symptoms. For details visit [email or web address].   Also download the MyChart app! Go to the app store, search "MyChart", open the app, select [facility], and log in with your MyChart username and password. Tremelimumab Injection What is this medication? TREMELIMUMAB (tre mel IM ue mab) treats liver cancer and lung cancer. It works by helping your immune system slow or stop the spread of cancer cells. It is a monoclonal antibody. This medicine may be used for other purposes; ask your health care provider or pharmacist if you have questions. COMMON BRAND NAME(S): IMJUDO What should I tell my care team before I take this medication? They need to know if you have any of these conditions: Autoimmune conditions, such as Crohn disease, ulcerative colitis, lupus Nervous system conditions, such as Guillain-Barre syndrome or myasthenia gravis An unusual or  allergic reaction to tremelimumab, other medications, foods, dyes, or preservatives Pregnant or trying to get pregnant Breastfeeding How should I use this medication? This medication is infused into a vein. It is given by your care team in a hospital or clinic  setting. A special MedGuide will be given to you before each treatment. Be sure to read this information carefully each time. Talk to your care team about the use of this medication in children. Special care may be needed. Overdosage: If you think you have taken too much of this medicine contact a poison control center or emergency room at once. NOTE: This medicine is only for you. Do not share this medicine with others. What if I miss a dose? Keep appointments for follow-up doses. It is important not to miss your dose. Call your care team if you are unable to keep an appointment. What may interact with this medication? Interactions have not been studied. This list may not describe all possible interactions. Give your health care provider a list of all the medicines, herbs, non-prescription drugs, or dietary supplements you use. Also tell them if you smoke, drink alcohol, or use illegal drugs. Some items may interact with your medicine. What should I watch for while using this medication? Your condition will be monitored carefully while you are receiving this medication. You may need blood work done while you are taking this medication. This medication may cause serious skin reactions. They can happen weeks to months after starting the medication. Contact your care team right away if you notice fevers or flu-like symptoms with a rash. The rash may be red or purple and then turn into blisters or peeling of the skin. You may also notice a red rash with swelling of the face, lips, or lymph nodes in your neck or under your arms. Tell your care team right away if you have any change in your eyesight. Talk to your care team if you may be pregnant. Serious birth defects can occur if you take this medication during pregnancy and for 3 months after the last dose. You will need a negative pregnancy test before starting this medication. Contraception is recommended while taking this medication and for 3 months  after the last dose. Your care team can help you find the option that works for you. Do not breastfeed while taking this medication and for 3 months after the last dose. What side effects may I notice from receiving this medication? Side effects that you should report to your care team as soon as possible: Allergic reactions--skin rash, itching, hives, swelling of the face, lips, tongue, or throat Dry cough, shortness of breath or trouble breathing Eye pain, redness, irritation, or discharge with blurry or decreased vision Heart muscle inflammation--unusual weakness or fatigue, shortness of breath, chest pain, fast or irregular heartbeat, dizziness, swelling of the ankles, feet, or hands Hormone gland problems--headache, sensitivity to light, unusual weakness or fatigue, dizziness, fast or irregular heartbeat, increased sensitivity to cold or heat, excessive sweating, constipation, hair loss, increased thirst or amount of urine, tremors or shaking, irritability Infusion reactions--chest pain, shortness of breath or trouble breathing, feeling faint or lightheaded Kidney injury (glomerulonephritis)--decrease in the amount of urine, red or dark brown urine, foamy or bubbly urine, swelling of the ankles, hands, or feet Liver injury--right upper belly pain, loss of appetite, nausea, light-colored stool, dark yellow or brown urine, yellowing skin or eyes, unusual weakness or fatigue Pain, tingling, or numbness in the hands or feet, muscle weakness, change in  vision, confusion or trouble speaking, loss of balance or coordination, trouble walking, seizures Pancreatitis--severe stomach pain that spreads to your back or gets worse after eating or when touched, fever, nausea, vomiting Rash, fever, and swollen lymph nodes Redness, blistering, peeling, or loosening of the skin, including inside the mouth Stomach pain that is severe, does not go away, or gets worse Sudden or severe stomach pain, bloody diarrhea,  fever, nausea, vomiting Side effects that usually do not require medical attention (report these to your care team if they continue or are bothersome): Bone, joint, or muscle pain Diarrhea Fatigue Loss of appetite Nausea Skin rash This list may not describe all possible side effects. Call your doctor for medical advice about side effects. You may report side effects to FDA at 1-800-FDA-1088. Where should I keep my medication? This medication is given in a hospital or clinic. It will not be stored at home. NOTE: This sheet is a summary. It may not cover all possible information. If you have questions about this medicine, talk to your doctor, pharmacist, or health care provider.  2024 Elsevier/Gold Standard (2022-12-16 00:00:00) Durvalumab Injection What is this medication? DURVALUMAB (dur VAL ue mab) treats some types of cancer. It works by helping your immune system slow or stop the spread of cancer cells. It is a monoclonal antibody. This medicine may be used for other purposes; ask your health care provider or pharmacist if you have questions. COMMON BRAND NAME(S): IMFINZI What should I tell my care team before I take this medication? They need to know if you have any of these conditions: Allogeneic stem cell transplant (uses someone else's stem cells) Autoimmune diseases, such as Crohn disease, ulcerative colitis, lupus History of chest radiation Nervous system problems, such as Guillain-Barre syndrome, myasthenia gravis Organ transplant An unusual or allergic reaction to durvalumab, other medications, foods, dyes, or preservatives Pregnant or trying to get pregnant Breast-feeding How should I use this medication? This medication is infused into a vein. It is given by your care team in a hospital or clinic setting. A special MedGuide will be given to you before each treatment. Be sure to read this information carefully each time. Talk to your care team about the use of this  medication in children. Special care may be needed. Overdosage: If you think you have taken too much of this medicine contact a poison control center or emergency room at once. NOTE: This medicine is only for you. Do not share this medicine with others. What if I miss a dose? Keep appointments for follow-up doses. It is important not to miss your dose. Call your care team if you are unable to keep an appointment. What may interact with this medication? Interactions have not been studied. This list may not describe all possible interactions. Give your health care provider a list of all the medicines, herbs, non-prescription drugs, or dietary supplements you use. Also tell them if you smoke, drink alcohol, or use illegal drugs. Some items may interact with your medicine. What should I watch for while using this medication? Your condition will be monitored carefully while you are receiving this medication. You may need blood work while taking this medication. This medication may cause serious skin reactions. They can happen weeks to months after starting the medication. Contact your care team right away if you notice fevers or flu-like symptoms with a rash. The rash may be red or purple and then turn into blisters or peeling of the skin. You may also notice a  red rash with swelling of the face, lips, or lymph nodes in your neck or under your arms. Tell your care team right away if you have any change in your eyesight. Talk to your care team if you may be pregnant. Serious birth defects can occur if you take this medication during pregnancy and for 3 months after the last dose. You will need a negative pregnancy test before starting this medication. Contraception is recommended while taking this medication and for 3 months after the last dose. Your care team can help you find the option that works for you. Do not breastfeed while taking this medication and for 3 months after the last dose. What side  effects may I notice from receiving this medication? Side effects that you should report to your care team as soon as possible: Allergic reactions--skin rash, itching, hives, swelling of the face, lips, tongue, or throat Dry cough, shortness of breath or trouble breathing Eye pain, redness, irritation, or discharge with blurry or decreased vision Heart muscle inflammation--unusual weakness or fatigue, shortness of breath, chest pain, fast or irregular heartbeat, dizziness, swelling of the ankles, feet, or hands Hormone gland problems--headache, sensitivity to light, unusual weakness or fatigue, dizziness, fast or irregular heartbeat, increased sensitivity to cold or heat, excessive sweating, constipation, hair loss, increased thirst or amount of urine, tremors or shaking, irritability Infusion reactions--chest pain, shortness of breath or trouble breathing, feeling faint or lightheaded Kidney injury (glomerulonephritis)--decrease in the amount of urine, red or dark brown urine, foamy or bubbly urine, swelling of the ankles, hands, or feet Liver injury--right upper belly pain, loss of appetite, nausea, light-colored stool, dark yellow or brown urine, yellowing skin or eyes, unusual weakness or fatigue Pain, tingling, or numbness in the hands or feet, muscle weakness, change in vision, confusion or trouble speaking, loss of balance or coordination, trouble walking, seizures Rash, fever, and swollen lymph nodes Redness, blistering, peeling, or loosening of the skin, including inside the mouth Sudden or severe stomach pain, bloody diarrhea, fever, nausea, vomiting Side effects that usually do not require medical attention (report these to your care team if they continue or are bothersome): Bone, joint, or muscle pain Diarrhea Fatigue Loss of appetite Nausea Skin rash This list may not describe all possible side effects. Call your doctor for medical advice about side effects. You may report side  effects to FDA at 1-800-FDA-1088. Where should I keep my medication? This medication is given in a hospital or clinic. It will not be stored at home. NOTE: This sheet is a summary. It may not cover all possible information. If you have questions about this medicine, talk to your doctor, pharmacist, or health care provider.  2024 Elsevier/Gold Standard (2022-03-12 00:00:00)

## 2023-06-30 NOTE — Progress Notes (Signed)
Blood pressure elevated pt. denies complaints of chest pain, dizziness, no shortness of breath noted. Per Dr. Arbutus Ped ok to discharge pt. instructed to monitor blood pressure at home. Pt. states he is due for his blood pressure medication at 1800 and will monitor. Instructed to call for any issues or concerns.

## 2023-07-01 ENCOUNTER — Ambulatory Visit: Payer: Commercial Managed Care - PPO | Admitting: Radiation Oncology

## 2023-07-01 ENCOUNTER — Encounter: Payer: Self-pay | Admitting: Hematology

## 2023-07-01 ENCOUNTER — Other Ambulatory Visit: Payer: Self-pay | Admitting: Hematology

## 2023-07-01 DIAGNOSIS — M79605 Pain in left leg: Secondary | ICD-10-CM

## 2023-07-01 DIAGNOSIS — C7951 Secondary malignant neoplasm of bone: Secondary | ICD-10-CM

## 2023-07-01 MED ORDER — GABAPENTIN 300 MG PO CAPS
ORAL_CAPSULE | ORAL | 0 refills | Status: DC
Start: 2023-07-01 — End: 2023-07-15

## 2023-07-02 ENCOUNTER — Ambulatory Visit: Payer: Commercial Managed Care - PPO

## 2023-07-02 ENCOUNTER — Ambulatory Visit: Payer: Commercial Managed Care - PPO | Admitting: Radiation Oncology

## 2023-07-02 ENCOUNTER — Other Ambulatory Visit: Payer: Commercial Managed Care - PPO

## 2023-07-02 ENCOUNTER — Other Ambulatory Visit: Payer: Self-pay | Admitting: Nurse Practitioner

## 2023-07-02 DIAGNOSIS — C7951 Secondary malignant neoplasm of bone: Secondary | ICD-10-CM

## 2023-07-02 DIAGNOSIS — M79605 Pain in left leg: Secondary | ICD-10-CM

## 2023-07-02 MED ORDER — OXYCODONE HCL 5 MG PO TABS
5.0000 mg | ORAL_TABLET | ORAL | 0 refills | Status: AC | PRN
Start: 2023-07-04 — End: 2023-07-11

## 2023-07-03 ENCOUNTER — Ambulatory Visit: Payer: Commercial Managed Care - PPO

## 2023-07-04 ENCOUNTER — Ambulatory Visit: Payer: Commercial Managed Care - PPO | Admitting: Radiation Oncology

## 2023-07-07 ENCOUNTER — Ambulatory Visit: Payer: Commercial Managed Care - PPO

## 2023-07-09 ENCOUNTER — Ambulatory Visit: Payer: Commercial Managed Care - PPO

## 2023-07-09 ENCOUNTER — Encounter: Payer: Self-pay | Admitting: Nurse Practitioner

## 2023-07-09 ENCOUNTER — Inpatient Hospital Stay: Payer: Commercial Managed Care - PPO

## 2023-07-09 ENCOUNTER — Inpatient Hospital Stay (HOSPITAL_BASED_OUTPATIENT_CLINIC_OR_DEPARTMENT_OTHER): Payer: Commercial Managed Care - PPO | Admitting: Hematology

## 2023-07-09 ENCOUNTER — Inpatient Hospital Stay (HOSPITAL_BASED_OUTPATIENT_CLINIC_OR_DEPARTMENT_OTHER): Payer: Commercial Managed Care - PPO | Admitting: Nurse Practitioner

## 2023-07-09 VITALS — BP 154/61 | HR 78 | Temp 97.9°F | Resp 16 | Ht 71.0 in | Wt 135.6 lb

## 2023-07-09 DIAGNOSIS — Z5112 Encounter for antineoplastic immunotherapy: Secondary | ICD-10-CM | POA: Diagnosis not present

## 2023-07-09 DIAGNOSIS — C220 Liver cell carcinoma: Secondary | ICD-10-CM

## 2023-07-09 DIAGNOSIS — G893 Neoplasm related pain (acute) (chronic): Secondary | ICD-10-CM | POA: Diagnosis not present

## 2023-07-09 DIAGNOSIS — Z515 Encounter for palliative care: Secondary | ICD-10-CM

## 2023-07-09 DIAGNOSIS — C7951 Secondary malignant neoplasm of bone: Secondary | ICD-10-CM

## 2023-07-09 DIAGNOSIS — M79605 Pain in left leg: Secondary | ICD-10-CM

## 2023-07-09 DIAGNOSIS — M545 Low back pain, unspecified: Secondary | ICD-10-CM

## 2023-07-09 DIAGNOSIS — M792 Neuralgia and neuritis, unspecified: Secondary | ICD-10-CM | POA: Diagnosis not present

## 2023-07-09 DIAGNOSIS — M79604 Pain in right leg: Secondary | ICD-10-CM | POA: Diagnosis not present

## 2023-07-09 DIAGNOSIS — Z7189 Other specified counseling: Secondary | ICD-10-CM

## 2023-07-09 DIAGNOSIS — R530 Neoplastic (malignant) related fatigue: Secondary | ICD-10-CM

## 2023-07-09 LAB — CMP (CANCER CENTER ONLY)
ALT: 14 U/L (ref 0–44)
AST: 17 U/L (ref 15–41)
Albumin: 4.1 g/dL (ref 3.5–5.0)
Alkaline Phosphatase: 67 U/L (ref 38–126)
Anion gap: 4 — ABNORMAL LOW (ref 5–15)
BUN: 28 mg/dL — ABNORMAL HIGH (ref 8–23)
CO2: 27 mmol/L (ref 22–32)
Calcium: 9.9 mg/dL (ref 8.9–10.3)
Chloride: 111 mmol/L (ref 98–111)
Creatinine: 1.21 mg/dL (ref 0.61–1.24)
GFR, Estimated: >60 mL/min (ref >60)
Glucose, Bld: 95 mg/dL (ref 70–99)
Potassium: 3.9 mmol/L (ref 3.5–5.1)
Sodium: 142 mmol/L (ref 135–145)
Total Bilirubin: 0.3 mg/dL (ref 0.3–1.2)
Total Protein: 7.2 g/dL (ref 6.5–8.1)

## 2023-07-09 LAB — CBC WITH DIFFERENTIAL (CANCER CENTER ONLY)
Abs Immature Granulocytes: 0.02 10*3/uL (ref 0.00–0.07)
Basophils Absolute: 0 10*3/uL (ref 0.0–0.1)
Basophils Relative: 1 %
Eosinophils Absolute: 0.2 10*3/uL (ref 0.0–0.5)
Eosinophils Relative: 4 %
HCT: 35.2 % — ABNORMAL LOW (ref 39.0–52.0)
Hemoglobin: 11.8 g/dL — ABNORMAL LOW (ref 13.0–17.0)
Immature Granulocytes: 0 %
Lymphocytes Relative: 18 %
Lymphs Abs: 0.8 10*3/uL (ref 0.7–4.0)
MCH: 33.1 pg (ref 26.0–34.0)
MCHC: 33.5 g/dL (ref 30.0–36.0)
MCV: 98.9 fL (ref 80.0–100.0)
Monocytes Absolute: 0.6 10*3/uL (ref 0.1–1.0)
Monocytes Relative: 14 %
Neutro Abs: 2.9 10*3/uL (ref 1.7–7.7)
Neutrophils Relative %: 63 %
Platelet Count: 134 10*3/uL — ABNORMAL LOW (ref 150–400)
RBC: 3.56 MIL/uL — ABNORMAL LOW (ref 4.22–5.81)
RDW: 13.8 % (ref 11.5–15.5)
WBC Count: 4.6 10*3/uL (ref 4.0–10.5)
nRBC: 0 % (ref 0.0–0.2)

## 2023-07-09 NOTE — Progress Notes (Signed)
[facility]   Telephone:([phone] Fax:([phone]) [phone]   Clinic Follow up Note   Patient Care Team: [name], MD as PCP - General (Internal Medicine) Malachy Mood, MD as Attending Physician (Hematology and Oncology)  Date of Service:  07/09/2023  CHIEF COMPLAINT: f/u of [facility]  CURRENT THERAPY:  Second line Durvalumab every 4 weeks, Imjudo on C1D1 only, started on 06/30/23   ASSESSMENT:  Corey Quintiliani Sr. is a 67 y.o. male with   Hepatocellular carcinoma (HCC) diagnosed 11/2019 by MRI for f/u of cirrhosis. S/p microwave ablation on 03/01/20. -MRI 06/01/22 showed suspicion for local recurrence and metastasis at L5. Biopsy of L5 confirmed metastatic HCC. S/p repeat microwave ablation on 07/17/22. -started lenvima 08/16/22 -staging CT CAP and bone scan on 09/19/22 showed: interval enlargement of right hepatic lobe HCC; new retropulsed bony fragment at L5; no other new lesions. His previous CT was in 05/2022 and he did not start Lenvima until 07/2022, so it's hard to evaluate if he is responding to [facility] or not, the progression is likely the nature course of his [facility]  -Restaging CT scan from January 02, 2023 showed slightly improved previously treated liver lesion, and additional area of concern in the liver which are difficult to compare with previous scan due to different timing of contrast.  No other evidence of disease progression.  Plan to obtain MRI as next scan.  I personally reviewed the CT scan images and discussed the findings with patient -Will continue Lenvima, he is tolerating well overall. -Due to his weight being above 60 kg now, I increased his Lenvima to 12 mg daily in Feb 2024 -repeated liver MRI on 04/23/2023 showed stable treated liver lesion, L5 bone mets has slightly increased in size.  Patient developed worsening low back pain in July 2024, repeated lumbar MRI showed significant disease progression in the L5 bone lesion.  I personally reviewed the  scan images and discussed the findings with patient. -I recommend palliative radiation to his L5 lesion for pain control -I recommend changing systemic treatment to immunotherapy, I discussed option of atezolizumab and bevacizumab, durvalumab and Imjudo, navel and ipi etc. since he previously progressed on oral TKI, I recommend him to proceed with durvalumab and Imjudo. He started on 06/30/23 -the goal of therapy is curative/palliative to prolong life and prevent/improve cancer related symptoms.   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 (LI-RADS Category 5: Definitely HCC)   No evidence of abdominal metastatic disease or other significant abnormality.   03/01/2020 Initial Diagnosis   Hepatocellular carcinoma (HCC)   03/01/2020 Procedure   CT GUIDE TISSUE ABLATION     IMPRESSION: Successful percutaneous thermal ablation.   Signed,   [name], MD, RPVI   06/02/2020 Imaging   MR ABDOMEN WWO CONTRAST   IMPRESSION: 1. There has been interval ablation at two adjacent sites of the subcapsular right lobe of the liver, hepatic segment VI. There is no residual contrast enhancement at these adjacent sites. LI-RADS  category 5 TR, nonviable. 2. There are scattered, subcentimeter foci of arterial hyperenhancement of the anterior right lobe of the liver, for example adjacent to the ablation site measuring 3 mm and in the medial anterior right lobe of the  liver measuring 6 mm. This larger focus is unchanged compared to prior. Others were not previously seen. These findings are nonspecific and consistent with LI-RADS category 3. Attention on follow-up.   09/02/2020 Imaging   MR ABDOMEN WWO CONTRAST     IMPRESSION: 1. Post radiofrequency ablation along the margin of the RIGHT hemi liver. Heterogeneous appearance of this area on both T1 and T2. No sign of residual enhancement. LR TR nonviable. 2. Very subtle focus of restricted diffusion in the cephalad lateral segment LEFT hepatic lobe measuring 7 mm, corresponding to low signal on image 27 of series 21. Arterial enhancement is not seen in this area and this areas not seen on prior imaging. Not clear whether this represents a new lesion as this area is obscured largely by artifact on most sequences on today's study and on previous imaging evaluations. LR category 3, consider a 3 to six-month follow-up with abdominal MRI. 3. Lesions of concern scattered about the RIGHT hepatic lobe may represent perfusional anomalies as they were not seen on today's study. 4. Stable mild dilation of the biliary tree.   11/27/2020 Imaging   MR ABDOMEN WWO CONTRAST   IMPRESSION: Minimal decrease in size of ablation defect in the peripheral anterior right hepatic lobe. No evidence of locally recurrent hepatocellular carcinoma or other suspicious liver lesions.   No evidence of metastatic disease or other acute findings.   04/03/2021 Imaging   MR ABDOMEN WWO CONTRAST   IMPRESSION: Stable ablation defect in the lateral right hepatic lobe, without evidence of recurrent tumor at this site.   5 mm hypovascular lesion in segment 5 of the right lobe was not definitely seen on previous study, but is too small to characterize. This shows no arterial phase hyperenhancement. Recommend continued follow-up by MRI in 6 months.   No evidence of abdominal metastatic disease.        09/03/2021 Imaging    MR ABDOMEN WWO CONTRAST   IMPRESSION: 1. Side-by-side ablation sites in the right hepatic lobe in segment 6. The more anterior site remains largely cystic in appearance and no findings suspicious recurrent tumor in this area. There is however abnormal contrast enhancement around the larger more posterior and lateral ablation site worrisome for recurrent tumor. 2. Several small foci of early arterial phase enhancement in the liver. The peripheral lesions are likely vascular shunts. The more central lesions are likely dysplastic nodules. Recommend continued surveillance. 3. Stable bilateral renal cysts.   12/07/2021 Imaging   MR ABDOMEN WWO CONTRAST  IMPRESSION: 1. Ablation defects in the right lobe of the liver, similar to the prior study, without definitive evidence to suggest local recurrence of disease or metastatic disease. 2. Multiple tiny arterial phase areas of nodular hyperenhancement scattered throughout the liver, predominantly in the right lobe of the liver, without perceptible signal abnormality or diffusion restriction on other pulse sequences, nonspecific, and likely small benign areas of vascular shunting. Continued attention on follow-up imaging is recommended. 3. Multiple Bosniak class 1 and Bosniak class 2 cysts in the kidneys bilaterally, similar to the prior study, as above. 4. Aortic atherosclerosis.     06/01/2022 Imaging   MR ABDOMEN WWO CONTRAST   IMPRESSION: 1. LR TR equivocal but suspicious findings with respect to previously treated disease in  the RIGHT hepatic lobe given nodular enhancement adjacent and anterior superior to the ablation zone as described. 2. Given lack of late arterial phase due to bolus timing a second area of abnormality within the liver is suspicious for additional site of hepatocellular carcinoma showing washout appearance and capsule appearance on current imaging, cardiac pulsation limiting assessment in the area of  concern. 3. Suspect bony metastatic disease in the lower lumbar spine at L5 level. Dedicated spinal imaging may be helpful for further evaluation.     06/18/2022 Imaging   MR Lumbar Spine W Wo Contrast   IMPRESSION: L5 vertebral body lesion is most consistent with a metastasis. There is probable trace ventral epidural extension   07/17/2022 Procedure   CT GUIDE TISSUE ABLATION   IMPRESSION: Successful CT-guided microwave ablation of recurrent hepatocellular carcinoma.   07/17/2022 Procedure   CT BONE TROCAR/NEEDLE BIOPSY SUPERFICIAL   IMPRESSION: Successful CT-guided core biopsy of L5 bone lesion.      07/17/2022 Pathology Results   SURGICAL PATHOLOGY  CASE: [id number]  PATIENT: SANJAYA PHILEMON  Surgical Pathology Report   SURGICAL PATHOLOGY  CASE: [id number]  PATIENT: Malachy Mood  Surgical Pathology Report      Clinical History: Hx of cirrhosis and small HCC with unexpected  suspicious lesion at L5. Rare HCC met to bone vs atypical hemangioma or  met from new primary. (crm)      FINAL MICROSCOPIC DIAGNOSIS:   A. 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 No acute findings within the chest. No suspicious findings for metastatic disease within the chest. 2. Unchanged 3 mm nodule within the medial right upper lobe. No new or suspicious lung nodules. 3. Suboptimal visualization of the known liver lesions, reflecting lack of IV contrast material. Similar appearance of ablation defect within the lateral right lobe of liver. 4. Left renal calculi. 5. Coronary artery calcifications. 6.  Aortic Atherosclerosis   06/25/2023 Imaging   NM Bone Scan whole body  IMPRESSION: 1. Known L5 metastasis demonstrates low level uptake, mildly increased from previous bone scan. 2. No new lesions  demonstrated.   06/30/2023 -  Chemotherapy   Patient is on Treatment Plan : Hepatocellular Carcinoma Tremelimumab-actl C1 D1 + Durvalumab q28d       Imaging        INTERVAL HISTORY:  Kathi Der Sr. is here for a follow up of HCC. He was last seen by me on 06/27/2023. He presents to the clinic alone.  Pain is better overall, on oxycodone 2 a day, and garbapentin  He tolerated first infusion well last week, mild fatigue, no other side effects    All other systems were reviewed with the patient and are negative.  MEDICAL HISTORY:  Past Medical History:  Diagnosis Date   Headache    migraines   Hepatitis    HX OF hEP c ?    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    SURGICAL HISTORY: Past Surgical History:  Procedure Laterality Date   IR RADIOLOGIST EVAL & MGMT  01/25/2020   IR RADIOLOGIST EVAL & MGMT  03/21/2020   IR RADIOLOGIST EVAL & MGMT  06/07/2020   IR RADIOLOGIST EVAL & MGMT  09/05/2020   IR RADIOLOGIST EVAL & MGMT  12/05/2020   IR RADIOLOGIST EVAL & MGMT  05/09/2021   IR RADIOLOGIST EVAL & MGMT  09/06/2021   IR RADIOLOGIST EVAL & MGMT  12/12/2021   IR RADIOLOGIST EVAL & MGMT  06/25/2022   IR RADIOLOGIST EVAL & MGMT  07/24/2022   RADIOLOGY WITH ANESTHESIA N/A 03/01/2020   Procedure: CT WITH ANESTHESIA  THERMAL MICROWAVE  ABLATIION;  Surgeon: [name], MD;  Location: [facility];  Service: Anesthesiology;  Laterality: N/A;   RADIOLOGY WITH ANESTHESIA N/A 07/17/2022   Procedure: CT MICROWAVE ABLATION;  Surgeon: [name], MD;  Location: [facility];  Service: Radiology;  Laterality: N/A;    I have reviewed the social history and family history with the patient and they are unchanged from previous note.  ALLERGIES:  has No Known Allergies.  MEDICATIONS:  Current Outpatient Medications  Medication Sig Dispense Refill   gabapentin (NEURONTIN) 300 MG capsule Take 1 cap in morning and lunch, 2 cap at bed time 120 capsule 0    losartan-hydrochlorothiazide (HYZAAR) 100-25 MG tablet Take 0.5 tablets by mouth daily. 1 tablet 0   meloxicam (MOBIC) 15 MG tablet Take 1 tablet (15 mg total) by mouth daily. 30 tablet 0   oxyCODONE (OXY IR/ROXICODONE) 5 MG immediate release tablet Take 1-2 tablets (5-10 mg total) by mouth every 4 (four) hours as needed for up to 7 days for severe pain. 75 tablet 0   No current facility-administered medications for this visit.    PHYSICAL EXAMINATION: ECOG PERFORMANCE STATUS: 1 - Symptomatic but completely ambulatory  Vitals:   07/09/23 1036  BP: (!) 154/61  Pulse: 78  Resp: 16  Temp: 97.9 F (36.6 C)  SpO2: 100%   Wt Readings from Last 3 Encounters:  07/09/23 135 lb 9.6 oz (61.5 kg)  06/30/23 136 lb 12 oz (62 kg)  06/27/23 133 lb 4.8 oz (60.5 kg)     GENERAL:alert, no distress and comfortable SKIN: skin color, texture, turgor are normal, (+) diffuse skin rash in arms and neck  EYES: normal, Conjunctiva are pink and non-injected, sclera clear Musculoskeletal:no cyanosis of digits and no clubbing  NEURO: alert & oriented x 3 with fluent speech, no focal motor/sensory deficits  LABORATORY DATA:  I have reviewed the data as listed    Latest Ref Rng & Units 07/09/2023   10:20 AM 06/27/2023   12:10 PM 06/10/2023    1:03 PM  CBC  WBC 4.0 - 10.5 K/uL 4.6  6.3  6.9   Hemoglobin 13.0 - 17.0 g/dL 96.0  45.4  09.8   Hematocrit 39.0 - 52.0 % 35.2  39.6  41.6   Platelets 150 - 400 K/uL 134  209  195         Latest Ref Rng & Units 06/27/2023   12:10 PM 06/10/2023    1:03 PM 04/29/2023    8:39 AM  CMP  Glucose 70 - 99 mg/dL 119  147  829   BUN 8 - 23 mg/dL 31  24  25    Creatinine 0.61 - 1.24 mg/dL 5.62  1.30  8.65   Sodium 135 - 145 mmol/L 142  139  140   Potassium 3.5 - 5.1 mmol/L 3.9  3.7  4.3   Chloride 98 - 111 mmol/L 113  106  109   CO2 22 - 32 mmol/L 24  26  25    Calcium 8.9 - 10.3 mg/dL 9.8  78.4  9.8   Total Protein 6.5 - 8.1 g/dL 7.9  7.6  7.3   Total Bilirubin  0.3 - 1.2 mg/dL  0.4  0.4  0.4   Alkaline Phos 38 - 126 U/L 79  65  68   AST 15 - 41 U/L 17  15  18    ALT 0 - 44 U/L 15  10  12        RADIOGRAPHIC STUDIES: I have personally reviewed the radiological images as listed and agreed with the findings in the report. No results found.    No orders of the defined types were placed in this encounter.  All questions were answered. The patient knows to call the clinic with any problems, questions or concerns. No barriers to learning was detected. The total time spent in the appointment was 20 minutes.     Malachy Mood, MD 07/09/2023

## 2023-07-09 NOTE — Assessment & Plan Note (Signed)
diagnosed 11/2019 by MRI for f/u of cirrhosis. S/p microwave ablation on 03/01/20. -MRI 06/01/22 showed suspicion for local recurrence and metastasis at L5. Biopsy of L5 confirmed metastatic HCC. S/p repeat microwave ablation on 07/17/22. -started lenvima 08/16/22 -staging CT CAP and bone scan on 09/19/22 showed: interval enlargement of right hepatic lobe HCC; new retropulsed bony fragment at L5; no other new lesions. His previous CT was in 05/2022 and he did not start Lenvima until 07/2022, so it's hard to evaluate if he is responding to [facility] or not, the progression is likely the nature course of his [facility]  -Restaging CT scan from January 02, 2023 showed slightly improved previously treated liver lesion, and additional area of concern in the liver which are difficult to compare with previous scan due to different timing of contrast.  No other evidence of disease progression.  Plan to obtain MRI as next scan.  I personally reviewed the CT scan images and discussed the findings with patient -Will continue Lenvima, he is tolerating well overall. -Due to his weight being above 60 kg now, I increased his Lenvima to 12 mg daily in Feb 2024 -repeated liver MRI on 04/23/2023 showed stable treated liver lesion, L5 bone mets has slightly increased in size.  Patient developed worsening low back pain in July 2024, repeated lumbar MRI showed significant disease progression in the L5 bone lesion.  I personally reviewed the scan images and discussed the findings with patient. -I recommend palliative radiation to his L5 lesion for pain control -I recommend changing systemic treatment to immunotherapy, I discussed option of atezolizumab and bevacizumab, durvalumab and Imjudo, navel and ipi etc. since he previously progressed on oral TKI, I recommend him to proceed with durvalumab and Imjudo. He started on 06/30/23 -the goal of therapy is curative/palliative to prolong life and prevent/improve cancer related symptoms.

## 2023-07-09 NOTE — Assessment & Plan Note (Signed)
-  received palliative RT to L5 bone met 8/6-8/16/2024  -continue pain management

## 2023-07-09 NOTE — Assessment & Plan Note (Addendum)
-  On oxycodone, gabapentin and meloxicam

## 2023-07-09 NOTE — Patient Instructions (Addendum)
-   take the gabapentin and oxycodone an hour apart to help with drowsiness and nausea - increase your gabapentin to 2 caps in the morning, 2 caps in the afternoon, and 2 caps in the evening - take Mirilax every morning to help with constipation you should no go more than 2 days without a bowel movement - call the office with any questions or concerns at [phone] - call us 2-3 days before you're out of your medication to allow time for your pharmacy to order your medications

## 2023-07-09 NOTE — Progress Notes (Unsigned)
Palliative Medicine [facility]  Telephone:(336) [phone] Fax:(336) [phone]   Name: Corey Famiglietti Sr. Date: 07/09/2023 MRN: [id number]  DOB: 1956/03/04  Patient Care Team: [name], MD as PCP - General (Internal Medicine) Malachy Mood, MD as Attending Physician (Hematology and Oncology)    REASON FOR CONSULTATION: Corey Vanwie Sr. is a 67 y.o. 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  Palliative ask to see for symptom and pain management and goals of care.    SOCIAL HISTORY:     reports that he has quit smoking. He has been exposed to tobacco smoke. He has never used smokeless tobacco. He reports that he does not currently use alcohol. He reports that he does not use drugs.  ADVANCE DIRECTIVES:  None on file  CODE STATUS: Full code  PAST MEDICAL HISTORY: Past Medical History:  Diagnosis Date   Headache    migraines   Hepatitis    HX OF hEP c ?    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    PAST SURGICAL HISTORY:  Past Surgical History:  Procedure Laterality Date   IR RADIOLOGIST EVAL & MGMT  01/25/2020   IR RADIOLOGIST EVAL & MGMT  03/21/2020   IR RADIOLOGIST EVAL & MGMT  06/07/2020   IR RADIOLOGIST EVAL & MGMT  09/05/2020   IR RADIOLOGIST EVAL & MGMT  12/05/2020   IR RADIOLOGIST EVAL & MGMT  05/09/2021   IR RADIOLOGIST EVAL & MGMT  09/06/2021   IR RADIOLOGIST EVAL & MGMT  12/12/2021   IR RADIOLOGIST EVAL & MGMT  06/25/2022   IR RADIOLOGIST EVAL & MGMT  07/24/2022   RADIOLOGY WITH ANESTHESIA N/A 03/01/2020   Procedure: CT WITH ANESTHESIA  THERMAL MICROWAVE  ABLATIION;  Surgeon: [name], MD;  Location: [facility];  Service: Anesthesiology;  Laterality: N/A;   RADIOLOGY WITH ANESTHESIA N/A 07/17/2022   Procedure: CT MICROWAVE ABLATION;  Surgeon: [name], MD;  Location: [facility];  Service: Radiology;  Laterality:  N/A;    HEMATOLOGY/ONCOLOGY HISTORY:  Oncology History  Hepatocellular carcinoma (HCC)  12/08/2019 Imaging   MR ABDOMEN WWO CONTRAST   IMPRESSION: Two small masses in the right hepatic lobe which have characteristics diagnostic for hepatocellular carcinoma in the setting of cirrhosis. (LI-RADS Category 5: Definitely HCC)   No evidence of abdominal metastatic disease or other significant abnormality.   03/01/2020 Initial Diagnosis   Hepatocellular carcinoma (HCC)   03/01/2020 Procedure   CT GUIDE TISSUE ABLATION     IMPRESSION: Successful percutaneous thermal ablation.   Signed,   [name], MD, RPVI   06/02/2020 Imaging   MR ABDOMEN WWO CONTRAST   IMPRESSION: 1. There has been interval ablation at two adjacent sites of the subcapsular right lobe of the liver, hepatic segment VI. There is no residual contrast enhancement at these adjacent sites. LI-RADS category 5 TR, nonviable. 2. There are scattered, subcentimeter foci of arterial hyperenhancement of the anterior right lobe of the liver, for example adjacent to the ablation site measuring 3 mm and in the medial anterior right lobe of the liver measuring 6 mm. This larger focus is unchanged compared to prior. Others were not previously seen. These findings are nonspecific and consistent with LI-RADS category 3. Attention on follow-up.   09/02/2020 Imaging   MR ABDOMEN WWO CONTRAST     IMPRESSION: 1. Post radiofrequency ablation along the margin  of the RIGHT hemi liver. Heterogeneous appearance of this area on both T1 and T2. No sign of residual enhancement. LR TR nonviable. 2. Very subtle focus of restricted diffusion in the cephalad lateral segment LEFT hepatic lobe measuring 7 mm, corresponding to low signal on image 27 of series 21. Arterial enhancement is not seen in this area and this areas not seen on prior imaging. Not clear whether this represents a new lesion as this area is obscured largely  by artifact on most sequences on today's study and on previous imaging evaluations. LR category 3, consider a 3 to six-month follow-up with abdominal MRI. 3. Lesions of concern scattered about the RIGHT hepatic lobe may represent perfusional anomalies as they were not seen on today's study. 4. Stable mild dilation of the biliary tree.   11/27/2020 Imaging   MR ABDOMEN WWO CONTRAST   IMPRESSION: Minimal decrease in size of ablation defect in the peripheral anterior right hepatic lobe. No evidence of locally recurrent hepatocellular carcinoma or other suspicious liver lesions.   No evidence of metastatic disease or other acute findings.   04/03/2021 Imaging   MR ABDOMEN WWO CONTRAST   IMPRESSION: Stable ablation defect in the lateral right hepatic lobe, without evidence of recurrent tumor at this site.   5 mm hypovascular lesion in segment 5 of the right lobe was not definitely seen on previous study, but is too small to characterize. This shows no arterial phase hyperenhancement. Recommend continued follow-up by MRI in 6 months.   No evidence of abdominal metastatic disease.        09/03/2021 Imaging   MR ABDOMEN WWO CONTRAST   IMPRESSION: 1. Side-by-side ablation sites in the right hepatic lobe in segment 6. The more anterior site remains largely cystic in appearance and no findings suspicious recurrent tumor in this area. There is however abnormal contrast enhancement around the larger more posterior and lateral ablation site worrisome for recurrent tumor. 2. Several small foci of early arterial phase enhancement in the liver. The peripheral lesions are likely vascular shunts. The more central lesions are likely dysplastic nodules. Recommend continued surveillance. 3. Stable bilateral renal cysts.   12/07/2021 Imaging   MR ABDOMEN WWO CONTRAST  IMPRESSION: 1. Ablation defects in the right lobe of the liver, similar to the prior study, without definitive evidence  to suggest local recurrence of disease or metastatic disease. 2. Multiple tiny arterial phase areas of nodular hyperenhancement scattered throughout the liver, predominantly in the right lobe of the liver, without perceptible signal abnormality or diffusion restriction on other pulse sequences, nonspecific, and likely small benign areas of vascular shunting. Continued attention on follow-up imaging is recommended. 3. Multiple Bosniak class 1 and Bosniak class 2 cysts in the kidneys bilaterally, similar to the prior study, as above. 4. Aortic atherosclerosis.     06/01/2022 Imaging   MR ABDOMEN WWO CONTRAST   IMPRESSION: 1. LR TR equivocal but suspicious findings with respect to previously treated disease in the RIGHT hepatic lobe given nodular enhancement adjacent and anterior superior to the ablation zone as described. 2. Given lack of late arterial phase due to bolus timing a second area of abnormality within the liver is suspicious for additional site of hepatocellular carcinoma showing washout appearance and capsule appearance on current imaging, cardiac pulsation limiting assessment in the area of concern. 3. Suspect bony metastatic disease in the lower lumbar spine at L5 level. Dedicated spinal imaging may be helpful for further evaluation.     06/18/2022 Imaging  MR Lumbar Spine W Wo Contrast   IMPRESSION: L5 vertebral body lesion is most consistent with a metastasis. There is probable trace ventral epidural extension   07/17/2022 Procedure   CT GUIDE TISSUE ABLATION   IMPRESSION: Successful CT-guided microwave ablation of recurrent hepatocellular carcinoma.   07/17/2022 Procedure   CT BONE TROCAR/NEEDLE BIOPSY SUPERFICIAL   IMPRESSION: Successful CT-guided core biopsy of L5 bone lesion.      07/17/2022 Pathology Results   SURGICAL PATHOLOGY  CASE: [id number]  PATIENT: Corey Armstrong  Surgical Pathology Report   SURGICAL PATHOLOGY  CASE: [id number]   PATIENT: Malachy Mood  Surgical Pathology Report      Clinical History: Hx of cirrhosis and small HCC with unexpected  suspicious lesion at L5. Rare HCC met to bone vs atypical hemangioma or  met from new primary. (crm)      FINAL MICROSCOPIC DIAGNOSIS:   A. 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 No acute findings within the chest. No suspicious findings for metastatic disease within the chest. 2. Unchanged 3 mm nodule within the medial right upper lobe. No new or suspicious lung nodules. 3. Suboptimal visualization of the known liver lesions, reflecting lack of IV contrast material. Similar appearance of ablation defect within the lateral right lobe of liver. 4. Left renal calculi. 5. Coronary artery calcifications. 6.  Aortic Atherosclerosis   06/25/2023 Imaging   NM Bone Scan whole body  IMPRESSION: 1. Known L5 metastasis demonstrates low level uptake, mildly increased from previous bone scan. 2. No new lesions demonstrated.   06/30/2023 -  Chemotherapy   Patient is on Treatment Plan : Hepatocellular Carcinoma Tremelimumab-actl C1 D1 + Durvalumab q28d       Imaging       ALLERGIES:  has No Known Allergies.  MEDICATIONS:  Current Outpatient Medications  Medication Sig Dispense Refill   gabapentin (NEURONTIN) 300 MG capsule Take 1 cap in morning and lunch, 2 cap at bed time 120 capsule 0   losartan-hydrochlorothiazide (HYZAAR) 100-25 MG tablet Take 0.5 tablets by mouth daily. 1 tablet 0   meloxicam (MOBIC) 15 MG tablet Take 1 tablet (15 mg total) by mouth daily. 30 tablet 0   oxyCODONE (OXY IR/ROXICODONE) 5 MG immediate release tablet Take 1-2 tablets (5-10 mg total) by mouth every 4 (four) hours as needed for up to 7 days for severe pain. 75 tablet 0   No current  facility-administered medications for this visit.    VITAL SIGNS: There were no vitals taken for this visit. There were no vitals filed for this visit.  Estimated body mass index is 19.07 kg/m as calculated from the following:   Height as of 06/30/23: 5\' 11"  (1.803 m).   Weight as of 06/30/23: 136 lb 12 oz (62 kg).  LABS: CBC:    Component Value Date/Time   WBC 6.3 06/27/2023 1210   WBC 4.6 11/18/2022 0852   HGB 12.9 (L) 06/27/2023 1210   HCT 39.6 06/27/2023 1210   PLT 209 06/27/2023 1210   MCV 100.5 (H) 06/27/2023 1210   NEUTROABS 4.5 06/27/2023 1210   LYMPHSABS 1.1 06/27/2023 1210   MONOABS 0.5 06/27/2023 1210   EOSABS 0.2 06/27/2023 1210   BASOSABS 0.0 06/27/2023 1210   Comprehensive Metabolic Panel:    Component Value Date/Time   NA 142 06/27/2023 1210   K 3.9  06/27/2023 1210   CL 113 (H) 06/27/2023 1210   CO2 24 06/27/2023 1210   BUN 31 (H) 06/27/2023 1210   CREATININE 1.27 (H) 06/27/2023 1210   CREATININE 1.07 09/19/2021 1412   GLUCOSE 120 (H) 06/27/2023 1210   CALCIUM 9.8 06/27/2023 1210   AST 17 06/27/2023 1210   ALT 15 06/27/2023 1210   ALKPHOS 79 06/27/2023 1210   BILITOT 0.4 06/27/2023 1210   PROT 7.9 06/27/2023 1210   ALBUMIN 4.3 06/27/2023 1210    RADIOGRAPHIC STUDIES: CT Chest Wo Contrast  Result Date: 06/27/2023 CLINICAL DATA:  Hepatocellular carcinoma with osseous metastasis. Staging. * Tracking Code: BO * EXAM: CT CHEST WITHOUT CONTRAST TECHNIQUE: Multidetector CT imaging of the chest was performed following the standard protocol without IV contrast. RADIATION DOSE REDUCTION: This exam was performed according to the departmental dose-optimization program which includes automated exposure control, adjustment of the mA and/or kV according to patient size and/or use of iterative reconstruction technique. COMPARISON:  09/19/2022 FINDINGS: Cardiovascular: Normal heart size. Aortic atherosclerosis and coronary artery calcifications. No pericardial effusion  identified. Mediastinum/Nodes: No enlarged mediastinal or axillary lymph nodes. Thyroid gland, trachea, and esophagus demonstrate no significant findings. Lungs/Pleura: No pleural effusion, airspace consolidation or atelectasis. Unchanged 3 mm nodule within the medial right upper lobe, image 46/8. No new or suspicious lung nodules. Upper Abdomen: No acute abnormality. Stones identified within the upper pole of the left kidney. Suboptimal visualization of the known liver lesions, reflecting lack of IV contrast material. Ablation defect is identified within the lateral right hepatic lobe measuring 2.8 by 2.4 cm, image 145/2. Calcifications are again seen along the dome of the liver., Musculoskeletal: No aggressive lytic or sclerotic bone lesions. IMPRESSION: 1. No acute findings within the chest. No suspicious findings for metastatic disease within the chest. 2. Unchanged 3 mm nodule within the medial right upper lobe. No new or suspicious lung nodules. 3. Suboptimal visualization of the known liver lesions, reflecting lack of IV contrast material. Similar appearance of ablation defect within the lateral right lobe of liver. 4. Left renal calculi. 5. Coronary artery calcifications. 6.  Aortic Atherosclerosis (ICD10-I70.0). Electronically Signed   By: Signa Kell M.D.   On: 06/27/2023 10:31   NM Bone Scan Whole Body  Result Date: 06/27/2023 CLINICAL DATA:  Hepatocellular carcinoma with biopsy-proven L5 metastasis. EXAM: NUCLEAR MEDICINE WHOLE BODY BONE SCAN TECHNIQUE: Whole body anterior and posterior images were obtained approximately 3 hours after intravenous injection of radiopharmaceutical. RADIOPHARMACEUTICALS:  21.1 mCi Technetium-8m MDP IV COMPARISON:  Whole-body bone scan 09/19/2022. CT of the chest 06/25/2023, CT abdomen and pelvis 01/02/2023 and lumbar MRI 06/07/2023. FINDINGS: There is subtle uptake within the known L5 metastasis, best seen on the anterior images. The uptake in the L5 vertebral body  has mildly increased from the previous bone scan. No other osseous uptake suspicious for metastatic disease. Mild degenerative uptake in the shoulders. The soft tissue activity is normal. IMPRESSION: 1. Known L5 metastasis demonstrates low level uptake, mildly increased from previous bone scan. 2. No new lesions demonstrated. Electronically Signed   By: Carey Bullocks M.D.   On: 06/27/2023 10:26    PERFORMANCE STATUS (ECOG) : {CHL ONC ECOG WU:[id number]}  Review of Systems Unless otherwise noted, a complete review of systems is negative.  Physical Exam General: NAD Cardiovascular: regular rate and rhythm Pulmonary: clear ant fields Abdomen: soft, nontender, + bowel sounds Extremities: no edema, no joint deformities Skin: no rashes Neurological:  IMPRESSION: *** I introduced myself, Maygan RN, and Palliative's  role in collaboration with the oncology team. Concept of Palliative Care was introduced as specialized medical care for people and their families living with serious illness.  It focuses on providing relief from the symptoms and stress of a serious illness.  The goal is to improve quality of life for both the patient and the family. Values and goals of care important to patient and family were attempted to be elicited.    We discussed *** current illness and what it means in the larger context of *** on-going co-morbidities. Natural disease trajectory and expectations were discussed.  I discussed the importance of continued conversation with family and their medical providers regarding overall plan of care and treatment options, ensuring decisions are within the context of the patients values and GOCs.  PLAN: Established therapeutic relationship. Education provided on palliative's role in collaboration with their Oncology/Radiation team. I will plan to see patient back in 2-4 weeks in collaboration to other oncology appointments.    Patient expressed understanding and was in  agreement with this plan. He also understands that He can call the clinic at any time with any questions, concerns, or complaints.   Thank you for your referral and allowing Palliative to assist in Mr. Hobie Ancira Sr.'s care.   Number and complexity of problems addressed: ***HIGH - 1 or more chronic illnesses with SEVERE exacerbation, progression, or side effects of treatment - advanced cancer, pain. Any controlled substances utilized were prescribed in the context of palliative care.   Visit consisted of counseling and education dealing with the complex and emotionally intense issues of symptom management and palliative care in the setting of serious and potentially life-threatening illness.Greater than 50%  of this time was spent counseling and coordinating care related to the above assessment and plan.  Signed by: [name], AGPCNP-BC Palliative Medicine Team/ Cancer Center   *Please note that this is a verbal dictation therefore any spelling or grammatical errors are due to the "Dragon Medical One" system interpretation.

## 2023-07-10 ENCOUNTER — Encounter: Payer: Self-pay | Admitting: Hematology

## 2023-07-11 ENCOUNTER — Ambulatory Visit: Payer: Commercial Managed Care - PPO

## 2023-07-11 ENCOUNTER — Encounter: Payer: Self-pay | Admitting: Nurse Practitioner

## 2023-07-11 ENCOUNTER — Inpatient Hospital Stay (HOSPITAL_BASED_OUTPATIENT_CLINIC_OR_DEPARTMENT_OTHER): Payer: Commercial Managed Care - PPO | Admitting: Nurse Practitioner

## 2023-07-11 DIAGNOSIS — C7951 Secondary malignant neoplasm of bone: Secondary | ICD-10-CM

## 2023-07-11 DIAGNOSIS — G893 Neoplasm related pain (acute) (chronic): Secondary | ICD-10-CM

## 2023-07-11 DIAGNOSIS — R530 Neoplastic (malignant) related fatigue: Secondary | ICD-10-CM | POA: Diagnosis not present

## 2023-07-11 DIAGNOSIS — Z515 Encounter for palliative care: Secondary | ICD-10-CM

## 2023-07-11 DIAGNOSIS — C220 Liver cell carcinoma: Secondary | ICD-10-CM

## 2023-07-11 NOTE — Progress Notes (Signed)
Palliative Medicine [facility]  Telephone:(336) [phone] Fax:(336) [phone]   Name: Corey Bayley Sr. Date: 07/11/2023 MRN: [id number]  DOB: October 08, 1956  Patient Care Team: [name], MD as PCP - General (Internal Medicine) Malachy Mood, MD as Attending Physician (Hematology and Oncology) [name], [name], NP as Nurse Practitioner (Hospice and Palliative Medicine)   I connected with Corey Der Sr. on 07/11/23 at 12:00 PM EDT by phone and verified that I am speaking with the correct person using two identifiers.   I discussed the limitations, risks, security and privacy concerns of performing an evaluation and management service by telemedicine and the availability of in-person appointments. I also discussed with the patient that there may be a patient responsible charge related to this service. The patient expressed understanding and agreed to proceed.   Other persons participating in the visit and their role in the encounter: patient   Patient's location: Home  Provider's location: [facility]   Chief Complaint: Symptom Management   INTERVAL HISTORY: Corey Fittro Sr. is a 67 y.o. 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  Palliative ask to see for symptom and pain management and goals of care.   SOCIAL HISTORY:     reports that he has quit smoking. He has been exposed to tobacco smoke. He has never used smokeless tobacco. He reports that he does not currently use alcohol. He reports that he does not use drugs.  ADVANCE DIRECTIVES:  None on file  CODE STATUS: Full code  PAST MEDICAL HISTORY: Past Medical History:  Diagnosis Date   Headache    migraines   Hepatitis    HX OF hEP c ?    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  MEDICATIONS:  Current Outpatient  Medications  Medication Sig Dispense Refill   gabapentin (NEURONTIN) 300 MG capsule Take 1 cap in morning and lunch, 2 cap at bed time 120 capsule 0   losartan-hydrochlorothiazide (HYZAAR) 100-25 MG tablet Take 0.5 tablets by mouth daily. 1 tablet 0   meloxicam (MOBIC) 15 MG tablet Take 1 tablet (15 mg total) by mouth daily. 30 tablet 0   oxyCODONE (OXY IR/ROXICODONE) 5 MG immediate release tablet Take 1-2 tablets (5-10 mg total) by mouth every 4 (four) hours as needed for up to 7 days for severe pain. 75 tablet 0   No current facility-administered medications for this visit.    VITAL SIGNS: There were no vitals taken for this visit. There were no vitals filed for this visit.  Estimated body mass index is 18.91 kg/m as calculated from the following:   Height as of 07/09/23: 5\' 11"  (1.803 m).   Weight as of 07/09/23: 135 lb 9.6 oz (61.5 kg).   PERFORMANCE STATUS (ECOG) : 1 - Symptomatic but completely ambulatory    IMPRESSION:  I connected by phone with Corey Armstrong for symptom management follow-up. No acute distress identified. Denies nausea, vomiting, constipation, or diarrhea.   Neoplasm related pain Delvecchio reports feels much better controlled with recent adjustments to medications. He is tolerating well without unwanted side effects. Able to function during the day with no drowsiness. He is much appreciative of this.    We discussed at length his current pain regimen.  He is currently taking gabapentin 300 mg three times daily, oxycodone 5-10 mg as needed every  6-8 hours, and Mobic daily.  Only requires oxycodone 1-2 times daily. Pain and neuropathic discomfort improved with increase in gabapentin.   No adjustments needed to regimen at this time. We will continue to closely monitor and support.    All questions answered and support provided.  Will continue to closely follow and support.   Constipation Education provided on the use of daily stool softeners in the setting of  opioid use.  He does endorse occasional constipation however is not using regular stool softeners.  Encourage patient to begin taking MiraLAX once daily.   Goals of care   8/28- We discussed his current illness and what it means in the larger context of his on-going co-morbidities. Natural disease trajectory and expectations were discussed.   He is realistic in his understanding of his cancer diagnosis and current treatment plan.  Corey Armstrong is clear and his expressed wishes to continue to treat the treatable allow him every opportunity to continue to thrive while managing his symptoms alone for a decent quality of life.  We discussed Her current illness and what it means in the larger context of Her on-going co-morbidities. Natural disease trajectory and expectations were discussed.  I discussed the importance of continued conversation with family and their medical providers regarding overall plan of care and treatment options, ensuring decisions are within the context of the patients values and GOCs.  PLAN: Gabapentin 300mg  three times daily  Mobic daily Oxycodone as needed for pain I will plan to see patient back in 2-4 weeks in collaboration to other oncology appointments.   Patient expressed understanding and was in agreement with this plan. He also understands that He can call the clinic at any time with any questions, concerns, or complaints.   Any controlled substances utilized were prescribed in the context of palliative care. PDMP has been reviewed.    Visit consisted of counseling and education dealing with the complex and emotionally intense issues of symptom management and palliative care in the setting of serious and potentially life-threatening illness.Greater than 50%  of this time was spent counseling and coordinating care related to the above assessment and plan.  [name], AGPCNP-BC  Palliative Medicine Team/[facility]  *Please note that  this is a verbal dictation therefore any spelling or grammatical errors are due to the "Dragon Medical One" system interpretation.

## 2023-07-15 ENCOUNTER — Other Ambulatory Visit: Payer: Self-pay | Admitting: Internal Medicine

## 2023-07-15 ENCOUNTER — Other Ambulatory Visit: Payer: Self-pay | Admitting: Hematology

## 2023-07-15 DIAGNOSIS — C220 Liver cell carcinoma: Secondary | ICD-10-CM

## 2023-07-15 DIAGNOSIS — M792 Neuralgia and neuritis, unspecified: Secondary | ICD-10-CM

## 2023-07-15 DIAGNOSIS — M79605 Pain in left leg: Secondary | ICD-10-CM

## 2023-07-15 DIAGNOSIS — C7951 Secondary malignant neoplasm of bone: Secondary | ICD-10-CM

## 2023-07-16 ENCOUNTER — Encounter: Payer: Self-pay | Admitting: Physician Assistant

## 2023-07-18 ENCOUNTER — Telehealth: Payer: Self-pay | Admitting: Nurse Practitioner

## 2023-07-27 NOTE — Progress Notes (Unsigned)
Patient Care Team: [name], MD as PCP - General (Internal Medicine) Malachy Mood, MD as Attending Physician (Hematology and Oncology) [name], [name], NP as Nurse Practitioner (Hospice and Palliative Medicine)   CHIEF COMPLAINT: Follow up [facility]  Oncology History  Hepatocellular carcinoma (HCC)  12/08/2019 Imaging   MR ABDOMEN WWO CONTRAST   IMPRESSION: Two small masses in the right hepatic lobe which have characteristics diagnostic for hepatocellular carcinoma in the setting of cirrhosis. (LI-RADS Category 5: Definitely HCC)   No evidence of abdominal metastatic disease or other significant abnormality.   03/01/2020 Initial Diagnosis   Hepatocellular carcinoma (HCC)   03/01/2020 Procedure   CT GUIDE TISSUE ABLATION     IMPRESSION: Successful percutaneous thermal ablation.   Signed,   [name], MD, RPVI   06/02/2020 Imaging   MR ABDOMEN WWO CONTRAST   IMPRESSION: 1. There has been interval ablation at two adjacent sites of the subcapsular right lobe of the liver, hepatic segment VI. There is no residual contrast enhancement at these adjacent sites. LI-RADS category 5 TR, nonviable. 2. There are scattered, subcentimeter foci of arterial hyperenhancement of the anterior right lobe of the liver, for example adjacent to the ablation site measuring 3 mm and in the medial anterior right lobe of the liver measuring 6 mm. This larger focus is unchanged compared to prior. Others were not previously seen. These findings are nonspecific and consistent with LI-RADS category 3. Attention on follow-up.   09/02/2020 Imaging   MR ABDOMEN WWO CONTRAST     IMPRESSION: 1. Post radiofrequency ablation along the margin of the RIGHT hemi liver. Heterogeneous appearance of this area on both T1 and T2. No sign of residual enhancement. LR TR nonviable. 2. Very subtle focus of restricted diffusion in the cephalad lateral segment LEFT hepatic lobe  measuring 7 mm, corresponding to low signal on image 27 of series 21. Arterial enhancement is not seen in this area and this areas not seen on prior imaging. Not clear whether this represents a new lesion as this area is obscured largely by artifact on most sequences on today's study and on previous imaging evaluations. LR category 3, consider a 3 to six-month follow-up with abdominal MRI. 3. Lesions of concern scattered about the RIGHT hepatic lobe may represent perfusional anomalies as they were not seen on today's study. 4. Stable mild dilation of the biliary tree.   11/27/2020 Imaging   MR ABDOMEN WWO CONTRAST   IMPRESSION: Minimal decrease in size of ablation defect in the peripheral anterior right hepatic lobe. No evidence of locally recurrent hepatocellular carcinoma or other suspicious liver lesions.   No evidence of metastatic disease or other acute findings.   04/03/2021 Imaging   MR ABDOMEN WWO CONTRAST   IMPRESSION: Stable ablation defect in the lateral right hepatic lobe, without evidence of recurrent tumor at this site.   5 mm hypovascular lesion in segment 5 of the right lobe was not definitely seen on previous study, but is too small to characterize. This shows no arterial phase hyperenhancement. Recommend continued follow-up by MRI in 6 months.   No evidence of abdominal metastatic disease.        09/03/2021 Imaging   MR ABDOMEN WWO CONTRAST   IMPRESSION: 1. Side-by-side ablation sites in the right hepatic lobe in segment 6. The more anterior site remains largely cystic in appearance and no findings suspicious recurrent tumor in this area. There is however abnormal contrast enhancement around the larger more posterior and lateral  ablation site worrisome for recurrent tumor. 2. Several small foci of early arterial phase enhancement in the liver. The peripheral lesions are likely vascular shunts. The more central lesions are likely dysplastic nodules.  Recommend continued surveillance. 3. Stable bilateral renal cysts.   12/07/2021 Imaging   MR ABDOMEN WWO CONTRAST  IMPRESSION: 1. Ablation defects in the right lobe of the liver, similar to the prior study, without definitive evidence to suggest local recurrence of disease or metastatic disease. 2. Multiple tiny arterial phase areas of nodular hyperenhancement scattered throughout the liver, predominantly in the right lobe of the liver, without perceptible signal abnormality or diffusion restriction on other pulse sequences, nonspecific, and likely small benign areas of vascular shunting. Continued attention on follow-up imaging is recommended. 3. Multiple Bosniak class 1 and Bosniak class 2 cysts in the kidneys bilaterally, similar to the prior study, as above. 4. Aortic atherosclerosis.     06/01/2022 Imaging   MR ABDOMEN WWO CONTRAST   IMPRESSION: 1. LR TR equivocal but suspicious findings with respect to previously treated disease in the RIGHT hepatic lobe given nodular enhancement adjacent and anterior superior to the ablation zone as described. 2. Given lack of late arterial phase due to bolus timing a second area of abnormality within the liver is suspicious for additional site of hepatocellular carcinoma showing washout appearance and capsule appearance on current imaging, cardiac pulsation limiting assessment in the area of concern. 3. Suspect bony metastatic disease in the lower lumbar spine at L5 level. Dedicated spinal imaging may be helpful for further evaluation.     06/18/2022 Imaging   MR Lumbar Spine W Wo Contrast   IMPRESSION: L5 vertebral body lesion is most consistent with a metastasis. There is probable trace ventral epidural extension   07/17/2022 Procedure   CT GUIDE TISSUE ABLATION   IMPRESSION: Successful CT-guided microwave ablation of recurrent hepatocellular carcinoma.   07/17/2022 Procedure   CT BONE TROCAR/NEEDLE BIOPSY SUPERFICIAL    IMPRESSION: Successful CT-guided core biopsy of L5 bone lesion.      07/17/2022 Pathology Results   SURGICAL PATHOLOGY  CASE: [id number]  PATIENT: Corey Armstrong  Surgical Pathology Report   SURGICAL PATHOLOGY  CASE: [id number]  PATIENT: Malachy Mood  Surgical Pathology Report      Clinical History: Hx of cirrhosis and small HCC with unexpected  suspicious lesion at L5. Rare HCC met to bone vs atypical hemangioma or  met from new primary. (crm)      FINAL MICROSCOPIC DIAGNOSIS:   A. 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 No acute findings within the chest. No suspicious findings for metastatic disease within the chest. 2. Unchanged 3 mm nodule within the medial right upper lobe. No new or suspicious lung nodules. 3. Suboptimal visualization of the known liver lesions, reflecting lack of IV contrast material. Similar appearance of ablation defect within the lateral right lobe of liver. 4. Left renal calculi. 5. Coronary artery calcifications. 6.  Aortic Atherosclerosis   06/25/2023 Imaging   NM Bone Scan whole body  IMPRESSION: 1. Known L5 metastasis demonstrates low level uptake, mildly increased from previous bone scan. 2. No new lesions demonstrated.   06/30/2023 -  Chemotherapy   Patient is on Treatment Plan : Hepatocellular Carcinoma Tremelimumab-actl C1 D1 + Durvalumab q28d       Imaging  CURRENT THERAPY: Second line Durvalumab every 4 weeks, Imjudo on C1D1 only, started on 06/30/23   INTERVAL HISTORY Mr. Maton returns for follow up as scheduled. Last seen by Dr. Mosetta Putt 07/09/23 for C1 toxicity check; here today for C2.   ROS   Past Medical History:  Diagnosis Date   Headache    migraines   Hepatitis    HX OF hEP c ?    hepatocellular ca with bone mets  2021   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     Past Surgical History:  Procedure Laterality Date   IR RADIOLOGIST EVAL & MGMT  01/25/2020   IR RADIOLOGIST EVAL & MGMT  03/21/2020   IR RADIOLOGIST EVAL & MGMT  06/07/2020   IR RADIOLOGIST EVAL & MGMT  09/05/2020   IR RADIOLOGIST EVAL & MGMT  12/05/2020   IR RADIOLOGIST EVAL & MGMT  05/09/2021   IR RADIOLOGIST EVAL & MGMT  09/06/2021   IR RADIOLOGIST EVAL & MGMT  12/12/2021   IR RADIOLOGIST EVAL & MGMT  06/25/2022   IR RADIOLOGIST EVAL & MGMT  07/24/2022   RADIOLOGY WITH ANESTHESIA N/A 03/01/2020   Procedure: CT WITH ANESTHESIA  THERMAL MICROWAVE  ABLATIION;  Surgeon: [name], MD;  Location: [facility];  Service: Anesthesiology;  Laterality: N/A;   RADIOLOGY WITH ANESTHESIA N/A 07/17/2022   Procedure: CT MICROWAVE ABLATION;  Surgeon: [name], MD;  Location: [facility];  Service: Radiology;  Laterality: N/A;     Outpatient Encounter Medications as of 07/30/2023  Medication Sig   gabapentin (NEURONTIN) 300 MG capsule Take 1 capsule (300 mg total) by mouth 3 (three) times daily. TAKE 1 CAPSULE BY MOUTH EVERYDAY AT BEDTIME   losartan-hydrochlorothiazide (HYZAAR) 100-25 MG tablet TAKE 1 TABLET BY MOUTH EVERY DAY   meloxicam (MOBIC) 15 MG tablet Take 1 tablet (15 mg total) by mouth daily.   No facility-administered encounter medications on file as of 07/30/2023.     There were no vitals filed for this visit. There is no height or weight on file to calculate BMI.   PHYSICAL EXAM GENERAL:alert, no distress and comfortable SKIN: no rash  EYES: sclera clear NECK: without mass LYMPH:  no palpable cervical or supraclavicular lymphadenopathy  LUNGS: clear with normal breathing effort HEART: regular rate & rhythm, no lower extremity edema ABDOMEN: abdomen soft, non-tender and normal bowel sounds NEURO: alert & oriented x 3 with fluent speech, no focal motor/sensory deficits Breast exam:  PAC without erythema    CBC    Component Value  Date/Time   WBC 4.6 07/09/2023 1020   WBC 4.6 11/18/2022 0852   RBC 3.56 (L) 07/09/2023 1020   HGB 11.8 (L) 07/09/2023 1020   HCT 35.2 (L) 07/09/2023 1020   PLT 134 (L) 07/09/2023 1020   MCV 98.9 07/09/2023 1020   MCH 33.1 07/09/2023 1020   MCHC 33.5 07/09/2023 1020   RDW 13.8 07/09/2023 1020   LYMPHSABS 0.8 07/09/2023 1020   MONOABS 0.6 07/09/2023 1020   EOSABS 0.2 07/09/2023 1020   BASOSABS 0.0 07/09/2023 1020     CMP     Component Value Date/Time   NA 142 07/09/2023 1020   K 3.9 07/09/2023 1020   CL 111 07/09/2023 1020   CO2 27 07/09/2023 1020   GLUCOSE 95 07/09/2023 1020   BUN 28 (H) 07/09/2023 1020   CREATININE 1.21 07/09/2023 1020   CREATININE 1.07 09/19/2021 1412   CALCIUM 9.9 07/09/2023 1020   PROT 7.2 07/09/2023 1020  ALBUMIN 4.1 07/09/2023 1020   AST 17 07/09/2023 1020   ALT 14 07/09/2023 1020   ALKPHOS 67 07/09/2023 1020   BILITOT 0.3 07/09/2023 1020   GFRNONAA >60 07/09/2023 1020   GFRNONAA 60 11/14/2020 0958   GFRAA 70 11/14/2020 0958     ASSESSMENT & PLAN:Ravon Levon Sisney Sr. is a 67 y.o. male with    Hepatocellular carcinoma (HCC) -diagnosed 11/2019 by MRI for f/u of cirrhosis. S/p microwave ablation on 03/01/20. -MRI 06/01/22 showed suspicion for local recurrence and metastasis at L5. Biopsy of L5 confirmed metastatic HCC. S/p repeat microwave ablation on 07/17/22. -started lenvima 08/16/22 -Liver MRI on 04/23/2023 showed stable treated liver lesion, L5 bone mets has slightly increased in size.  Patient developed worsening low back pain in July 2024, repeated lumbar MRI showed significant disease progression in the L5 bone lesion.  -S/p palliative radiation to his L5 lesion for pain control 06/17/23 - 06/27/23 -Due to progression and TKI failure, he changed to Durvalumab and Imjudo 06/30/23    Metastatic cancer to bone [facility]) -received palliative RT to L5 bone met 8/6-8/16/2024  -continue pain management   Low back pain radiating to both legs -On  oxycodone, gabapentin and meloxicam        PLAN:  No orders of the defined types were placed in this encounter.     All questions were answered. The patient knows to call the clinic with any problems, questions or concerns. No barriers to learning were detected. I spent *** counseling the patient face to face. The total time spent in the appointment was *** and more than 50% was on counseling, review of test results, and coordination of care.   Santiago Glad, NP-C @DATE @

## 2023-07-29 NOTE — Progress Notes (Signed)
Palliative Medicine [facility]  [phone]) [phone] [phone]   Name: Corey Westermann Sr. Date: 07/29/2023 MRN: [id number]  DOB: Nov 22, 1955  Patient Care Team: [name], MD as PCP - General (Internal Medicine) Malachy Mood, MD as Attending Physician (Hematology and Oncology) [name], [name], NP as Nurse Practitioner (Hospice and Palliative Medicine)   INTERVAL HISTORY: Corey Trom Sr. is a 67 y.o. 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  Palliative ask to see for symptom and pain management and goals of care.   SOCIAL HISTORY:     reports that he has quit smoking. He has been exposed to tobacco smoke. He has never used smokeless tobacco. He reports that he does not currently use alcohol. He reports that he does not use drugs.  ADVANCE DIRECTIVES:  None on file  CODE STATUS: Full code  PAST MEDICAL HISTORY: Past Medical History:  Diagnosis Date   Headache    migraines   Hepatitis    HX OF hEP c ?    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  MEDICATIONS:  Current Outpatient Medications  Medication Sig Dispense Refill   gabapentin (NEURONTIN) 300 MG capsule Take 1 capsule (300 mg total) by mouth 3 (three) times daily. TAKE 1 CAPSULE BY MOUTH EVERYDAY AT BEDTIME 90 capsule 0   losartan-hydrochlorothiazide (HYZAAR) 100-25 MG tablet TAKE 1 TABLET BY MOUTH EVERY DAY 90 tablet 3   meloxicam (MOBIC) 15 MG tablet Take 1 tablet (15 mg total) by mouth daily. 30 tablet 1   No current facility-administered medications for this visit.    VITAL SIGNS: There were no vitals taken for this visit. There were no vitals filed for this visit.  Estimated body mass index is 18.91 kg/m as calculated from the following:   Height as of 07/09/23: 5\' 11"  (1.803 m).   Weight as  of 07/09/23: 135 lb 9.6 oz (61.5 kg).   PERFORMANCE STATUS (ECOG) : 1 - Symptomatic but completely ambulatory    IMPRESSION: Corey Armstrong presents to clinic for follow-up. Is having some leg pain. Is scheduled for doppler later today. Denies nausea, vomiting, constipation, or diarrhea. Appetite is good. He is trying to be active. States he was hopeful to return back to work however is now not scheduled to return until after the first of the year.   Neoplasm related pain Corey Armstrong states his pain is well controlled on current regimen. Taking all medication as directed.   We discussed at length his current pain regimen.  He is currently taking gabapentin 600 mg twice daily, and 900mg  at bedtime. Oxycodone 5-10 mg as needed every 6-8 hours, and Mobic daily.  Is not requiring oxycodone daily. Reports last dose 4 days ago. Pain and neuropathic discomfort improved with increase in gabapentin.   No adjustments needed to regimen at this time. We will continue to closely monitor and support.    All questions answered and support provided.  Will continue to closely follow and support.   Constipation Controlled on daily regimen.    Goals of care   8/28- We discussed his current illness and what it means in the larger context of his on-going co-morbidities. Natural disease trajectory and expectations were discussed.   He is realistic in his understanding of his cancer diagnosis and current treatment plan.  Corey Armstrong is  clear and his expressed wishes to continue to treat the treatable allow him every opportunity to continue to thrive while managing his symptoms alone for a decent quality of life.  We discussed Her current illness and what it means in the larger context of Her on-going co-morbidities. Natural disease trajectory and expectations were discussed.  I discussed the importance of continued conversation with family and their medical providers regarding overall plan of care and treatment  options, ensuring decisions are within the context of the patients values and GOCs.  PLAN: Gabapentin 600mg  twice daily and 900mg  at bedtime Mobic daily Oxycodone as needed for pain. Not requiring daily I will plan to see patient back in 2-4 weeks in collaboration to other oncology appointments.   Patient expressed understanding and was in agreement with this plan. He also understands that He can call the clinic at any time with any questions, concerns, or complaints.   Any controlled substances utilized were prescribed in the context of palliative care. PDMP has been reviewed.    Visit consisted of counseling and education dealing with the complex and emotionally intense issues of symptom management and palliative care in the setting of serious and potentially life-threatening illness.Greater than 50%  of this time was spent counseling and coordinating care related to the above assessment and plan.  [name], AGPCNP-BC  Palliative Medicine Team/[facility]  *Please note that this is a verbal dictation therefore any spelling or grammatical errors are due to the "[employer] One" system interpretation.

## 2023-07-30 ENCOUNTER — Inpatient Hospital Stay: Payer: Commercial Managed Care - PPO

## 2023-07-30 ENCOUNTER — Ambulatory Visit: Payer: Commercial Managed Care - PPO

## 2023-07-30 ENCOUNTER — Encounter: Payer: Self-pay | Admitting: Nurse Practitioner

## 2023-07-30 ENCOUNTER — Inpatient Hospital Stay: Payer: Commercial Managed Care - PPO | Attending: Hematology | Admitting: Nurse Practitioner

## 2023-07-30 VITALS — BP 136/60 | HR 74 | Temp 98.2°F | Resp 16 | Ht 71.0 in | Wt 134.8 lb

## 2023-07-30 DIAGNOSIS — C220 Liver cell carcinoma: Secondary | ICD-10-CM

## 2023-07-30 DIAGNOSIS — Z5112 Encounter for antineoplastic immunotherapy: Secondary | ICD-10-CM | POA: Diagnosis not present

## 2023-07-30 DIAGNOSIS — C7951 Secondary malignant neoplasm of bone: Secondary | ICD-10-CM | POA: Diagnosis not present

## 2023-07-30 LAB — CMP (CANCER CENTER ONLY)
ALT: 10 U/L (ref 0–44)
AST: 12 U/L — ABNORMAL LOW (ref 15–41)
Albumin: 4.3 g/dL (ref 3.5–5.0)
Alkaline Phosphatase: 74 U/L (ref 38–126)
Anion gap: 7 (ref 5–15)
BUN: 37 mg/dL — ABNORMAL HIGH (ref 8–23)
CO2: 21 mmol/L — ABNORMAL LOW (ref 22–32)
Calcium: 9.9 mg/dL (ref 8.9–10.3)
Chloride: 114 mmol/L — ABNORMAL HIGH (ref 98–111)
Creatinine: 1.29 mg/dL — ABNORMAL HIGH (ref 0.61–1.24)
GFR, Estimated: >60 mL/min (ref >60)
Glucose, Bld: 119 mg/dL — ABNORMAL HIGH (ref 70–99)
Potassium: 3.6 mmol/L (ref 3.5–5.1)
Sodium: 142 mmol/L (ref 135–145)
Total Bilirubin: 0.2 mg/dL — ABNORMAL LOW (ref 0.3–1.2)
Total Protein: 7.9 g/dL (ref 6.5–8.1)

## 2023-07-30 LAB — CBC WITH DIFFERENTIAL (CANCER CENTER ONLY)
Abs Immature Granulocytes: 0.03 10*3/uL (ref 0.00–0.07)
Basophils Absolute: 0 10*3/uL (ref 0.0–0.1)
Basophils Relative: 1 %
Eosinophils Absolute: 0.6 10*3/uL — ABNORMAL HIGH (ref 0.0–0.5)
Eosinophils Relative: 9 %
HCT: 35.6 % — ABNORMAL LOW (ref 39.0–52.0)
Hemoglobin: 11.6 g/dL — ABNORMAL LOW (ref 13.0–17.0)
Immature Granulocytes: 1 %
Lymphocytes Relative: 14 %
Lymphs Abs: 0.8 10*3/uL (ref 0.7–4.0)
MCH: 33 pg (ref 26.0–34.0)
MCHC: 32.6 g/dL (ref 30.0–36.0)
MCV: 101.4 fL — ABNORMAL HIGH (ref 80.0–100.0)
Monocytes Absolute: 0.6 10*3/uL (ref 0.1–1.0)
Monocytes Relative: 10 %
Neutro Abs: 4.1 10*3/uL (ref 1.7–7.7)
Neutrophils Relative %: 65 %
Platelet Count: 213 10*3/uL (ref 150–400)
RBC: 3.51 MIL/uL — ABNORMAL LOW (ref 4.22–5.81)
RDW: 14.2 % (ref 11.5–15.5)
WBC Count: 6.2 10*3/uL (ref 4.0–10.5)
nRBC: 0 % (ref 0.0–0.2)

## 2023-07-30 MED ORDER — SODIUM CHLORIDE 0.9 % IV SOLN: Freq: Once | INTRAVENOUS | Status: AC

## 2023-07-30 MED ORDER — SODIUM CHLORIDE 0.9 % IV SOLN
1500.0000 mg | Freq: Once | INTRAVENOUS | Status: AC
  Administered 2023-07-30: 1500 mg via INTRAVENOUS
  Filled 2023-07-30: qty 30

## 2023-07-30 NOTE — Patient Instructions (Signed)
[place] [facility]  Discharge Instructions: Thank you for choosing [facility] to provide your oncology and hematology care.   If you have a lab appointment with the Cancer Center, please go directly to the Cancer Center and check in at the registration area.   Wear comfortable clothing and clothing appropriate for easy access to any Portacath or PICC line.   We strive to give you quality time with your provider. You may need to reschedule your appointment if you arrive late (15 or more minutes).  Arriving late affects you and other patients whose appointments are after yours.  Also, if you miss three or more appointments without notifying the office, you may be dismissed from the clinic at the provider's discretion.      For prescription refill requests, have your pharmacy contact our office and allow 72 hours for refills to be completed.    Today you received the following chemotherapy and/or immunotherapy agents: Tremelimumab (Imjudo) and Durvalumab (Imfinzi)   To help prevent nausea and vomiting after your treatment, we encourage you to take your nausea medication as directed.  BELOW ARE SYMPTOMS THAT SHOULD BE REPORTED IMMEDIATELY: *FEVER GREATER THAN 100.4 F (38 C) OR HIGHER *CHILLS OR SWEATING *NAUSEA AND VOMITING THAT IS NOT CONTROLLED WITH YOUR NAUSEA MEDICATION *UNUSUAL SHORTNESS OF BREATH *UNUSUAL BRUISING OR BLEEDING *URINARY PROBLEMS (pain or burning when urinating, or frequent urination) *BOWEL PROBLEMS (unusual diarrhea, constipation, pain near the anus) TENDERNESS IN MOUTH AND THROAT WITH OR WITHOUT PRESENCE OF ULCERS (sore throat, sores in mouth, or a toothache) UNUSUAL RASH, SWELLING OR PAIN  UNUSUAL VAGINAL DISCHARGE OR ITCHING   Items with * indicate a potential emergency and should be followed up as soon as possible or go to the Emergency Department if any problems should occur.  Please show the CHEMOTHERAPY ALERT CARD or  IMMUNOTHERAPY ALERT CARD at check-in to the Emergency Department and triage nurse.  Should you have questions after your visit or need to cancel or reschedule your appointment, please contact [place] [facility]  Dept: [phone]  and follow the prompts.  Office hours are 8:00 a.m. to 4:30 p.m. Monday - Friday. Please note that voicemails left after 4:00 p.m. may not be returned until the following business day.  We are closed weekends and major holidays. You have access to a nurse at all times for urgent questions. Please call the main number to the clinic Dept: [phone] and follow the prompts.   For any non-urgent questions, you may also contact your provider using MyChart. We now offer e-Visits for anyone 71 and older to request care online for non-urgent symptoms. For details visit [email or web address].   Also download the MyChart app! Go to the app store, search "MyChart", open the app, select [facility], and log in with your MyChart username and password. Tremelimumab Injection What is this medication? TREMELIMUMAB (tre mel IM ue mab) treats liver cancer and lung cancer. It works by helping your immune system slow or stop the spread of cancer cells. It is a monoclonal antibody. This medicine may be used for other purposes; ask your health care provider or pharmacist if you have questions. COMMON BRAND NAME(S): IMJUDO What should I tell my care team before I take this medication? They need to know if you have any of these conditions: Autoimmune conditions, such as Crohn disease, ulcerative colitis, lupus Nervous system conditions, such as Guillain-Barre syndrome or myasthenia gravis An unusual or  allergic reaction to tremelimumab, other medications, foods, dyes, or preservatives Pregnant or trying to get pregnant Breastfeeding How should I use this medication? This medication is infused into a vein. It is given by your care team in a hospital or clinic  setting. A special MedGuide will be given to you before each treatment. Be sure to read this information carefully each time. Talk to your care team about the use of this medication in children. Special care may be needed. Overdosage: If you think you have taken too much of this medicine contact a poison control center or emergency room at once. NOTE: This medicine is only for you. Do not share this medicine with others. What if I miss a dose? Keep appointments for follow-up doses. It is important not to miss your dose. Call your care team if you are unable to keep an appointment. What may interact with this medication? Interactions have not been studied. This list may not describe all possible interactions. Give your health care provider a list of all the medicines, herbs, non-prescription drugs, or dietary supplements you use. Also tell them if you smoke, drink alcohol, or use illegal drugs. Some items may interact with your medicine. What should I watch for while using this medication? Your condition will be monitored carefully while you are receiving this medication. You may need blood work done while you are taking this medication. This medication may cause serious skin reactions. They can happen weeks to months after starting the medication. Contact your care team right away if you notice fevers or flu-like symptoms with a rash. The rash may be red or purple and then turn into blisters or peeling of the skin. You may also notice a red rash with swelling of the face, lips, or lymph nodes in your neck or under your arms. Tell your care team right away if you have any change in your eyesight. Talk to your care team if you may be pregnant. Serious birth defects can occur if you take this medication during pregnancy and for 3 months after the last dose. You will need a negative pregnancy test before starting this medication. Contraception is recommended while taking this medication and for 3 months  after the last dose. Your care team can help you find the option that works for you. Do not breastfeed while taking this medication and for 3 months after the last dose. What side effects may I notice from receiving this medication? Side effects that you should report to your care team as soon as possible: Allergic reactions--skin rash, itching, hives, swelling of the face, lips, tongue, or throat Dry cough, shortness of breath or trouble breathing Eye pain, redness, irritation, or discharge with blurry or decreased vision Heart muscle inflammation--unusual weakness or fatigue, shortness of breath, chest pain, fast or irregular heartbeat, dizziness, swelling of the ankles, feet, or hands Hormone gland problems--headache, sensitivity to light, unusual weakness or fatigue, dizziness, fast or irregular heartbeat, increased sensitivity to cold or heat, excessive sweating, constipation, hair loss, increased thirst or amount of urine, tremors or shaking, irritability Infusion reactions--chest pain, shortness of breath or trouble breathing, feeling faint or lightheaded Kidney injury (glomerulonephritis)--decrease in the amount of urine, red or dark brown urine, foamy or bubbly urine, swelling of the ankles, hands, or feet Liver injury--right upper belly pain, loss of appetite, nausea, light-colored stool, dark yellow or brown urine, yellowing skin or eyes, unusual weakness or fatigue Pain, tingling, or numbness in the hands or feet, muscle weakness, change in  vision, confusion or trouble speaking, loss of balance or coordination, trouble walking, seizures Pancreatitis--severe stomach pain that spreads to your back or gets worse after eating or when touched, fever, nausea, vomiting Rash, fever, and swollen lymph nodes Redness, blistering, peeling, or loosening of the skin, including inside the mouth Stomach pain that is severe, does not go away, or gets worse Sudden or severe stomach pain, bloody diarrhea,  fever, nausea, vomiting Side effects that usually do not require medical attention (report these to your care team if they continue or are bothersome): Bone, joint, or muscle pain Diarrhea Fatigue Loss of appetite Nausea Skin rash This list may not describe all possible side effects. Call your doctor for medical advice about side effects. You may report side effects to FDA at 1-800-FDA-1088. Where should I keep my medication? This medication is given in a hospital or clinic. It will not be stored at home. NOTE: This sheet is a summary. It may not cover all possible information. If you have questions about this medicine, talk to your doctor, pharmacist, or health care provider.  2024 Elsevier/Gold Standard (2022-12-16 00:00:00) Durvalumab Injection What is this medication? DURVALUMAB (dur VAL ue mab) treats some types of cancer. It works by helping your immune system slow or stop the spread of cancer cells. It is a monoclonal antibody. This medicine may be used for other purposes; ask your health care provider or pharmacist if you have questions. COMMON BRAND NAME(S): IMFINZI What should I tell my care team before I take this medication? They need to know if you have any of these conditions: Allogeneic stem cell transplant (uses someone else's stem cells) Autoimmune diseases, such as Crohn disease, ulcerative colitis, lupus History of chest radiation Nervous system problems, such as Guillain-Barre syndrome, myasthenia gravis Organ transplant An unusual or allergic reaction to durvalumab, other medications, foods, dyes, or preservatives Pregnant or trying to get pregnant Breast-feeding How should I use this medication? This medication is infused into a vein. It is given by your care team in a hospital or clinic setting. A special MedGuide will be given to you before each treatment. Be sure to read this information carefully each time. Talk to your care team about the use of this  medication in children. Special care may be needed. Overdosage: If you think you have taken too much of this medicine contact a poison control center or emergency room at once. NOTE: This medicine is only for you. Do not share this medicine with others. What if I miss a dose? Keep appointments for follow-up doses. It is important not to miss your dose. Call your care team if you are unable to keep an appointment. What may interact with this medication? Interactions have not been studied. This list may not describe all possible interactions. Give your health care provider a list of all the medicines, herbs, non-prescription drugs, or dietary supplements you use. Also tell them if you smoke, drink alcohol, or use illegal drugs. Some items may interact with your medicine. What should I watch for while using this medication? Your condition will be monitored carefully while you are receiving this medication. You may need blood work while taking this medication. This medication may cause serious skin reactions. They can happen weeks to months after starting the medication. Contact your care team right away if you notice fevers or flu-like symptoms with a rash. The rash may be red or purple and then turn into blisters or peeling of the skin. You may also notice a  red rash with swelling of the face, lips, or lymph nodes in your neck or under your arms. Tell your care team right away if you have any change in your eyesight. Talk to your care team if you may be pregnant. Serious birth defects can occur if you take this medication during pregnancy and for 3 months after the last dose. You will need a negative pregnancy test before starting this medication. Contraception is recommended while taking this medication and for 3 months after the last dose. Your care team can help you find the option that works for you. Do not breastfeed while taking this medication and for 3 months after the last dose. What side  effects may I notice from receiving this medication? Side effects that you should report to your care team as soon as possible: Allergic reactions--skin rash, itching, hives, swelling of the face, lips, tongue, or throat Dry cough, shortness of breath or trouble breathing Eye pain, redness, irritation, or discharge with blurry or decreased vision Heart muscle inflammation--unusual weakness or fatigue, shortness of breath, chest pain, fast or irregular heartbeat, dizziness, swelling of the ankles, feet, or hands Hormone gland problems--headache, sensitivity to light, unusual weakness or fatigue, dizziness, fast or irregular heartbeat, increased sensitivity to cold or heat, excessive sweating, constipation, hair loss, increased thirst or amount of urine, tremors or shaking, irritability Infusion reactions--chest pain, shortness of breath or trouble breathing, feeling faint or lightheaded Kidney injury (glomerulonephritis)--decrease in the amount of urine, red or dark brown urine, foamy or bubbly urine, swelling of the ankles, hands, or feet Liver injury--right upper belly pain, loss of appetite, nausea, light-colored stool, dark yellow or brown urine, yellowing skin or eyes, unusual weakness or fatigue Pain, tingling, or numbness in the hands or feet, muscle weakness, change in vision, confusion or trouble speaking, loss of balance or coordination, trouble walking, seizures Rash, fever, and swollen lymph nodes Redness, blistering, peeling, or loosening of the skin, including inside the mouth Sudden or severe stomach pain, bloody diarrhea, fever, nausea, vomiting Side effects that usually do not require medical attention (report these to your care team if they continue or are bothersome): Bone, joint, or muscle pain Diarrhea Fatigue Loss of appetite Nausea Skin rash This list may not describe all possible side effects. Call your doctor for medical advice about side effects. You may report side  effects to FDA at 1-800-FDA-1088. Where should I keep my medication? This medication is given in a hospital or clinic. It will not be stored at home. NOTE: This sheet is a summary. It may not cover all possible information. If you have questions about this medicine, talk to your doctor, pharmacist, or health care provider.  2024 Elsevier/Gold Standard (2022-03-12 00:00:00)

## 2023-07-31 ENCOUNTER — Encounter: Payer: Self-pay | Admitting: Nurse Practitioner

## 2023-07-31 ENCOUNTER — Other Ambulatory Visit: Payer: Commercial Managed Care - PPO

## 2023-07-31 ENCOUNTER — Ambulatory Visit: Payer: Commercial Managed Care - PPO | Admitting: Hematology

## 2023-07-31 ENCOUNTER — Inpatient Hospital Stay (HOSPITAL_BASED_OUTPATIENT_CLINIC_OR_DEPARTMENT_OTHER): Payer: Commercial Managed Care - PPO | Admitting: Nurse Practitioner

## 2023-07-31 ENCOUNTER — Ambulatory Visit (HOSPITAL_COMMUNITY)
Admission: RE | Admit: 2023-07-31 | Discharge: 2023-07-31 | Disposition: A | Discharge status: Home / Self Care | Payer: Commercial Managed Care - PPO | Source: Ambulatory Visit | Attending: Nurse Practitioner | Admitting: Nurse Practitioner

## 2023-07-31 VITALS — BP 139/70 | HR 92 | Temp 98.1°F | Resp 18 | Wt 135.1 lb

## 2023-07-31 DIAGNOSIS — C7951 Secondary malignant neoplasm of bone: Secondary | ICD-10-CM

## 2023-07-31 DIAGNOSIS — C220 Liver cell carcinoma: Secondary | ICD-10-CM | POA: Insufficient documentation

## 2023-07-31 DIAGNOSIS — M792 Neuralgia and neuritis, unspecified: Secondary | ICD-10-CM | POA: Diagnosis not present

## 2023-07-31 DIAGNOSIS — G893 Neoplasm related pain (acute) (chronic): Secondary | ICD-10-CM

## 2023-07-31 DIAGNOSIS — Z515 Encounter for palliative care: Secondary | ICD-10-CM

## 2023-08-01 ENCOUNTER — Encounter: Payer: Self-pay | Admitting: Hematology

## 2023-08-05 ENCOUNTER — Telehealth: Payer: Self-pay

## 2023-08-05 ENCOUNTER — Other Ambulatory Visit: Payer: Self-pay | Admitting: Hematology

## 2023-08-05 ENCOUNTER — Encounter: Payer: Self-pay | Admitting: Hematology

## 2023-08-05 DIAGNOSIS — C220 Liver cell carcinoma: Secondary | ICD-10-CM

## 2023-08-05 DIAGNOSIS — C7951 Secondary malignant neoplasm of bone: Secondary | ICD-10-CM

## 2023-08-05 DIAGNOSIS — M792 Neuralgia and neuritis, unspecified: Secondary | ICD-10-CM

## 2023-08-05 NOTE — Telephone Encounter (Addendum)
Called patient and relayed the message below as per Santiago Glad NP. Patient voiced full understanding.   ----- Message from Pollyann Samples sent at 08/01/2023 11:06 AM EDT ----- Please let pt know doppler is negative, no DVT. He can wear compression stockings and be sure to hydrate in the event his leg pain is from cramps.   Thanks, Clayborn Heron NP

## 2023-08-06 ENCOUNTER — Encounter: Payer: Self-pay | Admitting: Hematology

## 2023-08-06 ENCOUNTER — Other Ambulatory Visit: Payer: Self-pay | Admitting: Nurse Practitioner

## 2023-08-06 DIAGNOSIS — C7951 Secondary malignant neoplasm of bone: Secondary | ICD-10-CM

## 2023-08-06 DIAGNOSIS — C220 Liver cell carcinoma: Secondary | ICD-10-CM

## 2023-08-06 MED ORDER — MELOXICAM 15 MG PO TABS: 15.0000 mg | ORAL_TABLET | Freq: Every day | ORAL | 1 refills | Status: DC

## 2023-08-06 MED ORDER — GABAPENTIN 300 MG PO CAPS: 300.0000 mg | ORAL_CAPSULE | Freq: Three times a day (TID) | ORAL | 0 refills | Status: DC

## 2023-08-12 ENCOUNTER — Ambulatory Visit
Admission: RE | Admit: 2023-08-12 | Discharge: 2023-08-12 | Disposition: A | Discharge status: Home / Self Care | Payer: Commercial Managed Care - PPO | Source: Ambulatory Visit | Attending: Nurse Practitioner | Admitting: Nurse Practitioner

## 2023-08-12 NOTE — Progress Notes (Addendum)
Radiation Oncology         [phone]) [phone] ________________________________  Name: Corey Der Sr. 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 (as documented in provider EOT note)   The patient was available for call today.  The patient did note fatigue during radiation but has improved mildly. The patient did not note skin changes in the field of radiation during therapy. The patient has not noticed improvement in pain in the area(s) treated with radiation. The patient is not taking dexamethasone. The patient does not have symptoms of  weakness or loss of control of the extremities. The patient does not have symptoms of headache. The patient does not have symptoms of seizure or uncontrolled movement. The patient does have symptoms of changes in vision (mild blurriness). The patient does not have changes in speech. The patient does not have confusion.   The patient is scheduled for ongoing care with Dr. Mosetta Putt in medical oncology. The patient was encouraged to call if he develops concerns or questions regarding radiation.  This concludes the interaction.  Ruel Favors, LPN

## 2023-08-22 ENCOUNTER — Other Ambulatory Visit: Payer: Self-pay | Admitting: Nurse Practitioner

## 2023-08-22 DIAGNOSIS — G893 Neoplasm related pain (acute) (chronic): Secondary | ICD-10-CM

## 2023-08-22 DIAGNOSIS — Z515 Encounter for palliative care: Secondary | ICD-10-CM

## 2023-08-22 DIAGNOSIS — C7951 Secondary malignant neoplasm of bone: Secondary | ICD-10-CM

## 2023-08-22 DIAGNOSIS — C220 Liver cell carcinoma: Secondary | ICD-10-CM

## 2023-08-22 MED ORDER — OXYCODONE HCL 5 MG PO TABS
5.0000 mg | ORAL_TABLET | Freq: Four times a day (QID) | ORAL | 0 refills | Status: DC | PRN
Start: 2023-08-22 — End: 2023-09-24

## 2023-08-22 MED ORDER — LIDOCAINE 5 % EX PTCH
1.0000 | MEDICATED_PATCH | CUTANEOUS | 0 refills | Status: DC
Start: 2023-08-22 — End: 2023-10-22

## 2023-08-25 NOTE — Progress Notes (Unsigned)
Palliative Medicine [facility]  Telephone:([phone]) [phone] [phone]   Name: Corey Nix Sr. Date: 08/25/2023 MRN: [id number]  DOB: [birth year]  Patient Care Team: [name], MD as PCP - General (Internal Medicine) Malachy Mood, MD as Attending Physician (Hematology and Oncology) [name], [name], NP as Nurse Practitioner (Hospice and Palliative Medicine)   INTERVAL HISTORY: Corey Beh Sr. is a 67 y.o. 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  Palliative ask to see for symptom and pain management and goals of care.   SOCIAL HISTORY:     reports that he has quit smoking. He has been exposed to tobacco smoke. He has never used smokeless tobacco. He reports that he does not currently use alcohol. He reports that he does not use drugs.  ADVANCE DIRECTIVES:  None on file  CODE STATUS: Full code  PAST MEDICAL HISTORY: Past Medical History:  Diagnosis Date   Headache    migraines   Hepatitis    HX OF hEP c ?    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  MEDICATIONS:  Current Outpatient Medications  Medication Sig Dispense Refill   gabapentin (NEURONTIN) 300 MG capsule Take 1 capsule (300 mg total) by mouth 3 (three) times daily. 90 capsule 0   ibuprofen (ADVIL) 100 MG tablet Take 100 mg by mouth every 6 (six) hours as needed for fever.     lidocaine (LIDODERM) 5 % Place 1 patch onto the skin daily. Remove & Discard patch within 12 hours or as directed by MD 30 patch 0   losartan-hydrochlorothiazide (HYZAAR) 100-25 MG tablet TAKE 1 TABLET BY MOUTH EVERY DAY 90 tablet 3   meloxicam (MOBIC) 15 MG tablet Take 1 tablet (15 mg total) by mouth daily. 30 tablet 1   oxyCODONE (OXY IR/ROXICODONE) 5 MG immediate release tablet Take 1-2 tablets (5-10 mg total) by mouth every 6  (six) hours as needed for severe pain. 45 tablet 0   No current facility-administered medications for this visit.    VITAL SIGNS: There were no vitals taken for this visit. There were no vitals filed for this visit.  Estimated body mass index is 18.84 kg/m as calculated from the following:   Height as of 07/30/23: 5\' 11"  (1.803 m).   Weight as of 07/31/23: 135 lb 1.6 oz (61.3 kg).   PERFORMANCE STATUS (ECOG) : 1 - Symptomatic but completely ambulatory  Assessment NAD, resting in recliner RRR Normal breathing pattern Alert and oriented x3  IMPRESSION: I saw Corey Armstrong during his infusion. No acute distress. Patient's wife also present. Denies nausea, vomiting, constipation, or diarrhea. Some fatigue. Is trying to remain active however this has been limited due to increase in his pain. Appetite has been fair. Some days are better than others.    Neoplasm related pain Corey Armstrong reports over the past several weeks his pain has increased. He does not feel that pain is controlled as it once was despite current regimen. Reports pain in lower extremities and hip as sharp, shooting, constant, and throbbing. Rates pain at work a 10 out of 10. He is having to ambulate with a cane for stability due to pain and discomfort. Wife reports some days he will not get up due to pain and fatigue.    We discussed at length his current pain regimen.  He is currently taking gabapentin 600 mg twice daily and 900mg  at bedtimes. Oxycodone 5-10 mg as needed every 6-8 hours, and Mobic daily.  He was not requiring daily use of oxycodone until most recently. After further discussions he has been only taking gabapentin 300 mg twice daily and 600 mg at bedtime out of fear of running out of medication. He began stretching it out. We discussed importance of taking medications as prescribed with awareness as long as this is done he would not run out of his medications.   Education provided at length on use of long-acting  medication in addition to short-acting, efficacy, timing, safety, and potential side-effects. Patient and wife verbalized understanding. I initially planned to initiate Xtampza however insurance would not cover and patient had to trial hydrocodone ER which was approved. Recommended continued dose as prescribed of gabapentin. Discontinue use of mobic.    All questions answered and support provided.  Will continue to closely follow and support.   Constipation Education provided on the use of daily stool softeners in the setting of opioid use.  He does endorse occasional constipation however is not using regular stool softeners.  Encourage patient to begin taking MiraLAX once daily.   Goals of care   8/28- We discussed his current illness and what it means in the larger context of his on-going co-morbidities. Natural disease trajectory and expectations were discussed.   He is realistic in his understanding of his cancer diagnosis and current treatment plan.  Corey Armstrong is clear and his expressed wishes to continue to treat the treatable allow him every opportunity to continue to thrive while managing his symptoms alone for a decent quality of life.  We discussed Her current illness and what it means in the larger context of Her on-going co-morbidities. Natural disease trajectory and expectations were discussed.  I discussed the importance of continued conversation with family and their medical providers regarding overall plan of care and treatment options, ensuring decisions are within the context of the patients values and GOCs.  PLAN: Hydrocodone ER 20 mg every 12 hours.  Insurance denied Xtampza with request for trial of listed medication or fentanyl patch, methadone. Oxycodone 5-10 mg every 6 hours as needed for breakthrough pain Gabapentin 600 mg twice daily and 900mg  at bedtime Discontinue mobic  I will plan to see patient back in 2 weeks in collaboration to other oncology  appointments.   Patient expressed understanding and was in agreement with this plan. He also understands that He can call the clinic at any time with any questions, concerns, or complaints.   Any controlled substances utilized were prescribed in the context of palliative care. PDMP has been reviewed.    Visit consisted of counseling and education dealing with the complex and emotionally intense issues of symptom management and palliative care in the setting of serious and potentially life-threatening illness.  [name], AGPCNP-BC  Palliative Medicine Team/[facility]  *Please note that this is a verbal dictation therefore any spelling or grammatical errors are due to the "Dragon Medical One" system interpretation.

## 2023-08-26 NOTE — Progress Notes (Unsigned)
Patient Care Team: [name], MD as PCP - General (Internal Medicine) Corey Mood, MD as Attending Physician (Hematology and Oncology) [name], [name], NP as Nurse Practitioner (Hospice and Palliative Medicine)   CHIEF COMPLAINT: Follow up [facility]  Oncology History  Hepatocellular carcinoma (HCC)  12/08/2019 Imaging   MR ABDOMEN WWO CONTRAST   IMPRESSION: Two small masses in the right hepatic lobe which have characteristics diagnostic for hepatocellular carcinoma in the setting of cirrhosis. (LI-RADS Category 5: Definitely HCC)   No evidence of abdominal metastatic disease or other significant abnormality.   03/01/2020 Initial Diagnosis   Hepatocellular carcinoma (HCC)   03/01/2020 Procedure   CT GUIDE TISSUE ABLATION     IMPRESSION: Successful percutaneous thermal ablation.   Signed,   [name], MD, RPVI   06/02/2020 Imaging   MR ABDOMEN WWO CONTRAST   IMPRESSION: 1. There has been interval ablation at two adjacent sites of the subcapsular right lobe of the liver, hepatic segment VI. There is no residual contrast enhancement at these adjacent sites. LI-RADS category 5 TR, nonviable. 2. There are scattered, subcentimeter foci of arterial hyperenhancement of the anterior right lobe of the liver, for example adjacent to the ablation site measuring 3 mm and in the medial anterior right lobe of the liver measuring 6 mm. This larger focus is unchanged compared to prior. Others were not previously seen. These findings are nonspecific and consistent with LI-RADS category 3. Attention on follow-up.   09/02/2020 Imaging   MR ABDOMEN WWO CONTRAST     IMPRESSION: 1. Post radiofrequency ablation along the margin of the RIGHT hemi liver. Heterogeneous appearance of this area on both T1 and T2. No sign of residual enhancement. LR TR nonviable. 2. Very subtle focus of restricted diffusion in the cephalad lateral segment LEFT hepatic lobe  measuring 7 mm, corresponding to low signal on image 27 of series 21. Arterial enhancement is not seen in this area and this areas not seen on prior imaging. Not clear whether this represents a new lesion as this area is obscured largely by artifact on most sequences on today's study and on previous imaging evaluations. LR category 3, consider a 3 to six-month follow-up with abdominal MRI. 3. Lesions of concern scattered about the RIGHT hepatic lobe may represent perfusional anomalies as they were not seen on today's study. 4. Armstrong mild dilation of the biliary tree.   11/27/2020 Imaging   MR ABDOMEN WWO CONTRAST   IMPRESSION: Minimal decrease in size of ablation defect in the peripheral anterior right hepatic lobe. No evidence of locally recurrent hepatocellular carcinoma or other suspicious liver lesions.   No evidence of metastatic disease or other acute findings.   04/03/2021 Imaging   MR ABDOMEN WWO CONTRAST   IMPRESSION: Armstrong ablation defect in the lateral right hepatic lobe, without evidence of recurrent tumor at this site.   5 mm hypovascular lesion in segment 5 of the right lobe was not definitely seen on previous study, but is too small to characterize. This shows no arterial phase hyperenhancement. Recommend continued follow-up by MRI in 6 months.   No evidence of abdominal metastatic disease.        09/03/2021 Imaging   MR ABDOMEN WWO CONTRAST   IMPRESSION: 1. Side-by-side ablation sites in the right hepatic lobe in segment 6. The more anterior site remains largely cystic in appearance and no findings suspicious recurrent tumor in this area. There is however abnormal contrast enhancement around the larger more posterior and lateral  ablation site worrisome for recurrent tumor. 2. Several small foci of early arterial phase enhancement in the liver. The peripheral lesions are likely vascular shunts. The more central lesions are likely dysplastic nodules.  Recommend continued surveillance. 3. Armstrong bilateral renal cysts.   12/07/2021 Imaging   MR ABDOMEN WWO CONTRAST  IMPRESSION: 1. Ablation defects in the right lobe of the liver, similar to the prior study, without definitive evidence to suggest local recurrence of disease or metastatic disease. 2. Multiple tiny arterial phase areas of nodular hyperenhancement scattered throughout the liver, predominantly in the right lobe of the liver, without perceptible signal abnormality or diffusion restriction on other pulse sequences, nonspecific, and likely small benign areas of vascular shunting. Continued attention on follow-up imaging is recommended. 3. Multiple Bosniak class 1 and Bosniak class 2 cysts in the kidneys bilaterally, similar to the prior study, as above. 4. Aortic atherosclerosis.     06/01/2022 Imaging   MR ABDOMEN WWO CONTRAST   IMPRESSION: 1. LR TR equivocal but suspicious findings with respect to previously treated disease in the RIGHT hepatic lobe given nodular enhancement adjacent and anterior superior to the ablation zone as described. 2. Given lack of late arterial phase due to bolus timing a second area of abnormality within the liver is suspicious for additional site of hepatocellular carcinoma showing washout appearance and capsule appearance on current imaging, cardiac pulsation limiting assessment in the area of concern. 3. Suspect bony metastatic disease in the lower lumbar spine at L5 level. Dedicated spinal imaging may be helpful for further evaluation.     06/18/2022 Imaging   MR Lumbar Spine W Wo Contrast   IMPRESSION: L5 vertebral body lesion is most consistent with a metastasis. There is probable trace ventral epidural extension   07/17/2022 Procedure   CT GUIDE TISSUE ABLATION   IMPRESSION: Successful CT-guided microwave ablation of recurrent hepatocellular carcinoma.   07/17/2022 Procedure   CT BONE TROCAR/NEEDLE BIOPSY SUPERFICIAL    IMPRESSION: Successful CT-guided core biopsy of L5 bone lesion.      07/17/2022 Pathology Results   SURGICAL PATHOLOGY  CASE: [id number]  PATIENT: Corey Armstrong  Surgical Pathology Report   SURGICAL PATHOLOGY  CASE: [id number]  PATIENT: Corey Armstrong  Surgical Pathology Report      Clinical History: Hx of cirrhosis and small HCC with unexpected  suspicious lesion at L5. Rare HCC met to bone vs atypical hemangioma or  met from new primary. (crm)      FINAL MICROSCOPIC DIAGNOSIS:   A. 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 No acute findings within the chest. No suspicious findings for metastatic disease within the chest. 2. Unchanged 3 mm nodule within the medial right upper lobe. No new or suspicious lung nodules. 3. Suboptimal visualization of the known liver lesions, reflecting lack of IV contrast material. Similar appearance of ablation defect within the lateral right lobe of liver. 4. Left renal calculi. 5. Coronary artery calcifications. 6.  Aortic Atherosclerosis   06/25/2023 Imaging   NM Bone Scan whole body  IMPRESSION: 1. Known L5 metastasis demonstrates low level uptake, mildly increased from previous bone scan. 2. No new lesions demonstrated.   06/30/2023 -  Chemotherapy   Patient is on Treatment Plan : Hepatocellular Carcinoma Tremelimumab-actl C1 D1 + Durvalumab q28d       Imaging  CURRENT THERAPY: Second line Durvalumab every 4 weeks, Imjudo on C1D1 only, started on 06/30/23   INTERVAL HISTORY Corey Armstrong for follow up and treatment as scheduled. Last seen by me 07/30/23.  He tolerates infusions without much side effects but not sure it is doing anything.  He has rare vomiting.  Able to eat and drink.  Energy is down, he is less mobile/active due  to increased leg pain.  Denies any back pain.  Pain is from the right hip all the way down leg to toe, and occasionally in the left lower leg.  Currently an 8/9 with prolonged sitting, average pain is an 8 out of 10.  It is hard to get up in the morning, requiring a cane.  Denies loss of bladder or bowel control.  Denies obvious injury or fall.  He has been out of Mobic for the past month, taking gabapentin and oxycodone 2-3 times per day  ROS  All other systems reviewed and negative  Past Medical History:  Diagnosis Date   Headache    migraines   Hepatitis    HX OF hEP c ?    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     Past Surgical History:  Procedure Laterality Date   IR RADIOLOGIST EVAL & MGMT  01/25/2020   IR RADIOLOGIST EVAL & MGMT  03/21/2020   IR RADIOLOGIST EVAL & MGMT  06/07/2020   IR RADIOLOGIST EVAL & MGMT  09/05/2020   IR RADIOLOGIST EVAL & MGMT  12/05/2020   IR RADIOLOGIST EVAL & MGMT  05/09/2021   IR RADIOLOGIST EVAL & MGMT  09/06/2021   IR RADIOLOGIST EVAL & MGMT  12/12/2021   IR RADIOLOGIST EVAL & MGMT  06/25/2022   IR RADIOLOGIST EVAL & MGMT  07/24/2022   RADIOLOGY WITH ANESTHESIA N/A 03/01/2020   Procedure: CT WITH ANESTHESIA  THERMAL MICROWAVE  ABLATIION;  Surgeon: Corey Moan, MD;  Location: [facility];  Service: Anesthesiology;  Laterality: N/A;   RADIOLOGY WITH ANESTHESIA N/A 07/17/2022   Procedure: CT MICROWAVE ABLATION;  Surgeon: [name], MD;  Location: [facility];  Service: Radiology;  Laterality: N/A;     Outpatient Encounter Medications as of 08/27/2023  Medication Sig   gabapentin (NEURONTIN) 300 MG capsule Take 1 capsule (300 mg total) by mouth 3 (three) times daily.   ibuprofen (ADVIL) 100 MG tablet Take 100 mg by mouth every 6 (six) hours as needed for fever.   lidocaine (LIDODERM) 5 % Place 1 patch onto the skin daily. Remove & Discard patch within 12 hours or as directed by MD   losartan-hydrochlorothiazide  (HYZAAR) 100-25 MG tablet TAKE 1 TABLET BY MOUTH EVERY DAY   meloxicam (MOBIC) 15 MG tablet Take 1 tablet (15 mg total) by mouth daily.   ondansetron (ZOFRAN) 8 MG tablet Take 1 tablet (8 mg total) by mouth every 8 (eight) hours as needed for nausea or vomiting.   oxyCODONE (OXY IR/ROXICODONE) 5 MG immediate release tablet Take 1-2 tablets (5-10 mg total) by mouth every 6 (six) hours as needed for severe pain.   No facility-administered encounter medications on file as of 08/27/2023.     Today's Vitals   08/27/23 0948 08/27/23 0955  BP: 135/65   Pulse: 92   Resp: 18   Temp: 98.2 F (36.8 C)   TempSrc: Tympanic   SpO2: 100%   Weight: 132 lb 9.6 oz (60.1 kg)   Height: 5\' 11"  (1.803  m)   PainSc:  8    Body mass index is 18.49 kg/m.   PHYSICAL EXAM GENERAL:alert, no distress and comfortable SKIN: no rash  EYES: sclera clear LUNGS: clear with normal breathing effort HEART: regular rate & rhythm, no lower extremity edema ABDOMEN: abdomen soft, non-tender and normal bowel sounds NEURO: alert & oriented x 3 with fluent speech.  Ambulating with a cane MSK: Right hip TTP   CBC    Component Value Date/Time   WBC 6.3 08/27/2023 0928   WBC 4.6 11/18/2022 0852   RBC 3.47 (L) 08/27/2023 0928   HGB 11.6 (L) 08/27/2023 0928   HCT 35.3 (L) 08/27/2023 0928   PLT 188 08/27/2023 0928   MCV 101.7 (H) 08/27/2023 0928   MCH 33.4 08/27/2023 0928   MCHC 32.9 08/27/2023 0928   RDW 14.0 08/27/2023 0928   LYMPHSABS 1.2 08/27/2023 0928   MONOABS 0.6 08/27/2023 0928   EOSABS 0.5 08/27/2023 0928   BASOSABS 0.0 08/27/2023 0928     CMP     Component Value Date/Time   NA 144 08/27/2023 0928   K 3.9 08/27/2023 0928   CL 111 08/27/2023 0928   CO2 22 08/27/2023 0928   GLUCOSE 91 08/27/2023 0928   BUN 31 (H) 08/27/2023 0928   CREATININE 1.38 (H) 08/27/2023 0928   CREATININE 1.07 09/19/2021 1412   CALCIUM 9.7 08/27/2023 0928   PROT 8.2 (H) 08/27/2023 0928   ALBUMIN 4.4 08/27/2023 0928    AST 22 08/27/2023 0928   ALT 26 08/27/2023 0928   ALKPHOS 62 08/27/2023 0928   BILITOT 0.8 08/27/2023 0928   GFRNONAA 57 (L) 08/27/2023 0928   GFRNONAA 60 11/14/2020 0958   GFRAA 70 11/14/2020 0958     ASSESSMENT & PLAN:Corey Levon Liskey Sr. is a 67 y.o. male with    Hepatocellular carcinoma -diagnosed 11/2019 by MRI for f/u of cirrhosis. S/p microwave ablation on 03/01/20. -MRI 06/01/22 showed suspicion for local recurrence and metastasis at L5. Biopsy of L5 confirmed metastatic HCC. S/p repeat microwave ablation on 07/17/22. -started lenvima 08/16/22 -Liver MRI on 04/23/2023 showed Armstrong treated liver lesion, L5 bone mets has slightly increased in size.  Patient developed worsening low back pain in July 2024, repeated lumbar MRI showed significant disease progression in the L5 bone lesion.  -S/p palliative radiation to his L5 lesion for pain control 06/17/23 - 06/27/23 -Due to progression and TKI failure, he changed to Durvalumab and Imjudo 06/30/23  -Corey Armstrong, s/p C2 tolerating well with mild n/v, I sent in a prescription for zofran PRN. Able to recover and function with adequate PS -Unfortunately his pain has not improved with completing RT and starting systemic treatment, overall appears worse since last visit.  -Exam with R hip ttp. Will get Xray to r/o fracture and proceed with restaging -Labs reviewed, proceed with C3 durvalumab today -F/up and C4 in 3 weeks   Metastatic cancer to bone, back and bilateral lower leg pain -S/p palliative RT to L5 bone met 06/17/23 - 06/27/23 -On gabapentin, oxycodone, and mobic (has not had for approx 1 month) per palliative care team -on 07/31/23 he had new b/l lower leg pain for 1 week, throbbing and sharp. Exam with mild generalized erythema over the lower legs, doppler was negative -Pain appears worse since last visit, exam with R hip/groin ttp. Will obtain Xray and restage  -Discussed pain regimen with Lowella Bandy, palliative care will see  him today     PLAN: -Labs reviewed -Proceed with C3  durvalumab today with additional 250 cc IVF plus oral liquids for Scr increase, he will hydrate -Rx: Zofran PRN -Reviewed pain management, defer to palliative care Lowella Bandy will see pt today -Stat R hip Xray today, will call with results -Restage after this cycle -F/up and C4 in 3 weeks   Orders Placed This Encounter  Procedures   DG Hip Unilat W or W/O Pelvis 1 View Right    Standing Status:   Future    Number of Occurrences:   1    Standing Expiration Date:   08/26/2024    Order Specific Question:   Reason for Exam (SYMPTOM  OR DIAGNOSIS REQUIRED)    Answer:   metastatic HCC, R hip pain/tenderness to palpation    Order Specific Question:   Preferred imaging location?    Answer:   [facility]   CT Chest Wo Contrast    Standing Status:   Future    Standing Expiration Date:   08/26/2024    Order Specific Question:   Preferred imaging location?    Answer:   [facility]   NM Bone Scan Whole Body    Standing Status:   Future    Standing Expiration Date:   08/26/2024    Order Specific Question:   If indicated for the ordered procedure, I authorize the administration of a radiopharmaceutical per Radiology protocol    Answer:   Yes    Order Specific Question:   Preferred imaging location?    Answer:   [facility]      All questions were answered. The patient knows to call the clinic with any problems, questions or concerns. No barriers to learning were detected. I spent 20 minutes counseling the patient face to face. The total time spent in the appointment was 30 minutes and more than 50% was on counseling, review of test results, and coordination of care.   Santiago Glad, NP-C 08/27/2023

## 2023-08-27 ENCOUNTER — Other Ambulatory Visit: Payer: Self-pay | Admitting: Nurse Practitioner

## 2023-08-27 ENCOUNTER — Inpatient Hospital Stay: Payer: Commercial Managed Care - PPO | Admitting: Nurse Practitioner

## 2023-08-27 ENCOUNTER — Other Ambulatory Visit (HOSPITAL_COMMUNITY): Payer: Self-pay

## 2023-08-27 ENCOUNTER — Ambulatory Visit (HOSPITAL_COMMUNITY)
Admission: RE | Admit: 2023-08-27 | Discharge: 2023-08-27 | Disposition: A | Discharge status: Home / Self Care | Payer: Commercial Managed Care - PPO | Source: Ambulatory Visit | Attending: Nurse Practitioner | Admitting: Nurse Practitioner

## 2023-08-27 ENCOUNTER — Encounter: Payer: Self-pay | Admitting: Nurse Practitioner

## 2023-08-27 ENCOUNTER — Inpatient Hospital Stay: Payer: Commercial Managed Care - PPO | Attending: Hematology

## 2023-08-27 ENCOUNTER — Inpatient Hospital Stay (HOSPITAL_BASED_OUTPATIENT_CLINIC_OR_DEPARTMENT_OTHER): Payer: Commercial Managed Care - PPO | Admitting: Nurse Practitioner

## 2023-08-27 VITALS — BP 135/65 | HR 92 | Temp 98.2°F | Resp 18 | Ht 71.0 in | Wt 132.6 lb

## 2023-08-27 DIAGNOSIS — K7460 Unspecified cirrhosis of liver: Secondary | ICD-10-CM | POA: Diagnosis not present

## 2023-08-27 DIAGNOSIS — I2510 Atherosclerotic heart disease of native coronary artery without angina pectoris: Secondary | ICD-10-CM | POA: Insufficient documentation

## 2023-08-27 DIAGNOSIS — C7951 Secondary malignant neoplasm of bone: Secondary | ICD-10-CM

## 2023-08-27 DIAGNOSIS — C220 Liver cell carcinoma: Secondary | ICD-10-CM

## 2023-08-27 DIAGNOSIS — Z515 Encounter for palliative care: Secondary | ICD-10-CM

## 2023-08-27 DIAGNOSIS — R911 Solitary pulmonary nodule: Secondary | ICD-10-CM | POA: Insufficient documentation

## 2023-08-27 DIAGNOSIS — M25551 Pain in right hip: Secondary | ICD-10-CM | POA: Diagnosis not present

## 2023-08-27 DIAGNOSIS — M792 Neuralgia and neuritis, unspecified: Secondary | ICD-10-CM

## 2023-08-27 DIAGNOSIS — R530 Neoplastic (malignant) related fatigue: Secondary | ICD-10-CM

## 2023-08-27 DIAGNOSIS — I700 Atherosclerosis of aorta: Secondary | ICD-10-CM | POA: Diagnosis not present

## 2023-08-27 DIAGNOSIS — N200 Calculus of kidney: Secondary | ICD-10-CM | POA: Diagnosis not present

## 2023-08-27 DIAGNOSIS — N281 Cyst of kidney, acquired: Secondary | ICD-10-CM | POA: Diagnosis not present

## 2023-08-27 DIAGNOSIS — I10 Essential (primary) hypertension: Secondary | ICD-10-CM | POA: Insufficient documentation

## 2023-08-27 DIAGNOSIS — G893 Neoplasm related pain (acute) (chronic): Secondary | ICD-10-CM

## 2023-08-27 DIAGNOSIS — Z5112 Encounter for antineoplastic immunotherapy: Secondary | ICD-10-CM | POA: Insufficient documentation

## 2023-08-27 LAB — CBC WITH DIFFERENTIAL (CANCER CENTER ONLY)
Abs Immature Granulocytes: 0.02 10*3/uL (ref 0.00–0.07)
Basophils Absolute: 0 10*3/uL (ref 0.0–0.1)
Basophils Relative: 1 %
Eosinophils Absolute: 0.5 10*3/uL (ref 0.0–0.5)
Eosinophils Relative: 8 %
HCT: 35.3 % — ABNORMAL LOW (ref 39.0–52.0)
Hemoglobin: 11.6 g/dL — ABNORMAL LOW (ref 13.0–17.0)
Immature Granulocytes: 0 %
Lymphocytes Relative: 18 %
Lymphs Abs: 1.2 10*3/uL (ref 0.7–4.0)
MCH: 33.4 pg (ref 26.0–34.0)
MCHC: 32.9 g/dL (ref 30.0–36.0)
MCV: 101.7 fL — ABNORMAL HIGH (ref 80.0–100.0)
Monocytes Absolute: 0.6 10*3/uL (ref 0.1–1.0)
Monocytes Relative: 9 %
Neutro Abs: 4 10*3/uL (ref 1.7–7.7)
Neutrophils Relative %: 64 %
Platelet Count: 188 10*3/uL (ref 150–400)
RBC: 3.47 MIL/uL — ABNORMAL LOW (ref 4.22–5.81)
RDW: 14 % (ref 11.5–15.5)
WBC Count: 6.3 10*3/uL (ref 4.0–10.5)
nRBC: 0 % (ref 0.0–0.2)

## 2023-08-27 LAB — CMP (CANCER CENTER ONLY)
ALT: 26 U/L (ref 0–44)
AST: 22 U/L (ref 15–41)
Albumin: 4.4 g/dL (ref 3.5–5.0)
Alkaline Phosphatase: 62 U/L (ref 38–126)
Anion gap: 11 (ref 5–15)
BUN: 31 mg/dL — ABNORMAL HIGH (ref 8–23)
CO2: 22 mmol/L (ref 22–32)
Calcium: 9.7 mg/dL (ref 8.9–10.3)
Chloride: 111 mmol/L (ref 98–111)
Creatinine: 1.38 mg/dL — ABNORMAL HIGH (ref 0.61–1.24)
GFR, Estimated: 57 mL/min — ABNORMAL LOW
Glucose, Bld: 91 mg/dL (ref 70–99)
Potassium: 3.9 mmol/L (ref 3.5–5.1)
Sodium: 144 mmol/L (ref 135–145)
Total Bilirubin: 0.8 mg/dL (ref 0.3–1.2)
Total Protein: 8.2 g/dL — ABNORMAL HIGH (ref 6.5–8.1)

## 2023-08-27 LAB — TSH: TSH: 1.573 u[IU]/mL (ref 0.350–4.500)

## 2023-08-27 MED ORDER — SODIUM CHLORIDE 0.9 % IV SOLN: Freq: Once | INTRAVENOUS | Status: AC

## 2023-08-27 MED ORDER — ONDANSETRON HCL 8 MG PO TABS: 8.0000 mg | ORAL_TABLET | Freq: Three times a day (TID) | ORAL | 3 refills | Status: DC | PRN

## 2023-08-27 MED ORDER — XTAMPZA ER 9 MG PO C12A
9.0000 mg | EXTENDED_RELEASE_CAPSULE | Freq: Two times a day (BID) | ORAL | 0 refills | Status: DC
  Filled 2023-08-27: qty 30, 15d supply, fill #0

## 2023-08-27 MED ORDER — HYDROCODONE BITARTRATE ER 20 MG PO CP12
20.0000 mg | ORAL_CAPSULE | Freq: Two times a day (BID) | ORAL | 0 refills | Status: DC
  Filled 2023-08-27: qty 30, 15d supply, fill #0

## 2023-08-27 MED ORDER — SODIUM CHLORIDE 0.9 % IV SOLN
1500.0000 mg | Freq: Once | INTRAVENOUS | Status: AC
  Administered 2023-08-27: 1500 mg via INTRAVENOUS
  Filled 2023-08-27: qty 30

## 2023-08-27 MED ORDER — GABAPENTIN 300 MG PO CAPS: ORAL_CAPSULE | ORAL | Status: DC

## 2023-08-27 NOTE — Patient Instructions (Addendum)
Palliative Instructions: - pick up and take your long acting pain medication xtampza, 9mg , take every 12 hours regardless of pain level (7am and 7pm OR 8am, 8pm, etc.)  - continue to take your oxycodone 1-2 pills every 6 hours as needed for your pain  - increase your gabapentin to 600mg  (2pills) in the morning and afternoon and 900mg  (3 pills) in the evening  - take 2 sennaKot at bedtime to prevent constipation - call the office at [phone] with any questions or concerns or if you develop a rash or itching with this new medication, also call 2-3 days prior to needing a refill to ensure you have a refill    - call 911 if you develop sudden shortness of breath  [facility]  Discharge Instructions: Thank you for choosing [facility] to provide your oncology and hematology care.   If you have a lab appointment with the Cancer Center, please go directly to the Cancer Center and check in at the registration area.   Wear comfortable clothing and clothing appropriate for easy access to any Portacath or PICC line.   We strive to give you quality time with your provider. You may need to reschedule your appointment if you arrive late (15 or more minutes).  Arriving late affects you and other patients whose appointments are after yours.  Also, if you miss three or more appointments without notifying the office, you may be dismissed from the clinic at the provider's discretion.      For prescription refill requests, have your pharmacy contact our office and allow 72 hours for refills to be completed.    Today you received the following chemotherapy and/or immunotherapy agents: Tremelimumab (Imjudo) and Durvalumab (Imfinzi)   To help prevent nausea and vomiting after your treatment, we encourage you to take your nausea medication as directed.  BELOW ARE SYMPTOMS THAT SHOULD BE REPORTED IMMEDIATELY: *FEVER GREATER THAN 100.4 F (38 C) OR  HIGHER *CHILLS OR SWEATING *NAUSEA AND VOMITING THAT IS NOT CONTROLLED WITH YOUR NAUSEA MEDICATION *UNUSUAL SHORTNESS OF BREATH *UNUSUAL BRUISING OR BLEEDING *URINARY PROBLEMS (pain or burning when urinating, or frequent urination) *BOWEL PROBLEMS (unusual diarrhea, constipation, pain near the anus) TENDERNESS IN MOUTH AND THROAT WITH OR WITHOUT PRESENCE OF ULCERS (sore throat, sores in mouth, or a toothache) UNUSUAL RASH, SWELLING OR PAIN  UNUSUAL VAGINAL DISCHARGE OR ITCHING   Items with * indicate a potential emergency and should be followed up as soon as possible or go to the Emergency Department if any problems should occur.  Please show the CHEMOTHERAPY ALERT CARD or IMMUNOTHERAPY ALERT CARD at check-in to the Emergency Department and triage nurse.  Should you have questions after your visit or need to cancel or reschedule your appointment, please contact [facility]  Dept: [phone]  and follow the prompts.  Office hours are 8:00 a.m. to 4:30 p.m. Monday - Friday. Please note that voicemails left after 4:00 p.m. may not be returned until the following business day.  We are closed weekends and major holidays. You have access to a nurse at all times for urgent questions. Please call the main number to the clinic Dept: [phone] and follow the prompts.   For any non-urgent questions, you may also contact your provider using MyChart. We now offer e-Visits for anyone 10 and older to request care online for non-urgent symptoms. For details visit [email or web address].   Also download the MyChart app!  Go to the app store, search "MyChart", open the app, select [place], and log in with your MyChart username and password. Tremelimumab Injection What is this medication? TREMELIMUMAB (tre mel IM ue mab) treats liver cancer and lung cancer. It works by helping your immune system slow or stop the spread of cancer cells. It is a monoclonal  antibody. This medicine may be used for other purposes; ask your health care provider or pharmacist if you have questions. COMMON BRAND NAME(S): IMJUDO What should I tell my care team before I take this medication? They need to know if you have any of these conditions: Autoimmune conditions, such as Crohn disease, ulcerative colitis, lupus Nervous system conditions, such as Guillain-Barre syndrome or myasthenia gravis An unusual or allergic reaction to tremelimumab, other medications, foods, dyes, or preservatives Pregnant or trying to get pregnant Breastfeeding How should I use this medication? This medication is infused into a vein. It is given by your care team in a hospital or clinic setting. A special MedGuide will be given to you before each treatment. Be sure to read this information carefully each time. Talk to your care team about the use of this medication in children. Special care may be needed. Overdosage: If you think you have taken too much of this medicine contact a poison control center or emergency room at once. NOTE: This medicine is only for you. Do not share this medicine with others. What if I miss a dose? Keep appointments for follow-up doses. It is important not to miss your dose. Call your care team if you are unable to keep an appointment. What may interact with this medication? Interactions have not been studied. This list may not describe all possible interactions. Give your health care provider a list of all the medicines, herbs, non-prescription drugs, or dietary supplements you use. Also tell them if you smoke, drink alcohol, or use illegal drugs. Some items may interact with your medicine. What should I watch for while using this medication? Your condition will be monitored carefully while you are receiving this medication. You may need blood work done while you are taking this medication. This medication may cause serious skin reactions. They can happen weeks  to months after starting the medication. Contact your care team right away if you notice fevers or flu-like symptoms with a rash. The rash may be red or purple and then turn into blisters or peeling of the skin. You may also notice a red rash with swelling of the face, lips, or lymph nodes in your neck or under your arms. Tell your care team right away if you have any change in your eyesight. Talk to your care team if you may be pregnant. Serious birth defects can occur if you take this medication during pregnancy and for 3 months after the last dose. You will need a negative pregnancy test before starting this medication. Contraception is recommended while taking this medication and for 3 months after the last dose. Your care team can help you find the option that works for you. Do not breastfeed while taking this medication and for 3 months after the last dose. What side effects may I notice from receiving this medication? Side effects that you should report to your care team as soon as possible: Allergic reactions--skin rash, itching, hives, swelling of the face, lips, tongue, or throat Dry cough, shortness of breath or trouble breathing Eye pain, redness, irritation, or discharge with blurry or decreased vision Heart muscle inflammation--unusual weakness or  fatigue, shortness of breath, chest pain, fast or irregular heartbeat, dizziness, swelling of the ankles, feet, or hands Hormone gland problems--headache, sensitivity to light, unusual weakness or fatigue, dizziness, fast or irregular heartbeat, increased sensitivity to cold or heat, excessive sweating, constipation, hair loss, increased thirst or amount of urine, tremors or shaking, irritability Infusion reactions--chest pain, shortness of breath or trouble breathing, feeling faint or lightheaded Kidney injury (glomerulonephritis)--decrease in the amount of urine, red or dark brown urine, foamy or bubbly urine, swelling of the ankles, hands, or  feet Liver injury--right upper belly pain, loss of appetite, nausea, light-colored stool, dark yellow or brown urine, yellowing skin or eyes, unusual weakness or fatigue Pain, tingling, or numbness in the hands or feet, muscle weakness, change in vision, confusion or trouble speaking, loss of balance or coordination, trouble walking, seizures Pancreatitis--severe stomach pain that spreads to your back or gets worse after eating or when touched, fever, nausea, vomiting Rash, fever, and swollen lymph nodes Redness, blistering, peeling, or loosening of the skin, including inside the mouth Stomach pain that is severe, does not go away, or gets worse Sudden or severe stomach pain, bloody diarrhea, fever, nausea, vomiting Side effects that usually do not require medical attention (report these to your care team if they continue or are bothersome): Bone, joint, or muscle pain Diarrhea Fatigue Loss of appetite Nausea Skin rash This list may not describe all possible side effects. Call your doctor for medical advice about side effects. You may report side effects to FDA at 1-800-FDA-1088. Where should I keep my medication? This medication is given in a hospital or clinic. It will not be stored at home. NOTE: This sheet is a summary. It may not cover all possible information. If you have questions about this medicine, talk to your doctor, pharmacist, or health care provider.  2024 Elsevier/Gold Standard (2022-12-16 00:00:00) Durvalumab Injection What is this medication? DURVALUMAB (dur VAL ue mab) treats some types of cancer. It works by helping your immune system slow or stop the spread of cancer cells. It is a monoclonal antibody. This medicine may be used for other purposes; ask your health care provider or pharmacist if you have questions. COMMON BRAND NAME(S): IMFINZI What should I tell my care team before I take this medication? They need to know if you have any of these  conditions: Allogeneic stem cell transplant (uses someone else's stem cells) Autoimmune diseases, such as Crohn disease, ulcerative colitis, lupus History of chest radiation Nervous system problems, such as Guillain-Barre syndrome, myasthenia gravis Organ transplant An unusual or allergic reaction to durvalumab, other medications, foods, dyes, or preservatives Pregnant or trying to get pregnant Breast-feeding How should I use this medication? This medication is infused into a vein. It is given by your care team in a hospital or clinic setting. A special MedGuide will be given to you before each treatment. Be sure to read this information carefully each time. Talk to your care team about the use of this medication in children. Special care may be needed. Overdosage: If you think you have taken too much of this medicine contact a poison control center or emergency room at once. NOTE: This medicine is only for you. Do not share this medicine with others. What if I miss a dose? Keep appointments for follow-up doses. It is important not to miss your dose. Call your care team if you are unable to keep an appointment. What may interact with this medication? Interactions have not been studied. This list may  not describe all possible interactions. Give your health care provider a list of all the medicines, herbs, non-prescription drugs, or dietary supplements you use. Also tell them if you smoke, drink alcohol, or use illegal drugs. Some items may interact with your medicine. What should I watch for while using this medication? Your condition will be monitored carefully while you are receiving this medication. You may need blood work while taking this medication. This medication may cause serious skin reactions. They can happen weeks to months after starting the medication. Contact your care team right away if you notice fevers or flu-like symptoms with a rash. The rash may be red or purple and then  turn into blisters or peeling of the skin. You may also notice a red rash with swelling of the face, lips, or lymph nodes in your neck or under your arms. Tell your care team right away if you have any change in your eyesight. Talk to your care team if you may be pregnant. Serious birth defects can occur if you take this medication during pregnancy and for 3 months after the last dose. You will need a negative pregnancy test before starting this medication. Contraception is recommended while taking this medication and for 3 months after the last dose. Your care team can help you find the option that works for you. Do not breastfeed while taking this medication and for 3 months after the last dose. What side effects may I notice from receiving this medication? Side effects that you should report to your care team as soon as possible: Allergic reactions--skin rash, itching, hives, swelling of the face, lips, tongue, or throat Dry cough, shortness of breath or trouble breathing Eye pain, redness, irritation, or discharge with blurry or decreased vision Heart muscle inflammation--unusual weakness or fatigue, shortness of breath, chest pain, fast or irregular heartbeat, dizziness, swelling of the ankles, feet, or hands Hormone gland problems--headache, sensitivity to light, unusual weakness or fatigue, dizziness, fast or irregular heartbeat, increased sensitivity to cold or heat, excessive sweating, constipation, hair loss, increased thirst or amount of urine, tremors or shaking, irritability Infusion reactions--chest pain, shortness of breath or trouble breathing, feeling faint or lightheaded Kidney injury (glomerulonephritis)--decrease in the amount of urine, red or dark brown urine, foamy or bubbly urine, swelling of the ankles, hands, or feet Liver injury--right upper belly pain, loss of appetite, nausea, light-colored stool, dark yellow or brown urine, yellowing skin or eyes, unusual weakness or  fatigue Pain, tingling, or numbness in the hands or feet, muscle weakness, change in vision, confusion or trouble speaking, loss of balance or coordination, trouble walking, seizures Rash, fever, and swollen lymph nodes Redness, blistering, peeling, or loosening of the skin, including inside the mouth Sudden or severe stomach pain, bloody diarrhea, fever, nausea, vomiting Side effects that usually do not require medical attention (report these to your care team if they continue or are bothersome): Bone, joint, or muscle pain Diarrhea Fatigue Loss of appetite Nausea Skin rash This list may not describe all possible side effects. Call your doctor for medical advice about side effects. You may report side effects to FDA at 1-800-FDA-1088. Where should I keep my medication? This medication is given in a hospital or clinic. It will not be stored at home. NOTE: This sheet is a summary. It may not cover all possible information. If you have questions about this medicine, talk to your doctor, pharmacist, or health care provider.  2024 Elsevier/Gold Standard (2022-03-12 00:00:00)

## 2023-08-27 NOTE — Patient Instructions (Addendum)
-   pick up and take your long acting pain medication xtampza, 9mg , take every 12 hours regardless of pain level (7am and 7pm OR 8am, 8pm, etc.)  - continue to take your oxycodone 1-2 pills every 6 hours as needed for your pain  - increase your gabapentin to 600mg  (2pills) in the morning and afternoon and 900mg  (3 pills) in the evening  - take 2 sennaKot at bedtime to prevent constipation - call the office at [phone] with any questions or concerns or if you develop a rash or itching with this new medication, also call 2-3 days prior to needing a refill to ensure you have a refill    - call 911 if you develop sudden shortness of breath

## 2023-08-29 LAB — T4: T4, Total: 8.1 ug/dL (ref 4.5–12.0)

## 2023-09-02 ENCOUNTER — Telehealth: Payer: Self-pay

## 2023-09-02 NOTE — Telephone Encounter (Signed)
Pt called by RN to check in concerning MyChart messages. Pt had previously reported nausea/vomiting with pain medications. Pt was previously taking on an empty stomach, this afternoon pt took with food and is tolerating medication well. Pt verbalized taking zofran as needed, instructed to take 30 mins prior to pain medication to help with n/v. Pt denies rash/itching and is otherwise "feeling fine". Pt instructed to call with any questions or concerns. No further needs at this time.

## 2023-09-12 ENCOUNTER — Encounter (HOSPITAL_COMMUNITY)
Admission: RE | Admit: 2023-09-12 | Discharge: 2023-09-12 | Disposition: A | Discharge status: Home / Self Care | Payer: Commercial Managed Care - PPO | Source: Ambulatory Visit | Attending: Nurse Practitioner

## 2023-09-12 ENCOUNTER — Ambulatory Visit (HOSPITAL_COMMUNITY)
Admission: RE | Admit: 2023-09-12 | Discharge: 2023-09-12 | Disposition: A | Discharge status: Home / Self Care | Payer: Commercial Managed Care - PPO | Source: Ambulatory Visit | Attending: Nurse Practitioner

## 2023-09-12 DIAGNOSIS — C220 Liver cell carcinoma: Secondary | ICD-10-CM | POA: Diagnosis present

## 2023-09-12 MED ORDER — TECHNETIUM TC 99M MEDRONATE IV KIT
20.0000 | PACK | Freq: Once | INTRAVENOUS | Status: AC | PRN
  Administered 2023-09-12: 19.2 via INTRAVENOUS

## 2023-09-15 ENCOUNTER — Other Ambulatory Visit (HOSPITAL_COMMUNITY): Payer: Self-pay

## 2023-09-15 ENCOUNTER — Other Ambulatory Visit: Payer: Self-pay | Admitting: Nurse Practitioner

## 2023-09-15 DIAGNOSIS — G893 Neoplasm related pain (acute) (chronic): Secondary | ICD-10-CM

## 2023-09-15 DIAGNOSIS — R110 Nausea: Secondary | ICD-10-CM

## 2023-09-15 DIAGNOSIS — Z515 Encounter for palliative care: Secondary | ICD-10-CM

## 2023-09-15 DIAGNOSIS — C7951 Secondary malignant neoplasm of bone: Secondary | ICD-10-CM

## 2023-09-15 DIAGNOSIS — C220 Liver cell carcinoma: Secondary | ICD-10-CM

## 2023-09-15 MED ORDER — HYDROCODONE BITARTRATE ER 20 MG PO CP12
20.0000 mg | ORAL_CAPSULE | Freq: Two times a day (BID) | ORAL | 0 refills | Status: DC
  Filled 2023-09-15: qty 60, 30d supply, fill #0

## 2023-09-15 MED ORDER — PROCHLORPERAZINE MALEATE 10 MG PO TABS
10.0000 mg | ORAL_TABLET | Freq: Four times a day (QID) | ORAL | 0 refills | Status: DC | PRN
Start: 2023-09-15 — End: 2023-10-22
  Filled 2023-09-15: qty 30, 8d supply, fill #0

## 2023-09-16 ENCOUNTER — Other Ambulatory Visit (HOSPITAL_COMMUNITY): Payer: Self-pay

## 2023-09-22 NOTE — Progress Notes (Unsigned)
Palliative Medicine [facility]  [phone] [phone]   Name: Corey Eurich Sr. Date: 09/22/2023 MRN: [id number]  DOB: 1956/07/13  Patient Care Team: [name], MD as PCP - General (Internal Medicine) Malachy Mood, MD as Attending Physician (Hematology and Oncology) [name], [name], NP as Nurse Practitioner (Hospice and Palliative Medicine)   INTERVAL HISTORY: Corey Quiros Sr. is a 67 y.o. 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  Palliative ask to see for symptom and pain management and goals of care.   SOCIAL HISTORY:     reports that he has quit smoking. He has been exposed to tobacco smoke. He has never used smokeless tobacco. He reports that he does not currently use alcohol. He reports that he does not use drugs.  ADVANCE DIRECTIVES:  None on file  CODE STATUS: Full code  PAST MEDICAL HISTORY: Past Medical History:  Diagnosis Date   Headache    migraines   Hepatitis    HX OF hEP c ?    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  MEDICATIONS:  Current Outpatient Medications  Medication Sig Dispense Refill   gabapentin (NEURONTIN) 300 MG capsule Take 2 capsules (600 mg) twice daily and 3 capsules (900 mg) at bedtime.     HYDROcodone Bitartrate ER 20 MG CP12 Take 20 mg by mouth every 12 (twelve) hours. 60 capsule 0   ibuprofen (ADVIL) 100 MG tablet Take 100 mg by mouth every 6 (six) hours as needed for fever.     lidocaine (LIDODERM) 5 % Place 1 patch onto the skin daily. Remove & Discard patch within 12 hours or as directed by MD 30 patch 0   losartan-hydrochlorothiazide (HYZAAR) 100-25 MG tablet TAKE 1 TABLET BY MOUTH EVERY DAY 90 tablet 3   ondansetron (ZOFRAN) 8 MG tablet Take 1 tablet (8 mg total) by mouth every 8 (eight) hours as needed  for nausea or vomiting. 30 tablet 3   oxyCODONE (OXY IR/ROXICODONE) 5 MG immediate release tablet Take 1-2 tablets (5-10 mg total) by mouth every 6 (six) hours as needed for severe pain. 45 tablet 0   prochlorperazine (COMPAZINE) 10 MG tablet Take 1 tablet (10 mg total) by mouth every 6 (six) hours as needed for nausea or vomiting. 30 tablet 0   No current facility-administered medications for this visit.    VITAL SIGNS: There were no vitals taken for this visit. There were no vitals filed for this visit.  Estimated body mass index is 18.49 kg/m as calculated from the following:   Height as of 08/27/23: 5\' 11"  (1.803 m).   Weight as of 08/27/23: 132 lb 9.6 oz (60.1 kg).   PERFORMANCE STATUS (ECOG) : 1 - Symptomatic but completely ambulatory  Assessment NAD, resting in recliner RRR Normal breathing pattern Alert and oriented x3  IMPRESSION: I saw Corey Armstrong during his infusion. No acute distress. Patient's wife also present. Denies nausea, vomiting, constipation, or diarrhea. Some fatigue. Is trying to remain active however this has been limited due to increase in his pain. Appetite has been fair. Some days are better than others.    Neoplasm related pain Corey Armstrong reports over the past several weeks his pain has increased. He does not feel that pain is controlled as it once was despite current regimen. Reports pain in lower extremities  and hip as sharp, shooting, constant, and throbbing. Rates pain at work a 10 out of 10. He is having to ambulate with a cane for stability due to pain and discomfort. Wife reports some days he will not get up due to pain and fatigue.    We discussed at length his current pain regimen.  He is currently taking gabapentin 600 mg twice daily and 900mg  at bedtimes. Oxycodone 5-10 mg as needed every 6-8 hours, and Mobic daily.  He was not requiring daily use of oxycodone until most recently. After further discussions he has been only taking gabapentin 300 mg  twice daily and 600 mg at bedtime out of fear of running out of medication. He began stretching it out. We discussed importance of taking medications as prescribed with awareness as long as this is done he would not run out of his medications.   Education provided at length on use of long-acting medication in addition to short-acting, efficacy, timing, safety, and potential side-effects. Patient and wife verbalized understanding. I initially planned to initiate Xtampza however insurance would not cover and patient had to trial hydrocodone ER which was approved. Recommended continued dose as prescribed of gabapentin. Discontinue use of mobic.    All questions answered and support provided.  Will continue to closely follow and support.   Constipation Education provided on the use of daily stool softeners in the setting of opioid use.  He does endorse occasional constipation however is not using regular stool softeners.  Encourage patient to begin taking MiraLAX once daily.   Goals of care   8/28- We discussed his current illness and what it means in the larger context of his on-going co-morbidities. Natural disease trajectory and expectations were discussed.   He is realistic in his understanding of his cancer diagnosis and current treatment plan.  Corey Armstrong is clear and his expressed wishes to continue to treat the treatable allow him every opportunity to continue to thrive while managing his symptoms alone for a decent quality of life.  We discussed Her current illness and what it means in the larger context of Her on-going co-morbidities. Natural disease trajectory and expectations were discussed.  I discussed the importance of continued conversation with family and their medical providers regarding overall plan of care and treatment options, ensuring decisions are within the context of the patients values and GOCs.  PLAN: Hydrocodone ER 20 mg every 12 hours.  Insurance denied Xtampza with  request for trial of listed medication or fentanyl patch, methadone. Oxycodone 5-10 mg every 6 hours as needed for breakthrough pain Gabapentin 600 mg twice daily and 900mg  at bedtime Discontinue mobic  I will plan to see patient back in 2 weeks in collaboration to other oncology appointments.   Patient expressed understanding and was in agreement with this plan. He also understands that He can call the clinic at any time with any questions, concerns, or complaints.   Any controlled substances utilized were prescribed in the context of palliative care. PDMP has been reviewed.    Visit consisted of counseling and education dealing with the complex and emotionally intense issues of symptom management and palliative care in the setting of serious and potentially life-threatening illness.  [name], AGPCNP-BC  Palliative Medicine Team/[facility]  *Please note that this is a verbal dictation therefore any spelling or grammatical errors are due to the "Dragon Medical One" system interpretation.

## 2023-09-23 NOTE — Assessment & Plan Note (Signed)
diagnosed 11/2019 by MRI for f/u of cirrhosis. S/p microwave ablation on 03/01/20. -MRI 06/01/22 showed suspicion for local recurrence and metastasis at L5. Biopsy of L5 confirmed metastatic HCC. S/p repeat microwave ablation on 07/17/22. -started lenvima 08/16/22 -staging CT CAP and bone scan on 09/19/22 showed: interval enlargement of right hepatic lobe HCC; new retropulsed bony fragment at L5; no other new lesions. His previous CT was in 05/2022 and he did not start Lenvima until 07/2022, so it's hard to evaluate if he is responding to [facility] or not, the progression is likely the nature course of his [facility]  -Restaging CT scan from January 02, 2023 showed slightly improved previously treated liver lesion, and additional area of concern in the liver which are difficult to compare with previous scan due to different timing of contrast.  No other evidence of disease progression.  Plan to obtain MRI as next scan.  I personally reviewed the CT scan images and discussed the findings with patient -Will continue Lenvima, he is tolerating well overall. -Due to his weight being above 60 kg now, I increased his Lenvima to 12 mg daily in Feb 2024 -repeated liver MRI on 04/23/2023 showed stable treated liver lesion, L5 bone mets has slightly increased in size.  Patient developed worsening low back pain in July 2024, repeated lumbar MRI showed significant disease progression in the L5 bone lesion.  I personally reviewed the scan images and discussed the findings with patient. -I recommend palliative radiation to his L5 lesion for pain control -I recommend changing systemic treatment to immunotherapy, I discussed option of atezolizumab and bevacizumab, durvalumab and Imjudo, navel and ipi etc. since he previously progressed on oral TKI, I recommend him to proceed with durvalumab and Imjudo. He started on 06/30/23 -the goal of therapy is curative/palliative to prolong life and prevent/improve cancer related symptoms.

## 2023-09-24 ENCOUNTER — Ambulatory Visit (HOSPITAL_COMMUNITY)
Admission: RE | Admit: 2023-09-24 | Discharge: 2023-09-24 | Disposition: A | Discharge status: Home / Self Care | Payer: Commercial Managed Care - PPO | Source: Ambulatory Visit | Attending: Hematology

## 2023-09-24 ENCOUNTER — Inpatient Hospital Stay: Payer: Commercial Managed Care - PPO

## 2023-09-24 ENCOUNTER — Encounter: Payer: Self-pay | Admitting: Hematology

## 2023-09-24 ENCOUNTER — Inpatient Hospital Stay (HOSPITAL_BASED_OUTPATIENT_CLINIC_OR_DEPARTMENT_OTHER): Payer: Commercial Managed Care - PPO | Admitting: Nurse Practitioner

## 2023-09-24 ENCOUNTER — Inpatient Hospital Stay: Payer: Commercial Managed Care - PPO | Attending: Hematology

## 2023-09-24 ENCOUNTER — Encounter: Payer: Self-pay | Admitting: Nurse Practitioner

## 2023-09-24 ENCOUNTER — Inpatient Hospital Stay: Payer: Commercial Managed Care - PPO | Admitting: Hematology

## 2023-09-24 VITALS — BP 107/55 | HR 71 | Temp 98.5°F | Resp 18 | Ht 71.0 in | Wt 128.7 lb

## 2023-09-24 DIAGNOSIS — C7951 Secondary malignant neoplasm of bone: Secondary | ICD-10-CM

## 2023-09-24 DIAGNOSIS — I700 Atherosclerosis of aorta: Secondary | ICD-10-CM | POA: Diagnosis not present

## 2023-09-24 DIAGNOSIS — C220 Liver cell carcinoma: Secondary | ICD-10-CM | POA: Insufficient documentation

## 2023-09-24 DIAGNOSIS — R7989 Other specified abnormal findings of blood chemistry: Secondary | ICD-10-CM | POA: Diagnosis not present

## 2023-09-24 DIAGNOSIS — R944 Abnormal results of kidney function studies: Secondary | ICD-10-CM | POA: Insufficient documentation

## 2023-09-24 DIAGNOSIS — R200 Anesthesia of skin: Secondary | ICD-10-CM | POA: Insufficient documentation

## 2023-09-24 DIAGNOSIS — I10 Essential (primary) hypertension: Secondary | ICD-10-CM | POA: Insufficient documentation

## 2023-09-24 DIAGNOSIS — Z79899 Other long term (current) drug therapy: Secondary | ICD-10-CM | POA: Diagnosis not present

## 2023-09-24 DIAGNOSIS — N281 Cyst of kidney, acquired: Secondary | ICD-10-CM | POA: Insufficient documentation

## 2023-09-24 DIAGNOSIS — I2510 Atherosclerotic heart disease of native coronary artery without angina pectoris: Secondary | ICD-10-CM | POA: Diagnosis not present

## 2023-09-24 DIAGNOSIS — R1115 Cyclical vomiting syndrome unrelated to migraine: Secondary | ICD-10-CM | POA: Diagnosis not present

## 2023-09-24 DIAGNOSIS — K7460 Unspecified cirrhosis of liver: Secondary | ICD-10-CM | POA: Insufficient documentation

## 2023-09-24 DIAGNOSIS — Z515 Encounter for palliative care: Secondary | ICD-10-CM

## 2023-09-24 DIAGNOSIS — R530 Neoplastic (malignant) related fatigue: Secondary | ICD-10-CM

## 2023-09-24 DIAGNOSIS — Z5112 Encounter for antineoplastic immunotherapy: Secondary | ICD-10-CM | POA: Diagnosis not present

## 2023-09-24 DIAGNOSIS — G893 Neoplasm related pain (acute) (chronic): Secondary | ICD-10-CM | POA: Diagnosis not present

## 2023-09-24 DIAGNOSIS — K5900 Constipation, unspecified: Secondary | ICD-10-CM | POA: Insufficient documentation

## 2023-09-24 DIAGNOSIS — R634 Abnormal weight loss: Secondary | ICD-10-CM

## 2023-09-24 DIAGNOSIS — R911 Solitary pulmonary nodule: Secondary | ICD-10-CM | POA: Diagnosis not present

## 2023-09-24 DIAGNOSIS — R630 Anorexia: Secondary | ICD-10-CM

## 2023-09-24 LAB — CBC WITH DIFFERENTIAL (CANCER CENTER ONLY)
Abs Immature Granulocytes: 0.01 10*3/uL (ref 0.00–0.07)
Basophils Absolute: 0.1 10*3/uL (ref 0.0–0.1)
Basophils Relative: 1 %
Eosinophils Absolute: 0.3 10*3/uL (ref 0.0–0.5)
Eosinophils Relative: 6 %
HCT: 35.7 % — ABNORMAL LOW (ref 39.0–52.0)
Hemoglobin: 11.6 g/dL — ABNORMAL LOW (ref 13.0–17.0)
Immature Granulocytes: 0 %
Lymphocytes Relative: 21 %
Lymphs Abs: 1.1 10*3/uL (ref 0.7–4.0)
MCH: 32.6 pg (ref 26.0–34.0)
MCHC: 32.5 g/dL (ref 30.0–36.0)
MCV: 100.3 fL — ABNORMAL HIGH (ref 80.0–100.0)
Monocytes Absolute: 0.5 10*3/uL (ref 0.1–1.0)
Monocytes Relative: 10 %
Neutro Abs: 3.3 10*3/uL (ref 1.7–7.7)
Neutrophils Relative %: 62 %
Platelet Count: 213 10*3/uL (ref 150–400)
RBC: 3.56 MIL/uL — ABNORMAL LOW (ref 4.22–5.81)
RDW: 12.7 % (ref 11.5–15.5)
WBC Count: 5.3 10*3/uL (ref 4.0–10.5)
nRBC: 0 % (ref 0.0–0.2)

## 2023-09-24 LAB — CMP (CANCER CENTER ONLY)
ALT: 9 U/L (ref 0–44)
AST: 13 U/L — ABNORMAL LOW (ref 15–41)
Albumin: 4.4 g/dL (ref 3.5–5.0)
Alkaline Phosphatase: 76 U/L (ref 38–126)
Anion gap: 7 (ref 5–15)
BUN: 27 mg/dL — ABNORMAL HIGH (ref 8–23)
CO2: 27 mmol/L (ref 22–32)
Calcium: 10.4 mg/dL — ABNORMAL HIGH (ref 8.9–10.3)
Chloride: 107 mmol/L (ref 98–111)
Creatinine: 1.35 mg/dL — ABNORMAL HIGH (ref 0.61–1.24)
GFR, Estimated: 58 mL/min — ABNORMAL LOW (ref >60)
Glucose, Bld: 86 mg/dL (ref 70–99)
Potassium: 3.8 mmol/L (ref 3.5–5.1)
Sodium: 141 mmol/L (ref 135–145)
Total Bilirubin: 0.4 mg/dL (ref <1.2)
Total Protein: 8.1 g/dL (ref 6.5–8.1)

## 2023-09-24 MED ORDER — GADOBUTROL 1 MMOL/ML IV SOLN
5.0000 mL | Freq: Once | INTRAVENOUS | Status: AC | PRN
  Administered 2023-09-24: 5 mL via INTRAVENOUS

## 2023-09-24 MED ORDER — ONDANSETRON 4 MG PO TBDP: 4.0000 mg | ORAL_TABLET | Freq: Three times a day (TID) | ORAL | 0 refills | Status: DC | PRN

## 2023-09-24 MED ORDER — SODIUM CHLORIDE 0.9 % IV SOLN
1500.0000 mg | Freq: Once | INTRAVENOUS | Status: AC
  Administered 2023-09-24: 1500 mg via INTRAVENOUS
  Filled 2023-09-24: qty 30

## 2023-09-24 MED ORDER — METOCLOPRAMIDE HCL 10 MG PO TABS: 10.0000 mg | ORAL_TABLET | Freq: Four times a day (QID) | ORAL | 1 refills | Status: DC

## 2023-09-24 MED ORDER — OXYCODONE HCL 5 MG PO TABS: 5.0000 mg | ORAL_TABLET | Freq: Four times a day (QID) | ORAL | 0 refills | Status: DC | PRN

## 2023-09-24 MED ORDER — PANTOPRAZOLE SODIUM 40 MG PO TBEC: 40.0000 mg | DELAYED_RELEASE_TABLET | Freq: Every day | ORAL | 3 refills | Status: DC

## 2023-09-24 MED ORDER — SODIUM CHLORIDE 0.9 % IV SOLN: Freq: Once | INTRAVENOUS | Status: AC

## 2023-09-24 NOTE — Patient Instructions (Signed)
[facility] - A DEPT OF [facility]  Discharge Instructions: Thank you for choosing [facility] to provide your oncology and hematology care.   If you have a lab appointment with the Cancer Center, please go directly to the Cancer Center and check in at the registration area.   Wear comfortable clothing and clothing appropriate for easy access to any Portacath or PICC line.   We strive to give you quality time with your provider. You may need to reschedule your appointment if you arrive late (15 or more minutes).  Arriving late affects you and other patients whose appointments are after yours.  Also, if you miss three or more appointments without notifying the office, you may be dismissed from the clinic at the provider's discretion.      For prescription refill requests, have your pharmacy contact our office and allow 72 hours for refills to be completed.    Today you received the following chemotherapy and/or immunotherapy agents: durvalumab      To help prevent nausea and vomiting after your treatment, we encourage you to take your nausea medication as directed.  BELOW ARE SYMPTOMS THAT SHOULD BE REPORTED IMMEDIATELY: *FEVER GREATER THAN 100.4 F (38 C) OR HIGHER *CHILLS OR SWEATING *NAUSEA AND VOMITING THAT IS NOT CONTROLLED WITH YOUR NAUSEA MEDICATION *UNUSUAL SHORTNESS OF BREATH *UNUSUAL BRUISING OR BLEEDING *URINARY PROBLEMS (pain or burning when urinating, or frequent urination) *BOWEL PROBLEMS (unusual diarrhea, constipation, pain near the anus) TENDERNESS IN MOUTH AND THROAT WITH OR WITHOUT PRESENCE OF ULCERS (sore throat, sores in mouth, or a toothache) UNUSUAL RASH, SWELLING OR PAIN  UNUSUAL VAGINAL DISCHARGE OR ITCHING   Items with * indicate a potential emergency and should be followed up as soon as possible or go to the Emergency Department if any problems should occur.  Please show the CHEMOTHERAPY ALERT CARD or  IMMUNOTHERAPY ALERT CARD at check-in to the Emergency Department and triage nurse.  Should you have questions after your visit or need to cancel or reschedule your appointment, please contact [facility] - A DEPT OF [facility]  Dept: [phone]  and follow the prompts.  Office hours are 8:00 a.m. to 4:30 p.m. Monday - Friday. Please note that voicemails left after 4:00 p.m. may not be returned until the following business day.  We are closed weekends and major holidays. You have access to a nurse at all times for urgent questions. Please call the main number to the clinic Dept: [phone] and follow the prompts.   For any non-urgent questions, you may also contact your provider using MyChart. We now offer e-Visits for anyone 60 and older to request care online for non-urgent symptoms. For details visit [email or web address].   Also download the MyChart app! Go to the app store, search "MyChart", open the app, select [place], and log in with your MyChart username and password.

## 2023-09-24 NOTE — Progress Notes (Signed)
[facility]   Telephone:(336) [phone] Fax:(336) [phone]   Clinic Follow up Note   Patient Care Team: [name], Corey Armstrong as PCP - General (Internal Medicine) Corey Mood, Corey Armstrong as Attending Physician (Hematology and Oncology) [name], [name], NP as Nurse Practitioner [facility] and Palliative Medicine)  Date of Service:  09/24/2023  CHIEF COMPLAINT: f/u of metastatic hepatocellular carcinoma  CURRENT THERAPY:  Durvalumab every 4 weeks  Oncology History   Hepatocellular carcinoma (HCC) diagnosed 11/2019 by MRI for f/u of cirrhosis. S/p microwave ablation on 03/01/20. -MRI 06/01/22 showed suspicion for local recurrence and metastasis at L5. Biopsy of L5 confirmed metastatic HCC. S/p repeat microwave ablation on 07/17/22. -started lenvima 08/16/22 -staging CT CAP and bone scan on 09/19/22 showed: interval enlargement of right hepatic lobe HCC; new retropulsed bony fragment at L5; no other new lesions. His previous CT was in 05/2022 and he did not start Lenvima until 07/2022, so it's hard to evaluate if he is responding to [facility] or not, the progression is likely the nature course of his [facility]  -Restaging CT scan from January 02, 2023 showed slightly improved previously treated liver lesion, and additional area of concern in the liver which are difficult to compare with previous scan due to different timing of contrast.  No other evidence of disease progression.  Plan to obtain MRI as next scan.  I personally reviewed the CT scan images and discussed the findings with patient -Will continue Lenvima, he is tolerating well overall. -Due to his weight being above 60 kg now, I increased his Lenvima to 12 mg daily in Feb 2024 -repeated liver MRI on 04/23/2023 showed stable treated liver lesion, L5 bone mets has slightly increased in size.  Patient developed worsening low back pain in July 2024, repeated lumbar MRI showed significant disease progression in the L5 bone lesion.  I  personally reviewed the scan images and discussed the findings with patient. -I recommend palliative radiation to his L5 lesion for pain control -I recommend changing systemic treatment to immunotherapy, I discussed option of atezolizumab and bevacizumab, durvalumab and Imjudo, navel and ipi etc. since he previously progressed on oral TKI, I recommend him to proceed with durvalumab and Imjudo. He started on 06/30/23 -the goal of therapy is curative/palliative to prolong life and prevent/improve cancer related symptoms.     Assessment and Plan    Liver Cancer Follow-up for liver cancer. Preliminary imaging review suggests stable disease with minimal uptake on previously treated lesions. Concerns about potential nerve compression from previous bone metastasis or arthritis. Duvelumab unlikely to cause localized nerve damage. MRI preferred over CT for assessing nerve compression. - Proceed with current treatment - Await formal imaging report - Order lumbar MRI with and without contrast - Refer to orthopedic specialist at [facility] - Coordinate MRI appointment  Right Leg Weakness and Numbness Right leg weakness and numbness, possibly related to nerve compression from lumbar spine metastasis or local orthopedic issues. No hip or back pain. Referral to orthopedic specialist discussed. - Refer to orthopedic specialist at [facility] per pt's wife's request  - Order lumbar MRI with and without contrast to be done in next few days   Nausea and Vomiting Persistent nausea and vomiting postprandially, leading to weight loss. Previous treatments with Compazine and Zofran ineffective. Considering delayed gastric emptying. Discussed sublingual Zofran and Reglan to improve gastric motility and reduce nausea. Advised small, frequent meals and drinking liquids with meals. - Prescribe sublingual Zofran (20 tablets) - Prescribe Reglan  before meals - Advise small, frequent meals and  drinking liquids with meals - Consider Compazine or Phenergan if needed  Constipation Reports hard bowel movements. Currently using Miralax. - Continue Miralax  General Health Maintenance Slightly elevated BUN and creatinine, elevated calcium levels. Encouraged increased water intake. - Monitor calcium levels - Encourage increased water intake - Continue vitamin D and vitamin C supplementation  Plan -CT and bone scan reviewed, stable disease -I ordered lumbar MRI with and without contrast to be done in the next few days, is scheduled for later today -Will proceed durvalumab today and continue every 4 weeks -I called in oral Benadryl ODT and Reglan for his nausea -He will see Corey Armstrong for his symptom management          SUMMARY OF ONCOLOGIC HISTORY: Oncology History  Hepatocellular carcinoma (HCC)  12/08/2019 Imaging   MR ABDOMEN WWO CONTRAST   IMPRESSION: Two small masses in the right hepatic lobe which have characteristics diagnostic for hepatocellular carcinoma in the setting of cirrhosis. (LI-RADS Category 5: Definitely HCC)   No evidence of abdominal metastatic disease or other significant abnormality.   03/01/2020 Initial Diagnosis   Hepatocellular carcinoma (HCC)   03/01/2020 Procedure   CT GUIDE TISSUE ABLATION     IMPRESSION: Successful percutaneous thermal ablation.   Signed,   Corey Big, Corey Armstrong, Corey Armstrong   06/02/2020 Imaging   MR ABDOMEN WWO CONTRAST   IMPRESSION: 1. There has been interval ablation at two adjacent sites of the subcapsular right lobe of the liver, hepatic segment VI. There is no residual contrast enhancement at these adjacent sites. LI-RADS category 5 TR, nonviable. 2. There are scattered, subcentimeter foci of arterial hyperenhancement of the anterior right lobe of the liver, for example adjacent to the ablation site measuring 3 mm and in the medial anterior right lobe of the liver measuring 6 mm. This larger focus is unchanged  compared to prior. Others were not previously seen. These findings are nonspecific and consistent with LI-RADS category 3. Attention on follow-up.   09/02/2020 Imaging   MR ABDOMEN WWO CONTRAST     IMPRESSION: 1. Post radiofrequency ablation along the margin of the RIGHT hemi liver. Heterogeneous appearance of this area on both T1 and T2. No sign of residual enhancement. LR TR nonviable. 2. Very subtle focus of restricted diffusion in the cephalad lateral segment LEFT hepatic lobe measuring 7 mm, corresponding to low signal on image 27 of series 21. Arterial enhancement is not seen in this area and this areas not seen on prior imaging. Not clear whether this represents a new lesion as this area is obscured largely by artifact on most sequences on today's study and on previous imaging evaluations. LR category 3, consider a 3 to six-month follow-up with abdominal MRI. 3. Lesions of concern scattered about the RIGHT hepatic lobe may represent perfusional anomalies as they were not seen on today's study. 4. Stable mild dilation of the biliary tree.   11/27/2020 Imaging   MR ABDOMEN WWO CONTRAST   IMPRESSION: Minimal decrease in size of ablation defect in the peripheral anterior right hepatic lobe. No evidence of locally recurrent hepatocellular carcinoma or other suspicious liver lesions.   No evidence of metastatic disease or other acute findings.   04/03/2021 Imaging   MR ABDOMEN WWO CONTRAST   IMPRESSION: Stable ablation defect in the lateral right hepatic lobe, without evidence of recurrent tumor at this site.   5 mm hypovascular lesion in segment 5 of the right lobe was not definitely  seen on previous study, but is too small to characterize. This shows no arterial phase hyperenhancement. Recommend continued follow-up by MRI in 6 months.   No evidence of abdominal metastatic disease.        09/03/2021 Imaging   MR ABDOMEN WWO CONTRAST   IMPRESSION: 1.  Side-by-side ablation sites in the right hepatic lobe in segment 6. The more anterior site remains largely cystic in appearance and no findings suspicious recurrent tumor in this area. There is however abnormal contrast enhancement around the larger more posterior and lateral ablation site worrisome for recurrent tumor. 2. Several small foci of early arterial phase enhancement in the liver. The peripheral lesions are likely vascular shunts. The more central lesions are likely dysplastic nodules. Recommend continued surveillance. 3. Stable bilateral renal cysts.   12/07/2021 Imaging   MR ABDOMEN WWO CONTRAST  IMPRESSION: 1. Ablation defects in the right lobe of the liver, similar to the prior study, without definitive evidence to suggest local recurrence of disease or metastatic disease. 2. Multiple tiny arterial phase areas of nodular hyperenhancement scattered throughout the liver, predominantly in the right lobe of the liver, without perceptible signal abnormality or diffusion restriction on other pulse sequences, nonspecific, and likely small benign areas of vascular shunting. Continued attention on follow-up imaging is recommended. 3. Multiple Bosniak class 1 and Bosniak class 2 cysts in the kidneys bilaterally, similar to the prior study, as above. 4. Aortic atherosclerosis.     06/01/2022 Imaging   MR ABDOMEN WWO CONTRAST   IMPRESSION: 1. LR TR equivocal but suspicious findings with respect to previously treated disease in the RIGHT hepatic lobe given nodular enhancement adjacent and anterior superior to the ablation zone as described. 2. Given lack of late arterial phase due to bolus timing a second area of abnormality within the liver is suspicious for additional site of hepatocellular carcinoma showing washout appearance and capsule appearance on current imaging, cardiac pulsation limiting assessment in the area of concern. 3. Suspect bony metastatic disease in the  lower lumbar spine at L5 level. Dedicated spinal imaging may be helpful for further evaluation.     06/18/2022 Imaging   MR Lumbar Spine W Wo Contrast   IMPRESSION: L5 vertebral body lesion is most consistent with a metastasis. There is probable trace ventral epidural extension   07/17/2022 Procedure   CT GUIDE TISSUE ABLATION   IMPRESSION: Successful CT-guided microwave ablation of recurrent hepatocellular carcinoma.   07/17/2022 Procedure   CT BONE TROCAR/NEEDLE BIOPSY SUPERFICIAL   IMPRESSION: Successful CT-guided core biopsy of L5 bone lesion.      07/17/2022 Pathology Results   SURGICAL PATHOLOGY  CASE: [id number]  PATIENT: Corey Armstrong  Surgical Pathology Report   SURGICAL PATHOLOGY  CASE: [id number]  PATIENT: Corey Armstrong  Surgical Pathology Report      Clinical History: Hx of cirrhosis and small HCC with unexpected  suspicious lesion at L5. Rare HCC met to bone vs atypical hemangioma or  met from new primary. (crm)      FINAL MICROSCOPIC DIAGNOSIS:   A. 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 No acute findings within the chest. No suspicious findings for metastatic disease within the chest. 2. Unchanged 3 mm nodule within the medial right upper lobe. No new or suspicious lung nodules. 3. Suboptimal visualization of the known liver lesions, reflecting lack of  IV contrast material. Similar appearance of ablation defect within the lateral right lobe of liver. 4. Left renal calculi. 5. Coronary artery calcifications. 6.  Aortic Atherosclerosis   06/25/2023 Imaging   NM Bone Scan whole body  IMPRESSION: 1. Known L5 metastasis demonstrates low level uptake, mildly increased from previous bone scan. 2. No new lesions demonstrated.   06/30/2023 -  Chemotherapy    Patient is on Treatment Plan : Hepatocellular Carcinoma Tremelimumab-actl C1 D1 + Durvalumab q28d       Imaging        Discussed the use of AI scribe software for clinical note transcription with the patient, who gave verbal consent to proceed.  History of Present Illness   A 67 year old patient with a history of liver cancer, currently on immunotherapy, presents with new onset right leg weakness and numbness. The patient reports that his leg feels heavy and he has difficulty lifting it, leading to a dragging gait. He denies any pain in the hip or back area. The weakness has been present for over a month and is associated with numbness. The patient's wife confirms that he has been in significant pain.  In addition to the leg weakness, the patient has been experiencing ongoing nausea and vomiting, particularly after eating. This has resulted in weight loss. The patient reports that he has tried Compazine and Zofran for the nausea, but these have not been effective. He also reports that his bowel movements are a little hard, but regular.  The patient is currently on disability due to his health issues, but is hoping to return to work in January. However, he acknowledges that his current physical state may not allow for this.         All other systems were reviewed with the patient and are negative.  MEDICAL HISTORY:  Past Medical History:  Diagnosis Date   Headache    migraines   Hepatitis    HX OF hEP c ?    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    SURGICAL HISTORY: Past Surgical History:  Procedure Laterality Date   IR RADIOLOGIST EVAL & MGMT  01/25/2020   IR RADIOLOGIST EVAL & MGMT  03/21/2020   IR RADIOLOGIST EVAL & MGMT  06/07/2020   IR RADIOLOGIST EVAL & MGMT  09/05/2020   IR RADIOLOGIST EVAL & MGMT  12/05/2020   IR RADIOLOGIST EVAL & MGMT  05/09/2021   IR RADIOLOGIST EVAL & MGMT  09/06/2021   IR RADIOLOGIST EVAL & MGMT  12/12/2021   IR  RADIOLOGIST EVAL & MGMT  06/25/2022   IR RADIOLOGIST EVAL & MGMT  07/24/2022   RADIOLOGY WITH ANESTHESIA N/A 03/01/2020   Procedure: CT WITH ANESTHESIA  THERMAL MICROWAVE  ABLATIION;  Surgeon: Corey Moan, Corey Armstrong;  Location: [facility];  Service: Anesthesiology;  Laterality: N/A;   RADIOLOGY WITH ANESTHESIA N/A 07/17/2022   Procedure: CT MICROWAVE ABLATION;  Surgeon: Corey Big, Corey Armstrong;  Location: [facility];  Service: Radiology;  Laterality: N/A;    I have reviewed the social history and family history with the patient and they are unchanged from previous note.  ALLERGIES:  has No Known Allergies.  MEDICATIONS:  Current Outpatient Medications  Medication Sig Dispense Refill   metoCLOPramide (REGLAN) 10 MG tablet Take 1 tablet (10 mg total) by mouth 4 (four) times daily. 30 tablet 1   ondansetron (ZOFRAN-ODT) 4 MG disintegrating tablet Take 1 tablet (4 mg total) by mouth every 8 (eight)  hours as needed for nausea or vomiting. 20 tablet 0   gabapentin (NEURONTIN) 300 MG capsule Take 2 capsules (600 mg) twice daily and 3 capsules (900 mg) at bedtime.     HYDROcodone Bitartrate ER 20 MG CP12 Take 20 mg by mouth every 12 (twelve) hours. 60 capsule 0   ibuprofen (ADVIL) 100 MG tablet Take 100 mg by mouth every 6 (six) hours as needed for fever.     lidocaine (LIDODERM) 5 % Place 1 patch onto the skin daily. Remove & Discard patch within 12 hours or as directed by Corey Armstrong 30 patch 0   losartan-hydrochlorothiazide (HYZAAR) 100-25 MG tablet TAKE 1 TABLET BY MOUTH EVERY DAY 90 tablet 3   ondansetron (ZOFRAN) 8 MG tablet Take 1 tablet (8 mg total) by mouth every 8 (eight) hours as needed for nausea or vomiting. 30 tablet 3   oxyCODONE (OXY IR/ROXICODONE) 5 MG immediate release tablet Take 1-2 tablets (5-10 mg total) by mouth every 6 (six) hours as needed for severe pain (pain score 7-10). 45 tablet 0   pantoprazole (PROTONIX) 40 MG tablet Take 1 tablet (40 mg total) by mouth daily. 30 tablet 3   prochlorperazine  (COMPAZINE) 10 MG tablet Take 1 tablet (10 mg total) by mouth every 6 (six) hours as needed for nausea or vomiting. 30 tablet 0   No current facility-administered medications for this visit.    PHYSICAL EXAMINATION: ECOG PERFORMANCE STATUS: 2 - Symptomatic, <50% confined to bed  Vitals:   09/24/23 0951  BP: (!) 107/55  Pulse: 71  Resp: 18  Temp: 98.5 F (36.9 C)  SpO2: 100%   Wt Readings from Last 3 Encounters:  09/24/23 128 lb 11.2 oz (58.4 kg)  08/27/23 132 lb 9.6 oz (60.1 kg)  07/31/23 135 lb 1.6 oz (61.3 kg)     GENERAL:alert, no distress and comfortable SKIN: skin color, texture, turgor are normal, no rashes or significant lesions EYES: normal, Conjunctiva are pink and non-injected, sclera clear K: supple, thyroid normal size, non-tender, without nodularity LYMPH:  no palpable lymphadenopathy in the cervical, axillary  LUNGS: clear to auscultation and percussion with normal breathing effort HEART: regular rate & rhythm and no murmurs and no lower extremity edema ABDOMEN:abdomen soft, non-tender and normal bowel sounds Musculoskeletal:no cyanosis of digits and no clubbing  NEURO: alert & oriented x 3 with fluent speech, no focal motor/sensory deficits   LABORATORY DATA:  I have reviewed the data as listed    Latest Ref Rng & Units 09/24/2023    9:31 AM 08/27/2023    9:28 AM 07/30/2023    9:08 AM  CBC  WBC 4.0 - 10.5 K/uL 5.3  6.3  6.2   Hemoglobin 13.0 - 17.0 g/dL 19.1  47.8  29.5   Hematocrit 39.0 - 52.0 % 35.7  35.3  35.6   Platelets 150 - 400 K/uL 213  188  213         Latest Ref Rng & Units 09/24/2023    9:31 AM 08/27/2023    9:28 AM 07/30/2023    9:08 AM  CMP  Glucose 70 - 99 mg/dL 86  91  621   BUN 8 - 23 mg/dL 27  31  37   Creatinine 0.61 - 1.24 mg/dL 3.08  6.57  8.46   Sodium 135 - 145 mmol/L 141  144  142   Potassium 3.5 - 5.1 mmol/L 3.8  3.9  3.6   Chloride 98 - 111 mmol/L 107  111  114  CO2 22 - 32 mmol/L 27  22  21    Calcium 8.9 - 10.3  mg/dL 78.2  9.7  9.9   Total Protein 6.5 - 8.1 g/dL 8.1  8.2  7.9   Total Bilirubin <1.2 mg/dL 0.4  0.8  0.2   Alkaline Phos 38 - 126 U/L 76  62  74   AST 15 - 41 U/L 13  22  12    ALT 0 - 44 U/L 9  26  10        RADIOGRAPHIC STUDIES: I have personally reviewed the radiological images as listed and agreed with the findings in the report. No results found.    Orders Placed This Encounter  Procedures   MR Lumbar Spine W Wo Contrast    Standing Status:   Future    Number of Occurrences:   1    Standing Expiration Date:   09/23/2024    Order Specific Question:   If indicated for the ordered procedure, I authorize the administration of contrast media per Radiology protocol    Answer:   Yes    Order Specific Question:   What is the patient's sedation requirement?    Answer:   No Sedation    Order Specific Question:   Does the patient have a pacemaker or implanted devices?    Answer:   No    Order Specific Question:   Use SRS Protocol?    Answer:   No    Order Specific Question:   Preferred imaging location?    Answer:   [facility] (table limit - 550 lbs)   CBC with Differential (Cancer Center Only)    Standing Status:   Future    Standing Expiration Date:   10/21/2024   CMP (Cancer Center only)    Standing Status:   Future    Standing Expiration Date:   10/21/2024   CBC with Differential (Cancer Center Only)    Standing Status:   Future    Standing Expiration Date:   11/18/2024   CMP (Cancer Center only)    Standing Status:   Future    Standing Expiration Date:   11/18/2024   T4    Standing Status:   Future    Standing Expiration Date:   11/18/2024   TSH    Standing Status:   Future    Standing Expiration Date:   11/18/2024   AMB referral to sports medicine    Referral Priority:   Routine    Referral Type:   Consultation    Number of Visits Requested:   1   All questions were answered. The patient knows to call the clinic with any problems, questions or concerns. No  barriers to learning was detected. The total time spent in the appointment was 40 minutes.     Corey Mood, Corey Armstrong 09/24/2023

## 2023-10-01 ENCOUNTER — Inpatient Hospital Stay: Payer: Commercial Managed Care - PPO | Admitting: Licensed Clinical Social Worker

## 2023-10-01 ENCOUNTER — Other Ambulatory Visit: Payer: Self-pay | Admitting: Nurse Practitioner

## 2023-10-01 ENCOUNTER — Other Ambulatory Visit: Payer: Self-pay

## 2023-10-01 DIAGNOSIS — C7951 Secondary malignant neoplasm of bone: Secondary | ICD-10-CM

## 2023-10-01 DIAGNOSIS — M792 Neuralgia and neuritis, unspecified: Secondary | ICD-10-CM

## 2023-10-01 DIAGNOSIS — C220 Liver cell carcinoma: Secondary | ICD-10-CM

## 2023-10-01 MED ORDER — GABAPENTIN 300 MG PO CAPS: ORAL_CAPSULE | ORAL | 1 refills | Status: DC

## 2023-10-01 NOTE — Progress Notes (Unsigned)
    Aleen Sells D.[facility] [address] [place] [address] Phone: [phone]   Assessment and Plan:     There are no diagnoses linked to this encounter.  ***   Pertinent previous records reviewed include ***    Follow Up: ***     Subjective:   I, Fermina Mishkin, am serving as a Neurosurgeon for Doctor Richardean Sale  Chief Complaint: right leg pain   HPI:   10/02/2023 Patient is a 67 year old male with concerns of right leg pain. Patient states  Relevant Historical Information: ***  Additional pertinent review of systems negative.   Current Outpatient Medications:    gabapentin (NEURONTIN) 300 MG capsule, Take 2 capsules (600 mg) twice daily and 3 capsules (900 mg) at bedtime., Disp: , Rfl:    HYDROcodone Bitartrate ER 20 MG CP12, Take 20 mg by mouth every 12 (twelve) hours., Disp: 60 capsule, Rfl: 0   ibuprofen (ADVIL) 100 MG tablet, Take 100 mg by mouth every 6 (six) hours as needed for fever., Disp: , Rfl:    lidocaine (LIDODERM) 5 %, Place 1 patch onto the skin daily. Remove & Discard patch within 12 hours or as directed by MD, Disp: 30 patch, Rfl: 0   losartan-hydrochlorothiazide (HYZAAR) 100-25 MG tablet, TAKE 1 TABLET BY MOUTH EVERY DAY, Disp: 90 tablet, Rfl: 3   metoCLOPramide (REGLAN) 10 MG tablet, Take 1 tablet (10 mg total) by mouth 4 (four) times daily., Disp: 30 tablet, Rfl: 1   ondansetron (ZOFRAN) 8 MG tablet, Take 1 tablet (8 mg total) by mouth every 8 (eight) hours as needed for nausea or vomiting., Disp: 30 tablet, Rfl: 3   ondansetron (ZOFRAN-ODT) 4 MG disintegrating tablet, Take 1 tablet (4 mg total) by mouth every 8 (eight) hours as needed for nausea or vomiting., Disp: 20 tablet, Rfl: 0   oxyCODONE (OXY IR/ROXICODONE) 5 MG immediate release tablet, Take 1-2 tablets (5-10 mg total) by mouth every 6 (six) hours as needed for severe pain (pain score 7-10)., Disp: 45 tablet, Rfl: 0   pantoprazole (PROTONIX) 40 MG  tablet, Take 1 tablet (40 mg total) by mouth daily., Disp: 30 tablet, Rfl: 3   prochlorperazine (COMPAZINE) 10 MG tablet, Take 1 tablet (10 mg total) by mouth every 6 (six) hours as needed for nausea or vomiting., Disp: 30 tablet, Rfl: 0   Objective:     There were no vitals filed for this visit.    There is no height or weight on file to calculate BMI.    Physical Exam:    ***   Electronically signed by:  Aleen Sells D.[facility] 7:27 AM 10/01/23

## 2023-10-02 ENCOUNTER — Ambulatory Visit: Payer: Commercial Managed Care - PPO | Admitting: Sports Medicine

## 2023-10-02 ENCOUNTER — Encounter: Payer: Self-pay | Admitting: Hematology

## 2023-10-02 VITALS — BP 138/78 | HR 76 | Ht 71.0 in | Wt 131.0 lb

## 2023-10-02 DIAGNOSIS — C220 Liver cell carcinoma: Secondary | ICD-10-CM

## 2023-10-02 DIAGNOSIS — R29898 Other symptoms and signs involving the musculoskeletal system: Secondary | ICD-10-CM | POA: Diagnosis not present

## 2023-10-02 DIAGNOSIS — M5441 Lumbago with sciatica, right side: Secondary | ICD-10-CM

## 2023-10-02 DIAGNOSIS — G8929 Other chronic pain: Secondary | ICD-10-CM

## 2023-10-02 DIAGNOSIS — C7951 Secondary malignant neoplasm of bone: Secondary | ICD-10-CM | POA: Diagnosis not present

## 2023-10-02 NOTE — Progress Notes (Signed)
[facility] CSW Progress Note  Visual merchandiser  received a request to contact pt regarding disability.  CSW spoke w/ both pt and his wife over the phone regarding eligibility for social security disability.  CSW informed both that at age 67 pt is not eligible to apply for social security disability; however, he would be eligible to apply for early retirement and receive social security.  Pt and wife provided w/ contact details for [facility] to speak with someone regarding the steps for enrollment.    CSW to remain available as appropriate throughout duration of treatment to provide support.      Rachel Moulds, LCSW Clinical Social Worker [facility]

## 2023-10-02 NOTE — Patient Instructions (Signed)
Recommend going back to oncologist and letting them know radiation did not shrink the lesion. Ask if you should try more radiation or talk to a surgeon  Can call and let us know if you would like to try PT or epidural injection  Ankle HEP  As needed follow

## 2023-10-06 ENCOUNTER — Telehealth: Payer: Self-pay | Admitting: Hematology

## 2023-10-07 ENCOUNTER — Other Ambulatory Visit: Payer: Self-pay

## 2023-10-07 MED ORDER — METOCLOPRAMIDE HCL 10 MG PO TABS: 10.0000 mg | ORAL_TABLET | Freq: Four times a day (QID) | ORAL | 2 refills | Status: DC

## 2023-10-10 ENCOUNTER — Encounter: Payer: Commercial Managed Care - PPO | Admitting: Dietician

## 2023-10-12 ENCOUNTER — Other Ambulatory Visit: Payer: Self-pay | Admitting: Nurse Practitioner

## 2023-10-12 DIAGNOSIS — C7951 Secondary malignant neoplasm of bone: Secondary | ICD-10-CM

## 2023-10-12 DIAGNOSIS — Z515 Encounter for palliative care: Secondary | ICD-10-CM

## 2023-10-12 DIAGNOSIS — G893 Neoplasm related pain (acute) (chronic): Secondary | ICD-10-CM

## 2023-10-12 DIAGNOSIS — C220 Liver cell carcinoma: Secondary | ICD-10-CM

## 2023-10-13 ENCOUNTER — Other Ambulatory Visit (HOSPITAL_COMMUNITY): Payer: Self-pay

## 2023-10-13 MED ORDER — HYDROCODONE BITARTRATE ER 20 MG PO CP12
1.0000 | ORAL_CAPSULE | Freq: Two times a day (BID) | ORAL | 0 refills | Status: DC
  Filled 2023-10-14: qty 60, 30d supply, fill #0

## 2023-10-14 ENCOUNTER — Other Ambulatory Visit (HOSPITAL_COMMUNITY): Payer: Self-pay

## 2023-10-14 ENCOUNTER — Encounter: Payer: Self-pay | Admitting: Hematology

## 2023-10-15 ENCOUNTER — Other Ambulatory Visit (HOSPITAL_COMMUNITY): Payer: Self-pay

## 2023-10-21 NOTE — Assessment & Plan Note (Signed)
diagnosed 11/2019 by MRI for f/u of cirrhosis. S/p microwave ablation on 03/01/20. -MRI 06/01/22 showed suspicion for local recurrence and metastasis at L5. Biopsy of L5 confirmed metastatic HCC. S/p repeat microwave ablation on 07/17/22. -started lenvima 08/16/22 -staging CT CAP and bone scan on 09/19/22 showed: interval enlargement of right hepatic lobe HCC; new retropulsed bony fragment at L5; no other new lesions. His previous CT was in 05/2022 and he did not start Lenvima until 07/2022, so it's hard to evaluate if he is responding to [facility] or not, the progression is likely the nature course of his [facility]  -Restaging CT scan from January 02, 2023 showed slightly improved previously treated liver lesion, and additional area of concern in the liver which are difficult to compare with previous scan due to different timing of contrast.  No other evidence of disease progression.  Plan to obtain MRI as next scan.  I personally reviewed the CT scan images and discussed the findings with patient -Will continue Lenvima, he is tolerating well overall. -Due to his weight being above 60 kg now, I increased his Lenvima to 12 mg daily in Feb 2024 -repeated liver MRI on 04/23/2023 showed stable treated liver lesion, L5 bone mets has slightly increased in size.  Patient developed worsening low back pain in July 2024, repeated lumbar MRI showed significant disease progression in the L5 bone lesion.  I personally reviewed the scan images and discussed the findings with patient. -I recommend palliative radiation to his L5 lesion for pain control -I recommend changing systemic treatment to immunotherapy, I discussed option of atezolizumab and bevacizumab, durvalumab and Imjudo, navel and ipi etc. since he previously progressed on oral TKI, I recommend him to proceed with durvalumab and Imjudo. He started on 06/30/23 -the goal of therapy is curative/palliative to prolong life and prevent/improve cancer related symptoms.

## 2023-10-22 ENCOUNTER — Inpatient Hospital Stay: Payer: Commercial Managed Care - PPO

## 2023-10-22 ENCOUNTER — Inpatient Hospital Stay: Payer: Commercial Managed Care - PPO | Attending: Hematology

## 2023-10-22 ENCOUNTER — Inpatient Hospital Stay (HOSPITAL_BASED_OUTPATIENT_CLINIC_OR_DEPARTMENT_OTHER): Payer: Commercial Managed Care - PPO | Admitting: Nurse Practitioner

## 2023-10-22 ENCOUNTER — Encounter: Payer: Self-pay | Admitting: Nurse Practitioner

## 2023-10-22 ENCOUNTER — Encounter: Payer: Self-pay | Admitting: Hematology

## 2023-10-22 ENCOUNTER — Inpatient Hospital Stay (HOSPITAL_BASED_OUTPATIENT_CLINIC_OR_DEPARTMENT_OTHER): Payer: Commercial Managed Care - PPO | Admitting: Hematology

## 2023-10-22 VITALS — BP 143/62 | HR 77 | Temp 97.3°F | Resp 16 | Wt 127.3 lb

## 2023-10-22 DIAGNOSIS — I10 Essential (primary) hypertension: Secondary | ICD-10-CM | POA: Insufficient documentation

## 2023-10-22 DIAGNOSIS — R530 Neoplastic (malignant) related fatigue: Secondary | ICD-10-CM

## 2023-10-22 DIAGNOSIS — Z515 Encounter for palliative care: Secondary | ICD-10-CM | POA: Diagnosis not present

## 2023-10-22 DIAGNOSIS — C7951 Secondary malignant neoplasm of bone: Secondary | ICD-10-CM | POA: Diagnosis not present

## 2023-10-22 DIAGNOSIS — M79606 Pain in leg, unspecified: Secondary | ICD-10-CM | POA: Diagnosis not present

## 2023-10-22 DIAGNOSIS — Z5112 Encounter for antineoplastic immunotherapy: Secondary | ICD-10-CM | POA: Diagnosis not present

## 2023-10-22 DIAGNOSIS — G893 Neoplasm related pain (acute) (chronic): Secondary | ICD-10-CM

## 2023-10-22 DIAGNOSIS — R011 Cardiac murmur, unspecified: Secondary | ICD-10-CM | POA: Diagnosis not present

## 2023-10-22 DIAGNOSIS — Z7722 Contact with and (suspected) exposure to environmental tobacco smoke (acute) (chronic): Secondary | ICD-10-CM | POA: Diagnosis not present

## 2023-10-22 DIAGNOSIS — R110 Nausea: Secondary | ICD-10-CM

## 2023-10-22 DIAGNOSIS — Z87891 Personal history of nicotine dependence: Secondary | ICD-10-CM | POA: Insufficient documentation

## 2023-10-22 DIAGNOSIS — I700 Atherosclerosis of aorta: Secondary | ICD-10-CM | POA: Insufficient documentation

## 2023-10-22 DIAGNOSIS — K219 Gastro-esophageal reflux disease without esophagitis: Secondary | ICD-10-CM | POA: Insufficient documentation

## 2023-10-22 DIAGNOSIS — Z79899 Other long term (current) drug therapy: Secondary | ICD-10-CM | POA: Diagnosis not present

## 2023-10-22 DIAGNOSIS — C220 Liver cell carcinoma: Secondary | ICD-10-CM | POA: Diagnosis not present

## 2023-10-22 DIAGNOSIS — K5900 Constipation, unspecified: Secondary | ICD-10-CM | POA: Diagnosis not present

## 2023-10-22 DIAGNOSIS — R634 Abnormal weight loss: Secondary | ICD-10-CM | POA: Insufficient documentation

## 2023-10-22 DIAGNOSIS — R262 Difficulty in walking, not elsewhere classified: Secondary | ICD-10-CM | POA: Insufficient documentation

## 2023-10-22 LAB — CMP (CANCER CENTER ONLY)
ALT: 7 U/L (ref 0–44)
AST: 13 U/L — ABNORMAL LOW (ref 15–41)
Albumin: 4.2 g/dL (ref 3.5–5.0)
Alkaline Phosphatase: 58 U/L (ref 38–126)
Anion gap: 6 (ref 5–15)
BUN: 23 mg/dL (ref 8–23)
CO2: 26 mmol/L (ref 22–32)
Calcium: 10.1 mg/dL (ref 8.9–10.3)
Chloride: 112 mmol/L — ABNORMAL HIGH (ref 98–111)
Creatinine: 1.22 mg/dL (ref 0.61–1.24)
GFR, Estimated: >60 mL/min (ref >60)
Glucose, Bld: 80 mg/dL (ref 70–99)
Potassium: 3.6 mmol/L (ref 3.5–5.1)
Sodium: 144 mmol/L (ref 135–145)
Total Bilirubin: 0.3 mg/dL (ref <1.2)
Total Protein: 7.6 g/dL (ref 6.5–8.1)

## 2023-10-22 LAB — CBC WITH DIFFERENTIAL (CANCER CENTER ONLY)
Abs Immature Granulocytes: 0.01 10*3/uL (ref 0.00–0.07)
Basophils Absolute: 0.1 10*3/uL (ref 0.0–0.1)
Basophils Relative: 1 %
Eosinophils Absolute: 0.2 10*3/uL (ref 0.0–0.5)
Eosinophils Relative: 5 %
HCT: 36.6 % — ABNORMAL LOW (ref 39.0–52.0)
Hemoglobin: 12.1 g/dL — ABNORMAL LOW (ref 13.0–17.0)
Immature Granulocytes: 0 %
Lymphocytes Relative: 28 %
Lymphs Abs: 1.4 10*3/uL (ref 0.7–4.0)
MCH: 33.5 pg (ref 26.0–34.0)
MCHC: 33.1 g/dL (ref 30.0–36.0)
MCV: 101.4 fL — ABNORMAL HIGH (ref 80.0–100.0)
Monocytes Absolute: 0.5 10*3/uL (ref 0.1–1.0)
Monocytes Relative: 10 %
Neutro Abs: 2.7 10*3/uL (ref 1.7–7.7)
Neutrophils Relative %: 56 %
Platelet Count: 181 10*3/uL (ref 150–400)
RBC: 3.61 MIL/uL — ABNORMAL LOW (ref 4.22–5.81)
RDW: 12.2 % (ref 11.5–15.5)
WBC Count: 4.9 10*3/uL (ref 4.0–10.5)
nRBC: 0 % (ref 0.0–0.2)

## 2023-10-22 MED ORDER — SODIUM CHLORIDE 0.9 % IV SOLN: Freq: Once | INTRAVENOUS | Status: AC

## 2023-10-22 MED ORDER — OXYCODONE HCL 5 MG PO TABS: 5.0000 mg | ORAL_TABLET | Freq: Four times a day (QID) | ORAL | 0 refills | Status: DC | PRN

## 2023-10-22 MED ORDER — ONDANSETRON 4 MG PO TBDP: 4.0000 mg | ORAL_TABLET | Freq: Three times a day (TID) | ORAL | 0 refills | Status: DC | PRN

## 2023-10-22 MED ORDER — PROCHLORPERAZINE MALEATE 10 MG PO TABS: 10.0000 mg | ORAL_TABLET | Freq: Four times a day (QID) | ORAL | 0 refills | Status: DC | PRN

## 2023-10-22 MED ORDER — SODIUM CHLORIDE 0.9 % IV SOLN
1500.0000 mg | Freq: Once | INTRAVENOUS | Status: AC
Start: 2023-10-22 — End: 2023-10-22
  Administered 2023-10-22: 1500 mg via INTRAVENOUS
  Filled 2023-10-22: qty 30

## 2023-10-22 NOTE — Patient Instructions (Signed)
[facility] - A DEPT OF [facility]  Discharge Instructions: Thank you for choosing [facility] to provide your oncology and hematology care.   If you have a lab appointment with the Cancer Center, please go directly to the Cancer Center and check in at the registration area.   Wear comfortable clothing and clothing appropriate for easy access to any Portacath or PICC line.   We strive to give you quality time with your provider. You may need to reschedule your appointment if you arrive late (15 or more minutes).  Arriving late affects you and other patients whose appointments are after yours.  Also, if you miss three or more appointments without notifying the office, you may be dismissed from the clinic at the provider's discretion.      For prescription refill requests, have your pharmacy contact our office and allow 72 hours for refills to be completed.    Today you received the following chemotherapy and/or immunotherapy agents imfinzi      To help prevent nausea and vomiting after your treatment, we encourage you to take your nausea medication as directed.  BELOW ARE SYMPTOMS THAT SHOULD BE REPORTED IMMEDIATELY: *FEVER GREATER THAN 100.4 F (38 C) OR HIGHER *CHILLS OR SWEATING *NAUSEA AND VOMITING THAT IS NOT CONTROLLED WITH YOUR NAUSEA MEDICATION *UNUSUAL SHORTNESS OF BREATH *UNUSUAL BRUISING OR BLEEDING *URINARY PROBLEMS (pain or burning when urinating, or frequent urination) *BOWEL PROBLEMS (unusual diarrhea, constipation, pain near the anus) TENDERNESS IN MOUTH AND THROAT WITH OR WITHOUT PRESENCE OF ULCERS (sore throat, sores in mouth, or a toothache) UNUSUAL RASH, SWELLING OR PAIN  UNUSUAL VAGINAL DISCHARGE OR ITCHING   Items with * indicate a potential emergency and should be followed up as soon as possible or go to the Emergency Department if any problems should occur.  Please show the CHEMOTHERAPY ALERT CARD or IMMUNOTHERAPY  ALERT CARD at check-in to the Emergency Department and triage nurse.  Should you have questions after your visit or need to cancel or reschedule your appointment, please contact [facility] - A DEPT OF [facility]  Dept: [phone]  and follow the prompts.  Office hours are 8:00 a.m. to 4:30 p.m. Monday - Friday. Please note that voicemails left after 4:00 p.m. may not be returned until the following business day.  We are closed weekends and major holidays. You have access to a nurse at all times for urgent questions. Please call the main number to the clinic Dept: [phone] and follow the prompts.   For any non-urgent questions, you may also contact your provider using MyChart. We now offer e-Visits for anyone 27 and older to request care online for non-urgent symptoms. For details visit [email or web address].   Also download the MyChart app! Go to the app store, search "MyChart", open the app, select [facility], and log in with your MyChart username and password.

## 2023-10-22 NOTE — Progress Notes (Signed)
Palliative Medicine [facility]  [phone] [phone]   Name: Corey Companion Sr. Date: 10/22/2023 MRN: [id number]  DOB: [birth year]  Patient Care Team: [name], MD as PCP - General (Internal Medicine) Malachy Mood, MD as Attending Physician (Hematology and Oncology) [name], [name], NP as Nurse Practitioner (Hospice and Palliative Medicine)   INTERVAL HISTORY: Jaimon Lofgreen Sr. is a 67 y.o. 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  Palliative ask to see for symptom and pain management and goals of care.   SOCIAL HISTORY:     reports that he has quit smoking. He has been exposed to tobacco smoke. He has never used smokeless tobacco. He reports that he does not currently use alcohol. He reports that he does not use drugs.  ADVANCE DIRECTIVES:  None on file  CODE STATUS: Full code  PAST MEDICAL HISTORY: Past Medical History:  Diagnosis Date   Headache    migraines   Hepatitis    HX OF hEP c ?    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  MEDICATIONS:  Current Outpatient Medications  Medication Sig Dispense Refill   gabapentin (NEURONTIN) 300 MG capsule Take 2 capsules (600 mg) in the morning, 2 capsules (600mg ) midday, 3 capsules (900 mg) at bedtime. 210 capsule 1   HYDROcodone Bitartrate ER 20 MG CP12 Take 1 capsule by mouth every 12 (twelve) hours. 60 capsule 0   ibuprofen (ADVIL) 100 MG tablet Take 100 mg by mouth every 6 (six) hours as needed for fever.     lidocaine (LIDODERM) 5 % Place 1 patch onto the skin daily. Remove & Discard patch within 12 hours or as directed by MD 30 patch 0   losartan-hydrochlorothiazide (HYZAAR) 100-25 MG tablet TAKE 1 TABLET BY MOUTH EVERY DAY 90 tablet 3   metoCLOPramide (REGLAN) 10 MG tablet Take 1 tablet (10 mg  total) by mouth 4 (four) times daily. 90 tablet 2   ondansetron (ZOFRAN) 8 MG tablet Take 1 tablet (8 mg total) by mouth every 8 (eight) hours as needed for nausea or vomiting. 30 tablet 3   ondansetron (ZOFRAN-ODT) 4 MG disintegrating tablet Take 1 tablet (4 mg total) by mouth every 8 (eight) hours as needed for nausea or vomiting. 20 tablet 0   oxyCODONE (OXY IR/ROXICODONE) 5 MG immediate release tablet Take 1-2 tablets (5-10 mg total) by mouth every 6 (six) hours as needed for severe pain (pain score 7-10). 45 tablet 0   pantoprazole (PROTONIX) 40 MG tablet Take 1 tablet (40 mg total) by mouth daily. 30 tablet 3   prochlorperazine (COMPAZINE) 10 MG tablet Take 1 tablet (10 mg total) by mouth every 6 (six) hours as needed for nausea or vomiting. 30 tablet 0   No current facility-administered medications for this visit.    VITAL SIGNS: There were no vitals taken for this visit. There were no vitals filed for this visit.  Estimated body mass index is 18.27 kg/m as calculated from the following:   Height as of 10/02/23: 5\' 11"  (1.803 m).   Weight as of 10/02/23: 131 lb (59.4 kg).   PERFORMANCE STATUS (ECOG) : 1 - Symptomatic but completely ambulatory  Assessment NAD, resting in recliner RRR Normal breathing pattern Alert and oriented x3  Discussed the use of AI scribe software for clinical note  transcription with the patient, who gave verbal consent to proceed.   IMPRESSION:  Mr. Curatola presents to clinic today for symptom management follow-up. No acute distress. His wife [name] is present. Patient is in wheelchair. He is taking things one day at a time. The patient has been experiencing constipation, which has not been relieved by a stool softener. Despite this, he reports a good appetite. However, he has lost weight, dropping from 135 lbs. in September to 127 lbs. at the time of today's visit.   He reports recently prescribed Zofran sublingually. This medication has been effective  in preventing vomiting. He also takes Compazine for nausea, for which he needs a refill. Confirms he is takin his Protonix and Reglan also which has also been effective in aiding his nausea and indigestion.   Wife reports slow but noticeable improvement in his appetite. She and daughter are pushing protein drinks in between meals.   We discussed his current pain regimen, reports that his current medications are effective. However, he anticipates needing more medication before his next appointment. He is currently taking Hydrocodone ER, Oxycodone IR as needed for breakthrough pain, and gabapentin three times daily. Tolerating all medications without difficulty. The patient has been experiencing difficulty obtaining hydrocodone from his pharmacy, which has caused him to travel a significant distance to fill his prescription. I advised to contact office at least 3 days prior to running out to allow time for pharmacy to ensure medications are in stock or ordered.   The patient has a tumor, which is causing leg pain and difficulty lifting his foot. He has been researching medications online for sciatica, but it is unclear if he has started any new treatments. He has previously undergone radiation therapy for the tumor, and there is discussion of possibly pursuing more radiation treatment as discussed with Dr. Mosetta Putt. Mr. Torell is aware that he should not start any new medications without first consulting with his medical team regardless of if medication is over the counter.  Goals of care   8/28- We discussed his current illness and what it means in the larger context of his on-going co-morbidities. Natural disease trajectory and expectations were discussed.   He is realistic in his understanding of his cancer diagnosis and current treatment plan.  Mr. Pettitt is clear and his expressed wishes to continue to treat the treatable allow him every opportunity to continue to thrive while managing his symptoms  alone for a decent quality of life.  We discussed Her current illness and what it means in the larger context of Her on-going co-morbidities. Natural disease trajectory and expectations were discussed.  I discussed the importance of continued conversation with family and their medical providers regarding overall plan of care and treatment options, ensuring decisions are within the context of the patients values and GOCs.  PLAN:  Assessment & Plan Cancer Pain Management Patient reports adequate pain control with current regimen of hydrocodone and oxycodone. Concerns about pharmacy stock and ensuring adequate supply until next visit. -Refill hydrocodone ER and oxycodone prescriptions to last until next visit in January. -Continue Gabapentin as prescribed -Advise patient to contact office 2-3 days prior to running out of medication.  Gastroesophageal Reflux Disease (GERD) Patient on Protonix and Reglan, with refills available. -Continue current regimen.  Nausea/Vomiting Patient reports benefit from Zofran ODT, but supply runs out quickly. Possible insurance restrictions due to ODT formulation. -Continue Zofran ODT as needed, up to 3 times a day. -Consider alternative antiemetics if insurance restrictions continue.  Constipation Patient reports limited benefit from stool softener. -Continue current regimen. -Consider alternative constipation management strategies if no improvement.  Weight Loss Patient has lost weight since last visit but reports good appetite. -Monitor weight and nutritional intake. -Consider referral to dietitian if weight loss continues.  Follow-up - I will plan to see patient back in 3-4 weeks in collaboration with next Oncology visit.  Patient expressed understanding and was in agreement with this plan. He also understands that He can call the clinic at any time with any questions, concerns, or complaints.   Any controlled substances utilized were prescribed  in the context of palliative care. PDMP has been reviewed.   Visit consisted of counseling and education dealing with the complex and emotionally intense issues of symptom management and palliative care in the setting of serious and potentially life-threatening illness.  [name], AGPCNP-BC  Palliative Medicine Team/[facility]

## 2023-10-23 ENCOUNTER — Encounter: Payer: Self-pay | Admitting: Hematology

## 2023-10-23 NOTE — Progress Notes (Signed)
[facility]   [phone] [phone]   Clinic Follow up Note   Patient Care Team: [name], MD as PCP - General (Internal Medicine) Malachy Mood, MD as Attending Physician (Hematology and Oncology) [name], [name], NP as Nurse Practitioner [facility])  Date of Service:  10/22/2023  CHIEF COMPLAINT: f/u of HCC  CURRENT THERAPY:  Durvalumab every 4 weeks  Oncology History   Hepatocellular carcinoma (HCC) diagnosed 11/2019 by MRI for f/u of cirrhosis. S/p microwave ablation on 03/01/20. -MRI 06/01/22 showed suspicion for local recurrence and metastasis at L5. Biopsy of L5 confirmed metastatic HCC. S/p repeat microwave ablation on 07/17/22. -started lenvima 08/16/22 -staging CT CAP and bone scan on 09/19/22 showed: interval enlargement of right hepatic lobe HCC; new retropulsed bony fragment at L5; no other new lesions. His previous CT was in 05/2022 and he did not start Lenvima until 07/2022, so it's hard to evaluate if he is responding to [facility] or not, the progression is likely the nature course of his [facility]  -Restaging CT scan from January 02, 2023 showed slightly improved previously treated liver lesion, and additional area of concern in the liver which are difficult to compare with previous scan due to different timing of contrast.  No other evidence of disease progression.  Plan to obtain MRI as next scan.  I personally reviewed the CT scan images and discussed the findings with patient -Will continue Lenvima, he is tolerating well overall. -Due to his weight being above 60 kg now, I increased his Lenvima to 12 mg daily in Feb 2024 -repeated liver MRI on 04/23/2023 showed stable treated liver lesion, L5 bone mets has slightly increased in size.  Patient developed worsening low back pain in July 2024, repeated lumbar MRI showed significant disease progression in the L5 bone lesion.  I personally reviewed the scan images  and discussed the findings with patient. -I recommend palliative radiation to his L5 lesion for pain control -I recommend changing systemic treatment to immunotherapy, I discussed option of atezolizumab and bevacizumab, durvalumab and Imjudo, nivo and ipi etc. since he previously progressed on oral TKI, I recommend him to proceed with durvalumab and Imjudo. He started on 06/30/23 -the goal of therapy is curative/palliative to prolong life and prevent/improve cancer related symptoms.   Assessment and Plan    Liver Cancer Follow-up for liver cancer. Underwent radiation therapy four months ago. Tumor has not increased in size but continues to cause nerve pain and mobility issues. Surgery deemed too risky due to tumor location. Discussed risks and benefits of additional radiation and needle ablation with vertebral body cement injection. Prefers non-surgical options due to high surgical risk. - Consult with radiation oncologist Dr. Kathrynn Running regarding additional radiation therapy - Consult with interventional radiologist for potential needle ablation and vertebral body cement injection - Refill Zofran ODT and Protonix - Ensure pain medications are refilled by Lowella Bandy - Schedule follow-up in three weeks  Nerve Pain Secondary to Tumor Nerve pain in the leg due to tumor pressure, causing difficulty walking and occasional falls. Orthopedic consultation indicated surgery is too risky. Discussed non-surgical options to manage pain and improve mobility. - Consult with radiation oncologist and interventional radiologist as above - Continue current pain management regimen - Refill gabapentin and Reglan  Nausea Nausea managed with Zofran and Reglan. Reports good control with current medications. Prefers Zofran ODT and oral forms for effective management. - Refill Zofran ODT and Reglan - Ensure Compazine is available if needed  Plan -I reviewed his lumbar MRI from September 24, 2023.  -Will reach out to Dr.  Kathrynn Running and IR to see if he is a candidate for additional radiation or Osteocool procedure  - Schedule follow-up appointment in 4 weeks - Complete FMLA paperwork for extended leave if necessary.         SUMMARY OF ONCOLOGIC HISTORY: Oncology History  Hepatocellular carcinoma (HCC)  12/08/2019 Imaging   MR ABDOMEN WWO CONTRAST   IMPRESSION: Two small masses in the right hepatic lobe which have characteristics diagnostic for hepatocellular carcinoma in the setting of cirrhosis. (LI-RADS Category 5: Definitely HCC)   No evidence of abdominal metastatic disease or other significant abnormality.   03/01/2020 Initial Diagnosis   Hepatocellular carcinoma (HCC)   03/01/2020 Procedure   CT GUIDE TISSUE ABLATION     IMPRESSION: Successful percutaneous thermal ablation.   Signed,   [name], MD, RPVI   06/02/2020 Imaging   MR ABDOMEN WWO CONTRAST   IMPRESSION: 1. There has been interval ablation at two adjacent sites of the subcapsular right lobe of the liver, hepatic segment VI. There is no residual contrast enhancement at these adjacent sites. LI-RADS category 5 TR, nonviable. 2. There are scattered, subcentimeter foci of arterial hyperenhancement of the anterior right lobe of the liver, for example adjacent to the ablation site measuring 3 mm and in the medial anterior right lobe of the liver measuring 6 mm. This larger focus is unchanged compared to prior. Others were not previously seen. These findings are nonspecific and consistent with LI-RADS category 3. Attention on follow-up.   09/02/2020 Imaging   MR ABDOMEN WWO CONTRAST     IMPRESSION: 1. Post radiofrequency ablation along the margin of the RIGHT hemi liver. Heterogeneous appearance of this area on both T1 and T2. No sign of residual enhancement. LR TR nonviable. 2. Very subtle focus of restricted diffusion in the cephalad lateral segment LEFT hepatic lobe measuring 7 mm, corresponding to  low signal on image 27 of series 21. Arterial enhancement is not seen in this area and this areas not seen on prior imaging. Not clear whether this represents a new lesion as this area is obscured largely by artifact on most sequences on today's study and on previous imaging evaluations. LR category 3, consider a 3 to six-month follow-up with abdominal MRI. 3. Lesions of concern scattered about the RIGHT hepatic lobe may represent perfusional anomalies as they were not seen on today's study. 4. Stable mild dilation of the biliary tree.   11/27/2020 Imaging   MR ABDOMEN WWO CONTRAST   IMPRESSION: Minimal decrease in size of ablation defect in the peripheral anterior right hepatic lobe. No evidence of locally recurrent hepatocellular carcinoma or other suspicious liver lesions.   No evidence of metastatic disease or other acute findings.   04/03/2021 Imaging   MR ABDOMEN WWO CONTRAST   IMPRESSION: Stable ablation defect in the lateral right hepatic lobe, without evidence of recurrent tumor at this site.   5 mm hypovascular lesion in segment 5 of the right lobe was not definitely seen on previous study, but is too small to characterize. This shows no arterial phase hyperenhancement. Recommend continued follow-up by MRI in 6 months.   No evidence of abdominal metastatic disease.        09/03/2021 Imaging   MR ABDOMEN WWO CONTRAST   IMPRESSION: 1. Side-by-side ablation sites in the right hepatic lobe in segment 6. The more anterior site remains largely cystic in appearance and no findings  suspicious recurrent tumor in this area. There is however abnormal contrast enhancement around the larger more posterior and lateral ablation site worrisome for recurrent tumor. 2. Several small foci of early arterial phase enhancement in the liver. The peripheral lesions are likely vascular shunts. The more central lesions are likely dysplastic nodules. Recommend  continued surveillance. 3. Stable bilateral renal cysts.   12/07/2021 Imaging   MR ABDOMEN WWO CONTRAST  IMPRESSION: 1. Ablation defects in the right lobe of the liver, similar to the prior study, without definitive evidence to suggest local recurrence of disease or metastatic disease. 2. Multiple tiny arterial phase areas of nodular hyperenhancement scattered throughout the liver, predominantly in the right lobe of the liver, without perceptible signal abnormality or diffusion restriction on other pulse sequences, nonspecific, and likely small benign areas of vascular shunting. Continued attention on follow-up imaging is recommended. 3. Multiple Bosniak class 1 and Bosniak class 2 cysts in the kidneys bilaterally, similar to the prior study, as above. 4. Aortic atherosclerosis.     06/01/2022 Imaging   MR ABDOMEN WWO CONTRAST   IMPRESSION: 1. LR TR equivocal but suspicious findings with respect to previously treated disease in the RIGHT hepatic lobe given nodular enhancement adjacent and anterior superior to the ablation zone as described. 2. Given lack of late arterial phase due to bolus timing a second area of abnormality within the liver is suspicious for additional site of hepatocellular carcinoma showing washout appearance and capsule appearance on current imaging, cardiac pulsation limiting assessment in the area of concern. 3. Suspect bony metastatic disease in the lower lumbar spine at L5 level. Dedicated spinal imaging may be helpful for further evaluation.     06/18/2022 Imaging   MR Lumbar Spine W Wo Contrast   IMPRESSION: L5 vertebral body lesion is most consistent with a metastasis. There is probable trace ventral epidural extension   07/17/2022 Procedure   CT GUIDE TISSUE ABLATION   IMPRESSION: Successful CT-guided microwave ablation of recurrent hepatocellular carcinoma.   07/17/2022 Procedure   CT BONE TROCAR/NEEDLE BIOPSY SUPERFICIAL    IMPRESSION: Successful CT-guided core biopsy of L5 bone lesion.      07/17/2022 Pathology Results   SURGICAL PATHOLOGY  CASE: [id number]  PATIENT: MARCHELLO ROHAL  Surgical Pathology Report   SURGICAL PATHOLOGY  CASE: [id number]  PATIENT: Malachy Mood  Surgical Pathology Report      Clinical History: Hx of cirrhosis and small HCC with unexpected  suspicious lesion at L5. Rare HCC met to bone vs atypical hemangioma or  met from new primary. (crm)      FINAL MICROSCOPIC DIAGNOSIS:   A. 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 No acute findings within the chest. No suspicious findings for metastatic disease within the chest. 2. Unchanged 3 mm nodule within the medial right upper lobe. No new or suspicious lung nodules. 3. Suboptimal visualization of the known liver lesions, reflecting lack of IV contrast material. Similar appearance of ablation defect within the lateral right lobe of liver. 4. Left renal calculi. 5. Coronary artery calcifications. 6.  Aortic Atherosclerosis   06/25/2023 Imaging   NM Bone Scan whole body  IMPRESSION: 1. Known L5 metastasis demonstrates low level uptake, mildly increased from previous bone scan. 2. No new lesions demonstrated.   06/30/2023 -  Chemotherapy   Patient is on Treatment Plan : Hepatocellular  Carcinoma Tremelimumab-actl C1 D1 + Durvalumab q28d       Imaging        Discussed the use of AI scribe software for clinical note transcription with the patient, who gave verbal consent to proceed.  History of Present Illness   The patient, a 67 year old male with a known diagnosis of liver cancer, presents for a follow-up visit. He reports experiencing pain in the lower back and leg, which is suspected to be due to a tumor pressing against a  nerve. The pain has been affecting his mobility, causing him to walk with a stagger and use a cane for support. He denies any recent falls but admits to near falls due to his difficulty in lifting his foot.  In addition to the pain, the patient has been dealing with nausea. He has been taking multiple medications for this, including Zofran, Reglan, and Protonix, which he reports have been helpful. He expresses a preference for the Zofran that is administered under the tongue. He also mentions that he has not been using a prescribed lidocaine patch.  The patient's companion confirms that the patient's pain is due to a tumor pressing against a nerve, as per a consultation with an orthopedic doctor. The doctor had ruled out surgery due to the risky location of the tumor and suggested that the tumor could be shrunk. The patient's radiation treatment was completed about four months ago.  The patient also expresses concern about his ability to return to work given his current condition. He mentions that his job requires him to be on his feet and is unsure if he can extend his Family and Medical Leave Act (FMLA) leave.         All other systems were reviewed with the patient and are negative.  MEDICAL HISTORY:  Past Medical History:  Diagnosis Date   Headache    migraines   Hepatitis    HX OF hEP c ?    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    SURGICAL HISTORY: Past Surgical History:  Procedure Laterality Date   IR RADIOLOGIST EVAL & MGMT  01/25/2020   IR RADIOLOGIST EVAL & MGMT  03/21/2020   IR RADIOLOGIST EVAL & MGMT  06/07/2020   IR RADIOLOGIST EVAL & MGMT  09/05/2020   IR RADIOLOGIST EVAL & MGMT  12/05/2020   IR RADIOLOGIST EVAL & MGMT  05/09/2021   IR RADIOLOGIST EVAL & MGMT  09/06/2021   IR RADIOLOGIST EVAL & MGMT  12/12/2021   IR RADIOLOGIST EVAL & MGMT  06/25/2022   IR RADIOLOGIST EVAL & MGMT  07/24/2022   RADIOLOGY WITH ANESTHESIA N/A 03/01/2020    Procedure: CT WITH ANESTHESIA  THERMAL MICROWAVE  ABLATIION;  Surgeon: [name], MD;  Location: [facility];  Service: Anesthesiology;  Laterality: N/A;   RADIOLOGY WITH ANESTHESIA N/A 07/17/2022   Procedure: CT MICROWAVE ABLATION;  Surgeon: [name], MD;  Location: [facility];  Service: Radiology;  Laterality: N/A;    I have reviewed the social history and family history with the patient and they are unchanged from previous note.  ALLERGIES:  has no known allergies.  MEDICATIONS:  Current Outpatient Medications  Medication Sig Dispense Refill   gabapentin (NEURONTIN) 300 MG capsule Take 2 capsules (600 mg) in the morning, 2 capsules (600mg ) midday, 3 capsules (900 mg) at bedtime. 210 capsule 1   HYDROcodone Bitartrate ER 20 MG CP12 Take 1 capsule by mouth every 12 (  twelve) hours. 60 capsule 0   ibuprofen (ADVIL) 100 MG tablet Take 100 mg by mouth every 6 (six) hours as needed for fever.     losartan-hydrochlorothiazide (HYZAAR) 100-25 MG tablet TAKE 1 TABLET BY MOUTH EVERY DAY 90 tablet 3   metoCLOPramide (REGLAN) 10 MG tablet Take 1 tablet (10 mg total) by mouth 4 (four) times daily. 90 tablet 2   ondansetron (ZOFRAN) 8 MG tablet Take 1 tablet (8 mg total) by mouth every 8 (eight) hours as needed for nausea or vomiting. 30 tablet 3   ondansetron (ZOFRAN-ODT) 4 MG disintegrating tablet Take 1 tablet (4 mg total) by mouth every 8 (eight) hours as needed for nausea or vomiting. 20 tablet 0   oxyCODONE (OXY IR/ROXICODONE) 5 MG immediate release tablet Take 1-2 tablets (5-10 mg total) by mouth every 6 (six) hours as needed for severe pain (pain score 7-10). 45 tablet 0   pantoprazole (PROTONIX) 40 MG tablet Take 1 tablet (40 mg total) by mouth daily. 30 tablet 3   prochlorperazine (COMPAZINE) 10 MG tablet Take 1 tablet (10 mg total) by mouth every 6 (six) hours as needed for nausea or vomiting. 30 tablet 0   No current facility-administered medications for this visit.    PHYSICAL  EXAMINATION: ECOG PERFORMANCE STATUS: 2 - Symptomatic, <50% confined to bed  Vitals:   10/22/23 1149  BP: (!) 143/62  Pulse: 77  Resp: 16  Temp: (!) 97.3 F (36.3 C)  SpO2: 100%   Wt Readings from Last 3 Encounters:  10/22/23 127 lb 4.8 oz (57.7 kg)  10/02/23 131 lb (59.4 kg)  09/24/23 128 lb 11.2 oz (58.4 kg)     GENERAL:alert, no distress and comfortable SKIN: skin color, texture, turgor are normal, no rashes or significant lesions EYES: normal, Conjunctiva are pink and non-injected, sclera clear NECK: supple, thyroid normal size, non-tender, without nodularity LYMPH:  no palpable lymphadenopathy in the cervical, axillary  LUNGS: clear to auscultation and percussion with normal breathing effort HEART: regular rate & rhythm and no murmurs and no lower extremity edema ABDOMEN:abdomen soft, non-tender and normal bowel sounds Musculoskeletal:no cyanosis of digits and no clubbing  NEURO: alert & oriented x 3 with fluent speech, no focal motor/sensory deficits  Physical Exam          LABORATORY DATA:  I have reviewed the data as listed    Latest Ref Rng & Units 10/22/2023   11:10 AM 09/24/2023    9:31 AM 08/27/2023    9:28 AM  CBC  WBC 4.0 - 10.5 K/uL 4.9  5.3  6.3   Hemoglobin 13.0 - 17.0 g/dL 32.2  02.5  42.7   Hematocrit 39.0 - 52.0 % 36.6  35.7  35.3   Platelets 150 - 400 K/uL 181  213  188         Latest Ref Rng & Units 10/22/2023   11:10 AM 09/24/2023    9:31 AM 08/27/2023    9:28 AM  CMP  Glucose 70 - 99 mg/dL 80  86  91   BUN 8 - 23 mg/dL 23  27  31    Creatinine 0.61 - 1.24 mg/dL 0.62  3.76  2.83   Sodium 135 - 145 mmol/L 144  141  144   Potassium 3.5 - 5.1 mmol/L 3.6  3.8  3.9   Chloride 98 - 111 mmol/L 112  107  111   CO2 22 - 32 mmol/L 26  27  22    Calcium 8.9 - 10.3  mg/dL 16.1  09.6  9.7   Total Protein 6.5 - 8.1 g/dL 7.6  8.1  8.2   Total Bilirubin <1.2 mg/dL 0.3  0.4  0.8   Alkaline Phos 38 - 126 U/L 58  76  62   AST 15 - 41 U/L 13  13  22     ALT 0 - 44 U/L 7  9  26        RADIOGRAPHIC STUDIES: I have personally reviewed the radiological images as listed and agreed with the findings in the report. No results found.    Orders Placed This Encounter  Procedures   CBC with Differential (Cancer Center Only)    Standing Status:   Future    Expected Date:   12/17/2023    Expiration Date:   12/16/2024   CMP (Cancer Center only)    Standing Status:   Future    Expected Date:   12/17/2023    Expiration Date:   12/16/2024   T4    Standing Status:   Future    Expected Date:   12/17/2023    Expiration Date:   12/16/2024   TSH    Standing Status:   Future    Expected Date:   12/17/2023    Expiration Date:   12/16/2024   CBC with Differential (Cancer Center Only)    Standing Status:   Future    Expected Date:   01/14/2024    Expiration Date:   01/13/2025   CMP (Cancer Center only)    Standing Status:   Future    Expected Date:   01/14/2024    Expiration Date:   01/13/2025   T4    Standing Status:   Future    Expected Date:   01/14/2024    Expiration Date:   01/13/2025   TSH    Standing Status:   Future    Expected Date:   01/14/2024    Expiration Date:   01/13/2025   All questions were answered. The patient knows to call the clinic with any problems, questions or concerns. No barriers to learning was detected. The total time spent in the appointment was 30 minutes.     Malachy Mood, MD 10/22/2023

## 2023-10-27 ENCOUNTER — Other Ambulatory Visit: Payer: Self-pay | Admitting: Radiation Oncology

## 2023-10-27 ENCOUNTER — Other Ambulatory Visit: Payer: Self-pay | Admitting: Radiation Therapy

## 2023-10-27 DIAGNOSIS — C7951 Secondary malignant neoplasm of bone: Secondary | ICD-10-CM

## 2023-10-28 ENCOUNTER — Encounter: Payer: Self-pay | Admitting: Nurse Practitioner

## 2023-10-28 ENCOUNTER — Ambulatory Visit (HOSPITAL_COMMUNITY)
Admission: RE | Admit: 2023-10-28 | Discharge: 2023-10-28 | Disposition: A | Discharge status: Home / Self Care | Payer: Commercial Managed Care - PPO | Source: Ambulatory Visit | Attending: Radiation Oncology | Admitting: Radiation Oncology

## 2023-10-28 DIAGNOSIS — C7951 Secondary malignant neoplasm of bone: Secondary | ICD-10-CM | POA: Insufficient documentation

## 2023-11-04 ENCOUNTER — Telehealth: Payer: Self-pay

## 2023-11-04 NOTE — Telephone Encounter (Signed)
Received fax from [facility] for clearance. Patient last seen in office on 11/18/22. Please call patient to schedule office visit with Dr. Demaris Callander.

## 2023-11-06 NOTE — Telephone Encounter (Signed)
Spoke with pt, scheduled appt for tomorrow, 12/27

## 2023-11-07 ENCOUNTER — Telehealth: Payer: Self-pay | Admitting: Internal Medicine

## 2023-11-07 ENCOUNTER — Encounter: Payer: Self-pay | Admitting: Internal Medicine

## 2023-11-07 ENCOUNTER — Ambulatory Visit: Payer: Commercial Managed Care - PPO | Admitting: Internal Medicine

## 2023-11-07 VITALS — BP 118/60 | HR 68 | Temp 98.3°F | Ht 71.0 in | Wt 130.0 lb

## 2023-11-07 DIAGNOSIS — Z23 Encounter for immunization: Secondary | ICD-10-CM | POA: Diagnosis not present

## 2023-11-07 DIAGNOSIS — Z01818 Encounter for other preprocedural examination: Secondary | ICD-10-CM | POA: Insufficient documentation

## 2023-11-07 DIAGNOSIS — C7951 Secondary malignant neoplasm of bone: Secondary | ICD-10-CM

## 2023-11-07 DIAGNOSIS — R011 Cardiac murmur, unspecified: Secondary | ICD-10-CM

## 2023-11-07 NOTE — Progress Notes (Signed)
Subjective:    Patient ID: Corey Der Sr., male    DOB: 08/29/1956, 67 y.o.   MRN: [id number]  HPI Here for preop evaluation prior to surgery --to remove bony fragment of L5 protruding into his spinal canal. This is from known metastatic spread of his hepatocellular carcinoma  Has ongoing pain in back on right and down leg to foot Now on narcotic pain relievers  Now getting immunotherapy for the cancer  No prior heart disease No chest pain No SOB No dizziness or syncope Slight ankle swelling--mostly on right No palpitations  Current Outpatient Medications on File Prior to Visit  Medication Sig Dispense Refill   gabapentin (NEURONTIN) 300 MG capsule Take 2 capsules (600 mg) in the morning, 2 capsules (600mg ) midday, 3 capsules (900 mg) at bedtime. 210 capsule 1   HYDROcodone Bitartrate ER 20 MG CP12 Take 1 capsule by mouth every 12 (twelve) hours. 60 capsule 0   ibuprofen (ADVIL) 100 MG tablet Take 100 mg by mouth every 6 (six) hours as needed for fever.     losartan-hydrochlorothiazide (HYZAAR) 100-25 MG tablet TAKE 1 TABLET BY MOUTH EVERY DAY 90 tablet 3   metoCLOPramide (REGLAN) 10 MG tablet Take 1 tablet (10 mg total) by mouth 4 (four) times daily. 90 tablet 2   ondansetron (ZOFRAN) 8 MG tablet Take 1 tablet (8 mg total) by mouth every 8 (eight) hours as needed for nausea or vomiting. 30 tablet 3   ondansetron (ZOFRAN-ODT) 4 MG disintegrating tablet Take 1 tablet (4 mg total) by mouth every 8 (eight) hours as needed for nausea or vomiting. 20 tablet 0   oxyCODONE (OXY IR/ROXICODONE) 5 MG immediate release tablet Take 1-2 tablets (5-10 mg total) by mouth every 6 (six) hours as needed for severe pain (pain score 7-10). 45 tablet 0   pantoprazole (PROTONIX) 40 MG tablet Take 1 tablet (40 mg total) by mouth daily. 30 tablet 3   prochlorperazine (COMPAZINE) 10 MG tablet Take 1 tablet (10 mg total) by mouth every 6 (six) hours as needed for nausea or vomiting. 30 tablet 0    No current facility-administered medications on file prior to visit.    No Known Allergies  Past Medical History:  Diagnosis Date   Headache    migraines   Hepatitis    HX OF hEP c ?    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    Past Surgical History:  Procedure Laterality Date   IR RADIOLOGIST EVAL & MGMT  01/25/2020   IR RADIOLOGIST EVAL & MGMT  03/21/2020   IR RADIOLOGIST EVAL & MGMT  06/07/2020   IR RADIOLOGIST EVAL & MGMT  09/05/2020   IR RADIOLOGIST EVAL & MGMT  12/05/2020   IR RADIOLOGIST EVAL & MGMT  05/09/2021   IR RADIOLOGIST EVAL & MGMT  09/06/2021   IR RADIOLOGIST EVAL & MGMT  12/12/2021   IR RADIOLOGIST EVAL & MGMT  06/25/2022   IR RADIOLOGIST EVAL & MGMT  07/24/2022   RADIOLOGY WITH ANESTHESIA N/A 03/01/2020   Procedure: CT WITH ANESTHESIA  THERMAL MICROWAVE  ABLATIION;  Surgeon: [name], MD;  Location: [facility];  Service: Anesthesiology;  Laterality: N/A;   RADIOLOGY WITH ANESTHESIA N/A 07/17/2022   Procedure: CT MICROWAVE ABLATION;  Surgeon: [name], MD;  Location: [facility];  Service: Radiology;  Laterality: N/A;    Family History  Problem Relation Age of Onset   Arthritis Mother  Cancer Neg Hx    Diabetes Neg Hx    Drug abuse Neg Hx    Heart disease Neg Hx    Hyperlipidemia Neg Hx    Hypertension Neg Hx    Kidney disease Neg Hx    Stroke Neg Hx     Social History   Socioeconomic History   Marital status: Divorced    Spouse name: Not on file   Number of children: 2   Years of education: 12   Highest education level: 12th grade  Occupational History   Occupation: Clinical biochemist: [employer]    Comment: Out on disability for now  Tobacco Use   Smoking status: Former    Passive exposure: Current   Smokeless tobacco: Never   Tobacco comments:    3 per week  Vaping Use   Vaping status: Never Used  Substance and Sexual Activity   Alcohol use: Not Currently   Drug use: No   Sexual  activity: Not Currently  Other Topics Concern   Not on file  Social History Narrative   Divorced but has relationship with ex   2 children   Daughter lives with him   Son in [place]      No living will   Ex wife Myraette should make decisions on his behalf   Would accept resuscitation   Not sure about tube feeds   Social Drivers of Health   Financial Resource Strain: Medium Risk (11/06/2023)   Overall Financial Resource Strain (CARDIA)    Difficulty of Paying Living Expenses: Somewhat hard  Food Insecurity: No Food Insecurity (11/06/2023)   Hunger Vital Sign    Worried About Running Out of Food in the Last Year: Never true    Ran Out of Food in the Last Year: Never true  Transportation Needs: No Transportation Needs (11/06/2023)   PRAPARE - Administrator, Civil Service (Medical): No    Lack of Transportation (Non-Medical): No  Physical Activity: Sufficiently Active (11/06/2023)   Exercise Vital Sign    Days of Exercise per Week: 7 days    Minutes of Exercise per Session: 30 min  Stress: No Stress Concern Present (11/06/2023)   [employer] of Occupational Health - Occupational Stress Questionnaire    Feeling of Stress : Only a little  Social Connections: Unknown (11/06/2023)   Social Connection and Isolation Panel [NHANES]    Frequency of Communication with Friends and Family: More than three times a week    Frequency of Social Gatherings with Friends and Family: Patient declined    Attends Religious Services: Patient declined    Database administrator or Organizations: Yes    Attends Banker Meetings: Patient declined    Marital Status: Divorced  Catering manager Violence: Not At Risk (06/13/2023)   Humiliation, Afraid, Rape, and Kick questionnaire    Fear of Current or Ex-Partner: No    Emotionally Abused: No    Physically Abused: No    Sexually Abused: No   Review of Systems Sleeps flat in bed--no PND Appetite is okay--weight has  stabilized No recent illnesses Has had heart murmur since birth     Objective:   Physical Exam Constitutional:      Appearance: Normal appearance.  Cardiovascular:     Rate and Rhythm: Normal rate and regular rhythm.     Heart sounds:     No gallop.     Comments: Loud systolic murmur heard at base and apex  Pulmonary:     Effort: Pulmonary effort is normal.     Breath sounds: Normal breath sounds. No wheezing or rales.  Abdominal:     Palpations: Abdomen is soft.     Tenderness: There is no abdominal tenderness.  Musculoskeletal:     Cervical back: Neck supple.     Right lower leg: No edema.     Left lower leg: No edema.  Lymphadenopathy:     Cervical: No cervical adenopathy.  Neurological:     Mental Status: He is alert.  Psychiatric:        Mood and Affect: Mood normal.        Behavior: Behavior normal.            Assessment & Plan:

## 2023-11-07 NOTE — Assessment & Plan Note (Signed)
Needs surgery for L5 bone fragment in spinal canal

## 2023-11-07 NOTE — Telephone Encounter (Signed)
Spoke to pt, pt states a echocardiogram was scheduled for 11/27/23 at heart care at [facility]. Pt requested for referral to be sent to [facility] instead, preferably [facility]? Pt states he does most of his care at [facility]. Call back # [phone]

## 2023-11-07 NOTE — Assessment & Plan Note (Signed)
Loud systolic murmur Diastolic component not as obvious today Will proceed with echo

## 2023-11-07 NOTE — Assessment & Plan Note (Signed)
EKG shows sinus at 67 Normal axis and intervals. No ischemic or hypertrophy. Since 07/17/22--axis and non specific T changes have normalized Nothing to suggest coronary ischemia and no pulmonary issues Only concern is lifelong murmur---and never got the ordered echo last year Will order this urgent before the surgery No anticoagulation to stop

## 2023-11-07 NOTE — Addendum Note (Signed)
Addended by: Eual Fines on: 11/07/2023 10:45 AM   Modules accepted: Orders

## 2023-11-10 NOTE — Addendum Note (Signed)
Addended by: Tillman Abide I on: 11/10/2023 04:28 PM   Modules accepted: Orders

## 2023-11-13 ENCOUNTER — Other Ambulatory Visit: Payer: Self-pay | Admitting: Nurse Practitioner

## 2023-11-13 ENCOUNTER — Other Ambulatory Visit (HOSPITAL_COMMUNITY): Payer: Self-pay

## 2023-11-13 DIAGNOSIS — C7951 Secondary malignant neoplasm of bone: Secondary | ICD-10-CM

## 2023-11-13 DIAGNOSIS — C220 Liver cell carcinoma: Secondary | ICD-10-CM

## 2023-11-13 DIAGNOSIS — G893 Neoplasm related pain (acute) (chronic): Secondary | ICD-10-CM

## 2023-11-13 DIAGNOSIS — Z515 Encounter for palliative care: Secondary | ICD-10-CM

## 2023-11-13 MED ORDER — HYDROCODONE BITARTRATE ER 20 MG PO CP12
1.0000 | ORAL_CAPSULE | Freq: Two times a day (BID) | ORAL | 0 refills | Status: DC
  Filled 2023-11-13: qty 60, 30d supply, fill #0

## 2023-11-13 NOTE — Telephone Encounter (Signed)
 Echo scheduled for  11/27/23 with [facility] Burl has been canceled.   Rescheduled for Corey Armstrong  Date: 11/26/2023  Time: 11:00 AM  Visit Type: ECHOCARDIOGRAM [[id number]]  Provider: Lucien Mons- ECHO 1

## 2023-11-14 ENCOUNTER — Other Ambulatory Visit (HOSPITAL_COMMUNITY): Payer: Self-pay

## 2023-11-18 ENCOUNTER — Inpatient Hospital Stay (HOSPITAL_COMMUNITY)
Admission: EM | Admit: 2023-11-18 | Discharge: 2023-11-21 | DRG: 519 | Disposition: A | Discharge status: Home / Self Care | Payer: Commercial Managed Care - PPO | Source: Other Acute Inpatient Hospital | Attending: Internal Medicine | Admitting: Internal Medicine

## 2023-11-18 ENCOUNTER — Encounter (HOSPITAL_COMMUNITY): Payer: Self-pay | Admitting: Internal Medicine

## 2023-11-18 ENCOUNTER — Telehealth: Payer: Self-pay | Admitting: Radiation Therapy

## 2023-11-18 ENCOUNTER — Encounter: Payer: Self-pay | Admitting: Hematology

## 2023-11-18 ENCOUNTER — Encounter (HOSPITAL_COMMUNITY): Payer: Self-pay

## 2023-11-18 DIAGNOSIS — Z87891 Personal history of nicotine dependence: Secondary | ICD-10-CM

## 2023-11-18 DIAGNOSIS — M8448XA Pathological fracture, other site, initial encounter for fracture: Principal | ICD-10-CM | POA: Diagnosis present

## 2023-11-18 DIAGNOSIS — S32040S Wedge compression fracture of fourth lumbar vertebra, sequela: Secondary | ICD-10-CM

## 2023-11-18 DIAGNOSIS — N179 Acute kidney failure, unspecified: Secondary | ICD-10-CM | POA: Insufficient documentation

## 2023-11-18 DIAGNOSIS — Z8505 Personal history of malignant neoplasm of liver: Secondary | ICD-10-CM

## 2023-11-18 DIAGNOSIS — C7951 Secondary malignant neoplasm of bone: Secondary | ICD-10-CM | POA: Diagnosis present

## 2023-11-18 DIAGNOSIS — G894 Chronic pain syndrome: Secondary | ICD-10-CM | POA: Insufficient documentation

## 2023-11-18 DIAGNOSIS — M4856XA Collapsed vertebra, not elsewhere classified, lumbar region, initial encounter for fracture: Principal | ICD-10-CM | POA: Diagnosis present

## 2023-11-18 DIAGNOSIS — I129 Hypertensive chronic kidney disease with stage 1 through stage 4 chronic kidney disease, or unspecified chronic kidney disease: Secondary | ICD-10-CM | POA: Diagnosis present

## 2023-11-18 DIAGNOSIS — C220 Liver cell carcinoma: Secondary | ICD-10-CM | POA: Diagnosis present

## 2023-11-18 DIAGNOSIS — I352 Nonrheumatic aortic (valve) stenosis with insufficiency: Secondary | ICD-10-CM | POA: Diagnosis present

## 2023-11-18 DIAGNOSIS — M48061 Spinal stenosis, lumbar region without neurogenic claudication: Secondary | ICD-10-CM | POA: Diagnosis not present

## 2023-11-18 DIAGNOSIS — I959 Hypotension, unspecified: Secondary | ICD-10-CM | POA: Diagnosis present

## 2023-11-18 DIAGNOSIS — Z79899 Other long term (current) drug therapy: Secondary | ICD-10-CM | POA: Diagnosis not present

## 2023-11-18 DIAGNOSIS — S32040A Wedge compression fracture of fourth lumbar vertebra, initial encounter for closed fracture: Secondary | ICD-10-CM

## 2023-11-18 DIAGNOSIS — S32050D Wedge compression fracture of fifth lumbar vertebra, subsequent encounter for fracture with routine healing: Secondary | ICD-10-CM | POA: Diagnosis not present

## 2023-11-18 DIAGNOSIS — M21371 Foot drop, right foot: Secondary | ICD-10-CM | POA: Diagnosis present

## 2023-11-18 DIAGNOSIS — R011 Cardiac murmur, unspecified: Secondary | ICD-10-CM | POA: Diagnosis not present

## 2023-11-18 DIAGNOSIS — G893 Neoplasm related pain (acute) (chronic): Secondary | ICD-10-CM | POA: Diagnosis present

## 2023-11-18 DIAGNOSIS — M4807 Spinal stenosis, lumbosacral region: Secondary | ICD-10-CM | POA: Diagnosis present

## 2023-11-18 DIAGNOSIS — R110 Nausea: Secondary | ICD-10-CM | POA: Diagnosis not present

## 2023-11-18 DIAGNOSIS — I358 Other nonrheumatic aortic valve disorders: Secondary | ICD-10-CM | POA: Diagnosis present

## 2023-11-18 DIAGNOSIS — I10 Essential (primary) hypertension: Secondary | ICD-10-CM | POA: Diagnosis present

## 2023-11-18 DIAGNOSIS — G992 Myelopathy in diseases classified elsewhere: Secondary | ICD-10-CM | POA: Diagnosis present

## 2023-11-18 DIAGNOSIS — I350 Nonrheumatic aortic (valve) stenosis: Secondary | ICD-10-CM

## 2023-11-18 DIAGNOSIS — R0609 Other forms of dyspnea: Secondary | ICD-10-CM | POA: Diagnosis present

## 2023-11-18 DIAGNOSIS — D638 Anemia in other chronic diseases classified elsewhere: Secondary | ICD-10-CM | POA: Insufficient documentation

## 2023-11-18 DIAGNOSIS — N182 Chronic kidney disease, stage 2 (mild): Secondary | ICD-10-CM | POA: Diagnosis present

## 2023-11-18 DIAGNOSIS — R634 Abnormal weight loss: Secondary | ICD-10-CM | POA: Diagnosis present

## 2023-11-18 DIAGNOSIS — M4850XA Collapsed vertebra, not elsewhere classified, site unspecified, initial encounter for fracture: Principal | ICD-10-CM | POA: Diagnosis present

## 2023-11-18 DIAGNOSIS — N189 Chronic kidney disease, unspecified: Secondary | ICD-10-CM

## 2023-11-18 LAB — CBC WITH DIFFERENTIAL/PLATELET
Abs Immature Granulocytes: 0.01 10*3/uL (ref 0.00–0.07)
Basophils Absolute: 0 10*3/uL (ref 0.0–0.1)
Basophils Relative: 1 %
Eosinophils Absolute: 0.3 10*3/uL (ref 0.0–0.5)
Eosinophils Relative: 5 %
HCT: 33.1 % — ABNORMAL LOW (ref 39.0–52.0)
Hemoglobin: 10.9 g/dL — ABNORMAL LOW (ref 13.0–17.0)
Immature Granulocytes: 0 %
Lymphocytes Relative: 26 %
Lymphs Abs: 1.5 10*3/uL (ref 0.7–4.0)
MCH: 32.8 pg (ref 26.0–34.0)
MCHC: 32.9 g/dL (ref 30.0–36.0)
MCV: 99.7 fL (ref 80.0–100.0)
Monocytes Absolute: 0.6 10*3/uL (ref 0.1–1.0)
Monocytes Relative: 11 %
Neutro Abs: 3.2 10*3/uL (ref 1.7–7.7)
Neutrophils Relative %: 57 %
Platelets: 197 10*3/uL (ref 150–400)
RBC: 3.32 MIL/uL — ABNORMAL LOW (ref 4.22–5.81)
RDW: 12.8 % (ref 11.5–15.5)
WBC: 5.5 10*3/uL (ref 4.0–10.5)
nRBC: 0 % (ref 0.0–0.2)

## 2023-11-18 LAB — PROTIME-INR
INR: 1.1 (ref 0.8–1.2)
Prothrombin Time: 14.7 s (ref 11.4–15.2)

## 2023-11-18 LAB — COMPREHENSIVE METABOLIC PANEL
ALT: 13 U/L (ref 0–44)
AST: 17 U/L (ref 15–41)
Albumin: 3.9 g/dL (ref 3.5–5.0)
Alkaline Phosphatase: 59 U/L (ref 38–126)
Anion gap: 9 (ref 5–15)
BUN: 29 mg/dL — ABNORMAL HIGH (ref 8–23)
CO2: 25 mmol/L (ref 22–32)
Calcium: 9.7 mg/dL (ref 8.9–10.3)
Chloride: 108 mmol/L (ref 98–111)
Creatinine, Ser: 1.45 mg/dL — ABNORMAL HIGH (ref 0.61–1.24)
GFR, Estimated: 53 mL/min — ABNORMAL LOW (ref >60)
Glucose, Bld: 116 mg/dL — ABNORMAL HIGH (ref 70–99)
Potassium: 4.6 mmol/L (ref 3.5–5.1)
Sodium: 142 mmol/L (ref 135–145)
Total Bilirubin: 0.4 mg/dL (ref 0.0–1.2)
Total Protein: 7.2 g/dL (ref 6.5–8.1)

## 2023-11-18 LAB — HIV ANTIBODY (ROUTINE TESTING W REFLEX): HIV Screen 4th Generation wRfx: NONREACTIVE

## 2023-11-18 MED ORDER — OXYCODONE HCL 5 MG PO TABS
5.0000 mg | ORAL_TABLET | Freq: Four times a day (QID) | ORAL | Status: DC | PRN
  Administered 2023-11-18 – 2023-11-20 (×5): 10 mg via ORAL
  Administered 2023-11-21: 5 mg via ORAL
  Filled 2023-11-18 (×2): qty 2
  Filled 2023-11-18: qty 1
  Filled 2023-11-18 (×3): qty 2

## 2023-11-18 MED ORDER — METOCLOPRAMIDE HCL 10 MG PO TABS
10.0000 mg | ORAL_TABLET | Freq: Four times a day (QID) | ORAL | Status: DC
  Administered 2023-11-18 – 2023-11-21 (×9): 10 mg via ORAL
  Filled 2023-11-18 (×9): qty 1

## 2023-11-18 MED ORDER — IBUPROFEN 200 MG PO TABS: 100.0000 mg | ORAL_TABLET | Freq: Four times a day (QID) | ORAL | Status: DC | PRN

## 2023-11-18 MED ORDER — PROCHLORPERAZINE MALEATE 10 MG PO TABS: 10.0000 mg | ORAL_TABLET | Freq: Four times a day (QID) | ORAL | Status: DC | PRN

## 2023-11-18 MED ORDER — SODIUM CHLORIDE 0.9% FLUSH
3.0000 mL | Freq: Two times a day (BID) | INTRAVENOUS | Status: DC
  Administered 2023-11-19 – 2023-11-21 (×5): 3 mL via INTRAVENOUS

## 2023-11-18 MED ORDER — HEPARIN SODIUM (PORCINE) 5000 UNIT/ML IJ SOLN
5000.0000 [IU] | Freq: Three times a day (TID) | INTRAMUSCULAR | Status: DC
  Administered 2023-11-18 – 2023-11-19 (×3): 5000 [IU] via SUBCUTANEOUS
  Filled 2023-11-18 (×3): qty 1

## 2023-11-18 MED ORDER — ACETAMINOPHEN 325 MG PO TABS: 650.0000 mg | ORAL_TABLET | Freq: Four times a day (QID) | ORAL | Status: DC | PRN

## 2023-11-18 MED ORDER — SODIUM CHLORIDE 0.9 % IV SOLN: 250.0000 mL | INTRAVENOUS | Status: AC | PRN

## 2023-11-18 MED ORDER — SODIUM CHLORIDE 0.9% FLUSH: 3.0000 mL | INTRAVENOUS | Status: DC | PRN

## 2023-11-18 MED ORDER — DEXTROSE-SODIUM CHLORIDE 5-0.45 % IV SOLN: INTRAVENOUS | Status: DC

## 2023-11-18 MED ORDER — HYDROCODONE BITARTRATE ER 20 MG PO CP12: 1.0000 | ORAL_CAPSULE | Freq: Two times a day (BID) | ORAL | Status: DC

## 2023-11-18 MED ORDER — ONDANSETRON HCL 4 MG/2ML IJ SOLN: 4.0000 mg | Freq: Four times a day (QID) | INTRAMUSCULAR | Status: DC | PRN

## 2023-11-18 NOTE — H&P (Addendum)
 History and Physical    Corey Armstrong Sr. FMW:[id number] DOB: September 09, 1956 DOA: 11/18/2023  PCP: Corey Charlie FERNS, MD   Patient coming from: Home   Chief Complaint: Direct admit from neurosurgery clinic for surgical intervention of L5 compression fracture   HPI:  Corey Tarri Sierra Sr. is a 68 y.o. 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  Patient has been following Dr. Lanny outpatient for the management of hepatocellular carcinoma.  Recently he has been referred to radiation oncologist Dr. Patrcia and interventional radiologist for needed ablation and vertebral body cement injection. Patient has been seen neurosurgery today Dr. Colon and patient has been referred from neurosurgery clinic to [facility] as a direct admission for neurosurgical intervention either tonight and tomorrow.  Also neurosurgery requested for surgical clearance, lab and echocardiogram.  My evaluation at the bedside patient reported that he has right-sided back pain which radiates to his right sided upper and lower extremity.  Patient denies any associated tingling or numbness.  Denies any bowel and bladder incontinence.  Reported history of hypertension compliant with blood pressure regimen.  Patient reported family history of heart murmur and he has also history of heart murmur since childhood but never had any formal evaluation by cardiology.  I have consulted and called on-call neurosurgery team provider Lauraine Pickle, PA and informed that patient is here in the hospital.  Stated that will see patient in the a.m.  Patient's most recent vitals 105/53, respiratory to 18 heart rate 77 and O2 sat 99% room air. Checking CBC, CMP,  pro time INR and echocardiogram.    Significant labs in the ED: Lab Orders         CBC with Differential/Platelet         Comprehensive metabolic panel         Protime-INR         HIV Antibody (routine testing w rflx)         Comprehensive metabolic panel         CBC       Review of Systems:  Review of Systems  Constitutional:  Positive for malaise/fatigue and weight loss. Negative for chills and fever.  Respiratory:  Negative for cough, sputum production and shortness of breath.   Cardiovascular:  Negative for chest pain and leg swelling.  Gastrointestinal:  Negative for abdominal pain, constipation, diarrhea, heartburn, nausea and vomiting.  Genitourinary:  Negative for dysuria, frequency, hematuria and urgency.  Musculoskeletal:  Positive for back pain. Negative for falls, joint pain, myalgias and neck pain.  Neurological:  Negative for dizziness, weakness and headaches.  Psychiatric/Behavioral:  The patient is not nervous/anxious.   All other systems reviewed and are negative.   Past Medical History:  Diagnosis Date   Headache    migraines   Hepatitis    HX OF hEP c ?    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    Past Surgical History:  Procedure Laterality Date   IR RADIOLOGIST EVAL & MGMT  01/25/2020   IR RADIOLOGIST EVAL & MGMT  03/21/2020   IR RADIOLOGIST EVAL & MGMT  06/07/2020   IR RADIOLOGIST EVAL & MGMT  09/05/2020   IR RADIOLOGIST EVAL & MGMT  12/05/2020   IR RADIOLOGIST EVAL & MGMT  05/09/2021   IR RADIOLOGIST EVAL & MGMT  09/06/2021   IR RADIOLOGIST EVAL & MGMT  12/12/2021   IR RADIOLOGIST EVAL & MGMT  06/25/2022   IR RADIOLOGIST EVAL & MGMT  07/24/2022   RADIOLOGY WITH ANESTHESIA N/A 03/01/2020   Procedure: CT WITH ANESTHESIA  THERMAL MICROWAVE  ABLATIION;  Surgeon: Karalee Beat, MD;  Location: [facility];  Service: Anesthesiology;  Laterality: N/A;   RADIOLOGY WITH ANESTHESIA N/A 07/17/2022   Procedure: CT MICROWAVE  ABLATION;  Surgeon: Karalee Beat POUR, MD;  Location: [facility];  Service: Radiology;  Laterality: N/A;     reports that he has quit smoking. He has been exposed to tobacco smoke. He has never used smokeless tobacco. He reports that he does not currently use alcohol. He reports that he does not use drugs.  No Known Allergies  Family History  Problem Relation Age of Onset   Arthritis Mother    Cancer Neg Hx    Diabetes Neg Hx    Drug abuse Neg Hx    Heart disease Neg Hx    Hyperlipidemia Neg Hx    Hypertension Neg Hx    Kidney disease Neg Hx    Stroke Neg Hx     Prior to Admission medications   Medication Sig Start Date End Date Taking? Authorizing Provider  gabapentin  (NEURONTIN ) 300 MG capsule Take 2 capsules (600 mg) in the morning, 2 capsules (600mg ) midday, 3 capsules (900 mg) at bedtime. 10/01/23   [name], [name], NP  HYDROcodone  Bitartrate ER 20 MG CP12 Take 1 capsule by mouth every 12 (twelve) hours. 11/13/23   [name], Athena N, NP  ibuprofen  (ADVIL ) 100 MG tablet Take 100 mg by mouth every 6 (six) hours as needed for fever.    [provider]  losartan -hydrochlorothiazide (HYZAAR) 100-25 MG tablet TAKE 1 TABLET BY MOUTH EVERY DAY 07/16/23   Corey Charlie FERNS, MD  metoCLOPramide  (REGLAN ) 10 MG tablet Take 1 tablet (10 mg total) by mouth 4 (four) times daily. 10/07/23   [name], Athena N, NP  ondansetron  (ZOFRAN ) 8 MG tablet Take 1 tablet (8 mg total) by mouth every 8 (eight) hours as needed for nausea or vomiting. 08/27/23   Burton, Lacie K, NP  ondansetron  (ZOFRAN -ODT) 4 MG disintegrating tablet Take 1 tablet (4 mg total) by mouth every 8 (eight) hours as needed for nausea or vomiting. 10/22/23   [name], MD  oxyCODONE  (OXY IR/ROXICODONE ) 5 MG immediate release tablet Take 1-2 tablets (5-10 mg total) by mouth every 6 (six) hours as needed for severe pain (pain score 7-10). 10/22/23   [name], Athena N, NP  pantoprazole  (PROTONIX )  40 MG tablet Take 1 tablet (40 mg total) by mouth daily. 09/24/23   [name], Athena N, NP  prochlorperazine  (COMPAZINE ) 10 MG tablet Take 1 tablet (10 mg total) by mouth every 6 (six) hours as needed for nausea or vomiting. 10/22/23   [place] [name], NP     Physical Exam: Vitals:   11/18/23 1951 11/18/23 2332  BP: (!) 109/53 (!) 107/45  Pulse: 77 63  Resp: 18 18  Temp: 98.2 F (36.8 C) 98.2 F (36.8 C)  TempSrc: Oral Oral  SpO2: 99% 98%  Weight: 58.7 kg   Height: 5' 7 (1.702 m)     Physical Exam Constitutional:      Appearance: He is ill-appearing.     Comments: Cachectic appearing  HENT:     Head: Normocephalic and atraumatic.     Mouth/Throat:  Mouth: Mucous membranes are moist.  Eyes:     Conjunctiva/sclera: Conjunctivae normal.  Cardiovascular:     Rate and Rhythm: Normal rate and regular rhythm.     Pulses: Normal pulses.     Heart sounds: Murmur heard.     No friction rub. No gallop.  Pulmonary:     Effort: Pulmonary effort is normal.     Breath sounds: Normal breath sounds.  Abdominal:     General: Bowel sounds are normal. There is no distension.     Tenderness: There is no abdominal tenderness.  Musculoskeletal:     Cervical back: Normal range of motion and neck supple.     Right lower leg: No edema.     Left lower leg: No edema.     Comments: Right-sided paravertebral pain around the lumbar area.   Skin:    General: Skin is dry.     Capillary Refill: Capillary refill takes less than 2 seconds.  Neurological:     Mental Status: He is alert and oriented to person, place, and time.     Cranial Nerves: No cranial nerve deficit.     Sensory: No sensory deficit.     Motor: No weakness.  Psychiatric:        Mood and Affect: Mood normal.        Behavior: Behavior normal.        Thought Content: Thought content normal.      Labs on Admission: I have personally reviewed following labs and imaging studies  CBC: Recent Labs  Lab  11/18/23 2046  WBC 5.5  NEUTROABS 3.2  HGB 10.9*  HCT 33.1*  MCV 99.7  PLT 197   Basic Metabolic Panel: Recent Labs  Lab 11/18/23 2046  NA 142  K 4.6  CL 108  CO2 25  GLUCOSE 116*  BUN 29*  CREATININE 1.45*  CALCIUM  9.7   GFR: Estimated Creatinine Clearance: 42.2 mL/min (A) (by C-G formula based on SCr of 1.45 mg/dL (H)). Liver Function Tests: Recent Labs  Lab 11/18/23 2046  AST 17  ALT 13  ALKPHOS 59  BILITOT 0.4  PROT 7.2  ALBUMIN 3.9   No results for input(s): LIPASE, AMYLASE in the last 168 hours. No results for input(s): AMMONIA in the last 168 hours. Coagulation Profile: Recent Labs  Lab 11/18/23 2046  INR 1.1   Cardiac Enzymes: No results for input(s): CKTOTAL, CKMB, CKMBINDEX, TROPONINI, TROPONINIHS in the last 168 hours. BNP (last 3 results) No results for input(s): BNP in the last 8760 hours. HbA1C: No results for input(s): HGBA1C in the last 72 hours. CBG: No results for input(s): GLUCAP in the last 168 hours. Lipid Profile: No results for input(s): CHOL, HDL, LDLCALC, TRIG, CHOLHDL, LDLDIRECT in the last 72 hours. Thyroid  Function Tests: No results for input(s): TSH, T4TOTAL, FREET4, T3FREE, THYROIDAB in the last 72 hours. Anemia Panel: No results for input(s): VITAMINB12, FOLATE, FERRITIN, TIBC, IRON, RETICCTPCT in the last 72 hours. Urine analysis: No results found for: COLORURINE, APPEARANCEUR, LABSPEC, PHURINE, GLUCOSEU, HGBUR, BILIRUBINUR, KETONESUR, PROTEINUR, UROBILINOGEN, NITRITE, LEUKOCYTESUR  Radiological Exams on Admission: I have personally reviewed images No results found.   EKG:  pending EKG    Assessment/Plan: Principal Problem:   Vertebral compression fracture (HCC) Active Problems:   Hepatocellular carcinoma in adult [facility])   Metastatic cancer to bone [facility])   Essential hypertension   Chronic pain syndrome   Chronic nausea   Acute  kidney injury superimposed on chronic kidney disease (HCC)  Anemia of chronic disease    Assessment and Plan: Hepatocellular carcinoma with known metastases to lumbar vertebra and spinal canal > Patient is a direct admit from neurosurgery clinic for the management of lumbar vertebra and spinal cord compression secondary to metastatic hepatocellular carcinoma. -Patient has been managing by oncology Dr. Lanny outpatient. -CT lumbar spine from 12/70/24 showed known metastatic lesion of L5 with a displaced osseous fragment projecting dorsally from posterior inferior corner and extending 9 mm into the spinal canal. Severe right neural foraminal and moderate spinal canal stenosis at L5-S1. -Patient has been referred from neurosurgery clinic by Dr. Colon to admit for neurosurgical intervention either tonight or tomorrow. -Given last CTA on 12/17 repeating MRI of the lumbar spine tonight as patient possibly will undergo neurosurgical intervention tomorrow. - Obtaining stat CBC, CMP, EKG and echocardiogram. -Reported history of heart murmur since childhood and family history of heart murmur.  Obtain echocardiogram.  Holding blood pressure regimen in the setting of borderline hypotension. -I have informed on-call neurology and will evaluate patient in the AM. -Please reach out to cardiology in the morning for cardiac reassessment for neurosurgical procedure.   History essential hypertension -Patient's blood pressure is soft.  Holding oral blood pressure regimen. -Obtain echocardiogram to assess baseline heart function.  Obtaining EKG.  Nausea secondary to cancer - Continue Zofran  and Compazine  as needed -Continue Reglan .   Chronic pain syndrome cancer -Continue home regimen include hydrocodone  twice daily, oxycodone  as needed for severe pain.  Anemia of chronic disease - CBC showing hemoglobin 10.9.  Baseline hemoglobin around 11-12. - Checking anemia panel and FOBT. -Continue monitor  CBC  Prerenal AKI CKD stage II/IIIa - Creatinine 1.4.  Baseline creatinine around 1.2-1.3. - Prerenal acute kidney injury in the setting of hypotension. - Starting low maintenance fluid with D5 half-normal NS after midnight. -Checking UA, urine creatinine and sodium. - Monitor renal function avoid intoxication  DVT prophylaxis:  SQ Heparin .  SCD. Code Status:  Full Code Diet: Heart healthy diet.  N.p.o. after midnight Family Communication:   Family was present at bedside, at the time of interview. Opportunity was given to ask question and all questions were answered satisfactorily.  Disposition Plan: Per echocardiogram and neurosurgery evaluation Consults: Neurosurgery Admission status:   Inpatient, Telemetry bed  Severity of Illness: The appropriate patient status for this patient is INPATIENT. Inpatient status is judged to be reasonable and necessary in order to provide the required intensity of service to ensure the patient's safety. The patient's presenting symptoms, physical exam findings, and initial radiographic and laboratory data in the context of their chronic comorbidities is felt to place them at high risk for further clinical deterioration. Furthermore, it is not anticipated that the patient will be medically stable for discharge from the hospital within 2 midnights of admission.   * I certify that at the point of admission it is my clinical judgment that the patient will require inpatient hospital care spanning beyond 2 midnights from the point of admission due to high intensity of service, high risk for further deterioration and high frequency of surveillance required.DEWAINE    Woodie Degraffenreid, MD [facility]  How to contact the [facility] Attending or Consulting provider 7A - 7P or covering provider during after hours 7P -7A, for this patient.  Check the care team in [facility] and look for a) attending/consulting [facility] provider listed and b) the [facility] team listed Log into [email or web address]  and use [place]'s universal password to access. If you do not have  the password, please contact the hospital operator. Locate the [facility] provider you are looking for under [facility] and page to a number that you can be directly reached. If you still have difficulty reaching the provider, please page the [facility] (Director on Call) for the Hospitalists listed on amion for assistance.  11/19/2023, 1:58 AM

## 2023-11-18 NOTE — Progress Notes (Deleted)
 Palliative Medicine [facility]  [phone] [phone]   Name: Corey Ragsdale Sr. Date: 11/18/2023 MRN: [id number]  DOB: [birth year]  Patient Care Team: [name], MD as PCP - General (Internal Medicine) [name], MD as Attending Physician (Hematology and Oncology) [name], [name], NP as Nurse Practitioner (Hospice and Palliative Medicine)   INTERVAL HISTORY: Corey Schoolfield Sr. is a 68 y.o. 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  Palliative ask to see for symptom and pain management and goals of care.   SOCIAL HISTORY:     reports that he has quit smoking. He has been exposed to tobacco smoke. He has never used smokeless tobacco. He reports that he does not currently use alcohol. He reports that he does not use drugs.  ADVANCE DIRECTIVES:  None on file  CODE STATUS: Full code  PAST MEDICAL HISTORY: Past Medical History:  Diagnosis Date   Headache    migraines   Hepatitis    HX OF hEP c ?    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    ALLERGIES:  has no known allergies.  MEDICATIONS:  Current Outpatient Medications  Medication Sig Dispense Refill   gabapentin  (NEURONTIN ) 300 MG capsule Take 2 capsules (600 mg) in the morning, 2 capsules (600mg ) midday, 3 capsules (900 mg) at bedtime. 210 capsule 1   HYDROcodone  Bitartrate ER 20 MG CP12 Take 1 capsule by mouth every 12 (twelve) hours. 60 capsule 0   ibuprofen  (ADVIL ) 100 MG tablet Take 100 mg by mouth every 6 (six) hours as needed for fever.     losartan -hydrochlorothiazide (HYZAAR) 100-25 MG tablet TAKE 1 TABLET BY MOUTH EVERY DAY 90 tablet 3   metoCLOPramide  (REGLAN ) 10 MG tablet Take 1 tablet (10 mg total) by mouth 4 (four) times daily. 90 tablet 2   ondansetron  (ZOFRAN ) 8 MG tablet Take 1 tablet (8 mg total) by mouth every 8 (eight)  hours as needed for nausea or vomiting. 30 tablet 3   ondansetron  (ZOFRAN -ODT) 4 MG disintegrating tablet Take 1 tablet (4 mg total) by mouth every 8 (eight) hours as needed for nausea or vomiting. 20 tablet 0   oxyCODONE  (OXY IR/ROXICODONE ) 5 MG immediate release tablet Take 1-2 tablets (5-10 mg total) by mouth every 6 (six) hours as needed for severe pain (pain score 7-10). 45 tablet 0   pantoprazole  (PROTONIX ) 40 MG tablet Take 1 tablet (40 mg total) by mouth daily. 30 tablet 3   prochlorperazine  (COMPAZINE ) 10 MG tablet Take 1 tablet (10 mg total) by mouth every 6 (six) hours as needed for nausea or vomiting. 30 tablet 0   No current facility-administered medications for this visit.    VITAL SIGNS: There were no vitals taken for this visit. There were no vitals filed for this visit.  Estimated body mass index is 18.13 kg/m as calculated from the following:   Height as of 11/07/23: 5' 11 (1.803 m).   Weight as of 11/07/23: 130 lb (59 kg).   PERFORMANCE STATUS (ECOG) : 1 - Symptomatic but completely ambulatory  Assessment NAD, resting in recliner RRR Normal breathing pattern Alert and oriented x3  Discussed the use of AI scribe software for clinical note transcription with the patient, who gave verbal consent to proceed.   IMPRESSION:  Corey Armstrong presents to clinic today for symptom management follow-up. No acute distress. His  wife [name] is present. Patient is in wheelchair. He is taking things one day at a time. The patient has been experiencing constipation, which has not been relieved by a stool softener. Despite this, he reports a good appetite. However, he has lost weight, dropping from 135 lbs. in September to 127 lbs. at the time of today's visit.   He reports recently prescribed Zofran  sublingually. This medication has been effective in preventing vomiting. He also takes Compazine  for nausea, for which he needs a refill. Confirms he is takin his Protonix  and Reglan  also  which has also been effective in aiding his nausea and indigestion.   Wife reports slow but noticeable improvement in his appetite. She and daughter are pushing protein drinks in between meals.   We discussed his current pain regimen, reports that his current medications are effective. However, he anticipates needing more medication before his next appointment. He is currently taking Hydrocodone  ER, Oxycodone  IR as needed for breakthrough pain, and gabapentin  three times daily. Tolerating all medications without difficulty. The patient has been experiencing difficulty obtaining hydrocodone  from his pharmacy, which has caused him to travel a significant distance to fill his prescription. I advised to contact office at least 3 days prior to running out to allow time for pharmacy to ensure medications are in stock or ordered.   The patient has a tumor, which is causing leg pain and difficulty lifting his foot. He has been researching medications online for sciatica, but it is unclear if he has started any new treatments. He has previously undergone radiation therapy for the tumor, and there is discussion of possibly pursuing more radiation treatment as discussed with Dr. Lanny. Corey Armstrong is aware that he should not start any new medications without first consulting with his medical team regardless of if medication is over the counter.  Goals of care   8/28- We discussed his current illness and what it means in the larger context of his on-going co-morbidities. Natural disease trajectory and expectations were discussed.   He is realistic in his understanding of his cancer diagnosis and current treatment plan.  Corey Armstrong is clear and his expressed wishes to continue to treat the treatable allow him every opportunity to continue to thrive while managing his symptoms alone for a decent quality of life.  We discussed Her current illness and what it means in the larger context of Her on-going co-morbidities.  Natural disease trajectory and expectations were discussed.  I discussed the importance of continued conversation with family and their medical providers regarding overall plan of care and treatment options, ensuring decisions are within the context of the patients values and GOCs.  PLAN:  Assessment & Plan Cancer Pain Management Patient reports adequate pain control with current regimen of hydrocodone  and oxycodone . Concerns about pharmacy stock and ensuring adequate supply until next visit. -Refill hydrocodone  ER and oxycodone  prescriptions to last until next visit in January. -Continue Gabapentin  as prescribed -Advise patient to contact office 2-3 days prior to running out of medication.  Gastroesophageal Reflux Disease (GERD) Patient on Protonix  and Reglan , with refills available. -Continue current regimen.  Nausea/Vomiting Patient reports benefit from Zofran  ODT, but supply runs out quickly. Possible insurance restrictions due to ODT formulation. -Continue Zofran  ODT as needed, up to 3 times a day. -Consider alternative antiemetics if insurance restrictions continue.  Constipation Patient reports limited benefit from stool softener. -Continue current regimen. -Consider alternative constipation management strategies if no improvement.  Weight Loss Patient has lost weight since last  visit but reports good appetite. -Monitor weight and nutritional intake. -Consider referral to dietitian if weight loss continues.  Follow-up - I will plan to see patient back in 3-4 weeks in collaboration with next Oncology visit.  Patient expressed understanding and was in agreement with this plan. He also understands that He can call the clinic at any time with any questions, concerns, or complaints.   Any controlled substances utilized were prescribed in the context of palliative care. PDMP has been reviewed.   Visit consisted of counseling and education dealing with the complex and  emotionally intense issues of symptom management and palliative care in the setting of serious and potentially life-threatening illness.  Levon Borer, AGPCNP-BC  Palliative Medicine Team/[facility]

## 2023-11-18 NOTE — Telephone Encounter (Signed)
 I spoke with Mr. Corey Armstrong about his surgical clearance and the desire to complete this process as an IP in order to move forward with surgery with Dr. Colon. Mr. Corey Armstrong agreed and would like to move forward with direct admission to [facility] hospital for further work up.  (Echocardiogram currently scheduled on 1/15) He understands that someone from the Hospitalist service is going to call him later today with details for his arrival.   Mr. Corey Armstrong has appointments scheduled at that [facility] Wed 1/8. I sent a message to Dr. Lanny to make her aware of the plans for admission and surgery.   Corey Armstrong R.T.(R)(T) Radiation Special Procedures Navigator

## 2023-11-19 ENCOUNTER — Inpatient Hospital Stay: Payer: Commercial Managed Care - PPO

## 2023-11-19 ENCOUNTER — Other Ambulatory Visit: Payer: Self-pay | Admitting: Neurological Surgery

## 2023-11-19 ENCOUNTER — Inpatient Hospital Stay (HOSPITAL_COMMUNITY): Payer: Commercial Managed Care - PPO

## 2023-11-19 ENCOUNTER — Other Ambulatory Visit: Payer: Self-pay

## 2023-11-19 ENCOUNTER — Inpatient Hospital Stay: Payer: Commercial Managed Care - PPO | Admitting: Hematology

## 2023-11-19 ENCOUNTER — Inpatient Hospital Stay: Payer: Commercial Managed Care - PPO | Admitting: Nurse Practitioner

## 2023-11-19 DIAGNOSIS — C220 Liver cell carcinoma: Secondary | ICD-10-CM

## 2023-11-19 DIAGNOSIS — R011 Cardiac murmur, unspecified: Secondary | ICD-10-CM | POA: Diagnosis not present

## 2023-11-19 DIAGNOSIS — S32050D Wedge compression fracture of fifth lumbar vertebra, subsequent encounter for fracture with routine healing: Secondary | ICD-10-CM | POA: Diagnosis not present

## 2023-11-19 DIAGNOSIS — I350 Nonrheumatic aortic (valve) stenosis: Secondary | ICD-10-CM

## 2023-11-19 DIAGNOSIS — C7951 Secondary malignant neoplasm of bone: Secondary | ICD-10-CM

## 2023-11-19 LAB — CBC
HCT: 30.4 % — ABNORMAL LOW (ref 39.0–52.0)
Hemoglobin: 9.9 g/dL — ABNORMAL LOW (ref 13.0–17.0)
MCH: 32.2 pg (ref 26.0–34.0)
MCHC: 32.6 g/dL (ref 30.0–36.0)
MCV: 99 fL (ref 80.0–100.0)
Platelets: 184 10*3/uL (ref 150–400)
RBC: 3.07 MIL/uL — ABNORMAL LOW (ref 4.22–5.81)
RDW: 12.9 % (ref 11.5–15.5)
WBC: 4.2 10*3/uL (ref 4.0–10.5)
nRBC: 0 % (ref 0.0–0.2)

## 2023-11-19 LAB — ECHOCARDIOGRAM COMPLETE
AR max vel: 1.92 cm2
AV Area VTI: 0.85 cm2
AV Area mean vel: 0.86 cm2
AV Mean grad: 41 mm[Hg]
AV Peak grad: 17.3 mm[Hg]
Ao pk vel: 2.08 m/s
Area-P 1/2: 3.06 cm2
Calc EF: 52.2 %
Height: 67 in
S' Lateral: 3.1 cm
Single Plane A2C EF: 54.2 %
Single Plane A4C EF: 52.4 %
Weight: 2068.8 [oz_av]

## 2023-11-19 LAB — COMPREHENSIVE METABOLIC PANEL
ALT: 13 U/L (ref 0–44)
AST: 15 U/L (ref 15–41)
Albumin: 3.4 g/dL — ABNORMAL LOW (ref 3.5–5.0)
Alkaline Phosphatase: 49 U/L (ref 38–126)
Anion gap: 8 (ref 5–15)
BUN: 32 mg/dL — ABNORMAL HIGH (ref 8–23)
CO2: 26 mmol/L (ref 22–32)
Calcium: 9.4 mg/dL (ref 8.9–10.3)
Chloride: 107 mmol/L (ref 98–111)
Creatinine, Ser: 1.39 mg/dL — ABNORMAL HIGH (ref 0.61–1.24)
GFR, Estimated: 56 mL/min — ABNORMAL LOW (ref >60)
Glucose, Bld: 93 mg/dL (ref 70–99)
Potassium: 3.9 mmol/L (ref 3.5–5.1)
Sodium: 141 mmol/L (ref 135–145)
Total Bilirubin: 0.4 mg/dL (ref 0.0–1.2)
Total Protein: 6.4 g/dL — ABNORMAL LOW (ref 6.5–8.1)

## 2023-11-19 MED ORDER — MUPIROCIN 2 % EX OINT
1.0000 | TOPICAL_OINTMENT | Freq: Two times a day (BID) | CUTANEOUS | Status: DC
  Administered 2023-11-19 – 2023-11-20 (×2): 1 via NASAL
  Filled 2023-11-19 (×2): qty 22

## 2023-11-19 MED ORDER — GADOBUTROL 1 MMOL/ML IV SOLN
6.0000 mL | Freq: Once | INTRAVENOUS | Status: AC | PRN
  Administered 2023-11-19: 6 mL via INTRAVENOUS

## 2023-11-19 NOTE — Consult Note (Signed)
 Cardiology Consultation   Patient ID: Corey Tarri Sierra Sr. MRN: [id number]; DOB: May 17, 1956  Admit date: 11/18/2023 Date of Consult: 11/19/2023  PCP:  [name], MD   [facility] Providers Cardiologist:  None   {  Patient Profile:   Corey Tarri Sierra Sr. is a 68 y.o. 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 Thayer office (neurosurgery) for potential surgical fixation of pathological L5 fracture  who is being seen 11/19/2023 for the pre-op evaluation regarding severe aortic stenosis found on echocardiogram at the request of Dr. Judeth.  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 Thayer office (neurosurgery) for potential surgical fixation of pathological L5 fracture. Patient was sent to the hospital as a direct admission for neurosurgical intervention for management of lumbar vertebra and spinal cord compression secondary to metastatic hepatocellular carcinoma.  Previously charted physical exam states that the patient has right sided back pain which radiates to his right side upper and lower extremities.  Patient denied any associated tingling or numbness.  Denies any bowel or bladder incontinence.  Patient has a history of hypertension.  Reports he is compliant with his current regimen of  losartan -hydrochlorothiazide 100-25 mg daily. patient had reported a history of a heart murmur that he claims was present since childhood but admits he was never fully evaluated by cardiology. Patient reportedly has been experiencing dyspnea on exertion for the last 3-4 months so prior to surgery his primary team ordered an echocardiogram.   Echocardiogram was completed  on 11/19/2023 and showed the following: LVEF of 60 to 65%, no regional wall motion abnormalities, moderate concentric left ventricular hypertrophy, grade 2 diastolic dysfunction.  RV function and size normal.  Normal pulmonary artery systolic pressure.  Mildly dilated left atrium.  Mild mitral valve regurgitation.  Calcified aortic valve. Severe aortic valve stenosis.  Normal IVC.  Recommendations after this cath was a cardiac CT for his severe aortic stenosis. Cardiology was then consulted.  After speaking with and examining the patient he says that over the last couple of months he has been experiencing some increased fatigue when doing tasks he used to do well. He admits that with his cancer diagnosis and mets to his spine that he has not been as mobile as he used to. He believed that most of these symptoms that came on were do to general deconditioning. Patient denies any episodes of chest pain, syncope or near syncope. He says that this increased fatigue is not limiting to him on a daily basis just something he started noticing in the last couple of months.   Past Medical History:  Diagnosis Date   Headache    migraines   Hepatitis    HX OF hEP c ?    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   Past Surgical History:  Procedure Laterality Date   IR RADIOLOGIST EVAL & MGMT  01/25/2020   IR RADIOLOGIST EVAL & MGMT  03/21/2020   IR RADIOLOGIST EVAL & MGMT  06/07/2020   IR RADIOLOGIST EVAL & MGMT  09/05/2020   IR RADIOLOGIST EVAL & MGMT  12/05/2020   IR RADIOLOGIST EVAL & MGMT  05/09/2021   IR RADIOLOGIST EVAL &  MGMT  09/06/2021   IR RADIOLOGIST EVAL & MGMT  12/12/2021   IR RADIOLOGIST EVAL & MGMT  06/25/2022   IR RADIOLOGIST EVAL & MGMT  07/24/2022   RADIOLOGY WITH ANESTHESIA N/A 03/01/2020   Procedure: CT WITH ANESTHESIA  THERMAL MICROWAVE  ABLATIION;  Surgeon: Karalee Beat, MD;  Location: [facility];  Service: Anesthesiology;  Laterality: N/A;   RADIOLOGY WITH  ANESTHESIA N/A 07/17/2022   Procedure: CT MICROWAVE ABLATION;  Surgeon: Karalee Beat POUR, MD;  Location: [facility];  Service: Radiology;  Laterality: N/A;    Home Medications:  Prior to Admission medications   Medication Sig Start Date End Date Taking? Authorizing Provider  gabapentin  (NEURONTIN ) 300 MG capsule Take 2 capsules (600 mg) in the morning, 2 capsules (600mg ) midday, 3 capsules (900 mg) at bedtime. 10/01/23  Yes [name], [name], NP  HYDROcodone  Bitartrate ER 20 MG CP12 Take 1 capsule by mouth every 12 (twelve) hours. 11/13/23  Yes [name], [name], NP  ibuprofen  (ADVIL ) 100 MG tablet Take 100 mg by mouth every 6 (six) hours as needed for fever.   Yes [provider]  ondansetron  (ZOFRAN -ODT) 4 MG disintegrating tablet Take 1 tablet (4 mg total) by mouth every 8 (eight) hours as needed for nausea or vomiting. 10/22/23  Yes [name], MD  oxyCODONE  (OXY IR/ROXICODONE ) 5 MG immediate release tablet Take 1-2 tablets (5-10 mg total) by mouth every 6 (six) hours as needed for severe pain (pain score 7-10). 10/22/23  Yes [name], [name], NP  pantoprazole  (PROTONIX ) 40 MG tablet Take 1 tablet (40 mg total) by mouth daily. 09/24/23  Yes [name], [name], NP  prochlorperazine  (COMPAZINE ) 10 MG tablet Take 1 tablet (10 mg total) by mouth every 6 (six) hours as needed for nausea or vomiting. 10/22/23  Yes [name], [name], NP  losartan -hydrochlorothiazide (HYZAAR) 100-25 MG tablet TAKE 1 TABLET BY MOUTH EVERY DAY Patient not taking: Reported on 11/19/2023 07/16/23   Letvak, Richard I, MD  metoCLOPramide  (REGLAN ) 10 MG tablet Take 1 tablet (10 mg total) by mouth 4 (four) times daily. Patient not taking: Reported on 11/19/2023 10/07/23   [name], Athena N, NP  ondansetron  (ZOFRAN ) 8 MG tablet Take 1 tablet (8 mg total) by mouth every 8 (eight) hours as needed for nausea or vomiting. Patient not taking: Reported on 11/19/2023 08/27/23    Burton, Lacie K, NP   Inpatient Medications: Scheduled Meds:  heparin   5,000 Units Subcutaneous Q8H   metoCLOPramide   10 mg Oral QID   sodium chloride  flush  3 mL Intravenous Q12H   Continuous Infusions:  sodium chloride      PRN Meds: sodium chloride , ondansetron  (ZOFRAN ) IV, oxyCODONE , prochlorperazine , sodium chloride  flush  Allergies:   No Known Allergies  Social History:   Social History   Socioeconomic History   Marital status: Divorced    Spouse name: Not on file   Number of children: 2   Years of education: 12   Highest education level: 12th grade  Occupational History   Occupation: Builds Product/process Development Scientist: [employer]    Comment: Out on disability for now  Tobacco Use   Smoking status: Former    Passive exposure: Current   Smokeless tobacco: Never   Tobacco comments:    3 per week  Vaping Use   Vaping status: Never Used  Substance and Sexual Activity   Alcohol use: Not Currently   Drug use: No   Sexual activity: Not Currently  Other Topics Concern  Not on file  Social History Narrative   Divorced but has relationship with ex   2 children   Daughter lives with him   Son in [place]      No living will   Ex wife Myraette should make decisions on his behalf   Would accept resuscitation   Not sure about tube feeds   Social Drivers of Health   Financial Resource Strain: Medium Risk (11/06/2023)   Overall Financial Resource Strain (CARDIA)    Difficulty of Paying Living Expenses: Somewhat hard  Food Insecurity: No Food Insecurity (11/18/2023)   Hunger Vital Sign    Worried About Running Out of Food in the Last Year: Never true    Ran Out of Food in the Last Year: Never true  Transportation Needs: No Transportation Needs (11/18/2023)   PRAPARE - Administrator, Civil Service (Medical): No    Lack of Transportation (Non-Medical): No  Physical Activity: Sufficiently Active (11/06/2023)   Exercise Vital Sign    Days of Exercise per  Week: 7 days    Minutes of Exercise per Session: 30 min  Stress: No Stress Concern Present (11/06/2023)   [employer] of Occupational Health - Occupational Stress Questionnaire    Feeling of Stress : Only a little  Social Connections: Moderately Integrated (11/18/2023)   Social Connection and Isolation Panel [NHANES]    Frequency of Communication with Friends and Family: Three times a week    Frequency of Social Gatherings with Friends and Family: More than three times a week    Attends Religious Services: More than 4 times per year    Active Member of [employer] or Organizations: Yes    Attends Engineer, Structural: More than 4 times per year    Marital Status: Divorced  Intimate Partner Violence: Not At Risk (11/18/2023)   Humiliation, Afraid, Rape, and Kick questionnaire    Fear of Current or Ex-Partner: No    Emotionally Abused: No    Physically Abused: No    Sexually Abused: No    Family History:   Family History  Problem Relation Age of Onset   Arthritis Mother    Cancer Neg Hx    Diabetes Neg Hx    Drug abuse Neg Hx    Heart disease Neg Hx    Hyperlipidemia Neg Hx    Hypertension Neg Hx    Kidney disease Neg Hx    Stroke Neg Hx     ROS:  Please see the history of present illness.  All other ROS reviewed and negative.     Physical Exam/Data:   Vitals:   11/19/23 0412 11/19/23 0746 11/19/23 1221 11/19/23 1525  BP: 112/60 (!) 136/50 (!) 121/41 (!) 135/48  Pulse: 60 60 60 64  Resp: 18 18 18 18   Temp: 97.9 F (36.6 C) 98.5 F (36.9 C) 98.5 F (36.9 C) 98.8 F (37.1 C)  TempSrc: Oral Oral Oral Oral  SpO2: 97% 100% 100% 100%  Weight:      Height:        Intake/Output Summary (Last 24 hours) at 11/19/2023 1608 Last data filed at 11/19/2023 0800 Gross per 24 hour  Intake 744.19 ml  Output --  Net 744.19 ml      11/18/2023    7:51 PM 11/07/2023    9:56 AM 10/22/2023   11:49 AM  Last 3 Weights  Weight (lbs) 129 lb 4.8 oz 130 lb 127 lb 4.8 oz  Weight  (kg) 58.65 kg  58.968 kg 57.743 kg     Body mass index is 20.25 kg/m.  General:  pleasant, thin male, sitting up in the bed, in no acute distress HEENT: normal Neck: no JVD Vascular: Distal pulses 2+ bilaterally Cardiac:  normal S1, S2; RRR; 3/6 systolic murmur  Lungs:  clear to auscultation bilaterally, no wheezing, rhonchi or rales  Abd: soft, nontender Ext: no edema Musculoskeletal:  No deformities Skin: warm and dry  Neuro:  no focal abnormalities noted Psych:  Normal affect   EKG:  The EKG was personally reviewed and demonstrates: Sinus rhythm HR 65  Relevant CV Studies: Echocardiogram (11/19/2023) IMPRESSIONS   1. Left ventricular ejection fraction, by estimation, is 60 to 65%. The  left ventricle has normal function. The left ventricle has no regional  wall motion abnormalities. There is moderate concentric left ventricular  hypertrophy. Left ventricular  diastolic parameters are consistent with Grade II diastolic dysfunction  (pseudonormalization).   2. Right ventricular systolic function is normal. The right ventricular  size is normal. There is normal pulmonary artery systolic pressure.   3. Left atrial size was mildly dilated.   4. Mild mitral valve regurgitation.   5. Unable to determine if this is a bicupsid valve. AI directed towards  the anterior leaflet. The aortic valve is calcified. Aortic valve  regurgitation is moderate. Severe aortic valve stenosis. Aortic valve  area, by VTI measures 0.85 cm. Aortic valve  mean gradient measures 41.0 mmHg.   6. The inferior vena cava is normal in size with greater than 50%  respiratory variability, suggesting right atrial pressure of 3 mmHg.   Conclusion(s)/Recommendation(s): Recommend cardiac CT for severe AS.   FINDINGS   Left Ventricle: Left ventricular ejection fraction, by estimation, is 60  to 65%. The left ventricle has normal function. The left ventricle has no  regional wall motion abnormalities. The left  ventricular internal cavity  size was normal in size. There is   moderate concentric left ventricular hypertrophy. Left ventricular  diastolic parameters are consistent with Grade II diastolic dysfunction  (pseudonormalization).   Right Ventricle: The right ventricular size is normal. Right ventricular  systolic function is normal. There is normal pulmonary artery systolic  pressure. The tricuspid regurgitant velocity is 2.32 m/s, and with an  assumed right atrial pressure of 3 mmHg,   the estimated right ventricular systolic pressure is 24.5 mmHg.   Left Atrium: Left atrial size was mildly dilated.   Right Atrium: Right atrial size was normal in size.   Pericardium: There is no evidence of pericardial effusion.   Mitral Valve: Mild mitral annular calcification. Mild mitral valve  regurgitation.   Tricuspid Valve: Tricuspid valve regurgitation is mild.   Aortic Valve: Unable to determine if this is a bicupsid valve. AI directed  towards the anterior leaflet. The aortic valve is calcified. Aortic valve  regurgitation is moderate. Severe aortic stenosis is present. Aortic valve  mean gradient measures 41.0  mmHg. Aortic valve peak gradient measures 17.3 mmHg. Aortic valve area, by  VTI measures 0.85 cm.   Pulmonic Valve: Pulmonic valve regurgitation is trivial.   Aorta: The aortic root and ascending aorta are structurally normal, with  no evidence of dilitation.   Venous: The inferior vena cava is normal in size with greater than 50%  respiratory variability, suggesting right atrial pressure of 3 mmHg.   IAS/Shunts: No atrial level shunt detected by color flow Doppler.   Laboratory Data:  High Sensitivity Troponin:  No results for input(s): TROPONINIHS in  the last 720 hours.   Chemistry Recent Labs  Lab 11/18/23 2046 11/19/23 0542  NA 142 141  K 4.6 3.9  CL 108 107  CO2 25 26  GLUCOSE 116* 93  BUN 29* 32*  CREATININE 1.45* 1.39*  CALCIUM  9.7 9.4  GFRNONAA 53*  56*  ANIONGAP 9 8    Recent Labs  Lab 11/18/23 2046 11/19/23 0542  PROT 7.2 6.4*  ALBUMIN 3.9 3.4*  AST 17 15  ALT 13 13  ALKPHOS 59 49  BILITOT 0.4 0.4   Lipids No results for input(s): CHOL, TRIG, HDL, LABVLDL, LDLCALC, CHOLHDL in the last 168 hours.  Hematology Recent Labs  Lab 11/18/23 2046 11/19/23 0542  WBC 5.5 4.2  RBC 3.32* 3.07*  HGB 10.9* 9.9*  HCT 33.1* 30.4*  MCV 99.7 99.0  MCH 32.8 32.2  MCHC 32.9 32.6  RDW 12.8 12.9  PLT 197 184   Thyroid  No results for input(s): TSH, FREET4 in the last 168 hours.  BNPNo results for input(s): BNP, PROBNP in the last 168 hours.  DDimer No results for input(s): DDIMER in the last 168 hours.  Radiology/Studies:  ECHOCARDIOGRAM COMPLETE Result Date: 11/19/2023    ECHOCARDIOGRAM REPORT   Patient Name:   Corey Millspaugh Sr. Date of Exam: 11/19/2023 Medical Rec #:  [id number]                  Height:       67.0 in Accession #:    [id number]                 Weight:       129.3 lb Date of Birth:  September 30, 1956                   BSA:          1.680 m Patient Age:    67 years                   BP:           136/50 mmHg Patient Gender: M                          HR:           53 bpm. Exam Location:  Inpatient Procedure: 2D Echo, Color Doppler and Cardiac Doppler Indications:    murmur  History:        Patient has no prior history of Echocardiogram examinations.                 Risk Factors:Hypertension.  Sonographer:    Melissa Morford RDCS (AE, PE) Referring Phys: [id number] ANAND D HONGALGI IMPRESSIONS  1. Left ventricular ejection fraction, by estimation, is 60 to 65%. The left ventricle has normal function. The left ventricle has no regional wall motion abnormalities. There is moderate concentric left ventricular hypertrophy. Left ventricular diastolic parameters are consistent with Grade II diastolic dysfunction (pseudonormalization).  2. Right ventricular systolic function is normal. The right ventricular size is normal.  There is normal pulmonary artery systolic pressure.  3. Left atrial size was mildly dilated.  4. Mild mitral valve regurgitation.  5. Unable to determine if this is a bicupsid valve. AI directed towards the anterior leaflet. The aortic valve is calcified. Aortic valve regurgitation is moderate. Severe aortic valve stenosis. Aortic valve area, by VTI measures 0.85 cm. Aortic valve mean gradient measures 41.0 mmHg.  6. The inferior vena cava is  normal in size with greater than 50% respiratory variability, suggesting right atrial pressure of 3 mmHg. Conclusion(s)/Recommendation(s): Recommend cardiac CT for severe AS. FINDINGS  Left Ventricle: Left ventricular ejection fraction, by estimation, is 60 to 65%. The left ventricle has normal function. The left ventricle has no regional wall motion abnormalities. The left ventricular internal cavity size was normal in size. There is  moderate concentric left ventricular hypertrophy. Left ventricular diastolic parameters are consistent with Grade II diastolic dysfunction (pseudonormalization). Right Ventricle: The right ventricular size is normal. Right ventricular systolic function is normal. There is normal pulmonary artery systolic pressure. The tricuspid regurgitant velocity is 2.32 m/s, and with an assumed right atrial pressure of 3 mmHg,  the estimated right ventricular systolic pressure is 24.5 mmHg. Left Atrium: Left atrial size was mildly dilated. Right Atrium: Right atrial size was normal in size. Pericardium: There is no evidence of pericardial effusion. Mitral Valve: Mild mitral annular calcification. Mild mitral valve regurgitation. Tricuspid Valve: Tricuspid valve regurgitation is mild. Aortic Valve: Unable to determine if this is a bicupsid valve. AI directed towards the anterior leaflet. The aortic valve is calcified. Aortic valve regurgitation is moderate. Severe aortic stenosis is present. Aortic valve mean gradient measures 41.0 mmHg. Aortic valve peak  gradient measures 17.3 mmHg. Aortic valve area, by VTI measures 0.85 cm. Pulmonic Valve: Pulmonic valve regurgitation is trivial. Aorta: The aortic root and ascending aorta are structurally normal, with no evidence of dilitation. Venous: The inferior vena cava is normal in size with greater than 50% respiratory variability, suggesting right atrial pressure of 3 mmHg. IAS/Shunts: No atrial level shunt detected by color flow Doppler.  LEFT VENTRICLE PLAX 2D LVIDd:         4.90 cm      Diastology LVIDs:         3.10 cm      LV e' medial:    6.31 cm/s LV PW:         1.20 cm      LV E/e' medial:  15.4 LV IVS:        1.20 cm      LV e' lateral:   7.07 cm/s LVOT diam:     2.00 cm      LV E/e' lateral: 13.7 LV SV:         89 LV SV Index:   53 LVOT Area:     3.14 cm  LV Volumes (MOD) LV vol d, MOD A2C: 112.0 ml LV vol d, MOD A4C: 123.0 ml LV vol s, MOD A2C: 51.3 ml LV vol s, MOD A4C: 58.6 ml LV SV MOD A2C:     60.7 ml LV SV MOD A4C:     123.0 ml LV SV MOD BP:      62.5 ml RIGHT VENTRICLE RV S prime:     12.40 cm/s TAPSE (M-mode): 2.6 cm LEFT ATRIUM           Index        RIGHT ATRIUM           Index LA diam:      3.60 cm 2.14 cm/m   RA Area:     15.00 cm LA Vol (A4C): 59.3 ml 35.30 ml/m  RA Volume:   41.90 ml  24.94 ml/m  AORTIC VALVE AV Area (Vmax):    1.92 cm AV Area (Vmean):   0.86 cm AV Area (VTI):     0.85 cm AV Vmax:  207.94 cm/s AV Vmean:          300.000 cm/s AV VTI:            1.050 m AV Peak Grad:      17.3 mmHg AV Mean Grad:      41.0 mmHg LVOT Vmax:         127.00 cm/s LVOT Vmean:        81.900 cm/s LVOT VTI:          0.284 m LVOT/AV VTI ratio: 0.27  AORTA Ao Root diam: 3.60 cm Ao Asc diam:  3.40 cm MITRAL VALVE               TRICUSPID VALVE MV Area (PHT): 3.06 cm    TR Peak grad:   21.5 mmHg MV Decel Time: 248 msec    TR Vmax:        232.00 cm/s MV E velocity: 96.90 cm/s MV A velocity: 82.60 cm/s  SHUNTS MV E/A ratio:  1.17        Systemic VTI:  0.28 m                            Systemic Diam:  2.00 cm Ronal Ross Electronically signed by Ronal Ross Signature Date/Time: 11/19/2023/1:17:02 PM    Final    MR Lumbar Spine W Wo Contrast Result Date: 11/19/2023 CLINICAL DATA:  Initial evaluation for compression fracture, known malignancy. EXAM: MRI LUMBAR SPINE WITHOUT AND WITH CONTRAST TECHNIQUE: Multiplanar and multiecho pulse sequences of the lumbar spine were obtained without and with intravenous contrast. CONTRAST:  6mL GADAVIST  GADOBUTROL  1 MMOL/ML IV SOLN COMPARISON:  Prior CT from 10/28/2023 and MRI from 09/24/2023. FINDINGS: Segmentation: Standard. Lowest well-formed disc space labeled the L5-S1 level. Alignment: Trace dextroscoliosis. Trace degenerative anterolisthesis of L2 on L3. Vertebrae: Previously identified metastatic lesion involving the posterior aspect of the L5 vertebral body again seen, not significantly changed in size or appearance since previous. Associated pathologic fracture with osseous fragment posterior to the L5 vertebral body, better appreciated on recent CT. Epidural extension with tumor extending into the ventral epidural space, little interval changed. No new metastatic lesions within the lumbar spine. Vertebral body height otherwise maintained. Bone marrow signal intensity otherwise normal. No evidence for osteomyelitis discitis or septic arthritis. Conus medullaris and cauda equina: Conus extends to the L1 level. Conus and cauda equina appear normal. Paraspinal and other soft tissues: Paraspinous soft tissues demonstrate no acute finding. Multiple scattered T2 hyperintense cyst noted about the visualized kidneys, benign in appearance, no follow-up imaging recommended. Disc levels: L1-2:  Unremarkable. L2-3: Trace anterolisthesis. Disc desiccation with mild disc bulge and reactive endplate spurring. Mild right greater than left facet hypertrophy with small joint effusions. No significant spinal stenosis. Mild bilateral L2 foraminal narrowing. L3-4: Disc desiccation with  minimal disc bulge. Superimposed small left foraminal disc protrusion closely approximates the exiting left L3 nerve root (series 8, image 22). Mild facet spurring. No spinal stenosis. Mild bilateral L3 foraminal stenosis. L4-5: Disc desiccation with mild disc bulge. Mild facet and ligament flavum hypertrophy with trace joint effusions. No significant spinal stenosis. Mild bilateral L4 foraminal narrowing. L5-S1: Metastatic lesion with epidural extension of tumor into the ventral epidural space. Superimposed mild bilateral facet hypertrophy. Resultant moderate canal with moderate to severe bilateral subarticular stenosis. Mild left with moderate right L5 foraminal narrowing. Overall appearance is similar to previous MRI. IMPRESSION: 1. No significant interval change in size or appearance  of known metastatic lesion involving the posterior aspect of the L5 vertebral body. Associated pathologic fracture fragment better appreciated on recent CT. Epidural extension of tumor into the ventral epidural space with resultant moderate canal with moderate to severe bilateral subarticular stenosis. 2. No new metastatic disease within the lumbar spine. 3. Mild spondylosis elsewhere within the lumbar spine as above. No other significant spinal stenosis. Mild bilateral L2 through L4 foraminal narrowing, with mild left and moderate right L5 foraminal stenosis. Electronically Signed   By: Morene Hoard M.D.   On: 11/19/2023 05:15   Assessment and Plan:   Severe aortic stenosis Mild mitral valve regurgitation Echo from 11/19/2023 and showed: LVEF of 60 to 65%, no regional wall motion abnormalities, moderate concentric left ventricular hypertrophy, grade 2 diastolic dysfunction.  RV function and size normal.  Normal pulmonary artery systolic pressure.  Mildly dilated left atrium.  Mild mitral valve regurgitation.  Calcified aortic valve. Severe aortic valve stenosis.  Normal IVC.   It was recommended that the patient  undergo a cardiac CT for further workup of his severe aortic stenosis Patient denies no chest pain, syncope or near syncopal episodes. He states that he has experienced some increased fatigue in the last couple of months but states it could be related to deconditoning secondary to decreased activity levels caused by chronic pain related to his cancer as well as mets to his spine Patient appears asymptomatic and euvolemic on exam -- After speaking with MD, decision that proceeding with his scheduled neurosurgery for his spinal cord stenosis is the next best move. -- Considering his current diagnoses and principal issue being his pathological fracture leading to his pain and stenosis spinal cord stenosis, his severe aortic stenosis does not prevent him from having this surgery -- Anaesthesia will be made aware of his diagnosis of aortic stenosis to ensure patient remains hemodynamically stable during surgery.   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

## 2023-11-19 NOTE — Progress Notes (Addendum)
 PROGRESS NOTE   Corey Depaul Sr.  FMW:[id number]    DOB: 1956/10/21    DOA: 11/18/2023  PCP: Corey Charlie FERNS, Corey Armstrong   I have briefly reviewed patients previous medical records in [facility].    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 Thayer office (neurosurgery) for potential surgical fixation of pathological L5 fracture.  Undergoing preop workup, TTE and cardiology consultation with plans for possible surgery on 1/9 or 1/10 by Dr. Colon.   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  Recently he has been referred to radiation oncologist Corey Armstrong and interventional radiologist for needed ablation and vertebral body cement injection.  Seen by Dr. Colon, neurosurgery in office on 1/7 and sent to [facility] as direct admission for expedited workup and possible surgical intervention Discussed in detail with Dr. Colon, consideration for surgery on 1/9 or 1/10 Given prominent murmur on physical exam, dyspnea on exertion for last 3 to 4 months, getting TTE and cardiology consulted for preop clearance.  Severe aortic stenosis Reported since childhood.  He tells me that this may be hereditary  and runs in the family No recent echo in system.  Given murmur, DOE for last 3 to 4 months, requested TTE to evaluate and cardiology consulted.  Addendum: TTE 1/8: LVEF 60-65%, grade 2 diastolic dysfunction severe aortic valve stenosis and moderate aortic valve regurgitation. Cardiology input appreciated.  Recommend that he is high risk for surgery but advise proceeding with surgery without further cardiac workup or intervention, avoid perioperative hypotension and maintain SBP >130s.  Metastatic hepatocellular carcinoma Patient has been managed by oncology Corey Armstrong outpatient. CT lumbar spine from 12/70/24 showed known metastatic lesion of L5 with a displaced osseous fragment projecting dorsally from posterior inferior corner and extending 9 mm into the spinal canal. Severe right neural foraminal and moderate spinal canal stenosis at L5-S1.  Essential hypertension Blood pressures soft and antihypertensives were initially held BP have normalized, resume antihypertensives.   Nausea secondary to cancer Continue as needed antiemetics.    Chronic pain syndrome cancer Continue home regimen include hydrocodone  twice daily, oxycodone  as needed for severe pain.   Anemia of chronic disease Baseline hemoglobin of 11-12, presented with hemoglobin of 10.9 which is dropped further to 9.9 in the absence of overt bleed Suspect anemia of malignancy, chronic disease and hemodilution Follow CBC in AM.  Stage II chronic kidney disease Baseline creatinine between 1.2-1.3 Presented with creatinine of 1.45 which is improved to 1.39 Does not meet criteria for AKI. Encourage oral intake.  Follow BMP in AM.  Body mass index is 20.25 kg/m.    DVT prophylaxis: heparin  injection 5,000 Units Start: 11/18/23 2200 SCDs Start: 11/18/23 2037 Place TED hose Start: 11/18/23 2037     Code Status: Full Code:  Family Communication: Discussed with Corey Armstrong who is also his healthcare power of attorney and she  works at [employer] as TOC. Disposition:  Status is: Inpatient Remains inpatient appropriate because: Awaiting back surgery     Consultants:   Neurosurgery-Dr. Colon Cardiology for preop clearance  Procedures:     Antimicrobials:      Subjective:  Seen this morning along with his Corey-Armstrong/HCPOA at bedside.  Ongoing right-sided hip to foot pain, controlled on p.o. meds.  Also unable to lift his right foot off the ground while walking.  Objective:   Vitals:   11/18/23 1951 11/18/23 2332 11/19/23 0412 11/19/23 0746  BP: (!) 109/53 (!) 107/45 112/60 (!) 136/50  Pulse: 77 63 60 60  Resp: 18 18 18 18   Temp: 98.2 F (36.8 C) 98.2 F (36.8 C) 97.9 F (36.6 C) 98.5 F (36.9 C)  TempSrc: Oral Oral Oral Oral  SpO2: 99% 98% 97% 100%  Weight: 58.7 kg     Height: 5' 7 (1.702 m)       General exam: Middle-age male, moderately built, thinly nourished, lying comfortably propped up in bed without distress. Respiratory system: Clear to auscultation. Respiratory effort normal. Cardiovascular system: S1 & S2 heard, RRR. No JVD, rubs, gallops or clicks. No pedal edema.  3/6 blowing systolic murmur best heard at right lower sternal border,?  Diastolic murmur as well. Gastrointestinal system: Abdomen is nondistended, soft and nontender. No organomegaly or masses felt. Normal bowel sounds heard. Central nervous system: Alert and oriented. No focal neurological deficits. Extremities: Symmetric 5 x 5 power except RLE with grade 4+/5 power and right foot drop. Skin: No rashes, lesions or ulcers Psychiatry: Judgement and insight appear normal. Mood & affect appropriate.     Data Reviewed:   I have personally reviewed following labs and imaging studies   CBC: Recent Labs  Lab 11/18/23 2046 11/19/23 0542  WBC 5.5 4.2  NEUTROABS 3.2  --   HGB 10.9* 9.9*  HCT 33.1* 30.4*  MCV 99.7 99.0  PLT 197 184    Basic Metabolic Panel: Recent Labs  Lab 11/18/23 2046 11/19/23 0542  NA 142 141   K 4.6 3.9  CL 108 107  CO2 25 26  GLUCOSE 116* 93  BUN 29* 32*  CREATININE 1.45* 1.39*  CALCIUM  9.7 9.4    Liver Function Tests: Recent Labs  Lab 11/18/23 2046 11/19/23 0542  AST 17 15  ALT 13 13  ALKPHOS 59 49  BILITOT 0.4 0.4  PROT 7.2 6.4*  ALBUMIN 3.9 3.4*    CBG: No results for input(s): GLUCAP in the last 168 hours.  Microbiology Studies:  No results found for this or any previous visit (from the past 240 hours).  Radiology Studies:  MR Lumbar Spine W Wo Contrast Result Date: 11/19/2023 CLINICAL DATA:  Initial evaluation for compression fracture, known malignancy. EXAM: MRI LUMBAR SPINE WITHOUT AND WITH CONTRAST TECHNIQUE: Multiplanar and multiecho pulse sequences of the lumbar spine were obtained without and with intravenous contrast. CONTRAST:  6mL GADAVIST  GADOBUTROL  1 MMOL/ML IV SOLN COMPARISON:  Prior CT from 10/28/2023 and MRI from 09/24/2023. FINDINGS: Segmentation: Standard. Lowest well-formed disc space labeled the L5-S1 level. Alignment: Trace dextroscoliosis. Trace degenerative anterolisthesis of L2 on L3. Vertebrae: Previously identified metastatic lesion involving the posterior aspect of the L5 vertebral body again seen, not significantly changed in size or appearance since previous. Associated pathologic fracture with osseous fragment posterior to the L5 vertebral body, better appreciated on recent CT. Epidural extension with tumor  extending into the ventral epidural space, little interval changed. No new metastatic lesions within the lumbar spine. Vertebral body height otherwise maintained. Bone marrow signal intensity otherwise normal. No evidence for osteomyelitis discitis or septic arthritis. Conus medullaris and cauda equina: Conus extends to the L1 level. Conus and cauda equina appear normal. Paraspinal and other soft tissues: Paraspinous soft tissues demonstrate no acute finding. Multiple scattered T2 hyperintense cyst noted about the visualized kidneys,  benign in appearance, no follow-up imaging recommended. Disc levels: L1-2:  Unremarkable. L2-3: Trace anterolisthesis. Disc desiccation with mild disc bulge and reactive endplate spurring. Mild right greater than left facet hypertrophy with small joint effusions. No significant spinal stenosis. Mild bilateral L2 foraminal narrowing. L3-4: Disc desiccation with minimal disc bulge. Superimposed small left foraminal disc protrusion closely approximates the exiting left L3 nerve root (series 8, image 22). Mild facet spurring. No spinal stenosis. Mild bilateral L3 foraminal stenosis. L4-5: Disc desiccation with mild disc bulge. Mild facet and ligament flavum hypertrophy with trace joint effusions. No significant spinal stenosis. Mild bilateral L4 foraminal narrowing. L5-S1: Metastatic lesion with epidural extension of tumor into the ventral epidural space. Superimposed mild bilateral facet hypertrophy. Resultant moderate canal with moderate to severe bilateral subarticular stenosis. Mild left with moderate right L5 foraminal narrowing. Overall appearance is similar to previous MRI. IMPRESSION: 1. No significant interval change in size or appearance of known metastatic lesion involving the posterior aspect of the L5 vertebral body. Associated pathologic fracture fragment better appreciated on recent CT. Epidural extension of tumor into the ventral epidural space with resultant moderate canal with moderate to severe bilateral subarticular stenosis. 2. No new metastatic disease within the lumbar spine. 3. Mild spondylosis elsewhere within the lumbar spine as above. No other significant spinal stenosis. Mild bilateral L2 through L4 foraminal narrowing, with mild left and moderate right L5 foraminal stenosis. Electronically Signed   By: Morene Hoard M.D.   On: 11/19/2023 05:15    Scheduled Meds:    heparin   5,000 Units Subcutaneous Q8H   HYDROcodone  Bitartrate ER  1 capsule Oral Q12H   metoCLOPramide   10 mg  Oral QID   sodium chloride  flush  3 mL Intravenous Q12H    Continuous Infusions:    sodium chloride      dextrose  5 % and 0.45 % NaCl 75 mL/hr at 11/19/23 0045     LOS: 1 day     Trenda Mar, Corey Armstrong,  FACP, [facility], [facility], [facility]   [facility] Advisor [place]      To contact the attending provider between 7A-7P or the covering provider during after hours 7P-7A, please log into the web site [email or web address] and access using universal [place] password for that web site. If you do not have the password, please call the hospital operator.  11/19/2023, 12:09 PM

## 2023-11-19 NOTE — TOC Initial Note (Addendum)
 Transition of Care (TOC) - Initial/Assessment Note    Patient Details  Name: Corey Misch Sr. MRN: [id number] Date of Birth: 12/07/1955  Transition of Care [facility]) CM/SW Contact:    Andrez JULIANNA George, RN Phone Number: 11/19/2023, 12:07 PM  Clinical Narrative:                  Pt is from home alone. His daughter and ex spouse (CM for [facility]) check on him frequently.  Pts ex spouse, [name] plans on pt discharging to her home. She is his HCPOA. Pt manages his own medications and denies any issues.  Family will provide needed transportation. Pt to have surgery tomorrow. Will need therapy evals post surgery to determine d/c disposition. TOC following.  Expected Discharge Plan:  (TBD) Barriers to Discharge: Continued Medical Work up   Patient Goals and CMS Choice            Expected Discharge Plan and Services       Living arrangements for the past 2 months: Single Family Home                                      Prior Living Arrangements/Services Living arrangements for the past 2 months: Single Family Home Lives with:: Self Patient language and need for interpreter reviewed:: Yes Do you feel safe going back to the place where you live?: Yes        Care giver support system in place?: Yes (comment) Current home services: DME (walker/ wheelchair/ cane) Criminal Activity/Legal Involvement Pertinent to Current Situation/Hospitalization: No - Comment as needed  Activities of Daily Living   ADL Screening (condition at time of admission) Independently performs ADLs?: Yes (appropriate for developmental age) Is the patient deaf or have difficulty hearing?: No Does the patient have difficulty seeing, even when wearing glasses/contacts?: No Does the patient have difficulty concentrating, remembering, or making decisions?: No  Permission Sought/Granted                  Emotional Assessment Appearance:: Appears stated age Attitude/Demeanor/Rapport:  Engaged Affect (typically observed): Accepting Orientation: : Oriented to Self, Oriented to Place, Oriented to  Time, Oriented to Situation   Psych Involvement: No (comment)  Admission diagnosis:  Vertebral compression fracture (HCC) [M48.50XA] Patient Active Problem List   Diagnosis Date Noted   Chronic pain syndrome 11/18/2023   Chronic nausea 11/18/2023   Vertebral compression fracture (HCC) 11/18/2023   Acute kidney injury superimposed on chronic kidney disease (HCC) 11/18/2023   Anemia of chronic disease 11/18/2023   Preoperative evaluation to rule out surgical contraindication 11/07/2023   Low back pain radiating to both legs 06/27/2023   Metastatic cancer to bone (HCC) 06/12/2023   Essential hypertension 09/12/2022   Heart murmur 09/12/2022   Preventative health care 08/02/2022   Hepatocellular carcinoma in adult [facility] 03/01/2020   PCP:  [name], MD Pharmacy:   [phone] [address] [place] - [address] [place] [address] Phone: [phone] Fax: [phone]  [facility] [place] [place] [facility] [address] [place] [address] Phone: [phone] Fax: [phone]  Section - [facility] [address] [place] [address] Phone: [phone] Fax: [phone]     Social Drivers of Health (SDOH) Social History: SDOH Screenings   Food Insecurity: No Food Insecurity (11/18/2023)  Housing: Low Risk  (11/18/2023)  Recent Concern: Housing - High Risk (11/06/2023)  Transportation Needs: No Transportation Needs (11/18/2023)  Utilities: Not At Risk (11/18/2023)  Alcohol Screen: Low Risk  (06/13/2023)  Depression (PHQ2-9): Low Risk  (09/12/2022)  Financial Resource Strain: Medium Risk (11/06/2023)  Physical Activity: Sufficiently Active (11/06/2023)  Social Connections: Moderately Integrated (11/18/2023)  Stress: No Stress Concern Present (11/06/2023)  Tobacco Use:  Medium Risk (11/18/2023)   SDOH Interventions:     Readmission Risk Interventions     No data to display

## 2023-11-20 ENCOUNTER — Inpatient Hospital Stay (HOSPITAL_COMMUNITY): Payer: Commercial Managed Care - PPO

## 2023-11-20 ENCOUNTER — Other Ambulatory Visit: Payer: Self-pay

## 2023-11-20 ENCOUNTER — Inpatient Hospital Stay (HOSPITAL_COMMUNITY): Payer: Commercial Managed Care - PPO | Admitting: Anesthesiology

## 2023-11-20 ENCOUNTER — Encounter (HOSPITAL_COMMUNITY)
Admission: EM | Disposition: A | Discharge status: Home / Self Care | Payer: Self-pay | Source: Other Acute Inpatient Hospital | Attending: Internal Medicine

## 2023-11-20 ENCOUNTER — Encounter (HOSPITAL_COMMUNITY): Payer: Self-pay | Admitting: Internal Medicine

## 2023-11-20 ENCOUNTER — Encounter: Payer: Self-pay | Admitting: Sports Medicine

## 2023-11-20 DIAGNOSIS — I350 Nonrheumatic aortic (valve) stenosis: Secondary | ICD-10-CM

## 2023-11-20 DIAGNOSIS — C220 Liver cell carcinoma: Secondary | ICD-10-CM | POA: Diagnosis not present

## 2023-11-20 DIAGNOSIS — I10 Essential (primary) hypertension: Secondary | ICD-10-CM | POA: Diagnosis not present

## 2023-11-20 DIAGNOSIS — D638 Anemia in other chronic diseases classified elsewhere: Secondary | ICD-10-CM

## 2023-11-20 DIAGNOSIS — C7951 Secondary malignant neoplasm of bone: Secondary | ICD-10-CM | POA: Diagnosis not present

## 2023-11-20 DIAGNOSIS — M48061 Spinal stenosis, lumbar region without neurogenic claudication: Secondary | ICD-10-CM

## 2023-11-20 DIAGNOSIS — S32050D Wedge compression fracture of fifth lumbar vertebra, subsequent encounter for fracture with routine healing: Secondary | ICD-10-CM | POA: Diagnosis not present

## 2023-11-20 HISTORY — PX: DECOMPRESSIVE LUMBAR LAMINECTOMY LEVEL 1: SHX5791

## 2023-11-20 LAB — CBC
HCT: 32.8 % — ABNORMAL LOW (ref 39.0–52.0)
Hemoglobin: 10.9 g/dL — ABNORMAL LOW (ref 13.0–17.0)
MCH: 32.3 pg (ref 26.0–34.0)
MCHC: 33.2 g/dL (ref 30.0–36.0)
MCV: 97.3 fL (ref 80.0–100.0)
Platelets: 181 10*3/uL (ref 150–400)
RBC: 3.37 MIL/uL — ABNORMAL LOW (ref 4.22–5.81)
RDW: 12.6 % (ref 11.5–15.5)
WBC: 6.1 10*3/uL (ref 4.0–10.5)
nRBC: 0 % (ref 0.0–0.2)

## 2023-11-20 LAB — BASIC METABOLIC PANEL
Anion gap: 10 (ref 5–15)
BUN: 30 mg/dL — ABNORMAL HIGH (ref 8–23)
CO2: 24 mmol/L (ref 22–32)
Calcium: 9.9 mg/dL (ref 8.9–10.3)
Chloride: 105 mmol/L (ref 98–111)
Creatinine, Ser: 1.38 mg/dL — ABNORMAL HIGH (ref 0.61–1.24)
GFR, Estimated: 57 mL/min — ABNORMAL LOW (ref >60)
Glucose, Bld: 100 mg/dL — ABNORMAL HIGH (ref 70–99)
Potassium: 4 mmol/L (ref 3.5–5.1)
Sodium: 139 mmol/L (ref 135–145)

## 2023-11-20 LAB — SURGICAL PCR SCREEN
MRSA, PCR: NEGATIVE
Staphylococcus aureus: NEGATIVE

## 2023-11-20 LAB — TYPE AND SCREEN
ABO/RH(D): O POS
Antibody Screen: NEGATIVE

## 2023-11-20 SURGERY — DECOMPRESSIVE LUMBAR LAMINECTOMY LEVEL 1
Anesthesia: General | Site: Spine Lumbar

## 2023-11-20 MED ORDER — PHENYLEPHRINE HCL-NACL 20-0.9 MG/250ML-% IV SOLN
INTRAVENOUS | Status: DC | PRN
  Administered 2023-11-20: 40 ug/min via INTRAVENOUS

## 2023-11-20 MED ORDER — OXYCODONE HCL 5 MG PO TABS: 5.0000 mg | ORAL_TABLET | Freq: Once | ORAL | Status: DC | PRN

## 2023-11-20 MED ORDER — SODIUM CHLORIDE 0.9 % IV SOLN
250.0000 mL | INTRAVENOUS | Status: DC
  Administered 2023-11-20: 250 mL via INTRAVENOUS

## 2023-11-20 MED ORDER — ESMOLOL HCL 100 MG/10ML IV SOLN
INTRAVENOUS | Status: DC | PRN
  Administered 2023-11-20: 30 mg via INTRAVENOUS

## 2023-11-20 MED ORDER — THROMBIN 5000 UNITS EX SOLR
CUTANEOUS | Status: AC
  Filled 2023-11-20: qty 5000

## 2023-11-20 MED ORDER — ORAL CARE MOUTH RINSE: 15.0000 mL | Freq: Once | OROMUCOSAL | Status: AC

## 2023-11-20 MED ORDER — ONDANSETRON HCL 4 MG/2ML IJ SOLN
INTRAMUSCULAR | Status: DC | PRN
  Administered 2023-11-20: 4 mg via INTRAVENOUS

## 2023-11-20 MED ORDER — METHOCARBAMOL 500 MG PO TABS
500.0000 mg | ORAL_TABLET | Freq: Four times a day (QID) | ORAL | Status: DC | PRN
  Administered 2023-11-20 – 2023-11-21 (×2): 500 mg via ORAL
  Filled 2023-11-20 (×2): qty 1

## 2023-11-20 MED ORDER — FENTANYL CITRATE (PF) 250 MCG/5ML IJ SOLN
INTRAMUSCULAR | Status: DC | PRN
  Administered 2023-11-20 (×3): 50 ug via INTRAVENOUS
  Administered 2023-11-20: 100 ug via INTRAVENOUS

## 2023-11-20 MED ORDER — THROMBIN 5000 UNITS EX SOLR
OROMUCOSAL | Status: DC | PRN
  Administered 2023-11-20: 5 mL via TOPICAL

## 2023-11-20 MED ORDER — BISACODYL 10 MG RE SUPP: 10.0000 mg | Freq: Every day | RECTAL | Status: DC | PRN

## 2023-11-20 MED ORDER — HYDROMORPHONE HCL 1 MG/ML IJ SOLN: 0.5000 mg | INTRAMUSCULAR | Status: DC | PRN

## 2023-11-20 MED ORDER — DOCUSATE SODIUM 100 MG PO CAPS
100.0000 mg | ORAL_CAPSULE | Freq: Two times a day (BID) | ORAL | Status: DC
  Administered 2023-11-20 – 2023-11-21 (×2): 100 mg via ORAL
  Filled 2023-11-20 (×2): qty 1

## 2023-11-20 MED ORDER — LIDOCAINE 2% (20 MG/ML) 5 ML SYRINGE
INTRAMUSCULAR | Status: DC | PRN
  Administered 2023-11-20: 40 mg via INTRAVENOUS

## 2023-11-20 MED ORDER — CHLORHEXIDINE GLUCONATE 0.12 % MT SOLN
OROMUCOSAL | Status: AC
  Administered 2023-11-20: 15 mL via OROMUCOSAL
  Filled 2023-11-20: qty 15

## 2023-11-20 MED ORDER — METHOCARBAMOL 1000 MG/10ML IJ SOLN: 500.0000 mg | Freq: Four times a day (QID) | INTRAMUSCULAR | Status: DC | PRN

## 2023-11-20 MED ORDER — BUPIVACAINE HCL (PF) 0.5 % IJ SOLN
INTRAMUSCULAR | Status: DC | PRN
  Administered 2023-11-20: 5 mL

## 2023-11-20 MED ORDER — PHENOL 1.4 % MT LIQD: 1.0000 | OROMUCOSAL | Status: DC | PRN

## 2023-11-20 MED ORDER — MIDAZOLAM HCL 2 MG/2ML IJ SOLN
INTRAMUSCULAR | Status: AC
  Filled 2023-11-20: qty 2

## 2023-11-20 MED ORDER — LIDOCAINE-EPINEPHRINE 1 %-1:100000 IJ SOLN
INTRAMUSCULAR | Status: DC | PRN
  Administered 2023-11-20: 5 mL

## 2023-11-20 MED ORDER — ACETAMINOPHEN 325 MG PO TABS: 650.0000 mg | ORAL_TABLET | ORAL | Status: DC | PRN

## 2023-11-20 MED ORDER — HYDROMORPHONE HCL 1 MG/ML IJ SOLN
INTRAMUSCULAR | Status: AC
  Filled 2023-11-20: qty 0.5

## 2023-11-20 MED ORDER — PROPOFOL 10 MG/ML IV BOLUS
INTRAVENOUS | Status: AC
  Filled 2023-11-20: qty 20

## 2023-11-20 MED ORDER — 0.9 % SODIUM CHLORIDE (POUR BTL) OPTIME
TOPICAL | Status: DC | PRN
  Administered 2023-11-20: 1000 mL

## 2023-11-20 MED ORDER — ACETAMINOPHEN 10 MG/ML IV SOLN: 1000.0000 mg | Freq: Once | INTRAVENOUS | Status: DC | PRN

## 2023-11-20 MED ORDER — ACETAMINOPHEN 650 MG RE SUPP: 650.0000 mg | RECTAL | Status: DC | PRN

## 2023-11-20 MED ORDER — FLEET ENEMA RE ENEM: 1.0000 | ENEMA | Freq: Once | RECTAL | Status: DC | PRN

## 2023-11-20 MED ORDER — SENNA 8.6 MG PO TABS
1.0000 | ORAL_TABLET | Freq: Two times a day (BID) | ORAL | Status: DC
  Administered 2023-11-20 – 2023-11-21 (×2): 8.6 mg via ORAL
  Filled 2023-11-20 (×2): qty 1

## 2023-11-20 MED ORDER — FENTANYL CITRATE (PF) 100 MCG/2ML IJ SOLN: 25.0000 ug | INTRAMUSCULAR | Status: DC | PRN

## 2023-11-20 MED ORDER — SUGAMMADEX SODIUM 200 MG/2ML IV SOLN
INTRAVENOUS | Status: DC | PRN
  Administered 2023-11-20: 200 mg via INTRAVENOUS

## 2023-11-20 MED ORDER — CEFAZOLIN SODIUM-DEXTROSE 1-4 GM/50ML-% IV SOLN
INTRAVENOUS | Status: DC | PRN
  Administered 2023-11-20: 2 g via INTRAVENOUS

## 2023-11-20 MED ORDER — DEXAMETHASONE SODIUM PHOSPHATE 10 MG/ML IJ SOLN
INTRAMUSCULAR | Status: DC | PRN
  Administered 2023-11-20: 10 mg via INTRAVENOUS

## 2023-11-20 MED ORDER — CHLORHEXIDINE GLUCONATE 0.12 % MT SOLN: 15.0000 mL | Freq: Once | OROMUCOSAL | Status: AC

## 2023-11-20 MED ORDER — ALUM & MAG HYDROXIDE-SIMETH 200-200-20 MG/5ML PO SUSP
30.0000 mL | Freq: Four times a day (QID) | ORAL | Status: DC | PRN
Start: 2023-11-20 — End: 2023-11-21

## 2023-11-20 MED ORDER — OXYCODONE HCL 5 MG/5ML PO SOLN: 5.0000 mg | Freq: Once | ORAL | Status: DC | PRN

## 2023-11-20 MED ORDER — SODIUM CHLORIDE 0.9% FLUSH
3.0000 mL | Freq: Two times a day (BID) | INTRAVENOUS | Status: DC
  Administered 2023-11-21: 3 mL via INTRAVENOUS

## 2023-11-20 MED ORDER — DROPERIDOL 2.5 MG/ML IJ SOLN: 0.6250 mg | Freq: Once | INTRAMUSCULAR | Status: DC | PRN

## 2023-11-20 MED ORDER — BUPIVACAINE HCL (PF) 0.5 % IJ SOLN
INTRAMUSCULAR | Status: AC
  Filled 2023-11-20: qty 30

## 2023-11-20 MED ORDER — POLYETHYLENE GLYCOL 3350 17 G PO PACK: 17.0000 g | PACK | Freq: Every day | ORAL | Status: DC | PRN

## 2023-11-20 MED ORDER — LIDOCAINE-EPINEPHRINE 1 %-1:100000 IJ SOLN
INTRAMUSCULAR | Status: AC
  Filled 2023-11-20: qty 1

## 2023-11-20 MED ORDER — LACTATED RINGERS IV SOLN: INTRAVENOUS | Status: DC

## 2023-11-20 MED ORDER — HYDROMORPHONE HCL 1 MG/ML IJ SOLN
INTRAMUSCULAR | Status: DC | PRN
  Administered 2023-11-20: .25 mg via INTRAVENOUS

## 2023-11-20 MED ORDER — MENTHOL 3 MG MT LOZG: 1.0000 | LOZENGE | OROMUCOSAL | Status: DC | PRN

## 2023-11-20 MED ORDER — CEFAZOLIN SODIUM-DEXTROSE 2-4 GM/100ML-% IV SOLN
2.0000 g | Freq: Three times a day (TID) | INTRAVENOUS | Status: AC
  Administered 2023-11-20 – 2023-11-21 (×2): 2 g via INTRAVENOUS
  Filled 2023-11-20 (×2): qty 100

## 2023-11-20 MED ORDER — ROCURONIUM BROMIDE 10 MG/ML (PF) SYRINGE
PREFILLED_SYRINGE | INTRAVENOUS | Status: DC | PRN
  Administered 2023-11-20: 60 mg via INTRAVENOUS

## 2023-11-20 MED ORDER — SODIUM CHLORIDE 0.9% FLUSH: 3.0000 mL | INTRAVENOUS | Status: DC | PRN

## 2023-11-20 MED ORDER — PROPOFOL 10 MG/ML IV BOLUS
INTRAVENOUS | Status: DC | PRN
  Administered 2023-11-20 (×2): 100 mg via INTRAVENOUS

## 2023-11-20 MED ORDER — KETOROLAC TROMETHAMINE 15 MG/ML IJ SOLN
7.5000 mg | Freq: Four times a day (QID) | INTRAMUSCULAR | Status: AC
  Administered 2023-11-20 – 2023-11-21 (×4): 7.5 mg via INTRAVENOUS
  Filled 2023-11-20 (×4): qty 1

## 2023-11-20 MED ORDER — FENTANYL CITRATE (PF) 250 MCG/5ML IJ SOLN
INTRAMUSCULAR | Status: AC
  Filled 2023-11-20: qty 5

## 2023-11-20 SURGICAL SUPPLY — 44 items
BAG COUNTER SPONGE SURGICOUNT (BAG) ×1 IMPLANT
BLADE CLIPPER SURG (BLADE) IMPLANT
BUR ACORN 6.0 (BURR) IMPLANT
BUR MATCHSTICK NEURO 3.0 LAGG (BURR) ×1 IMPLANT
CANISTER SUCT 3000ML PPV (MISCELLANEOUS) ×1 IMPLANT
CATH FOLEY LF 16FR (CATHETERS) IMPLANT
DERMABOND ADVANCED .7 DNX12 (GAUZE/BANDAGES/DRESSINGS) ×1 IMPLANT
DEVICE DISSECT PLASMABLAD 3.0S (MISCELLANEOUS) ×1 IMPLANT
DRAPE HALF SHEET 40X57 (DRAPES) IMPLANT
DRAPE LAPAROTOMY T 102X78X121 (DRAPES) ×1 IMPLANT
DRAPE MICROSCOPE SLANT 54X150 (MISCELLANEOUS) IMPLANT
DRSG OPSITE POSTOP 4X6 (GAUZE/BANDAGES/DRESSINGS) IMPLANT
DURAPREP 26ML APPLICATOR (WOUND CARE) ×1 IMPLANT
ELECT REM PT RETURN 9FT ADLT (ELECTROSURGICAL) ×1 IMPLANT
ELECTRODE REM PT RTRN 9FT ADLT (ELECTROSURGICAL) ×1 IMPLANT
GAUZE 4X4 16PLY ~~LOC~~+RFID DBL (SPONGE) IMPLANT
GAUZE SPONGE 4X4 12PLY STRL (GAUZE/BANDAGES/DRESSINGS) ×1 IMPLANT
GLOVE BIOGEL PI IND STRL 8.5 (GLOVE) ×1 IMPLANT
GLOVE ECLIPSE 8.5 STRL (GLOVE) ×1 IMPLANT
GOWN STRL REUS W/ TWL LRG LVL3 (GOWN DISPOSABLE) IMPLANT
GOWN STRL REUS W/ TWL XL LVL3 (GOWN DISPOSABLE) IMPLANT
GOWN STRL REUS W/TWL 2XL LVL3 (GOWN DISPOSABLE) ×1 IMPLANT
HEMOSTAT POWDER KIT SURGIFOAM (HEMOSTASIS) ×1 IMPLANT
KIT BASIN OR (CUSTOM PROCEDURE TRAY) ×1 IMPLANT
KIT TURNOVER KIT B (KITS) ×1 IMPLANT
NDL HYPO 22X1.5 SAFETY MO (MISCELLANEOUS) ×1 IMPLANT
NDL SPNL 20GX3.5 QUINCKE YW (NEEDLE) IMPLANT
NEEDLE HYPO 22X1.5 SAFETY MO (MISCELLANEOUS) ×1 IMPLANT
NEEDLE SPNL 20GX3.5 QUINCKE YW (NEEDLE) IMPLANT
NS IRRIG 1000ML POUR BTL (IV SOLUTION) ×1 IMPLANT
PACK LAMINECTOMY NEURO (CUSTOM PROCEDURE TRAY) ×1 IMPLANT
PAD ARMBOARD 7.5X6 YLW CONV (MISCELLANEOUS) ×3 IMPLANT
PATTIES SURGICAL .5 X1 (DISPOSABLE) ×1 IMPLANT
PLASMABLADE 3.0S (MISCELLANEOUS) ×1 IMPLANT
SPIKE FLUID TRANSFER (MISCELLANEOUS) ×1 IMPLANT
SPONGE SURGIFOAM ABS GEL SZ50 (HEMOSTASIS) IMPLANT
SUT VIC AB 1 CT1 18XBRD ANBCTR (SUTURE) ×1 IMPLANT
SUT VIC AB 2-0 CP2 18 (SUTURE) ×1 IMPLANT
SUT VIC AB 3-0 SH 8-18 (SUTURE) ×1 IMPLANT
SUT VIC AB 4-0 RB1 18 (SUTURE) ×1 IMPLANT
TOWEL GREEN STERILE (TOWEL DISPOSABLE) ×1 IMPLANT
TOWEL GREEN STERILE FF (TOWEL DISPOSABLE) ×1 IMPLANT
TUBING FEATHERFLOW (TUBING) ×1 IMPLANT
WATER STERILE IRR 1000ML POUR (IV SOLUTION) ×1 IMPLANT

## 2023-11-20 NOTE — Anesthesia Preprocedure Evaluation (Addendum)
 Anesthesia Evaluation  Patient identified by MRN, date of birth, ID band Patient awake    Reviewed: Allergy & Precautions, NPO status , Patient's Chart, lab work & pertinent test results  History of Anesthesia Complications Negative for: history of anesthetic complications  Airway Mallampati: I  TM Distance: >3 FB Neck ROM: Full    Dental  (+) Edentulous Lower, Edentulous Upper   Pulmonary former smoker   Pulmonary exam normal        Cardiovascular hypertension, Pt. on medications + Valvular Problems/Murmurs AS  Rhythm:Regular Rate:Normal + Systolic murmurs 1. Left ventricular ejection fraction, by estimation, is 60 to 65%. The  left ventricle has normal function. The left ventricle has no regional  wall motion abnormalities. There is moderate concentric left ventricular  hypertrophy. Left ventricular  diastolic parameters are consistent with Grade II diastolic dysfunction  (pseudonormalization).   2. Right ventricular systolic function is normal. The right ventricular  size is normal. There is normal pulmonary artery systolic pressure.   3. Left atrial size was mildly dilated.   4. Mild mitral valve regurgitation.   5. Unable to determine if this is a bicupsid valve. AI directed towards  the anterior leaflet. The aortic valve is calcified. Aortic valve  regurgitation is moderate. Severe aortic valve stenosis. Aortic valve  area, by VTI measures 0.85 cm. Aortic valve  mean gradient measures 41.0 mmHg.   6. The inferior vena cava is normal in size with greater than 50%  respiratory variability, suggesting right atrial pressure of 3 mmHg.     Neuro/Psych  Headaches  negative psych ROS   GI/Hepatic negative GI ROS,,,(+)     substance abuse  , Hepatitis -Hepatocellular carcinoma with bone mets   Endo/Other  negative endocrine ROS    Renal/GU Renal disease     Musculoskeletal  (+)  narcotic dependentUses cane and  wheelchair   Abdominal   Peds  Hematology  (+) Blood dyscrasia, anemia   Anesthesia Other Findings Pathological fracture of vertebra   Reproductive/Obstetrics                             Anesthesia Physical Anesthesia Plan  ASA: 4  Anesthesia Plan: General   Post-op Pain Management:    Induction: Intravenous  PONV Risk Score and Plan: 2 and Treatment may vary due to age or medical condition, Ondansetron , Dexamethasone  and Midazolam   Airway Management Planned: Oral ETT  Additional Equipment: Arterial line  Intra-op Plan:   Post-operative Plan: Extubation in OR  Informed Consent: I have reviewed the patients History and Physical, chart, labs and discussed the procedure including the risks, benefits and alternatives for the proposed anesthesia with the patient or authorized representative who has indicated his/her understanding and acceptance.     Dental advisory given  Plan Discussed with: CRNA  Anesthesia Plan Comments:         Anesthesia Quick Evaluation

## 2023-11-20 NOTE — Op Note (Signed)
 Date of surgery: 11/20/2023 Preoperative diagnosis: Metastatic cancer to L5 with high-grade stenosis Postoperative diagnosis: Same Procedure: Bilateral laminotomies L5-S1 with decompression of the spinal canal and the L5 nerve roots epidural tumor. Surgeon: Victory Gens First Assistant: Reyes Budge, MD Anesthesia: General endotracheal Indications: Corey Armstrong is a 68 year old individual whose had a hepatocellular cancer diagnosed a while back.  He has undergone treatment but has developed a metastasis to the L5 vertebrae with tumor in the epidural space and tumor and vertebral body that has eroded the inferior margin of the vertebral body.  The bulk of the vertebral body however remains intact there is some tumor infiltration into it.  The purpose of the surgery is to decompress the common dural tube the L5 nerve roots where the patient has had mild bilateral foot drops.  This is being done so the patient can undergo aggressive adjuvant therapy with radiation and chemotherapy.  Procedure: The patient was brought to the operating room supine on the stretcher.  After the smooth induction of general endotracheal anesthesia, he was carefully turned prone.  The back was prepped with alcohol DuraPrep and draped in a sterile fashion.  Midline incision was created and carried down to the lumbodorsal fascia.  Fascia was opened on the right side of the midline and a subperiosteal dissection was performed at L5-S1 which was identified positively with a radiograph placing a needle on the laminar arch of L5.  The outer layer of the yellow ligament was removed and the common dural tube was then carefully explored there is noted to be both dorsally is severely secondary to a mass underneath this there is a dilated epidural veins in this area which were easily cauterized and divided and underneath the epidural veins tumor was easily entered the tumor had a gelatinous and friable characteristic to it and could easily  be removed with suction and biopsy forceps.  The procedure continued until the epidural tumor there was removed the path of the L5 nerve root was identified and tumor around it was removed in a piecemeal fashion.  Bipolar cautery was used to control venous bleeding from around this tumor mass.  The hemostasis was obtainable with minimal consternation.  Then the opposite side underwent a laminotomy this was done with the help of Dr. Budge who worked on the contralateral side while I worked on the ipsilateral side eventually he performed a laminotomy and foraminotomies in the similar fashion and removed similar amounts tumor.  There was noted to be some retropulsed bone from the inferior margin of L5 that was removed in a piecemeal fashion from either side ultimately when the decompression was completed we could pass a nerve hook from left to right and right to left underneath the dural tube.  A good decompression was identified both cephalad to the superior edge of the vertebral body of L5 and inferiorly to the L5-S1 junction.  It was decided not to pursue tumor into the vertebral body as this would likely lead to potential collapse and instability.  The facet joints remained intact.  Once the decompression was performed and completed with hemostasis being achieved on both sides we remove the retractor and then proceeded with closure 10 cc of half percent Marcaine  was injected into the paraspinous fascia and then the fascia was closed with #1 Vicryl in interrupted fashion 2-0 Vicryl was used in the subcutaneous tissues 3-0 Vicryl subcuticularly.  Dermabond was placed on the skin.  Blood loss for the procedure was estimated 150 cc.

## 2023-11-20 NOTE — Progress Notes (Signed)
 PROGRESS NOTE   Corey Gahan Sr.  FMW:[id number]    DOB: December 29, 1955    DOA: 11/18/2023  PCP: [name], MD   I have briefly reviewed patients previous medical records in [facility].    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 Thayer office (neurosurgery) for potential surgical fixation of pathological L5 fracture.  Underwent preop workup, TTE revealed severe aortic stenosis, cardiology consulted and recommended proceeding with needed surgery with close perioperative monitoring with the understanding that he is high risk.  For surgery 1/9.   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 Lanny, oncology outpatient for the management of hepatocellular carcinoma.  Recently he has been referred to radiation oncologist Dr. Patrcia and interventional radiologist for needed ablation and vertebral body cement injection.  Seen by Dr. Colon, neurosurgery in office on 1/7 and sent to [facility] as direct admission for expedited workup and possible surgical intervention Given prominent murmur on physical exam, dyspnea on exertion for last 3 to 4 months, getting TTE and cardiology consulted for preop clearance. TTE 1/8 appreciated.  Cardiology  input noted.  As per cardiology, patient has severe aortic stenosis, this increases perioperative risk but he is not a candidate for either TAVR or SAVR at this time and any procedures likely would significantly delay his ability to have his pathological fractures addressed and without surgery his quality of life is significantly affected.  Thereby they recommended proceeding with surgery with the understanding that his surgical risk is high, recommended avoiding hypotension during surgery and in the perioperative period and maintain SBP >130. Seen this morning in patient's room along with Dr. Colon and HCPOA at bedside.  Plan for decompression at L5 vertebral level with possible need for arthrodesis from L4 to the sacrum to be determined at time of surgery.  Severe aortic stenosis Murmur reported since childhood.  He tells me that this may be hereditary and runs in the family No recent echo in system.  Given murmur, DOE for last 3 to 4 months, requested TTE to evaluate and cardiology consulted. TTE 1/8: LVEF 60-65%, grade 2 diastolic dysfunction severe aortic valve stenosis and moderate aortic valve regurgitation. Cardiology input as noted above.  Metastatic hepatocellular carcinoma Patient has been managed by oncology Dr. Lanny outpatient. CT lumbar spine from 10/28/23 showed known metastatic lesion of L5 with a displaced osseous fragment projecting dorsally from posterior inferior corner and extending 9 mm into the spinal canal. Severe right neural foraminal and moderate spinal canal stenosis at L5-S1.  Essential hypertension Blood pressures soft and antihypertensives were initially held SBP's in the 150s-160s range.  Given confirmed diagnosis of severe aortic stenosis, holding off on resuming antihypertensives to avoid perioperative hypotension.   Nausea secondary to cancer Continue as needed antiemetics.    Chronic pain syndrome cancer  Continue home regimen include hydrocodone  twice daily,  oxycodone  as needed for severe pain. [facility] pharmacy does not have his home long-acting hydrocodone , family unable to bring this in and this is held.  Moreover patient has only required up to 2 doses of as needed pain meds.   Anemia of chronic disease Baseline hemoglobin of 11-12, presented with hemoglobin of 10.9, stable Suspect anemia of malignancy, chronic disease and hemodilution  Stage II chronic kidney disease Baseline creatinine between 1.2-1.3 Presented with creatinine of 1.45 which is improved to 1.39-1.38 Does not meet criteria for AKI. Encourage oral intake.  Follow BMP in AM.  Body mass index is 22.13 kg/m.    DVT prophylaxis: SCDs Start: 11/18/23 2037 Place TED hose Start: 11/18/23 2037     Code Status: Full Code:  Family Communication: Discussed with ex spouse at bedside who is also his healthcare power of attorney and she works at [employer] as TOC.  Patient seen along with Dr. Colon preprocedure this morning Disposition:  Status is: Inpatient Remains inpatient appropriate because: Awaiting back surgery     Consultants:   Neurosurgery-Dr. Colon Cardiology for preop clearance  Procedures:     Antimicrobials:      Subjective:  Going right hip to foot pain, however has only used 2 doses of oral opioids yesterday.  Not getting long-acting opioid that he was on at home because that preparation is not available in the pharmacy and family unable to bring that.  Objective:   Vitals:   11/20/23 0825 11/20/23 1154 11/20/23 1246 11/20/23 1254  BP: (!) 161/44 (!) 159/56 (!) 157/58   Pulse: 60 60 61   Resp: 18 18 18    Temp: 97.9 F (36.6 C) 98.2 F (36.8 C) 98.6 F (37 C)   TempSrc: Oral Oral Oral   SpO2: 100% 100% 98%   Weight:      Height:    5' 7 (1.702 m)    General exam: Middle-age male, moderately built, thinly nourished, lying comfortably propped up in bed without distress. Respiratory system: Clear to auscultation. Respiratory effort  normal. Cardiovascular system: S1 & S2 heard, RRR. No JVD, rubs, gallops or clicks. No pedal edema.  3/6 blowing systolic murmur best heard at right lower sternal border,?  Diastolic murmur as well.  Telemetry personally reviewed: SB in the 50s-SR. Gastrointestinal system: Abdomen is nondistended, soft and nontender. No organomegaly or masses felt. Normal bowel sounds heard. Central nervous system: Alert and oriented. No focal neurological deficits. Extremities: Symmetric 5 x 5 power except RLE with grade 4+/5 power and right foot drop. Skin: No rashes, lesions or ulcers Psychiatry: Judgement and insight appear normal. Mood & affect appropriate.     Data Reviewed:   I have personally reviewed following labs and imaging studies   CBC: Recent Labs  Lab 11/18/23 2046 11/19/23 0542 11/20/23 0714  WBC 5.5 4.2 6.1  NEUTROABS 3.2  --   --   HGB 10.9* 9.9* 10.9*  HCT 33.1* 30.4* 32.8*  MCV 99.7 99.0 97.3  PLT 197 184 181    Basic Metabolic Panel: Recent Labs  Lab 11/18/23 2046 11/19/23 0542 11/20/23 0714  NA 142 141 139  K 4.6 3.9 4.0  CL 108 107 105  CO2 25 26 24   GLUCOSE 116* 93 100*  BUN 29* 32* 30*  CREATININE 1.45* 1.39* 1.38*  CALCIUM  9.7 9.4 9.9    Liver Function Tests: Recent Labs  Lab 11/18/23 2046 11/19/23 0542  AST 17 15  ALT 13 13  ALKPHOS 59 49  BILITOT 0.4 0.4  PROT 7.2 6.4*  ALBUMIN 3.9 3.4*    CBG: No results for input(s): GLUCAP in the last 168 hours.  Microbiology Studies:   Recent Results (from the past 240 hours)  Surgical PCR screen     Status: None   Collection Time: 11/19/23  8:51 PM   Specimen: Nasal Mucosa; Nasal Swab  Result Value Ref Range Status   MRSA, PCR NEGATIVE NEGATIVE Final   Staphylococcus aureus NEGATIVE NEGATIVE Final    Comment: (NOTE) The Xpert SA Assay (FDA approved for NASAL specimens in patients 52 years of age and older), is one component of a comprehensive surveillance program. It is not intended to  diagnose infection nor to guide or monitor treatment. Performed at [facility], [address], [place], [place] [address]     Radiology Studies:  ECHOCARDIOGRAM COMPLETE Result Date: 11/19/2023    ECHOCARDIOGRAM REPORT   Patient Name:   Alvin Diffee Sr. Date of Exam: 11/19/2023 Medical Rec #:  [id number]                  Height:       67.0 in Accession #:    [id number]                 Weight:       129.3 lb Date of Birth:  1956-10-04                   BSA:          1.680 m Patient Age:    67 years                   BP:           136/50 mmHg Patient Gender: M                          HR:           53 bpm. Exam Location:  Inpatient Procedure: 2D Echo, Color Doppler and Cardiac Doppler Indications:    murmur  History:        Patient has no prior history of Echocardiogram examinations.                 Risk Factors:Hypertension.  Sonographer:    Melissa Morford RDCS (AE, PE) Referring Phys: [id number] Natascha Edmonds D Deina Lipsey IMPRESSIONS  1. Left ventricular ejection fraction, by estimation, is 60 to 65%. The left ventricle has normal function. The left ventricle has no regional wall motion abnormalities. There is moderate concentric left ventricular hypertrophy. Left ventricular diastolic parameters are consistent with Grade II diastolic dysfunction (pseudonormalization).  2. Right ventricular systolic function is normal. The right ventricular size is normal. There is normal pulmonary artery systolic pressure.  3. Left atrial size was mildly dilated.  4. Mild mitral valve regurgitation.  5. Unable to determine if this is a bicupsid valve. AI directed towards the anterior leaflet. The aortic valve is calcified. Aortic valve regurgitation is moderate. Severe aortic valve stenosis. Aortic valve area, by VTI measures 0.85 cm. Aortic valve mean gradient measures 41.0 mmHg.  6. The inferior vena cava is normal in size with greater than 50% respiratory variability, suggesting right atrial pressure of 3 mmHg.  Conclusion(s)/Recommendation(s): Recommend cardiac CT for severe AS. FINDINGS  Left Ventricle: Left ventricular ejection fraction, by estimation, is 60 to 65%. The left ventricle has normal function. The left ventricle  has no regional wall motion abnormalities. The left ventricular internal cavity size was normal in size. There is  moderate concentric left ventricular hypertrophy. Left ventricular diastolic parameters are consistent with Grade II diastolic dysfunction (pseudonormalization). Right Ventricle: The right ventricular size is normal. Right ventricular systolic function is normal. There is normal pulmonary artery systolic pressure. The tricuspid regurgitant velocity is 2.32 m/s, and with an assumed right atrial pressure of 3 mmHg,  the estimated right ventricular systolic pressure is 24.5 mmHg. Left Atrium: Left atrial size was mildly dilated. Right Atrium: Right atrial size was normal in size. Pericardium: There is no evidence of pericardial effusion. Mitral Valve: Mild mitral annular calcification. Mild mitral valve regurgitation. Tricuspid Valve: Tricuspid valve regurgitation is mild. Aortic Valve: Unable to determine if this is a bicupsid valve. AI directed towards the anterior leaflet. The aortic valve is calcified. Aortic valve regurgitation is moderate. Severe aortic stenosis is present. Aortic valve mean gradient measures 41.0 mmHg. Aortic valve peak gradient measures 17.3 mmHg. Aortic valve area, by VTI measures 0.85 cm. Pulmonic Valve: Pulmonic valve regurgitation is trivial. Aorta: The aortic root and ascending aorta are structurally normal, with no evidence of dilitation. Venous: The inferior vena cava is normal in size with greater than 50% respiratory variability, suggesting right atrial pressure of 3 mmHg. IAS/Shunts: No atrial level shunt detected by color flow Doppler.  LEFT VENTRICLE PLAX 2D LVIDd:         4.90 cm      Diastology LVIDs:         3.10 cm      LV e' medial:    6.31 cm/s LV  PW:         1.20 cm      LV E/e' medial:  15.4 LV IVS:        1.20 cm      LV e' lateral:   7.07 cm/s LVOT diam:     2.00 cm      LV E/e' lateral: 13.7 LV SV:         89 LV SV Index:   53 LVOT Area:     3.14 cm  LV Volumes (MOD) LV vol d, MOD A2C: 112.0 ml LV vol d, MOD A4C: 123.0 ml LV vol s, MOD A2C: 51.3 ml LV vol s, MOD A4C: 58.6 ml LV SV MOD A2C:     60.7 ml LV SV MOD A4C:     123.0 ml LV SV MOD BP:      62.5 ml RIGHT VENTRICLE RV S prime:     12.40 cm/s TAPSE (M-mode): 2.6 cm LEFT ATRIUM           Index        RIGHT ATRIUM           Index LA diam:      3.60 cm 2.14 cm/m   RA Area:     15.00 cm LA Vol (A4C): 59.3 ml 35.30 ml/m  RA Volume:   41.90 ml  24.94 ml/m  AORTIC VALVE AV Area (Vmax):    1.92 cm AV Area (Vmean):   0.86 cm AV Area (VTI):     0.85 cm AV Vmax:           207.94 cm/s AV Vmean:          300.000 cm/s AV VTI:            1.050 m AV Peak Grad:      17.3 mmHg AV Mean Grad:  41.0 mmHg LVOT Vmax:         127.00 cm/s LVOT Vmean:        81.900 cm/s LVOT VTI:          0.284 m LVOT/AV VTI ratio: 0.27  AORTA Ao Root diam: 3.60 cm Ao Asc diam:  3.40 cm MITRAL VALVE               TRICUSPID VALVE MV Area (PHT): 3.06 cm    TR Peak grad:   21.5 mmHg MV Decel Time: 248 msec    TR Vmax:        232.00 cm/s MV E velocity: 96.90 cm/s MV A velocity: 82.60 cm/s  SHUNTS MV E/A ratio:  1.17        Systemic VTI:  0.28 m                            Systemic Diam: 2.00 cm Ronal Ross Electronically signed by Ronal Ross Signature Date/Time: 11/19/2023/1:17:02 PM    Final    MR Lumbar Spine W Wo Contrast Result Date: 11/19/2023 CLINICAL DATA:  Initial evaluation for compression fracture, known malignancy. EXAM: MRI LUMBAR SPINE WITHOUT AND WITH CONTRAST TECHNIQUE: Multiplanar and multiecho pulse sequences of the lumbar spine were obtained without and with intravenous contrast. CONTRAST:  6mL GADAVIST  GADOBUTROL  1 MMOL/ML IV SOLN COMPARISON:  Prior CT from 10/28/2023 and MRI from 09/24/2023. FINDINGS:  Segmentation: Standard. Lowest well-formed disc space labeled the L5-S1 level. Alignment: Trace dextroscoliosis. Trace degenerative anterolisthesis of L2 on L3. Vertebrae: Previously identified metastatic lesion involving the posterior aspect of the L5 vertebral body again seen, not significantly changed in size or appearance since previous. Associated pathologic fracture with osseous fragment posterior to the L5 vertebral body, better appreciated on recent CT. Epidural extension with tumor extending into the ventral epidural space, little interval changed. No new metastatic lesions within the lumbar spine. Vertebral body height otherwise maintained. Bone marrow signal intensity otherwise normal. No evidence for osteomyelitis discitis or septic arthritis. Conus medullaris and cauda equina: Conus extends to the L1 level. Conus and cauda equina appear normal. Paraspinal and other soft tissues: Paraspinous soft tissues demonstrate no acute finding. Multiple scattered T2 hyperintense cyst noted about the visualized kidneys, benign in appearance, no follow-up imaging recommended. Disc levels: L1-2:  Unremarkable. L2-3: Trace anterolisthesis. Disc desiccation with mild disc bulge and reactive endplate spurring. Mild right greater than left facet hypertrophy with small joint effusions. No significant spinal stenosis. Mild bilateral L2 foraminal narrowing. L3-4: Disc desiccation with minimal disc bulge. Superimposed small left foraminal disc protrusion closely approximates the exiting left L3 nerve root (series 8, image 22). Mild facet spurring. No spinal stenosis. Mild bilateral L3 foraminal stenosis. L4-5: Disc desiccation with mild disc bulge. Mild facet and ligament flavum hypertrophy with trace joint effusions. No significant spinal stenosis. Mild bilateral L4 foraminal narrowing. L5-S1: Metastatic lesion with epidural extension of tumor into the ventral epidural space. Superimposed mild bilateral facet hypertrophy.  Resultant moderate canal with moderate to severe bilateral subarticular stenosis. Mild left with moderate right L5 foraminal narrowing. Overall appearance is similar to previous MRI. IMPRESSION: 1. No significant interval change in size or appearance of known metastatic lesion involving the posterior aspect of the L5 vertebral body. Associated pathologic fracture fragment better appreciated on recent CT. Epidural extension of tumor into the ventral epidural space with resultant moderate canal with moderate to severe bilateral subarticular stenosis. 2. No new metastatic disease within  the lumbar spine. 3. Mild spondylosis elsewhere within the lumbar spine as above. No other significant spinal stenosis. Mild bilateral L2 through L4 foraminal narrowing, with mild left and moderate right L5 foraminal stenosis. Electronically Signed   By: Morene Hoard M.D.   On: 11/19/2023 05:15    Scheduled Meds:    [MAR Hold] metoCLOPramide   10 mg Oral QID   [MAR Hold] mupirocin  ointment  1 Application Nasal BID   [MAR Hold] sodium chloride  flush  3 mL Intravenous Q12H    Continuous Infusions:    lactated ringers  10 mL/hr at 11/20/23 1315     LOS: 2 days     Trenda Mar, MD,  FACP, [facility], [facility], [facility]   [facility] [place]      To contact the attending provider between 7A-7P or the covering provider during after hours 7P-7A, please log into the web site [email or web address] and access using universal [place] password for that web site. If you do not have the password, please call the hospital operator.  11/20/2023, 1:30 PM

## 2023-11-20 NOTE — Transfer of Care (Signed)
 Immediate Anesthesia Transfer of Care Note  Patient: Corey Ly Sr.  Procedure(s) Performed: lumbar laminectomy right Lumbar five with decompression of nerve roots and excision of tumor (Spine Lumbar)  Patient Location: PACU  Anesthesia Type:General  Level of Consciousness: awake, alert , and oriented  Airway & Oxygen Therapy: Patient Spontanous Breathing  Post-op Assessment: Report given to RN  Post vital signs: Reviewed and stable  Last Vitals:  Vitals Value Taken Time  BP 97/49 11/20/23 1651  Temp    Pulse 78 11/20/23 1656  Resp 19 11/20/23 1656  SpO2 97 % 11/20/23 1656  Vitals shown include unfiled device data.  Last Pain:  Vitals:   11/20/23 1246  TempSrc: Oral  PainSc: 7       Patients Stated Pain Goal: 4 (11/19/23 1700)  Complications: No notable events documented.

## 2023-11-20 NOTE — Anesthesia Procedure Notes (Addendum)
 Arterial Line Insertion Start/End1/07/2024 2:42 PM, 11/20/2023 2:47 PM Performed by: Erma Thom SAUNDERS, MD, anesthesiologist  Patient location: OR. Preanesthetic checklist: patient identified, IV checked, site marked, risks and benefits discussed, surgical consent, monitors and equipment checked, pre-op evaluation, timeout performed and anesthesia consent Lidocaine  1% used for infiltration Right, radial was placed Catheter size: 22 G Hand hygiene performed  and maximum sterile barriers used   Attempts: 1 Procedure performed using ultrasound guided technique. Ultrasound Notes:anatomy identified, needle tip was noted to be adjacent to the nerve/plexus identified, no ultrasound evidence of intravascular and/or intraneural injection and image(s) printed for medical record Following insertion, dressing applied. Post procedure assessment: normal  Patient tolerated the procedure well with no immediate complications.

## 2023-11-20 NOTE — Consult Note (Signed)
 Reason for Consult: To L5 vertebrae Referring Physician: Dr. [name] Armstrong. is an 68 y.o. male.  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  He has had progressive distal lower extremities.  He is to undergo further treatment however because of the effect on the spinal canal and pressure on his L5 nerve roots he has been advised regarding surgical decompression immediately undergoes further radiation treatments.  He is now admitted to undergo surgical decompression.  Workup includes a CT scan which demonstrates that the bony structures around L5 or modestly involved is felt that we can do a simple decompression without resorting to the need to implant any surgery however given the nature of this disease patient has been advised that he may require internal fixation from L4 to the sacrum if the L5 structures and bony involvement is more extensive.  Past Medical History:  Diagnosis Date   Headache    migraines   Hepatitis    HX OF hEP c ?    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    Past Surgical History:  Procedure Laterality Date   IR RADIOLOGIST EVAL & MGMT  01/25/2020   IR RADIOLOGIST EVAL & MGMT  03/21/2020   IR RADIOLOGIST EVAL & MGMT  06/07/2020   IR RADIOLOGIST EVAL & MGMT  09/05/2020   IR RADIOLOGIST EVAL & MGMT  12/05/2020   IR RADIOLOGIST EVAL & MGMT  05/09/2021   IR RADIOLOGIST EVAL & MGMT  09/06/2021   IR RADIOLOGIST EVAL & MGMT  12/12/2021   IR RADIOLOGIST EVAL & MGMT  06/25/2022   IR RADIOLOGIST EVAL & MGMT  07/24/2022   RADIOLOGY WITH ANESTHESIA N/A 03/01/2020   Procedure: CT WITH ANESTHESIA  THERMAL MICROWAVE  ABLATIION;  Surgeon: Karalee Beat, MD;  Location: [facility];  Service: Anesthesiology;  Laterality: N/A;   RADIOLOGY WITH ANESTHESIA N/A 07/17/2022   Procedure: CT MICROWAVE ABLATION;  Surgeon: Karalee Beat POUR, MD;  Location: [facility];   Service: Radiology;  Laterality: N/A;    Family History  Problem Relation Age of Onset   Arthritis Mother    Cancer Neg Hx    Diabetes Neg Hx    Drug abuse Neg Hx    Heart disease Neg Hx    Hyperlipidemia Neg Hx    Hypertension Neg Hx    Kidney disease Neg Hx    Stroke Neg Hx     Social History:  reports that he has quit smoking. He has been exposed to tobacco smoke. He has never used smokeless tobacco. He reports that he does not currently use alcohol. He reports that he does not use drugs.  Allergies: No Known Allergies  Medications: I have reviewed the patient's current medications.  Results for orders placed or performed during the hospital encounter of 11/18/23 (from the past 48 hours)  CBC with Differential/Platelet     Status: Abnormal   Collection Time: 11/18/23  8:46 PM  Result Value Ref Range   WBC 5.5 4.0 - 10.5 K/uL   RBC 3.32 (L) 4.22 - 5.81 MIL/uL   Hemoglobin 10.9 (L) 13.0 - 17.0 g/dL   HCT 66.8 (L) 60.9 - 47.9 %   MCV 99.7 80.0 - 100.0 fL   MCH 32.8 26.0 - 34.0 pg   MCHC 32.9 30.0 - 36.0 g/dL   RDW 87.1 88.4 - 84.4 %   Platelets 197  150 - 400 K/uL   nRBC 0.0 0.0 - 0.2 %   Neutrophils Relative % 57 %   Neutro Abs 3.2 1.7 - 7.7 K/uL   Lymphocytes Relative 26 %   Lymphs Abs 1.5 0.7 - 4.0 K/uL   Monocytes Relative 11 %   Monocytes Absolute 0.6 0.1 - 1.0 K/uL   Eosinophils Relative 5 %   Eosinophils Absolute 0.3 0.0 - 0.5 K/uL   Basophils Relative 1 %   Basophils Absolute 0.0 0.0 - 0.1 K/uL   Immature Granulocytes 0 %   Abs Immature Granulocytes 0.01 0.00 - 0.07 K/uL    Comment: Performed at [facility], [address], [place], [place] [address]  Comprehensive metabolic panel     Status: Abnormal   Collection Time: 11/18/23  8:46 PM  Result Value Ref Range   Sodium 142 135 - 145 mmol/L   Potassium 4.6 3.5 - 5.1 mmol/L   Chloride 108 98 - 111 mmol/L   CO2 25 22 - 32 mmol/L   Glucose, Bld 116 (H) 70 - 99 mg/dL    Comment: Glucose reference  range applies only to samples taken after fasting for at least 8 hours.   BUN 29 (H) 8 - 23 mg/dL   Creatinine, Ser 8.54 (H) 0.61 - 1.24 mg/dL   Calcium  9.7 8.9 - 10.3 mg/dL   Total Protein 7.2 6.5 - 8.1 g/dL   Albumin 3.9 3.5 - 5.0 g/dL   AST 17 15 - 41 U/L   ALT 13 0 - 44 U/L   Alkaline Phosphatase 59 38 - 126 U/L   Total Bilirubin 0.4 0.0 - 1.2 mg/dL   GFR, Estimated 53 (L) >60 mL/min    Comment: (NOTE) Calculated using the CKD-EPI Creatinine Equation (2021)    Anion gap 9 5 - 15    Comment: Performed at [facility], [address], [place], [place] [address]  Protime-INR     Status: None   Collection Time: 11/18/23  8:46 PM  Result Value Ref Range   Prothrombin Time 14.7 11.4 - 15.2 seconds   INR 1.1 0.8 - 1.2    Comment: (NOTE) INR goal varies based on device and disease states. Performed at [facility], [address], [place], [place] [address]   HIV Antibody (routine testing w rflx)     Status: None   Collection Time: 11/18/23  8:46 PM  Result Value Ref Range   HIV Screen 4th Generation wRfx Non Reactive Non Reactive    Comment: Performed at [facility], [address] [place], [place] [address]  Comprehensive metabolic panel     Status: Abnormal   Collection Time: 11/19/23  5:42 AM  Result Value Ref Range   Sodium 141 135 - 145 mmol/L   Potassium 3.9 3.5 - 5.1 mmol/L   Chloride 107 98 - 111 mmol/L   CO2 26 22 - 32 mmol/L   Glucose, Bld 93 70 - 99 mg/dL    Comment: Glucose reference range applies only to samples taken after fasting for at least 8 hours.   BUN 32 (H) 8 - 23 mg/dL   Creatinine, Ser 8.60 (H) 0.61 - 1.24 mg/dL   Calcium  9.4 8.9 - 10.3 mg/dL   Total Protein 6.4 (L) 6.5 - 8.1 g/dL   Albumin 3.4 (L) 3.5 - 5.0 g/dL   AST 15 15 - 41 U/L   ALT 13 0 - 44 U/L   Alkaline Phosphatase 49 38 -  126 U/L   Total Bilirubin 0.4 0.0 - 1.2 mg/dL   GFR, Estimated 56 (L) >60 mL/min    Comment: (NOTE) Calculated using the CKD-EPI Creatinine  Equation (2021)    Anion gap 8 5 - 15    Comment: Performed at [facility], [address], [place], [place] [address]  CBC     Status: Abnormal   Collection Time: 11/19/23  5:42 AM  Result Value Ref Range   WBC 4.2 4.0 - 10.5 K/uL   RBC 3.07 (L) 4.22 - 5.81 MIL/uL   Hemoglobin 9.9 (L) 13.0 - 17.0 g/dL   HCT 69.5 (L) 60.9 - 47.9 %   MCV 99.0 80.0 - 100.0 fL   MCH 32.2 26.0 - 34.0 pg   MCHC 32.6 30.0 - 36.0 g/dL   RDW 87.0 88.4 - 84.4 %   Platelets 184 150 - 400 K/uL   nRBC 0.0 0.0 - 0.2 %    Comment: Performed at [facility], [address] [place], [place] [address]  Surgical PCR screen     Status: None   Collection Time: 11/19/23  8:51 PM   Specimen: Nasal Mucosa; Nasal Swab  Result Value Ref Range   MRSA, PCR NEGATIVE NEGATIVE   Staphylococcus aureus NEGATIVE NEGATIVE    Comment: (NOTE) The Xpert SA Assay (FDA approved for NASAL specimens in patients 48 years of age and older), is one component of a comprehensive surveillance program. It is not intended to diagnose infection nor to guide or monitor treatment. Performed at [facility], [address] [place], [place] [address]     ECHOCARDIOGRAM COMPLETE Result Date: 11/19/2023    ECHOCARDIOGRAM REPORT   Patient Name:   Corey Armstrong. Date of Exam: 11/19/2023 Medical Rec #:  [id number]                  Height:       67.0 in Accession #:    [id number]                 Weight:       129.3 lb Date of Birth:  Feb 06, 1956                   BSA:          1.680 m Patient Age:    67 years                   BP:           136/50 mmHg Patient Gender: M                          HR:           53 bpm. Exam Location:  Inpatient Procedure: 2D Echo, Color Doppler and Cardiac Doppler Indications:    murmur  History:        Patient has no prior history of Echocardiogram examinations.                 Risk Factors:Hypertension.  Sonographer:    Melissa Morford RDCS (AE, PE) Referring Phys: [id number] ANAND D HONGALGI IMPRESSIONS   1. Left ventricular ejection fraction, by estimation, is 60 to 65%. The left ventricle has normal function. The left ventricle has no regional wall motion abnormalities. There is moderate concentric left ventricular hypertrophy. Left ventricular diastolic parameters are consistent with Grade II diastolic dysfunction (pseudonormalization).  2. Right  ventricular systolic function is normal. The right ventricular size is normal. There is normal pulmonary artery systolic pressure.  3. Left atrial size was mildly dilated.  4. Mild mitral valve regurgitation.  5. Unable to determine if this is a bicupsid valve. AI directed towards the anterior leaflet. The aortic valve is calcified. Aortic valve regurgitation is moderate. Severe aortic valve stenosis. Aortic valve area, by VTI measures 0.85 cm. Aortic valve mean gradient measures 41.0 mmHg.  6. The inferior vena cava is normal in size with greater than 50% respiratory variability, suggesting right atrial pressure of 3 mmHg. Conclusion(s)/Recommendation(s): Recommend cardiac CT for severe AS. FINDINGS  Left Ventricle: Left ventricular ejection fraction, by estimation, is 60 to 65%. The left ventricle has normal function. The left ventricle has no regional wall motion abnormalities. The left ventricular internal cavity size was normal in size. There is  moderate concentric left ventricular hypertrophy. Left ventricular diastolic parameters are consistent with Grade II diastolic dysfunction (pseudonormalization). Right Ventricle: The right ventricular size is normal. Right ventricular systolic function is normal. There is normal pulmonary artery systolic pressure. The tricuspid regurgitant velocity is 2.32 m/s, and with an assumed right atrial pressure of 3 mmHg,  the estimated right ventricular systolic pressure is 24.5 mmHg. Left Atrium: Left atrial size was mildly dilated. Right Atrium: Right atrial size was normal in size. Pericardium: There is no evidence of  pericardial effusion. Mitral Valve: Mild mitral annular calcification. Mild mitral valve regurgitation. Tricuspid Valve: Tricuspid valve regurgitation is mild. Aortic Valve: Unable to determine if this is a bicupsid valve. AI directed towards the anterior leaflet. The aortic valve is calcified. Aortic valve regurgitation is moderate. Severe aortic stenosis is present. Aortic valve mean gradient measures 41.0 mmHg. Aortic valve peak gradient measures 17.3 mmHg. Aortic valve area, by VTI measures 0.85 cm. Pulmonic Valve: Pulmonic valve regurgitation is trivial. Aorta: The aortic root and ascending aorta are structurally normal, with no evidence of dilitation. Venous: The inferior vena cava is normal in size with greater than 50% respiratory variability, suggesting right atrial pressure of 3 mmHg. IAS/Shunts: No atrial level shunt detected by color flow Doppler.  LEFT VENTRICLE PLAX 2D LVIDd:         4.90 cm      Diastology LVIDs:         3.10 cm      LV e' medial:    6.31 cm/s LV PW:         1.20 cm      LV E/e' medial:  15.4 LV IVS:        1.20 cm      LV e' lateral:   7.07 cm/s LVOT diam:     2.00 cm      LV E/e' lateral: 13.7 LV SV:         89 LV SV Index:   53 LVOT Area:     3.14 cm  LV Volumes (MOD) LV vol d, MOD A2C: 112.0 ml LV vol d, MOD A4C: 123.0 ml LV vol s, MOD A2C: 51.3 ml LV vol s, MOD A4C: 58.6 ml LV SV MOD A2C:     60.7 ml LV SV MOD A4C:     123.0 ml LV SV MOD BP:      62.5 ml RIGHT VENTRICLE RV S prime:     12.40 cm/s TAPSE (M-mode): 2.6 cm LEFT ATRIUM           Index        RIGHT ATRIUM  Index LA diam:      3.60 cm 2.14 cm/m   RA Area:     15.00 cm LA Vol (A4C): 59.3 ml 35.30 ml/m  RA Volume:   41.90 ml  24.94 ml/m  AORTIC VALVE AV Area (Vmax):    1.92 cm AV Area (Vmean):   0.86 cm AV Area (VTI):     0.85 cm AV Vmax:           207.94 cm/s AV Vmean:          300.000 cm/s AV VTI:            1.050 m AV Peak Grad:      17.3 mmHg AV Mean Grad:      41.0 mmHg LVOT Vmax:         127.00  cm/s LVOT Vmean:        81.900 cm/s LVOT VTI:          0.284 m LVOT/AV VTI ratio: 0.27  AORTA Ao Root diam: 3.60 cm Ao Asc diam:  3.40 cm MITRAL VALVE               TRICUSPID VALVE MV Area (PHT): 3.06 cm    TR Peak grad:   21.5 mmHg MV Decel Time: 248 msec    TR Vmax:        232.00 cm/s MV E velocity: 96.90 cm/s MV A velocity: 82.60 cm/s  SHUNTS MV E/A ratio:  1.17        Systemic VTI:  0.28 m                            Systemic Diam: 2.00 cm Ronal Ross Electronically signed by Ronal Ross Signature Date/Time: 11/19/2023/1:17:02 PM    Final    MR Lumbar Spine W Wo Contrast Result Date: 11/19/2023 CLINICAL DATA:  Initial evaluation for compression fracture, known malignancy. EXAM: MRI LUMBAR SPINE WITHOUT AND WITH CONTRAST TECHNIQUE: Multiplanar and multiecho pulse sequences of the lumbar spine were obtained without and with intravenous contrast. CONTRAST:  6mL GADAVIST  GADOBUTROL  1 MMOL/ML IV SOLN COMPARISON:  Prior CT from 10/28/2023 and MRI from 09/24/2023. FINDINGS: Segmentation: Standard. Lowest well-formed disc space labeled the L5-S1 level. Alignment: Trace dextroscoliosis. Trace degenerative anterolisthesis of L2 on L3. Vertebrae: Previously identified metastatic lesion involving the posterior aspect of the L5 vertebral body again seen, not significantly changed in size or appearance since previous. Associated pathologic fracture with osseous fragment posterior to the L5 vertebral body, better appreciated on recent CT. Epidural extension with tumor extending into the ventral epidural space, little interval changed. No new metastatic lesions within the lumbar spine. Vertebral body height otherwise maintained. Bone marrow signal intensity otherwise normal. No evidence for osteomyelitis discitis or septic arthritis. Conus medullaris and cauda equina: Conus extends to the L1 level. Conus and cauda equina appear normal. Paraspinal and other soft tissues: Paraspinous soft tissues demonstrate no acute finding.  Multiple scattered T2 hyperintense cyst noted about the visualized kidneys, benign in appearance, no follow-up imaging recommended. Disc levels: L1-2:  Unremarkable. L2-3: Trace anterolisthesis. Disc desiccation with mild disc bulge and reactive endplate spurring. Mild right greater than left facet hypertrophy with small joint effusions. No significant spinal stenosis. Mild bilateral L2 foraminal narrowing. L3-4: Disc desiccation with minimal disc bulge. Superimposed small left foraminal disc protrusion closely approximates the exiting left L3 nerve root (series 8, image 22). Mild facet spurring. No spinal stenosis. Mild bilateral L3 foraminal stenosis. L4-5:  Disc desiccation with mild disc bulge. Mild facet and ligament flavum hypertrophy with trace joint effusions. No significant spinal stenosis. Mild bilateral L4 foraminal narrowing. L5-S1: Metastatic lesion with epidural extension of tumor into the ventral epidural space. Superimposed mild bilateral facet hypertrophy. Resultant moderate canal with moderate to severe bilateral subarticular stenosis. Mild left with moderate right L5 foraminal narrowing. Overall appearance is similar to previous MRI. IMPRESSION: 1. No significant interval change in size or appearance of known metastatic lesion involving the posterior aspect of the L5 vertebral body. Associated pathologic fracture fragment better appreciated on recent CT. Epidural extension of tumor into the ventral epidural space with resultant moderate canal with moderate to severe bilateral subarticular stenosis. 2. No new metastatic disease within the lumbar spine. 3. Mild spondylosis elsewhere within the lumbar spine as above. No other significant spinal stenosis. Mild bilateral L2 through L4 foraminal narrowing, with mild left and moderate right L5 foraminal stenosis. Electronically Signed   By: Morene Hoard M.D.   On: 11/19/2023 05:15    Review of Systems  Constitutional:  Positive for activity  change.  Musculoskeletal:  Positive for gait problem.  All other systems reviewed and are negative.  Blood pressure (!) 135/45, pulse 60, temperature 98.2 F (36.8 C), temperature source Oral, resp. rate 18, height 5' 7 (1.702 m), weight 64.1 kg, SpO2 99%. Physical Exam Constitutional:      Appearance: Normal appearance.  HENT:     Right Ear: Tympanic membrane, ear canal and external ear normal.     Left Ear: Tympanic membrane, ear canal and external ear normal.     Mouth/Throat:     Mouth: Mucous membranes are moist.     Pharynx: Oropharynx is clear.  Eyes:     Conjunctiva/sclera: Conjunctivae normal.     Pupils: Pupils are equal, round, and reactive to light.  Cardiovascular:     Rate and Rhythm: Normal rate and regular rhythm.     Pulses: Normal pulses.  Pulmonary:     Effort: Pulmonary effort is normal.     Breath sounds: Normal breath sounds.  Abdominal:     General: Abdomen is flat.  Musculoskeletal:        General: Normal range of motion.     Cervical back: Normal range of motion and neck supple.  Skin:    General: Skin is warm.     Capillary Refill: Capillary refill takes less than 2 seconds.  Neurological:     Mental Status: He is alert.     Comments: Weakness in the tibialis anterior on the right. 5 tibialis anterior on the left straight leg raising is positive at 15 degrees in either lower extremities.  Gastroc strength is decreased to 4 out of 5.  Reflexes in the patella and the Achilles are noted.  Upper extremity strength reflexes are normal cranial nerve examination is normal.  Psychiatric:        Mood and Affect: Mood normal.        Behavior: Behavior normal.        Thought Content: Thought content normal.        Judgment: Judgment normal.     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  Plan: Decompression of the neural elements at the L5 vertebral level.  Possible need for arthrodesis from L4 to the sacrum to be determined  at time of surgery.  Victory PARAS Petrice Beedy 11/20/2023, 7:53 AM

## 2023-11-20 NOTE — Anesthesia Postprocedure Evaluation (Signed)
 Anesthesia Post Note  Patient: Corey Karel Sr.  Procedure(s) Performed: lumbar laminectomy right Lumbar five with decompression of nerve roots and excision of tumor (Spine Lumbar)     Patient location during evaluation: PACU Anesthesia Type: General Level of consciousness: awake and alert Pain management: pain level controlled Vital Signs Assessment: post-procedure vital signs reviewed and stable Respiratory status: spontaneous breathing, nonlabored ventilation, respiratory function stable and patient connected to nasal cannula oxygen Cardiovascular status: blood pressure returned to baseline and stable Postop Assessment: no apparent nausea or vomiting Anesthetic complications: no   No notable events documented.  Last Vitals:  Vitals:   11/20/23 1652 11/20/23 1657  BP: (!) 97/49   Pulse: 78 78  Resp: 14   Temp: (P) 36.7 C   SpO2: 98% 97%    Last Pain:  Vitals:   11/20/23 1246  TempSrc: Oral  PainSc: 7                  [name]

## 2023-11-20 NOTE — Progress Notes (Signed)
 Pt returned from PACU at this time.  Alert and oriented x 4. Honeycomb dressing to lower back CDI. Bed alarm set, call bell within reach, and verbalizes understanding to call before attempting to get out of bed.

## 2023-11-20 NOTE — Plan of Care (Signed)

## 2023-11-20 NOTE — Addendum Note (Signed)
 Addendum  created 11/20/23 2055 by [place], MD   Clinical Note Signed, Cosign clinical note, Intraprocedure Blocks edited

## 2023-11-21 ENCOUNTER — Encounter (HOSPITAL_COMMUNITY): Payer: Self-pay | Admitting: Neurological Surgery

## 2023-11-21 ENCOUNTER — Other Ambulatory Visit: Payer: Self-pay | Admitting: Radiation Therapy

## 2023-11-21 DIAGNOSIS — S32050D Wedge compression fracture of fifth lumbar vertebra, subsequent encounter for fracture with routine healing: Secondary | ICD-10-CM | POA: Diagnosis not present

## 2023-11-21 DIAGNOSIS — I350 Nonrheumatic aortic (valve) stenosis: Secondary | ICD-10-CM | POA: Diagnosis not present

## 2023-11-21 DIAGNOSIS — I10 Essential (primary) hypertension: Secondary | ICD-10-CM

## 2023-11-21 LAB — CBC
HCT: 30.7 % — ABNORMAL LOW (ref 39.0–52.0)
Hemoglobin: 10.2 g/dL — ABNORMAL LOW (ref 13.0–17.0)
MCH: 32.8 pg (ref 26.0–34.0)
MCHC: 33.2 g/dL (ref 30.0–36.0)
MCV: 98.7 fL (ref 80.0–100.0)
Platelets: 176 10*3/uL (ref 150–400)
RBC: 3.11 MIL/uL — ABNORMAL LOW (ref 4.22–5.81)
RDW: 12.6 % (ref 11.5–15.5)
WBC: 11.9 10*3/uL — ABNORMAL HIGH (ref 4.0–10.5)
nRBC: 0 % (ref 0.0–0.2)

## 2023-11-21 LAB — BASIC METABOLIC PANEL
Anion gap: 12 (ref 5–15)
BUN: 32 mg/dL — ABNORMAL HIGH (ref 8–23)
CO2: 20 mmol/L — ABNORMAL LOW (ref 22–32)
Calcium: 9.4 mg/dL (ref 8.9–10.3)
Chloride: 104 mmol/L (ref 98–111)
Creatinine, Ser: 1.59 mg/dL — ABNORMAL HIGH (ref 0.61–1.24)
GFR, Estimated: 48 mL/min — ABNORMAL LOW (ref >60)
Glucose, Bld: 104 mg/dL — ABNORMAL HIGH (ref 70–99)
Potassium: 4 mmol/L (ref 3.5–5.1)
Sodium: 136 mmol/L (ref 135–145)

## 2023-11-21 MED ORDER — SENNOSIDES-DOCUSATE SODIUM 8.6-50 MG PO TABS: 1.0000 | ORAL_TABLET | Freq: Every evening | ORAL | 0 refills | Status: DC | PRN

## 2023-11-21 MED ORDER — SENNOSIDES-DOCUSATE SODIUM 8.6-50 MG PO TABS
1.0000 | ORAL_TABLET | Freq: Every evening | ORAL | 0 refills | Status: DC | PRN
Start: 2023-11-21 — End: 2023-11-21

## 2023-11-21 MED ORDER — POLYETHYLENE GLYCOL 3350 17 G PO PACK: 17.0000 g | PACK | Freq: Every day | ORAL | 0 refills | Status: DC

## 2023-11-21 NOTE — Plan of Care (Signed)
 Pt alert and oriented x 4. Complains of rt leg pain, see MAR. Iv was patent and infusing. Removed for discharge. Continent of bowel and bladder. Ambulating in room. Continues working with PT/OT. Uses cane at baseline. Discharge instructions completed with pt and ex-wife. All questions and concerns answered. Pt stable upon dischargel. Problem: Education: Goal: Knowledge of General Education information will improve Description: Including pain rating scale, medication(s)/side effects and non-pharmacologic comfort measures Outcome: Completed/Met   Problem: Health Behavior/Discharge Planning: Goal: Ability to manage health-related needs will improve Outcome: Completed/Met   Problem: Clinical Measurements: Goal: Ability to maintain clinical measurements within normal limits will improve Outcome: Completed/Met Goal: Will remain free from infection Outcome: Completed/Met Goal: Diagnostic test results will improve Outcome: Completed/Met Goal: Respiratory complications will improve Outcome: Completed/Met Goal: Cardiovascular complication will be avoided Outcome: Completed/Met   Problem: Activity: Goal: Risk for activity intolerance will decrease Outcome: Completed/Met   Problem: Nutrition: Goal: Adequate nutrition will be maintained Outcome: Completed/Met   Problem: Coping: Goal: Level of anxiety will decrease Outcome: Completed/Met   Problem: Elimination: Goal: Will not experience complications related to bowel motility Outcome: Completed/Met Goal: Will not experience complications related to urinary retention Outcome: Completed/Met   Problem: Pain Management: Goal: General experience of comfort will improve Outcome: Completed/Met   Problem: Safety: Goal: Ability to remain free from injury will improve Outcome: Completed/Met   Problem: Skin Integrity: Goal: Risk for impaired skin integrity will decrease Outcome: Completed/Met   Problem: Education: Goal: Ability to  verbalize activity precautions or restrictions will improve Outcome: Completed/Met Goal: Knowledge of the prescribed therapeutic regimen will improve Outcome: Completed/Met Goal: Understanding of discharge needs will improve Outcome: Completed/Met   Problem: Activity: Goal: Ability to avoid complications of mobility impairment will improve Outcome: Completed/Met Goal: Ability to tolerate increased activity will improve Outcome: Completed/Met Goal: Will remain free from falls Outcome: Completed/Met   Problem: Bowel/Gastric: Goal: Gastrointestinal status for postoperative course will improve Outcome: Completed/Met   Problem: Clinical Measurements: Goal: Ability to maintain clinical measurements within normal limits will improve Outcome: Completed/Met Goal: Postoperative complications will be avoided or minimized Outcome: Completed/Met Goal: Diagnostic test results will improve Outcome: Completed/Met   Problem: Pain Management: Goal: Pain level will decrease Outcome: Completed/Met   Problem: Skin Integrity: Goal: Will show signs of wound healing Outcome: Completed/Met   Problem: Health Behavior/Discharge Planning: Goal: Identification of resources available to assist in meeting health care needs will improve Outcome: Completed/Met   Problem: Bladder/Genitourinary: Goal: Urinary functional status for postoperative course will improve Outcome: Completed/Met

## 2023-11-21 NOTE — Progress Notes (Signed)
 Patient Name: Corey Scales Sr. Date of Encounter: 11/21/2023 [facility] Cardiologist: Annabella Scarce, MD (new)  Interval Summary  .    Surgery went well.  Increased R LE movement and no pain.    Vital Signs .    Vitals:   11/20/23 2039 11/21/23 0017 11/21/23 0441 11/21/23 0858  BP: (!) 121/57 115/70 130/62 118/72  Pulse: 80 72 73 79  Resp: 18 18 16 15   Temp: 98.3 F (36.8 C) 97.8 F (36.6 C) 98.6 F (37 C) 98.7 F (37.1 C)  TempSrc: Oral Axillary Oral Oral  SpO2: 98% 92% 99% 98%  Weight:   61 kg   Height:        Intake/Output Summary (Last 24 hours) at 11/21/2023 1027 Last data filed at 11/21/2023 0445 Gross per 24 hour  Intake 896.05 ml  Output 900 ml  Net -3.95 ml      11/21/2023    4:41 AM 11/20/2023    4:00 AM 11/18/2023    7:51 PM  Last 3 Weights  Weight (lbs) 134 lb 7.7 oz 141 lb 5 oz 129 lb 4.8 oz  Weight (kg) 61 kg 64.1 kg 58.65 kg      Telemetry/ECG    Sinus rhythm.  No events - Personally Reviewed  Physical Exam .    VS:  BP 118/72 (BP Location: Right Arm)   Pulse 79   Temp 98.7 F (37.1 C) (Oral)   Resp 15   Ht 5' 7 (1.702 m)   Wt 61 kg   SpO2 98%   BMI 21.06 kg/m  , BMI Body mass index is 21.06 kg/m. GENERAL:  Well appearing HEENT: Pupils equal round and reactive, fundi not visualized, oral mucosa unremarkable NECK:  No jugular venous distention, waveform within normal limits, carotid upstroke brisk and symmetric, no bruits, no thyromegaly LUNGS:  Clear to auscultation bilaterally HEART:  RRR.  PMI not displaced or sustained,S1 and S2 within normal limits, no S3, no S4, no clicks, no rubs, III/VI late-peaking systolic murmur at the LUSB ABD:  Flat, positive bowel sounds normal in frequency in pitch, no bruits, no rebound, no guarding, no midline pulsatile mass, no hepatomegaly, no splenomegaly EXT:  2 plus pulses throughout, no edema, no cyanosis no clubbing SKIN:  No rashes no nodules NEURO:  Cranial nerves II  through XII grossly intact, motor grossly intact throughout PSYCH:  Cognitively intact, oriented to person place and time   Assessment & Plan .     Corey Armstrong is a 13M with metastatic hepatocellular carcinoma, prior tobacco abuse and hypertension admitted with pathological L5 fracture with cord impingement.  Cardiology consulted for presurgical risk assessment due to new finding of severe aortic stenosis.   # Severe aortic stenosis: # Bicuspid aortic valve:  Corey Armstrong reports having a murmur since childhood.  He reports that several family members have murmurs.  Two of them had heart surgeries, but he is unsure exactly the reason for their surgery.  No history of sudden cardiac death.  He does not have any clear cardiac symptoms.  However he has been very limited physically due to his back pain and right lower extremity pain and weakness.  Therefore it is difficult to say whether or not he is symptomatic severe aortic stenosis.  He did note that when he walked to the mailbox (which is quite a long distance) he did have some shortness of breath.  Has not had any chest pain, edema, orthopnea, or PND.  His  aortic stenosis appears to be bicuspid on my review.  There is fusion of the right and noncoronary cusps.  Given his metastatic HCC, he is not a candidate for either TAVR or SAVR at this time.  He did well with palliative surgery and is euvolemic.   # Hypertension:  Home losartan /hydrochlorothiazide is on hold and BP is stable.  Recommend that he continue to hold on discharge.  Track BP at home and bring log and machine to follow up.  [facility] will sign off.   Medication Recommendations:  continue to hold BP meds Other recommendations (labs, testing, etc):  track BP Follow up as an outpatient:  we will arrange   For questions or updates, please contact [facility] Please consult [email or web address] for contact info under        Signed, Annabella Scarce, MD

## 2023-11-21 NOTE — Progress Notes (Signed)
 Patient ID: Corey Der Sr., male   DOB: 17-Oct-1956, 68 y.o.   MRN: [id number] Patient doing well with no new neuro defecit. Ok to discharge home

## 2023-11-21 NOTE — Discharge Instructions (Signed)

## 2023-11-21 NOTE — Evaluation (Signed)
 Occupational Therapy Evaluation Patient Details Name: Corey Selmer Sr. MRN: [id number] DOB: Mar 27, 1956 Today's Date: 11/21/2023   History of Present Illness 68 year old individual with hepatocellular cancer. 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 S/p 1/9 Bilateral laminotomies L5-S1 with decompression of the spinal canal and the L5 nerve roots epidural tumor.PMH: Essential HTN, Chronic pain syndrome, anemia, AKI CKD stage II/IIA   Clinical Impression   [place] was evaluated s/p the above admission list. He is indep at baseline and plans to stay at his ex-wife's house at discharge for increased support. Upon evaluation, pt demonstrated mod I ability to complete ADLs after review of spinal precautions and compensatory techniques. Pt continues to have R foot drop and benefits from RW and superivsion A for mobility. Pt does not require further acute, or follow up OT services. Recommend discharge back to pt's environment with assist as needed. OT to sign off with appreciation of order, please re-consult if needed.         If plan is discharge home, recommend the following: Assist for transportation;Assistance with cooking/housework    Functional Status Assessment  Patient has had a recent decline in their functional status and demonstrates the ability to make significant improvements in function in a reasonable and predictable amount of time.  Equipment Recommendations  None recommended by OT       Precautions / Restrictions Precautions Precautions: Fall;Back Precaution Booklet Issued: No Precaution Comments: R foot drop, reviewed spinal precautions Restrictions Weight Bearing Restrictions Per Provider Order: No Other Position/Activity Restrictions: no back brace need per  orders      Mobility Bed Mobility Overal bed mobility: Needs Assistance Bed Mobility: Rolling, Sidelying to Sit, Sit to Sidelying Rolling: Independent Sidelying to sit: Modified independent (Device/Increase time), Used rails     Sit to sidelying: Independent      Transfers Overall transfer level: Modified independent Equipment used: Rolling walker (2 wheels)               General transfer comment: vc for hand placement to power up, good power up weightshifted to stronger L LE, but able to self steady in walker      Balance Overall balance assessment: Needs assistance Sitting-balance support: Feet supported, No upper extremity supported Sitting balance-Leahy Scale: Normal     Standing balance support: Bilateral upper extremity supported, During functional activity, Reliant on assistive device for balance Standing balance-Leahy Scale: Fair                             ADL either performed or assessed with clinical judgement   ADL Overall ADL's : Modified independent             General ADL Comments: demonstrated mod I ability after review of spinal precautions and compensatory techniques     Vision Baseline Vision/History: 0 No visual deficits Vision Assessment?: No apparent visual deficits     Perception Perception: Within Functional Limits       Praxis Praxis: WFL       Pertinent Vitals/Pain Pain Assessment Pain Assessment: No/denies pain     Extremity/Trunk Assessment Upper Extremity Assessment Upper Extremity Assessment: Overall WFL for tasks assessed;Generalized weakness   Lower Extremity Assessment Lower Extremity Assessment: Defer to PT evaluation RLE Deficits /  Details: AAROM WFL, hip flex 4/5, knee ext/flex 3+/5, ankle dorsi/plantar flex 2/5 RLE Sensation: decreased light touch (greater distally) RLE Coordination: WNL   Cervical / Trunk Assessment Cervical / Trunk Assessment: Back Surgery   Communication  Communication Communication: No apparent difficulties   Cognition Arousal: Alert Behavior During Therapy: WFL for tasks assessed/performed Overall Cognitive Status: Within Functional Limits for tasks assessed               General Comments  ex-wife/healthcare POA present throughout and will be caregiver at discharge, very active in pt care     Home Living Family/patient expects to be discharged to:: Private residence Living Arrangements: Other (Comment) Available Help at Discharge: Other (Comment) Type of Home: House Home Access: Ramped entrance     Home Layout: One level     Bathroom Shower/Tub: Producer, Television/film/video: Handicapped height Bathroom Accessibility: Yes   Home Equipment: Agricultural Consultant (2 wheels);Cane - single point;Rollator (4 wheels);Shower seat;Grab bars - toilet;Grab bars - tub/shower   Additional Comments: will be staying with his ex-wife while he recovers      Prior Functioning/Environment Prior Level of Function : Independent/Modified Independent;Driving             Mobility Comments: SPC in the home, rollator outside of the home. ADLs Comments: Drives intermittently, ADLs indep, ex-wife and dtr assist as needed. Family assists with IADLs        OT Problem List: Impaired balance (sitting and/or standing)         OT Goals(Current goals can be found in the care plan section) Acute Rehab OT Goals Patient Stated Goal: home OT Goal Formulation: With patient Time For Goal Achievement: 12/05/23 Potential to Achieve Goals: Good   AM-PAC OT 6 Clicks Daily Activity     Outcome Measure Help from another person eating meals?: None Help from another person taking care of personal grooming?: None Help from another person toileting, which includes using toliet, bedpan, or urinal?: None Help from another person bathing (including washing, rinsing, drying)?: None Help from another person to put on and taking off regular upper body  clothing?: None Help from another person to put on and taking off regular lower body clothing?: None 6 Click Score: 24   End of Session Equipment Utilized During Treatment: Rolling walker (2 wheels) Nurse Communication: Mobility status  Activity Tolerance: Patient tolerated treatment well Patient left: in bed;with call bell/phone within reach;with bed alarm set;with family/visitor present  OT Visit Diagnosis: Other abnormalities of gait and mobility (R26.89)                Time: 9044-8990 OT Time Calculation (min): 14 min Charges:  OT General Charges $OT Visit: 1 Visit OT Evaluation $OT Eval Low Complexity: 1 Low  Lucie Kendall, OTR/L Acute Rehabilitation Services Office [phone] Secure Chat Communication Preferred   Lucie JONETTA Kendall 11/21/2023, 11:35 AM

## 2023-11-21 NOTE — Evaluation (Signed)
 Physical Therapy Evaluation Patient Details Name: Corey Musselman Sr. MRN: [id number] DOB: [birth year] Today's Date: 11/21/2023  History of Present Illness  68 year old individual with hepatocellular cancer. 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 S/p 1/9 Bilateral laminotomies L5-S1 with decompression of the spinal canal and the L5 nerve roots epidural tumor.PMH: Essential HTN, Chronic pain syndrome, anemia, AKI CKD stage II/IIA  Clinical Impression  PTA pt was living alone with support from his daughter and his ex-wife who is his HCPOA. Pt reports using cane and Rollator sporadically for ambulation due to R drop foot, pt reports independence in ADLs and iADLs and driving occasionally. Pt is currently limited in safe mobility by continued R LE weakness and sensation distal>proximal. Pt educated on back precautions and does a superb job of following them throughout session. Pt is mod I for bed mobility and transfers and contact guard for safety with ambulation utilizing RW for steadying and compensation due to decreased ability to dorsiflex his R foot. PT recommending OP PT and will continue to see pt acutely.       If plan is discharge home, recommend the following: A little help with walking and/or transfers;A little help with bathing/dressing/bathroom;Assistance with cooking/housework;Assist for transportation;Help with stairs or ramp for entrance   Can travel by private vehicle    Yes     Equipment Recommendations None recommended by PT     Functional Status Assessment Patient has had a recent decline in their functional status and demonstrates the ability to make significant improvements in function in a reasonable and predictable amount of time.     Precautions / Restrictions  Precautions Precautions: Fall Restrictions Weight Bearing Restrictions Per Provider Order: No Other Position/Activity Restrictions: no back brace need per orders      Mobility  Bed Mobility Overal bed mobility: Needs Assistance Bed Mobility: Rolling, Sidelying to Sit, Sit to Sidelying Rolling: Independent Sidelying to sit: Modified independent (Device/Increase time), Used rails     Sit to sidelying: Independent General bed mobility comments: able to exit and enter bed without outside assist    Transfers Overall transfer level: Modified independent Equipment used: Rolling walker (2 wheels)               General transfer comment: vc for hand placement to power up, good power up weightshifted to stronger L LE, but able to self steady in walker    Ambulation/Gait Ambulation/Gait assistance: Contact guard assist Gait Distance (Feet): 300 Feet Assistive device: Rolling walker (2 wheels) Gait Pattern/deviations: Step-through pattern, Decreased dorsiflexion - right, Decreased weight shift to right, Decreased stance time - right Gait velocity: WFL Gait velocity interpretation: 1.31 - 2.62 ft/sec, indicative of limited community ambulator   General Gait Details: contact guard for safety, pt reporting increased ability to dorsiflex his R ankle than prior to surgery        Balance Overall balance assessment: Needs assistance Sitting-balance support: Feet supported, No upper extremity supported Sitting balance-Leahy Scale: Normal     Standing balance support: Bilateral upper extremity supported, During functional activity, Reliant on assistive device for balance Standing balance-Leahy Scale: Fair Standing balance comment: benefits from UE support to maintain balance  Pertinent Vitals/Pain Pain Assessment Pain Assessment: No/denies pain    Home Living Family/patient expects to be discharged to:: Private residence Living  Arrangements: Other (Comment) (ex-wife) Available Help at Discharge: Other (Comment) (ex wife, HPOA) Type of Home: House Home Access: Ramped entrance       Home Layout: One level Home Equipment: Agricultural Consultant (2 wheels);Cane - single point;Rollator (4 wheels);Shower seat;Grab bars - toilet;Grab bars - tub/shower Additional Comments: will be staying with his ex-wife while he recovers    Prior Function Prior Level of Function : Independent/Modified Independent;Driving             Mobility Comments: SPC in the home, rollator outside of the home. ADLs Comments: Drives intermittently, ADLs indep, ex-wife and dtr assist as needed. Family assists with IADLs     Extremity/Trunk Assessment   Upper Extremity Assessment Upper Extremity Assessment: Defer to OT evaluation    Lower Extremity Assessment Lower Extremity Assessment: RLE deficits/detail RLE Deficits / Details: AAROM WFL, hip flex 4/5, knee ext/flex 3+/5, ankle dorsi/plantar flex 2/5 RLE Sensation: decreased light touch (greater distally) RLE Coordination: WNL    Cervical / Trunk Assessment Cervical / Trunk Assessment: Back Surgery  Communication   Communication Communication: No apparent difficulties  Cognition Arousal: Alert Behavior During Therapy: WFL for tasks assessed/performed Overall Cognitive Status: Within Functional Limits for tasks assessed                                          General Comments General comments (skin integrity, edema, etc.): ex-wife/healthcare POA present throughout and will be caregiver at discharge, very active in pt care        Assessment/Plan    PT Assessment Patient needs continued PT services  PT Problem List Decreased strength;Decreased range of motion;Decreased activity tolerance;Decreased balance;Decreased mobility;Impaired sensation;Pain       PT Treatment Interventions DME instruction;Gait training;Functional mobility training;Therapeutic  activities;Therapeutic exercise;Balance training;Neuromuscular re-education;Cognitive remediation;Patient/family education    PT Goals (Current goals can be found in the Care Plan section)  Acute Rehab PT Goals PT Goal Formulation: With patient/family Time For Goal Achievement: 12/05/23 Potential to Achieve Goals: Fair    Frequency Min 1X/week        AM-PAC PT 6 Clicks Mobility  Outcome Measure Help needed turning from your back to your side while in a flat bed without using bedrails?: None Help needed moving from lying on your back to sitting on the side of a flat bed without using bedrails?: None Help needed moving to and from a bed to a chair (including a wheelchair)?: None Help needed standing up from a chair using your arms (e.g., wheelchair or bedside chair)?: None Help needed to walk in hospital room?: A Little Help needed climbing 3-5 steps with a railing? : A Little 6 Click Score: 22    End of Session Equipment Utilized During Treatment: Gait belt Activity Tolerance: Patient tolerated treatment well Patient left: in bed;with call bell/phone within reach;with bed alarm set;with family/visitor present Nurse Communication: Mobility status PT Visit Diagnosis: Unsteadiness on feet (R26.81);Muscle weakness (generalized) (M62.81);Difficulty in walking, not elsewhere classified (R26.2);Other symptoms and signs involving the nervous system (R29.898)    Time: 9066-9045 PT Time Calculation (min) (ACUTE ONLY): 21 min   Charges:   PT Evaluation $PT Eval Moderate Complexity: 1 Mod   PT General Charges $$ ACUTE PT VISIT: 1 Visit  Marshal Schrecengost B. Fleeta Lapidus PT, DPT Acute Rehabilitation Services Please use secure chat or  Call Office [phone]   Almarie KATHEE Fleeta [facility] 11/21/2023, 10:39 AM

## 2023-11-21 NOTE — Discharge Summary (Addendum)
 Physician Discharge Summary  Corey Francisco Sr. FMW:[id number] DOB: 25-Nov-1955  PCP: [name], MD  Admitted from: Home Discharged to: Home  Admit date: 11/18/2023 Discharge date: 11/21/2023  Recommendations for Outpatient Follow-up:    Follow-up Information     [name], MD. Schedule an appointment as soon as possible for a visit in 1 week(s).   Specialties: Internal Medicine, Pediatrics Why: Repeat labs (CBC & BMP) Contact information: [address] [place] [address] [phone]         Colon Shove, MD. Schedule an appointment as soon as possible for a visit.   Specialty: Neurosurgery Contact information: [address] [place] [address] [phone]         [name], MD. Schedule an appointment as soon as possible for a visit.   Specialties: Hematology, Oncology Contact information: [address] [place] [address] [phone]         Patrcia Cough, MD. Schedule an appointment as soon as possible for a visit.   Specialty: Radiation Oncology Contact information: [address] [place] [address] [phone]                  Home Health: None    Equipment/Devices: None    Discharge Condition: Improved and stable.   Code Status: Full Code Diet recommendation:  Discharge Diet Orders (From admission, onward)     Start     Ordered   11/21/23 0000  Diet - low sodium heart healthy        11/21/23 1513             Discharge Diagnoses:  Principal Problem:   Vertebral compression fracture (HCC) Active Problems:   Hepatocellular carcinoma in adult [facility])   Metastatic cancer to bone [facility])   Essential hypertension   Chronic pain syndrome   Chronic nausea   Acute kidney injury superimposed on chronic kidney disease (HCC)   Anemia of chronic disease   Severe aortic stenosis   Brief Hospital Course:  68 year old male, lives alone, ambulates with  the help of a cane at times while at home and wheelchair when outside, medical history significant for metastatic hepatocellular carcinoma, 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 Thayer office (neurosurgery) for potential surgical fixation of pathological L5 fracture.  Underwent preop workup, TTE revealed severe aortic stenosis, cardiology consulted and recommended proceeding with needed surgery with close perioperative monitoring with the understanding that he is high risk.  He underwent bilateral laminotomies L5-S1 with decompression of the spinal canal and the L5 nerve roots epidural tumor by Dr. Colon on 1/9.     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 Lanny, oncology outpatient for the management of hepatocellular carcinoma.  Recently he has been referred to radiation oncologist Dr. Patrcia and interventional radiologist for needed ablation and vertebral body cement injection.  Seen by Dr. Colon, neurosurgery in office on 1/7 and sent to [facility] as direct admission for expedited workup and possible surgical intervention Given prominent murmur on physical exam, dyspnea on exertion for last 3 to 4 months, getting TTE and cardiology consulted for preop clearance. TTE 1/8 appreciated.  Cardiology input noted.  As per cardiology, patient has severe aortic stenosis, this increases perioperative risk but he is not a candidate for either TAVR or  SAVR at this time and any procedures likely would significantly delay his ability to have his pathological fractures addressed and without surgery his quality of life is significantly affected.  Thereby they recommended proceeding with surgery with the understanding that his surgical risk is high, recommended avoiding hypotension during surgery and in the  perioperative period and maintain SBP >130. After preop cardiac clearance, patient underwent bilateral laminotomies L5-S1 with decompression of the spinal canal and the L5 nerve roots epidural tumor by Dr. Colon on 1/9. Postop day 1, pain is better, reports improved strength in his right lower extremity.  Therapies have seen and recommend outpatient PT.  Dr. Colon has seen and cleared for DC with outpatient follow-up with him in a couple of weeks and outpatient follow-up with radiation oncology.  Addendum: Surgical Pathology results.  FINAL MICROSCOPIC DIAGNOSIS:   A. BONE, L5, BIOPSY:  Metastatic carcinoma consistent with clinical history of known hepatocellular carcinoma.    Severe aortic stenosis Murmur reported since childhood.  He tells me that this may be hereditary and runs in the family No recent echo in system.  Given murmur, DOE for last 3 to 4 months, requested TTE to evaluate and cardiology consulted. TTE 1/8: LVEF 60-65%, grade 2 diastolic dysfunction severe aortic valve stenosis and moderate aortic valve regurgitation. Cardiology followed up on day of discharge.  They reiterate that given his metastatic HCC, he is not a candidate for either TAVR or SAVR at this time.  They recommend continuing to hold losartan /HCTZ, patient to track BP at home and bring log and machine to follow-up with cardiology.  They will arrange outpatient follow-up.   Metastatic hepatocellular carcinoma Patient has been managed by oncology Dr. Lanny outpatient. CT lumbar spine from 10/28/23 showed known metastatic lesion of L5 with a displaced osseous fragment projecting dorsally from posterior inferior corner and extending 9 mm into the spinal canal. Severe right neural foraminal and moderate spinal canal stenosis at L5-S1. Outpatient follow-up with medical and radiation oncology.   Essential hypertension Blood pressure is controlled off of meds. Given severe aortic stenosis and CKD, continue to hold  Hyzaar at discharge until close outpatient follow-up.   Nausea secondary to cancer Continue as needed antiemetics.    Chronic pain syndrome cancer Continue home regimen include hydrocodone  twice daily, oxycodone  as needed for severe pain.   Anemia of chronic disease Baseline hemoglobin of 11-12, presented with hemoglobin of 10.9, stable Suspect anemia of malignancy, chronic disease and hemodilution   Stage II chronic kidney disease Baseline creatinine between 1.2-1.3 Presented with creatinine of 1.45 which is improved to 1.39-1.38 Does not meet criteria for AKI. Creatinine somewhat labile in the hospital, up from 1.38-1.59.  Encourage oral fluid intake.  Follow BMP closely as outpatient.  Avoid nephrotoxics.  Discontinued Hyzaar and ibuprofen  listed on his home meds.   Body mass index is 22.13 kg/m.      Consultants:   Neurosurgery-Dr. Colon Cardiology for preop clearance   Procedures:   As above.   Discharge Instructions  Discharge Instructions     Call MD for:  redness, tenderness, or signs of infection (pain, swelling, redness, odor or green/yellow discharge around incision site)   Complete by: As directed    Call MD for:  severe uncontrolled pain   Complete by: As directed    Call MD for:  temperature >100.4   Complete by: As directed    Diet - low sodium heart healthy   Complete by: As directed    Discharge instructions  Complete by: As directed    Okay to shower. Do not apply salves or appointments to incision. No heavy lifting with the upper extremities greater than 10 pounds. May resume driving when not requiring pain medication and patient feels comfortable with doing so.   Incentive spirometry RT   Complete by: As directed    Increase activity slowly   Complete by: As directed         Medication List     STOP taking these medications    ibuprofen  100 MG tablet Commonly known as: ADVIL    losartan -hydrochlorothiazide 100-25 MG tablet Commonly  known as: HYZAAR   metoCLOPramide  10 MG tablet Commonly known as: REGLAN    ondansetron  8 MG tablet Commonly known as: ZOFRAN        TAKE these medications    gabapentin  300 MG capsule Commonly known as: NEURONTIN  Take 2 capsules (600 mg) in the morning, 2 capsules (600mg ) midday, 3 capsules (900 mg) at bedtime.   HYDROcodone  Bitartrate ER 20 MG Cp12 Take 1 capsule by mouth every 12 (twelve) hours.   ondansetron  4 MG disintegrating tablet Commonly known as: ZOFRAN -ODT Take 1 tablet (4 mg total) by mouth every 8 (eight) hours as needed for nausea or vomiting.   oxyCODONE  5 MG immediate release tablet Commonly known as: Oxy IR/ROXICODONE  Take 1-2 tablets (5-10 mg total) by mouth every 6 (six) hours as needed for severe pain (pain score 7-10).   pantoprazole  40 MG tablet Commonly known as: Protonix  Take 1 tablet (40 mg total) by mouth daily.   polyethylene glycol 17 g packet Commonly known as: MIRALAX  / GLYCOLAX  Take 17 g by mouth daily.   prochlorperazine  10 MG tablet Commonly known as: COMPAZINE  Take 1 tablet (10 mg total) by mouth every 6 (six) hours as needed for nausea or vomiting.   senna-docusate 8.6-50 MG tablet Commonly known as: Senokot-S Take 1 tablet by mouth at bedtime as needed for mild constipation or moderate constipation.       No Known Allergies    Procedures/Studies: DG Lumbar Spine 1 View Result Date: 11/20/2023 CLINICAL DATA:  Right L5 laminectomy. EXAM: LUMBAR SPINE - 1 VIEW COMPARISON:  MRI of November 19, 2023. FINDINGS: Single intraoperative cross-table lateral projection was obtained of the lumbar spine. Surgical probe is directed toward posterior elements of L5. IMPRESSION: Surgical localization as described above. Electronically Signed   By: Lynwood Landy Raddle M.D.   On: 11/20/2023 17:50   ECHOCARDIOGRAM COMPLETE Result Date: 11/19/2023    ECHOCARDIOGRAM REPORT   Patient Name:   Corey Maish Sr. Date of Exam: 11/19/2023 Medical Rec #:   [id number]                  Height:       67.0 in Accession #:    [id number]                 Weight:       129.3 lb Date of Birth:  [birth year]                   BSA:          1.680 m Patient Age:    67 years                   BP:           136/50 mmHg Patient Gender: M  HR:           53 bpm. Exam Location:  Inpatient Procedure: 2D Echo, Color Doppler and Cardiac Doppler Indications:    murmur  History:        Patient has no prior history of Echocardiogram examinations.                 Risk Factors:Hypertension.  Sonographer:    Melissa Morford RDCS (AE, PE) Referring Phys: [id number] Janine Reller D Layza Summa IMPRESSIONS  1. Left ventricular ejection fraction, by estimation, is 60 to 65%. The left ventricle has normal function. The left ventricle has no regional wall motion abnormalities. There is moderate concentric left ventricular hypertrophy. Left ventricular diastolic parameters are consistent with Grade II diastolic dysfunction (pseudonormalization).  2. Right ventricular systolic function is normal. The right ventricular size is normal. There is normal pulmonary artery systolic pressure.  3. Left atrial size was mildly dilated.  4. Mild mitral valve regurgitation.  5. Unable to determine if this is a bicupsid valve. AI directed towards the anterior leaflet. The aortic valve is calcified. Aortic valve regurgitation is moderate. Severe aortic valve stenosis. Aortic valve area, by VTI measures 0.85 cm. Aortic valve mean gradient measures 41.0 mmHg.  6. The inferior vena cava is normal in size with greater than 50% respiratory variability, suggesting right atrial pressure of 3 mmHg. Conclusion(s)/Recommendation(s): Recommend cardiac CT for severe AS. FINDINGS  Left Ventricle: Left ventricular ejection fraction, by estimation, is 60 to 65%. The left ventricle has normal function. The left ventricle has no regional wall motion abnormalities. The left ventricular internal cavity size was normal in size.  There is  moderate concentric left ventricular hypertrophy. Left ventricular diastolic parameters are consistent with Grade II diastolic dysfunction (pseudonormalization). Right Ventricle: The right ventricular size is normal. Right ventricular systolic function is normal. There is normal pulmonary artery systolic pressure. The tricuspid regurgitant velocity is 2.32 m/s, and with an assumed right atrial pressure of 3 mmHg,  the estimated right ventricular systolic pressure is 24.5 mmHg. Left Atrium: Left atrial size was mildly dilated. Right Atrium: Right atrial size was normal in size. Pericardium: There is no evidence of pericardial effusion. Mitral Valve: Mild mitral annular calcification. Mild mitral valve regurgitation. Tricuspid Valve: Tricuspid valve regurgitation is mild. Aortic Valve: Unable to determine if this is a bicupsid valve. AI directed towards the anterior leaflet. The aortic valve is calcified. Aortic valve regurgitation is moderate. Severe aortic stenosis is present. Aortic valve mean gradient measures 41.0 mmHg. Aortic valve peak gradient measures 17.3 mmHg. Aortic valve area, by VTI measures 0.85 cm. Pulmonic Valve: Pulmonic valve regurgitation is trivial. Aorta: The aortic root and ascending aorta are structurally normal, with no evidence of dilitation. Venous: The inferior vena cava is normal in size with greater than 50% respiratory variability, suggesting right atrial pressure of 3 mmHg. IAS/Shunts: No atrial level shunt detected by color flow Doppler.  LEFT VENTRICLE PLAX 2D LVIDd:         4.90 cm      Diastology LVIDs:         3.10 cm      LV e' medial:    6.31 cm/s LV PW:         1.20 cm      LV E/e' medial:  15.4 LV IVS:        1.20 cm      LV e' lateral:   7.07 cm/s LVOT diam:     2.00 cm  LV E/e' lateral: 13.7 LV SV:         89 LV SV Index:   53 LVOT Area:     3.14 cm  LV Volumes (MOD) LV vol d, MOD A2C: 112.0 ml LV vol d, MOD A4C: 123.0 ml LV vol s, MOD A2C: 51.3 ml LV vol s,  MOD A4C: 58.6 ml LV SV MOD A2C:     60.7 ml LV SV MOD A4C:     123.0 ml LV SV MOD BP:      62.5 ml RIGHT VENTRICLE RV S prime:     12.40 cm/s TAPSE (M-mode): 2.6 cm LEFT ATRIUM           Index        RIGHT ATRIUM           Index LA diam:      3.60 cm 2.14 cm/m   RA Area:     15.00 cm LA Vol (A4C): 59.3 ml 35.30 ml/m  RA Volume:   41.90 ml  24.94 ml/m  AORTIC VALVE AV Area (Vmax):    1.92 cm AV Area (Vmean):   0.86 cm AV Area (VTI):     0.85 cm AV Vmax:           207.94 cm/s AV Vmean:          300.000 cm/s AV VTI:            1.050 m AV Peak Grad:      17.3 mmHg AV Mean Grad:      41.0 mmHg LVOT Vmax:         127.00 cm/s LVOT Vmean:        81.900 cm/s LVOT VTI:          0.284 m LVOT/AV VTI ratio: 0.27  AORTA Ao Root diam: 3.60 cm Ao Asc diam:  3.40 cm MITRAL VALVE               TRICUSPID VALVE MV Area (PHT): 3.06 cm    TR Peak grad:   21.5 mmHg MV Decel Time: 248 msec    TR Vmax:        232.00 cm/s MV E velocity: 96.90 cm/s MV A velocity: 82.60 cm/s  SHUNTS MV E/A ratio:  1.17        Systemic VTI:  0.28 m                            Systemic Diam: 2.00 cm Ronal Ross Electronically signed by Ronal Ross Signature Date/Time: 11/19/2023/1:17:02 PM    Final    MR Lumbar Spine W Wo Contrast Result Date: 11/19/2023 CLINICAL DATA:  Initial evaluation for compression fracture, known malignancy. EXAM: MRI LUMBAR SPINE WITHOUT AND WITH CONTRAST TECHNIQUE: Multiplanar and multiecho pulse sequences of the lumbar spine were obtained without and with intravenous contrast. CONTRAST:  6mL GADAVIST  GADOBUTROL  1 MMOL/ML IV SOLN COMPARISON:  Prior CT from 10/28/2023 and MRI from 09/24/2023. FINDINGS: Segmentation: Standard. Lowest well-formed disc space labeled the L5-S1 level. Alignment: Trace dextroscoliosis. Trace degenerative anterolisthesis of L2 on L3. Vertebrae: Previously identified metastatic lesion involving the posterior aspect of the L5 vertebral body again seen, not significantly changed in size or appearance since  previous. Associated pathologic fracture with osseous fragment posterior to the L5 vertebral body, better appreciated on recent CT. Epidural extension with tumor extending into the ventral epidural space, little interval changed. No new metastatic lesions within the lumbar spine. Vertebral  body height otherwise maintained. Bone marrow signal intensity otherwise normal. No evidence for osteomyelitis discitis or septic arthritis. Conus medullaris and cauda equina: Conus extends to the L1 level. Conus and cauda equina appear normal. Paraspinal and other soft tissues: Paraspinous soft tissues demonstrate no acute finding. Multiple scattered T2 hyperintense cyst noted about the visualized kidneys, benign in appearance, no follow-up imaging recommended. Disc levels: L1-2:  Unremarkable. L2-3: Trace anterolisthesis. Disc desiccation with mild disc bulge and reactive endplate spurring. Mild right greater than left facet hypertrophy with small joint effusions. No significant spinal stenosis. Mild bilateral L2 foraminal narrowing. L3-4: Disc desiccation with minimal disc bulge. Superimposed small left foraminal disc protrusion closely approximates the exiting left L3 nerve root (series 8, image 22). Mild facet spurring. No spinal stenosis. Mild bilateral L3 foraminal stenosis. L4-5: Disc desiccation with mild disc bulge. Mild facet and ligament flavum hypertrophy with trace joint effusions. No significant spinal stenosis. Mild bilateral L4 foraminal narrowing. L5-S1: Metastatic lesion with epidural extension of tumor into the ventral epidural space. Superimposed mild bilateral facet hypertrophy. Resultant moderate canal with moderate to severe bilateral subarticular stenosis. Mild left with moderate right L5 foraminal narrowing. Overall appearance is similar to previous MRI. IMPRESSION: 1. No significant interval change in size or appearance of known metastatic lesion involving the posterior aspect of the L5 vertebral body.  Associated pathologic fracture fragment better appreciated on recent CT. Epidural extension of tumor into the ventral epidural space with resultant moderate canal with moderate to severe bilateral subarticular stenosis. 2. No new metastatic disease within the lumbar spine. 3. Mild spondylosis elsewhere within the lumbar spine as above. No other significant spinal stenosis. Mild bilateral L2 through L4 foraminal narrowing, with mild left and moderate right L5 foraminal stenosis. Electronically Signed   By: Morene Hoard M.D.   On: 11/19/2023 05:15   CT Lumbar Spine Wo Contrast Result Date: 10/28/2023 CLINICAL DATA:  Spinal metastatic disease.  Surgical planning. EXAM: CT LUMBAR SPINE WITHOUT CONTRAST TECHNIQUE: Multidetector CT imaging of the lumbar spine was performed without intravenous contrast administration. Multiplanar CT image reconstructions were also generated. RADIATION DOSE REDUCTION: This exam was performed according to the departmental dose-optimization program which includes automated exposure control, adjustment of the mA and/or kV according to patient size and/or use of iterative reconstruction technique. COMPARISON:  Lumbar spine MRI 09/24/2023 FINDINGS: Segmentation: 5 lumbar type vertebrae. Alignment: Normal. Vertebrae: Known metastatic lesion of L5 with a displaced osseous fragment projecting dorsally from the posteroinferior corner, extending 9 mm into the spinal canal. This is a case within soft tissue. Paraspinal and other soft tissues: Calcific aortic atherosclerosis. Multiple bilateral renal calcifications. Disc levels: Spinal canal and neural foraminal stenosis at L5-S1 is better characterized on the MRI. Briefly, there is persistent severe right neural foraminal stenosis and moderate spinal canal stenosis. IMPRESSION: 1. Known metastatic lesion of L5 with a displaced osseous fragment projecting dorsally from the posteroinferior corner, extending 9 mm into the spinal canal. 2.  Severe right neural foraminal and moderate spinal canal stenosis at L5-S1. Aortic Atherosclerosis (ICD10-I70.0). Electronically Signed   By: Franky Stanford M.D.   On: 10/28/2023 19:39      Subjective: Seen this morning.  Significant other at bedside.  Feels much improved.  Significantly improved right lower extremity pain and strength.  Discharge Exam:  Vitals:   11/21/23 0441 11/21/23 0858 11/21/23 1226 11/21/23 1518  BP: 130/62 118/72 121/60 129/64  Pulse: 73 79 79 79  Resp: 16 15 16 16   Temp: 98.6 F (  37 C) 98.7 F (37.1 C) 99.3 F (37.4 C) 99.4 F (37.4 C)  TempSrc: Oral Oral Oral Oral  SpO2: 99% 98% 100% 98%  Weight: 61 kg     Height:        General exam: Middle-age male, moderately built, thinly nourished, lying comfortably propped up in bed without distress. Respiratory system: Clear to auscultation. Respiratory effort normal. Cardiovascular system: S1 & S2 heard, RRR. No JVD, rubs, gallops or clicks. No pedal edema.  3/6 blowing systolic murmur best heard at right lower sternal border,?  Diastolic murmur as well.  Telemetry personally reviewed: Sinus rhythm. Gastrointestinal system: Abdomen is nondistended, soft and nontender. No organomegaly or masses felt. Normal bowel sounds heard. Central nervous system: Alert and oriented. No focal neurological deficits. Extremities: Symmetric 5 x 5 power except RLE with grade 4+/5 power and right foot drop, may be now able to dorsiflex his toes slightly but not sure. Skin: No rashes, lesions or ulcers Psychiatry: Judgement and insight appear normal. Mood & affect appropriate.     The results of significant diagnostics from this hospitalization (including imaging, microbiology, ancillary and laboratory) are listed below for reference.     Microbiology: Recent Results (from the past 240 hours)  Surgical PCR screen     Status: None   Collection Time: 11/19/23  8:51 PM   Specimen: Nasal Mucosa; Nasal Swab  Result Value Ref Range  Status   MRSA, PCR NEGATIVE NEGATIVE Final   Staphylococcus aureus NEGATIVE NEGATIVE Final    Comment: (NOTE) The Xpert SA Assay (FDA approved for NASAL specimens in patients 75 years of age and older), is one component of a comprehensive surveillance program. It is not intended to diagnose infection nor to guide or monitor treatment. Performed at [facility], [address] [place], [place] [address]      Labs: CBC: Recent Labs  Lab 11/18/23 2046 11/19/23 0542 11/20/23 0714 11/21/23 0653  WBC 5.5 4.2 6.1 11.9*  NEUTROABS 3.2  --   --   --   HGB 10.9* 9.9* 10.9* 10.2*  HCT 33.1* 30.4* 32.8* 30.7*  MCV 99.7 99.0 97.3 98.7  PLT 197 184 181 176    Basic Metabolic Panel: Recent Labs  Lab 11/18/23 2046 11/19/23 0542 11/20/23 0714 11/21/23 0653  NA 142 141 139 136  K 4.6 3.9 4.0 4.0  CL 108 107 105 104  CO2 25 26 24  20*  GLUCOSE 116* 93 100* 104*  BUN 29* 32* 30* 32*  CREATININE 1.45* 1.39* 1.38* 1.59*  CALCIUM  9.7 9.4 9.9 9.4    Liver Function Tests: Recent Labs  Lab 11/18/23 2046 11/19/23 0542  AST 17 15  ALT 13 13  ALKPHOS 59 49  BILITOT 0.4 0.4  PROT 7.2 6.4*  ALBUMIN 3.9 3.4*      Time coordinating discharge: 25 minutes  SIGNED:  Trenda Mar, MD,  FACP, [facility], [facility], [facility]   [facility] [place]     To contact the attending provider between 7A-7P or the covering provider during after hours 7P-7A, please log into the web site [email or web address] and access using universal [employer] password for that web site. If you do not have the password, please call the hospital operator.

## 2023-11-24 ENCOUNTER — Telehealth: Payer: Self-pay | Admitting: Radiation Oncology

## 2023-11-24 ENCOUNTER — Inpatient Hospital Stay: Payer: Commercial Managed Care - PPO

## 2023-11-24 LAB — SURGICAL PATHOLOGY

## 2023-11-24 NOTE — Telephone Encounter (Signed)
 Pt's family member called about scheduling more appts with Dr. Patrcia. Call transferred to Nurse Navigator Devere who last spoke to pt 1/7. Family member was advised our team has not received any instructions on what needs to be scheduled but Devere may know more about his case.

## 2023-11-26 ENCOUNTER — Ambulatory Visit (HOSPITAL_COMMUNITY): Payer: Commercial Managed Care - PPO

## 2023-11-26 ENCOUNTER — Encounter (HOSPITAL_COMMUNITY): Payer: Self-pay

## 2023-11-27 ENCOUNTER — Other Ambulatory Visit: Payer: Commercial Managed Care - PPO

## 2023-12-02 NOTE — Progress Notes (Signed)
Radiation Oncology         [phone] ________________________________  Outpatient Re-Consultation  Name: Corey Sweet Sr. 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     ICD-10-CM   1. Metastatic cancer to spine (HCC)  C79.51     2. Metastasis to bone [facility])  C79.51       HISTORY OF PRESENT ILLNESS: Corey Hohensee Sr. is a 68 y.o. male that is well known to our service, initially seen 06/13/23, at the request of Dr. Mosetta Putt.   In summary, he was diagnosed with hepatocellular carcinoma in January of 2021 by MRI. He underwent microwave ablation on 03/01/20. Follow-up MRI on 06/01/22 was suspicious for local recurrence and metastatic disease to L5. Biopsy of L5 confirmed HCC. Patient underwent repeat microwave ablation on 07/17/22 and was started on Lenvima on 08/16/22 under the care of Dr. Mosetta Putt. Unfortunately, the L5 bone met became painful and was shown to have increased in size on lumbar spine MRI on 06/07/23. He was referred to [place] to discuss potential palliative radiation therapy to the L5 lesion. He was subsequently treated with 5 fractions of SBRT, completed on 06/27/23. His systemic treatment was also changed to durvalumab with one dose of Imjudo on 06/30/23.  Most recently, the patient developed RLE pain and weakness in 09/2023. He underwent lumbar spine MRI on 09/24/23 showing no significant interval change in the disease at L5 despite prior radiation. Therefore, he was referred to Dr. Danielle Dess in neurosurgery to discuss treatment options. The recommendation was to proceed with laminectomy and decompression of the spinal canal. He underwent a pre-op lumbar spine CT on 10/28/23 showing the known metastatic lesion of L5 with a displaced osseous fragment  projecting dorsally from posteroinferior corner, extending 9 mm into the spinal canal. He was taken for inpatient bilateral laminectoies at L5-S1 with decompression of the spinal canal and excision of epidural tumor surrounding the L5 nerve roots on 11/19/22, under the care of Dr. Danielle Dess. Pathology from L5 again showed metastatic carcinoma consistent with known hepatocellular carcinoma. He has been recovering in standard fashion and has had significant improvement in his pain.  He has kindly been referred back to [place] today, by Dr. Danielle Dess, for consideration of post-operative radiotherapy to the surgical site to prevent tumor regrowth.   PREVIOUS RADIATION THERAPY: Yes  06/17/23 - 06/27/23: L5 / 50 Gy in 5 fractions (SBRT)  PAST MEDICAL HISTORY:  Past Medical History:  Diagnosis Date   Headache    migraines   Hepatitis    HX OF hEP c ?    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      PAST SURGICAL HISTORY: Past Surgical History:  Procedure Laterality Date   DECOMPRESSIVE LUMBAR LAMINECTOMY LEVEL 1 N/A 11/20/2023   Procedure: lumbar laminectomy right Lumbar five with decompression of nerve roots and excision of tumor;  Surgeon: [name], MD;  Location: [facility] OR;  Service: Neurosurgery;  Laterality: N/A;   IR RADIOLOGIST EVAL & MGMT  01/25/2020   IR RADIOLOGIST EVAL & MGMT  03/21/2020   IR RADIOLOGIST EVAL & MGMT  06/07/2020   IR RADIOLOGIST EVAL & MGMT  09/05/2020   IR RADIOLOGIST EVAL & MGMT  12/05/2020   IR RADIOLOGIST EVAL &  MGMT  05/09/2021   IR RADIOLOGIST EVAL & MGMT  09/06/2021   IR RADIOLOGIST EVAL & MGMT  12/12/2021   IR RADIOLOGIST EVAL & MGMT  06/25/2022   IR RADIOLOGIST EVAL & MGMT  07/24/2022   RADIOLOGY WITH ANESTHESIA N/A 03/01/2020   Procedure: CT WITH ANESTHESIA  THERMAL MICROWAVE  ABLATIION;  Surgeon: [name], MD;  Location: [facility] ORS;  Service: Anesthesiology;  Laterality: N/A;   RADIOLOGY WITH ANESTHESIA N/A 07/17/2022   Procedure: CT  MICROWAVE ABLATION;  Surgeon: [name], MD;  Location: [facility] ORS;  Service: Radiology;  Laterality: N/A;    FAMILY HISTORY:  Family History  Problem Relation Age of Onset   Arthritis Mother    Cancer Neg Hx    Diabetes Neg Hx    Drug abuse Neg Hx    Heart disease Neg Hx    Hyperlipidemia Neg Hx    Hypertension Neg Hx    Kidney disease Neg Hx    Stroke Neg Hx     SOCIAL HISTORY:  Social History   Socioeconomic History   Marital status: Divorced    Spouse name: Not on file   Number of children: 2   Years of education: 12   Highest education level: 12th grade  Occupational History   Occupation: Clinical biochemist: [employer]    Comment: Out on disability for now  Tobacco Use   Smoking status: Former    Passive exposure: Current   Smokeless tobacco: Never   Tobacco comments:    3 per week  Vaping Use   Vaping status: Never Used  Substance and Sexual Activity   Alcohol use: Not Currently   Drug use: No   Sexual activity: Not Currently  Other Topics Concern   Not on file  Social History Narrative   Divorced but has relationship with ex   2 children   Daughter lives with him   Son in [place]      No living will   Ex wife Myraette should make decisions on his behalf   Would accept resuscitation   Not sure about tube feeds   Social Drivers of Health   Financial Resource Strain: Medium Risk (11/06/2023)   Overall Financial Resource Strain (CARDIA)    Difficulty of Paying Living Expenses: Somewhat hard  Food Insecurity: No Food Insecurity (11/18/2023)   Hunger Vital Sign    Worried About Running Out of Food in the Last Year: Never true    Ran Out of Food in the Last Year: Never true  Transportation Needs: No Transportation Needs (11/18/2023)   PRAPARE - Administrator, Civil Service (Medical): No    Lack of Transportation (Non-Medical): No  Physical Activity: Sufficiently Active (11/06/2023)   Exercise Vital Sign    Days of  Exercise per Week: 7 days    Minutes of Exercise per Session: 30 min  Stress: No Stress Concern Present (11/06/2023)   [employer] of Occupational Health - Occupational Stress Questionnaire    Feeling of Stress : Only a little  Social Connections: Moderately Integrated (11/18/2023)   Social Connection and Isolation Panel [NHANES]    Frequency of Communication with Friends and Family: Three times a week    Frequency of Social Gatherings with Friends and Family: More than three times a week    Attends Religious Services: More than 4 times per year    Active Member of [employer] or Organizations: Yes    Attends Banker Meetings:  More than 4 times per year    Marital Status: Divorced  Intimate Partner Violence: Not At Risk (11/18/2023)   Humiliation, Afraid, Rape, and Kick questionnaire    Fear of Current or Ex-Partner: No    Emotionally Abused: No    Physically Abused: No    Sexually Abused: No    ALLERGIES: Patient has no known allergies.  MEDICATIONS:  Current Outpatient Medications  Medication Sig Dispense Refill   gabapentin (NEURONTIN) 300 MG capsule Take 2 capsules (600 mg) in the morning, 2 capsules (600mg ) midday, 3 capsules (900 mg) at bedtime. 210 capsule 1   HYDROcodone Bitartrate ER 20 MG CP12 Take 1 capsule by mouth every 12 (twelve) hours. 60 capsule 0   ondansetron (ZOFRAN-ODT) 4 MG disintegrating tablet Take 1 tablet (4 mg total) by mouth every 8 (eight) hours as needed for nausea or vomiting. 20 tablet 0   oxyCODONE (OXY IR/ROXICODONE) 5 MG immediate release tablet Take 1-2 tablets (5-10 mg total) by mouth every 6 (six) hours as needed for severe pain (pain score 7-10). 45 tablet 0   pantoprazole (PROTONIX) 40 MG tablet Take 1 tablet (40 mg total) by mouth daily. 30 tablet 3   polyethylene glycol (MIRALAX / GLYCOLAX) 17 g packet Take 17 g by mouth daily. 30 each 0   prochlorperazine (COMPAZINE) 10 MG tablet Take 1 tablet (10 mg total) by mouth every 6 (six)  hours as needed for nausea or vomiting. 30 tablet 0   senna-docusate (SENOKOT-S) 8.6-50 MG tablet Take 1 tablet by mouth at bedtime as needed for mild constipation or moderate constipation. 30 tablet 0   No current facility-administered medications for this encounter.    REVIEW OF SYSTEMS:  On review of systems, the patient reports that he is doing well overall. He denies any chest pain, shortness of breath, cough, fevers, chills, night sweats, or recent unintended weight changes. He denies any bowel or bladder disturbances, and denies abdominal pain, nausea or vomiting. He has had significant improvement in his low back pain and denies any new musculoskeletal or joint aches or pains. He denies paraesthesias or weakness in the upper or lower extremities. A complete review of systems is obtained and is otherwise negative.    PHYSICAL EXAM:  Wt Readings from Last 3 Encounters:  12/05/23 129 lb (58.5 kg)  11/21/23 134 lb 7.7 oz (61 kg)  11/07/23 130 lb (59 kg)   Temp Readings from Last 3 Encounters:  12/05/23 97.6 F (36.4 C) (Temporal)  11/21/23 99.4 F (37.4 C) (Oral)  11/07/23 98.3 F (36.8 C) (Oral)   BP Readings from Last 3 Encounters:  12/05/23 (!) 158/63  11/21/23 129/64  11/07/23 118/60   Pulse Readings from Last 3 Encounters:  12/05/23 84  11/21/23 79  11/07/23 68   Pain Assessment Pain Score: 6  Pain Loc: Back (L5)/10  In general this is a well appearing African American man in no acute distress. He's alert and oriented x4 and appropriate throughout the examination. Cardiopulmonary assessment is negative for acute distress and he exhibits normal effort.     KPS = 70  100 - Normal; no complaints; no evidence of disease. 90   - Able to carry on normal activity; minor signs or symptoms of disease. 80   - Normal activity with effort; some signs or symptoms of disease. 65   - Cares for self; unable to carry on normal activity or to do active work. 60   - Requires  occasional assistance, but is  able to care for most of his personal needs. 50   - Requires considerable assistance and frequent medical care. 40   - Disabled; requires special care and assistance. 30   - Severely disabled; hospital admission is indicated although death not imminent. 20   - Very sick; hospital admission necessary; active supportive treatment necessary. 10   - Moribund; fatal processes progressing rapidly. 0     - Dead  Karnofsky DA, Abelmann WH, Craver LS and Burchenal [facility] [phone]) The use of the nitrogen mustards in the palliative treatment of carcinoma: with particular reference to bronchogenic carcinoma Cancer 1 634-56  LABORATORY DATA:  Lab Results  Component Value Date   WBC 11.9 (H) 11/21/2023   HGB 10.2 (L) 11/21/2023   HCT 30.7 (L) 11/21/2023   MCV 98.7 11/21/2023   PLT 176 11/21/2023   Lab Results  Component Value Date   NA 136 11/21/2023   K 4.0 11/21/2023   CL 104 11/21/2023   CO2 20 (L) 11/21/2023   Lab Results  Component Value Date   ALT 13 11/19/2023   AST 15 11/19/2023   ALKPHOS 49 11/19/2023   BILITOT 0.4 11/19/2023     RADIOGRAPHY: DG Lumbar Spine 1 View Result Date: 11/20/2023 CLINICAL DATA:  Right L5 laminectomy. EXAM: LUMBAR SPINE - 1 VIEW COMPARISON:  MRI of November 19, 2023. FINDINGS: Single intraoperative cross-table lateral projection was obtained of the lumbar spine. Surgical probe is directed toward posterior elements of L5. IMPRESSION: Surgical localization as described above. Electronically Signed   By: Lupita Raider M.D.   On: 11/20/2023 17:50   ECHOCARDIOGRAM COMPLETE Result Date: 11/19/2023    ECHOCARDIOGRAM REPORT   Patient Name:   Corey Rokosz Sr. Date of Exam: 11/19/2023 Medical Rec #:  [id number]                  Height:       67.0 in Accession #:    [id number]                 Weight:       129.3 lb Date of Birth:  August 01, 1956                   BSA:          1.680 m Patient Age:    67 years                   BP:            136/50 mmHg Patient Gender: M                          HR:           53 bpm. Exam Location:  Inpatient Procedure: 2D Echo, Color Doppler and Cardiac Doppler Indications:    murmur  History:        Patient has no prior history of Echocardiogram examinations.                 Risk Factors:Hypertension.  Sonographer:    Melissa Morford RDCS (AE, PE) Referring Phys: [id number] ANAND D HONGALGI IMPRESSIONS  1. Left ventricular ejection fraction, by estimation, is 60 to 65%. The left ventricle has normal function. The left ventricle has no regional wall motion abnormalities. There is moderate concentric left ventricular hypertrophy. Left ventricular diastolic parameters are consistent with Grade II diastolic dysfunction (pseudonormalization).  2. Right ventricular systolic function is normal.  The right ventricular size is normal. There is normal pulmonary artery systolic pressure.  3. Left atrial size was mildly dilated.  4. Mild mitral valve regurgitation.  5. Unable to determine if this is a bicupsid valve. AI directed towards the anterior leaflet. The aortic valve is calcified. Aortic valve regurgitation is moderate. Severe aortic valve stenosis. Aortic valve area, by VTI measures 0.85 cm. Aortic valve mean gradient measures 41.0 mmHg.  6. The inferior vena cava is normal in size with greater than 50% respiratory variability, suggesting right atrial pressure of 3 mmHg. Conclusion(s)/Recommendation(s): Recommend cardiac CT for severe AS. FINDINGS  Left Ventricle: Left ventricular ejection fraction, by estimation, is 60 to 65%. The left ventricle has normal function. The left ventricle has no regional wall motion abnormalities. The left ventricular internal cavity size was normal in size. There is  moderate concentric left ventricular hypertrophy. Left ventricular diastolic parameters are consistent with Grade II diastolic dysfunction (pseudonormalization). Right Ventricle: The right ventricular size is normal. Right  ventricular systolic function is normal. There is normal pulmonary artery systolic pressure. The tricuspid regurgitant velocity is 2.32 m/s, and with an assumed right atrial pressure of 3 mmHg,  the estimated right ventricular systolic pressure is 24.5 mmHg. Left Atrium: Left atrial size was mildly dilated. Right Atrium: Right atrial size was normal in size. Pericardium: There is no evidence of pericardial effusion. Mitral Valve: Mild mitral annular calcification. Mild mitral valve regurgitation. Tricuspid Valve: Tricuspid valve regurgitation is mild. Aortic Valve: Unable to determine if this is a bicupsid valve. AI directed towards the anterior leaflet. The aortic valve is calcified. Aortic valve regurgitation is moderate. Severe aortic stenosis is present. Aortic valve mean gradient measures 41.0 mmHg. Aortic valve peak gradient measures 17.3 mmHg. Aortic valve area, by VTI measures 0.85 cm. Pulmonic Valve: Pulmonic valve regurgitation is trivial. Aorta: The aortic root and ascending aorta are structurally normal, with no evidence of dilitation. Venous: The inferior vena cava is normal in size with greater than 50% respiratory variability, suggesting right atrial pressure of 3 mmHg. IAS/Shunts: No atrial level shunt detected by color flow Doppler.  LEFT VENTRICLE PLAX 2D LVIDd:         4.90 cm      Diastology LVIDs:         3.10 cm      LV e' medial:    6.31 cm/s LV PW:         1.20 cm      LV E/e' medial:  15.4 LV IVS:        1.20 cm      LV e' lateral:   7.07 cm/s LVOT diam:     2.00 cm      LV E/e' lateral: 13.7 LV SV:         89 LV SV Index:   53 LVOT Area:     3.14 cm  LV Volumes (MOD) LV vol d, MOD A2C: 112.0 ml LV vol d, MOD A4C: 123.0 ml LV vol s, MOD A2C: 51.3 ml LV vol s, MOD A4C: 58.6 ml LV SV MOD A2C:     60.7 ml LV SV MOD A4C:     123.0 ml LV SV MOD BP:      62.5 ml RIGHT VENTRICLE RV S prime:     12.40 cm/s TAPSE (M-mode): 2.6 cm LEFT ATRIUM           Index        RIGHT ATRIUM  Index LA  diam:      3.60 cm 2.14 cm/m   RA Area:     15.00 cm LA Vol (A4C): 59.3 ml 35.30 ml/m  RA Volume:   41.90 ml  24.94 ml/m  AORTIC VALVE AV Area (Vmax):    1.92 cm AV Area (Vmean):   0.86 cm AV Area (VTI):     0.85 cm AV Vmax:           207.94 cm/s AV Vmean:          300.000 cm/s AV VTI:            1.050 m AV Peak Grad:      17.3 mmHg AV Mean Grad:      41.0 mmHg LVOT Vmax:         127.00 cm/s LVOT Vmean:        81.900 cm/s LVOT VTI:          0.284 m LVOT/AV VTI ratio: 0.27  AORTA Ao Root diam: 3.60 cm Ao Asc diam:  3.40 cm MITRAL VALVE               TRICUSPID VALVE MV Area (PHT): 3.06 cm    TR Peak grad:   21.5 mmHg MV Decel Time: 248 msec    TR Vmax:        232.00 cm/s MV E velocity: 96.90 cm/s MV A velocity: 82.60 cm/s  SHUNTS MV E/A ratio:  1.17        Systemic VTI:  0.28 m                            Systemic Diam: 2.00 cm Carolan Clines Electronically signed by Carolan Clines Signature Date/Time: 11/19/2023/1:17:02 PM    Final    MR Lumbar Spine W Wo Contrast Result Date: 11/19/2023 CLINICAL DATA:  Initial evaluation for compression fracture, known malignancy. EXAM: MRI LUMBAR SPINE WITHOUT AND WITH CONTRAST TECHNIQUE: Multiplanar and multiecho pulse sequences of the lumbar spine were obtained without and with intravenous contrast. CONTRAST:  6mL GADAVIST GADOBUTROL 1 MMOL/ML IV SOLN COMPARISON:  Prior CT from 10/28/2023 and MRI from 09/24/2023. FINDINGS: Segmentation: Standard. Lowest well-formed disc space labeled the L5-S1 level. Alignment: Trace dextroscoliosis. Trace degenerative anterolisthesis of L2 on L3. Vertebrae: Previously identified metastatic lesion involving the posterior aspect of the L5 vertebral body again seen, not significantly changed in size or appearance since previous. Associated pathologic fracture with osseous fragment posterior to the L5 vertebral body, better appreciated on recent CT. Epidural extension with tumor extending into the ventral epidural space, little interval changed. No  new metastatic lesions within the lumbar spine. Vertebral body height otherwise maintained. Bone marrow signal intensity otherwise normal. No evidence for osteomyelitis discitis or septic arthritis. Conus medullaris and cauda equina: Conus extends to the L1 level. Conus and cauda equina appear normal. Paraspinal and other soft tissues: Paraspinous soft tissues demonstrate no acute finding. Multiple scattered T2 hyperintense cyst noted about the visualized kidneys, benign in appearance, no follow-up imaging recommended. Disc levels: L1-2:  Unremarkable. L2-3: Trace anterolisthesis. Disc desiccation with mild disc bulge and reactive endplate spurring. Mild right greater than left facet hypertrophy with small joint effusions. No significant spinal stenosis. Mild bilateral L2 foraminal narrowing. L3-4: Disc desiccation with minimal disc bulge. Superimposed small left foraminal disc protrusion closely approximates the exiting left L3 nerve root (series 8, image 22). Mild facet spurring. No spinal stenosis. Mild bilateral L3 foraminal stenosis. L4-5:  Disc desiccation with mild disc bulge. Mild facet and ligament flavum hypertrophy with trace joint effusions. No significant spinal stenosis. Mild bilateral L4 foraminal narrowing. L5-S1: Metastatic lesion with epidural extension of tumor into the ventral epidural space. Superimposed mild bilateral facet hypertrophy. Resultant moderate canal with moderate to severe bilateral subarticular stenosis. Mild left with moderate right L5 foraminal narrowing. Overall appearance is similar to previous MRI. IMPRESSION: 1. No significant interval change in size or appearance of known metastatic lesion involving the posterior aspect of the L5 vertebral body. Associated pathologic fracture fragment better appreciated on recent CT. Epidural extension of tumor into the ventral epidural space with resultant moderate canal with moderate to severe bilateral subarticular stenosis. 2. No new  metastatic disease within the lumbar spine. 3. Mild spondylosis elsewhere within the lumbar spine as above. No other significant spinal stenosis. Mild bilateral L2 through L4 foraminal narrowing, with mild left and moderate right L5 foraminal stenosis. Electronically Signed   By: Rise Mu M.D.   On: 11/19/2023 05:15      IMPRESSION/PLAN: 1. 68 y.o. 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   Today, we talked to the patient and family about the findings and workup thus far. We discussed the natural history of metastatic hepatocellular carcinoma and general treatment, highlighting the role of radiotherapy in the management. We discussed the available radiation techniques, and focused on the details and logistics of delivery. The recommendation is for a 2 week course of daily ultra hypofractionated radiotherapy (UHRT) to the surgical site at L5-S1. We reviewed the anticipated acute and late sequelae associated with radiation in this setting, particularly since he has had prior stereotactic radiotherapy to L5. The patient was encouraged to ask questions that were answered to his stated satisfaction.  At the conclusion of our conversation, the patient is in agreement to proceed with the recommended 2 week course of daily ultra hypofractionated radiotherapy (UHRT) to the surgical site at L5-S1.  He has freely signed written consent to proceed today in the office and a copy of this document will be placed in his medical record.  He is scheduled for CT simulation/treatment planning following our visit today, in anticipation of beginning his daily treatments on 12/10/2023.  We will share our discussion with Dr. Mosetta Putt and Dr. Danielle Dess and look forward to continuing to participate in his care.  We personally spent 60 minutes in this encounter including chart review, reviewing radiological studies, meeting face-to-face with the patient,  entering orders and completing documentation.    Marguarite Arbour, PA-C    Margaretmary Dys, MD  [facility]  Radiation Oncology Direct Dial: [phone]  Fax: [phone] [email or web address]  Skype  [email or web address]   This document serves as a record of services personally performed by Margaretmary Dys, MD and Marcello Fennel, PA-C. It was created on their behalf by Mickie Bail, a trained medical scribe. The creation of this record is based on the scribe's personal observations and the provider's statements to them. This document has been checked and approved by the attending provider.

## 2023-12-03 ENCOUNTER — Other Ambulatory Visit: Payer: Self-pay

## 2023-12-03 ENCOUNTER — Ambulatory Visit: Payer: Commercial Managed Care - PPO | Attending: Neurological Surgery

## 2023-12-03 DIAGNOSIS — R262 Difficulty in walking, not elsewhere classified: Secondary | ICD-10-CM | POA: Diagnosis present

## 2023-12-03 DIAGNOSIS — M79661 Pain in right lower leg: Secondary | ICD-10-CM | POA: Insufficient documentation

## 2023-12-03 DIAGNOSIS — R2689 Other abnormalities of gait and mobility: Secondary | ICD-10-CM | POA: Diagnosis present

## 2023-12-03 DIAGNOSIS — M5459 Other low back pain: Secondary | ICD-10-CM | POA: Insufficient documentation

## 2023-12-03 DIAGNOSIS — M6281 Muscle weakness (generalized): Secondary | ICD-10-CM | POA: Diagnosis present

## 2023-12-03 NOTE — Therapy (Signed)
OUTPATIENT PHYSICAL THERAPY THORACOLUMBAR EVALUATION   Patient Name: Corey Spahr Sr. MRN: [id number] DOB:September 28, 1956, 68 y.o., male Today's Date: 12/05/2023  END OF SESSION:  PT End of Session - 12/05/23 0754     Visit Number 1    Number of Visits 20    Date for PT Re-Evaluation 02/13/24    Authorization Type [employer]    PT Start Time 1140    PT Stop Time 1230    PT Time Calculation (min) 50 min    Activity Tolerance Patient tolerated treatment well    Behavior During Therapy WFL for tasks assessed/performed             Past Medical History:  Diagnosis Date   Headache    migraines   Hepatitis    HX OF hEP c ?    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   Past Surgical History:  Procedure Laterality Date   DECOMPRESSIVE LUMBAR LAMINECTOMY LEVEL 1 N/A 11/20/2023   Procedure: lumbar laminectomy right Lumbar five with decompression of nerve roots and excision of tumor;  Surgeon: [name], MD;  Location: [facility] OR;  Service: Neurosurgery;  Laterality: N/A;   IR RADIOLOGIST EVAL & MGMT  01/25/2020   IR RADIOLOGIST EVAL & MGMT  03/21/2020   IR RADIOLOGIST EVAL & MGMT  06/07/2020   IR RADIOLOGIST EVAL & MGMT  09/05/2020   IR RADIOLOGIST EVAL & MGMT  12/05/2020   IR RADIOLOGIST EVAL & MGMT  05/09/2021   IR RADIOLOGIST EVAL & MGMT  09/06/2021   IR RADIOLOGIST EVAL & MGMT  12/12/2021   IR RADIOLOGIST EVAL & MGMT  06/25/2022   IR RADIOLOGIST EVAL & MGMT  07/24/2022   RADIOLOGY WITH ANESTHESIA N/A 03/01/2020   Procedure: CT WITH ANESTHESIA  THERMAL MICROWAVE  ABLATIION;  Surgeon: [name], MD;  Location: [facility] ORS;  Service: Anesthesiology;  Laterality: N/A;   RADIOLOGY WITH ANESTHESIA N/A 07/17/2022   Procedure: CT MICROWAVE ABLATION;  Surgeon: [name], MD;  Location: [facility] ORS;  Service: Radiology;  Laterality: N/A;   Patient Active Problem List   Diagnosis Date Noted   Severe aortic stenosis 11/19/2023   Chronic pain syndrome  11/18/2023   Chronic nausea 11/18/2023   Vertebral compression fracture (HCC) 11/18/2023   Acute kidney injury superimposed on chronic kidney disease (HCC) 11/18/2023   Anemia of chronic disease 11/18/2023   Preoperative evaluation to rule out surgical contraindication 11/07/2023   Low back pain radiating to both legs 06/27/2023   Metastatic cancer to bone (HCC) 06/12/2023   Essential hypertension 09/12/2022   Heart murmur 09/12/2022   Preventative health care 08/02/2022   Hepatocellular carcinoma in adult [facility]) 03/01/2020    PCP: [name], MD  REFERRING PROVIDER: [name], MD  REFERRING DIAG: [phone] (ICD-10-CM) - 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 Danielle Dess on 11/20/2023. He notes that he has been doing pretty well post surgery and has resumed walking with his SPC. Has lower back pain and continued R LE pain/paresthesias but feels like he is getting some return of R ankle DF. Asked about getting an AFO for his R LE. Feels like he can only stand about 15 minutes at present due to increased LBP.   PERTINENT HISTORY:  Current Cancer, lumbar laminectomy, HTN  PAIN:  Are you having pain?  Yes: NPRS scale: 6/10 Worst: 9/10 Pain location: lower back, distal lateral R LE Pain description: sharp, post surgical, burning/numb  Aggravating factors: prolonged standing, bending Relieving  factors: positioning, rest, medications  PRECAUTIONS: Back and current cancer  RED FLAGS: None   WEIGHT BEARING RESTRICTIONS: No  FALLS:  Has patient fallen in last 6 months? No  LIVING ENVIRONMENT: Lives with: lives with their spouse Lives in: House/apartment Stairs: No - Ramp access Has following equipment at home: Single point cane, Environmental consultant - 2 wheeled, Wheelchair (manual), and reacher  OCCUPATION: Set designer  PLOF: Independent  PATIENT GOALS: decrease his back pain, improve his standing tolerance, wants to get back to work  NEXT MD VISIT: 12/05/2023  OBJECTIVE:  Note: Objective measures were completed at Evaluation unless otherwise noted.  DIAGNOSTIC FINDINGS:  See imaging   PATIENT SURVEYS:  FOTO: 51% function; 63% predicted  COGNITION: Overall cognitive status: Within functional limits for tasks assessed     SENSATION: Light touch: Impaired Right L2-4; absent L5  POSTURE: rounded shoulders, forward head, and increased lumbar lordosis  PALPATION: TTP to R calf  LUMBAR ROM: DNT due to spinal precautions  AROM eval  Flexion   Extension   Right lateral flexion   Left lateral flexion   Right rotation   Left rotation    (Blank rows = not tested)  LOWER EXTREMITY ROM:     Passive  Right eval Left eval  Hip flexion    Hip extension    Hip abduction    Hip adduction    Hip internal rotation    Hip external rotation    Knee flexion    Knee extension    Ankle dorsiflexion 0 10  Ankle plantarflexion    Ankle inversion    Ankle eversion     (Blank rows = not tested)  LOWER EXTREMITY MYOTOMES:  MYOTOME LEVEL Right Left  L2 4/5 5/5  L3 4/5 5/5  L4 1/5 5/5  L5 1/5 5/5  S1 3/5 5/5    LUMBAR SPECIAL TESTS:  DNT  FUNCTIONAL TESTS:  30 Second Sit to Stand: 7 reps  GAIT: Distance walked: 2ft Assistive device utilized: Single point cane Level of assistance: Modified independence Comments: decreased heel strike R, decreased  gait speed; improved gait with clinic donned AFO on R foot  TREATMENT: [facility] Adult PT Treatment:                                                DATE: 12/03/2023 Therapeutic Exercise: Supine R ankle DF x 5 Seated calf stretch x 30" R Supine SAQ x 10 R SLR x 10 R Bridge x 10 Sit to stand x 5  PATIENT EDUCATION:  Education details: eval findings, FOTO, HEP, POC Person educated: Patient Education  method: Explanation, Demonstration, and Handouts Education comprehension: verbalized understanding and returned demonstration  HOME EXERCISE PROGRAM: Access Code: [email or web address] URL: [email or web address] Date: 12/03/2023 Prepared by: Edwinna Areola  Exercises - Supine Ankle Pumps  - 8 x daily - 7 x weekly - 20 reps - Seated Calf Stretch with Strap  - 2-3 x daily - 7 x weekly - 3 sets - 2-3 reps - 30 sec hold - Supine Knee Extension Strengthening  - 1-2 x daily - 7 x weekly - 3 sets - 10 reps - Active Straight Leg Raise with Quad Set  - 1-2 x daily - 7 x weekly - 3 sets - 10 reps - Supine Bridge  - 1-2 x daily - 7 x weekly - 3 sets - 10 reps - Sit to Stand  - 1-2 x daily - 7 x weekly - 2 sets - 10 reps  ASSESSMENT:  CLINICAL IMPRESSION: Patient is a 68 y.o. M who was seen today for physical therapy evaluation and treatment for s/p L5 lumbar laminectomy and excision of tumor performed by Dr. Danielle Dess on 11/20/2023. Physical findings are consistent with surgery and recovery timeline, pt demonstrates decrease in gait and functional mobility as well as trace contraction of distal R LE. FOTO score shows he is operating well below PLOF for subjective functional ability. Of note, progress may be limited by metastatic cancer diagnosis, current cancer treatment, and need for another possible surgery. PT can assist with functional gait and mobility deficits and hopefully aid in decreasing LBP.   OBJECTIVE IMPAIRMENTS: Abnormal gait, decreased activity tolerance, decreased balance, decreased mobility,  difficulty walking, decreased ROM, decreased strength, impaired sensation, improper body mechanics, postural dysfunction, and pain  ACTIVITY LIMITATIONS: carrying, lifting, sitting, standing, squatting, stairs, transfers, and locomotion level  PARTICIPATION LIMITATIONS: meal prep, cleaning, driving, shopping, community activity, occupation, and yard work  PERSONAL FACTORS: Fitness, Time since onset of injury/illness/exacerbation, and 3+ comorbidities: Current Cancer, lumbar laminectomy, HTN  are also affecting patient's functional outcome.   REHAB POTENTIAL: Fair - depending on course of cancer progression/treatment  CLINICAL DECISION MAKING: Unstable/unpredictable  EVALUATION COMPLEXITY: High   GOALS: Goals reviewed with patient? No  SHORT TERM GOALS: Target date: 12/24/2023   Pt will be compliant and knowledgeable with initial HEP for improved comfort and carryover Baseline: initial HEP given  Goal status: INITIAL  2.  Pt will self report low back and R LE pain no greater than 6/10 for improved comfort and functional ability Baseline: 9/10 at worst Goal status: INITIAL   LONG TERM GOALS: Target date: 02/13/2024    Pt will improve FOTO function score to no less than 63% as proxy for functional improvement Baseline: 51% function Goal status: INITIAL   2.  Pt will self report lower back and R LE pain no greater than 3/10 for improved comfort and functional ability Baseline: 9/10 at worst Goal status: INITIAL   3.  Pt will increase 30 Second Sit to Stand rep count to no less than 9 reps for improved balance, strength, and functional mobility Baseline: 7 reps  Goal status: INITIAL   4.  Pt will be able to ambulate 1043ft with LRAD and use of AFO on R LE for improved safety and decreased fall risk Baseline: unable Goal status: INITIAL  5.  Pt will improve standing activity tolerance to at least 60 minutes in order to perform work and home duties/ADLs with improved comfort and  function Baseline: 15 minutes Goal status: INITIAL   PLAN:  PT FREQUENCY: 1-2x/week  PT DURATION: 10 weeks  PLANNED INTERVENTIONS: 97164- PT Re-evaluation, 97110-Therapeutic exercises, 97530- Therapeutic activity, [id number]- Neuromuscular re-education, 97535- Self Care, 16109- Manual therapy, and 97116- Gait training  PLAN FOR NEXT SESSION: assess HEP response, gait training, core and balance   Eloy End, PT 12/05/2023, 7:55 AM

## 2023-12-05 ENCOUNTER — Ambulatory Visit
Admission: RE | Admit: 2023-12-05 | Discharge: 2023-12-05 | Disposition: A | Discharge status: Home / Self Care | Payer: Commercial Managed Care - PPO | Source: Ambulatory Visit | Attending: Radiation Oncology | Admitting: Radiation Oncology

## 2023-12-05 ENCOUNTER — Encounter: Payer: Self-pay | Admitting: Urology

## 2023-12-05 ENCOUNTER — Ambulatory Visit
Admission: RE | Admit: 2023-12-05 | Discharge: 2023-12-05 | Disposition: A | Discharge status: Home / Self Care | Payer: Commercial Managed Care - PPO | Source: Ambulatory Visit | Attending: Urology | Admitting: Urology

## 2023-12-05 VITALS — BP 158/63 | HR 84 | Temp 97.6°F | Resp 20 | Ht 67.0 in | Wt 129.0 lb

## 2023-12-05 DIAGNOSIS — C220 Liver cell carcinoma: Secondary | ICD-10-CM | POA: Diagnosis not present

## 2023-12-05 DIAGNOSIS — C7951 Secondary malignant neoplasm of bone: Secondary | ICD-10-CM

## 2023-12-05 DIAGNOSIS — Z510 Encounter for antineoplastic radiation therapy: Secondary | ICD-10-CM | POA: Insufficient documentation

## 2023-12-05 NOTE — Progress Notes (Signed)
Nursing interview for Metastatic cancer to bone [facility])   Patient identity verified x2.  Patient reports dorsalgia 6/10 located at L5, difficulty sitting for a while, and stiffness. Patient denies any other related issues at this time.  Meaningful use complete.  Vitals- BP (!) 158/63 (BP Location: Left Arm, Patient Position: Sitting, Cuff Size: Normal)   Pulse 84   Temp 97.6 F (36.4 C) (Temporal)   Resp 20   Ht 5\' 7"  (1.702 m)   Wt 129 lb (58.5 kg)   SpO2 100%   BMI 20.20 kg/m   This concludes the interaction.  Ruel Favors, LPN

## 2023-12-06 NOTE — Progress Notes (Signed)
  Radiation Oncology         [phone]) [phone] ________________________________  Name: Corey Der Sr. MRN: [id number]  Date: 12/05/2023  DOB: 26-Apr-1956  STEREOTACTIC BODY RADIOTHERAPY SIMULATION AND TREATMENT PLANNING NOTE    ICD-10-CM   1. 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   NARRATIVE:  The patient was brought to the CT Simulation planning suite.  Identity was confirmed.  All relevant records and images related to the planned course of therapy were reviewed.  The patient freely provided informed written consent to proceed with treatment after reviewing the details related to the planned course of therapy. The consent form was witnessed and verified by the simulation staff.  Then, the patient was set-up in a stable reproducible  supine position for radiation therapy.  A BodyFix immobilization pillow was fabricated for reproducible positioning.  Surface markings were placed.  The CT images were loaded into the planning software.  The gross target volumes (GTV) and planning target volumes (PTV) were delinieated, and avoidance structures were contoured.  Treatment planning then occurred.  The radiation prescription was entered and confirmed.  A total of two complex treatment devices were fabricated in the form of the BodyFix immobilization pillow and a neck accuform cushion.  I have requested : 3D Simulation  I have requested a DVH of the following structures: targets and all normal structures near the target including bowel, bladder, femoral heads, skin and others as noted on the radiation plan to maintain doses in adherence with established limits  SPECIAL TREATMENT PROCEDURE:  The planned course of therapy using radiation constitutes a special treatment procedure. Special care is required in the management of this patient for the following reasons.  High dose per fraction requiring special monitoring for increased toxicities of treatment including daily imaging..  The special nature of the planned course of radiotherapy will require increased physician supervision and oversight to ensure patient's safety with optimal treatment outcomes.    This requires extended time and effort.    PLAN:  The patient will receive 50 Gy in 10 fractions.  ________________________________  Artist Pais Kathrynn Running, M.D.

## 2023-12-08 ENCOUNTER — Emergency Department (HOSPITAL_COMMUNITY): Payer: Commercial Managed Care - PPO

## 2023-12-08 ENCOUNTER — Observation Stay (HOSPITAL_COMMUNITY)
Admission: EM | Admit: 2023-12-08 | Discharge: 2023-12-09 | Disposition: A | Discharge status: Home / Self Care | Payer: Commercial Managed Care - PPO | Attending: Internal Medicine | Admitting: Internal Medicine

## 2023-12-08 ENCOUNTER — Observation Stay (HOSPITAL_COMMUNITY): Payer: Commercial Managed Care - PPO

## 2023-12-08 ENCOUNTER — Other Ambulatory Visit: Payer: Self-pay

## 2023-12-08 DIAGNOSIS — R42 Dizziness and giddiness: Secondary | ICD-10-CM

## 2023-12-08 DIAGNOSIS — Z87891 Personal history of nicotine dependence: Secondary | ICD-10-CM | POA: Insufficient documentation

## 2023-12-08 DIAGNOSIS — Z8505 Personal history of malignant neoplasm of liver: Secondary | ICD-10-CM | POA: Insufficient documentation

## 2023-12-08 DIAGNOSIS — Z7982 Long term (current) use of aspirin: Secondary | ICD-10-CM | POA: Diagnosis not present

## 2023-12-08 DIAGNOSIS — I10 Essential (primary) hypertension: Secondary | ICD-10-CM | POA: Diagnosis not present

## 2023-12-08 DIAGNOSIS — C220 Liver cell carcinoma: Secondary | ICD-10-CM | POA: Diagnosis present

## 2023-12-08 DIAGNOSIS — G459 Transient cerebral ischemic attack, unspecified: Secondary | ICD-10-CM | POA: Diagnosis not present

## 2023-12-08 LAB — COMPREHENSIVE METABOLIC PANEL
ALT: 16 U/L (ref 0–44)
AST: 19 U/L (ref 15–41)
Albumin: 4 g/dL (ref 3.5–5.0)
Alkaline Phosphatase: 61 U/L (ref 38–126)
Anion gap: 11 (ref 5–15)
BUN: 28 mg/dL — ABNORMAL HIGH (ref 8–23)
CO2: 23 mmol/L (ref 22–32)
Calcium: 10.1 mg/dL (ref 8.9–10.3)
Chloride: 106 mmol/L (ref 98–111)
Creatinine, Ser: 1.41 mg/dL — ABNORMAL HIGH (ref 0.61–1.24)
GFR, Estimated: 55 mL/min — ABNORMAL LOW (ref >60)
Glucose, Bld: 124 mg/dL — ABNORMAL HIGH (ref 70–99)
Potassium: 4.3 mmol/L (ref 3.5–5.1)
Sodium: 140 mmol/L (ref 135–145)
Total Bilirubin: 0.6 mg/dL (ref 0.0–1.2)
Total Protein: 7.6 g/dL (ref 6.5–8.1)

## 2023-12-08 LAB — CBC
HCT: 32.3 % — ABNORMAL LOW (ref 39.0–52.0)
HCT: 29.9 % — ABNORMAL LOW (ref 39.0–52.0)
Hemoglobin: 10.6 g/dL — ABNORMAL LOW (ref 13.0–17.0)
Hemoglobin: 9.8 g/dL — ABNORMAL LOW (ref 13.0–17.0)
MCH: 32.7 pg (ref 26.0–34.0)
MCH: 32.3 pg (ref 26.0–34.0)
MCHC: 32.8 g/dL (ref 30.0–36.0)
MCHC: 32.8 g/dL (ref 30.0–36.0)
MCV: 99.7 fL (ref 80.0–100.0)
MCV: 98.7 fL (ref 80.0–100.0)
Platelets: 227 10*3/uL (ref 150–400)
Platelets: 212 10*3/uL (ref 150–400)
RBC: 3.24 MIL/uL — ABNORMAL LOW (ref 4.22–5.81)
RBC: 3.03 MIL/uL — ABNORMAL LOW (ref 4.22–5.81)
RDW: 13.3 % (ref 11.5–15.5)
RDW: 13.4 % (ref 11.5–15.5)
WBC: 8.9 10*3/uL (ref 4.0–10.5)
WBC: 5.3 10*3/uL (ref 4.0–10.5)
nRBC: 0 % (ref 0.0–0.2)
nRBC: 0 % (ref 0.0–0.2)

## 2023-12-08 LAB — DIFFERENTIAL
Abs Immature Granulocytes: 0.03 10*3/uL (ref 0.00–0.07)
Basophils Absolute: 0 10*3/uL (ref 0.0–0.1)
Basophils Relative: 0 %
Eosinophils Absolute: 0.1 10*3/uL (ref 0.0–0.5)
Eosinophils Relative: 1 %
Immature Granulocytes: 0 %
Lymphocytes Relative: 11 %
Lymphs Abs: 0.9 10*3/uL (ref 0.7–4.0)
Monocytes Absolute: 0.7 10*3/uL (ref 0.1–1.0)
Monocytes Relative: 7 %
Neutro Abs: 7.2 10*3/uL (ref 1.7–7.7)
Neutrophils Relative %: 81 %

## 2023-12-08 LAB — PROTIME-INR
INR: 1.1 (ref 0.8–1.2)
Prothrombin Time: 14.3 s (ref 11.4–15.2)

## 2023-12-08 LAB — CBG MONITORING, ED: Glucose-Capillary: 118 mg/dL — ABNORMAL HIGH (ref 70–99)

## 2023-12-08 LAB — HEMOGLOBIN A1C
Hgb A1c MFr Bld: 5.4 % (ref 4.8–5.6)
Mean Plasma Glucose: 108.28 mg/dL

## 2023-12-08 LAB — TROPONIN I (HIGH SENSITIVITY): Troponin I (High Sensitivity): 19 ng/L — ABNORMAL HIGH (ref <18)

## 2023-12-08 LAB — I-STAT CHEM 8, ED
BUN: 32 mg/dL — ABNORMAL HIGH (ref 8–23)
Calcium, Ion: 1.23 mmol/L (ref 1.15–1.40)
Chloride: 108 mmol/L (ref 98–111)
Creatinine, Ser: 1.4 mg/dL — ABNORMAL HIGH (ref 0.61–1.24)
Glucose, Bld: 119 mg/dL — ABNORMAL HIGH (ref 70–99)
HCT: 34 % — ABNORMAL LOW (ref 39.0–52.0)
Hemoglobin: 11.6 g/dL — ABNORMAL LOW (ref 13.0–17.0)
Potassium: 4.3 mmol/L (ref 3.5–5.1)
Sodium: 142 mmol/L (ref 135–145)
TCO2: 24 mmol/L (ref 22–32)

## 2023-12-08 LAB — CREATININE, SERUM
Creatinine, Ser: 1.24 mg/dL (ref 0.61–1.24)
GFR, Estimated: >60 mL/min (ref >60)

## 2023-12-08 LAB — ETHANOL: Alcohol, Ethyl (B): <10 mg/dL (ref <10)

## 2023-12-08 LAB — APTT: aPTT: 27 s (ref 24–36)

## 2023-12-08 MED ORDER — ACETAMINOPHEN 325 MG PO TABS: 650.0000 mg | ORAL_TABLET | ORAL | Status: DC | PRN

## 2023-12-08 MED ORDER — ENOXAPARIN SODIUM 40 MG/0.4ML IJ SOSY
40.0000 mg | PREFILLED_SYRINGE | INTRAMUSCULAR | Status: DC
Start: 2023-12-09 — End: 2023-12-09

## 2023-12-08 MED ORDER — ACETAMINOPHEN 650 MG RE SUPP: 650.0000 mg | RECTAL | Status: DC | PRN

## 2023-12-08 MED ORDER — CLOPIDOGREL BISULFATE 75 MG PO TABS
75.0000 mg | ORAL_TABLET | Freq: Every day | ORAL | Status: DC
  Administered 2023-12-08 – 2023-12-09 (×2): 75 mg via ORAL
  Filled 2023-12-08 (×2): qty 1

## 2023-12-08 MED ORDER — STROKE: EARLY STAGES OF RECOVERY BOOK
Freq: Once | Status: AC
  Filled 2023-12-08: qty 1

## 2023-12-08 MED ORDER — SODIUM CHLORIDE 0.9% FLUSH
3.0000 mL | Freq: Once | INTRAVENOUS | Status: AC
  Administered 2023-12-08: 3 mL via INTRAVENOUS

## 2023-12-08 MED ORDER — ASPIRIN 81 MG PO TBEC
81.0000 mg | DELAYED_RELEASE_TABLET | Freq: Every day | ORAL | Status: DC
  Administered 2023-12-08 – 2023-12-09 (×2): 81 mg via ORAL
  Filled 2023-12-08 (×2): qty 1

## 2023-12-08 MED ORDER — SENNOSIDES-DOCUSATE SODIUM 8.6-50 MG PO TABS: 1.0000 | ORAL_TABLET | Freq: Every evening | ORAL | Status: DC | PRN

## 2023-12-08 MED ORDER — CARBAMIDE PEROXIDE 6.5 % OT SOLN
5.0000 [drp] | Freq: Two times a day (BID) | OTIC | Status: DC
  Administered 2023-12-08 – 2023-12-09 (×2): 5 [drp] via OTIC
  Filled 2023-12-08: qty 15

## 2023-12-08 MED ORDER — ACETAMINOPHEN 160 MG/5ML PO SOLN: 650.0000 mg | ORAL | Status: DC | PRN

## 2023-12-08 NOTE — H&P (Signed)
History and Physical    Patient: Corey Armstrong ZOX:[id number] DOB: 05-02-1956 DOA: 12/08/2023 DOS: the patient was seen and examined on 12/08/2023 PCP: [name], MD  Patient coming from: Home  Chief Complaint:  Chief Complaint  Patient presents with   Dysarthria   Code Stroke   HPI: Corey Atienza Sr. is a 68 y.o. male with medical history significant of metastatic hepatocellular Ca. However, patient has genrealy pretty good functional status.  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  Further metastatic disease to lumbar spine, it is complicated by right foot drop.  Patient recently had surgery done on January 9 procedure : : Bilateral laminotomies L5-S1 with decompression of the spinal canal and the L5 nerve roots epidural tumor.   Patient actually has been taking no medications including no pain medication since then.  Although he/slight discomfort in lower back.  Patient was in his usual state of health till approximately 11 AM this morning when he reports an abrupt onset of difficulty speaking as well as sensation of vertigo/dizziness.  He denies any hearing loss, ear discomfort or discharge.  The sensation only lasted for 30 minutes.  Patient actually got to [facility], ER at approximately 2 PM and by then his patient's symptoms had almost completely resolved.  Since then patient is completely asymptomatic except for the chronic right foot drop.  Patient denies any fever headache nausea vomiting vision changes trouble swallowing bladder or bowel control changes.  Workup in the ER as noted below, CAT scan is nonactionable of the head.  MRI brain is pending   Review of Systems: As mentioned in the history of present illness. All other systems reviewed and are  negative. Past Medical History:  Diagnosis Date   Headache    migraines   Hepatitis    HX OF hEP c ?    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   Past Surgical History:  Procedure Laterality Date   DECOMPRESSIVE LUMBAR LAMINECTOMY LEVEL 1 N/A 11/20/2023   Procedure: lumbar laminectomy right Lumbar five with decompression of nerve roots and excision of tumor;  Surgeon: [name], MD;  Location[facility] OR;  Service: Neurosurgery;  Laterality: N/A;   IR RADIOLOGIST EVAL & MGMT  01/25/2020   IR RADIOLOGIST EVAL & MGMT  03/21/2020   IR RADIOLOGIST EVAL & MGMT  06/07/2020   IR RADIOLOGIST EVAL & MGMT  09/05/2020   IR RADIOLOGIST EVAL & MGMT  12/05/2020   IR RADIOLOGIST EVAL & MGMT  05/09/2021   IR RADIOLOGIST EVAL & MGMT  09/06/2021   IR RADIOLOGIST EVAL & MGMT  12/12/2021   IR RADIOLOGIST EVAL & MGMT  06/25/2022   IR RADIOLOGIST EVAL & MGMT  07/24/2022   RADIOLOGY WITH ANESTHESIA N/A 03/01/2020   Procedure: CT WITH ANESTHESIA  THERMAL MICROWAVE  ABLATIION;  Surgeon: [name], MD;  Location: [facility] ORS;  Service: Anesthesiology;  Laterality: N/A;   RADIOLOGY WITH ANESTHESIA N/A 07/17/2022   Procedure: CT MICROWAVE ABLATION;  Surgeon: [name], MD;  Location: [facility] ORS;  Service: Radiology;  Laterality: N/A;   Social History:  reports that he has quit smoking. He has been exposed to tobacco smoke. He has never used smokeless tobacco. He reports that he does not currently use alcohol. He reports that he does not  use drugs.  No Known Allergies  Family History  Problem Relation Age of Onset   Arthritis Mother    Cancer Neg Hx    Diabetes Neg Hx    Drug abuse Neg Hx    Heart disease Neg Hx    Hyperlipidemia Neg Hx    Hypertension Neg Hx    Kidney disease Neg Hx    Stroke Neg Hx     Prior to Admission medications   Medication Sig Start Date End Date Taking? Authorizing Provider  acetaminophen (TYLENOL) 500 MG tablet Take 1,000 mg by mouth every 6  (six) hours as needed.   Yes [provider]  gabapentin (NEURONTIN) 300 MG capsule Take 2 capsules (600 mg) in the morning, 2 capsules (600mg ) midday, 3 capsules (900 mg) at bedtime. Patient not taking: Reported on 12/08/2023 10/01/23   [name], [name], NP  HYDROcodone Bitartrate ER 20 MG CP12 Take 1 capsule by mouth every 12 (twelve) hours. Patient not taking: Reported on 12/08/2023 11/13/23   [name], [name], NP  metoCLOPramide (REGLAN) 10 MG tablet Take 10 mg by mouth 4 (four) times daily. Patient not taking: Reported on 12/08/2023    [provider]  ondansetron (ZOFRAN-ODT) 4 MG disintegrating tablet Take 1 tablet (4 mg total) by mouth every 8 (eight) hours as needed for nausea or vomiting. Patient not taking: Reported on 12/08/2023 10/22/23   Malachy Mood, MD  oxyCODONE (OXY IR/ROXICODONE) 5 MG immediate release tablet Take 1-2 tablets (5-10 mg total) by mouth every 6 (six) hours as needed for severe pain (pain score 7-10). Patient not taking: Reported on 12/08/2023 10/22/23   [name], [name], NP  pantoprazole (PROTONIX) 40 MG tablet Take 1 tablet (40 mg total) by mouth daily. Patient not taking: Reported on 12/08/2023 09/24/23   [name], [name], NP  polyethylene glycol (MIRALAX / GLYCOLAX) 17 g packet Take 17 g by mouth daily. Patient not taking: Reported on 12/08/2023 11/21/23   [name], MD  prochlorperazine (COMPAZINE) 10 MG tablet Take 1 tablet (10 mg total) by mouth every 6 (six) hours as needed for nausea or vomiting. Patient not taking: Reported on 12/08/2023 10/22/23   [name], [name], NP  senna-docusate (SENOKOT-S) 8.6-50 MG tablet Take 1 tablet by mouth at bedtime as needed for mild constipation or moderate constipation. Patient not taking: Reported on 12/08/2023 11/21/23   [name], MD   Patient not taking any medications at home Physical Exam: Vitals:   12/08/23 1500 12/08/23 1515 12/08/23 1746  12/08/23 1815  BP: (!) 151/55 133/61 (!) 112/53 115/66  Pulse: 72 76 73 74  Resp: 18 17 14 17   Temp:   98.5 F (36.9 C)   TempSrc:   Oral   SpO2: 100% 100% 100% 100%  Weight:      Height:      General: Patient is alert awake oriented x 3 appears to be in no distress Respiratory exam: Bilateral intravesicular Cardiovascular exam S1-S2 normal Abdomen all quadrants are soft nontender Extremities warm without edema Neurologically patient is alert awake oriented x 3 no focal motor deficit is noted except for chronic appearing right foot drop.  Wife at the bedside. Data Reviewed:  Labs on Admission:  Results for orders placed or performed during the hospital encounter of 12/08/23 (from the past 24 hours)  CBG monitoring, ED     Status: Abnormal   Collection Time: 12/08/23  1:40 PM  Result Value Ref Range   Glucose-Capillary 118 (H) 70 -  99 mg/dL  Protime-INR     Status: None   Collection Time: 12/08/23  1:42 PM  Result Value Ref Range   Prothrombin Time 14.3 11.4 - 15.2 seconds   INR 1.1 0.8 - 1.2  APTT     Status: None   Collection Time: 12/08/23  1:42 PM  Result Value Ref Range   aPTT 27 24 - 36 seconds  CBC     Status: Abnormal   Collection Time: 12/08/23  1:42 PM  Result Value Ref Range   WBC 8.9 4.0 - 10.5 K/uL   RBC 3.24 (L) 4.22 - 5.81 MIL/uL   Hemoglobin 10.6 (L) 13.0 - 17.0 g/dL   HCT 16.1 (L) 09.6 - 04.5 %   MCV 99.7 80.0 - 100.0 fL   MCH 32.7 26.0 - 34.0 pg   MCHC 32.8 30.0 - 36.0 g/dL   RDW 40.9 81.1 - 91.4 %   Platelets 227 150 - 400 K/uL   nRBC 0.0 0.0 - 0.2 %  Differential     Status: None   Collection Time: 12/08/23  1:42 PM  Result Value Ref Range   Neutrophils Relative % 81 %   Neutro Abs 7.2 1.7 - 7.7 K/uL   Lymphocytes Relative 11 %   Lymphs Abs 0.9 0.7 - 4.0 K/uL   Monocytes Relative 7 %   Monocytes Absolute 0.7 0.1 - 1.0 K/uL   Eosinophils Relative 1 %   Eosinophils Absolute 0.1 0.0 - 0.5 K/uL   Basophils Relative 0 %   Basophils Absolute 0.0  0.0 - 0.1 K/uL   Immature Granulocytes 0 %   Abs Immature Granulocytes 0.03 0.00 - 0.07 K/uL  Comprehensive metabolic panel     Status: Abnormal   Collection Time: 12/08/23  1:42 PM  Result Value Ref Range   Sodium 140 135 - 145 mmol/L   Potassium 4.3 3.5 - 5.1 mmol/L   Chloride 106 98 - 111 mmol/L   CO2 23 22 - 32 mmol/L   Glucose, Bld 124 (H) 70 - 99 mg/dL   BUN 28 (H) 8 - 23 mg/dL   Creatinine, Ser 7.82 (H) 0.61 - 1.24 mg/dL   Calcium 95.6 8.9 - 21.3 mg/dL   Total Protein 7.6 6.5 - 8.1 g/dL   Albumin 4.0 3.5 - 5.0 g/dL   AST 19 15 - 41 U/L   ALT 16 0 - 44 U/L   Alkaline Phosphatase 61 38 - 126 U/L   Total Bilirubin 0.6 0.0 - 1.2 mg/dL   GFR, Estimated 55 (L) >60 mL/min   Anion gap 11 5 - 15  Ethanol     Status: None   Collection Time: 12/08/23  1:42 PM  Result Value Ref Range   Alcohol, Ethyl (B) <10 <10 mg/dL  I-stat chem 8, ED     Status: Abnormal   Collection Time: 12/08/23  1:46 PM  Result Value Ref Range   Sodium 142 135 - 145 mmol/L   Potassium 4.3 3.5 - 5.1 mmol/L   Chloride 108 98 - 111 mmol/L   BUN 32 (H) 8 - 23 mg/dL   Creatinine, Ser 0.86 (H) 0.61 - 1.24 mg/dL   Glucose, Bld 578 (H) 70 - 99 mg/dL   Calcium, Ion 4.69 6.29 - 1.40 mmol/L   TCO2 24 22 - 32 mmol/L   Hemoglobin 11.6 (L) 13.0 - 17.0 g/dL   HCT 52.8 (L) 41.3 - 24.4 %   Basic Metabolic Panel: Recent Labs  Lab 12/08/23 1342 12/08/23 1346  NA 140 142  K 4.3 4.3  CL 106 108  CO2 23  --   GLUCOSE 124* 119*  BUN 28* 32*  CREATININE 1.41* 1.40*  CALCIUM 10.1  --    Liver Function Tests: Recent Labs  Lab 12/08/23 1342  AST 19  ALT 16  ALKPHOS 61  BILITOT 0.6  PROT 7.6  ALBUMIN 4.0   No results for input(s): "LIPASE", "AMYLASE" in the last 168 hours. No results for input(s): "AMMONIA" in the last 168 hours. CBC: Recent Labs  Lab 12/08/23 1342 12/08/23 1346  WBC 8.9  --   NEUTROABS 7.2  --   HGB 10.6* 11.6*  HCT 32.3* 34.0*  MCV 99.7  --   PLT 227  --    Cardiac Enzymes: No  results for input(s): "CKTOTAL", "CKMB", "CKMBINDEX", "TROPONINIHS" in the last 168 hours.  BNP (last 3 results) No results for input(s): "PROBNP" in the last 8760 hours. CBG: Recent Labs  Lab 12/08/23 1340  GLUCAP 118*    Radiological Exams on Admission:  MR BRAIN WO CONTRAST Result Date: 12/08/2023 CLINICAL DATA:  Neuro deficit, acute, stroke suspected EXAM: MRI HEAD WITHOUT CONTRAST TECHNIQUE: Multiplanar, multiecho pulse sequences of the brain and surrounding structures were obtained without intravenous contrast. COMPARISON:  CT head from the same day. FINDINGS: Brain: No acute infarction, hemorrhage, hydrocephalus, extra-axial collection or mass lesion. Small remote left cerebellar infarct. Mild for age chronic microvascular ischemic change. Vascular: Major arterial flow voids are maintained at the skull base. Skull and upper cervical spine: Normal marrow signal. Sinuses/Orbits: Clear sinuses.  No acute orbital findings. Other: No mastoid effusions IMPRESSION: No evidence of acute intracranial abnormality. Electronically Signed   By: Feliberto Harts M.D.   On: 12/08/2023 17:16   CT HEAD CODE STROKE WO CONTRAST Result Date: 12/08/2023 CLINICAL DATA:  Code stroke. Neuro deficit, acute, stroke suspected. EXAM: CT HEAD WITHOUT CONTRAST TECHNIQUE: Contiguous axial images were obtained from the base of the skull through the vertex without intravenous contrast. RADIATION DOSE REDUCTION: This exam was performed according to the departmental dose-optimization program which includes automated exposure control, adjustment of the mA and/or kV according to patient size and/or use of iterative reconstruction technique. COMPARISON:  None Available. FINDINGS: Brain: No evidence of acute infarction, hemorrhage, hydrocephalus, extra-axial collection or mass lesion/mass effect. Vascular: No hyperdense vessel or unexpected calcification. Skull: Normal. Negative for fracture or focal lesion. Sinuses/Orbits: No  acute finding. Other: None. ASPECTS [facility] Stroke Program Early CT Score) - Ganglionic level infarction (caudate, lentiform nuclei, internal capsule, insula, M1-M3 cortex): 7 - Supraganglionic infarction (M4-M6 cortex): 3 Total score (0-10 with 10 being normal): 10 IMPRESSION: 1. No acute intracranial pathology. 2. ASPECTS is 10. These results communicated via AMION text age at the time of interpretation on 12/08/2023 at 2:02 pm to Dr. Selina Cooley, who verbally acknowledged these results. Electronically Signed   By: Baldemar Lenis M.D.   On: 12/08/2023 14:03      Assessment and Plan: * TIA (transient ischemic attack) Patient presents with acute onset of vertigo and reported difficulty speaking.  Symptoms have resolved since arrival to the ER.  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  Patient is s/p stroke protocol.  MRI brain is negative.  At this time we look forward to neurology input in this regard.  Follow-up echo.  Patient has been started on aspirin and Plavix for 21 days.  DVT prophylaxis with Lovenox ordered. TIA/stroke orderset  used.  Bilateral tympanic membranes were healthy to the extent visualised, however obscured by ear wax to a great extent. Will order debrox to allow better visualisation.  Hepatocellular carcinoma in adult [facility]) Based on dr. Mosetta Putt note 10/22/2023 :  diagnosed 11/2019 by MRI for f/u of cirrhosis. S/p microwave ablation on 03/01/20. -MRI 06/01/22 showed suspicion for local recurrence and metastasis at L5. Biopsy of L5 confirmed metastatic HCC. S/p repeat microwave ablation on 07/17/22. -started lenvima 08/16/22 -staging CT CAP and bone scan on 09/19/22 showed: interval enlargement of right hepatic lobe HCC; new retropulsed bony fragment at L5; no other new lesions. His previous CT was in 05/2022 and he did not start Lenvima until 07/2022, so it's hard to evaluate if he is responding to [facility] or not, the  progression is likely the nature course of his [facility]  -Restaging CT scan from January 02, 2023 showed slightly improved previously treated liver lesion, and additional area of concern in the liver which are difficult to compare with previous scan due to different timing of contrast.  No other evidence of disease progression.  Plan to obtain MRI as next scan.  I personally reviewed the CT scan images and discussed the findings with patient -Will continue Lenvima, he is tolerating well overall. -Due to his weight being above 60 kg now, I increased his Lenvima to 12 mg daily in Feb 2024 -repeated liver MRI on 04/23/2023 showed stable treated liver lesion, L5 bone mets has slightly increased in size.  Patient developed worsening low back pain in July 2024, repeated lumbar MRI showed significant disease progression in the L5 bone lesion.  I personally reviewed the scan images and discussed the findings with patient  On November 20, 2023 patient underwent spinal surgery Bilateral laminotomies L5-S1 with decompression of the spinal canal and the L5 nerve roots epidural tumor.  And is getting scheduled radiation therapy since then.  Patient has chronic right foot drop accordingly  Postural dizziness with presyncope During this ER encounter, patient reports standing up once to urinate and having recurrence of his lightheadedness/dizziness.  Therefore I am concerned patient may be having postural hypotension.  I will check postural/orthostatic blood pressures.  Check cortisol in the morning   CXR pending. Troponin pending.  Advance Care Planning:   Code Status: Full Code   Consults: neurology  Family Communication: wife at bedside. All questions answered.  Severity of Illness: The appropriate patient status for this patient is OBSERVATION. Observation status is judged to be reasonable and necessary in order to provide the required intensity of service to ensure the patient's safety. The patient's presenting  symptoms, physical exam findings, and initial radiographic and laboratory data in the context of their medical condition is felt to place them at decreased risk for further clinical deterioration. Furthermore, it is anticipated that the patient will be medically stable for discharge from the hospital within 2 midnights of admission.   Author: Nolberto Hanlon, MD 12/08/2023 7:50 PM  For on call review [email or web address].

## 2023-12-08 NOTE — Consult Note (Signed)
NEUROLOGY CONSULTATION NOTE   Date of service: December 08, 2023 Patient Name: Corey Oommen Sr. MRN:  [id number] DOB:  01/27/1956 Reason for consult: stroke code Requesting physician: Dr. Estanislado Pandy _ _ _   _ __   _ __ _ _  __ __   _ __   __ _  History of Present Illness   This a 68 year old gentleman who was brought in by EMS for acute onset dysarthria and difficulty word finding.  Last known well was 11:00 AM.  Patient began feeling dizzy and when he called his wife at around 1220 she noticed that he was not speaking normally and was post slurring and having word finding difficulty.  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  Patient's symptoms had nearly resolved by the time he arrived to the ED and his stroke scale was 1 for minimal dysarthria.  CT head showed no acute process.  TNK was not administered due to symptoms being too mild to treat.  He was observed in the ED until he is outside of the TNK window with no worsening of his symptoms.  MRI brain was unremarkable on personal review and he will be admitted for TIA workup.   ROS   Per HPI: all other systems reviewed and are negative  Past History   I have reviewed the following:  Past Medical History:  Diagnosis Date   Headache    migraines   Hepatitis    HX OF hEP c ?    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   Past Surgical History:  Procedure Laterality Date   DECOMPRESSIVE LUMBAR LAMINECTOMY LEVEL 1 N/A 11/20/2023   Procedure: lumbar laminectomy right Lumbar five with decompression of nerve roots and excision of tumor;  Surgeon: [name], MD;  Location: [facility] OR;  Service: Neurosurgery;  Laterality: N/A;   IR RADIOLOGIST EVAL & MGMT  01/25/2020   IR RADIOLOGIST EVAL & MGMT  03/21/2020   IR RADIOLOGIST EVAL & MGMT  06/07/2020   IR RADIOLOGIST EVAL & MGMT  09/05/2020   IR RADIOLOGIST EVAL & MGMT   12/05/2020   IR RADIOLOGIST EVAL & MGMT  05/09/2021   IR RADIOLOGIST EVAL & MGMT  09/06/2021   IR RADIOLOGIST EVAL & MGMT  12/12/2021   IR RADIOLOGIST EVAL & MGMT  06/25/2022   IR RADIOLOGIST EVAL & MGMT  07/24/2022   RADIOLOGY WITH ANESTHESIA N/A 03/01/2020   Procedure: CT WITH ANESTHESIA  THERMAL MICROWAVE  ABLATIION;  Surgeon: [name], MD;  Location: [facility] ORS;  Service: Anesthesiology;  Laterality: N/A;   RADIOLOGY WITH ANESTHESIA N/A 07/17/2022   Procedure: CT MICROWAVE ABLATION;  Surgeon: [name], MD;  Location: [facility] OR[facility];  Service: Radiology;  Laterality: N/A;   Family History  Problem Relation Age of Onset   Arthritis Mother    Cancer Neg Hx    Diabetes Neg Hx    Drug abuse Neg Hx    Heart disease Neg Hx    Hyperlipidemia Neg Hx    Hypertension Neg Hx    Kidney disease Neg Hx    Stroke Neg Hx    Social History   Socioeconomic History   Marital status: Divorced    Spouse name: Not on file   Number of children: 2   Years of education: 12   Highest education level: 12th grade  Occupational History   Occupation: Clinical biochemist: [employer]    Comment: Out on disability for now  Tobacco Use   Smoking status: Former    Passive exposure: Current   Smokeless tobacco: Never   Tobacco comments:    3 per week  Vaping Use   Vaping status: Never Used  Substance and Sexual Activity   Alcohol use: Not Currently   Drug use: No   Sexual activity: Not Currently  Other Topics Concern   Not on file  Social History Narrative   Divorced but has relationship with ex   2 children   Daughter lives with him   Son in       No living will   Ex wife Myraette should make decisions on his behalf   Would accept resuscitation   Not sure about tube feeds   Social Drivers of Health   Financial Resource Strain: Medium Risk (11/06/2023)   Overall Financial Resource Strain (CARDIA)    Difficulty of Paying Living Expenses: Somewhat hard  Food  Insecurity: No Food Insecurity (11/18/2023)   Hunger Vital Sign    Worried About Running Out of Food in the Last Year: Never true    Ran Out of Food in the Last Year: Never true  Transportation Needs: No Transportation Needs (11/18/2023)   PRAPARE - Administrator, Civil Service (Medical): No    Lack of Transportation (Non-Medical): No  Physical Activity: Sufficiently Active (11/06/2023)   Exercise Vital Sign    Days of Exercise per Week: 7 days    Minutes of Exercise per Session: 30 min  Stress: No Stress Concern Present (11/06/2023)   [employer] of Occupational Health - Occupational Stress Questionnaire    Feeling of Stress : Only a little  Social Connections: Moderately Integrated (11/18/2023)   Social Connection and Isolation Panel [NHANES]    Frequency of Communication with Friends and Family: Three times a week    Frequency of Social Gatherings with Friends and Family: More than three times a week    Attends Religious Services: More than 4 times per year    Active Member of Clubs or Organizations: Yes    Attends Engineer, structural: More than 4 times per year    Marital Status: Divorced   No Known Allergies  Medications   (Not in a hospital admission)     Current Facility-Administered Medications:    [START ON 12/09/2023]  stroke: early stages of recovery book, , Does not apply, Once, Nolberto Hanlon, MD   acetaminophen (TYLENOL) tablet 650 mg, 650 mg, Oral, Q4H PRN **OR** acetaminophen (TYLENOL) 160 MG/5ML solution 650 mg, 650 mg, Per Tube, Q4H PRN **OR** acetaminophen (TYLENOL) suppository 650 mg, 650 mg, Rectal, Q4H PRN, Nolberto Hanlon, MD   Melene Muller ON 12/09/2023] enoxaparin (LOVENOX) injection 40 mg, 40 mg, Subcutaneous, Q24H, Nolberto Hanlon, MD   senna-docusate (Senokot-S) tablet 1 tablet, 1 tablet, Oral, QHS PRN, Nolberto Hanlon, MD  Current Outpatient Medications:    acetaminophen (TYLENOL) 500 MG tablet, Take 1,000 mg by mouth every 6 (six) hours as needed.,  Disp: , Rfl:    gabapentin (NEURONTIN) 300 MG capsule, Take 2 capsules (600 mg) in the morning, 2 capsules (600mg ) midday, 3 capsules (900 mg) at bedtime. (Patient not taking: Reported on 12/08/2023), Disp: 210 capsule, Rfl: 1   HYDROcodone Bitartrate ER 20 MG CP12, Take 1 capsule by mouth every 12 (twelve) hours. (Patient not taking: Reported on 12/08/2023), Disp: 60 capsule, Rfl:  0   metoCLOPramide (REGLAN) 10 MG tablet, Take 10 mg by mouth 4 (four) times daily. (Patient not taking: Reported on 12/08/2023), Disp: , Rfl:    ondansetron (ZOFRAN-ODT) 4 MG disintegrating tablet, Take 1 tablet (4 mg total) by mouth every 8 (eight) hours as needed for nausea or vomiting. (Patient not taking: Reported on 12/08/2023), Disp: 20 tablet, Rfl: 0   oxyCODONE (OXY IR/ROXICODONE) 5 MG immediate release tablet, Take 1-2 tablets (5-10 mg total) by mouth every 6 (six) hours as needed for severe pain (pain score 7-10). (Patient not taking: Reported on 12/08/2023), Disp: 45 tablet, Rfl: 0   pantoprazole (PROTONIX) 40 MG tablet, Take 1 tablet (40 mg total) by mouth daily. (Patient not taking: Reported on 12/08/2023), Disp: 30 tablet, Rfl: 3   polyethylene glycol (MIRALAX / GLYCOLAX) 17 g packet, Take 17 g by mouth daily. (Patient not taking: Reported on 12/08/2023), Disp: 30 each, Rfl: 0   prochlorperazine (COMPAZINE) 10 MG tablet, Take 1 tablet (10 mg total) by mouth every 6 (six) hours as needed for nausea or vomiting. (Patient not taking: Reported on 12/08/2023), Disp: 30 tablet, Rfl: 0   senna-docusate (SENOKOT-S) 8.6-50 MG tablet, Take 1 tablet by mouth at bedtime as needed for mild constipation or moderate constipation. (Patient not taking: Reported on 12/08/2023), Disp: 30 tablet, Rfl: 0  Vitals   Vitals:   12/08/23 1500 12/08/23 1515 12/08/23 1746 12/08/23 1815  BP: (!) 151/55 133/61 (!) 112/53 115/66  Pulse: 72 76 73 74  Resp: 18 17 14 17   Temp:   98.5 F (36.9 C)   TempSrc:   Oral   SpO2: 100% 100% 100% 100%   Weight:      Height:         Body mass index is 20.36 kg/m.  Physical Exam   Physical Exam Gen: A&O x4, NAD HEENT: Atraumatic, normocephalic;mucous membranes moist; oropharynx clear, tongue without atrophy or fasciculations. Neck: Supple, trachea midline. Resp: CTAB, no w/r/r CV: RRR, no m/g/r; nml S1 and S2. 2+ symmetric peripheral pulses. Abd: soft/NT/ND; nabs x 4 quad Extrem: Nml bulk; no cyanosis, clubbing, or edema.  Neuro: *MS: A&O x4. Follows multi-step commands.  *Speech: fluid, mild dysarthria, able to name and repeat *CN:    I: Deferred   II,III: PERRLA, VFF by confrontation, optic discs unable to be visualized 2/2 pupillary constriction   III,IV,VI: EOMI w/o nystagmus, no ptosis   V: Sensation intact from V1 to V3 to LT   VII: Eyelid closure was full.  Smile symmetric.   VIII: Hearing intact to voice   IX,X: Voice normal, palate elevates symmetrically    XI: SCM/trap 5/5 bilat   XII: Tongue protrudes midline, no atrophy or fasciculations   *Motor:   Normal bulk.  No tremor, rigidity or bradykinesia. No pronator drift.    Strength: Dlt Bic Tri WrE WrF FgS Gr HF KnF KnE PlF DoF    Left 5 5 5 5 5 5 5 5 5 5 5 5     Right 5 5 5 5 5 5 5 5 5 5 5 5     *Sensory: Intact to light touch, pinprick, temperature vibration throughout. Symmetric. Propioception intact bilat.  No double-simultaneous extinction.  *Coordination:  Finger-to-nose, heel-to-shin, rapid alternating motions were intact. *Reflexes:  2+ and symmetric throughout without clonus; toes down-going bilat *Gait: normal base, normal stride, normal turn. Negative Romberg.  NIHSS = 1 for dysarthria   Premorbid mRS = 0   Labs   CBC:  Recent Labs  Lab 12/08/23 1342 12/08/23 1346  WBC 8.9  --   NEUTROABS 7.2  --   HGB 10.6* 11.6*  HCT 32.3* 34.0*  MCV 99.7  --   PLT 227  --     Basic Metabolic Panel:  Lab Results  Component Value Date   NA 142 12/08/2023   K 4.3 12/08/2023   CO2 23 12/08/2023    GLUCOSE 119 (H) 12/08/2023   BUN 32 (H) 12/08/2023   CREATININE 1.40 (H) 12/08/2023   CALCIUM 10.1 12/08/2023   GFRNONAA 55 (L) 12/08/2023   GFRAA 70 11/14/2020   Lipid Panel:  Lab Results  Component Value Date   LDLCALC 113 (H) 11/26/2017   HgbA1c: No results found for: "HGBA1C" Urine Drug Screen:     Component Value Date/Time   LABOPIA NEGATIVE 04/19/2009 1430   COCAINSCRNUR NEGATIVE 04/19/2009 1430   LABBENZ NEGATIVE 04/19/2009 1430   AMPHETMU NEGATIVE 04/19/2009 1430    Alcohol Level     Component Value Date/Time   ETH <10 12/08/2023 1342    CT Head without contrast: No acute process  MRI brain wo contrast No acute process  CNS imaging personally reviewed  Impression   This a 68 year old gentleman who was brought in by EMS for acute onset dysarthria and difficulty word finding.  Last known well was 11:00 AM.  Patient began feeling dizzy and when he called his wife at around 1220 she noticed that he was not speaking normally and was post slurring and having word finding difficulty.  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  Patient's symptoms had nearly resolved by the time he arrived to the ED and his stroke scale was 1 for minimal dysarthria.  CT head showed no acute process.  TNK was not administered due to symptoms being too mild to treat.  He was observed in the ED until he is outside of the TNK window with no worsening of his symptoms.  MRI brain was unremarkable on personal review and he will be admitted for TIA workup.  Recommendations   - Admit for TIA workup - Goal normotension - CTA/MRA H&N - TTE - Check A1c and LDL + add statin per guidelines - ASA 81mg  daily + plavix 75mg  daily x21 days f/b ASA 81mg  daily monotherapy after that - q4 hr neuro checks - STAT head CT for any change in neuro exam - Tele - PT/OT/SLP - Stroke education - Amb referral to neurology upon  discharge   Stroke team will continue to follow  ______________________________________________________________________   Thank you for the opportunity to take part in the care of this patient. If you have any further questions, please contact the neurology consultation attending.  Signed,  Bing Neighbors, MD [facility] [phone]  If 7pm- 7am, please page neurology on call as listed in AMION.  **Any copied and pasted documentation in this note was written by me in another application not billed for and pasted by me into this document.

## 2023-12-08 NOTE — Assessment & Plan Note (Signed)
During this ER encounter, patient reports standing up once to urinate and having recurrence of his lightheadedness/dizziness.  Therefore I am concerned patient may be having postural hypotension.  I will check postural/orthostatic blood pressures.  Check cortisol in the morning

## 2023-12-08 NOTE — Assessment & Plan Note (Signed)
Based on dr. Mosetta Putt note 10/22/2023 :  diagnosed 11/2019 by MRI for f/u of cirrhosis. S/p microwave ablation on 03/01/20. -MRI 06/01/22 showed suspicion for local recurrence and metastasis at L5. Biopsy of L5 confirmed metastatic HCC. S/p repeat microwave ablation on 07/17/22. -started lenvima 08/16/22 -staging CT CAP and bone scan on 09/19/22 showed: interval enlargement of right hepatic lobe HCC; new retropulsed bony fragment at L5; no other new lesions. His previous CT was in 05/2022 and he did not start Lenvima until 07/2022, so it's hard to evaluate if he is responding to [facility] or not, the progression is likely the nature course of his [facility]  -Restaging CT scan from January 02, 2023 showed slightly improved previously treated liver lesion, and additional area of concern in the liver which are difficult to compare with previous scan due to different timing of contrast.  No other evidence of disease progression.  Plan to obtain MRI as next scan.  I personally reviewed the CT scan images and discussed the findings with patient -Will continue Lenvima, he is tolerating well overall. -Due to his weight being above 60 kg now, I increased his Lenvima to 12 mg daily in Feb 2024 -repeated liver MRI on 04/23/2023 showed stable treated liver lesion, L5 bone mets has slightly increased in size.  Patient developed worsening low back pain in July 2024, repeated lumbar MRI showed significant disease progression in the L5 bone lesion.  I personally reviewed the scan images and discussed the findings with patient  On November 20, 2023 patient underwent spinal surgery Bilateral laminotomies L5-S1 with decompression of the spinal canal and the L5 nerve roots epidural tumor.  And is getting scheduled radiation therapy since then.  Patient has chronic right foot drop accordingly

## 2023-12-08 NOTE — Assessment & Plan Note (Addendum)
Patient presents with acute onset of vertigo and reported difficulty speaking.  Symptoms have resolved since arrival to the ER.  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  Patient is s/p stroke protocol.  MRI brain is negative.  At this time we look forward to neurology input in this regard.  Follow-up echo.  Patient has been started on aspirin and Plavix for 21 days.  DVT prophylaxis with Lovenox ordered. TIA/stroke orderset used.  Bilateral tympanic membranes were healthy to the extent visualised, however obscured by ear wax to a great extent. Will order debrox to allow better visualisation.

## 2023-12-08 NOTE — ED Provider Notes (Signed)
Accepted handoff at shift change from [facility]. Please see prior provider note for more detail.   Briefly: Patient is 68 y.o.   DDX: concern for possible strokelike symptoms.  Brought in by EMS for dizziness, difficulty word finding, last known well at 11 AM.  He called his wife around 1220 when he began feeling dizzy.  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  He had a CT which did not show any acute stroke, and on ED arrival patient reports speech improved but questions whether back to baseline.  Plan per neurology was to reassess at 1600 to see if symptoms are worsening and he would be a candidate for TNK versus continuing to improve, will obtain MR brain, depending on results we will reach back out to neurology or admit for new stroke.  Plan: I independently interpreted MR brain which shows no evidence of acute stroke, I consulted with neurologist, Dr. Selina Cooley who would like the patient admitted for TIA workup, spoke with the hospitalist, Dr. Phoebe Perch who agrees to admission for TIA workup.      Olene Floss, PA-C 12/08/23 1846    Glyn Ade, MD 12/08/23 1940

## 2023-12-08 NOTE — Code Documentation (Signed)
Stroke Response Nurse Documentation Code Documentation  Corey Veron Sr. is a 68 y.o. 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 On No antithrombotic. Code stroke was activated by EMS.   Patient from home where he was LKW at 11:00 and now complaining of aphasia. Starting complaining of dizziness and having difficulty speaking.   Stroke team at the bedside on patient arrival. Labs drawn and patient cleared for CT by EDP. Patient to CT with team. NIHSS 1, see documentation for details and code stroke times. Patient with dysarthria  on exam. The following imaging was completed:  CT Head. Patient is not a candidate for IV Thrombolytic due to mild and improving symptoms in the TNK until 15:30. Patient is not a candidate for IR due to no LVO symptoms upon arrival.   Care Plan: VS and NIHSS Q 30 minutes until 15:30 then q 2 x 12 hours.   Bedside handoff with ED RN Madelynn.    Nechama Guard  Stroke Response RN

## 2023-12-08 NOTE — ED Triage Notes (Signed)
Delayed entry due to direct patient care. Patient presented as pre-hospital code stroke. Patient was brought in from home by EMS for an episode of dizziness, dysarthria, and difficulty word-finding. Last know well was 1100 today. Per EMS, the patient called his wife at approximately 1220 when he began feeling dizzy. Per EMS, patient has history of spinal cancer and had surgery on 1/9 for a tumor removal and is due to start chemotherapy this week. Upon arrival, patient was met at the bridge by primary RN, stroke RN, and neurology. Patient then transported to CT for scan and subsequently brought back to room 28. Patient is AAOx4 upon arrival. Upon arrival, patient reports that his speech has improved but is still not back to baseline.

## 2023-12-08 NOTE — ED Notes (Signed)
CCMD called to have patient on cardiac monitoring.

## 2023-12-08 NOTE — ED Provider Notes (Signed)
[place] EMERGENCY DEPARTMENT AT [facility] Provider Note   CSN: [id number] Arrival date & time: 12/08/23  1339  An emergency department physician performed an initial assessment on this suspected stroke patient at 1340.  History  Chief Complaint  Patient presents with   Dysarthria   Code Stroke    Corey Hessel Sr. is a 68 y.o. 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  Patient reports the dizziness as feeling of lightheadedness that was worse when he was standing.  Last known well was approximately 11 AM.  Upon arrival to emergency room his symptoms had started to improve.  Patient feels back to baseline.  Family member in room agreed.  Denies any chest pain, shortness of breath, fevers or chills.  Denies any focal weakness or radicular symptoms. Had recent surgery 1/9.  HPI     Home Medications Prior to Admission medications   Medication Sig Start Date End Date Taking? Authorizing Provider  gabapentin (NEURONTIN) 300 MG capsule Take 2 capsules (600 mg) in the morning, 2 capsules (600mg ) midday, 3 capsules (900 mg) at bedtime. 10/01/23   [name], [name], NP  HYDROcodone Bitartrate ER 20 MG CP12 Take 1 capsule by mouth every 12 (twelve) hours. 11/13/23   [name], [name], NP  ondansetron (ZOFRAN-ODT) 4 MG disintegrating tablet Take 1 tablet (4 mg total) by mouth every 8 (eight) hours as needed for nausea or vomiting. 10/22/23   Malachy Mood, MD  oxyCODONE (OXY IR/ROXICODONE) 5 MG immediate release tablet Take 1-2 tablets (5-10 mg total) by mouth every 6 (six) hours as needed for severe pain (pain score 7-10). 10/22/23   [name], [name], NP  pantoprazole (PROTONIX) 40 MG tablet Take 1 tablet (40 mg total) by mouth daily. 09/24/23   [name], [name], NP  polyethylene glycol (MIRALAX /  GLYCOLAX) 17 g packet Take 17 g by mouth daily. 11/21/23   Hongalgi, Maximino Greenland, MD  prochlorperazine (COMPAZINE) 10 MG tablet Take 1 tablet (10 mg total) by mouth every 6 (six) hours as needed for nausea or vomiting. 10/22/23   [name], [name], NP  senna-docusate (SENOKOT-S) 8.6-50 MG tablet Take 1 tablet by mouth at bedtime as needed for mild constipation or moderate constipation. 11/21/23   Hongalgi, Maximino Greenland, MD      Allergies    Patient has no known allergies.    Review of Systems   Review of Systems  Neurological:  Positive for headaches.    Physical Exam Updated Vital Signs BP (!) 127/56 (BP Location: Right Arm)   Pulse 74   Temp 98.2 F (36.8 C) (Oral)   Resp 18   Ht 5\' 7"  (1.702 m)   Wt 59 kg   SpO2 100%   BMI 20.36 kg/m  Physical Exam Vitals and nursing note reviewed.  Constitutional:      General: He is not in acute distress.    Appearance: He is not toxic-appearing.  HENT:     Head: Normocephalic and atraumatic.  Eyes:     General: No scleral icterus.    Conjunctiva/sclera: Conjunctivae normal.  Cardiovascular:     Rate and Rhythm: Normal rate and regular rhythm.     Pulses: Normal pulses.     Heart sounds: Normal heart sounds.  Pulmonary:     Effort: Pulmonary effort is normal. No respiratory distress.     Breath sounds: Normal breath sounds.  Abdominal:     General: Abdomen is flat. Bowel sounds are normal.     Palpations: Abdomen is soft.     Tenderness: There is no abdominal tenderness.  Musculoskeletal:     Right lower leg: No edema.     Left lower leg: No edema.  Skin:    General: Skin is warm and dry.     Findings: No lesion.     Comments: Approx 6in incision, clean, dry, intact  Neurological:     General: No focal deficit present.     Mental Status: He is alert and oriented to person, place, and time. Mental status is at baseline.     Comments: Patient is alert and oriented.  No slurred speech.  No ataxia on exam.  Upper and lower  extremity strength equal bilaterally sensation equal bilaterally. No crania nerve deficits. Strong radial pulse, equal bilaterally.      ED Results / Procedures / Treatments   Labs (all labs ordered are listed, but only abnormal results are displayed) Labs Reviewed  CBC - Abnormal; Notable for the following components:      Result Value   RBC 3.24 (*)    Hemoglobin 10.6 (*)    HCT 32.3 (*)    All other components within normal limits  I-STAT CHEM 8, ED - Abnormal; Notable for the following components:   BUN 32 (*)    Creatinine, Ser 1.40 (*)    Glucose, Bld 119 (*)    Hemoglobin 11.6 (*)    HCT 34.0 (*)    All other components within normal limits  CBG MONITORING, ED - Abnormal; Notable for the following components:   Glucose-Capillary 118 (*)    All other components within normal limits  PROTIME-INR  APTT  DIFFERENTIAL  COMPREHENSIVE METABOLIC PANEL  ETHANOL    EKG None  Radiology CT HEAD CODE STROKE WO CONTRAST Result Date: 12/08/2023 CLINICAL DATA:  Code stroke. Neuro deficit, acute, stroke suspected. EXAM: CT HEAD WITHOUT CONTRAST TECHNIQUE: Contiguous axial images were obtained from the base of the skull through the vertex without intravenous contrast. RADIATION DOSE REDUCTION: This exam was performed according to the departmental dose-optimization program which includes automated exposure control, adjustment of the mA and/or kV according to patient size and/or use of iterative reconstruction technique. COMPARISON:  None Available. FINDINGS: Brain: No evidence of acute infarction, hemorrhage, hydrocephalus, extra-axial collection or mass lesion/mass effect. Vascular: No hyperdense vessel or unexpected calcification. Skull: Normal. Negative for fracture or focal lesion. Sinuses/Orbits: No acute finding. Other: None. ASPECTS [facility] Stroke Program Early CT Score) - Ganglionic level infarction (caudate, lentiform nuclei, internal capsule, insula, M1-M3 cortex): 7 -  Supraganglionic infarction (M4-M6 cortex): 3 Total score (0-10 with 10 being normal): 10 IMPRESSION: 1. No acute intracranial pathology. 2. ASPECTS is 10. These results communicated via AMION text age at the time of interpretation on 12/08/2023 at 2:02 pm to Dr. Selina Cooley, who verbally acknowledged these results. Electronically Signed   By: Baldemar Lenis M.D.   On: 12/08/2023 14:03    Procedures Procedures    Medications Ordered in ED Medications  sodium chloride flush (NS) 0.9 % injection 3 mL (3 mLs Intravenous Given 12/08/23 1408)    ED Course/ Medical Decision Making/ A&P Clinical Course as of 12/08/23 1618  Mon Dec 08, 2023  1556 Patient reports continuing to feel improved, no lingering dysarthria, word finding difficulties, or dizziness.  Per original neurology plan will obtain MR brain and then discussed with, possible admit for  TIA workup versus new stroke depending on results of MRI. [CP]    Clinical Course User Index [CP] Olene Floss, PA-C                                 Medical Decision Making Amount and/or Complexity of Data Reviewed Labs: ordered. Radiology: ordered.   This patient presents to the ED for concern of dysarthria, this involves an extensive number of treatment options, and is a complaint that carries with it a high risk of complications and morbidity.  The differential diagnosis includes CVA, TIA, vertigo, dehydration, presyncope, PE, sepsis, hyperglycemia, hypoglycemia    Co morbidities that complicate the patient evaluation  Cancer HTN   Additional history obtained:  Additional history obtained from 12/04/22   Lab Tests:  I personally interpreted labs.  The pertinent results include:   Initial blood glucose 118 CBC without leukocytosis, hemoglobin is 10.6 CMP without significant electrolyte abnormality creatinine is 1.41 appears to be at baseline  EtOH under 10   Imaging Studies ordered:  I ordered imaging studies  including CT head  I independently visualized and interpreted imaging which showed no acute intercranial abnormality I agree with the radiologist interpretation   Cardiac Monitoring: / EKG:  The patient was maintained on a cardiac monitor.  I personally viewed and interpreted the cardiac monitored which showed an underlying rhythm of: sinus    Consultations Obtained:  Attending Dr Maple Hudson spoke with neuro,  and discussed presentation and imaging findings as well as pertinent plan - they recommend: MRI of brain following reassuring CT head and reassessment. Recommend admit for acute stroke versus re consult.   Problem List / ED Course / Critical interventions / Medication management  Patient presenting with dysarthria and dizziness. Last known well 11am. On arrival to ED patient was code stroke, neuro assessed patient. Recommending Head CT and MR, pending reassessment. On my initial eval, patient in no acute distress. He is alert and oriented answering questions appropriately, no slurred speech noted. No focal weakness, change in sensation or ataxia. He is hemodynamically stable. BG within normal limits. Discussed Heat CT and labs with family and patient at bedside. Discussed further workup with MRI was recommended by neuro.  Of note, patient post-op, not reporting complications. Patients incision clean, dry, intact.   I have reviewed the patients home medicines and have made adjustments as needed   Plan  Per neurology patient needs MRI following period of observation.    Although patient is in window, symptoms are mild and improving on their own, neuro not recommending TNK.          Final Clinical Impression(s) / ED Diagnoses Final diagnoses:  None    Rx / DC Orders ED Discharge Orders     None         Corey Knudsen, PA-C 12/08/23 1628    Coral Spikes, DO 12/09/23 1457

## 2023-12-09 ENCOUNTER — Observation Stay (HOSPITAL_COMMUNITY): Payer: Commercial Managed Care - PPO

## 2023-12-09 DIAGNOSIS — G459 Transient cerebral ischemic attack, unspecified: Secondary | ICD-10-CM | POA: Diagnosis not present

## 2023-12-09 LAB — LIPID PANEL
Cholesterol: 177 mg/dL (ref 0–200)
HDL: 45 mg/dL (ref >40)
LDL Cholesterol: 123 mg/dL — ABNORMAL HIGH (ref 0–99)
Total CHOL/HDL Ratio: 3.9 {ratio}
Triglycerides: 47 mg/dL (ref <150)
VLDL: 9 mg/dL (ref 0–40)

## 2023-12-09 LAB — APTT: aPTT: 29 s (ref 24–36)

## 2023-12-09 LAB — TSH: TSH: 0.818 u[IU]/mL (ref 0.350–4.500)

## 2023-12-09 LAB — PROTIME-INR
INR: 1.1 (ref 0.8–1.2)
Prothrombin Time: 14.4 s (ref 11.4–15.2)

## 2023-12-09 LAB — CORTISOL: Cortisol, Plasma: 17.8 ug/dL

## 2023-12-09 MED ORDER — IOHEXOL 350 MG/ML SOLN
75.0000 mL | Freq: Once | INTRAVENOUS | Status: AC | PRN
  Administered 2023-12-09: 75 mL via INTRAVENOUS

## 2023-12-09 MED ORDER — ATORVASTATIN CALCIUM 40 MG PO TABS: 40.0000 mg | ORAL_TABLET | Freq: Every day | ORAL | 11 refills | Status: DC

## 2023-12-09 MED ORDER — CLOPIDOGREL BISULFATE 75 MG PO TABS: 75.0000 mg | ORAL_TABLET | Freq: Every day | ORAL | 0 refills | Status: AC

## 2023-12-09 MED ORDER — ASPIRIN 81 MG PO TBEC: 81.0000 mg | DELAYED_RELEASE_TABLET | Freq: Every day | ORAL | 12 refills | Status: AC

## 2023-12-09 NOTE — Evaluation (Signed)
Physical Therapy Evaluation Patient Details Name: Corey Armstrong. MRN: [id number] DOB: [birth year] Today's Date: 12/09/2023  History of Present Illness  Pt is a 68 y.o. male who presented to the ED 1/27 with c/o dizziness and slurred speech. MRI negative. Admitted with dx of TIA. Recently underwent L5-S1 bilat lami and tumor excision, 11/20/23. PMH: hepatocellular ca with mets, essential HTN, chronic pain syndrome, anemia, AKI, CKD   Clinical Impression  PT eval complete. Pt demo mod I bed mobility. Supervision transfers and amb 300' without AD. RLE deficits from previous spinal tumor, for which he underwent sx 1/9. No acute deficits noted. Pt reports symptoms that brought him to the hospital have resolved. No further acute care PT indicated. Recommend return to OPPT.         If plan is discharge home, recommend the following:     Can travel by private vehicle        Equipment Recommendations None recommended by PT  Recommendations for Other Services       Functional Status Assessment Patient has had a recent decline in their functional status and demonstrates the ability to make significant improvements in function in a reasonable and predictable amount of time.     Precautions / Restrictions Precautions Precautions: Fall;Back;Other (comment) Precaution Comments: R foot drop, reviewed spinal precautions Restrictions Other Position/Activity Restrictions: no back brace      Mobility  Bed Mobility Overal bed mobility: Modified Independent             General bed mobility comments: increased time    Transfers Overall transfer level: Needs assistance Equipment used: None Transfers: Sit to/from Stand Sit to Stand: Supervision                Ambulation/Gait Ambulation/Gait assistance: Supervision Gait Distance (Feet): 300 Feet Assistive device: None Gait Pattern/deviations: Step-through pattern, Decreased weight shift to right, Steppage Gait velocity:  WFL Gait velocity interpretation: 1.31 - 2.62 ft/sec, indicative of limited community ambulator   General Gait Details: steppage gait on R due to foot drop. Reports he is in the process of getting AFO.  Stairs            Wheelchair Mobility     Tilt Bed    Modified Rankin (Stroke Patients Only)       Balance Overall balance assessment: Mild deficits observed, not formally tested                                           Pertinent Vitals/Pain Pain Assessment Pain Assessment: No/denies pain    Home Living Family/patient expects to be discharged to:: Private residence Living Arrangements: Non-relatives/Friends (ex wife) Available Help at Discharge: Friend(s) Type of Home: House Home Access: Ramped entrance       Home Layout: One level Home Equipment: Agricultural consultant (2 wheels);Cane - single point;Rollator (4 wheels);Shower seat;Grab bars - toilet;Grab bars - tub/shower      Prior Function Prior Level of Function : Independent/Modified Independent;Driving             Mobility Comments: SPC vs no AD in the home, rollator outside of the home. ADLs Comments: Drives intermittently, ADLs indep, ex-wife and dtr assist as needed. Family assists with IADLs     Extremity/Trunk Assessment   Upper Extremity Assessment Upper Extremity Assessment: Defer to OT evaluation    Lower Extremity Assessment Lower Extremity Assessment: RLE deficits/detail  RLE Deficits / Details: 4/5 hip and knee, 2/5 at ankle, foot drop RLE Sensation: decreased light touch    Cervical / Trunk Assessment Cervical / Trunk Assessment: Back Surgery  Communication   Communication Communication: No apparent difficulties  Cognition Arousal: Alert Behavior During Therapy: WFL for tasks assessed/performed Overall Cognitive Status: Within Functional Limits for tasks assessed                                          General Comments General comments (skin  integrity, edema, etc.): VSS on RA    Exercises     Assessment/Plan    PT Assessment All further PT needs can be met in the next venue of care  PT Problem List Decreased balance;Decreased mobility;Decreased activity tolerance;Decreased strength       PT Treatment Interventions      PT Goals (Current goals can be found in the Care Plan section)  Acute Rehab PT Goals Patient Stated Goal: return to OP PT Goal Formulation: All assessment and education complete, DC therapy    Frequency       Co-evaluation               AM-PAC PT "6 Clicks" Mobility  Outcome Measure Help needed turning from your back to your side while in a flat bed without using bedrails?: None Help needed moving from lying on your back to sitting on the side of a flat bed without using bedrails?: None Help needed moving to and from a bed to a chair (including a wheelchair)?: A Little Help needed standing up from a chair using your arms (e.g., wheelchair or bedside chair)?: A Little Help needed to walk in hospital room?: A Little Help needed climbing 3-5 steps with a railing? : A Little 6 Click Score: 20    End of Session   Activity Tolerance: Patient tolerated treatment well Patient left: in bed;with call bell/phone within reach Nurse Communication: Mobility status PT Visit Diagnosis: Difficulty in walking, not elsewhere classified (R26.2)    Time: 8119-1478 PT Time Calculation (min) (ACUTE ONLY): 21 min   Charges:   PT Evaluation $PT Eval Low Complexity: 1 Low   PT General Charges $$ ACUTE PT VISIT: 1 Visit         Ferd Glassing., PT  Office # [phone]   Ilda Foil 12/09/2023, 9:29 AM

## 2023-12-09 NOTE — Evaluation (Signed)
Occupational Therapy Evaluation Patient Details Name: Corey Armstrong. MRN: [id number] DOB: Nov 12, 1955 Today's Date: 12/09/2023   History of Present Illness Pt is a 68 y.o. male who presented to the ED 1/27 with c/o dizziness and slurred speech. MRI negative. Admitted with dx of TIA. Recently underwent L5-S1 bilat lami and tumor excision, 11/20/23. PMH: hepatocellular ca with mets, essential HTN, chronic pain syndrome, anemia, AKI, CKD   Clinical Impression   Patient presenting with above diagnosis.  As of now, patient's symptoms have resolved, and he is at his baseline for ADL,iADL and mobility.   No acute OT needs exist.  No post acute OT anticipated.  Home with prior level of supports.        If plan is discharge home, recommend the following: Assist for transportation;Assistance with cooking/housework    Functional Status Assessment  Patient has had a recent decline in their functional status and demonstrates the ability to make significant improvements in function in a reasonable and predictable amount of time.  Equipment Recommendations  None recommended by OT    Recommendations for Other Services       Precautions / Restrictions Precautions Precautions: Fall;Back;Other (comment) Precaution Booklet Issued: No Precaution Comments: R foot drop, reviewed spinal precautions Restrictions Weight Bearing Restrictions Per Provider Order: No Other Position/Activity Restrictions: no back brace      Mobility Bed Mobility Overal bed mobility: Modified Independent                  Transfers Overall transfer level: Modified independent Equipment used: None                      Balance Overall balance assessment: Mild deficits observed, not formally tested                                         ADL either performed or assessed with clinical judgement   ADL Overall ADL's : At baseline                                              Vision Patient Visual Report: No change from baseline       Perception Perception: Within Functional Limits       Praxis Praxis: WFL       Pertinent Vitals/Pain Pain Assessment Pain Assessment: Faces Faces Pain Scale: Hurts little more Pain Location: low back Pain Descriptors / Indicators: Aching Pain Intervention(s): Monitored during session     Extremity/Trunk Assessment Upper Extremity Assessment Upper Extremity Assessment: Overall WFL for tasks assessed   Lower Extremity Assessment Lower Extremity Assessment: Defer to PT evaluation RLE Deficits / Details: 4/5 hip and knee, 2/5 at ankle, foot drop RLE Sensation: decreased light touch   Cervical / Trunk Assessment Cervical / Trunk Assessment: Back Surgery   Communication Communication Communication: No apparent difficulties   Cognition Arousal: Alert Behavior During Therapy: WFL for tasks assessed/performed Overall Cognitive Status: Within Functional Limits for tasks assessed                                       General Comments  VSS on RA    Exercises  Shoulder Instructions      Home Living Family/patient expects to be discharged to:: Private residence Living Arrangements: Non-relatives/Friends Available Help at Discharge: Family Type of Home: House Home Access: Ramped entrance     Home Layout: One level     Bathroom Shower/Tub: Arts development officer Toilet: Handicapped height Bathroom Accessibility: Yes   Home Equipment: Agricultural consultant (2 wheels);Cane - single point;Rollator (4 wheels);Shower seat;Grab bars - toilet;Grab bars - tub/shower   Additional Comments: will be staying with his ex-wife while he recovers      Prior Functioning/Environment Prior Level of Function : Independent/Modified Independent;Driving             Mobility Comments: SPC vs no AD in the home, rollator outside of the home. ADLs Comments: Drives intermittently, ADLs indep,  ex-wife and dtr assist as needed. Family assists with IADLs        OT Problem List: Impaired balance (sitting and/or standing)      OT Treatment/Interventions:      OT Goals(Current goals can be found in the care plan section) Acute Rehab OT Goals Patient Stated Goal: Home OT Goal Formulation: With patient Time For Goal Achievement: 12/12/23 Potential to Achieve Goals: Good  OT Frequency:      Co-evaluation              AM-PAC OT "6 Clicks" Daily Activity     Outcome Measure Help from another person eating meals?: None Help from another person taking care of personal grooming?: None Help from another person toileting, which includes using toliet, bedpan, or urinal?: None Help from another person bathing (including washing, rinsing, drying)?: None Help from another person to put on and taking off regular upper body clothing?: None Help from another person to put on and taking off regular lower body clothing?: None 6 Click Score: 24   End of Session Nurse Communication: Mobility status  Activity Tolerance: Patient tolerated treatment well Patient left: in bed;with call bell/phone within reach;with bed alarm set;with family/visitor present  OT Visit Diagnosis: Other abnormalities of gait and mobility (R26.89)                Time: 1610-9604 OT Time Calculation (min): 24 min Charges:  OT General Charges $OT Visit: 1 Visit OT Evaluation $OT Eval Moderate Complexity: 1 Mod OT Treatments $Self Care/Home Management : 8-22 mins  12/09/2023  RP, OTR/L  Acute Rehabilitation Services  Office:  [phone]   Suzanna Obey 12/09/2023, 9:52 AM

## 2023-12-09 NOTE — Progress Notes (Addendum)
STROKE TEAM PROGRESS NOTE   BRIEF HPI Mr. [name]. is a 68 y.o. male with PMH significant for HTN, Heart Murmur, Hepatocellular cancer with bone mets, headache, hepatitis who presented to ED 1/27 d/t feeling dizzy, slurred speech and word-finding difficulties. Symptoms resolved by ED arrival. NIH 1 for slight dysarthria.  Negative CTH. MRI negative. Admitted for TIA workup.  NIH on Admission: 1 (slight dysarthria)  INTERIM HISTORY/SUBJECTIVE  Patient describes these symptoms lasting an hour to an hour and a half, around noon yesterday.  Minimal dysarthria present on exam, no other neuro deficits seen.  Plan of care and follow-up needs discussed thoroughly with patient and wife at bedside   OBJECTIVE  CBC    Component Value Date/Time   WBC 5.3 12/08/2023 2215   RBC 3.03 (L) 12/08/2023 2215   HGB 9.8 (L) 12/08/2023 2215   HGB 12.1 (L) 10/22/2023 1110   HCT 29.9 (L) 12/08/2023 2215   PLT 212 12/08/2023 2215   PLT 181 10/22/2023 1110   MCV 98.7 12/08/2023 2215   MCH 32.3 12/08/2023 2215   MCHC 32.8 12/08/2023 2215   RDW 13.4 12/08/2023 2215   LYMPHSABS 0.9 12/08/2023 1342   MONOABS 0.7 12/08/2023 1342   EOSABS 0.1 12/08/2023 1342   BASOSABS 0.0 12/08/2023 1342    BMET    Component Value Date/Time   NA 142 12/08/2023 1346   K 4.3 12/08/2023 1346   CL 108 12/08/2023 1346   CO2 23 12/08/2023 1342   GLUCOSE 119 (H) 12/08/2023 1346   BUN 32 (H) 12/08/2023 1346   CREATININE 1.24 12/08/2023 2215   CREATININE 1.22 10/22/2023 1110   CREATININE 1.07 09/19/2021 1412   CALCIUM 10.1 12/08/2023 1342   EGFR 78 09/19/2021 1412   GFRNONAA >60 12/08/2023 2215   GFRNONAA >60 10/22/2023 1110   GFRNONAA 60 11/14/2020 0958    IMAGING past 24 hours DG CHEST PORT 1 VIEW Result Date: 12/08/2023 CLINICAL DATA:  Postural dizziness EXAM: PORTABLE CHEST 1 VIEW COMPARISON:  07/17/2022 FINDINGS: The heart size and mediastinal contours are within normal limits. Both lungs are  clear. The visualized skeletal structures are unremarkable. IMPRESSION: No active disease. Electronically Signed   By: Alcide Clever M.D.   On: 12/08/2023 20:42   MR BRAIN WO CONTRAST Result Date: 12/08/2023 CLINICAL DATA:  Neuro deficit, acute, stroke suspected EXAM: MRI HEAD WITHOUT CONTRAST TECHNIQUE: Multiplanar, multiecho pulse sequences of the brain and surrounding structures were obtained without intravenous contrast. COMPARISON:  CT head from the same day. FINDINGS: Brain: No acute infarction, hemorrhage, hydrocephalus, extra-axial collection or mass lesion. Small remote left cerebellar infarct. Mild for age chronic microvascular ischemic change. Vascular: Major arterial flow voids are maintained at the skull base. Skull and upper cervical spine: Normal marrow signal. Sinuses/Orbits: Clear sinuses.  No acute orbital findings. Other: No mastoid effusions IMPRESSION: No evidence of acute intracranial abnormality. Electronically Signed   By: Feliberto Harts M.D.   On: 12/08/2023 17:16   CT HEAD CODE STROKE WO CONTRAST Result Date: 12/08/2023 CLINICAL DATA:  Code stroke. Neuro deficit, acute, stroke suspected. EXAM: CT HEAD WITHOUT CONTRAST TECHNIQUE: Contiguous axial images were obtained from the base of the skull through the vertex without intravenous contrast. RADIATION DOSE REDUCTION: This exam was performed according to the departmental dose-optimization program which includes automated exposure control, adjustment of the mA and/or kV according to patient size and/or use of iterative reconstruction technique. COMPARISON:  None Available. FINDINGS: Brain: No evidence of acute infarction, hemorrhage, hydrocephalus, extra-axial  collection or mass lesion/mass effect. Vascular: No hyperdense vessel or unexpected calcification. Skull: Normal. Negative for fracture or focal lesion. Sinuses/Orbits: No acute finding. Other: None. ASPECTS [facility] Stroke Program Early CT Score) - Ganglionic level infarction  (caudate, lentiform nuclei, internal capsule, insula, M1-M3 cortex): 7 - Supraganglionic infarction (M4-M6 cortex): 3 Total score (0-10 with 10 being normal): 10 IMPRESSION: 1. No acute intracranial pathology. 2. ASPECTS is 10. These results communicated via AMION text age at the time of interpretation on 12/08/2023 at 2:02 pm to Dr. Selina Cooley, who verbally acknowledged these results. Electronically Signed   By: Baldemar Lenis M.D.   On: 12/08/2023 14:03    Vitals:   12/09/23 0415 12/09/23 0440 12/09/23 0712 12/09/23 0716  BP: 127/60   (!) 151/63  Pulse: 66 69  70  Resp: 18 (!) 29  15  Temp:   98.1 F (36.7 C)   TempSrc:   Oral   SpO2: 100% 100%  100%  Weight:      Height:         PHYSICAL EXAM General:  Alert, well-nourished, well-developed patient in no acute distress Psych:  Mood and affect appropriate for situation CV: Regular rate and rhythm on monitor Respiratory:  Regular, unlabored respirations on room air GI: Abdomen soft and nontender   NEURO:  Mental Status: AA&Ox3, patient is able to give clear and coherent history Speech/Language: speech with slight dysarthria. No aphasia.  Naming, repetition, fluency, and comprehension intact.  Cranial Nerves:  II: PERRL. Visual fields full.  III, IV, VI: EOMI. Eyelids elevate symmetrically.  V: Sensation is intact to light touch and symmetrical to face.  VII: Face is symmetrical resting and smiling VIII: hearing intact to voice. IX, X: Palate elevates symmetrically. Phonation is normal.  GB:TDVVOHYW shrug 5/5. XII: tongue is midline without fasciculations. Motor: 5/5 strength to all muscle groups tested.  Tone: is normal and bulk is normal Sensation- subjectively intact to light touch bilaterally. Extinction absent to light touch to DSS.  History of decreased sensation to R foot at baseline Coordination: FTN intact bilaterally, HKS: no ataxia in BLE.No drift.  Gait- deferred  Most Recent NIH: 1     ASSESSMENT/PLAN  Posterior Circulation TIA Etiology:   atherosclerotic, large vessel disease Code Stroke CT head: No acute abnormality. ASPECTS 10.    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 MRI: No evidence of acute intracranial abnormality.   2D Echo:  1/8 ECHO: LVEF 60-65%, Grade II diastolic dysfunction, Mildly dilated left atria Recommend 30-day monitor on discharge LDL 123 HgbA1c 5.4 VTE prophylaxis - lovenox No antithrombotic prior to admission, continue aspirin 81 mg daily and clopidogrel 75 mg daily for 3 months due to intracranial stenosis and then aspirin alone. Therapy recommendations:  Outpatient PT/OT/ST Disposition:  discharge home  Hypertension Home meds:  none Stable Blood Pressure Goal: BP less than 220/110 for 24-48 hours post-symptom onset, then gradually normalize. Avoid hypotension.   Hyperlipidemia Home meds:none LDL 123, goal < 70 Add Lipitor 40mg   Continue statin at discharge  Diabetes type II, no history Home meds:  none HgbA1c 5.4, goal < 7.0 CBGs SSI  Tobacco Abuse Former cigarette smoker  Hospital day # 0  Patient is OK for discharge from neurology standpoint, with recommendations as above. Follow-up with outpatient neurology in 8 weeks.    Pt seen by Neuro NP/APP and later by MD. Note/plan to be edited by MD as  needed.    Lynnae January, DNP, AGACNP-BC [facility] Please use AMION for contact information & EPIC for messaging.  I have personally obtained history,examined this patient, reviewed notes, independently viewed imaging studies, participated in medical decision making and plan of care.ROS completed by me personally and pertinent positives fully documented  I have made any additions or clarifications directly to the above note. Agree with note above.  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  Recommend aggressive medical management with dual antiplatelet therapy aspirin and Plavix for 3 months followed by aspirin alone.  Follow-up as an outpatient stroke clinic in 2 months.  Discussed with Dr.Ghimire Greater than 50% time during this 50-minute visit was spent in counseling and coordination of care and discussion with patient and care team and answering questions about his TIA and vertebral artery stenosis.  Delia Heady, MD Medical Director [facility] Pager: [phone] 12/09/2023 5:08 PM   To contact Stroke Continuity provider, please refer to [email or web address]. After hours, contact General Neurology

## 2023-12-09 NOTE — Discharge Summary (Signed)
Physician Discharge Summary  Corey Stuber Sr. GNF:[id number] DOB: May 02, 1956 DOA: 12/08/2023  PCP: [name], MD  Admit date: 12/08/2023 Discharge date: 12/09/2023  Admitted From: Home Disposition: Home  Recommendations for Outpatient Follow-up:  Follow up with PCP in 1-2 weeks Please obtain BMP/CBC in one week Continue follow-up at cancer center Neurology to schedule follow-up  Home Health: N/A Equipment/Devices: N/A  Discharge Condition: Stable CODE STATUS: Full code Diet recommendation: Low-salt diet, nutritional supplements  Discharge summary:  68 year old gentleman with history of hypertension, hepatocellular cancer with bony mets recently started on chemotherapy who presented to the emergency room with feeling dizzy, slurred speech and word finding difficulties.  Symptoms resolved by the time he arrived to the ER.  He underwent a stroke workup that included negative CT scan of the head, negative MRI of the brain, normal echocardiogram, negative CT angiogram of the head and neck with presence of some atherosclerosis.  Patient gradually improved and currently back to his usual self.  No neurological deficits today.  TIA: Currently no neurological deficits.  No reversible factors found other than atherosclerotic disease on his CT scan of the head and neck.  As per neurology recommendations he will be going home with Aspirin and Plavix for 3 weeks then aspirin alone LDL 123, will start on atorvastatin 40 mg daily Currently not needing any additional therapies. Neurology to schedule outpatient follow-up. Patient to continue care at cancer center for his newly diagnosed hepatocellular carcinoma.  Stable for discharge.  Discharge Diagnoses:  Principal Problem:   TIA (transient ischemic attack) Active Problems:   Hepatocellular carcinoma in adult [facility])   Postural dizziness with presyncope    Discharge Instructions  Discharge Instructions     Diet general    Complete by: As directed    Increase activity slowly   Complete by: As directed       Allergies as of 12/09/2023   No Known Allergies      Medication List     STOP taking these medications    gabapentin 300 MG capsule Commonly known as: NEURONTIN   HYDROcodone Bitartrate ER 20 MG Cp12   metoCLOPramide 10 MG tablet Commonly known as: REGLAN   ondansetron 4 MG disintegrating tablet Commonly known as: ZOFRAN-ODT   oxyCODONE 5 MG immediate release tablet Commonly known as: Oxy IR/ROXICODONE   pantoprazole 40 MG tablet Commonly known as: Protonix   polyethylene glycol 17 g packet Commonly known as: MIRALAX / GLYCOLAX   prochlorperazine 10 MG tablet Commonly known as: COMPAZINE   senna-docusate 8.6-50 MG tablet Commonly known as: Senokot-S       TAKE these medications    acetaminophen 500 MG tablet Commonly known as: TYLENOL Take 1,000 mg by mouth every 6 (six) hours as needed.   aspirin EC 81 MG tablet Take 1 tablet (81 mg total) by mouth daily. Swallow whole. Start taking on: December 10, 2023   atorvastatin 40 MG tablet Commonly known as: Lipitor Take 1 tablet (40 mg total) by mouth daily.   clopidogrel 75 MG tablet Commonly known as: PLAVIX Take 1 tablet (75 mg total) by mouth daily for 21 days. Start taking on: December 10, 2023        No Known Allergies  Consultations: Neurology   Procedures/Studies: CT ANGIO HEAD NECK W WO CM Result Date: 12/09/2023 CLINICAL DATA:  Neuro deficit with acute stroke suspected EXAM: CT ANGIOGRAPHY HEAD AND NECK WITH AND WITHOUT CONTRAST TECHNIQUE: Multidetector CT imaging of the head and neck was performed  using the standard protocol during bolus administration of intravenous contrast. Multiplanar CT image reconstructions and MIPs were obtained to evaluate the vascular anatomy. Carotid stenosis measurements (when applicable) are obtained utilizing NASCET criteria, using the distal internal carotid diameter as the  denominator. RADIATION DOSE REDUCTION: This exam was performed according to the departmental dose-optimization program which includes automated exposure control, adjustment of the mA and/or kV according to patient size and/or use of iterative reconstruction technique. CONTRAST:  75mL OMNIPAQUE IOHEXOL 350 MG/ML SOLN COMPARISON:  Brain MRI from yesterday FINDINGS: CT HEAD FINDINGS Brain: No evidence of acute infarction, hemorrhage, hydrocephalus, extra-axial collection or mass lesion/mass effect. Vascular: No hyperdense vessel or unexpected calcification. Skull: Normal. Negative for fracture or focal lesion. Sinuses/Orbits: No acute finding. Review of the MIP images confirms the above findings CTA NECK FINDINGS Aortic arch: Not covered. Right carotid system: The brachiocephalic origin is not covered. Atheromatous plaque especially about the bifurcation without significant stenosis. No ulceration. Left carotid system: The left common carotid arises from the brachiocephalic artery and shows atheromatous wall thickening of the common carotid and mixed density plaque at the proximal ICA. No stenosis or ulceration. Vertebral arteries: No proximal subclavian stenosis. Partially obscured proximal right vertebral artery due to streak artifact. Focal stenosis of the right vertebral artery at the C3 level measuring 60%. The right vertebral artery is dominant. No generalized irregularity or flap Skeleton: C4-5 and C5-6 disc space narrowing. Facet osteoarthritis at C4-5 with anterolisthesis. Other neck: No acute finding Upper chest: No acute finding Review of the MIP images confirms the above findings CTA HEAD FINDINGS Anterior circulation: No significant stenosis, proximal occlusion, aneurysm, or vascular malformation. Posterior circulation: No significant stenosis, proximal occlusion, aneurysm, or vascular malformation. Venous sinuses: Unremarkable Anatomic variants: Fetal type bilateral PCA flow. Review of the MIP images  confirms the above findings IMPRESSION: 1. No emergent finding. 2. Atherosclerosis most notably causing 60% stenosis in the dominant right cervical vertebral artery. Electronically Signed   By: Tiburcio Pea M.D.   On: 12/09/2023 09:51   DG CHEST PORT 1 VIEW Result Date: 12/08/2023 CLINICAL DATA:  Postural dizziness EXAM: PORTABLE CHEST 1 VIEW COMPARISON:  07/17/2022 FINDINGS: The heart size and mediastinal contours are within normal limits. Both lungs are clear. The visualized skeletal structures are unremarkable. IMPRESSION: No active disease. Electronically Signed   By: Alcide Clever M.D.   On: 12/08/2023 20:42   MR BRAIN WO CONTRAST Result Date: 12/08/2023 CLINICAL DATA:  Neuro deficit, acute, stroke suspected EXAM: MRI HEAD WITHOUT CONTRAST TECHNIQUE: Multiplanar, multiecho pulse sequences of the brain and surrounding structures were obtained without intravenous contrast. COMPARISON:  CT head from the same day. FINDINGS: Brain: No acute infarction, hemorrhage, hydrocephalus, extra-axial collection or mass lesion. Small remote left cerebellar infarct. Mild for age chronic microvascular ischemic change. Vascular: Major arterial flow voids are maintained at the skull base. Skull and upper cervical spine: Normal marrow signal. Sinuses/Orbits: Clear sinuses.  No acute orbital findings. Other: No mastoid effusions IMPRESSION: No evidence of acute intracranial abnormality. Electronically Signed   By: Feliberto Harts M.D.   On: 12/08/2023 17:16   CT HEAD CODE STROKE WO CONTRAST Result Date: 12/08/2023 CLINICAL DATA:  Code stroke. Neuro deficit, acute, stroke suspected. EXAM: CT HEAD WITHOUT CONTRAST TECHNIQUE: Contiguous axial images were obtained from the base of the skull through the vertex without intravenous contrast. RADIATION DOSE REDUCTION: This exam was performed according to the departmental dose-optimization program which includes automated exposure control, adjustment of the mA and/or kV  according to patient size and/or use of iterative reconstruction technique. COMPARISON:  None Available. FINDINGS: Brain: No evidence of acute infarction, hemorrhage, hydrocephalus, extra-axial collection or mass lesion/mass effect. Vascular: No hyperdense vessel or unexpected calcification. Skull: Normal. Negative for fracture or focal lesion. Sinuses/Orbits: No acute finding. Other: None. ASPECTS [facility] Stroke Program Early CT Score) - Ganglionic level infarction (caudate, lentiform nuclei, internal capsule, insula, M1-M3 cortex): 7 - Supraganglionic infarction (M4-M6 cortex): 3 Total score (0-10 with 10 being normal): 10 IMPRESSION: 1. No acute intracranial pathology. 2. ASPECTS is 10. These results communicated via AMION text age at the time of interpretation on 12/08/2023 at 2:02 pm to Dr. Selina Cooley, who verbally acknowledged these results. Electronically Signed   By: Baldemar Lenis M.D.   On: 12/08/2023 14:03   DG Lumbar Spine 1 View Result Date: 11/20/2023 CLINICAL DATA:  Right L5 laminectomy. EXAM: LUMBAR SPINE - 1 VIEW COMPARISON:  MRI of November 19, 2023. FINDINGS: Single intraoperative cross-table lateral projection was obtained of the lumbar spine. Surgical probe is directed toward posterior elements of L5. IMPRESSION: Surgical localization as described above. Electronically Signed   By: Lupita Raider M.D.   On: 11/20/2023 17:50   ECHOCARDIOGRAM COMPLETE Result Date: 11/19/2023    ECHOCARDIOGRAM REPORT   Patient Name:   Liev Brockbank Sr. Date of Exam: 11/19/2023 Medical Rec #:  [id number]                  Height:       67.0 in Accession #:    [id number]                 Weight:       129.3 lb Date of Birth:  1956/09/05                   BSA:          1.680 m Patient Age:    67 years                   BP:           136/50 mmHg Patient Gender: M                          HR:           53 bpm. Exam Location:  Inpatient Procedure: 2D Echo, Color Doppler and Cardiac Doppler Indications:     murmur  History:        Patient has no prior history of Echocardiogram examinations.                 Risk Factors:Hypertension.  Sonographer:    Melissa Morford RDCS (AE, PE) Referring Phys: [id number] ANAND D HONGALGI IMPRESSIONS  1. Left ventricular ejection fraction, by estimation, is 60 to 65%. The left ventricle has normal function. The left ventricle has no regional wall motion abnormalities. There is moderate concentric left ventricular hypertrophy. Left ventricular diastolic parameters are consistent with Grade II diastolic dysfunction (pseudonormalization).  2. Right ventricular systolic function is normal. The right ventricular size is normal. There is normal pulmonary artery systolic pressure.  3. Left atrial size was mildly dilated.  4. Mild mitral valve regurgitation.  5. Unable to determine if this is a bicupsid valve. AI directed towards the anterior leaflet. The aortic valve is calcified. Aortic valve regurgitation is moderate. Severe aortic valve stenosis. Aortic valve area, by VTI measures 0.85 cm. Aortic valve  mean gradient measures 41.0 mmHg.  6. The inferior vena cava is normal in size with greater than 50% respiratory variability, suggesting right atrial pressure of 3 mmHg. Conclusion(s)/Recommendation(s): Recommend cardiac CT for severe AS. FINDINGS  Left Ventricle: Left ventricular ejection fraction, by estimation, is 60 to 65%. The left ventricle has normal function. The left ventricle has no regional wall motion abnormalities. The left ventricular internal cavity size was normal in size. There is  moderate concentric left ventricular hypertrophy. Left ventricular diastolic parameters are consistent with Grade II diastolic dysfunction (pseudonormalization). Right Ventricle: The right ventricular size is normal. Right ventricular systolic function is normal. There is normal pulmonary artery systolic pressure. The tricuspid regurgitant velocity is 2.32 m/s, and with an assumed right atrial pressure  of 3 mmHg,  the estimated right ventricular systolic pressure is 24.5 mmHg. Left Atrium: Left atrial size was mildly dilated. Right Atrium: Right atrial size was normal in size. Pericardium: There is no evidence of pericardial effusion. Mitral Valve: Mild mitral annular calcification. Mild mitral valve regurgitation. Tricuspid Valve: Tricuspid valve regurgitation is mild. Aortic Valve: Unable to determine if this is a bicupsid valve. AI directed towards the anterior leaflet. The aortic valve is calcified. Aortic valve regurgitation is moderate. Severe aortic stenosis is present. Aortic valve mean gradient measures 41.0 mmHg. Aortic valve peak gradient measures 17.3 mmHg. Aortic valve area, by VTI measures 0.85 cm. Pulmonic Valve: Pulmonic valve regurgitation is trivial. Aorta: The aortic root and ascending aorta are structurally normal, with no evidence of dilitation. Venous: The inferior vena cava is normal in size with greater than 50% respiratory variability, suggesting right atrial pressure of 3 mmHg. IAS/Shunts: No atrial level shunt detected by color flow Doppler.  LEFT VENTRICLE PLAX 2D LVIDd:         4.90 cm      Diastology LVIDs:         3.10 cm      LV e' medial:    6.31 cm/s LV PW:         1.20 cm      LV E/e' medial:  15.4 LV IVS:        1.20 cm      LV e' lateral:   7.07 cm/s LVOT diam:     2.00 cm      LV E/e' lateral: 13.7 LV SV:         89 LV SV Index:   53 LVOT Area:     3.14 cm  LV Volumes (MOD) LV vol d, MOD A2C: 112.0 ml LV vol d, MOD A4C: 123.0 ml LV vol s, MOD A2C: 51.3 ml LV vol s, MOD A4C: 58.6 ml LV SV MOD A2C:     60.7 ml LV SV MOD A4C:     123.0 ml LV SV MOD BP:      62.5 ml RIGHT VENTRICLE RV S prime:     12.40 cm/s TAPSE (M-mode): 2.6 cm LEFT ATRIUM           Index        RIGHT ATRIUM           Index LA diam:      3.60 cm 2.14 cm/m   RA Area:     15.00 cm LA Vol (A4C): 59.3 ml 35.30 ml/m  RA Volume:   41.90 ml  24.94 ml/m  AORTIC VALVE AV Area (Vmax):    1.92 cm AV Area (Vmean):    0.86 cm AV Area (VTI):  0.85 cm AV Vmax:           207.94 cm/s AV Vmean:          300.000 cm/s AV VTI:            1.050 m AV Peak Grad:      17.3 mmHg AV Mean Grad:      41.0 mmHg LVOT Vmax:         127.00 cm/s LVOT Vmean:        81.900 cm/s LVOT VTI:          0.284 m LVOT/AV VTI ratio: 0.27  AORTA Ao Root diam: 3.60 cm Ao Asc diam:  3.40 cm MITRAL VALVE               TRICUSPID VALVE MV Area (PHT): 3.06 cm    TR Peak grad:   21.5 mmHg MV Decel Time: 248 msec    TR Vmax:        232.00 cm/s MV E velocity: 96.90 cm/s MV A velocity: 82.60 cm/s  SHUNTS MV E/A ratio:  1.17        Systemic VTI:  0.28 m                            Systemic Diam: 2.00 cm Carolan Clines Electronically signed by Carolan Clines Signature Date/Time: 11/19/2023/1:17:02 PM    Final    MR Lumbar Spine W Wo Contrast Result Date: 11/19/2023 CLINICAL DATA:  Initial evaluation for compression fracture, known malignancy. EXAM: MRI LUMBAR SPINE WITHOUT AND WITH CONTRAST TECHNIQUE: Multiplanar and multiecho pulse sequences of the lumbar spine were obtained without and with intravenous contrast. CONTRAST:  6mL GADAVIST GADOBUTROL 1 MMOL/ML IV SOLN COMPARISON:  Prior CT from 10/28/2023 and MRI from 09/24/2023. FINDINGS: Segmentation: Standard. Lowest well-formed disc space labeled the L5-S1 level. Alignment: Trace dextroscoliosis. Trace degenerative anterolisthesis of L2 on L3. Vertebrae: Previously identified metastatic lesion involving the posterior aspect of the L5 vertebral body again seen, not significantly changed in size or appearance since previous. Associated pathologic fracture with osseous fragment posterior to the L5 vertebral body, better appreciated on recent CT. Epidural extension with tumor extending into the ventral epidural space, little interval changed. No new metastatic lesions within the lumbar spine. Vertebral body height otherwise maintained. Bone marrow signal intensity otherwise normal. No evidence for osteomyelitis discitis or  septic arthritis. Conus medullaris and cauda equina: Conus extends to the L1 level. Conus and cauda equina appear normal. Paraspinal and other soft tissues: Paraspinous soft tissues demonstrate no acute finding. Multiple scattered T2 hyperintense cyst noted about the visualized kidneys, benign in appearance, no follow-up imaging recommended. Disc levels: L1-2:  Unremarkable. L2-3: Trace anterolisthesis. Disc desiccation with mild disc bulge and reactive endplate spurring. Mild right greater than left facet hypertrophy with small joint effusions. No significant spinal stenosis. Mild bilateral L2 foraminal narrowing. L3-4: Disc desiccation with minimal disc bulge. Superimposed small left foraminal disc protrusion closely approximates the exiting left L3 nerve root (series 8, image 22). Mild facet spurring. No spinal stenosis. Mild bilateral L3 foraminal stenosis. L4-5: Disc desiccation with mild disc bulge. Mild facet and ligament flavum hypertrophy with trace joint effusions. No significant spinal stenosis. Mild bilateral L4 foraminal narrowing. L5-S1: Metastatic lesion with epidural extension of tumor into the ventral epidural space. Superimposed mild bilateral facet hypertrophy. Resultant moderate canal with moderate to severe bilateral subarticular stenosis. Mild left with moderate right L5 foraminal narrowing. Overall appearance is similar  to previous MRI. IMPRESSION: 1. No significant interval change in size or appearance of known metastatic lesion involving the posterior aspect of the L5 vertebral body. Associated pathologic fracture fragment better appreciated on recent CT. Epidural extension of tumor into the ventral epidural space with resultant moderate canal with moderate to severe bilateral subarticular stenosis. 2. No new metastatic disease within the lumbar spine. 3. Mild spondylosis elsewhere within the lumbar spine as above. No other significant spinal stenosis. Mild bilateral L2 through L4 foraminal  narrowing, with mild left and moderate right L5 foraminal stenosis. Electronically Signed   By: Rise Mu M.D.   On: 11/19/2023 05:15   (Echo, Carotid, EGD, Colonoscopy, ERCP)    Subjective: Patient seen and examined.  Wife at the bedside.  Eager to be discharged.  Currently denies any complaints.  Voice is more clear now.  Mobilized around without trouble.   Discharge Exam: Vitals:   12/09/23 1126 12/09/23 1130  BP: (!) 147/59 (!) 152/56  Pulse:  82  Resp:  20  Temp: 98.1 F (36.7 C)   SpO2:  100%   Vitals:   12/09/23 1100 12/09/23 1115 12/09/23 1126 12/09/23 1130  BP: (!) 141/55 (!) 161/55 (!) 147/59 (!) 152/56  Pulse: 74 78  82  Resp: 20 20  20   Temp:   98.1 F (36.7 C)   TempSrc:   Oral   SpO2: 100% 100%  100%  Weight:      Height:        General: Pt is alert, awake, not in acute distress, thinly  built.  Cardiovascular: RRR, S1/S2 +, no rubs, no gallops Respiratory: CTA bilaterally, no wheezing, no rhonchi Abdominal: Soft, NT, ND, bowel sounds + Extremities: no edema, no cyanosis    The results of significant diagnostics from this hospitalization (including imaging, microbiology, ancillary and laboratory) are listed below for reference.     Microbiology: No results found for this or any previous visit (from the past 240 hours).   Labs: BNP (last 3 results) No results for input(s): "BNP" in the last 8760 hours. Basic Metabolic Panel: Recent Labs  Lab 12/08/23 1342 12/08/23 1346 12/08/23 2215  NA 140 142  --   K 4.3 4.3  --   CL 106 108  --   CO2 23  --   --   GLUCOSE 124* 119*  --   BUN 28* 32*  --   CREATININE 1.41* 1.40* 1.24  CALCIUM 10.1  --   --    Liver Function Tests: Recent Labs  Lab 12/08/23 1342  AST 19  ALT 16  ALKPHOS 61  BILITOT 0.6  PROT 7.6  ALBUMIN 4.0   No results for input(s): "LIPASE", "AMYLASE" in the last 168 hours. No results for input(s): "AMMONIA" in the last 168 hours. CBC: Recent Labs  Lab  12/08/23 1342 12/08/23 1346 12/08/23 2215  WBC 8.9  --  5.3  NEUTROABS 7.2  --   --   HGB 10.6* 11.6* 9.8*  HCT 32.3* 34.0* 29.9*  MCV 99.7  --  98.7  PLT 227  --  212   Cardiac Enzymes: No results for input(s): "CKTOTAL", "CKMB", "CKMBINDEX", "TROPONINI" in the last 168 hours. BNP: Invalid input(s): "POCBNP" CBG: Recent Labs  Lab 12/08/23 1340  GLUCAP 118*   D-Dimer No results for input(s): "DDIMER" in the last 72 hours. Hgb A1c Recent Labs    12/08/23 2215  HGBA1C 5.4   Lipid Profile Recent Labs    12/09/23 0633  CHOL  177  HDL 45  LDLCALC 123*  TRIG 47  CHOLHDL 3.9   Thyroid function studies Recent Labs    12/09/23 0633  TSH 0.818   Anemia work up No results for input(s): "VITAMINB12", "FOLATE", "FERRITIN", "TIBC", "IRON", "RETICCTPCT" in the last 72 hours. Urinalysis No results found for: "COLORURINE", "APPEARANCEUR", "LABSPEC", "PHURINE", "GLUCOSEU", "HGBUR", "BILIRUBINUR", "KETONESUR", "PROTEINUR", "UROBILINOGEN", "NITRITE", "LEUKOCYTESUR" Sepsis Labs Recent Labs  Lab 12/08/23 1342 12/08/23 2215  WBC 8.9 5.3   Microbiology No results found for this or any previous visit (from the past 240 hours).   Time coordinating discharge:  32 minutes  SIGNED:   Dorcas Carrow, MD  [facility] 12/09/2023, 4:27 PM

## 2023-12-10 ENCOUNTER — Ambulatory Visit: Payer: Commercial Managed Care - PPO

## 2023-12-11 ENCOUNTER — Telehealth: Payer: Self-pay | Admitting: Radiation Oncology

## 2023-12-11 ENCOUNTER — Ambulatory Visit: Payer: Commercial Managed Care - PPO

## 2023-12-11 DIAGNOSIS — C220 Liver cell carcinoma: Secondary | ICD-10-CM | POA: Diagnosis not present

## 2023-12-11 NOTE — Telephone Encounter (Signed)
1/30 Received fax copy from [employer] claim.  Place in orange folder for [employer] to pick up.

## 2023-12-12 ENCOUNTER — Ambulatory Visit: Payer: Commercial Managed Care - PPO

## 2023-12-15 ENCOUNTER — Other Ambulatory Visit: Payer: Self-pay

## 2023-12-15 ENCOUNTER — Ambulatory Visit
Admission: RE | Admit: 2023-12-15 | Discharge: 2023-12-15 | Disposition: A | Discharge status: Home / Self Care | Payer: Commercial Managed Care - PPO | Source: Ambulatory Visit | Attending: Radiation Oncology

## 2023-12-15 DIAGNOSIS — C220 Liver cell carcinoma: Secondary | ICD-10-CM | POA: Insufficient documentation

## 2023-12-15 DIAGNOSIS — C7951 Secondary malignant neoplasm of bone: Secondary | ICD-10-CM | POA: Insufficient documentation

## 2023-12-15 DIAGNOSIS — I10 Essential (primary) hypertension: Secondary | ICD-10-CM | POA: Diagnosis not present

## 2023-12-15 DIAGNOSIS — N200 Calculus of kidney: Secondary | ICD-10-CM | POA: Diagnosis not present

## 2023-12-15 DIAGNOSIS — G459 Transient cerebral ischemic attack, unspecified: Secondary | ICD-10-CM | POA: Diagnosis not present

## 2023-12-15 DIAGNOSIS — I700 Atherosclerosis of aorta: Secondary | ICD-10-CM | POA: Diagnosis not present

## 2023-12-15 DIAGNOSIS — D649 Anemia, unspecified: Secondary | ICD-10-CM | POA: Diagnosis not present

## 2023-12-15 DIAGNOSIS — Z79899 Other long term (current) drug therapy: Secondary | ICD-10-CM | POA: Diagnosis not present

## 2023-12-15 DIAGNOSIS — R911 Solitary pulmonary nodule: Secondary | ICD-10-CM | POA: Diagnosis not present

## 2023-12-15 DIAGNOSIS — N281 Cyst of kidney, acquired: Secondary | ICD-10-CM | POA: Diagnosis not present

## 2023-12-15 DIAGNOSIS — I2510 Atherosclerotic heart disease of native coronary artery without angina pectoris: Secondary | ICD-10-CM | POA: Diagnosis not present

## 2023-12-15 DIAGNOSIS — R011 Cardiac murmur, unspecified: Secondary | ICD-10-CM | POA: Diagnosis not present

## 2023-12-15 DIAGNOSIS — K7460 Unspecified cirrhosis of liver: Secondary | ICD-10-CM | POA: Diagnosis not present

## 2023-12-15 DIAGNOSIS — Z510 Encounter for antineoplastic radiation therapy: Secondary | ICD-10-CM | POA: Insufficient documentation

## 2023-12-15 DIAGNOSIS — Z7902 Long term (current) use of antithrombotics/antiplatelets: Secondary | ICD-10-CM | POA: Diagnosis not present

## 2023-12-15 DIAGNOSIS — Z5112 Encounter for antineoplastic immunotherapy: Secondary | ICD-10-CM | POA: Diagnosis not present

## 2023-12-15 LAB — RAD ONC ARIA SESSION SUMMARY
Course Elapsed Days: 0
Plan Fractions Treated to Date: 1
Plan Prescribed Dose Per Fraction: 7 Gy
Plan Total Fractions Prescribed: 5
Plan Total Prescribed Dose: 35 Gy
Reference Point Dosage Given to Date: 7 Gy
Reference Point Session Dosage Given: 7 Gy
Session Number: 1

## 2023-12-15 NOTE — Progress Notes (Signed)
 Palliative Medicine [facility]  [phone] Fax:([phone]) [phone]   Name: Corey Regnier Sr. Date: 12/15/2023 MRN: [id number]  DOB: [birth year]  Patient Care Team: [name], MD as PCP - General (Internal Medicine) [name], MD as PCP - Cardiology (Cardiology) [name], MD as Attending Physician (Hematology and Oncology) [name], [name], NP as Nurse Practitioner (Hospice and Palliative Medicine)   INTERVAL HISTORY: Corey Villamizar Sr. is a 68 y.o. 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  Palliative ask to see for symptom and pain management and goals of care.   SOCIAL HISTORY:     reports that he has quit smoking. He has been exposed to tobacco smoke. He has never used smokeless tobacco. He reports that he does not currently use alcohol. He reports that he does not use drugs.  ADVANCE DIRECTIVES:  None on file  CODE STATUS: Full code  PAST MEDICAL HISTORY: Past Medical History:  Diagnosis Date   Headache    migraines   Hepatitis    HX OF hEP c ?    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    ALLERGIES:  has no known allergies.  MEDICATIONS:  Current Outpatient Medications  Medication Sig Dispense Refill   acetaminophen  (TYLENOL ) 500 MG tablet Take 1,000 mg by mouth every 6 (six) hours as needed.     aspirin  EC 81 MG tablet Take 1 tablet (81 mg total) by mouth daily. Swallow whole. 30 tablet 12   atorvastatin  (LIPITOR) 40 MG tablet Take 1 tablet (40 mg total) by mouth daily. 30 tablet 11   clopidogrel  (PLAVIX ) 75 MG tablet Take 1 tablet (75 mg total) by mouth daily for 21 days. 21 tablet 0   No current facility-administered medications for this visit.    VITAL SIGNS: There were no vitals taken for this visit. There were no vitals filed for this visit.  Estimated body mass index is  20.36 kg/m as calculated from the following:   Height as of 12/08/23: 5' 7 (1.702 m).   Weight as of 12/08/23: 130 lb (59 kg).   PERFORMANCE STATUS (ECOG) : 1 - Symptomatic but completely ambulatory  Assessment NAD, resting in recliner RRR Normal breathing pattern Alert and oriented x3  Discussed the use of AI scribe software for clinical note transcription with the patient, who gave verbal consent to proceed.   IMPRESSION:  I saw Mr. Corey Armstrong during his infusion. No acute distress. He is doing well overall compared to previous months. Patient is ambulating without assistance which is an improvement for him. Denies nausea, vomiting, constipation, or diarrhea. His ex-wife [name] is present. They express happiness in his health and overall condition.   He is managing his pain well and has not been taking pain medication regularly. He uses Tylenol  occasionally.   His appetite is good, and he is careful not to overexert himself. He previously desired to return to work but now takes a more measured approach, listening to his body and not rushing. We discussed continuing to take things one day at a time.   No symptom management needs at this time. Patient knows we are available as needed.  Goals of care   8/28- We discussed his current illness and what it means in the larger context of his on-going co-morbidities. Natural disease trajectory and expectations were discussed.   He is realistic in  his understanding of his cancer diagnosis and current treatment plan.  [name] is clear and his expressed wishes to continue to treat the treatable allow him every opportunity to continue to thrive while managing his symptoms alone for a decent quality of life.  We discussed Her current illness and what it means in the larger context of Her on-going co-morbidities. Natural disease trajectory and expectations were discussed.  I discussed the importance of continued conversation with family  and their medical providers regarding overall plan of care and treatment options, ensuring decisions are within the context of the patients values and GOCs.  Assessment and Plan  Pain Management Patient reports intermittent pain, managed with Tylenol  as needed. No use of other pain medications. Have been able to wean and discontinue them all.  -Continue current pain management strategy with Tylenol  as needed. -Will discontinue medications from regimen as he is no longer requiring use.  General Health Maintenance Patient is advised to listen to his body and not overdo activities. -Continue current lifestyle modifications and follow-up as needed. -I will plan to see patient back in clinic in 6-8 weeks. Sooner if needed.  Patient expressed understanding and was in agreement with this plan. He also understands that He can call the clinic at any time with any questions, concerns, or complaints.   Any controlled substances utilized were prescribed in the context of palliative care. PDMP has been reviewed.   Visit consisted of counseling and education dealing with the complex and emotionally intense issues of symptom management and palliative care in the setting of serious and potentially life-threatening illness.  Levon Borer, AGPCNP-BC  Palliative Medicine Team/[facility]

## 2023-12-16 ENCOUNTER — Ambulatory Visit: Payer: Commercial Managed Care - PPO | Attending: Neurological Surgery

## 2023-12-16 ENCOUNTER — Ambulatory Visit: Payer: Commercial Managed Care - PPO

## 2023-12-16 DIAGNOSIS — R262 Difficulty in walking, not elsewhere classified: Secondary | ICD-10-CM | POA: Diagnosis present

## 2023-12-16 DIAGNOSIS — M79661 Pain in right lower leg: Secondary | ICD-10-CM | POA: Insufficient documentation

## 2023-12-16 DIAGNOSIS — M6281 Muscle weakness (generalized): Secondary | ICD-10-CM | POA: Insufficient documentation

## 2023-12-16 NOTE — Therapy (Signed)
 OUTPATIENT PHYSICAL THERAPY TREATMENT   Patient Name: Corey Peixoto Sr. MRN: [id number] DOB:1956/08/21, 68 y.o., male Today's Date: 12/16/2023  END OF SESSION:  Corey Armstrong End of Session - 12/16/23 0849     Visit Number 2    Number of Visits 20    Date for Corey Armstrong Re-Evaluation 02/13/24    Authorization Type UMR    Corey Armstrong Start Time 0847    Corey Armstrong Stop Time 0928    Corey Armstrong Time Calculation (min) 41 min    Activity Tolerance Patient tolerated treatment well    Behavior During Therapy [facility] for tasks assessed/performed              Past Medical History:  Diagnosis Date   Headache    migraines   Hepatitis    HX OF hEP c ?    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   Past Surgical History:  Procedure Laterality Date   DECOMPRESSIVE LUMBAR LAMINECTOMY LEVEL 1 N/A 11/20/2023   Procedure: lumbar laminectomy right Lumbar five with decompression of nerve roots and excision of tumor;  Surgeon: Colon Shove, MD;  Location: [facility] OR;  Service: Neurosurgery;  Laterality: N/A;   IR RADIOLOGIST EVAL & MGMT  01/25/2020   IR RADIOLOGIST EVAL & MGMT  03/21/2020   IR RADIOLOGIST EVAL & MGMT  06/07/2020   IR RADIOLOGIST EVAL & MGMT  09/05/2020   IR RADIOLOGIST EVAL & MGMT  12/05/2020   IR RADIOLOGIST EVAL & MGMT  05/09/2021   IR RADIOLOGIST EVAL & MGMT  09/06/2021   IR RADIOLOGIST EVAL & MGMT  12/12/2021   IR RADIOLOGIST EVAL & MGMT  06/25/2022   IR RADIOLOGIST EVAL & MGMT  07/24/2022   RADIOLOGY WITH ANESTHESIA N/A 03/01/2020   Procedure: CT WITH ANESTHESIA  THERMAL MICROWAVE  ABLATIION;  Surgeon: Karalee Beat, MD;  Location: [facility] ORS;  Service: Anesthesiology;  Laterality: N/A;   RADIOLOGY WITH ANESTHESIA N/A 07/17/2022   Procedure: CT MICROWAVE ABLATION;  Surgeon: Karalee Beat POUR, MD;  Location: [facility] ORS;  Service: Radiology;  Laterality: N/A;   Patient Active Problem List   Diagnosis Date Noted   TIA (transient ischemic attack) 12/08/2023   Postural dizziness with  presyncope 12/08/2023   Metastatic cancer to spine (HCC) 12/05/2023   Severe aortic stenosis 11/19/2023   Chronic pain syndrome 11/18/2023   Chronic nausea 11/18/2023   Vertebral compression fracture (HCC) 11/18/2023   Acute kidney injury superimposed on chronic kidney disease (HCC) 11/18/2023   Anemia of chronic disease 11/18/2023   Preoperative evaluation to rule out surgical contraindication 11/07/2023   Low back pain radiating to both legs 06/27/2023   Metastatic cancer to bone (HCC) 06/12/2023   Essential hypertension 09/12/2022   Heart murmur 09/12/2022   Preventative health care 08/02/2022   Hepatocellular carcinoma in adult [facility]) 03/01/2020    PCP: [name], MD  REFERRING PROVIDER: Colon Shove, MD  REFERRING DIAG: [phone] (ICD-10-CM) - 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 Had to go to the ED a few weeks ago due to dizziness and loss of conciseness. Has been compliant with initial HEP, no adverse effects noted.   EVAL: Corey Armstrong presents to [facility]y [facility] s/p L5 lumbar laminectomy and excision of tumor performed by Dr. Colon on 11/20/2023. He notes that he has been doing pretty well post surgery and has resumed walking with his SPC. Has lower back pain and continued R LE pain/paresthesias but feels like he is getting some return of R ankle DF. Asked about getting an AFO for his R LE. Feels like he can only stand about 15 minutes at present due to increased LBP.    PERTINENT HISTORY:  Current Cancer, lumbar laminectomy, HTN  PAIN:  Are you having pain?  Yes: NPRS scale: 6/10 Worst: 9/10 Pain location: lower back, distal lateral R LE Pain description: sharp, post surgical, burning/numb  Aggravating factors: prolonged standing, bending Relieving factors: positioning, rest, medications  PRECAUTIONS: Back and current cancer  RED FLAGS: None   WEIGHT BEARING RESTRICTIONS: No  FALLS:  Has patient fallen in last 6 months? No  LIVING ENVIRONMENT: Lives with: lives with their spouse Lives in: House/apartment Stairs: No - Ramp access Has following equipment at home: Single point cane, Environmental Consultant - 2 wheeled, Wheelchair (manual), and reacher  OCCUPATION: Set designer  PLOF: Independent  PATIENT GOALS: decrease his back pain, improve his standing tolerance, wants to get back to work  NEXT MD VISIT: 12/05/2023  OBJECTIVE:  Note: Objective measures were completed at Evaluation unless otherwise noted.  DIAGNOSTIC FINDINGS:  See imaging   PATIENT SURVEYS:  FOTO: 51% function; 63% predicted  COGNITION: Overall cognitive status: Within functional limits for tasks assessed     SENSATION: Light touch: Impaired Right L2-4; absent L5  POSTURE: rounded shoulders, forward head, and increased lumbar lordosis  PALPATION: TTP to R calf  LUMBAR ROM: DNT due to spinal precautions  AROM eval  Flexion   Extension   Right lateral flexion   Left lateral flexion   Right rotation   Left rotation    (Blank rows = not tested)  LOWER EXTREMITY ROM:     Passive  Right eval Left eval  Hip flexion    Hip extension    Hip abduction    Hip adduction    Hip internal rotation    Hip external rotation    Knee flexion    Knee extension    Ankle dorsiflexion 0 10  Ankle plantarflexion    Ankle inversion    Ankle eversion     (Blank rows = not tested)  LOWER EXTREMITY MYOTOMES:  MYOTOME LEVEL Right Left  L2 4/5 5/5  L3 4/5  5/5  L4 1/5 5/5  L5 1/5 5/5  S1 3/5 5/5   FUNCTIONAL TESTS:  30 Second Sit to Stand: 7 reps  TREATMENT: [facility] Adult Corey Armstrong Treatment:                                                  Therapeutic Exercise: Supine R ankle DF x 10 with quick tapping Supine PPT x 10 - 5 hold Supine PPT with ball 2x10 Supine pilates ring bridge 2x10 Supine calf stretch with strap 2x30 R Therapeutic Activity: NuStep lvl 5 UE/LE x  5 min while taking subjective for aerobic training STS 2x10 - no UE support for improving transfer ability Gait Training: Ambulated 359ft wth clinic AFO donned on R foot no AD; working on improving cadence and step length on R Ambulated up/down 5 stairs x 4 laps with reciprocal pattern with clinic AFO donned on R LE no AD Eccentric PF with RTB working on improving heel strike for gait  PATIENT EDUCATION:  Education details: continue HEP Person educated: Patient Education method: Programmer, Multimedia, Facilities Manager, and Handouts Education comprehension: verbalized understanding and returned demonstration  HOME EXERCISE PROGRAM: Access Code: [email or web address] URL: [email or web address] Date: 12/03/2023 Prepared by: Alm Kingdom  Exercises - Supine Ankle Pumps  - 8 x daily - 7 x weekly - 20 reps - Seated Calf Stretch with Strap  - 2-3 x daily - 7 x weekly - 3 sets - 2-3 reps - 30 sec hold - Supine Knee Extension Strengthening  - 1-2 x daily - 7 x weekly - 3 sets - 10 reps - Active Straight Leg Raise with Quad Set  - 1-2 x daily - 7 x weekly - 3 sets - 10 reps - Supine Bridge  - 1-2 x daily - 7 x weekly - 3 sets - 10 reps - Sit to Stand  - 1-2 x daily - 7 x weekly - 2 sets - 10 reps  ASSESSMENT:  CLINICAL IMPRESSION: Corey Armstrong was able to complete all prescribed exercises with no adverse effect or increase in fatigue. Exercises today focused on improving core/LE strength and ROM, improving and stabilizing gait with clinic AFO and decreasing need for AD, and improving transfers moving  sit>stand and activity tolerance. No changes to HEP made this session, continues to have R calf tightness as well as decrease in tibialis anterior firing and core strength. Corey Armstrong continues to benefit from skilled Corey Armstrong services, will continue to progress as able per POC.   EVAL: Patient is a 68 y.o. M who was seen today for physical therapy evaluation and treatment for s/p L5 lumbar laminectomy and excision of tumor performed by Dr. Colon on 11/20/2023. Physical findings are consistent with surgery and recovery timeline, Corey Armstrong demonstrates decrease in gait and functional mobility as well as trace contraction of distal R LE. FOTO score shows he is operating well below PLOF for subjective functional ability. Of note, progress may be limited by metastatic cancer diagnosis, current cancer treatment, and need for another possible surgery. Corey Armstrong can assist with functional gait and mobility deficits and hopefully aid in decreasing LBP.   OBJECTIVE IMPAIRMENTS: Abnormal gait, decreased activity tolerance, decreased balance, decreased mobility, difficulty walking, decreased ROM, decreased strength, impaired sensation, improper body mechanics, postural dysfunction, and pain  ACTIVITY LIMITATIONS: carrying, lifting, sitting, standing, squatting, stairs, transfers, and locomotion level  PARTICIPATION LIMITATIONS: meal prep, cleaning, driving, shopping, community activity, occupation, and yard work  PERSONAL FACTORS: Fitness, Time since onset of injury/illness/exacerbation, and 3+ comorbidities: Current Cancer, lumbar laminectomy, HTN  are also affecting patient's functional outcome.   REHAB POTENTIAL: Fair - depending on course of cancer progression/treatment  CLINICAL DECISION MAKING: Unstable/unpredictable  EVALUATION COMPLEXITY: High   GOALS: Goals reviewed with patient? No  SHORT TERM GOALS: Target date: 12/24/2023   Corey Armstrong will be compliant and knowledgeable with initial HEP for improved comfort and  carryover Baseline: initial HEP given  Goal status: INITIAL  2.  Corey Armstrong will self report low back and R LE pain no greater than 6/10 for improved comfort and functional ability Baseline: 9/10 at worst Goal status: INITIAL  LONG TERM GOALS: Target date: 02/13/2024    Corey Armstrong will improve FOTO function score to no less than 63% as proxy for functional improvement Baseline: 51% function Goal status: INITIAL   2.  Corey Armstrong will self report lower back and R LE pain no greater than 3/10 for improved comfort and functional ability Baseline: 9/10 at worst Goal status: INITIAL   3.  Corey Armstrong will increase 30 Second Sit to Stand rep count to no less than 9 reps for improved balance, strength, and functional mobility Baseline: 7 reps  Goal status: INITIAL   4.  Corey Armstrong will be able to ambulate 1071ft with LRAD and use of AFO on R LE for improved safety and decreased fall risk Baseline: unable Goal status: INITIAL  5.  Corey Armstrong will improve standing activity tolerance to at least 60 minutes in order to perform work and home duties/ADLs with improved comfort and function Baseline: 15 minutes Goal status: INITIAL   PLAN:  Corey Armstrong FREQUENCY: 1-2x/week  Corey Armstrong DURATION: 10 weeks  PLANNED INTERVENTIONS: 97164- Corey Armstrong Re-evaluation, 97110-Therapeutic exercises, 97530- Therapeutic activity, 97112- Neuromuscular re-education, 97535- Self Care, 02859- Manual therapy, and 97116- Gait training  PLAN FOR NEXT SESSION: assess HEP response, gait training, core and balance   Alm JAYSON Kingdom, Corey Armstrong 12/16/2023, 9:45 AM

## 2023-12-16 NOTE — Assessment & Plan Note (Addendum)
 diagnosed 11/2019 by MRI for f/u of cirrhosis. S/p microwave ablation on 03/01/20. -MRI 06/01/22 showed suspicion for local recurrence and metastasis at L5. Biopsy of L5 confirmed metastatic HCC. S/p repeat microwave ablation on 07/17/22. -started lenvima 08/16/22 -staging CT CAP and bone scan on 09/19/22 showed: interval enlargement of right hepatic lobe HCC; new retropulsed bony fragment at L5; no other new lesions. His previous CT was in 05/2022 and he did not start Lenvima until 07/2022, so it's hard to evaluate if he is responding to [facility] or not, the progression is likely the nature course of his Corey Armstrong  -Restaging CT scan from January 02, 2023 showed slightly improved previously treated liver lesion, and additional area of concern in the liver which are difficult to compare with previous scan due to different timing of contrast.  No other evidence of disease progression.  Plan to obtain MRI as next scan.  I personally reviewed the CT scan images and discussed the findings with patient -Will continue Lenvima, he is tolerating well overall. -Due to his weight being above 60 kg now, I increased his Lenvima to 12 mg daily in Feb 2024 -repeated liver MRI on 04/23/2023 showed stable treated liver lesion, L5 bone mets has slightly increased in size.  Patient developed worsening low back pain in July 2024, repeated lumbar MRI showed significant disease progression in the L5 bone lesion.  I personally reviewed the scan images and discussed the findings with patient. -I recommend palliative radiation to his L5 lesion for pain control -I recommend changing systemic treatment to immunotherapy, I discussed option of atezolizumab and bevacizumab, durvalumab and Imjudo, nivo and ipi etc. since he previously progressed on oral TKI, I recommend him to proceed with durvalumab and Imjudo. He started on 06/30/23 -the goal of therapy is curative/palliative to prolong life and prevent/improve cancer related symptoms. -he underwent  Bilateral laminotomies L5-S1 with decompression of the spinal canal and the L5 nerve roots epidural tumor by Dr. Danielle Dess On 11/20/2023 -he started SBRT on 12/15/2023

## 2023-12-17 ENCOUNTER — Inpatient Hospital Stay: Payer: Commercial Managed Care - PPO

## 2023-12-17 ENCOUNTER — Inpatient Hospital Stay: Payer: Commercial Managed Care - PPO | Attending: Hematology

## 2023-12-17 ENCOUNTER — Other Ambulatory Visit: Payer: Self-pay

## 2023-12-17 ENCOUNTER — Inpatient Hospital Stay (HOSPITAL_BASED_OUTPATIENT_CLINIC_OR_DEPARTMENT_OTHER): Payer: Commercial Managed Care - PPO | Admitting: Nurse Practitioner

## 2023-12-17 ENCOUNTER — Ambulatory Visit
Admission: RE | Admit: 2023-12-17 | Discharge: 2023-12-17 | Disposition: A | Discharge status: Home / Self Care | Payer: Commercial Managed Care - PPO | Source: Ambulatory Visit | Attending: Radiation Oncology

## 2023-12-17 ENCOUNTER — Encounter: Payer: Self-pay | Admitting: Nurse Practitioner

## 2023-12-17 ENCOUNTER — Inpatient Hospital Stay: Payer: Commercial Managed Care - PPO | Admitting: Hematology

## 2023-12-17 ENCOUNTER — Encounter: Payer: Self-pay | Admitting: Hematology

## 2023-12-17 VITALS — BP 168/53 | HR 73 | Resp 16

## 2023-12-17 VITALS — BP 153/62 | HR 71 | Temp 97.2°F | Resp 16 | Wt 126.5 lb

## 2023-12-17 DIAGNOSIS — Z7902 Long term (current) use of antithrombotics/antiplatelets: Secondary | ICD-10-CM | POA: Insufficient documentation

## 2023-12-17 DIAGNOSIS — I10 Essential (primary) hypertension: Secondary | ICD-10-CM | POA: Insufficient documentation

## 2023-12-17 DIAGNOSIS — D649 Anemia, unspecified: Secondary | ICD-10-CM | POA: Insufficient documentation

## 2023-12-17 DIAGNOSIS — C220 Liver cell carcinoma: Secondary | ICD-10-CM

## 2023-12-17 DIAGNOSIS — I2510 Atherosclerotic heart disease of native coronary artery without angina pectoris: Secondary | ICD-10-CM | POA: Insufficient documentation

## 2023-12-17 DIAGNOSIS — Z510 Encounter for antineoplastic radiation therapy: Secondary | ICD-10-CM | POA: Diagnosis not present

## 2023-12-17 DIAGNOSIS — N281 Cyst of kidney, acquired: Secondary | ICD-10-CM | POA: Insufficient documentation

## 2023-12-17 DIAGNOSIS — C7951 Secondary malignant neoplasm of bone: Secondary | ICD-10-CM

## 2023-12-17 DIAGNOSIS — I700 Atherosclerosis of aorta: Secondary | ICD-10-CM | POA: Insufficient documentation

## 2023-12-17 DIAGNOSIS — Z5112 Encounter for antineoplastic immunotherapy: Secondary | ICD-10-CM | POA: Insufficient documentation

## 2023-12-17 DIAGNOSIS — K7460 Unspecified cirrhosis of liver: Secondary | ICD-10-CM | POA: Insufficient documentation

## 2023-12-17 DIAGNOSIS — N200 Calculus of kidney: Secondary | ICD-10-CM | POA: Insufficient documentation

## 2023-12-17 DIAGNOSIS — G459 Transient cerebral ischemic attack, unspecified: Secondary | ICD-10-CM | POA: Insufficient documentation

## 2023-12-17 DIAGNOSIS — Z515 Encounter for palliative care: Secondary | ICD-10-CM | POA: Diagnosis not present

## 2023-12-17 DIAGNOSIS — R011 Cardiac murmur, unspecified: Secondary | ICD-10-CM | POA: Insufficient documentation

## 2023-12-17 DIAGNOSIS — R911 Solitary pulmonary nodule: Secondary | ICD-10-CM | POA: Insufficient documentation

## 2023-12-17 DIAGNOSIS — Z79899 Other long term (current) drug therapy: Secondary | ICD-10-CM | POA: Insufficient documentation

## 2023-12-17 LAB — CBC WITH DIFFERENTIAL (CANCER CENTER ONLY)
Abs Immature Granulocytes: 0.01 10*3/uL (ref 0.00–0.07)
Basophils Absolute: 0 10*3/uL (ref 0.0–0.1)
Basophils Relative: 1 %
Eosinophils Absolute: 0.2 10*3/uL (ref 0.0–0.5)
Eosinophils Relative: 5 %
HCT: 31.2 % — ABNORMAL LOW (ref 39.0–52.0)
Hemoglobin: 10.3 g/dL — ABNORMAL LOW (ref 13.0–17.0)
Immature Granulocytes: 0 %
Lymphocytes Relative: 29 %
Lymphs Abs: 1.3 10*3/uL (ref 0.7–4.0)
MCH: 32.3 pg (ref 26.0–34.0)
MCHC: 33 g/dL (ref 30.0–36.0)
MCV: 97.8 fL (ref 80.0–100.0)
Monocytes Absolute: 0.5 10*3/uL (ref 0.1–1.0)
Monocytes Relative: 11 %
Neutro Abs: 2.4 10*3/uL (ref 1.7–7.7)
Neutrophils Relative %: 54 %
Platelet Count: 197 10*3/uL (ref 150–400)
RBC: 3.19 MIL/uL — ABNORMAL LOW (ref 4.22–5.81)
RDW: 13.3 % (ref 11.5–15.5)
WBC Count: 4.4 10*3/uL (ref 4.0–10.5)
nRBC: 0 % (ref 0.0–0.2)

## 2023-12-17 LAB — CMP (CANCER CENTER ONLY)
ALT: 15 U/L (ref 0–44)
AST: 17 U/L (ref 15–41)
Albumin: 4.2 g/dL (ref 3.5–5.0)
Alkaline Phosphatase: 69 U/L (ref 38–126)
Anion gap: 6 (ref 5–15)
BUN: 29 mg/dL — ABNORMAL HIGH (ref 8–23)
CO2: 24 mmol/L (ref 22–32)
Calcium: 9.9 mg/dL (ref 8.9–10.3)
Chloride: 113 mmol/L — ABNORMAL HIGH (ref 98–111)
Creatinine: 1.16 mg/dL (ref 0.61–1.24)
GFR, Estimated: >60 mL/min (ref >60)
Glucose, Bld: 90 mg/dL (ref 70–99)
Potassium: 4.3 mmol/L (ref 3.5–5.1)
Sodium: 143 mmol/L (ref 135–145)
Total Bilirubin: 0.3 mg/dL (ref 0.0–1.2)
Total Protein: 7.3 g/dL (ref 6.5–8.1)

## 2023-12-17 LAB — RAD ONC ARIA SESSION SUMMARY
Course Elapsed Days: 2
Plan Fractions Treated to Date: 2
Plan Prescribed Dose Per Fraction: 7 Gy
Plan Total Fractions Prescribed: 5
Plan Total Prescribed Dose: 35 Gy
Reference Point Dosage Given to Date: 14 Gy
Reference Point Session Dosage Given: 7 Gy
Session Number: 2

## 2023-12-17 LAB — TSH: TSH: 1.478 u[IU]/mL (ref 0.350–4.500)

## 2023-12-17 MED ORDER — SODIUM CHLORIDE 0.9 % IV SOLN
1500.0000 mg | Freq: Once | INTRAVENOUS | Status: AC
  Administered 2023-12-17: 1500 mg via INTRAVENOUS
  Filled 2023-12-17: qty 30

## 2023-12-17 MED ORDER — SODIUM CHLORIDE 0.9 % IV SOLN
Freq: Once | INTRAVENOUS | Status: AC
Start: 2023-12-17 — End: 2023-12-17

## 2023-12-17 NOTE — Patient Instructions (Signed)

## 2023-12-17 NOTE — Progress Notes (Signed)
 [facility]   [phone] Fax:(336) [phone]   Clinic Follow up Note   Patient Care Team: [name], MD as PCP - General (Internal Medicine) [name], MD as PCP - Cardiology (Cardiology) [name], MD as Attending Physician (Hematology and Oncology) [name], [name], NP as Nurse Practitioner [facility] and Palliative Medicine)  Date of Service:  12/17/2023  CHIEF COMPLAINT: f/u of HCC  CURRENT THERAPY:  Durvalumab  every 28 days  Oncology History   Hepatocellular carcinoma in adult [facility]) diagnosed 11/2019 by MRI for f/u of cirrhosis. S/p microwave ablation on 03/01/20. -MRI 06/01/22 showed suspicion for local recurrence and metastasis at L5. Biopsy of L5 confirmed metastatic HCC. S/p repeat microwave ablation on 07/17/22. -started lenvima  08/16/22 -staging CT CAP and bone scan on 09/19/22 showed: interval enlargement of right hepatic lobe HCC; new retropulsed bony fragment at L5; no other new lesions. His previous CT was in 05/2022 and he did not start Lenvima  until 07/2022, so it's hard to evaluate if he is responding to Lenvima  or not, the progression is likely the nature course of his [facility]  -Restaging CT scan from January 02, 2023 showed slightly improved previously treated liver lesion, and additional area of concern in the liver which are difficult to compare with previous scan due to different timing of contrast.  No other evidence of disease progression.  Plan to obtain MRI as next scan.  I personally reviewed the CT scan images and discussed the findings with patient -Will continue Lenvima , he is tolerating well overall. -Due to his weight being above 60 kg now, I increased his Lenvima  to 12 mg daily in Feb 2024 -repeated liver MRI on 04/23/2023 showed stable treated liver lesion, L5 bone mets has slightly increased in size.  Patient developed worsening low back pain in July 2024, repeated lumbar MRI showed significant disease progression  in the L5 bone lesion.  I personally reviewed the scan images and discussed the findings with patient. -I recommend palliative radiation to his L5 lesion for pain control -I recommend changing systemic treatment to immunotherapy, I discussed option of atezolizumab and bevacizumab, durvalumab  and Imjudo , nivo and ipi etc. since he previously progressed on oral TKI, I recommend him to proceed with durvalumab  and Imjudo . He started on 06/30/23 -the goal of therapy is curative/palliative to prolong life and prevent/improve cancer related symptoms. -he underwent Bilateral laminotomies L5-S1 with decompression of the spinal canal and the L5 nerve roots epidural tumor by Dr. Colon On 11/20/2023 -he started SBRT on 12/15/2023    Assessment and Plan    Hepatocellular Carcinoma (HCC) 68 year old male with HCC, post-surgery, currently undergoing radiation therapy. Completed two of five sessions, last scheduled for December 25, 2023. Receiving immunotherapy, well-tolerated without adverse effects. Decision to continue immunotherapy based on stable disease radiographically, despite clinical concerns about disease progression. Whole body CT scan planned to assess disease status and determine continuation of immunotherapy. - Continue radiation therapy as scheduled - Proceed with immunotherapy infusion today - Order whole body CT scan in the next 2-3 weeks - Monitor MRI every 3-4 months - Follow-up appointment on January 14, 2024  Back Pain Significant improvement in back pain post-surgery and radiation therapy. Pain localized to the back, not radiating to the leg. Undergoing physical therapy for drop foot, ambulating better with a cane. - Continue physical therapy for drop foot - Monitor back pain and adjust treatment as necessary  Anemia Mild anemia with hemoglobin level of 10.3, improved from 9.8 at surgery. No  blood transfusion needed. - Monitor hemoglobin levels - Ensure adequate hydration and  nutrition  Transient Ischemic Attack (TIA) Experienced TIA last week with confusion and slurred speech, resolved spontaneously. Observed overnight in the hospital, brain scan showed no stroke but narrowing of neck blood vessels. Currently on Plavix  and aspirin  as prescribed by a neurologist. - Continue Plavix  and aspirin  as prescribed - Monitor for any new neurological symptoms  General Health Maintenance Kidney and liver functions within normal limits, improved kidney function (creatinine 1.16). Tumor markers last checked were negative. - Check tumor markers at the next visit  Plan -Lab reviewed, adequate for treatment, will proceed with durvalumab  today and continue every 4 weeks - Follow-up appointment on January 14, 2024 - Review whole body CT scan results at the next visit.         SUMMARY OF ONCOLOGIC HISTORY: Oncology History  Hepatocellular carcinoma in adult [facility])  12/08/2019 Imaging   MR ABDOMEN WWO CONTRAST   IMPRESSION: Two small masses in the right hepatic lobe which have characteristics diagnostic for hepatocellular carcinoma in the setting of cirrhosis. (LI-RADS Category 5: Definitely HCC)   No evidence of abdominal metastatic disease or other significant abnormality.   03/01/2020 Initial Diagnosis   Hepatocellular carcinoma (HCC)   03/01/2020 Procedure   CT GUIDE TISSUE ABLATION     IMPRESSION: Successful percutaneous thermal ablation.   Signed,   [name], MD, RPVI   06/02/2020 Imaging   MR ABDOMEN WWO CONTRAST   IMPRESSION: 1. There has been interval ablation at two adjacent sites of the subcapsular right lobe of the liver, hepatic segment VI. There is no residual contrast enhancement at these adjacent sites. LI-RADS category 5 TR, nonviable. 2. There are scattered, subcentimeter foci of arterial hyperenhancement of the anterior right lobe of the liver, for example adjacent to the ablation site measuring 3 mm and in the medial anterior  right lobe of the liver measuring 6 mm. This larger focus is unchanged compared to prior. Others were not previously seen. These findings are nonspecific and consistent with LI-RADS category 3. Attention on follow-up.   09/02/2020 Imaging   MR ABDOMEN WWO CONTRAST     IMPRESSION: 1. Post radiofrequency ablation along the margin of the RIGHT hemi liver. Heterogeneous appearance of this area on both T1 and T2. No sign of residual enhancement. LR TR nonviable. 2. Very subtle focus of restricted diffusion in the cephalad lateral segment LEFT hepatic lobe measuring 7 mm, corresponding to low signal on image 27 of series 21. Arterial enhancement is not seen in this area and this areas not seen on prior imaging. Not clear whether this represents a new lesion as this area is obscured largely by artifact on most sequences on today's study and on previous imaging evaluations. LR category 3, consider a 3 to six-month follow-up with abdominal MRI. 3. Lesions of concern scattered about the RIGHT hepatic lobe may represent perfusional anomalies as they were not seen on today's study. 4. Stable mild dilation of the biliary tree.   11/27/2020 Imaging   MR ABDOMEN WWO CONTRAST   IMPRESSION: Minimal decrease in size of ablation defect in the peripheral anterior right hepatic lobe. No evidence of locally recurrent hepatocellular carcinoma or other suspicious liver lesions.   No evidence of metastatic disease or other acute findings.   04/03/2021 Imaging   MR ABDOMEN WWO CONTRAST   IMPRESSION: Stable ablation defect in the lateral right hepatic lobe, without evidence of recurrent tumor at this site.   5  mm hypovascular lesion in segment 5 of the right lobe was not definitely seen on previous study, but is too small to characterize. This shows no arterial phase hyperenhancement. Recommend continued follow-up by MRI in 6 months.   No evidence of abdominal metastatic disease.         09/03/2021 Imaging   MR ABDOMEN WWO CONTRAST   IMPRESSION: 1. Side-by-side ablation sites in the right hepatic lobe in segment 6. The more anterior site remains largely cystic in appearance and no findings suspicious recurrent tumor in this area. There is however abnormal contrast enhancement around the larger more posterior and lateral ablation site worrisome for recurrent tumor. 2. Several small foci of early arterial phase enhancement in the liver. The peripheral lesions are likely vascular shunts. The more central lesions are likely dysplastic nodules. Recommend continued surveillance. 3. Stable bilateral renal cysts.   12/07/2021 Imaging   MR ABDOMEN WWO CONTRAST  IMPRESSION: 1. Ablation defects in the right lobe of the liver, similar to the prior study, without definitive evidence to suggest local recurrence of disease or metastatic disease. 2. Multiple tiny arterial phase areas of nodular hyperenhancement scattered throughout the liver, predominantly in the right lobe of the liver, without perceptible signal abnormality or diffusion restriction on other pulse sequences, nonspecific, and likely small benign areas of vascular shunting. Continued attention on follow-up imaging is recommended. 3. Multiple Bosniak class 1 and Bosniak class 2 cysts in the kidneys bilaterally, similar to the prior study, as above. 4. Aortic atherosclerosis.     06/01/2022 Imaging   MR ABDOMEN WWO CONTRAST   IMPRESSION: 1. LR TR equivocal but suspicious findings with respect to previously treated disease in the RIGHT hepatic lobe given nodular enhancement adjacent and anterior superior to the ablation zone as described. 2. Given lack of late arterial phase due to bolus timing a second area of abnormality within the liver is suspicious for additional site of hepatocellular carcinoma showing washout appearance and capsule appearance on current imaging, cardiac pulsation limiting assessment  in the area of concern. 3. Suspect bony metastatic disease in the lower lumbar spine at L5 level. Dedicated spinal imaging may be helpful for further evaluation.     06/18/2022 Imaging   MR Lumbar Spine W Wo Contrast   IMPRESSION: L5 vertebral body lesion is most consistent with a metastasis. There is probable trace ventral epidural extension   07/17/2022 Procedure   CT GUIDE TISSUE ABLATION   IMPRESSION: Successful CT-guided microwave ablation of recurrent hepatocellular carcinoma.   07/17/2022 Procedure   CT BONE TROCAR/NEEDLE BIOPSY SUPERFICIAL   IMPRESSION: Successful CT-guided core biopsy of L5 bone lesion.      07/17/2022 Pathology Results   SURGICAL PATHOLOGY  CASE: [id number]  PATIENT: Corey Armstrong  Surgical Pathology Report   SURGICAL PATHOLOGY  CASE: [id number]  PATIENT: ELLAREE SIERRA  Surgical Pathology Report      Clinical History: Hx of cirrhosis and small HCC with unexpected  suspicious lesion at L5. Rare HCC met to bone vs atypical hemangioma or  met from new primary. (crm)      FINAL MICROSCOPIC DIAGNOSIS:   A. 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 No acute findings within the chest. No suspicious findings for metastatic disease within the chest. 2. Unchanged 3 mm nodule within the medial right upper lobe. No new or suspicious  lung nodules. 3. Suboptimal visualization of the known liver lesions, reflecting lack of IV contrast material. Similar appearance of ablation defect within the lateral right lobe of liver. 4. Left renal calculi. 5. Coronary artery calcifications. 6.  Aortic Atherosclerosis   06/25/2023 Imaging   NM Bone Scan whole body  IMPRESSION: 1. Known L5 metastasis demonstrates low level uptake, mildly increased from previous bone scan. 2.  No new lesions demonstrated.   06/30/2023 -  Chemotherapy   Patient is on Treatment Plan : Hepatocellular Carcinoma Tremelimumab -actl C1 D1 + Durvalumab  q28d       Imaging        Discussed the use of AI scribe software for clinical note transcription with the patient, who gave verbal consent to proceed.  History of Present Illness   A 67 year old patient with a history of Hepatocellular Carcinoma (HCC) presents for a follow-up visit after recent surgery. The patient reports that the surgery went well and has since started radiation therapy. The patient had a fall at home last week, following which he experienced confusion and slurred speech. He was hospitalized overnight for observation and was diagnosed with a Transient Ischemic Attack (TIA). He was started on Plavix  and aspirin . A brain scan revealed no stroke but showed narrowing of the blood vessels in the neck. The patient's speech has since returned to normal.  The patient also reports improvement in his back pain, which no longer radiates to the leg. He denies any new symptoms. He is currently using a cane for mobility and is undergoing physical therapy for drop foot. The patient's caregiver reports that the patient's mobility has improved.         All other systems were reviewed with the patient and are negative.  MEDICAL HISTORY:  Past Medical History:  Diagnosis Date   Headache    migraines   Hepatitis    HX OF hEP c ?    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    SURGICAL HISTORY: Past Surgical History:  Procedure Laterality Date   DECOMPRESSIVE LUMBAR LAMINECTOMY LEVEL 1 N/A 11/20/2023   Procedure: lumbar laminectomy right Lumbar five with decompression of nerve roots and excision of tumor;  Surgeon: Colon Shove, MD;  Location: [facility] OR;  Service: Neurosurgery;  Laterality: N/A;   IR RADIOLOGIST EVAL & MGMT  01/25/2020   IR RADIOLOGIST EVAL & MGMT  03/21/2020   IR RADIOLOGIST EVAL &  MGMT  06/07/2020   IR RADIOLOGIST EVAL & MGMT  09/05/2020   IR RADIOLOGIST EVAL & MGMT  12/05/2020   IR RADIOLOGIST EVAL & MGMT  05/09/2021   IR RADIOLOGIST EVAL & MGMT  09/06/2021   IR RADIOLOGIST EVAL & MGMT  12/12/2021   IR RADIOLOGIST EVAL & MGMT  06/25/2022   IR RADIOLOGIST EVAL & MGMT  07/24/2022   RADIOLOGY WITH ANESTHESIA N/A 03/01/2020   Procedure: CT WITH ANESTHESIA  THERMAL MICROWAVE  ABLATIION;  Surgeon: Karalee Beat, MD;  Location: [facility] ORS;  Service: Anesthesiology;  Laterality: N/A;   RADIOLOGY WITH ANESTHESIA N/A 07/17/2022   Procedure: CT MICROWAVE ABLATION;  Surgeon: Karalee Beat POUR, MD;  Location: [facility] OR[facility];  Service: Radiology;  Laterality: N/A;    I have reviewed the social history and family history with the patient and they are unchanged from previous note.  ALLERGIES:  has no known allergies.  MEDICATIONS:  Current Outpatient Medications  Medication Sig Dispense Refill   acetaminophen  (TYLENOL ) 500 MG tablet Take 1,000 mg  by mouth every 6 (six) hours as needed.     aspirin  EC 81 MG tablet Take 1 tablet (81 mg total) by mouth daily. Swallow whole. 30 tablet 12   atorvastatin  (LIPITOR) 40 MG tablet Take 1 tablet (40 mg total) by mouth daily. 30 tablet 11   clopidogrel  (PLAVIX ) 75 MG tablet Take 1 tablet (75 mg total) by mouth daily for 21 days. 21 tablet 0   No current facility-administered medications for this visit.   Facility-Administered Medications Ordered in Other Visits  Medication Dose Route Frequency Provider Last Rate Last Admin   durvalumab  (IMFINZI ) 1,500 mg in sodium chloride  0.9 % 100 mL chemo infusion  1,500 mg Intravenous Once [name], MD        PHYSICAL EXAMINATION: ECOG PERFORMANCE STATUS: 1 - Symptomatic but completely ambulatory  Vitals:   12/17/23 1026  BP: (!) 153/62  Pulse: 71  Resp: 16  Temp: (!) 97.2 F (36.2 C)  SpO2: 100%   Wt Readings from Last 3 Encounters:  12/17/23 126 lb 8 oz (57.4 kg)  12/08/23 130 lb (59 kg)  12/05/23  129 lb (58.5 kg)     GENERAL:alert, no distress and comfortable SKIN: skin color, texture, turgor are normal, no rashes or significant lesions EYES: normal, Conjunctiva are pink and non-injected, sclera clear NECK: supple, thyroid  normal size, non-tender, without nodularity LYMPH:  no palpable lymphadenopathy in the cervical, axillary  LUNGS: clear to auscultation and percussion with normal breathing effort HEART: regular rate & rhythm and no murmurs and no lower extremity edema ABDOMEN:abdomen soft, non-tender and normal bowel sounds Musculoskeletal:no cyanosis of digits and no clubbing  NEURO: alert & oriented x 3 with fluent speech, no focal motor/sensory deficits    LABORATORY DATA:  I have reviewed the data as listed    Latest Ref Rng & Units 12/17/2023    9:27 AM 12/08/2023   10:15 PM 12/08/2023    1:46 PM  CBC  WBC 4.0 - 10.5 K/uL 4.4  5.3    Hemoglobin 13.0 - 17.0 g/dL 89.6  9.8  88.3   Hematocrit 39.0 - 52.0 % 31.2  29.9  34.0   Platelets 150 - 400 K/uL 197  212          Latest Ref Rng & Units 12/17/2023    9:27 AM 12/08/2023   10:15 PM 12/08/2023    1:46 PM  CMP  Glucose 70 - 99 mg/dL 90   880   BUN 8 - 23 mg/dL 29   32   Creatinine 9.38 - 1.24 mg/dL 8.83  8.75  8.59   Sodium 135 - 145 mmol/L 143   142   Potassium 3.5 - 5.1 mmol/L 4.3   4.3   Chloride 98 - 111 mmol/L 113   108   CO2 22 - 32 mmol/L 24     Calcium  8.9 - 10.3 mg/dL 9.9     Total Protein 6.5 - 8.1 g/dL 7.3     Total Bilirubin 0.0 - 1.2 mg/dL 0.3     Alkaline Phos 38 - 126 U/L 69     AST 15 - 41 U/L 17     ALT 0 - 44 U/L 15         RADIOGRAPHIC STUDIES: I have personally reviewed the radiological images as listed and agreed with the findings in the report. No results found.    Orders Placed This Encounter  Procedures   CT CHEST ABDOMEN PELVIS W CONTRAST    Standing Status:  Future    Expected Date:   01/07/2024    Expiration Date:   12/16/2024    If indicated for the ordered procedure, I  authorize the administration of contrast media per Radiology protocol:   Yes    Does the patient have a contrast media/X-ray dye allergy?:   No    Preferred imaging location?:   [facility]    If indicated for the ordered procedure, I authorize the administration of oral contrast media per Radiology protocol:   Yes   All questions were answered. The patient knows to call the clinic with any problems, questions or concerns. No barriers to learning was detected. The total time spent in the appointment was 30 minutes.     Onita Mattock, MD 12/17/2023

## 2023-12-17 NOTE — Progress Notes (Signed)
 Message sent Corey Armstrong in [place] Scheduling to contact pt or pt's spouse to get pt scheduled for CT Scan.

## 2023-12-18 ENCOUNTER — Ambulatory Visit: Payer: Commercial Managed Care - PPO

## 2023-12-18 DIAGNOSIS — M79661 Pain in right lower leg: Secondary | ICD-10-CM | POA: Diagnosis not present

## 2023-12-18 DIAGNOSIS — R262 Difficulty in walking, not elsewhere classified: Secondary | ICD-10-CM

## 2023-12-18 DIAGNOSIS — M6281 Muscle weakness (generalized): Secondary | ICD-10-CM

## 2023-12-18 NOTE — Therapy (Signed)
 OUTPATIENT PHYSICAL THERAPY TREATMENT   Patient Name: Corey Jividen Sr. MRN: [id number] DOB:Jul 30, 1956, 68 y.o., male Today's Date: 12/18/2023  END OF SESSION:  PT End of Session - 12/18/23 0924     Visit Number 3    Number of Visits 20    Date for PT Re-Evaluation 02/13/24    Authorization Type UMR    PT Start Time 0930    PT Stop Time 1015    PT Time Calculation (min) 45 min    Activity Tolerance Patient tolerated treatment well    Behavior During Therapy WFL for tasks assessed/performed               Past Medical History:  Diagnosis Date   Headache    migraines   Hepatitis    HX OF hEP c ?    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   Past Surgical History:  Procedure Laterality Date   DECOMPRESSIVE LUMBAR LAMINECTOMY LEVEL 1 N/A 11/20/2023   Procedure: lumbar laminectomy right Lumbar five with decompression of nerve roots and excision of tumor;  Surgeon: Colon Shove, MD;  Location: [facility] OR;  Service: Neurosurgery;  Laterality: N/A;   IR RADIOLOGIST EVAL & MGMT  01/25/2020   IR RADIOLOGIST EVAL & MGMT  03/21/2020   IR RADIOLOGIST EVAL & MGMT  06/07/2020   IR RADIOLOGIST EVAL & MGMT  09/05/2020   IR RADIOLOGIST EVAL & MGMT  12/05/2020   IR RADIOLOGIST EVAL & MGMT  05/09/2021   IR RADIOLOGIST EVAL & MGMT  09/06/2021   IR RADIOLOGIST EVAL & MGMT  12/12/2021   IR RADIOLOGIST EVAL & MGMT  06/25/2022   IR RADIOLOGIST EVAL & MGMT  07/24/2022   RADIOLOGY WITH ANESTHESIA N/A 03/01/2020   Procedure: CT WITH ANESTHESIA  THERMAL MICROWAVE  ABLATIION;  Surgeon: Karalee Beat, MD;  Location: [facility] ORS;  Service: Anesthesiology;  Laterality: N/A;   RADIOLOGY WITH ANESTHESIA N/A 07/17/2022   Procedure: CT MICROWAVE ABLATION;  Surgeon: Karalee Beat POUR, MD;  Location: [facility] ORS;  Service: Radiology;  Laterality: N/A;   Patient Active Problem List   Diagnosis Date Noted   TIA (transient ischemic attack) 12/08/2023   Postural dizziness with  presyncope 12/08/2023   Metastatic cancer to spine (HCC) 12/05/2023   Severe aortic stenosis 11/19/2023   Chronic pain syndrome 11/18/2023   Chronic nausea 11/18/2023   Vertebral compression fracture (HCC) 11/18/2023   Acute kidney injury superimposed on chronic kidney disease (HCC) 11/18/2023   Anemia of chronic disease 11/18/2023   Preoperative evaluation to rule out surgical contraindication 11/07/2023   Low back pain radiating to both legs 06/27/2023   Metastatic cancer to bone (HCC) 06/12/2023   Essential hypertension 09/12/2022   Heart murmur 09/12/2022   Preventative health care 08/02/2022   Hepatocellular carcinoma in adult [facility]) 03/01/2020    PCP: [name], MD  REFERRING PROVIDER: Colon Shove, MD  REFERRING DIAG: [phone] (ICD-10-CM) - 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 Has been compliant with HEP.   EVAL: Pt presents to PT s/p L5 lumbar laminectomy and excision of tumor performed by Dr. Colon on 11/20/2023. He notes that he has been doing pretty well post surgery and has resumed walking with his SPC. Has lower back pain and continued R LE pain/paresthesias but feels like he is getting some return of R ankle DF. Asked about getting an AFO for his R LE. Feels like he can only stand about 15 minutes at present due to increased LBP.   PERTINENT HISTORY:  Current Cancer, lumbar laminectomy, HTN  PAIN:  Are you having pain?  Yes: NPRS  scale: 6/10 Worst: 9/10 Pain location: lower back, distal lateral R LE Pain description: sharp, post surgical, burning/numb  Aggravating factors: prolonged standing, bending Relieving factors: positioning, rest, medications  PRECAUTIONS: Back and current cancer  RED FLAGS: None   WEIGHT BEARING RESTRICTIONS: No  FALLS:  Has patient fallen in last 6 months? No  LIVING ENVIRONMENT: Lives with: lives with their spouse Lives in: House/apartment Stairs: No - Ramp access Has following equipment at home: Single point cane, Environmental Consultant - 2 wheeled, Wheelchair (manual), and reacher  OCCUPATION: Set designer  PLOF: Independent  PATIENT GOALS: decrease his back pain, improve his standing tolerance, wants to get back to work  NEXT MD VISIT: 12/05/2023  OBJECTIVE:  Note: Objective measures were completed at Evaluation unless otherwise noted.  DIAGNOSTIC FINDINGS:  See imaging   PATIENT SURVEYS:  FOTO: 51% function; 63% predicted  COGNITION: Overall cognitive status: Within functional limits for tasks assessed     SENSATION: Light touch: Impaired Right L2-4; absent L5  POSTURE: rounded shoulders, forward head, and increased lumbar lordosis  PALPATION: TTP to R calf  LUMBAR ROM: DNT due to spinal precautions  AROM eval  Flexion   Extension   Right lateral flexion   Left lateral flexion   Right rotation   Left rotation    (Blank rows = not tested)  LOWER EXTREMITY ROM:     Passive  Right eval Left eval  Hip flexion    Hip extension    Hip abduction    Hip adduction    Hip internal rotation    Hip external rotation    Knee flexion    Knee extension    Ankle dorsiflexion 0 10  Ankle plantarflexion    Ankle inversion    Ankle eversion     (Blank rows = not tested)  LOWER EXTREMITY MYOTOMES:  MYOTOME LEVEL Right Left  L2 4/5 5/5  L3 4/5 5/5  L4 1/5 5/5  L5 1/5 5/5  S1 3/5 5/5   FUNCTIONAL TESTS:  30 Second Sit to Stand: 7  reps  TREATMENT: [facility] Adult PT Treatment:                                                  Therapeutic Exercise: Slant board calf stretch 2x45  Supine PPT x 10 - 5 hold Supine PPT with ball 2x10 Supine SLR with contralateral push into ball 2x10 Supine pilates ring bridge x 15 Therapeutic Activity: NuStep lvl 5 UE/LE x 5 min while taking subjective for aerobic training Standing mini squat with UE support 2x10 - to improve ability to squat and  pick up objects from floor Step ups 2x10 - 8in step with 1 UE support STS 2x10 - no UE support for improving transfer ability Neuromuscular Re-Education: Wobble board DF/PF with UE support 3x60 Tandem stance 3x30 R back Standing row 2x12 blue band - with focus on scapular setting for increased motor control for posture   PATIENT EDUCATION:  Education details: continue HEP Person educated: Patient Education method: Explanation, Demonstration, and Handouts Education comprehension: verbalized understanding and returned demonstration  HOME EXERCISE PROGRAM: Access Code: [email or web address] URL: [email or web address] Date: 12/03/2023 Prepared by: Alm Kingdom  Exercises - Supine Ankle Pumps  - 8 x daily - 7 x weekly - 20 reps - Seated Calf Stretch with Strap  - 2-3 x daily - 7 x weekly - 3 sets - 2-3 reps - 30 sec hold - Supine Knee Extension Strengthening  - 1-2 x daily - 7 x weekly - 3 sets - 10 reps - Active Straight Leg Raise with Quad Set  - 1-2 x daily - 7 x weekly - 3 sets - 10 reps - Supine Bridge  - 1-2 x daily - 7 x weekly - 3 sets - 10 reps - Sit to Stand  - 1-2 x daily - 7 x weekly - 2 sets - 10 reps  ASSESSMENT:  CLINICAL IMPRESSION: Pt was able to complete all prescribed activities with no adverse effect or increase in LBP. Therapy and exercises today focused on improving core and LE strength and R ankle ROM, R ankle balance and proprioception, postural control and stability, as well as movements to work on improving  functional activities and tolerance. He continues to lack ability to control R ankle dorsiflexors but seems to be improving his ROM and calf length. He continues to benefit from skilled PT services, will continue to progress exercises as tolerated per POC.   EVAL: Patient is a 68 y.o. M who was seen today for physical therapy evaluation and treatment for s/p L5 lumbar laminectomy and excision of tumor performed by Dr. Colon on 11/20/2023. Physical findings are consistent with surgery and recovery timeline, pt demonstrates decrease in gait and functional mobility as well as trace contraction of distal R LE. FOTO score shows he is operating well below PLOF for subjective functional ability. Of note, progress may be limited by metastatic cancer diagnosis, current cancer treatment, and need for another possible surgery. PT can assist with functional gait and mobility deficits and hopefully aid in decreasing LBP.   OBJECTIVE IMPAIRMENTS: Abnormal gait, decreased activity tolerance, decreased balance, decreased mobility, difficulty walking, decreased ROM, decreased strength, impaired sensation, improper body mechanics, postural dysfunction, and pain  ACTIVITY LIMITATIONS: carrying, lifting, sitting, standing, squatting, stairs, transfers, and locomotion level  PARTICIPATION LIMITATIONS: meal prep, cleaning, driving, shopping, community activity, occupation, and yard work  PERSONAL FACTORS: Fitness, Time since onset of injury/illness/exacerbation, and 3+ comorbidities: Current Cancer, lumbar laminectomy, HTN  are also affecting patient's functional outcome.   REHAB POTENTIAL: Fair - depending on course of cancer progression/treatment  CLINICAL DECISION MAKING: Unstable/unpredictable  EVALUATION COMPLEXITY: High   GOALS: Goals reviewed with patient? No  SHORT TERM GOALS: Target date: 12/24/2023   Pt will be compliant and knowledgeable with initial HEP for improved comfort and carryover Baseline:  initial HEP given  Goal status: INITIAL  2.  Pt will self report low back and R LE pain no greater than 6/10 for improved comfort and functional ability Baseline: 9/10 at worst Goal status: INITIAL   LONG TERM GOALS: Target date: 02/13/2024  Pt will improve FOTO function score to no less than 63% as proxy for functional improvement Baseline: 51% function Goal status: INITIAL   2.  Pt will self report lower back and R LE pain no greater than 3/10 for improved comfort and functional ability Baseline: 9/10 at worst Goal status: INITIAL   3.  Pt will increase 30 Second Sit to Stand rep count to no less than 9 reps for improved balance, strength, and functional mobility Baseline: 7 reps  Goal status: INITIAL   4.  Pt will be able to ambulate 1062ft with LRAD and use of AFO on R LE for improved safety and decreased fall risk Baseline: unable Goal status: INITIAL  5.  Pt will improve standing activity tolerance to at least 60 minutes in order to perform work and home duties/ADLs with improved comfort and function Baseline: 15 minutes Goal status: INITIAL   PLAN:  PT FREQUENCY: 1-2x/week  PT DURATION: 10 weeks  PLANNED INTERVENTIONS: 97164- PT Re-evaluation, 97110-Therapeutic exercises, 97530- Therapeutic activity, 97112- Neuromuscular re-education, 97535- Self Care, [id number]- Manual therapy, and 97116- Gait training  PLAN FOR NEXT SESSION: assess HEP response, gait training, core and balance   Alm JAYSON Kingdom, PT 12/18/2023, 10:36 AM

## 2023-12-19 ENCOUNTER — Other Ambulatory Visit: Payer: Self-pay

## 2023-12-19 ENCOUNTER — Ambulatory Visit
Admission: RE | Admit: 2023-12-19 | Discharge: 2023-12-19 | Disposition: A | Discharge status: Home / Self Care | Payer: Commercial Managed Care - PPO | Source: Ambulatory Visit | Attending: Radiation Oncology

## 2023-12-19 DIAGNOSIS — Z510 Encounter for antineoplastic radiation therapy: Secondary | ICD-10-CM | POA: Diagnosis not present

## 2023-12-19 LAB — RAD ONC ARIA SESSION SUMMARY
Course Elapsed Days: 4
Plan Fractions Treated to Date: 3
Plan Prescribed Dose Per Fraction: 7 Gy
Plan Total Fractions Prescribed: 5
Plan Total Prescribed Dose: 35 Gy
Reference Point Dosage Given to Date: 21 Gy
Reference Point Session Dosage Given: 7 Gy
Session Number: 3

## 2023-12-19 LAB — T4: T4, Total: 9 ug/dL (ref 4.5–12.0)

## 2023-12-22 ENCOUNTER — Telehealth: Payer: Self-pay

## 2023-12-22 ENCOUNTER — Ambulatory Visit: Payer: Commercial Managed Care - PPO

## 2023-12-22 DIAGNOSIS — R262 Difficulty in walking, not elsewhere classified: Secondary | ICD-10-CM

## 2023-12-22 DIAGNOSIS — M6281 Muscle weakness (generalized): Secondary | ICD-10-CM

## 2023-12-22 DIAGNOSIS — M79661 Pain in right lower leg: Secondary | ICD-10-CM

## 2023-12-22 NOTE — Therapy (Signed)
 OUTPATIENT PHYSICAL THERAPY TREATMENT   Patient Name: Corey Loehrer Sr. MRN: [id number] DOB:[birth year], 68 y.o., male Today's Date: 12/22/2023  END OF SESSION:  PT End of Session - 12/22/23 0841     Visit Number 4    Number of Visits 20    Date for PT Re-Evaluation 02/13/24    Authorization Type UMR    PT Start Time 0845    PT Stop Time 0928    PT Time Calculation (min) 43 min    Activity Tolerance Patient tolerated treatment well    Behavior During Therapy WFL for tasks assessed/performed                Past Medical History:  Diagnosis Date   Headache    migraines   Hepatitis    HX OF hEP c ?    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   Past Surgical History:  Procedure Laterality Date   DECOMPRESSIVE LUMBAR LAMINECTOMY LEVEL 1 N/A 11/20/2023   Procedure: lumbar laminectomy right Lumbar five with decompression of nerve roots and excision of tumor;  Surgeon: Elna Haggis, MD;  Location: [facility] OR;  Service: Neurosurgery;  Laterality: N/A;   IR RADIOLOGIST EVAL & MGMT  01/25/2020   IR RADIOLOGIST EVAL & MGMT  03/21/2020   IR RADIOLOGIST EVAL & MGMT  06/07/2020   IR RADIOLOGIST EVAL & MGMT  09/05/2020   IR RADIOLOGIST EVAL & MGMT  12/05/2020   IR RADIOLOGIST EVAL & MGMT  05/09/2021   IR RADIOLOGIST EVAL & MGMT  09/06/2021   IR RADIOLOGIST EVAL & MGMT  12/12/2021   IR RADIOLOGIST EVAL & MGMT  06/25/2022   IR RADIOLOGIST EVAL & MGMT  07/24/2022   RADIOLOGY WITH ANESTHESIA N/A 03/01/2020   Procedure: CT WITH ANESTHESIA  THERMAL MICROWAVE  ABLATIION;  Surgeon: Fernando Hoyer, MD;  Location: [facility];  Service: Anesthesiology;  Laterality: N/A;   RADIOLOGY WITH ANESTHESIA N/A 07/17/2022   Procedure: CT MICROWAVE ABLATION;  Surgeon: Roxie Cord, MD;  Location: [facility];  Service: Radiology;  Laterality: N/A;   Patient Active Problem List   Diagnosis Date Noted   TIA (transient ischemic attack) 12/08/2023   Postural dizziness with  presyncope 12/08/2023   Metastatic cancer to spine (HCC) 12/05/2023   Severe aortic stenosis 11/19/2023   Chronic pain syndrome 11/18/2023   Chronic nausea 11/18/2023   Vertebral compression fracture (HCC) 11/18/2023   Acute kidney injury superimposed on chronic kidney disease (HCC) 11/18/2023   Anemia of chronic disease 11/18/2023   Preoperative evaluation to rule out surgical contraindication 11/07/2023   Low back pain radiating to both legs 06/27/2023   Metastatic cancer to bone (HCC) 06/12/2023   Essential hypertension 09/12/2022   Heart murmur 09/12/2022   Preventative health care 08/02/2022   Hepatocellular carcinoma in adult [facility]) 03/01/2020    PCP: [name], MD  REFERRING PROVIDER: Elna Haggis, MD  REFERRING DIAG: [phone] (ICD-10-CM) - 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 Has been compliant with HEP with no adverse effect.   EVAL: Pt presents to PT s/p L5 lumbar laminectomy and excision of tumor performed by Dr. Ellery Guthrie on 11/20/2023. He notes that he has been doing pretty well post surgery and has resumed walking with his SPC. Has lower back pain and continued R LE pain/paresthesias but feels like he is getting some return of R ankle DF. Asked about getting an AFO for his R LE. Feels like he can only stand about 15 minutes at present due to increased LBP.   PERTINENT HISTORY:  Current Cancer, lumbar laminectomy, HTN  PAIN:  Are you having pain?  Yes: NPRS scale: 6/10 Worst:  9/10 Pain location: lower back, distal lateral R LE Pain description: sharp, post surgical, burning/numb  Aggravating factors: prolonged standing, bending Relieving factors: positioning, rest, medications  PRECAUTIONS: Back and current cancer  RED FLAGS: None   WEIGHT BEARING RESTRICTIONS: No  FALLS:  Has patient fallen in last 6 months? No  LIVING ENVIRONMENT: Lives with: lives with their spouse Lives in: House/apartment Stairs: No - Ramp access Has following equipment at home: Single point cane, Environmental consultant - 2 wheeled, Wheelchair (manual), and reacher  OCCUPATION: Set designer  PLOF: Independent  PATIENT GOALS: decrease his back pain, improve his standing tolerance, wants to get back to work  NEXT MD VISIT: 12/05/2023  OBJECTIVE:  Note: Objective measures were completed at Evaluation unless otherwise noted.  DIAGNOSTIC FINDINGS:  See imaging   PATIENT SURVEYS:  FOTO: 51% function; 63% predicted  COGNITION: Overall cognitive status: Within functional limits for tasks assessed     SENSATION: Light touch: Impaired Right L2-4; absent L5  POSTURE: rounded shoulders, forward head, and increased lumbar lordosis  PALPATION: TTP to R calf  LUMBAR ROM: DNT due to spinal precautions  AROM eval  Flexion   Extension   Right lateral flexion   Left lateral flexion   Right rotation   Left rotation    (Blank rows = not tested)  LOWER EXTREMITY ROM:     Passive  Right eval Left eval  Hip flexion    Hip extension    Hip abduction    Hip adduction    Hip internal rotation    Hip external rotation    Knee flexion    Knee extension    Ankle dorsiflexion 0 10  Ankle plantarflexion    Ankle inversion    Ankle eversion     (Blank rows = not tested)  LOWER EXTREMITY MYOTOMES:  MYOTOME LEVEL Right Left  L2 4/5 5/5  L3 4/5 5/5  L4 1/5 5/5  L5 1/5 5/5  S1 3/5 5/5   FUNCTIONAL TESTS:  30 Second Sit to Stand: 7 reps  TREATMENT: [facility] Adult PT  Treatment:                                                  Therapeutic Exercise: Slant board calf stretch 2x45"  Supine PPT x 10 - 5" hold Supine PPT with pilates ring x 10 Supine 90/90 hold 2x30" - added to HEP Supine SLR pilates ring 2x10 each Therapeutic Activity: NuStep lvl 5 UE/LE x 5 min while taking subjective for aerobic training Standing mini squat with UE support 2x10 - to improve ability to squat and pick up  objects from floor Step ups 2x10 - 8in step with no UE support STS x 10 - no UE support for improving transfer ability Neuromuscular Re-Education: Wobble board DF/PF with UE support 3x60" Tandem stance on foam 3x30" R back Side stepping on foam beam x 4 laps at counter - occasional UE support Eccentric calf lowering 2in step - to control foot slap during gait Supine ankle DF 2x10 - quick tapping applied on anterior tibialis tendons  PATIENT EDUCATION:  Education details: continue HEP Person educated: Patient Education method: Explanation, Demonstration, and Handouts Education comprehension: verbalized understanding and returned demonstration  HOME EXERCISE PROGRAM: Access Code: [email or web address] URL: [email or web address] Date: 12/22/2023 Prepared by: Loral Roch  Exercises - Supine Ankle Pumps  - 8 x daily - 7 x weekly - 20 reps - Seated Calf Stretch with Strap  - 2-3 x daily - 7 x weekly - 3 sets - 2-3 reps - 30 sec hold - Supine Knee Extension Strengthening  - 1-2 x daily - 7 x weekly - 3 sets - 10 reps - Active Straight Leg Raise with Quad Set  - 1-2 x daily - 7 x weekly - 3 sets - 10 reps - Supine Bridge  - 1-2 x daily - 7 x weekly - 3 sets - 10 reps - Sit to Stand  - 1-2 x daily - 7 x weekly - 2 sets - 10 reps - Supine 90/90 Abdominal Bracing  - 1 x daily - 7 x weekly - 2 reps - 30 sec hold  ASSESSMENT:  CLINICAL IMPRESSION: Pt was able to complete all prescribed exercises with no adverse effect. Therapy today worked on improving core/LE strength  in order to brace lumbar spine and decrease pain. We also continued to work on functional activities to improve activity tolerance and ADL performance as well as trying to improve R ankle balance, proprioception, and gait initiation. Pt continues to benefit from skilled PT services working on documented deficits. Will continue to progress as able per POC.   EVAL: Patient is a 68 y.o. M who was seen today for physical therapy evaluation and treatment for s/p L5 lumbar laminectomy and excision of tumor performed by Dr. Ellery Guthrie on 11/20/2023. Physical findings are consistent with surgery and recovery timeline, pt demonstrates decrease in gait and functional mobility as well as trace contraction of distal R LE. FOTO score shows he is operating well below PLOF for subjective functional ability. Of note, progress may be limited by metastatic cancer diagnosis, current cancer treatment, and need for another possible surgery. PT can assist with functional gait and mobility deficits and hopefully aid in decreasing LBP.   OBJECTIVE IMPAIRMENTS: Abnormal gait, decreased activity tolerance, decreased balance, decreased mobility, difficulty walking, decreased ROM, decreased strength, impaired sensation, improper body mechanics, postural dysfunction, and pain  ACTIVITY LIMITATIONS: carrying, lifting, sitting, standing, squatting, stairs, transfers, and locomotion level  PARTICIPATION LIMITATIONS: meal prep, cleaning, driving, shopping, community activity, occupation, and yard work  PERSONAL FACTORS: Fitness, Time since onset of injury/illness/exacerbation, and 3+ comorbidities: Current Cancer, lumbar laminectomy, HTN  are also affecting patient's functional outcome.   REHAB POTENTIAL: Fair - depending on course of cancer progression/treatment  CLINICAL DECISION MAKING: Unstable/unpredictable  EVALUATION COMPLEXITY: High   GOALS: Goals reviewed with patient? No  SHORT TERM GOALS: Target date: 12/24/2023   Pt  will be compliant and knowledgeable with initial HEP for improved comfort and carryover Baseline: initial HEP given  Goal status: INITIAL  2.  Pt will self report low back and  R LE pain no greater than 6/10 for improved comfort and functional ability Baseline: 9/10 at worst Goal status: INITIAL   LONG TERM GOALS: Target date: 02/13/2024    Pt will improve FOTO function score to no less than 63% as proxy for functional improvement Baseline: 51% function Goal status: INITIAL   2.  Pt will self report lower back and R LE pain no greater than 3/10 for improved comfort and functional ability Baseline: 9/10 at worst Goal status: INITIAL   3.  Pt will increase 30 Second Sit to Stand rep count to no less than 9 reps for improved balance, strength, and functional mobility Baseline: 7 reps  Goal status: INITIAL   4.  Pt will be able to ambulate 1074ft with LRAD and use of AFO on R LE for improved safety and decreased fall risk Baseline: unable Goal status: INITIAL  5.  Pt will improve standing activity tolerance to at least 60 minutes in order to perform work and home duties/ADLs with improved comfort and function Baseline: 15 minutes Goal status: INITIAL   PLAN:  PT FREQUENCY: 1-2x/week  PT DURATION: 10 weeks  PLANNED INTERVENTIONS: 97164- PT Re-evaluation, 97110-Therapeutic exercises, 97530- Therapeutic activity, 97112- Neuromuscular re-education, 97535- Self Care, 40981- Manual therapy, and 97116- Gait training  PLAN FOR NEXT SESSION: assess HEP response, gait training, core and balance   Ivor Mars, PT 12/22/2023, 9:41 AM

## 2023-12-22 NOTE — Telephone Encounter (Signed)
 Long term disability document request received from [place]  life document have ben completed and faxed back to company. Fax confirmation received. Request for records faxed to [facility]. Copy of documents mailed to patient.

## 2023-12-23 ENCOUNTER — Ambulatory Visit (INDEPENDENT_AMBULATORY_CARE_PROVIDER_SITE_OTHER): Payer: Commercial Managed Care - PPO | Admitting: Family

## 2023-12-23 ENCOUNTER — Encounter (HOSPITAL_BASED_OUTPATIENT_CLINIC_OR_DEPARTMENT_OTHER): Payer: Self-pay | Admitting: Family

## 2023-12-23 ENCOUNTER — Ambulatory Visit: Payer: Commercial Managed Care - PPO

## 2023-12-23 ENCOUNTER — Other Ambulatory Visit: Payer: Self-pay

## 2023-12-23 ENCOUNTER — Ambulatory Visit
Admission: RE | Admit: 2023-12-23 | Discharge: 2023-12-23 | Disposition: A | Discharge status: Home / Self Care | Payer: Commercial Managed Care - PPO | Source: Ambulatory Visit | Attending: Radiation Oncology

## 2023-12-23 VITALS — BP 130/62 | HR 64 | Ht 67.0 in | Wt 132.0 lb

## 2023-12-23 DIAGNOSIS — I350 Nonrheumatic aortic (valve) stenosis: Secondary | ICD-10-CM

## 2023-12-23 DIAGNOSIS — E785 Hyperlipidemia, unspecified: Secondary | ICD-10-CM

## 2023-12-23 DIAGNOSIS — I10 Essential (primary) hypertension: Secondary | ICD-10-CM | POA: Diagnosis not present

## 2023-12-23 DIAGNOSIS — Z8673 Personal history of transient ischemic attack (TIA), and cerebral infarction without residual deficits: Secondary | ICD-10-CM

## 2023-12-23 DIAGNOSIS — Q2381 Bicuspid aortic valve: Secondary | ICD-10-CM

## 2023-12-23 DIAGNOSIS — Z510 Encounter for antineoplastic radiation therapy: Secondary | ICD-10-CM | POA: Diagnosis not present

## 2023-12-23 LAB — RAD ONC ARIA SESSION SUMMARY
Course Elapsed Days: 8
Plan Fractions Treated to Date: 4
Plan Prescribed Dose Per Fraction: 7 Gy
Plan Total Fractions Prescribed: 5
Plan Total Prescribed Dose: 35 Gy
Reference Point Dosage Given to Date: 28 Gy
Reference Point Session Dosage Given: 7 Gy
Session Number: 4

## 2023-12-23 MED ORDER — HYDRALAZINE HCL 10 MG PO TABS: 10.0000 mg | ORAL_TABLET | Freq: Two times a day (BID) | ORAL | 1 refills | Status: DC | PRN

## 2023-12-23 NOTE — Patient Instructions (Signed)
Medication Instructions:  Your physician has recommended you make the following change in your medication:   Start: Hydralazine 10mg  up to twice daily if systolic BP greater than 170   *If you need a refill on your cardiac medications before your next appointment, please call your pharmacy*   Lab Work: Please return for Lab work in 2-3 months for fasting lipid & lft  . You may come to the...   [facility] (3rd floor) [address], [place], [place] [address]  Open: 8am-Noon and 1pm-4:30pm  Please ring the doorbell on the small table when you exit the elevator and the Lab Tech will come get you  [facility] at [facility] [address], [place], [place] [address] Open: 8am-1pm, then 2pm-4:30pm   [facility]- Please see attached locations sheet stapled to your lab work with address and hours.   If you have labs (blood work) drawn today and your tests are completely normal, you will receive your results only by: MyChart Message (if you have MyChart) OR A paper copy in the mail If you have any lab test that is abnormal or we need to change your treatment, we will call you to review the results.  Follow-Up: At [facility], you and your health needs are our priority.  As part of our continuing mission to provide you with exceptional heart care, we have created designated Provider Care Teams.  These Care Teams include your primary Cardiologist (physician) and Advanced Practice Providers (APPs -  Physician Assistants and Nurse Practitioners) who all work together to provide you with the care you need, when you need it.  We recommend signing up for the patient portal called "MyChart".  Sign up information is provided on this After Visit Summary.  MyChart is used to connect with patients for Virtual Visits (Telemedicine).  Patients are able to view lab/test results, encounter notes, upcoming appointments, etc.  Non-urgent messages can be sent  to your provider as well.   To learn more about what you can do with MyChart, go to [email or web address].    Your next appointment:   Follow up in 2-3 months with Dr. Duke Salvia or Gillian Shields, NP   Other Instructions We will follow up in one week to check on blood pressure

## 2023-12-23 NOTE — Telephone Encounter (Signed)
Per patient request copy of documents left with registration for pick up after appointment today

## 2023-12-23 NOTE — Progress Notes (Unsigned)
  Cardiology Office Note:  .   Date:  12/23/2023  ID:  Kathi Der Sr., DOB 26-Apr-1956, MRN [id number] PCP: [name], MD  [facility] Providers Cardiologist:  Chilton Si, MD { Click to update primary MD,subspecialty MD or APP then REFRESH:1}   History of Present Illness: .   Kathi Der Sr. is a 68 y.o. male ***with history of hepatocellular carcinoma with bone mets, anemia, TIA  ROS: Please see the history of present illness.    All other systems reviewed and are negative.   Studies Reviewed: .        *** Risk Assessment/Calculations:             Physical Exam:   VS:  BP (!) 110/58 (BP Location: Left Arm, Patient Position: Sitting, Cuff Size: Normal)   Pulse 64   Ht 5\' 7"  (1.702 m)   Wt 132 lb (59.9 kg)   SpO2 96%   BMI 20.67 kg/m    Wt Readings from Last 3 Encounters:  12/23/23 132 lb (59.9 kg)  12/17/23 126 lb 8 oz (57.4 kg)  12/08/23 130 lb (59 kg)    GEN: Well nourished, well developed in no acute distress NECK: No JVD; No carotid bruits CARDIAC: RRR, no murmurs, rubs, gallops RESPIRATORY:  Clear to auscultation without rales, wheezing or rhonchi  ABDOMEN: Soft, non-tender, non-distended EXTREMITIES:  No edema; No deformity   ASSESSMENT AND PLAN: .    HTN -  Hx of        Dispo: follow up in 2-3 months  Signed, Alver Sorrow, NP

## 2023-12-24 ENCOUNTER — Ambulatory Visit: Payer: Commercial Managed Care - PPO

## 2023-12-24 DIAGNOSIS — M79661 Pain in right lower leg: Secondary | ICD-10-CM | POA: Diagnosis not present

## 2023-12-24 DIAGNOSIS — M6281 Muscle weakness (generalized): Secondary | ICD-10-CM

## 2023-12-24 DIAGNOSIS — R262 Difficulty in walking, not elsewhere classified: Secondary | ICD-10-CM

## 2023-12-24 NOTE — Therapy (Signed)
 OUTPATIENT PHYSICAL THERAPY TREATMENT/DISCHARGE  PHYSICAL THERAPY DISCHARGE SUMMARY  Visits from Start of Care: 5  Current functional level related to goals / functional outcomes: See goals and objective   Remaining deficits: See goals and objective   Education / Equipment: HEP   Patient agrees to discharge. Patient goals were partially met. Patient is being discharged due to financial reasons.   Patient Name: Corey Armstrong. MRN: [id number] DOB:01-Apr-1956, 68 y.o., male Today's Date: 12/24/2023  END OF SESSION:  PT End of Session - 12/24/23 0841     Visit Number 5    Number of Visits 20    Date for PT Re-Evaluation 02/13/24    Authorization Type UMR    PT Start Time 0845    PT Stop Time 0925    PT Time Calculation (min) 40 min    Activity Tolerance Patient tolerated treatment well    Behavior During Therapy WFL for tasks assessed/performed                 Past Medical History:  Diagnosis Date   Headache    migraines   Hepatitis    HX OF hEP c ?    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   Past Surgical History:  Procedure Laterality Date   DECOMPRESSIVE LUMBAR LAMINECTOMY LEVEL 1 N/A 11/20/2023   Procedure: lumbar laminectomy right Lumbar five with decompression of nerve roots and excision of tumor;  Surgeon: [name], MD;  Location: [facility] OR;  Service: Neurosurgery;  Laterality: N/A;   IR RADIOLOGIST EVAL & MGMT  01/25/2020   IR RADIOLOGIST EVAL & MGMT  03/21/2020   IR RADIOLOGIST EVAL & MGMT  06/07/2020   IR RADIOLOGIST EVAL & MGMT  09/05/2020   IR RADIOLOGIST EVAL & MGMT  12/05/2020   IR RADIOLOGIST EVAL & MGMT  05/09/2021   IR RADIOLOGIST EVAL & MGMT  09/06/2021   IR RADIOLOGIST EVAL & MGMT  12/12/2021   IR RADIOLOGIST EVAL & MGMT  06/25/2022   IR RADIOLOGIST EVAL & MGMT  07/24/2022   RADIOLOGY WITH ANESTHESIA N/A 03/01/2020   Procedure: CT WITH ANESTHESIA  THERMAL MICROWAVE  ABLATIION;  Surgeon: [name], MD;  Location: [facility] ORS;  Service: Anesthesiology;  Laterality: N/A;   RADIOLOGY WITH ANESTHESIA N/A 07/17/2022   Procedure: CT MICROWAVE ABLATION;  Surgeon: [name], MD;  Location: [facility] ORS;  Service: Radiology;  Laterality: N/A;   Patient Active Problem List   Diagnosis Date Noted   TIA (transient ischemic attack) 12/08/2023   Postural dizziness with presyncope 12/08/2023   Metastatic cancer to spine (HCC) 12/05/2023   Severe aortic stenosis 11/19/2023   Chronic pain syndrome 11/18/2023   Chronic nausea 11/18/2023   Vertebral compression fracture (HCC) 11/18/2023   Acute kidney injury superimposed on chronic kidney disease (HCC) 11/18/2023   Anemia of chronic disease 11/18/2023   Preoperative evaluation to rule out surgical contraindication 11/07/2023   Low back pain radiating to both legs 06/27/2023   Metastatic cancer to bone (HCC) 06/12/2023   Essential hypertension 09/12/2022   Heart murmur 09/12/2022   Preventative health care 08/02/2022   Hepatocellular carcinoma in adult [facility]) 03/01/2020    PCP: [name], MD  REFERRING PROVIDER: [name], MD  REFERRING DIAG: [phone] (ICD-10-CM) - 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 He asks about pausing PT treatment right now due to financial constraints. Has continued HEP compliance with no adverse effect.   EVAL: Pt presents to PT s/p L5 lumbar laminectomy and excision of tumor performed by Dr. Danielle Dess  on 11/20/2023. He notes that he has been doing pretty well post surgery and has resumed walking with his SPC. Has lower back pain and continued R LE pain/paresthesias but feels like he is getting some return of R ankle DF. Asked about getting an AFO for his R LE. Feels like he can only stand about 15 minutes at present due to increased LBP.   PERTINENT HISTORY:  Current Cancer, lumbar laminectomy, HTN  PAIN:  Are you having pain?  Yes: NPRS scale: 6/10 Worst: 9/10 Pain location: lower back, distal lateral R LE Pain description: sharp, post surgical, burning/numb  Aggravating factors: prolonged standing, bending Relieving factors: positioning, rest, medications  PRECAUTIONS: Back and current cancer  RED FLAGS: None   WEIGHT BEARING RESTRICTIONS: No  FALLS:  Has patient fallen in last 6 months? No  LIVING ENVIRONMENT: Lives with: lives with their spouse Lives in: House/apartment Stairs: No - Ramp access Has following equipment at home: Single point cane, Environmental consultant - 2 wheeled, Wheelchair (manual), and reacher  OCCUPATION: Set designer  PLOF: Independent  PATIENT GOALS: decrease his back pain, improve his standing tolerance, wants to get back to work  NEXT MD VISIT: 12/05/2023  OBJECTIVE:  Note: Objective measures were completed at Evaluation unless otherwise noted.  DIAGNOSTIC FINDINGS:  See imaging   PATIENT SURVEYS:  FOTO: 51% function; 63% predicted  COGNITION: Overall cognitive status: Within functional limits for tasks assessed     SENSATION: Light touch: Impaired Right L2-4; absent L5  POSTURE: rounded shoulders, forward head, and increased lumbar lordosis  PALPATION: TTP to R calf  LUMBAR ROM: DNT due to spinal precautions  AROM eval  Flexion   Extension   Right lateral flexion   Left lateral flexion   Right rotation   Left rotation    (Blank rows = not tested)  LOWER EXTREMITY ROM:     Passive  Right eval Left eval  Hip flexion     Hip extension    Hip abduction    Hip  adduction    Hip internal rotation    Hip external rotation    Knee flexion    Knee extension    Ankle dorsiflexion 0 10  Ankle plantarflexion    Ankle inversion    Ankle eversion     (Blank rows = not tested)  LOWER EXTREMITY MYOTOMES:  MYOTOME LEVEL Right Left  L2 4/5 5/5  L3 4/5 5/5  L4 1/5 5/5  L5 1/5 5/5  S1 3/5 5/5   FUNCTIONAL TESTS:  30 Second Sit to Stand: 7 reps  12/24/2023: 8 reps no UE  TREATMENT: [facility] Adult PT Treatment:                                                  Therapeutic Exercise: Supine SLR 2x15 each Bridge with blue band 2x10 Supine clamshell 2x10 blue band Supine PPT x 10 - 5" hold Supine 90/90 hold 2x30" Seated calf stretch with strap x 30" R Heel toe raises 2x15 Standing gastroc stretch x 30"  Therapeutic Activity: NuStep lvl 5 UE/LE x 5 min while taking subjective for aerobic training STS 2x10 - no UE support for improving transfer ability Assessment of tests/measures, goals, and outcomes for discharge   PATIENT EDUCATION:  Education details: continue HEP Person educated: Patient Education method: Explanation, Demonstration, and Handouts Education comprehension: verbalized understanding and returned demonstration  HOME EXERCISE PROGRAM: Access Code: [email or web address] URL: [email or web address] Date: 12/24/2023 Prepared by: Edwinna Areola  Exercises - Supine Ankle Pumps  - 1-2 x daily - 7 x weekly - 20 reps - Active Straight Leg Raise with Quad Set  - 1-2 x daily - 7 x weekly - 2-3 sets - 15 reps - Supine Bridge with Resistance Band  - 1-2 x daily - 7 x weekly - 3 sets - 10 reps - blue band hold - Hooklying Clamshell with Resistance  - 1 x daily - 7 x weekly - 3 sets - 10 reps - blue band hold - Supine Posterior Pelvic Tilt  - 1-2 x daily - 7 x weekly - 2 sets - 10 reps - 5 sec hold - Supine 90/90 Abdominal Bracing  - 1-2 x daily - 7 x weekly - 2 reps - 30 sec hold - Seated Calf  Stretch with Strap  - 1-2 x daily - 7 x weekly - 3 sets - 2-3 reps - 30 sec hold - Sit to Stand  - 1-2 x daily - 7 x weekly - 2 sets - 10 reps - Heel Toe Raises with Counter Support  - 1-2 x daily - 7 x weekly - 3 sets - 10 reps - Standing Gastroc Stretch  - 1-2 x daily - 7 x weekly - 2 reps - 30 sec hold  ASSESSMENT:  CLINICAL IMPRESSION: Pt was able to complete all prescribed exercises with no adverse effect. Over the course of PT treatment he has made some improvement in overall functional mobility and strength. No change in FOTO and overall functional activity tolerance, although he is undergoing current cancer treatment. He wants to discharge at this time due to financial constraints. HEP reviewed and pt feels comfortable with current program at this time.   EVAL: Patient is a 68 y.o. M who was seen today for physical therapy evaluation and treatment for s/p L5 lumbar laminectomy and excision of tumor performed by Dr. Danielle Dess on  11/20/2023. Physical findings are consistent with surgery and recovery timeline, pt demonstrates decrease in gait and functional mobility as well as trace contraction of distal R LE. FOTO score shows he is operating well below PLOF for subjective functional ability. Of note, progress may be limited by metastatic cancer diagnosis, current cancer treatment, and need for another possible surgery. PT can assist with functional gait and mobility deficits and hopefully aid in decreasing LBP.   OBJECTIVE IMPAIRMENTS: Abnormal gait, decreased activity tolerance, decreased balance, decreased mobility, difficulty walking, decreased ROM, decreased strength, impaired sensation, improper body mechanics, postural dysfunction, and pain  ACTIVITY LIMITATIONS: carrying, lifting, sitting, standing, squatting, stairs, transfers, and locomotion level  PARTICIPATION LIMITATIONS: meal prep, cleaning, driving, shopping, community activity, occupation, and yard work  PERSONAL FACTORS: Fitness,  Time since onset of injury/illness/exacerbation, and 3+ comorbidities: Current Cancer, lumbar laminectomy, HTN  are also affecting patient's functional outcome.   REHAB POTENTIAL: Fair - depending on course of cancer progression/treatment  CLINICAL DECISION MAKING: Unstable/unpredictable  EVALUATION COMPLEXITY: High   GOALS: Goals reviewed with patient? No  SHORT TERM GOALS: Target date: 12/24/2023   Pt will be compliant and knowledgeable with initial HEP for improved comfort and carryover Baseline: initial HEP given  Goal status: MET  2.  Pt will self report low back and R LE pain no greater than 6/10 for improved comfort and functional ability Baseline: 9/10 at worst 12/24/2023: 6/10 at worst Goal status: MET   LONG TERM GOALS: Target date: 02/13/2024    Pt will improve FOTO function score to no less than 63% as proxy for functional improvement Baseline: 51% function 12/24/2023: 51% function Goal status: NOT MET   2.  Pt will self report lower back and R LE pain no greater than 3/10 for improved comfort and functional ability Baseline: 9/10 at worst Goal status: INITIAL   3.  Pt will increase 30 Second Sit to Stand rep count to no less than 9 reps for improved balance, strength, and functional mobility Baseline: 7 reps  Goal status: INITIAL   4.  Pt will be able to ambulate 1055ft with LRAD and use of AFO on R LE for improved safety and decreased fall risk Baseline: unable Goal status: MET  5.  Pt will improve standing activity tolerance to at least 60 minutes in order to perform work and home duties/ADLs with improved comfort and function Baseline: 15 minutes Goal status: NOT MET   PLAN:  PT FREQUENCY: 1-2x/week  PT DURATION: 10 weeks  PLANNED INTERVENTIONS: 97164- PT Re-evaluation, 97110-Therapeutic exercises, 97530- Therapeutic activity, 97112- Neuromuscular re-education, 97535- Self Care, 52841- Manual therapy, and 97116- Gait training  PLAN FOR NEXT SESSION:  assess HEP response, gait training, core and balance   Eloy End, PT 12/24/2023, 3:52 PM

## 2023-12-25 ENCOUNTER — Other Ambulatory Visit: Payer: Self-pay | Admitting: Radiation Oncology

## 2023-12-25 ENCOUNTER — Ambulatory Visit
Admission: RE | Admit: 2023-12-25 | Discharge: 2023-12-25 | Disposition: A | Discharge status: Home / Self Care | Payer: Commercial Managed Care - PPO | Source: Ambulatory Visit | Attending: Radiation Oncology | Admitting: Radiation Oncology

## 2023-12-25 ENCOUNTER — Ambulatory Visit: Payer: Commercial Managed Care - PPO

## 2023-12-25 ENCOUNTER — Other Ambulatory Visit: Payer: Self-pay | Admitting: Radiation Therapy

## 2023-12-25 ENCOUNTER — Other Ambulatory Visit: Payer: Self-pay

## 2023-12-25 DIAGNOSIS — C7951 Secondary malignant neoplasm of bone: Secondary | ICD-10-CM

## 2023-12-25 DIAGNOSIS — Z510 Encounter for antineoplastic radiation therapy: Secondary | ICD-10-CM | POA: Diagnosis not present

## 2023-12-25 LAB — RAD ONC ARIA SESSION SUMMARY
Course Elapsed Days: 10
Plan Fractions Treated to Date: 5
Plan Prescribed Dose Per Fraction: 7 Gy
Plan Total Fractions Prescribed: 5
Plan Total Prescribed Dose: 35 Gy
Reference Point Dosage Given to Date: 35 Gy
Reference Point Session Dosage Given: 7 Gy
Session Number: 5

## 2023-12-26 ENCOUNTER — Ambulatory Visit: Payer: Commercial Managed Care - PPO

## 2023-12-26 NOTE — Radiation Completion Notes (Addendum)
  Radiation Oncology         [phone]) [phone] ________________________________  Name: Corey Der Sr. MRN: [id number]  Date: 12/25/2023  DOB: 1956-07-08  Referring Physician: [name], M.D. Date of Service: 2023-12-26 Radiation Oncologist: Corey Armstrong, M.D. 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   Intent: Curative     ==========DELIVERED PLANS==========  First Treatment Date: 2023-12-15 Last Treatment Date: 2023-12-25   Plan Name: Spine_[email or web address] Site: Lumbar Spine Technique: SBRT/SRT-IMRT Mode: Photon Dose Per Fraction: 7 Gy Prescribed Dose (Delivered / Prescribed): 35 Gy / 35 Gy Prescribed Fxs (Delivered / Prescribed): 5 / 5     ==========ON TREATMENT VISIT DATES========== 2023-12-15, 2023-12-17, 2023-12-19, 2023-12-23, 2023-12-25, 2023-12-25   See weekly On Treatment Notes in Epic for details in the Media tab (listed as Progress notes on the On Treatment Visit Dates listed above).  The patient will receive a call in about one month from the radiation oncology department. He will continue follow up with his medical oncologist, Dr. Mosetta Putt and neurosurgeon, Dr. Danielle Dess, as well.  ------------------------------------------------   Corey Dys, MD [facility]  Radiation Oncology Direct Dial: [phone]  Fax: [phone] [email or web address]  Skype  LinkedIn

## 2023-12-29 ENCOUNTER — Ambulatory Visit: Payer: Commercial Managed Care - PPO

## 2023-12-30 ENCOUNTER — Encounter: Payer: Commercial Managed Care - PPO | Admitting: Physical Therapy

## 2023-12-30 ENCOUNTER — Ambulatory Visit: Payer: Commercial Managed Care - PPO

## 2023-12-31 ENCOUNTER — Ambulatory Visit: Payer: Commercial Managed Care - PPO

## 2024-01-01 ENCOUNTER — Ambulatory Visit: Payer: Commercial Managed Care - PPO

## 2024-01-01 ENCOUNTER — Encounter (HOSPITAL_BASED_OUTPATIENT_CLINIC_OR_DEPARTMENT_OTHER): Payer: Self-pay

## 2024-01-01 ENCOUNTER — Encounter: Payer: Commercial Managed Care - PPO | Admitting: Physical Therapy

## 2024-01-01 MED ORDER — OLMESARTAN MEDOXOMIL 20 MG PO TABS: 20.0000 mg | ORAL_TABLET | Freq: Every day | ORAL | 11 refills | Status: AC

## 2024-01-01 NOTE — Telephone Encounter (Signed)
 BP goal <140/90. Recommend start Olmesartan 20mg  daily. Check BP once per day at least an hour after medications. Recommend report BP log in 1 week. No need for repeat labs as upcoming labs with oncology team will suffice.  Alver Sorrow, NP

## 2024-01-01 NOTE — Telephone Encounter (Signed)
BP update as requested  

## 2024-01-02 ENCOUNTER — Ambulatory Visit: Payer: Commercial Managed Care - PPO

## 2024-01-02 ENCOUNTER — Telehealth: Payer: Self-pay | Admitting: *Deleted

## 2024-01-02 NOTE — Telephone Encounter (Signed)
 Previously, Truddie Crumble Poppen Sr[employer]s [employer] form completed and returned 12/22/2023 by other form staff was then faxed to (SW) H.I.M. to process record requests from 06/13/2023 to present.   Request re-faxed to [facility] 12/19/2023 form staff received 12/23/2023 added to Excel.FormTracker yet disregarded due to recent submission.   Today received request from radiation administrative office faxed on 12/29/2023.  Investigated H.I.M. releases.     Connected with [employer] representative Ennis about 12/25/2023 letter from (SW) H.I.M[employer] that [employer] returned with recent request, seemingly unread.  Advised Ennis our (SW) H.I.M[employer] unable to comply due to "request letter missing the legal name and address of requested facility".   Provided name "[facility]" or "[facility]", address of [facility] as [address] [place], [place], [address] and, toll free fax [phone].  Answered Ennis turn-over question to expect up to thirty days from H.I.M. receipt of corrected request and authorization this office will fax to [phone].    No further questions or needs.

## 2024-01-06 ENCOUNTER — Encounter: Payer: Commercial Managed Care - PPO | Admitting: Physical Therapy

## 2024-01-07 ENCOUNTER — Ambulatory Visit (HOSPITAL_COMMUNITY)
Admission: RE | Admit: 2024-01-07 | Discharge: 2024-01-07 | Disposition: A | Discharge status: Home / Self Care | Payer: Commercial Managed Care - PPO | Source: Ambulatory Visit | Attending: Hematology | Admitting: Hematology

## 2024-01-07 DIAGNOSIS — C220 Liver cell carcinoma: Secondary | ICD-10-CM | POA: Diagnosis present

## 2024-01-07 MED ORDER — SODIUM CHLORIDE (PF) 0.9 % IJ SOLN
INTRAMUSCULAR | Status: AC
Start: 2024-01-07 — End: ?
  Filled 2024-01-07: qty 50

## 2024-01-07 MED ORDER — IOHEXOL 300 MG/ML  SOLN
100.0000 mL | Freq: Once | INTRAMUSCULAR | Status: AC | PRN
  Administered 2024-01-07: 100 mL via INTRAVENOUS

## 2024-01-08 ENCOUNTER — Encounter (HOSPITAL_BASED_OUTPATIENT_CLINIC_OR_DEPARTMENT_OTHER): Payer: Self-pay

## 2024-01-08 ENCOUNTER — Encounter: Payer: Commercial Managed Care - PPO | Admitting: Physical Therapy

## 2024-01-08 NOTE — Telephone Encounter (Signed)
 Blood pressure today high but otherwise nearing goal of <140/80.  If he rechecks blood pressure and it is still greater than 170 recommend taking a dose of his as needed hydralazine.  Otherwise continue present dose olmesartan.  Best,  Alver Sorrow, NP

## 2024-01-08 NOTE — Telephone Encounter (Signed)
BP update as requested  

## 2024-01-09 NOTE — Telephone Encounter (Signed)
 Connected with [employer] Medical Records Dept. [phone]) about second fax received today for medical records only from 06/13/2023 to present still addressed to provider with wrong address and includes fax from (SW) H.I.M of reasons unable to comply.   Was this nurse initial request documented?   [name] read legal name, address and correct fax number.  Denies need for a new physician certification statement.  We only need records. Corey Armstrong

## 2024-01-13 NOTE — Progress Notes (Deleted)
 Palliative Medicine [facility]  [phone] [phone]   Name: Corey Dillenburg Sr. Date: 01/13/2024 MRN: [id number]  DOB: [birth year]  Patient Care Team: [name], MD as PCP - General (Internal Medicine) Chilton Si, MD as PCP - Cardiology (Cardiology) Malachy Mood, MD as Attending Physician (Hematology and Oncology) [name], [name], NP as Nurse Practitioner (Hospice and Palliative Medicine)   I connected with Corey Der Sr. on 01/13/24 at 12:30 PM EST by phone and verified that I am speaking with the correct person using two identifiers.   I discussed the limitations, risks, security and privacy concerns of performing an evaluation and management service by telemedicine and the availability of in-person appointments. I also discussed with the patient that there may be a patient responsible charge related to this service. The patient expressed understanding and agreed to proceed.   Other persons participating in the visit and their role in the encounter: n/a   Patient's location: home  Provider's location: [facility]   Chief Complaint: f/u of symptom management   INTERVAL HISTORY: Corey Mera Sr. is a 68 y.o. 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  Palliative ask to see for symptom and pain management and goals of care.   SOCIAL HISTORY:     reports that he has quit smoking. He has been exposed to tobacco smoke. He has never used smokeless tobacco. He reports that he does not currently use alcohol. He reports that he does not use drugs.  ADVANCE DIRECTIVES:  None on file  CODE STATUS: Full code  PAST MEDICAL HISTORY: Past Medical History:  Diagnosis Date   Headache    migraines   Hepatitis    HX OF hEP c ?    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    ALLERGIES:  has no  known allergies.  MEDICATIONS:  Current Outpatient Medications  Medication Sig Dispense Refill   acetaminophen (TYLENOL) 500 MG tablet Take 1,000 mg by mouth every 6 (six) hours as needed.     aspirin EC 81 MG tablet Take 1 tablet (81 mg total) by mouth daily. Swallow whole. 30 tablet 12   atorvastatin (LIPITOR) 40 MG tablet Take 1 tablet (40 mg total) by mouth daily. 30 tablet 11   hydrALAZINE (APRESOLINE) 10 MG tablet Take 1 tablet (10 mg total) by mouth 2 (two) times daily as needed. Take for systolic blood pressure more than 170. 60 tablet 1   olmesartan (BENICAR) 20 MG tablet Take 1 tablet (20 mg total) by mouth daily. 30 tablet 11   No current facility-administered medications for this visit.    VITAL SIGNS: There were no vitals taken for this visit. There were no vitals filed for this visit.  Estimated body mass index is 20.67 kg/m as calculated from the following:   Height as of 12/23/23: 5\' 7"  (1.702 m).   Weight as of 12/23/23: 132 lb (59.9 kg).   PERFORMANCE STATUS (ECOG) : 1 - Symptomatic but completely ambulatory  Assessment NAD, resting in recliner RRR Normal breathing pattern Alert and oriented x3  Discussed the use of AI scribe software for clinical note transcription with the patient, who gave verbal consent to proceed.   IMPRESSION:  I saw Corey Armstrong during his infusion. No acute distress. He is doing well overall compared to previous months. Patient is ambulating without assistance which is  an improvement for him. Denies nausea, vomiting, constipation, or diarrhea. His ex-wife Corey Armstrong is present. They express happiness in his health and overall condition.   He is managing his pain well and has not been taking pain medication regularly. He uses Tylenol occasionally.   His appetite is good, and he is careful not to overexert himself. He previously desired to return to work but now takes a more measured approach, listening to his body and not rushing. We  discussed continuing to take things one day at a time.   No symptom management needs at this time. Patient knows we are available as needed.  Goals of care   8/28- We discussed his current illness and what it means in the larger context of his on-going co-morbidities. Natural disease trajectory and expectations were discussed.   He is realistic in his understanding of his cancer diagnosis and current treatment plan.  Corey Armstrong is clear and his expressed wishes to continue to treat the treatable allow him every opportunity to continue to thrive while managing his symptoms alone for a decent quality of life.  We discussed Her current illness and what it means in the larger context of Her on-going co-morbidities. Natural disease trajectory and expectations were discussed.  I discussed the importance of continued conversation with family and their medical providers regarding overall plan of care and treatment options, ensuring decisions are within the context of the patients values and GOCs.  Assessment and Plan  Pain Management Patient reports intermittent pain, managed with Tylenol as needed. No use of other pain medications. Have been able to wean and discontinue them all.  -Continue current pain management strategy with Tylenol as needed. -Will discontinue medications from regimen as he is no longer requiring use.  General Health Maintenance Patient is advised to listen to his body and not overdo activities. -Continue current lifestyle modifications and follow-up as needed. -I will plan to see patient back in clinic in 6-8 weeks. Sooner if needed.  Patient expressed understanding and was in agreement with this plan. He also understands that He can call the clinic at any time with any questions, concerns, or complaints.   Any controlled substances utilized were prescribed in the context of palliative care. PDMP has been reviewed.   Visit consisted of counseling and education dealing  with the complex and emotionally intense issues of symptom management and palliative care in the setting of serious and potentially life-threatening illness.  [name], AGPCNP-BC  Palliative Medicine Team/[facility]

## 2024-01-13 NOTE — Telephone Encounter (Signed)
 To date no receipt of correct [employer] request for records from Radiation provider.  Request delivered to radiation office to notify of request.

## 2024-01-14 ENCOUNTER — Encounter: Payer: Self-pay | Admitting: Hematology

## 2024-01-14 ENCOUNTER — Inpatient Hospital Stay: Payer: Commercial Managed Care - PPO | Admitting: Nurse Practitioner

## 2024-01-14 ENCOUNTER — Inpatient Hospital Stay: Payer: Commercial Managed Care - PPO

## 2024-01-14 ENCOUNTER — Inpatient Hospital Stay: Payer: Commercial Managed Care - PPO | Attending: Hematology

## 2024-01-14 ENCOUNTER — Inpatient Hospital Stay: Payer: Commercial Managed Care - PPO | Attending: Hematology | Admitting: Hematology

## 2024-01-14 VITALS — BP 140/62 | HR 100 | Temp 97.4°F | Resp 21 | Ht 67.0 in | Wt 128.6 lb

## 2024-01-14 DIAGNOSIS — Z7982 Long term (current) use of aspirin: Secondary | ICD-10-CM | POA: Insufficient documentation

## 2024-01-14 DIAGNOSIS — Z79899 Other long term (current) drug therapy: Secondary | ICD-10-CM | POA: Diagnosis not present

## 2024-01-14 DIAGNOSIS — I700 Atherosclerosis of aorta: Secondary | ICD-10-CM | POA: Insufficient documentation

## 2024-01-14 DIAGNOSIS — R911 Solitary pulmonary nodule: Secondary | ICD-10-CM | POA: Diagnosis not present

## 2024-01-14 DIAGNOSIS — I2510 Atherosclerotic heart disease of native coronary artery without angina pectoris: Secondary | ICD-10-CM | POA: Insufficient documentation

## 2024-01-14 DIAGNOSIS — N200 Calculus of kidney: Secondary | ICD-10-CM | POA: Insufficient documentation

## 2024-01-14 DIAGNOSIS — I10 Essential (primary) hypertension: Secondary | ICD-10-CM | POA: Insufficient documentation

## 2024-01-14 DIAGNOSIS — R634 Abnormal weight loss: Secondary | ICD-10-CM | POA: Insufficient documentation

## 2024-01-14 DIAGNOSIS — C220 Liver cell carcinoma: Secondary | ICD-10-CM

## 2024-01-14 DIAGNOSIS — C7951 Secondary malignant neoplasm of bone: Secondary | ICD-10-CM | POA: Diagnosis not present

## 2024-01-14 DIAGNOSIS — G8918 Other acute postprocedural pain: Secondary | ICD-10-CM | POA: Diagnosis not present

## 2024-01-14 DIAGNOSIS — M25559 Pain in unspecified hip: Secondary | ICD-10-CM | POA: Diagnosis not present

## 2024-01-14 DIAGNOSIS — K7460 Unspecified cirrhosis of liver: Secondary | ICD-10-CM | POA: Diagnosis not present

## 2024-01-14 LAB — CBC WITH DIFFERENTIAL (CANCER CENTER ONLY)
Abs Immature Granulocytes: 0.01 10*3/uL (ref 0.00–0.07)
Basophils Absolute: 0 10*3/uL (ref 0.0–0.1)
Basophils Relative: 1 %
Eosinophils Absolute: 0.2 10*3/uL (ref 0.0–0.5)
Eosinophils Relative: 5 %
HCT: 35.1 % — ABNORMAL LOW (ref 39.0–52.0)
Hemoglobin: 11.5 g/dL — ABNORMAL LOW (ref 13.0–17.0)
Immature Granulocytes: 0 %
Lymphocytes Relative: 25 %
Lymphs Abs: 1.1 10*3/uL (ref 0.7–4.0)
MCH: 31.9 pg (ref 26.0–34.0)
MCHC: 32.8 g/dL (ref 30.0–36.0)
MCV: 97.5 fL (ref 80.0–100.0)
Monocytes Absolute: 0.5 10*3/uL (ref 0.1–1.0)
Monocytes Relative: 11 %
Neutro Abs: 2.4 10*3/uL (ref 1.7–7.7)
Neutrophils Relative %: 58 %
Platelet Count: 202 10*3/uL (ref 150–400)
RBC: 3.6 MIL/uL — ABNORMAL LOW (ref 4.22–5.81)
RDW: 13 % (ref 11.5–15.5)
WBC Count: 4.2 10*3/uL (ref 4.0–10.5)
nRBC: 0 % (ref 0.0–0.2)

## 2024-01-14 LAB — CMP (CANCER CENTER ONLY)
ALT: 19 U/L (ref 0–44)
AST: 16 U/L (ref 15–41)
Albumin: 4.5 g/dL (ref 3.5–5.0)
Alkaline Phosphatase: 72 U/L (ref 38–126)
Anion gap: 7 (ref 5–15)
BUN: 23 mg/dL (ref 8–23)
CO2: 24 mmol/L (ref 22–32)
Calcium: 9.7 mg/dL (ref 8.9–10.3)
Chloride: 109 mmol/L (ref 98–111)
Creatinine: 0.92 mg/dL (ref 0.61–1.24)
GFR, Estimated: >60 mL/min (ref >60)
Glucose, Bld: 125 mg/dL — ABNORMAL HIGH (ref 70–99)
Potassium: 3.9 mmol/L (ref 3.5–5.1)
Sodium: 140 mmol/L (ref 135–145)
Total Bilirubin: 0.4 mg/dL (ref 0.0–1.2)
Total Protein: 8 g/dL (ref 6.5–8.1)

## 2024-01-14 LAB — TSH: TSH: 0.831 u[IU]/mL (ref 0.350–4.500)

## 2024-01-14 MED ORDER — SODIUM CHLORIDE 0.9 % IV SOLN
Freq: Once | INTRAVENOUS | Status: AC
Start: 2024-01-14 — End: 2024-01-14

## 2024-01-14 MED ORDER — DURVALUMAB 500 MG/10ML IV SOLN
1500.0000 mg | Freq: Once | INTRAVENOUS | Status: AC
  Administered 2024-01-14: 1500 mg via INTRAVENOUS
  Filled 2024-01-14: qty 30

## 2024-01-14 NOTE — Assessment & Plan Note (Signed)
 diagnosed 11/2019 by MRI for f/u of cirrhosis. S/p microwave ablation on 03/01/20. -MRI 06/01/22 showed suspicion for local recurrence and metastasis at L5. Biopsy of L5 confirmed metastatic HCC. S/p repeat microwave ablation on 07/17/22. -started lenvima 08/16/22 -staging CT CAP and bone scan on 09/19/22 showed: interval enlargement of right hepatic lobe HCC; new retropulsed bony fragment at L5; no other new lesions. His previous CT was in 05/2022 and he did not start Lenvima until 07/2022, so it's hard to evaluate if he is responding to [facility] or not, the progression is likely the nature course of his [facility]  -Restaging CT scan from January 02, 2023 showed slightly improved previously treated liver lesion, and additional area of concern in the liver which are difficult to compare with previous scan due to different timing of contrast.  No other evidence of disease progression.  Plan to obtain MRI as next scan.  I personally reviewed the CT scan images and discussed the findings with patient -Will continue Lenvima, he is tolerating well overall. -Due to his weight being above 60 kg now, I increased his Lenvima to 12 mg daily in Feb 2024 -repeated liver MRI on 04/23/2023 showed stable treated liver lesion, L5 bone mets has slightly increased in size.  Patient developed worsening low back pain in July 2024, repeated lumbar MRI showed significant disease progression in the L5 bone lesion.  I personally reviewed the scan images and discussed the findings with patient. -I recommend palliative radiation to his L5 lesion for pain control -I recommend changing systemic treatment to immunotherapy, I discussed option of atezolizumab and bevacizumab, durvalumab and Imjudo, nivo and ipi etc. since he previously progressed on oral TKI, I recommend him to proceed with durvalumab and Imjudo. He started on 06/30/23 -the goal of therapy is curative/palliative to prolong life and prevent/improve cancer related symptoms. -he underwent  Bilateral laminotomies L5-S1 with decompression of the spinal canal and the L5 nerve roots epidural tumor by Dr. Danielle Dess On 11/20/2023 -he started SBRT on 12/15/2023

## 2024-01-14 NOTE — Progress Notes (Signed)
 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 S/p microwave ablation on 03/01/20. -MRI 06/01/22 showed suspicion for local recurrence and metastasis at L5. Biopsy of L5 confirmed metastatic HCC. S/p repeat microwave ablation on 07/17/22. -started lenvima 08/16/22 -staging CT CAP and bone scan on 09/19/22 showed: interval enlargement of right hepatic lobe HCC; new retropulsed bony fragment at L5; no other new lesions. His previous CT was in 05/2022 and he did not start Lenvima until 07/2022, so it's hard to evaluate if he is responding to [facility] or not, the progression is likely the nature course of his [facility]  -Restaging CT scan from January 02, 2023 showed slightly improved previously treated liver lesion, and additional area of concern in the liver which are difficult to compare with previous scan due to different timing of contrast.  No other evidence of disease progression.  Plan to obtain MRI as next scan.  I personally reviewed the CT scan images and discussed the findings with patient -Will continue Lenvima, he is tolerating well overall. -Due to his weight being above 60 kg now, I increased his Lenvima to 12 mg daily in Feb 2024 -repeated liver MRI on 04/23/2023 showed stable treated liver lesion, L5 bone mets has slightly increased in size.  Patient developed worsening low back pain in July 2024, repeated lumbar MRI showed significant disease  progression in the L5 bone lesion.  I personally reviewed the scan images and discussed the findings with patient. -I recommend palliative radiation to his L5 lesion for pain control -I recommend changing systemic treatment to immunotherapy, I discussed option of atezolizumab and bevacizumab, durvalumab and Imjudo, nivo and ipi etc. since he previously progressed on oral TKI, I recommend him to proceed with durvalumab and Imjudo. He started on 06/30/23 -the goal of therapy is curative/palliative to prolong life and prevent/improve cancer related symptoms. -he underwent Bilateral laminotomies L5-S1 with decompression of the spinal canal and the L5 nerve roots epidural tumor by Dr. 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 Completed radiation therapy a month ago. Recent imaging shows no new lesions in the liver, lymph nodes, or lungs. The treated liver lesion and bone metastasis are well-managed. Tolerating current treatment regimen well without significant side effects. Receiving Imfinzi every four weeks, known for potential side effects such as joint pain and skin rash, which he has not experienced. - Continue Imfinzi infusions every four weeks - Schedule follow-up scans every three to four months - Monitor for side effects such as joint pain and skin rash  Post-surgical pain Persistent pain in the calf and hip area post-surgery, rated 6-7 on the pain scale, occurring most of the time. Pain was present before surgery and has not resolved. - Discuss pain management options if pain persists  Weight loss Slight weight loss, current weight 128 pounds compared to 132 pounds previously.  Reports good appetite and consuming nutritional supplements like Ensure or Boost. - Encourage increased caloric intake to regain weight - Continue nutritional  supplements such as Ensure or Boost     Plan -Lab and CT scan reviewed, overall stable disease. -Will proceed durvalumab infusion today and continue every 4 weeks. -I spoke with his wife on the phone also.    SUMMARY OF ONCOLOGIC HISTORY: Oncology History  Hepatocellular carcinoma in adult [facility])  12/08/2019 Imaging   MR ABDOMEN WWO CONTRAST   IMPRESSION: Two small masses in the right hepatic lobe which have characteristics diagnostic for hepatocellular carcinoma in the setting of cirrhosis. (LI-RADS Category 5: Definitely HCC)   No evidence of abdominal metastatic disease or other significant abnormality.   03/01/2020 Initial Diagnosis   Hepatocellular carcinoma (HCC)   03/01/2020 Procedure   CT GUIDE TISSUE ABLATION     IMPRESSION: Successful percutaneous thermal ablation.   Signed,   [name], MD, RPVI   06/02/2020 Imaging   MR ABDOMEN WWO CONTRAST   IMPRESSION: 1. There has been interval ablation at two adjacent sites of the subcapsular right lobe of the liver, hepatic segment VI. There is no residual contrast enhancement at these adjacent sites. LI-RADS category 5 TR, nonviable. 2. There are scattered, subcentimeter foci of arterial hyperenhancement of the anterior right lobe of the liver, for example adjacent to the ablation site measuring 3 mm and in the medial anterior right lobe of the liver measuring 6 mm. This larger focus is unchanged compared to prior. Others were not previously seen. These findings are nonspecific and consistent with LI-RADS category 3. Attention on follow-up.   09/02/2020 Imaging   MR ABDOMEN WWO CONTRAST     IMPRESSION: 1. Post radiofrequency ablation along the margin of the RIGHT hemi liver. Heterogeneous appearance of this area on both T1 and T2. No sign of residual enhancement. LR TR nonviable. 2. Very subtle focus of restricted diffusion in the cephalad lateral segment LEFT hepatic lobe measuring 7 mm,  corresponding to low signal on image 27 of series 21. Arterial enhancement is not seen in this area and this areas not seen on prior imaging. Not clear whether this represents a new lesion as this area is obscured largely by artifact on most sequences on today's study and on previous imaging evaluations. LR category 3, consider a 3 to six-month follow-up with abdominal MRI. 3. Lesions of concern scattered about the RIGHT hepatic lobe may represent perfusional anomalies as they were not seen on today's study. 4. Stable mild dilation of the biliary tree.   11/27/2020 Imaging   MR ABDOMEN WWO CONTRAST   IMPRESSION: Minimal decrease in size of ablation defect in the peripheral anterior right hepatic lobe. No evidence of locally recurrent hepatocellular carcinoma or other suspicious liver lesions.   No evidence of metastatic disease or other acute findings.   04/03/2021 Imaging   MR ABDOMEN WWO CONTRAST   IMPRESSION: Stable ablation defect in the lateral right hepatic lobe, without evidence of recurrent tumor at this site.   5 mm hypovascular lesion in segment 5 of the right lobe was not definitely seen on previous study, but is too small to characterize. This shows no arterial phase hyperenhancement. Recommend continued follow-up by MRI in 6 months.   No evidence of abdominal metastatic disease.        09/03/2021 Imaging   MR ABDOMEN WWO CONTRAST   IMPRESSION: 1. Side-by-side ablation sites in the right hepatic lobe in segment 6. The more anterior site  remains largely cystic in appearance and no findings suspicious recurrent tumor in this area. There is however abnormal contrast enhancement around the larger more posterior and lateral ablation site worrisome for recurrent tumor. 2. Several small foci of early arterial phase enhancement in the liver. The peripheral lesions are likely vascular shunts. The more central lesions are likely dysplastic nodules. Recommend  continued surveillance. 3. Stable bilateral renal cysts.   12/07/2021 Imaging   MR ABDOMEN WWO CONTRAST  IMPRESSION: 1. Ablation defects in the right lobe of the liver, similar to the prior study, without definitive evidence to suggest local recurrence of disease or metastatic disease. 2. Multiple tiny arterial phase areas of nodular hyperenhancement scattered throughout the liver, predominantly in the right lobe of the liver, without perceptible signal abnormality or diffusion restriction on other pulse sequences, nonspecific, and likely small benign areas of vascular shunting. Continued attention on follow-up imaging is recommended. 3. Multiple Bosniak class 1 and Bosniak class 2 cysts in the kidneys bilaterally, similar to the prior study, as above. 4. Aortic atherosclerosis.     06/01/2022 Imaging   MR ABDOMEN WWO CONTRAST   IMPRESSION: 1. LR TR equivocal but suspicious findings with respect to previously treated disease in the RIGHT hepatic lobe given nodular enhancement adjacent and anterior superior to the ablation zone as described. 2. Given lack of late arterial phase due to bolus timing a second area of abnormality within the liver is suspicious for additional site of hepatocellular carcinoma showing washout appearance and capsule appearance on current imaging, cardiac pulsation limiting assessment in the area of concern. 3. Suspect bony metastatic disease in the lower lumbar spine at L5 level. Dedicated spinal imaging may be helpful for further evaluation.     06/18/2022 Imaging   MR Lumbar Spine W Wo Contrast   IMPRESSION: L5 vertebral body lesion is most consistent with a metastasis. There is probable trace ventral epidural extension   07/17/2022 Procedure   CT GUIDE TISSUE ABLATION   IMPRESSION: Successful CT-guided microwave ablation of recurrent hepatocellular carcinoma.   07/17/2022 Procedure   CT BONE TROCAR/NEEDLE BIOPSY SUPERFICIAL    IMPRESSION: Successful CT-guided core biopsy of L5 bone lesion.      07/17/2022 Pathology Results   SURGICAL PATHOLOGY  CASE: [id number]  PATIENT: Corey Armstrong  Surgical Pathology Report   SURGICAL PATHOLOGY  CASE: [id number]  PATIENT: Malachy Mood  Surgical Pathology Report      Clinical History: Hx of cirrhosis and small HCC with unexpected  suspicious lesion at L5. Rare HCC met to bone vs atypical hemangioma or  met from new primary. (crm)      FINAL MICROSCOPIC DIAGNOSIS:   A. 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 No acute findings within the chest. No suspicious findings for metastatic disease within the chest. 2. Unchanged 3 mm nodule within the medial right upper lobe. No new or suspicious lung nodules. 3. Suboptimal visualization of the known liver lesions, reflecting lack of IV contrast material. Similar appearance of ablation defect within the lateral right lobe of liver. 4. Left renal calculi. 5. Coronary artery calcifications. 6.  Aortic Atherosclerosis   06/25/2023 Imaging   NM Bone Scan whole body  IMPRESSION: 1. Known L5 metastasis demonstrates low level uptake, mildly increased from previous bone scan. 2. No new lesions demonstrated.   06/30/2023 -  Chemotherapy  Patient is on Treatment Plan : Hepatocellular Carcinoma Tremelimumab-actl C1 D1 + Durvalumab q28d       Imaging        Discussed the use of AI scribe software for clinical note transcription with the patient, who gave verbal consent to proceed.  History of Present Illness   A 68 year old patient with a history of hepatocellular carcinoma (HCC) presents for a follow-up visit after completing radiation therapy and back surgery. The patient reports persistent pain in the back and calf, which has  not improved since the surgery. The pain is described as a 6 or 7 out of 10 and is present most of the time. Despite the pain, the patient notes an overall improvement in his condition since the surgery. The patient has applied for disability and is not currently working due to his health condition. The patient's appetite has decreased slightly, with a few pounds of weight loss noted since the last visit. The patient is supplementing his diet with Ensure Boost.         All other systems were reviewed with the patient and are negative.  MEDICAL HISTORY:  Past Medical History:  Diagnosis Date   Headache    migraines   Hepatitis    HX OF hEP c ?    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    SURGICAL HISTORY: Past Surgical History:  Procedure Laterality Date   DECOMPRESSIVE LUMBAR LAMINECTOMY LEVEL 1 N/A 11/20/2023   Procedure: lumbar laminectomy right Lumbar five with decompression of nerve roots and excision of tumor;  Surgeon: [name], MD;  [facility] OR;  Service: Neurosurgery;  Laterality: N/A;   IR RADIOLOGIST EVAL & MGMT  01/25/2020   IR RADIOLOGIST EVAL & MGMT  03/21/2020   IR RADIOLOGIST EVAL & MGMT  06/07/2020   IR RADIOLOGIST EVAL & MGMT  09/05/2020   IR RADIOLOGIST EVAL & MGMT  12/05/2020   IR RADIOLOGIST EVAL & MGMT  05/09/2021   IR RADIOLOGIST EVAL & MGMT  09/06/2021   IR RADIOLOGIST EVAL & MGMT  12/12/2021   IR RADIOLOGIST EVAL & MGMT  06/25/2022   IR RADIOLOGIST EVAL & MGMT  07/24/2022   RADIOLOGY WITH ANESTHESIA N/A 03/01/2020   Procedure: CT WITH ANESTHESIA  THERMAL MICROWAVE  ABLATIION;  Surgeon: [name], MD;  Location: [facility] ORS;  Service: Anesthesiology;  Laterality: N/A;   RADIOLOGY WITH ANESTHESIA N/A 07/17/2022   Procedure: CT MICROWAVE ABLATION;  Surgeon: [name], MD;  Location: [facility] ORS;  Service: Radiology;  Laterality: N/A;    I have reviewed the social history and family history with the patient and they  are unchanged from previous note.  ALLERGIES:  has no known allergies.  MEDICATIONS:  Current Outpatient Medications  Medication Sig Dispense Refill   acetaminophen (TYLENOL) 500 MG tablet Take 1,000 mg by mouth every 6 (six) hours as needed.     aspirin EC 81 MG tablet Take 1 tablet (81 mg total) by mouth daily. Swallow whole. 30 tablet 12   atorvastatin (LIPITOR) 40 MG tablet Take 1 tablet (40 mg total) by mouth daily. 30 tablet 11   hydrALAZINE (APRESOLINE) 10 MG tablet Take 1 tablet (10 mg total) by mouth 2 (two) times daily as needed. Take for systolic blood pressure more than 170. 60 tablet 1   olmesartan (BENICAR) 20 MG tablet Take 1 tablet (20 mg total) by mouth daily. 30 tablet 11   No current facility-administered medications  for this visit.    PHYSICAL EXAMINATION: ECOG PERFORMANCE STATUS: 1 - Symptomatic but completely ambulatory  Vitals:   01/14/24 1140  BP: (!) 140/62  Pulse: 100  Resp: (!) 21  Temp: (!) 97.4 F (36.3 C)  SpO2: 99%   Wt Readings from Last 3 Encounters:  01/14/24 128 lb 9.6 oz (58.3 kg)  12/23/23 132 lb (59.9 kg)  12/17/23 126 lb 8 oz (57.4 kg)     GENERAL:alert, no distress and comfortable SKIN: skin color, texture, turgor are normal, no rashes or significant lesions EYES: normal, Conjunctiva are pink and non-injected, sclera clear NECK: supple, thyroid normal size, non-tender, without nodularity LYMPH:  no palpable lymphadenopathy in the cervical, axillary  LUNGS: clear to auscultation and percussion with normal breathing effort HEART: regular rate & rhythm and no murmurs and no lower extremity edema ABDOMEN:abdomen soft, non-tender and normal bowel sounds Musculoskeletal:no cyanosis of digits and no clubbing  NEURO: alert & oriented x 3 with fluent speech, no focal motor/sensory deficits   LABORATORY DATA:  I have reviewed the data as listed    Latest Ref Rng & Units 01/14/2024   11:12 AM 12/17/2023    9:27 AM 12/08/2023   10:15 PM  CBC   WBC 4.0 - 10.5 K/uL 4.2  4.4  5.3   Hemoglobin 13.0 - 17.0 g/dL 65.7  84.6  9.8   Hematocrit 39.0 - 52.0 % 35.1  31.2  29.9   Platelets 150 - 400 K/uL 202  197  212         Latest Ref Rng & Units 01/14/2024   11:12 AM 12/17/2023    9:27 AM 12/08/2023   10:15 PM  CMP  Glucose 70 - 99 mg/dL 962  90    BUN 8 - 23 mg/dL 23  29    Creatinine 9.52 - 1.24 mg/dL 8.41  3.24  4.01   Sodium 135 - 145 mmol/L 140  143    Potassium 3.5 - 5.1 mmol/L 3.9  4.3    Chloride 98 - 111 mmol/L 109  113    CO2 22 - 32 mmol/L 24  24    Calcium 8.9 - 10.3 mg/dL 9.7  9.9    Total Protein 6.5 - 8.1 g/dL 8.0  7.3    Total Bilirubin 0.0 - 1.2 mg/dL 0.4  0.3    Alkaline Phos 38 - 126 U/L 72  69    AST 15 - 41 U/L 16  17    ALT 0 - 44 U/L 19  15        RADIOGRAPHIC STUDIES: I have personally reviewed the radiological images as listed and agreed with the findings in the report. No results found.    Orders Placed This Encounter  Procedures   CBC with Differential (Cancer Center Only)    Standing Status:   Future    Expected Date:   03/10/2024    Expiration Date:   03/10/2025   CMP (Cancer Center only)    Standing Status:   Future    Expected Date:   03/10/2024    Expiration Date:   03/10/2025   T4    Standing Status:   Future    Expected Date:   03/10/2024    Expiration Date:   03/10/2025   TSH    Standing Status:   Future    Expected Date:   03/10/2024    Expiration Date:   03/10/2025   CBC with Differential (Cancer Center Only)  Standing Status:   Future    Expected Date:   04/07/2024    Expiration Date:   04/07/2025   CMP (Cancer Center only)    Standing Status:   Future    Expected Date:   04/07/2024    Expiration Date:   04/07/2025   T4    Standing Status:   Future    Expected Date:   04/07/2024    Expiration Date:   04/07/2025   TSH    Standing Status:   Future    Expected Date:   04/07/2024    Expiration Date:   04/07/2025   CBC with Differential (Cancer Center Only)    Standing Status:    Future    Expected Date:   05/05/2024    Expiration Date:   05/05/2025   CMP (Cancer Center only)    Standing Status:   Future    Expected Date:   05/05/2024    Expiration Date:   05/05/2025   T4    Standing Status:   Future    Expected Date:   05/05/2024    Expiration Date:   05/05/2025   TSH    Standing Status:   Future    Expected Date:   05/05/2024    Expiration Date:   05/05/2025   All questions were answered. The patient knows to call the clinic with any problems, questions or concerns. No barriers to learning was detected. The total time spent in the appointment was 30 minutes.     Malachy Mood, MD 01/14/2024

## 2024-01-14 NOTE — Patient Instructions (Signed)

## 2024-01-15 LAB — T4: T4, Total: 8.6 ug/dL (ref 4.5–12.0)

## 2024-01-17 ENCOUNTER — Encounter: Payer: Self-pay | Admitting: Hematology

## 2024-01-27 ENCOUNTER — Ambulatory Visit
Admission: RE | Admit: 2024-01-27 | Discharge: 2024-01-27 | Disposition: A | Discharge status: Home / Self Care | Payer: Commercial Managed Care - PPO | Source: Ambulatory Visit | Attending: Hematology | Admitting: Hematology

## 2024-01-27 NOTE — Progress Notes (Signed)
  Radiation Oncology         [phone]) [phone] ________________________________  Name: Corey Armstrong Sr. MRN: [id number]  Date of Service: 01/27/2024  DOB: January 13, 1956  Post Treatment Telephone Note  Diagnosis:  C79.51 Secondary malignant neoplasm of bone (as documented in provider EOT note)  The patient was available for call today.  The patient did not note fatigue during radiation. The patient did not note skin changes in the field of radiation during therapy. The patient has noticed improvement in pain in the area(s) treated with radiation. The patient is not taking dexamethasone. The patient does not have symptoms of  weakness or loss of control of the extremities. The patient does not have symptoms of headache. The patient does not have symptoms of seizure or uncontrolled movement. The patient does not have symptoms of changes in vision. The patient does not have changes in speech. The patient does not have confusion.   The patient is scheduled for ongoing care with Dr. Mosetta Putt in medical oncology. The patient was encouraged to call if he develops concerns or questions regarding radiation.  This concludes the interaction.  Ruel Favors, LPN

## 2024-02-10 ENCOUNTER — Other Ambulatory Visit: Payer: Self-pay

## 2024-02-10 DIAGNOSIS — C7951 Secondary malignant neoplasm of bone: Secondary | ICD-10-CM

## 2024-02-10 NOTE — Progress Notes (Unsigned)
 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 S/p microwave ablation on 03/01/20. -MRI 06/01/22 showed suspicion for local recurrence and metastasis at L5. Biopsy of L5 confirmed metastatic HCC. S/p repeat microwave ablation on 07/17/22. -started lenvima 08/16/22 -staging CT CAP and bone scan on 09/19/22 showed: interval enlargement of right hepatic lobe HCC; new retropulsed bony fragment at L5; no other new lesions. His previous CT was in 05/2022 and he did not start Lenvima until 07/2022, so it's hard to evaluate if he is responding to [facility] or not, the progression is likely the nature course of his [facility]  -Restaging CT scan from January 02, 2023 showed slightly improved previously treated liver lesion, and additional area of concern in the liver which are difficult to compare with previous scan due to different timing of contrast.  No other evidence of disease progression.  Plan to obtain MRI as next scan.   -Will continue Lenvima, he is tolerating well overall. -Due to his weight being above 60 kg now, His Lenvima was increased to 12 mg daily in Feb 2024 -repeated liver MRI on 04/23/2023 showed stable treated liver lesion, L5 bone mets has slightly increased in size.  Patient developed worsening low back pain in July 2024, repeated lumbar MRI showed significant disease progression in the L5 bone lesion.  -Palliative radiation to his L5 lesion for pain control was recommended.  -Changing systemic treatment to immunotherapy was recommended. The option of atezolizumab and  bevacizumab, durvalumab and Imjudo, nivo and ipi etc. since he previously progressed on oral TKI, it was recommended that he proceed with durvalumab and Imjudo. He started on 06/30/23 -the goal of therapy is curative/palliative to prolong life and prevent/improve cancer related symptoms. -he underwent Bilateral laminotomies L5-S1 with decompression of the spinal canal and the L5 nerve roots epidural tumor by Dr. Danielle Dess On 11/20/2023 -he started SBRT on 12/15/2023 and has completed radiation.  - Next CT CAP should be scheduled for June 2025.   Back pain and leg weakness 11/20/2023, patient had bilateral laminectomy of L5/S1 and decompression of the spinal canal and L5 nerve root due to epidural tumor.  He has finished radiation treatment to the spine.  He is doing physical therapy exercises at home.  No longer taking pain medication other than Tylenol.  Does have some gabapentin if needed.  Wearing a brace on his right foot to help manage foot drop.  This is improving.  Using a cane to help with ambulation and mobility.  Will monitor closely.  Plan Mild and stable anemia.  Other labs are stable and essentially normal. Proceed with durvalumab today. Surveillance CT CAP should be scheduled in June 2025. Labs/flush, follow-up, and durvalumab in 4 weeks as scheduled.  The patient understands the plans discussed today and is in agreement with them.  He knows to contact our office if he develops concerns prior to his next appointment.  I provided 25 minutes of face-to-face time during this encounter and > 50% was spent counseling as documented under my assessment and plan.    Carlean Jews, NP  [facility]  [facility] - A DEPT OF [facility] [address] [place] [address] Dept: [phone] Dept Fax: [phone]   No orders of the defined types were placed in this encounter.     CHIEF COMPLAINT:  CC: Hepatocellular carcinoma  Current  Treatment: Durvalumab every 4 weeks  INTERVAL HISTORY:  Corey Armstrong is here today for repeat clinical assessment. He was last seen 01/14/2024.  States he does have some persistent back pain surgery done 11/20/2023.  Does have some weakness in the right foot.  Using a brace to help with foot drop.  Has had physical therapy and continues to do exercises at home.  Using a cane to help with ambulation and mobility.  He denies chest pain, chest pressure, or shortness of breath. He denies headaches or visual disturbances. He denies abdominal pain, nausea, vomiting, or changes in bowel or bladder habits.  He denies fevers or chills. He denies pain. His appetite is good. His weight has been stable he is sleepy.  I have reviewed the past medical history, past surgical history, social history and family history with the patient and they are unchanged from previous note.  ALLERGIES:  has no known allergies.  MEDICATIONS:  Current Outpatient Medications  Medication Sig Dispense Refill   acetaminophen (TYLENOL) 500 MG tablet Take 1,000 mg by mouth every 6 (six) hours as needed.     aspirin EC 81 MG tablet Take 1 tablet (81 mg total) by mouth daily. Swallow whole. 30 tablet 12   atorvastatin (LIPITOR) 40 MG tablet Take 1 tablet (40 mg total) by mouth daily. 30 tablet 11   hydrALAZINE (APRESOLINE) 10 MG tablet Take 1 tablet (10 mg total) by mouth 2 (two) times daily as needed. Take for systolic blood pressure more than 170. 60 tablet 1   olmesartan (BENICAR) 20 MG tablet Take 1 tablet (20 mg total) by mouth daily. 30 tablet 11   No current facility-administered medications for this visit.    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 (LI-RADS Category 5: Definitely  HCC)   No evidence of abdominal metastatic disease or other significant abnormality.   03/01/2020 Initial Diagnosis   Hepatocellular carcinoma (HCC)   03/01/2020 Procedure   CT GUIDE TISSUE ABLATION     IMPRESSION: Successful percutaneous thermal ablation.   Signed,   [name], MD, RPVI   06/02/2020 Imaging   MR ABDOMEN WWO CONTRAST   IMPRESSION: 1. There has been interval ablation at two adjacent sites of the subcapsular right lobe of the liver, hepatic segment VI. There is no residual contrast enhancement at these adjacent sites. LI-RADS category 5 TR, nonviable. 2. There are scattered, subcentimeter foci of arterial hyperenhancement of the anterior right lobe of the liver, for example adjacent to the ablation site measuring 3 mm and in the medial anterior right lobe of the liver measuring 6 mm. This larger focus is unchanged compared to prior. Others were not previously seen. These findings are nonspecific and consistent with LI-RADS category 3. Attention on follow-up.   09/02/2020 Imaging   MR ABDOMEN WWO CONTRAST     IMPRESSION: 1. Post radiofrequency ablation along the margin of the RIGHT hemi liver. Heterogeneous appearance of this area on both T1 and T2. No sign  of residual enhancement. LR TR nonviable. 2. Very subtle focus of restricted diffusion in the cephalad lateral segment LEFT hepatic lobe measuring 7 mm, corresponding to low signal on image 27 of series 21. Arterial enhancement is not seen in this area and this areas not seen on prior imaging. Not clear whether this represents a new lesion as this area is obscured largely by artifact on most sequences on today's study and on previous imaging evaluations. LR category 3, consider a 3 to six-month follow-up with abdominal MRI. 3. Lesions of concern scattered about the RIGHT hepatic lobe may represent perfusional anomalies as they were not seen on today's study. 4. Stable mild dilation of the  biliary tree.   11/27/2020 Imaging   MR ABDOMEN WWO CONTRAST   IMPRESSION: Minimal decrease in size of ablation defect in the peripheral anterior right hepatic lobe. No evidence of locally recurrent hepatocellular carcinoma or other suspicious liver lesions.   No evidence of metastatic disease or other acute findings.   04/03/2021 Imaging   MR ABDOMEN WWO CONTRAST   IMPRESSION: Stable ablation defect in the lateral right hepatic lobe, without evidence of recurrent tumor at this site.   5 mm hypovascular lesion in segment 5 of the right lobe was not definitely seen on previous study, but is too small to characterize. This shows no arterial phase hyperenhancement. Recommend continued follow-up by MRI in 6 months.   No evidence of abdominal metastatic disease.        09/03/2021 Imaging   MR ABDOMEN WWO CONTRAST   IMPRESSION: 1. Side-by-side ablation sites in the right hepatic lobe in segment 6. The more anterior site remains largely cystic in appearance and no findings suspicious recurrent tumor in this area. There is however abnormal contrast enhancement around the larger more posterior and lateral ablation site worrisome for recurrent tumor. 2. Several small foci of early arterial phase enhancement in the liver. The peripheral lesions are likely vascular shunts. The more central lesions are likely dysplastic nodules. Recommend continued surveillance. 3. Stable bilateral renal cysts.   12/07/2021 Imaging   MR ABDOMEN WWO CONTRAST  IMPRESSION: 1. Ablation defects in the right lobe of the liver, similar to the prior study, without definitive evidence to suggest local recurrence of disease or metastatic disease. 2. Multiple tiny arterial phase areas of nodular hyperenhancement scattered throughout the liver, predominantly in the right lobe of the liver, without perceptible signal abnormality or diffusion restriction on other pulse sequences, nonspecific, and likely  small benign areas of vascular shunting. Continued attention on follow-up imaging is recommended. 3. Multiple Bosniak class 1 and Bosniak class 2 cysts in the kidneys bilaterally, similar to the prior study, as above. 4. Aortic atherosclerosis.     06/01/2022 Imaging   MR ABDOMEN WWO CONTRAST   IMPRESSION: 1. LR TR equivocal but suspicious findings with respect to previously treated disease in the RIGHT hepatic lobe given nodular enhancement adjacent and anterior superior to the ablation zone as described. 2. Given lack of late arterial phase due to bolus timing a second area of abnormality within the liver is suspicious for additional site of hepatocellular carcinoma showing washout appearance and capsule appearance on current imaging, cardiac pulsation limiting assessment in the area of concern. 3. Suspect bony metastatic disease in the lower lumbar spine at L5 level. Dedicated spinal imaging may be helpful for further evaluation.     06/18/2022 Imaging   MR Lumbar Spine W Wo Contrast   IMPRESSION: L5 vertebral body lesion is most consistent with  a metastasis. There is probable trace ventral epidural extension   07/17/2022 Procedure   CT GUIDE TISSUE ABLATION   IMPRESSION: Successful CT-guided microwave ablation of recurrent hepatocellular carcinoma.   07/17/2022 Procedure   CT BONE TROCAR/NEEDLE BIOPSY SUPERFICIAL   IMPRESSION: Successful CT-guided core biopsy of L5 bone lesion.      07/17/2022 Pathology Results   SURGICAL PATHOLOGY  CASE: [id number]  PATIENT: YUL DIANA  Surgical Pathology Report   SURGICAL PATHOLOGY  CASE: [id number]  PATIENT: Malachy Mood  Surgical Pathology Report      Clinical History: Hx of cirrhosis and small HCC with unexpected  suspicious lesion at L5. Rare HCC met to bone vs atypical hemangioma or  met from new primary. (crm)      FINAL MICROSCOPIC DIAGNOSIS:   A. 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 No acute findings within the chest. No suspicious findings for metastatic disease within the chest. 2. Unchanged 3 mm nodule within the medial right upper lobe. No new or suspicious lung nodules. 3. Suboptimal visualization of the known liver lesions, reflecting lack of IV contrast material. Similar appearance of ablation defect within the lateral right lobe of liver. 4. Left renal calculi. 5. Coronary artery calcifications. 6.  Aortic Atherosclerosis   06/25/2023 Imaging   NM Bone Scan whole body  IMPRESSION: 1. Known L5 metastasis demonstrates low level uptake, mildly increased from previous bone scan. 2. No new lesions demonstrated.   06/30/2023 -  Chemotherapy   Patient is on Treatment Plan : Hepatocellular Carcinoma Tremelimumab-actl C1 D1 + Durvalumab q28d       Imaging         REVIEW OF SYSTEMS:   Constitutional: Denies fevers, chills or abnormal weight loss Eyes: Denies blurriness of vision Ears, nose, mouth, throat, and face: Denies mucositis or sore throat Respiratory: Denies cough, dyspnea or wheezes Cardiovascular: Denies palpitation, chest discomfort or lower extremity swelling Gastrointestinal:  Denies nausea, heartburn or change in bowel habits Skin: Denies abnormal skin rashes Lymphatics: Denies new lymphadenopathy or easy bruising Neurological:Denies numbness, tingling or new weaknesses.  Continues to have low back pain with weakness of the right leg.  Persistent right foot drop but slowly improving. Behavioral/Psych: Mood is stable, no new changes  All other systems were reviewed with the patient and are negative.   VITALS:   Today's Vitals   02/11/24 1329 02/11/24 1332  BP: (!) 150/42 (!) 132/58  Pulse: 80   Resp: (!) 22   Temp: 97.7 F (36.5 C)   TempSrc: Temporal   SpO2: 99%    Weight: 129 lb 8 oz (58.7 kg)   Height: 5\' 7"  (1.702 m)   PainSc: 0-No pain    Body mass index is 20.28 kg/m.   Wt Readings from Last 3 Encounters:  02/11/24 129 lb 8 oz (58.7 kg)  01/14/24 128 lb 9.6 oz (58.3 kg)  12/23/23 132 lb (59.9 kg)    Body mass index is 20.28 kg/m.  Performance status (ECOG): 1 - Symptomatic but completely ambulatory  PHYSICAL EXAM:   GENERAL:alert, no distress and comfortable SKIN: skin color, texture, turgor are normal, no rashes or significant lesions EYES: normal, Conjunctiva are pink and non-injected, sclera clear OROPHARYNX:no exudate, no erythema and lips, buccal mucosa, and tongue normal  NECK: supple, thyroid normal size, non-tender, without nodularity LYMPH:  no  palpable lymphadenopathy in the cervical, axillary or inguinal LUNGS: clear to auscultation and percussion with normal breathing effort HEART: regular rate & rhythm and no murmurs and no lower extremity edema ABDOMEN:abdomen soft, non-tender and normal bowel sounds Musculoskeletal:no cyanosis of digits and no clubbing  NEURO: alert & oriented x 3 with fluent speech, no focal motor/sensory deficits  LABORATORY DATA:  I have reviewed the data as listed    Component Value Date/Time   NA 140 02/11/2024 1307   K 4.0 02/11/2024 1307   CL 108 02/11/2024 1307   CO2 25 02/11/2024 1307   GLUCOSE 93 02/11/2024 1307   BUN 13 02/11/2024 1307   CREATININE 0.97 02/11/2024 1307   CREATININE 1.07 09/19/2021 1412   CALCIUM 9.8 02/11/2024 1307   PROT 8.1 02/11/2024 1307   ALBUMIN 4.4 02/11/2024 1307   AST 17 02/11/2024 1307   ALT 17 02/11/2024 1307   ALKPHOS 108 02/11/2024 1307   BILITOT 0.3 02/11/2024 1307   GFRNONAA >60 02/11/2024 1307   GFRNONAA 60 11/14/2020 0958   GFRAA 70 11/14/2020 0958    Lab Results  Component Value Date   WBC 5.0 02/11/2024   NEUTROABS 3.0 02/11/2024   HGB 11.1 (L) 02/11/2024   HCT 33.8 (L) 02/11/2024   MCV 94.9 02/11/2024   PLT 237 02/11/2024

## 2024-02-10 NOTE — Assessment & Plan Note (Addendum)
 diagnosed 11/2019 by MRI for f/u of cirrhosis. S/p microwave ablation on 03/01/20. -MRI 06/01/22 showed suspicion for local recurrence and metastasis at L5. Biopsy of L5 confirmed metastatic HCC. S/p repeat microwave ablation on 07/17/22. -started lenvima 08/16/22 -staging CT CAP and bone scan on 09/19/22 showed: interval enlargement of right hepatic lobe HCC; new retropulsed bony fragment at L5; no other new lesions. His previous CT was in 05/2022 and he did not start Lenvima until 07/2022, so it's hard to evaluate if he is responding to [facility] or not, the progression is likely the nature course of his [facility]  -Restaging CT scan from January 02, 2023 showed slightly improved previously treated liver lesion, and additional area of concern in the liver which are difficult to compare with previous scan due to different timing of contrast.  No other evidence of disease progression.  Plan to obtain MRI as next scan.   -Will continue Lenvima, he is tolerating well overall. -Due to his weight being above 60 kg now, His Lenvima was increased to 12 mg daily in Feb 2024 -repeated liver MRI on 04/23/2023 showed stable treated liver lesion, L5 bone mets has slightly increased in size.  Patient developed worsening low back pain in July 2024, repeated lumbar MRI showed significant disease progression in the L5 bone lesion.  -Palliative radiation to his L5 lesion for pain control was recommended.  -Changing systemic treatment to immunotherapy was recommended. The option of atezolizumab and bevacizumab, durvalumab and Imjudo, nivo and ipi etc. since he previously progressed on oral TKI, it was recommended that he proceed with durvalumab and Imjudo. He started on 06/30/23 -the goal of therapy is curative/palliative to prolong life and prevent/improve cancer related symptoms. -he underwent Bilateral laminotomies L5-S1 with decompression of the spinal canal and the L5 nerve roots epidural tumor by Dr. Danielle Dess On 11/20/2023 -he started  SBRT on 12/15/2023

## 2024-02-11 ENCOUNTER — Inpatient Hospital Stay

## 2024-02-11 ENCOUNTER — Inpatient Hospital Stay: Attending: Hematology

## 2024-02-11 ENCOUNTER — Inpatient Hospital Stay (HOSPITAL_BASED_OUTPATIENT_CLINIC_OR_DEPARTMENT_OTHER): Admitting: Nurse Practitioner

## 2024-02-11 ENCOUNTER — Encounter: Payer: Self-pay | Admitting: Hematology

## 2024-02-11 VITALS — BP 132/58 | HR 80 | Temp 97.7°F | Resp 22 | Ht 67.0 in | Wt 129.5 lb

## 2024-02-11 DIAGNOSIS — Z923 Personal history of irradiation: Secondary | ICD-10-CM | POA: Diagnosis not present

## 2024-02-11 DIAGNOSIS — I700 Atherosclerosis of aorta: Secondary | ICD-10-CM | POA: Insufficient documentation

## 2024-02-11 DIAGNOSIS — Z5112 Encounter for antineoplastic immunotherapy: Secondary | ICD-10-CM | POA: Insufficient documentation

## 2024-02-11 DIAGNOSIS — Z79899 Other long term (current) drug therapy: Secondary | ICD-10-CM | POA: Diagnosis not present

## 2024-02-11 DIAGNOSIS — C220 Liver cell carcinoma: Secondary | ICD-10-CM

## 2024-02-11 DIAGNOSIS — D649 Anemia, unspecified: Secondary | ICD-10-CM | POA: Diagnosis not present

## 2024-02-11 DIAGNOSIS — N281 Cyst of kidney, acquired: Secondary | ICD-10-CM | POA: Diagnosis not present

## 2024-02-11 DIAGNOSIS — Z7982 Long term (current) use of aspirin: Secondary | ICD-10-CM | POA: Insufficient documentation

## 2024-02-11 DIAGNOSIS — N200 Calculus of kidney: Secondary | ICD-10-CM | POA: Insufficient documentation

## 2024-02-11 DIAGNOSIS — K7460 Unspecified cirrhosis of liver: Secondary | ICD-10-CM | POA: Diagnosis not present

## 2024-02-11 DIAGNOSIS — C7951 Secondary malignant neoplasm of bone: Secondary | ICD-10-CM | POA: Diagnosis not present

## 2024-02-11 DIAGNOSIS — R911 Solitary pulmonary nodule: Secondary | ICD-10-CM | POA: Diagnosis not present

## 2024-02-11 DIAGNOSIS — I2510 Atherosclerotic heart disease of native coronary artery without angina pectoris: Secondary | ICD-10-CM | POA: Diagnosis not present

## 2024-02-11 LAB — CBC WITH DIFFERENTIAL (CANCER CENTER ONLY)
Abs Immature Granulocytes: 0.01 10*3/uL (ref 0.00–0.07)
Basophils Absolute: 0.1 10*3/uL (ref 0.0–0.1)
Basophils Relative: 1 %
Eosinophils Absolute: 0.2 10*3/uL (ref 0.0–0.5)
Eosinophils Relative: 4 %
HCT: 33.8 % — ABNORMAL LOW (ref 39.0–52.0)
Hemoglobin: 11.1 g/dL — ABNORMAL LOW (ref 13.0–17.0)
Immature Granulocytes: 0 %
Lymphocytes Relative: 22 %
Lymphs Abs: 1.1 10*3/uL (ref 0.7–4.0)
MCH: 31.2 pg (ref 26.0–34.0)
MCHC: 32.8 g/dL (ref 30.0–36.0)
MCV: 94.9 fL (ref 80.0–100.0)
Monocytes Absolute: 0.6 10*3/uL (ref 0.1–1.0)
Monocytes Relative: 12 %
Neutro Abs: 3 10*3/uL (ref 1.7–7.7)
Neutrophils Relative %: 61 %
Platelet Count: 237 10*3/uL (ref 150–400)
RBC: 3.56 MIL/uL — ABNORMAL LOW (ref 4.22–5.81)
RDW: 12.6 % (ref 11.5–15.5)
WBC Count: 5 10*3/uL (ref 4.0–10.5)
nRBC: 0 % (ref 0.0–0.2)

## 2024-02-11 LAB — CMP (CANCER CENTER ONLY)
ALT: 17 U/L (ref 0–44)
AST: 17 U/L (ref 15–41)
Albumin: 4.4 g/dL (ref 3.5–5.0)
Alkaline Phosphatase: 108 U/L (ref 38–126)
Anion gap: 7 (ref 5–15)
BUN: 13 mg/dL (ref 8–23)
CO2: 25 mmol/L (ref 22–32)
Calcium: 9.8 mg/dL (ref 8.9–10.3)
Chloride: 108 mmol/L (ref 98–111)
Creatinine: 0.97 mg/dL (ref 0.61–1.24)
GFR, Estimated: >60 mL/min (ref >60)
Glucose, Bld: 93 mg/dL (ref 70–99)
Potassium: 4 mmol/L (ref 3.5–5.1)
Sodium: 140 mmol/L (ref 135–145)
Total Bilirubin: 0.3 mg/dL (ref 0.0–1.2)
Total Protein: 8.1 g/dL (ref 6.5–8.1)

## 2024-02-11 LAB — TSH: TSH: 0.966 u[IU]/mL (ref 0.350–4.500)

## 2024-02-11 MED ORDER — SODIUM CHLORIDE 0.9 % IV SOLN
1500.0000 mg | Freq: Once | INTRAVENOUS | Status: AC
  Administered 2024-02-11: 1500 mg via INTRAVENOUS
  Filled 2024-02-11: qty 30

## 2024-02-11 MED ORDER — SODIUM CHLORIDE 0.9 % IV SOLN: Freq: Once | INTRAVENOUS | Status: AC

## 2024-02-11 NOTE — Patient Instructions (Signed)
 Durvalumab Injection What is this medication? DURVALUMAB (dur VAL ue mab) treats some types of cancer. It works by helping your immune system slow or stop the spread of cancer cells. It is a monoclonal antibody. This medicine may be used for other purposes; ask your health care provider or pharmacist if you have questions. COMMON BRAND NAME(S): IMFINZI What should I tell my care team before I take this medication? They need to know if you have any of these conditions: Allogeneic stem cell transplant (uses someone else's stem cells) Autoimmune diseases, such as Crohn disease, ulcerative colitis, lupus History of chest radiation Nervous system problems, such as Guillain-Barre syndrome, myasthenia gravis Organ transplant An unusual or allergic reaction to durvalumab, other medications, foods, dyes, or preservatives Pregnant or trying to get pregnant Breast-feeding How should I use this medication? This medication is infused into a vein. It is given by your care team in a hospital or clinic setting. A special MedGuide will be given to you before each treatment. Be sure to read this information carefully each time. Talk to your care team about the use of this medication in children. Special care may be needed. Overdosage: If you think you have taken too much of this medicine contact a poison control center or emergency room at once. NOTE: This medicine is only for you. Do not share this medicine with others. What if I miss a dose? Keep appointments for follow-up doses. It is important not to miss your dose. Call your care team if you are unable to keep an appointment. What may interact with this medication? Interactions have not been studied. This list may not describe all possible interactions. Give your health care provider a list of all the medicines, herbs, non-prescription drugs, or dietary supplements you use. Also tell them if you smoke, drink alcohol, or use illegal drugs. Some items may  interact with your medicine. What should I watch for while using this medication? Your condition will be monitored carefully while you are receiving this medication. You may need blood work while taking this medication. This medication may cause serious skin reactions. They can happen weeks to months after starting the medication. Contact your care team right away if you notice fevers or flu-like symptoms with a rash. The rash may be red or purple and then turn into blisters or peeling of the skin. You may also notice a red rash with swelling of the face, lips, or lymph nodes in your neck or under your arms. Tell your care team right away if you have any change in your eyesight. Talk to your care team if you may be pregnant. Serious birth defects can occur if you take this medication during pregnancy and for 3 months after the last dose. You will need a negative pregnancy test before starting this medication. Contraception is recommended while taking this medication and for 3 months after the last dose. Your care team can help you find the option that works for you. Do not breastfeed while taking this medication and for 3 months after the last dose. What side effects may I notice from receiving this medication? Side effects that you should report to your care team as soon as possible: Allergic reactions--skin rash, itching, hives, swelling of the face, lips, tongue, or throat Dry cough, shortness of breath or trouble breathing Eye pain, redness, irritation, or discharge with blurry or decreased vision Heart muscle inflammation--unusual weakness or fatigue, shortness of breath, chest pain, fast or irregular heartbeat, dizziness, swelling of the  ankles, feet, or hands Hormone gland problems--headache, sensitivity to light, unusual weakness or fatigue, dizziness, fast or irregular heartbeat, increased sensitivity to cold or heat, excessive sweating, constipation, hair loss, increased thirst or amount of  urine, tremors or shaking, irritability Infusion reactions--chest pain, shortness of breath or trouble breathing, feeling faint or lightheaded Kidney injury (glomerulonephritis)--decrease in the amount of urine, red or dark brown urine, foamy or bubbly urine, swelling of the ankles, hands, or feet Liver injury--right upper belly pain, loss of appetite, nausea, light-colored stool, dark yellow or brown urine, yellowing skin or eyes, unusual weakness or fatigue Pain, tingling, or numbness in the hands or feet, muscle weakness, change in vision, confusion or trouble speaking, loss of balance or coordination, trouble walking, seizures Rash, fever, and swollen lymph nodes Redness, blistering, peeling, or loosening of the skin, including inside the mouth Sudden or severe stomach pain, bloody diarrhea, fever, nausea, vomiting Side effects that usually do not require medical attention (report these to your care team if they continue or are bothersome): Bone, joint, or muscle pain Diarrhea Fatigue Loss of appetite Nausea Skin rash This list may not describe all possible side effects. Call your doctor for medical advice about side effects. You may report side effects to FDA at 1-800-FDA-1088. Where should I keep my medication? This medication is given in a hospital or clinic. It will not be stored at home. NOTE: This sheet is a summary. It may not cover all possible information. If you have questions about this medicine, talk to your doctor, pharmacist, or health care provider.  2024 Elsevier/Gold Standard (2022-03-12 00:00:00)

## 2024-02-12 LAB — T4: T4, Total: 8.5 ug/dL (ref 4.5–12.0)

## 2024-02-13 ENCOUNTER — Encounter: Payer: Self-pay | Admitting: Nurse Practitioner

## 2024-02-15 ENCOUNTER — Other Ambulatory Visit (HOSPITAL_BASED_OUTPATIENT_CLINIC_OR_DEPARTMENT_OTHER): Payer: Self-pay | Admitting: Family

## 2024-03-09 NOTE — Assessment & Plan Note (Signed)
 diagnosed 11/2019 by MRI for f/u of cirrhosis. S/p microwave ablation on 03/01/20. -MRI 06/01/22 showed suspicion for local recurrence and metastasis at L5. Biopsy of L5 confirmed metastatic HCC. S/p repeat microwave ablation on 07/17/22. -started lenvima  08/16/22 -staging CT CAP and bone scan on 09/19/22 showed: interval enlargement of right hepatic lobe HCC; new retropulsed bony fragment at L5; no other new lesions. His previous CT was in 05/2022 and he did not start Lenvima  until 07/2022, so it's hard to evaluate if he is responding to Lenvima  or not, the progression is likely the nature course of his [facility]  -Restaging CT scan from January 02, 2023 showed slightly improved previously treated liver lesion, and additional area of concern in the liver which are difficult to compare with previous scan due to different timing of contrast.  No other evidence of disease progression.  Plan to obtain MRI as next scan.  I personally reviewed the CT scan images and discussed the findings with patient -Will continue Lenvima , he is tolerating well overall. -Due to his weight being above 60 kg now, I increased his Lenvima  to 12 mg daily in Feb 2024 -repeated liver MRI on 04/23/2023 showed stable treated liver lesion, L5 bone mets has slightly increased in size.  Patient developed worsening low back pain in July 2024, repeated lumbar MRI showed significant disease progression in the L5 bone lesion.  I personally reviewed the scan images and discussed the findings with patient. -I recommend palliative radiation to his L5 lesion for pain control -I recommend changing systemic treatment to immunotherapy, I discussed option of atezolizumab and bevacizumab, durvalumab  and Imjudo , nivo and ipi etc. since he previously progressed on oral TKI, I recommend him to proceed with durvalumab  and Imjudo . He started on 06/30/23 -the goal of therapy is curative/palliative to prolong life and prevent/improve cancer related symptoms. -he underwent  Bilateral laminotomies L5-S1 with decompression of the spinal canal and the L5 nerve roots epidural tumor by Dr. Ellery Guthrie On 11/20/2023 -he started SBRT on 12/15/2023

## 2024-03-10 ENCOUNTER — Encounter: Payer: Self-pay | Admitting: Nurse Practitioner

## 2024-03-10 ENCOUNTER — Inpatient Hospital Stay (HOSPITAL_BASED_OUTPATIENT_CLINIC_OR_DEPARTMENT_OTHER): Admitting: Hematology

## 2024-03-10 ENCOUNTER — Inpatient Hospital Stay

## 2024-03-10 ENCOUNTER — Encounter: Payer: Self-pay | Admitting: Hematology

## 2024-03-10 ENCOUNTER — Inpatient Hospital Stay (HOSPITAL_BASED_OUTPATIENT_CLINIC_OR_DEPARTMENT_OTHER): Admitting: Nurse Practitioner

## 2024-03-10 VITALS — BP 162/63 | HR 80 | Temp 97.6°F | Resp 17 | Ht 67.0 in | Wt 130.1 lb

## 2024-03-10 VITALS — BP 159/55 | HR 70 | Resp 16

## 2024-03-10 DIAGNOSIS — C220 Liver cell carcinoma: Secondary | ICD-10-CM | POA: Diagnosis not present

## 2024-03-10 DIAGNOSIS — C7951 Secondary malignant neoplasm of bone: Secondary | ICD-10-CM

## 2024-03-10 DIAGNOSIS — Z515 Encounter for palliative care: Secondary | ICD-10-CM

## 2024-03-10 DIAGNOSIS — K5900 Constipation, unspecified: Secondary | ICD-10-CM

## 2024-03-10 DIAGNOSIS — R530 Neoplastic (malignant) related fatigue: Secondary | ICD-10-CM | POA: Diagnosis not present

## 2024-03-10 DIAGNOSIS — G893 Neoplasm related pain (acute) (chronic): Secondary | ICD-10-CM | POA: Diagnosis not present

## 2024-03-10 LAB — CMP (CANCER CENTER ONLY)
ALT: 87 U/L — ABNORMAL HIGH (ref 0–44)
AST: 50 U/L — ABNORMAL HIGH (ref 15–41)
Albumin: 3.9 g/dL (ref 3.5–5.0)
Alkaline Phosphatase: 621 U/L — ABNORMAL HIGH (ref 38–126)
Anion gap: 7 (ref 5–15)
BUN: 10 mg/dL (ref 8–23)
CO2: 23 mmol/L (ref 22–32)
Calcium: 9.2 mg/dL (ref 8.9–10.3)
Chloride: 111 mmol/L (ref 98–111)
Creatinine: 0.86 mg/dL (ref 0.61–1.24)
GFR, Estimated: >60 mL/min (ref >60)
Glucose, Bld: 110 mg/dL — ABNORMAL HIGH (ref 70–99)
Potassium: 3.3 mmol/L — ABNORMAL LOW (ref 3.5–5.1)
Sodium: 141 mmol/L (ref 135–145)
Total Bilirubin: 0.3 mg/dL (ref 0.0–1.2)
Total Protein: 7.4 g/dL (ref 6.5–8.1)

## 2024-03-10 LAB — CBC WITH DIFFERENTIAL (CANCER CENTER ONLY)
Abs Immature Granulocytes: 0.02 10*3/uL (ref 0.00–0.07)
Basophils Absolute: 0.1 10*3/uL (ref 0.0–0.1)
Basophils Relative: 1 %
Eosinophils Absolute: 0.3 10*3/uL (ref 0.0–0.5)
Eosinophils Relative: 4 %
HCT: 32.5 % — ABNORMAL LOW (ref 39.0–52.0)
Hemoglobin: 10.7 g/dL — ABNORMAL LOW (ref 13.0–17.0)
Immature Granulocytes: 0 %
Lymphocytes Relative: 15 %
Lymphs Abs: 0.9 10*3/uL (ref 0.7–4.0)
MCH: 30.7 pg (ref 26.0–34.0)
MCHC: 32.9 g/dL (ref 30.0–36.0)
MCV: 93.1 fL (ref 80.0–100.0)
Monocytes Absolute: 0.5 10*3/uL (ref 0.1–1.0)
Monocytes Relative: 8 %
Neutro Abs: 4.3 10*3/uL (ref 1.7–7.7)
Neutrophils Relative %: 72 %
Platelet Count: 274 10*3/uL (ref 150–400)
RBC: 3.49 MIL/uL — ABNORMAL LOW (ref 4.22–5.81)
RDW: 13.1 % (ref 11.5–15.5)
WBC Count: 5.9 10*3/uL (ref 4.0–10.5)
nRBC: 0 % (ref 0.0–0.2)

## 2024-03-10 LAB — TSH: TSH: 1.09 u[IU]/mL (ref 0.350–4.500)

## 2024-03-10 MED ORDER — SODIUM CHLORIDE 0.9 % IV SOLN: Freq: Once | INTRAVENOUS | Status: AC

## 2024-03-10 MED ORDER — SODIUM CHLORIDE 0.9% FLUSH: 10.0000 mL | INTRAVENOUS | Status: DC | PRN

## 2024-03-10 MED ORDER — SODIUM CHLORIDE 0.9 % IV SOLN
1500.0000 mg | Freq: Once | INTRAVENOUS | Status: AC
  Administered 2024-03-10: 1500 mg via INTRAVENOUS
  Filled 2024-03-10: qty 30

## 2024-03-10 MED ORDER — HEPARIN SOD (PORK) LOCK FLUSH 100 UNIT/ML IV SOLN
500.0000 [IU] | Freq: Once | INTRAVENOUS | Status: DC | PRN
Start: 2024-03-10 — End: 2024-03-10

## 2024-03-10 NOTE — Progress Notes (Signed)
 Per Maryalice Smaller, MD, okay to treat with elevated AST (87)

## 2024-03-10 NOTE — Patient Instructions (Signed)

## 2024-03-10 NOTE — Progress Notes (Signed)
 [facility]   Telephone:([phone] Fax:([phone]) [phone]   Clinic Follow up Note   Patient Care Team: [name], MD as PCP - General (Internal Medicine) [name], MD as PCP - Cardiology (Cardiology) Sonja Dunn Loring, MD as Attending Physician (Hematology and Oncology) [name], Giles Labrum, NP as Nurse Practitioner [facility] and Palliative Medicine)  Date of Service:  03/10/2024  CHIEF COMPLAINT: f/u of HCC  CURRENT THERAPY:  Immunotherapy durvalumab  every 4 weeks  Oncology History   Hepatocellular carcinoma in adult [name]) diagnosed 11/2019 by MRI for f/u of cirrhosis. S/p microwave ablation on 03/01/20. -MRI 06/01/22 showed suspicion for local recurrence and metastasis at L5. Biopsy of L5 confirmed metastatic HCC. S/p repeat microwave ablation on 07/17/22. -started lenvima  08/16/22 -staging CT CAP and bone scan on 09/19/22 showed: interval enlargement of right hepatic lobe HCC; new retropulsed bony fragment at L5; no other new lesions. His previous CT was in 05/2022 and he did not start Lenvima  until 07/2022, so it's hard to evaluate if he is responding to Lenvima  or not, the progression is likely the nature course of his [name]  -Restaging CT scan from January 02, 2023 showed slightly improved previously treated liver lesion, and additional area of concern in the liver which are difficult to compare with previous scan due to different timing of contrast.  No other evidence of disease progression.  Plan to obtain MRI as next scan.  I personally reviewed the CT scan images and discussed the findings with patient -Will continue Lenvima , he is tolerating well overall. -Due to his weight being above 60 kg now, I increased his Lenvima  to 12 mg daily in Feb 2024 -repeated liver MRI on 04/23/2023 showed stable treated liver lesion, L5 bone mets has slightly increased in size.  Patient developed worsening low back pain in July 2024, repeated lumbar MRI showed significant  disease progression in the L5 bone lesion.  I personally reviewed the scan images and discussed the findings with patient. -I recommend palliative radiation to his L5 lesion for pain control -I recommend changing systemic treatment to immunotherapy, I discussed option of atezolizumab and bevacizumab, durvalumab  and Imjudo , nivo and ipi etc. since he previously progressed on oral TKI, I recommend him to proceed with durvalumab  and Imjudo . He started on 06/30/23 -the goal of therapy is curative/palliative to prolong life and prevent/improve cancer related symptoms. -he underwent Bilateral laminotomies L5-S1 with decompression of the spinal canal and the L5 nerve roots epidural tumor by Dr. Ellery Guthrie On 11/20/2023 -he received SBRT to low back in Feb 2025 -continue durvalumab      Assessment and Plan Assessment & Plan Hepatocellular carcinoma Hepatocellular carcinoma with well-managed disease on recent imaging. No new symptoms. Tolerating current treatment regimen well without significant side effects. Mild anemia likely secondary to previous treatment, no current need for transfusion. - Continue current treatment regimen - Repeat imaging in June  Breast mass, left side Newly identified left breast mass. Differential diagnosis includes breast cancer, though rare in males. Unlikely metastasis from hepatocellular carcinoma. Plan to evaluate with imaging and possible biopsy if indicated. 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 Uses a leg brace for support.  Hypertension Hypertension managed with hydralazine  and omicetin. No issues with current management.  Back pain Chronic back pain, worsened  by certain movements and positions. Managed with Tylenol . No new  interventions required.  Plan - Lab reviewed, adequate for treatment, will proceed durvalumab  today and continue every 4 weeks - Diagnostic mammogram and ultrasound of left breast and axilla in the next few weeks - Follow-up in 4 weeks   SUMMARY OF ONCOLOGIC HISTORY: Oncology History  Hepatocellular carcinoma in adult [facility])  12/08/2019 Imaging   MR ABDOMEN WWO CONTRAST   IMPRESSION: Two small masses in the right hepatic lobe which have characteristics diagnostic for hepatocellular carcinoma in the setting of cirrhosis. (LI-RADS Category 5: Definitely HCC)   No evidence of abdominal metastatic disease or other significant abnormality.   03/01/2020 Initial Diagnosis   Hepatocellular carcinoma (HCC)   03/01/2020 Procedure   CT GUIDE TISSUE ABLATION     IMPRESSION: Successful percutaneous thermal ablation.   Signed,   Roxie Cord, MD, RPVI   06/02/2020 Imaging   MR ABDOMEN WWO CONTRAST   IMPRESSION: 1. There has been interval ablation at two adjacent sites of the subcapsular right lobe of the liver, hepatic segment VI. There is no residual contrast enhancement at these adjacent sites. LI-RADS category 5 TR, nonviable. 2. There are scattered, subcentimeter foci of arterial hyperenhancement of the anterior right lobe of the liver, for example adjacent to the ablation site measuring 3 mm and in the medial anterior right lobe of the liver measuring 6 mm. This larger focus is unchanged compared to prior. Others were not previously seen. These findings are nonspecific and consistent with LI-RADS category 3. Attention on follow-up.   09/02/2020 Imaging   MR ABDOMEN WWO CONTRAST     IMPRESSION: 1. Post radiofrequency ablation along the margin of the RIGHT hemi liver. Heterogeneous appearance of this area on both T1 and T2. No sign of residual enhancement. LR TR nonviable. 2. Very subtle focus of restricted diffusion in the cephalad lateral segment LEFT hepatic  lobe measuring 7 mm, corresponding to low signal on image 27 of series 21. Arterial enhancement is not seen in this area and this areas not seen on prior imaging. Not clear whether this represents a new lesion as this area is obscured largely by artifact on most sequences on today's study and on previous imaging evaluations. LR category 3, consider a 3 to six-month follow-up with abdominal MRI. 3. Lesions of concern scattered about the RIGHT hepatic lobe may represent perfusional anomalies as they were not seen on today's study. 4. Stable mild dilation of the biliary tree.   11/27/2020 Imaging   MR ABDOMEN WWO CONTRAST   IMPRESSION: Minimal decrease in size of ablation defect in the peripheral anterior right hepatic lobe. No evidence of locally recurrent hepatocellular carcinoma or other suspicious liver lesions.   No evidence of metastatic disease or other acute findings.   04/03/2021 Imaging   MR ABDOMEN WWO CONTRAST   IMPRESSION: Stable ablation defect in the lateral right hepatic lobe, without evidence of recurrent tumor at this site.   5 mm hypovascular lesion in segment 5 of the right lobe was not definitely seen on previous study, but is too small to characterize. This shows no arterial phase hyperenhancement. Recommend continued follow-up by MRI in 6 months.   No evidence of abdominal metastatic disease.        09/03/2021 Imaging   MR ABDOMEN WWO CONTRAST   IMPRESSION: 1. Side-by-side ablation sites in the right hepatic lobe in segment 6. The more anterior site remains largely cystic in appearance and no findings suspicious recurrent tumor in  this area. There is however abnormal contrast enhancement around the larger more posterior and lateral ablation site worrisome for recurrent tumor. 2. Several small foci of early arterial phase enhancement in the liver. The peripheral lesions are likely vascular shunts. The more central lesions are likely dysplastic  nodules. Recommend continued surveillance. 3. Stable bilateral renal cysts.   12/07/2021 Imaging   MR ABDOMEN WWO CONTRAST  IMPRESSION: 1. Ablation defects in the right lobe of the liver, similar to the prior study, without definitive evidence to suggest local recurrence of disease or metastatic disease. 2. Multiple tiny arterial phase areas of nodular hyperenhancement scattered throughout the liver, predominantly in the right lobe of the liver, without perceptible signal abnormality or diffusion restriction on other pulse sequences, nonspecific, and likely small benign areas of vascular shunting. Continued attention on follow-up imaging is recommended. 3. Multiple Bosniak class 1 and Bosniak class 2 cysts in the kidneys bilaterally, similar to the prior study, as above. 4. Aortic atherosclerosis.     06/01/2022 Imaging   MR ABDOMEN WWO CONTRAST   IMPRESSION: 1. LR TR equivocal but suspicious findings with respect to previously treated disease in the RIGHT hepatic lobe given nodular enhancement adjacent and anterior superior to the ablation zone as described. 2. Given lack of late arterial phase due to bolus timing a second area of abnormality within the liver is suspicious for additional site of hepatocellular carcinoma showing washout appearance and capsule appearance on current imaging, cardiac pulsation limiting assessment in the area of concern. 3. Suspect bony metastatic disease in the lower lumbar spine at L5 level. Dedicated spinal imaging may be helpful for further evaluation.     06/18/2022 Imaging   MR Lumbar Spine W Wo Contrast   IMPRESSION: L5 vertebral body lesion is most consistent with a metastasis. There is probable trace ventral epidural extension   07/17/2022 Procedure   CT GUIDE TISSUE ABLATION   IMPRESSION: Successful CT-guided microwave ablation of recurrent hepatocellular carcinoma.   07/17/2022 Procedure   CT BONE TROCAR/NEEDLE BIOPSY SUPERFICIAL    IMPRESSION: Successful CT-guided core biopsy of L5 bone lesion.      07/17/2022 Pathology Results   SURGICAL PATHOLOGY  CASE: [id number]  PATIENT: PAO GHAN  Surgical Pathology Report   SURGICAL PATHOLOGY  CASE: [id number]  PATIENT: Luane Rumps  Surgical Pathology Report      Clinical History: Hx of cirrhosis and small HCC with unexpected  suspicious lesion at L5. Rare HCC met to bone vs atypical hemangioma or  met from new primary. (crm)      FINAL MICROSCOPIC DIAGNOSIS:   A. 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 No acute findings within the chest. No suspicious findings for metastatic disease within the chest. 2. Unchanged 3 mm nodule within the medial right upper lobe. No new or suspicious lung nodules. 3. Suboptimal visualization of the known liver lesions, reflecting lack of IV contrast material. Similar appearance of ablation defect within the lateral right lobe of liver. 4. Left renal calculi. 5. Coronary artery calcifications. 6.  Aortic Atherosclerosis   06/25/2023 Imaging   NM Bone Scan whole body  IMPRESSION: 1. Known L5 metastasis demonstrates low level uptake, mildly increased from previous bone scan. 2. No new lesions demonstrated.   06/30/2023 -  Chemotherapy   Patient is on Treatment Plan : Hepatocellular Carcinoma Tremelimumab -actl C1 D1 +  Durvalumab  q28d       Imaging        Discussed the use of AI scribe software for clinical note transcription with the patient, who gave verbal consent to proceed.  History of Present Illness Corey Curcio Sr. is a 68 year old male with hepatocellular carcinoma who presents for follow-up.  He completed radiation therapy in mid-February and reports improvement since then. He experiences soreness in his  back, particularly when bending, sitting for extended periods, or standing in certain positions, which he manages with Tylenol . He has mild anemia, likely from previous treatment, but does not require a blood transfusion. No significant side effects from his current treatment regimen.  He has a good appetite and stable weight with no recent weight loss. He is adjusting to walking with a leg brace due to nerve damage, which causes difficulty in controlling and lifting his leg, leading to discomfort in his lower back and hips with excessive movement. There is a family history of breast cancer, though specifics are unclear. No joint pain, skin rash, or other side effects from treatment.     All other systems were reviewed with the patient and are negative.  MEDICAL HISTORY:  Past Medical History:  Diagnosis Date   Headache    migraines   Hepatitis    HX OF hEP c ?    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    SURGICAL HISTORY: Past Surgical History:  Procedure Laterality Date   DECOMPRESSIVE LUMBAR LAMINECTOMY LEVEL 1 N/A 11/20/2023   Procedure: lumbar laminectomy right Lumbar five with decompression of nerve roots and excision of tumor;  Surgeon: Elna Haggis, MD;  Location[facility] OR;  Service: Neurosurgery;  Laterality: N/A;   IR RADIOLOGIST EVAL & MGMT  01/25/2020   IR RADIOLOGIST EVAL & MGMT  03/21/2020   IR RADIOLOGIST EVAL & MGMT  06/07/2020   IR RADIOLOGIST EVAL & MGMT  09/05/2020   IR RADIOLOGIST EVAL & MGMT  12/05/2020   IR RADIOLOGIST EVAL & MGMT  05/09/2021   IR RADIOLOGIST EVAL & MGMT  09/06/2021   IR RADIOLOGIST EVAL & MGMT  12/12/2021   IR RADIOLOGIST EVAL & MGMT  06/25/2022   IR RADIOLOGIST EVAL & MGMT  07/24/2022   RADIOLOGY WITH ANESTHESIA N/A 03/01/2020   Procedure: CT WITH ANESTHESIA  THERMAL MICROWAVE  ABLATIION;  Surgeon: Fernando Hoyer, MD;  Location: [facility] ORS;  Service: Anesthesiology;  Laterality: N/A;   RADIOLOGY WITH ANESTHESIA N/A  07/17/2022   Procedure: CT MICROWAVE ABLATION;  Surgeon: Roxie Cord, MD;  Location: [facility] ORS;  Service: Radiology;  Laterality: N/A;    I have reviewed the social history and family history with the patient and they are unchanged from previous note.  ALLERGIES:  has no known allergies.  MEDICATIONS:  Current Outpatient Medications  Medication Sig Dispense Refill   acetaminophen  (TYLENOL ) 500 MG tablet Take 1,000 mg by mouth every 6 (six) hours as needed.     aspirin  EC 81 MG tablet Take 1 tablet (81 mg total) by mouth daily. Swallow whole. 30 tablet 12   atorvastatin  (LIPITOR) 40 MG tablet Take 1 tablet (40 mg total) by mouth daily. 30 tablet 11   hydrALAZINE  (APRESOLINE ) 10 MG tablet TAKE 1 TABLET 2 (TWO) TIMES DAILY AS NEEDED. TAKE FOR SYSTOLIC BLOOD PRESSURE MORE THAN 170. 180 tablet 3   olmesartan  (BENICAR ) 20 MG tablet Take 1 tablet (20 mg total) by mouth daily. 30  tablet 11   No current facility-administered medications for this visit.   Facility-Administered Medications Ordered in Other Visits  Medication Dose Route Frequency Provider Last Rate Last Admin   heparin  lock flush 100 unit/mL  500 Units Intracatheter Once PRN Sonja [place], MD       sodium chloride  flush (NS) 0.9 % injection 10 mL  10 mL Intracatheter PRN Sonja Meriden, MD        PHYSICAL EXAMINATION: ECOG PERFORMANCE STATUS: 1 - Symptomatic but completely ambulatory  Vitals:   03/10/24 1125 03/10/24 1127  BP: (!) 154/62 (!) 162/63  Pulse: 80   Resp: 17   Temp: 97.6 F (36.4 C)   SpO2: 100%    Wt Readings from Last 3 Encounters:  03/10/24 130 lb 2 oz (59 kg)  02/11/24 129 lb 8 oz (58.7 kg)  01/14/24 128 lb 9.6 oz (58.3 kg)     GENERAL:alert, no distress and comfortable SKIN: skin color, texture, turgor are normal, no rashes or significant lesions EYES: normal, Conjunctiva are pink and non-injected, sclera clear NECK: supple, thyroid  normal size, non-tender, without nodularity LYMPH:  no palpable  lymphadenopathy in the cervical, axillary  LUNGS: clear to auscultation and percussion with normal breathing effort HEART: regular rate & rhythm and no murmurs and no lower extremity edema ABDOMEN:abdomen soft, non-tender and normal bowel sounds Musculoskeletal:no cyanosis of digits and no clubbing  NEURO: alert & oriented x 3 with fluent speech, no focal motor/sensory deficits Breasts: Breast inspection showed them to be symmetrical with no nipple discharge. Palpation of the breasts and axilla revealed a 2cm mass behind left nipple and a small palpable axillary nodes on left, no obvious mass that I could appreciate on right breast.  Physical Exam   LABORATORY DATA:  I have reviewed the data as listed    Latest Ref Rng & Units 03/10/2024   11:02 AM 02/11/2024    1:07 PM 01/14/2024   11:12 AM  CBC  WBC 4.0 - 10.5 K/uL 5.9  5.0  4.2   Hemoglobin 13.0 - 17.0 g/dL 16.1  09.6  04.5   Hematocrit 39.0 - 52.0 % 32.5  33.8  35.1   Platelets 150 - 400 K/uL 274  237  202         Latest Ref Rng & Units 03/10/2024   11:02 AM 02/11/2024    1:07 PM 01/14/2024   11:12 AM  CMP  Glucose 70 - 99 mg/dL 409  93  811   BUN 8 - 23 mg/dL 10  13  23    Creatinine 0.61 - 1.24 mg/dL 9.14  7.82  9.56   Sodium 135 - 145 mmol/L 141  140  140   Potassium 3.5 - 5.1 mmol/L 3.3  4.0  3.9   Chloride 98 - 111 mmol/L 111  108  109   CO2 22 - 32 mmol/L 23  25  24    Calcium  8.9 - 10.3 mg/dL 9.2  9.8  9.7   Total Protein 6.5 - 8.1 g/dL 7.4  8.1  8.0   Total Bilirubin 0.0 - 1.2 mg/dL 0.3  0.3  0.4   Alkaline Phos 38 - 126 U/L 621  108  72   AST 15 - 41 U/L 50  17  16   ALT 0 - 44 U/L 87  17  19       RADIOGRAPHIC STUDIES: I have personally reviewed the radiological images as listed and agreed with the findings in the report. No results found.  Orders Placed This Encounter  Procedures   MM 3D DIAGNOSTIC MAMMOGRAM UNILATERAL LEFT BREAST    Standing Status:   Future    Expected Date:   03/17/2024    Expiration  Date:   03/10/2025    Reason for Exam (SYMPTOM  OR DIAGNOSIS REQUIRED):   left breast mass, rule out malignancy    Preferred imaging location?:   [facility]   US  LIMITED ULTRASOUND INCLUDING AXILLA LEFT BREAST     Standing Status:   Future    Expected Date:   03/17/2024    Expiration Date:   03/10/2025    Reason for Exam (SYMPTOM  OR DIAGNOSIS REQUIRED):   left breast mass and axillary adenopathy (samll)    Preferred Imaging Location?:   [facility]   All questions were answered. The patient knows to call the clinic with any problems, questions or concerns. No barriers to learning was detected. The total time spent in the appointment was 25 minutes.     Sonja Stevens, MD 03/10/2024

## 2024-03-10 NOTE — Progress Notes (Signed)
 Palliative Medicine [facility]  [phone]) [phone] [phone]   Name: Corey Sudler Sr. Date: 03/10/2024 MRN: [id number]  DOB: June 17, 1956  Patient Care Team: [name], MD as PCP - General (Internal Medicine) [name], MD as PCP - Cardiology (Cardiology) Sonja Capulin, MD as Attending Physician (Hematology and Oncology) [name], Giles Labrum, NP as Nurse Practitioner (Hospice and Palliative Medicine)    INTERVAL HISTORY: Blayz Oclair Sr. is a 68 y.o. 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 Palliative ask to see for symptom and pain management and goals of care.   SOCIAL HISTORY:     reports that he has quit smoking. He has been exposed to tobacco smoke. He has never used smokeless tobacco. He reports that he does not currently use alcohol. He reports that he does not use drugs.  ADVANCE DIRECTIVES:  None on file   CODE STATUS: Full code  PAST MEDICAL HISTORY: Past Medical History:  Diagnosis Date   Headache    migraines   Hepatitis    HX OF hEP c ?    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    ALLERGIES:  has no known allergies.  MEDICATIONS:  Current Outpatient Medications  Medication Sig Dispense Refill   acetaminophen  (TYLENOL ) 500 MG tablet Take 1,000 mg by mouth every 6 (six) hours as needed.     aspirin  EC 81 MG tablet Take 1 tablet (81 mg total) by mouth daily. Swallow whole. 30 tablet 12   atorvastatin  (LIPITOR) 40 MG tablet Take 1 tablet (40 mg total) by mouth daily. 30 tablet 11   hydrALAZINE  (APRESOLINE ) 10 MG tablet TAKE 1 TABLET 2 (TWO) TIMES DAILY AS NEEDED. TAKE FOR SYSTOLIC BLOOD PRESSURE MORE THAN 170. 180 tablet 3   olmesartan  (BENICAR ) 20 MG tablet Take 1 tablet (20 mg total) by mouth daily. 30 tablet 11   No current facility-administered medications for this visit.    Facility-Administered Medications Ordered in Other Visits  Medication Dose Route Frequency Provider Last Rate Last Admin   heparin  lock flush 100 unit/mL  500 Units Intracatheter Once PRN Sonja Trenton, MD       sodium chloride  flush (NS) 0.9 % injection 10 mL  10 mL Intracatheter PRN Sonja Falun, MD        VITAL SIGNS: There were no vitals taken for this visit. There were no vitals filed for this visit.  Estimated body mass index is 20.38 kg/m as calculated from the following:   Height as of an earlier encounter on 03/10/24: 5\' 7"  (1.702 m).   Weight as of an earlier encounter on 03/10/24: 130 lb 2 oz (59 kg).   PERFORMANCE STATUS (ECOG) : 1 - Symptomatic but completely ambulatory   Physical Exam General: NAD Cardiovascular: regular rate and rhythm Pulmonary: normal breathing pattern Extremities: no edema, no joint deformities Skin: no rashes Neurological: AAO x3  IMPRESSION: Discussed the use of AI scribe software for clinical note transcription with the patient, who gave verbal consent to proceed.  History of Present Illness Corey Seymour Sr. is a 68 year old male who presents with back pain and sleep disturbances. No family present. Is trying to remain as active as possible. Denies concerns with nausea or vomiting. He reports issues with bowel movements, initially experiencing constipation and then diarrhea. He took his stool softener for constipation which caused severe stomach cramps,  leading him to discontinue it. He now feels his bowel movements are back to regular. He notes low energy levels, feeling tired quickly, especially when walking. He has not returned to work yet and plans to retire in a few months. His appetite is good.  He experiences sleep disturbances, stating 'I don't sleep good.' He goes to bed early but has difficulty staying asleep, often tossing and turning throughout the night. He has tried Benadryl for sleep, but it made him feel 'hyper". Education  provided on the use of melatonin 30-45 min prior to bedtime.   Mr. Melhado has persistent back pain described as 'sore like a pin stabbing'. He uses a cane to assist with walking but is attempting to walk without it to assess his mobility. He takes Tylenol , 650 mg, two tablets at a time, for pain relief but notes that the pain persists. He has not taken any pain medication since the surgery until recently and does not have any oxycodone  at home. We discussed the use of gabapentin  300mg  every 8 hours as needed for episodes of severe pain. He confirms that he still has medications on hand in the home. We will continue to closely monitor.   All questions answered and support provided.   I discussed the importance of continued conversation with family and their medical providers regarding overall plan of care and treatment options, ensuring decisions are within the context of the patients values and GOCs.  Assessment and Plan Assessment & Plan Back pain Persistent back pain managed with acetaminophen . Gabapentin  suggested for breakthrough pain. - Continue acetaminophen . - Use gabapentin  as needed for severe pain.  Fatigue Low energy levels possibly related to sleep disturbance and recent surgery.  Sleep disturbance Difficulty sleeping with frequent awakenings. Diphenhydramine caused adverse effects. Melatonin discussed as alternative. - Recommend melatonin gummies, start with 5 mg, 30-45 minutes before bedtime. Increase to 10 mg if ineffective. - Consider prescription sleep aid if melatonin is ineffective.  Patient expressed understanding and was in agreement with this plan. He also understands that He can call the clinic at any time with any questions, concerns, or complaints.   Any controlled substances utilized were prescribed in the context of palliative care. PDMP has been reviewed.   Visit consisted of counseling and education dealing with the complex and emotionally intense issues of  symptom management and palliative care in the setting of serious and potentially life-threatening illness.  [name], AGPCNP-BC  Palliative Medicine Team/[facility]

## 2024-03-11 LAB — T4: T4, Total: 9 ug/dL (ref 4.5–12.0)

## 2024-03-12 ENCOUNTER — Other Ambulatory Visit: Payer: Self-pay

## 2024-03-12 NOTE — Progress Notes (Signed)
 Orders for Mammogram & US  of pt's left breast sent to [facility].  Requested appt by 03/17/2024 and for GI to please contact pt to get him scheduled.  Fax confirmation received.

## 2024-03-16 ENCOUNTER — Ambulatory Visit (HOSPITAL_BASED_OUTPATIENT_CLINIC_OR_DEPARTMENT_OTHER): Payer: Commercial Managed Care - PPO | Admitting: Family

## 2024-03-16 ENCOUNTER — Encounter (HOSPITAL_BASED_OUTPATIENT_CLINIC_OR_DEPARTMENT_OTHER): Payer: Self-pay | Admitting: Family

## 2024-03-16 ENCOUNTER — Other Ambulatory Visit: Payer: Self-pay | Admitting: Hematology

## 2024-03-16 VITALS — BP 138/52 | HR 78 | Ht 67.0 in | Wt 128.3 lb

## 2024-03-16 DIAGNOSIS — C220 Liver cell carcinoma: Secondary | ICD-10-CM

## 2024-03-16 DIAGNOSIS — I10 Essential (primary) hypertension: Secondary | ICD-10-CM

## 2024-03-16 DIAGNOSIS — I350 Nonrheumatic aortic (valve) stenosis: Secondary | ICD-10-CM | POA: Diagnosis not present

## 2024-03-16 DIAGNOSIS — N6320 Unspecified lump in the left breast, unspecified quadrant: Secondary | ICD-10-CM

## 2024-03-16 DIAGNOSIS — R590 Localized enlarged lymph nodes: Secondary | ICD-10-CM

## 2024-03-16 DIAGNOSIS — Z8673 Personal history of transient ischemic attack (TIA), and cerebral infarction without residual deficits: Secondary | ICD-10-CM | POA: Diagnosis not present

## 2024-03-16 DIAGNOSIS — E785 Hyperlipidemia, unspecified: Secondary | ICD-10-CM

## 2024-03-16 NOTE — Patient Instructions (Signed)
 Medication Instructions:  Your physician recommends that you continue on your current medications as directed. Please refer to the Current Medication list given to you today.  Recommend Trying Flonase 1 time per day   Recommend switching from Claritin to Xyzal 1 time per day   Follow-Up:  Please follow up in September with Dr. Theodis Fiscal, Slater Duncan, NP or Neomi Banks, NP

## 2024-03-16 NOTE — Progress Notes (Signed)
 Cardiology Office Note:  .   Date:  03/16/2024  ID:  Corey Gillis Sr., DOB 1956-03-26, MRN [id number] PCP: [name], MD  [facility] Providers Cardiologist:  [name], MD    History of Present Illness: .   Corey Rathgeb Sr. is a 68 y.o. 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 Ruthann Cover office for potential surgical fixation of pathological L5 fracture.Seen in consult 11/19/2023 due to severe aortic stenosis.  Noted murmur since childhood.  Due to hypotension his losartan -HCTZ was discontinued.  Echocardiogram 11/19/2023 LVEF 60 to 65%, no RWMA, grade 2 diastolic dysfunction, severe aortic stenosis.  Difficult to ascertain symptomology of aortic stenosis is very physically limited due to back pain and right lower extremity pain and weakness related to his cancer.  Given metastatic HCC he was not candidate for TAVR or SAVR.  He underwent bilateral laminotomies L5-S1 with decompression of the spinal canal and L5 nerve roots epidural tumor by Dr. Ellery Guthrie 11/20/2023.  Admitted 1/27 - 12/01/2023 with concern for possible strokelike symptoms including dizziness, difficulty word finding.  CT MRI with no acute stroke.  He was admitted for TIA workup.  Symptoms and finally resolved.  He was started on atorvastatin .  Seen in follow-up 12/23/2023 with BP initially 110/58 with repeat 130/62.  Discussed BP goal less than 140/90 given severe aortic stenosis, confirmed with Dr. Theodis Fiscal.  He was given hydralazine  10 mg to take as needed for SBP greater than 170.  Presents today for follow-up with his wife.  Feeling overall well since last seen.  Average systolic blood pressure at home for the month of May of 149.  Notes checking approximately 30 minutes after taking his olmesartan  in the morning.  He is required hydralazine  as needed  about twice per week which does improve his blood pressure.  Reports stable mild exertional dyspnea.  Reports no near-syncope, syncope, chest pain, edema.  He does note persistent allergy symptoms with nasal congestion despite Claritin daily.  ROS: Please see the history of present illness.    All other systems reviewed and are negative.   Studies Reviewed: .        Cardiac Studies & Procedures   ______________________________________________________________________________________________     ECHOCARDIOGRAM  ECHOCARDIOGRAM COMPLETE 11/19/2023  Narrative ECHOCARDIOGRAM REPORT    Patient Name:   Corey Brouillet Sr. Date of Exam: 11/19/2023 Medical Rec #:  [id number]                  Height:       67.0 in Accession #:    [id number]                 Weight:       129.3 lb Date of Birth:  September 26, 1956                   BSA:          1.680 m Patient Age:    67 years                   BP:           136/50 mmHg Patient Gender: M                          HR:  53 bpm. Exam Location:  Inpatient  Procedure: 2D Echo, Color Doppler and Cardiac Doppler  Indications:    murmur  History:        Patient has no prior history of Echocardiogram examinations. Risk Factors:Hypertension.  Sonographer:    Melissa Morford RDCS (AE, PE) Referring Phys: [id number] ANAND D HONGALGI  IMPRESSIONS   1. Left ventricular ejection fraction, by estimation, is 60 to 65%. The left ventricle has normal function. The left ventricle has no regional wall motion abnormalities. There is moderate concentric left ventricular hypertrophy. Left ventricular diastolic parameters are consistent with Grade II diastolic dysfunction (pseudonormalization). 2. Right ventricular systolic function is normal. The right ventricular size is normal. There is normal pulmonary artery systolic pressure. 3. Left atrial size was mildly dilated. 4. Mild mitral valve regurgitation. 5. Unable to determine if this is a bicupsid valve.  AI directed towards the anterior leaflet. The aortic valve is calcified. Aortic valve regurgitation is moderate. Severe aortic valve stenosis. Aortic valve area, by VTI measures 0.85 cm. Aortic valve mean gradient measures 41.0 mmHg. 6. The inferior vena cava is normal in size with greater than 50% respiratory variability, suggesting right atrial pressure of 3 mmHg.  Conclusion(s)/Recommendation(s): Recommend cardiac CT for severe AS.  FINDINGS Left Ventricle: Left ventricular ejection fraction, by estimation, is 60 to 65%. The left ventricle has normal function. The left ventricle has no regional wall motion abnormalities. The left ventricular internal cavity size was normal in size. There is moderate concentric left ventricular hypertrophy. Left ventricular diastolic parameters are consistent with Grade II diastolic dysfunction (pseudonormalization).  Right Ventricle: The right ventricular size is normal. Right ventricular systolic function is normal. There is normal pulmonary artery systolic pressure. The tricuspid regurgitant velocity is 2.32 m/s, and with an assumed right atrial pressure of 3 mmHg, the estimated right ventricular systolic pressure is 24.5 mmHg.  Left Atrium: Left atrial size was mildly dilated.  Right Atrium: Right atrial size was normal in size.  Pericardium: There is no evidence of pericardial effusion.  Mitral Valve: Mild mitral annular calcification. Mild mitral valve regurgitation.  Tricuspid Valve: Tricuspid valve regurgitation is mild.  Aortic Valve: Unable to determine if this is a bicupsid valve. AI directed towards the anterior leaflet. The aortic valve is calcified. Aortic valve regurgitation is moderate. Severe aortic stenosis is present. Aortic valve mean gradient measures 41.0 mmHg. Aortic valve peak gradient measures 17.3 mmHg. Aortic valve area, by VTI measures 0.85 cm.  Pulmonic Valve: Pulmonic valve regurgitation is trivial.  Aorta: The aortic  root and ascending aorta are structurally normal, with no evidence of dilitation.  Venous: The inferior vena cava is normal in size with greater than 50% respiratory variability, suggesting right atrial pressure of 3 mmHg.  IAS/Shunts: No atrial level shunt detected by color flow Doppler.   LEFT VENTRICLE PLAX 2D LVIDd:         4.90 cm      Diastology LVIDs:         3.10 cm      LV e' medial:    6.31 cm/s LV PW:         1.20 cm      LV E/e' medial:  15.4 LV IVS:        1.20 cm      LV e' lateral:   7.07 cm/s LVOT diam:     2.00 cm      LV E/e' lateral: 13.7 LV SV:         89  LV SV Index:   53 LVOT Area:     3.14 cm  LV Volumes (MOD) LV vol d, MOD A2C: 112.0 ml LV vol d, MOD A4C: 123.0 ml LV vol s, MOD A2C: 51.3 ml LV vol s, MOD A4C: 58.6 ml LV SV MOD A2C:     60.7 ml LV SV MOD A4C:     123.0 ml LV SV MOD BP:      62.5 ml  RIGHT VENTRICLE RV S prime:     12.40 cm/s TAPSE (M-mode): 2.6 cm  LEFT ATRIUM           Index        RIGHT ATRIUM           Index LA diam:      3.60 cm 2.14 cm/m   RA Area:     15.00 cm LA Vol (A4C): 59.3 ml 35.30 ml/m  RA Volume:   41.90 ml  24.94 ml/m AORTIC VALVE AV Area (Vmax):    1.92 cm AV Area (Vmean):   0.86 cm AV Area (VTI):     0.85 cm AV Vmax:           207.94 cm/s AV Vmean:          300.000 cm/s AV VTI:            1.050 m AV Peak Grad:      17.3 mmHg AV Mean Grad:      41.0 mmHg LVOT Vmax:         127.00 cm/s LVOT Vmean:        81.900 cm/s LVOT VTI:          0.284 m LVOT/AV VTI ratio: 0.27  AORTA Ao Root diam: 3.60 cm Ao Asc diam:  3.40 cm  MITRAL VALVE               TRICUSPID VALVE MV Area (PHT): 3.06 cm    TR Peak grad:   21.5 mmHg MV Decel Time: 248 msec    TR Vmax:        232.00 cm/s MV E velocity: 96.90 cm/s MV A velocity: 82.60 cm/s  SHUNTS MV E/A ratio:  1.17        Systemic VTI:  0.28 m Systemic Diam: 2.00 cm  Corey Armstrong Electronically signed by Corey Armstrong Signature Date/Time: 11/19/2023/1:17:02  PM    Final          ______________________________________________________________________________________________      Risk Assessment/Calculations:             Physical Exam:   VS:  BP (!) 138/52   Pulse 78   Ht 5\' 7"  (1.702 m)   Wt 128 lb 4.8 oz (58.2 kg)   BMI 20.09 kg/m    Wt Readings from Last 3 Encounters:  03/16/24 128 lb 4.8 oz (58.2 kg)  03/10/24 130 lb 2 oz (59 kg)  02/11/24 129 lb 8 oz (58.7 kg)    Vitals:   03/16/24 1020 03/16/24 1034  BP: (!) 162/64 (!) 138/52  Pulse: 78   Height: 5\' 7"  (1.702 m)   Weight: 128 lb 4.8 oz (58.2 kg)   BMI (Calculated): 20.09     GEN: Well nourished, well developed in no acute distress NECK: No JVD; No carotid bruits CARDIAC: RRR, no  rubs, gallops. Gr3-4/6 systolic murmur LUSB RESPIRATORY:  Clear to auscultation without rales, wheezing or rhonchi  ABDOMEN: Soft, non-tender, non-distended EXTREMITIES:  No edema; No deformity   ASSESSMENT AND PLAN: .  HTN -initial BP in clinic 162/64 which improved to 138/52 without intervention.  BP goal less than 140/90, previously confirmed with Dr. Theodis Fiscal, due to severe aortic stenosis.  Average systolic blood pressure at home of 149 taken 30 minutes after his olmesartan  20 mg daily.  He is using hydralazine  10 mg as needed for SBP greater than 170 and requiring approximately twice per week.  Encouraged to check blood pressure at least an hour after taking his morning medications for most accurate measurement.  If blood pressure routinely greater than 140 by home readings he will contact us  and could consider increasing dose of olmesartan .  Severe AS / Bicuspid AV-not TAVR or SAVR candidate due to [facility].  Previously dscussed screening of first-degree relatives with echocardiogram. Euvolemic and well compensated on exam with no evidence of heart failure.   [facility] -Per oncology and palliative team.  Allergic rhinitis - Recommend trial of Xyzal daily as Claritin ineffective. Recommend  trial of Flonase one spray per nostril daily PRN for allergy symptoms.   Hx of TIA / HLD-continue aspirin  81 mg daily,  atorvastatin  40 mg daily.  12/01/2023 LDL 123.  Will inquire if oncology team can collect lipid panel with upcoming labs 04/07/2024 to reassess LDL after being on atorvastatin .      Dispo: follow up in 4 months  Signed, Clearnce Curia, NP

## 2024-03-17 ENCOUNTER — Ambulatory Visit
Admission: RE | Admit: 2024-03-17 | Discharge: 2024-03-17 | Disposition: A | Discharge status: Home / Self Care | Source: Ambulatory Visit | Attending: Hematology | Admitting: Hematology

## 2024-03-17 DIAGNOSIS — C220 Liver cell carcinoma: Secondary | ICD-10-CM

## 2024-03-17 DIAGNOSIS — N6320 Unspecified lump in the left breast, unspecified quadrant: Secondary | ICD-10-CM

## 2024-03-17 DIAGNOSIS — R590 Localized enlarged lymph nodes: Secondary | ICD-10-CM

## 2024-03-18 ENCOUNTER — Other Ambulatory Visit: Payer: Self-pay | Admitting: Hematology

## 2024-03-18 DIAGNOSIS — N6320 Unspecified lump in the left breast, unspecified quadrant: Secondary | ICD-10-CM

## 2024-03-19 ENCOUNTER — Other Ambulatory Visit

## 2024-03-22 ENCOUNTER — Encounter (HOSPITAL_COMMUNITY): Payer: Self-pay

## 2024-03-26 ENCOUNTER — Ambulatory Visit
Admission: RE | Admit: 2024-03-26 | Discharge: 2024-03-26 | Disposition: A | Discharge status: Home / Self Care | Source: Ambulatory Visit | Attending: Hematology | Admitting: Hematology

## 2024-03-26 DIAGNOSIS — N6320 Unspecified lump in the left breast, unspecified quadrant: Secondary | ICD-10-CM

## 2024-03-29 ENCOUNTER — Other Ambulatory Visit

## 2024-03-29 LAB — SURGICAL PATHOLOGY

## 2024-04-06 NOTE — Assessment & Plan Note (Signed)
 diagnosed 11/2019 by MRI for f/u of cirrhosis. S/p microwave ablation on 03/01/20. -MRI 06/01/22 showed suspicion for local recurrence and metastasis at L5. Biopsy of L5 confirmed metastatic HCC. S/p repeat microwave ablation on 07/17/22. -started lenvima  08/16/22 -staging CT CAP and bone scan on 09/19/22 showed: interval enlargement of right hepatic lobe HCC; new retropulsed bony fragment at L5; no other new lesions. His previous CT was in 05/2022 and he did not start Lenvima  until 07/2022, so it's hard to evaluate if he is responding to Lenvima  or not, the progression is likely the nature course of his [facility]  -Restaging CT scan from January 02, 2023 showed slightly improved previously treated liver lesion, and additional area of concern in the liver which are difficult to compare with previous scan due to different timing of contrast.  No other evidence of disease progression.  Plan to obtain MRI as next scan.  I personally reviewed the CT scan images and discussed the findings with patient -Will continue Lenvima , he is tolerating well overall. -Due to his weight being above 60 kg now, I increased his Lenvima  to 12 mg daily in Feb 2024 -repeated liver MRI on 04/23/2023 showed stable treated liver lesion, L5 bone mets has slightly increased in size.  Patient developed worsening low back pain in July 2024, repeated lumbar MRI showed significant disease progression in the L5 bone lesion.  I personally reviewed the scan images and discussed the findings with patient. -I recommend palliative radiation to his L5 lesion for pain control -I recommend changing systemic treatment to immunotherapy, I discussed option of atezolizumab and bevacizumab, durvalumab  and Imjudo , nivo and ipi etc. since he previously progressed on oral TKI, I recommend him to proceed with durvalumab  and Imjudo . He started on 06/30/23 -the goal of therapy is curative/palliative to prolong life and prevent/improve cancer related symptoms. -he underwent  Bilateral laminotomies L5-S1 with decompression of the spinal canal and the L5 nerve roots epidural tumor by Dr. Ellery Guthrie On 11/20/2023 -he started SBRT on 12/15/2023

## 2024-04-07 ENCOUNTER — Inpatient Hospital Stay: Attending: Hematology | Admitting: Hematology

## 2024-04-07 ENCOUNTER — Encounter: Payer: Self-pay | Admitting: *Deleted

## 2024-04-07 ENCOUNTER — Encounter: Payer: Self-pay | Admitting: Hematology

## 2024-04-07 ENCOUNTER — Encounter: Payer: Self-pay | Admitting: Nurse Practitioner

## 2024-04-07 ENCOUNTER — Ambulatory Visit (HOSPITAL_COMMUNITY)
Admission: RE | Admit: 2024-04-07 | Discharge: 2024-04-07 | Disposition: A | Discharge status: Home / Self Care | Source: Ambulatory Visit | Attending: Hematology

## 2024-04-07 ENCOUNTER — Other Ambulatory Visit: Payer: Self-pay

## 2024-04-07 ENCOUNTER — Inpatient Hospital Stay (HOSPITAL_BASED_OUTPATIENT_CLINIC_OR_DEPARTMENT_OTHER): Admitting: Nurse Practitioner

## 2024-04-07 ENCOUNTER — Inpatient Hospital Stay

## 2024-04-07 VITALS — BP 140/63 | HR 77 | Temp 98.3°F | Resp 17 | Ht 67.0 in | Wt 128.2 lb

## 2024-04-07 DIAGNOSIS — Z7982 Long term (current) use of aspirin: Secondary | ICD-10-CM | POA: Diagnosis not present

## 2024-04-07 DIAGNOSIS — Z79899 Other long term (current) drug therapy: Secondary | ICD-10-CM | POA: Insufficient documentation

## 2024-04-07 DIAGNOSIS — Z515 Encounter for palliative care: Secondary | ICD-10-CM | POA: Diagnosis not present

## 2024-04-07 DIAGNOSIS — Z7722 Contact with and (suspected) exposure to environmental tobacco smoke (acute) (chronic): Secondary | ICD-10-CM | POA: Diagnosis not present

## 2024-04-07 DIAGNOSIS — C7951 Secondary malignant neoplasm of bone: Secondary | ICD-10-CM | POA: Insufficient documentation

## 2024-04-07 DIAGNOSIS — G893 Neoplasm related pain (acute) (chronic): Secondary | ICD-10-CM | POA: Diagnosis not present

## 2024-04-07 DIAGNOSIS — I10 Essential (primary) hypertension: Secondary | ICD-10-CM | POA: Diagnosis not present

## 2024-04-07 DIAGNOSIS — C220 Liver cell carcinoma: Secondary | ICD-10-CM

## 2024-04-07 DIAGNOSIS — R530 Neoplastic (malignant) related fatigue: Secondary | ICD-10-CM

## 2024-04-07 DIAGNOSIS — Z87891 Personal history of nicotine dependence: Secondary | ICD-10-CM | POA: Diagnosis not present

## 2024-04-07 LAB — CBC WITH DIFFERENTIAL (CANCER CENTER ONLY)
Abs Immature Granulocytes: 0.02 10*3/uL (ref 0.00–0.07)
Basophils Absolute: 0 10*3/uL (ref 0.0–0.1)
Basophils Relative: 1 %
Eosinophils Absolute: 0.2 10*3/uL (ref 0.0–0.5)
Eosinophils Relative: 3 %
HCT: 31.5 % — ABNORMAL LOW (ref 39.0–52.0)
Hemoglobin: 10.6 g/dL — ABNORMAL LOW (ref 13.0–17.0)
Immature Granulocytes: 0 %
Lymphocytes Relative: 17 %
Lymphs Abs: 0.8 10*3/uL (ref 0.7–4.0)
MCH: 30.1 pg (ref 26.0–34.0)
MCHC: 33.7 g/dL (ref 30.0–36.0)
MCV: 89.5 fL (ref 80.0–100.0)
Monocytes Absolute: 0.4 10*3/uL (ref 0.1–1.0)
Monocytes Relative: 8 %
Neutro Abs: 3.2 10*3/uL (ref 1.7–7.7)
Neutrophils Relative %: 71 %
Platelet Count: 213 10*3/uL (ref 150–400)
RBC: 3.52 MIL/uL — ABNORMAL LOW (ref 4.22–5.81)
RDW: 15.4 % (ref 11.5–15.5)
WBC Count: 4.6 10*3/uL (ref 4.0–10.5)
nRBC: 0 % (ref 0.0–0.2)

## 2024-04-07 LAB — CMP (CANCER CENTER ONLY)
ALT: 281 U/L (ref 0–44)
AST: 454 U/L (ref 15–41)
Albumin: 3.2 g/dL — ABNORMAL LOW (ref 3.5–5.0)
Alkaline Phosphatase: 1328 U/L — ABNORMAL HIGH (ref 38–126)
Anion gap: 10 (ref 5–15)
BUN: 11 mg/dL (ref 8–23)
CO2: 21 mmol/L — ABNORMAL LOW (ref 22–32)
Calcium: 9 mg/dL (ref 8.9–10.3)
Chloride: 107 mmol/L (ref 98–111)
Creatinine: 0.85 mg/dL (ref 0.61–1.24)
GFR, Estimated: >60 mL/min (ref >60)
Glucose, Bld: 135 mg/dL — ABNORMAL HIGH (ref 70–99)
Potassium: 3.5 mmol/L (ref 3.5–5.1)
Sodium: 138 mmol/L (ref 135–145)
Total Bilirubin: 2.2 mg/dL — ABNORMAL HIGH (ref 0.0–1.2)
Total Protein: 7.3 g/dL (ref 6.5–8.1)

## 2024-04-07 LAB — TSH: TSH: 1.82 u[IU]/mL (ref 0.350–4.500)

## 2024-04-07 NOTE — Progress Notes (Signed)
 [facility]   [phone] Fax:([phone]) [phone]   Clinic Follow up Note   Patient Care Team: [name], MD as PCP - General (Internal Medicine) [name], MD as PCP - Cardiology (Cardiology) [name], MD as Attending Physician (Hematology and Oncology) [name], Giles Labrum, NP as Nurse Practitioner (Hospice and Palliative Medicine) Imaging, [facility] as Radiologist (Diagnostic Radiology)  Date of Service:  04/07/2024  CHIEF COMPLAINT: f/u of HCC  CURRENT THERAPY:  durvalumab  every 4 weeks  Oncology History   Hepatocellular carcinoma in adult [facility]) diagnosed 11/2019 by MRI for f/u of cirrhosis. S/p microwave ablation on 03/01/20. -MRI 06/01/22 showed suspicion for local recurrence and metastasis at L5. Biopsy of L5 confirmed metastatic HCC. S/p repeat microwave ablation on 07/17/22. -started lenvima  08/16/22 -staging CT CAP and bone scan on 09/19/22 showed: interval enlargement of right hepatic lobe HCC; new retropulsed bony fragment at L5; no other new lesions. His previous CT was in 05/2022 and he did not start Lenvima  until 07/2022, so it's hard to evaluate if he is responding to Lenvima  or not, the progression is likely the nature course of his [facility]  -Restaging CT scan from January 02, 2023 showed slightly improved previously treated liver lesion, and additional area of concern in the liver which are difficult to compare with previous scan due to different timing of contrast.  No other evidence of disease progression.  Plan to obtain MRI as next scan.  I personally reviewed the CT scan images and discussed the findings with patient -Will continue Lenvima , he is tolerating well overall. -Due to his weight being above 60 kg now, I increased his Lenvima  to 12 mg daily in Feb 2024 -repeated liver MRI on 04/23/2023 showed stable treated liver lesion, L5 bone mets has slightly increased in size.  Patient developed worsening low  back pain in July 2024, repeated lumbar MRI showed significant disease progression in the L5 bone lesion.  I personally reviewed the scan images and discussed the findings with patient. -I recommend palliative radiation to his L5 lesion for pain control -I recommend changing systemic treatment to immunotherapy, I discussed option of atezolizumab and bevacizumab, durvalumab  and Imjudo , nivo and ipi etc. since he previously progressed on oral TKI, I recommend him to proceed with durvalumab  and Imjudo . He started on 06/30/23 -the goal of therapy is curative/palliative to prolong life and prevent/improve cancer related symptoms. -he underwent Bilateral laminotomies L5-S1 with decompression of the spinal canal and the L5 nerve roots epidural tumor by Dr. Ellery Guthrie On 11/20/2023 -he received SBRT to low back in Feb 2025 -continue durvalumab   Assessment & Plan Hepatocellular carcinoma Clinically stable with current treatment regimen and no new complaints related to [facility]. - Due to his new onset transaminitis and hyperbilirubinemia, will hold treatment today - Stat abdominal ultrasound today  Frequent bowel movements Experiencing frequent bowel movements, approximately three times a day, sometimes loose. Discontinued stool softener a few weeks ago. No associated nausea or other gastrointestinal complaints.  Back pain Occasional back pain, managed with as-needed acetaminophen . No new or worsening symptoms reported.  Plan - Due to abnormal LFTs, durvalumab  was held today - Abdominal ultrasound today showed dilated gallbladder, no stones, enlarged common bile duct. - Will obtain abdominal MRI/MRCP in the next week for further evaluation - Lab and follow-up next week after MRI   SUMMARY OF ONCOLOGIC HISTORY: Oncology History  Hepatocellular carcinoma in adult [facility])  12/08/2019 Imaging   MR ABDOMEN WWO CONTRAST  IMPRESSION: Two small masses in the right hepatic lobe which have characteristics diagnostic  for hepatocellular carcinoma in the setting of cirrhosis. (LI-RADS Category 5: Definitely HCC)   No evidence of abdominal metastatic disease or other significant abnormality.   03/01/2020 Initial Diagnosis   Hepatocellular carcinoma (HCC)   03/01/2020 Procedure   CT GUIDE TISSUE ABLATION     IMPRESSION: Successful percutaneous thermal ablation.   Signed,   Roxie Cord, MD, RPVI   06/02/2020 Imaging   MR ABDOMEN WWO CONTRAST   IMPRESSION: 1. There has been interval ablation at two adjacent sites of the subcapsular right lobe of the liver, hepatic segment VI. There is no residual contrast enhancement at these adjacent sites. LI-RADS category 5 TR, nonviable. 2. There are scattered, subcentimeter foci of arterial hyperenhancement of the anterior right lobe of the liver, for example adjacent to the ablation site measuring 3 mm and in the medial anterior right lobe of the liver measuring 6 mm. This larger focus is unchanged compared to prior. Others were not previously seen. These findings are nonspecific and consistent with LI-RADS category 3. Attention on follow-up.   09/02/2020 Imaging   MR ABDOMEN WWO CONTRAST     IMPRESSION: 1. Post radiofrequency ablation along the margin of the RIGHT hemi liver. Heterogeneous appearance of this area on both T1 and T2. No sign of residual enhancement. LR TR nonviable. 2. Very subtle focus of restricted diffusion in the cephalad lateral segment LEFT hepatic lobe measuring 7 mm, corresponding to low signal on image 27 of series 21. Arterial enhancement is not seen in this area and this areas not seen on prior imaging. Not clear whether this represents a new lesion as this area is obscured largely by artifact on most sequences on today's study and on previous imaging evaluations. LR category 3, consider a 3 to six-month follow-up with abdominal MRI. 3. Lesions of concern scattered about the RIGHT hepatic lobe may represent  perfusional anomalies as they were not seen on today's study. 4. Stable mild dilation of the biliary tree.   11/27/2020 Imaging   MR ABDOMEN WWO CONTRAST   IMPRESSION: Minimal decrease in size of ablation defect in the peripheral anterior right hepatic lobe. No evidence of locally recurrent hepatocellular carcinoma or other suspicious liver lesions.   No evidence of metastatic disease or other acute findings.   04/03/2021 Imaging   MR ABDOMEN WWO CONTRAST   IMPRESSION: Stable ablation defect in the lateral right hepatic lobe, without evidence of recurrent tumor at this site.   5 mm hypovascular lesion in segment 5 of the right lobe was not definitely seen on previous study, but is too small to characterize. This shows no arterial phase hyperenhancement. Recommend continued follow-up by MRI in 6 months.   No evidence of abdominal metastatic disease.        09/03/2021 Imaging   MR ABDOMEN WWO CONTRAST   IMPRESSION: 1. Side-by-side ablation sites in the right hepatic lobe in segment 6. The more anterior site remains largely cystic in appearance and no findings suspicious recurrent tumor in this area. There is however abnormal contrast enhancement around the larger more posterior and lateral ablation site worrisome for recurrent tumor. 2. Several small foci of early arterial phase enhancement in the liver. The peripheral lesions are likely vascular shunts. The more central lesions are likely dysplastic nodules. Recommend continued surveillance. 3. Stable bilateral renal cysts.   12/07/2021 Imaging   MR ABDOMEN WWO CONTRAST  IMPRESSION: 1. Ablation defects in the right  lobe of the liver, similar to the prior study, without definitive evidence to suggest local recurrence of disease or metastatic disease. 2. Multiple tiny arterial phase areas of nodular hyperenhancement scattered throughout the liver, predominantly in the right lobe of the liver, without perceptible  signal abnormality or diffusion restriction on other pulse sequences, nonspecific, and likely small benign areas of vascular shunting. Continued attention on follow-up imaging is recommended. 3. Multiple Bosniak class 1 and Bosniak class 2 cysts in the kidneys bilaterally, similar to the prior study, as above. 4. Aortic atherosclerosis.     06/01/2022 Imaging   MR ABDOMEN WWO CONTRAST   IMPRESSION: 1. LR TR equivocal but suspicious findings with respect to previously treated disease in the RIGHT hepatic lobe given nodular enhancement adjacent and anterior superior to the ablation zone as described. 2. Given lack of late arterial phase due to bolus timing a second area of abnormality within the liver is suspicious for additional site of hepatocellular carcinoma showing washout appearance and capsule appearance on current imaging, cardiac pulsation limiting assessment in the area of concern. 3. Suspect bony metastatic disease in the lower lumbar spine at L5 level. Dedicated spinal imaging may be helpful for further evaluation.     06/18/2022 Imaging   MR Lumbar Spine W Wo Contrast   IMPRESSION: L5 vertebral body lesion is most consistent with a metastasis. There is probable trace ventral epidural extension   07/17/2022 Procedure   CT GUIDE TISSUE ABLATION   IMPRESSION: Successful CT-guided microwave ablation of recurrent hepatocellular carcinoma.   07/17/2022 Procedure   CT BONE TROCAR/NEEDLE BIOPSY SUPERFICIAL   IMPRESSION: Successful CT-guided core biopsy of L5 bone lesion.      07/17/2022 Pathology Results   SURGICAL PATHOLOGY  CASE: [id number]  PATIENT: LORIMER TIBERIO  Surgical Pathology Report   SURGICAL PATHOLOGY  CASE: [id number]  PATIENT: Luane Rumps  Surgical Pathology Report      Clinical History: Hx of cirrhosis and small HCC with unexpected  suspicious lesion at L5. Rare HCC met to bone vs atypical hemangioma or  met from new primary.  (crm)      FINAL MICROSCOPIC DIAGNOSIS:   A. 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 No acute findings within the chest. No suspicious findings for metastatic disease within the chest. 2. Unchanged 3 mm nodule within the medial right upper lobe. No new or suspicious lung nodules. 3. Suboptimal visualization of the known liver lesions, reflecting lack of IV contrast material. Similar appearance of ablation defect within the lateral right lobe of liver. 4. Left renal calculi. 5. Coronary artery calcifications. 6.  Aortic Atherosclerosis   06/25/2023 Imaging   NM Bone Scan whole body  IMPRESSION: 1. Known L5 metastasis demonstrates low level uptake, mildly increased from previous bone scan. 2. No new lesions demonstrated.   06/30/2023 -  Chemotherapy   Patient is on Treatment Plan : Hepatocellular Carcinoma Tremelimumab -actl C1 D1 + Durvalumab  q28d       Imaging        Discussed the use of AI scribe software for clinical note transcription with the patient, who gave verbal consent to proceed.  History of Present Illness Corey Smyers Sr. is a 68 year old male with hepatocellular carcinoma who presents for follow-up.  He is clinically stable and eating well. He experiences frequent bowel movements, approximately three  times a day, which are sometimes loose. He stopped using a stool softener a few weeks ago. No nausea or other gastrointestinal complaints. Occasional back pain is managed with Tylenol  as needed.     All other systems were reviewed with the patient and are negative.  MEDICAL HISTORY:  Past Medical History:  Diagnosis Date   Headache    migraines   Hepatitis    HX OF hEP c ?    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    SURGICAL  HISTORY: Past Surgical History:  Procedure Laterality Date   BREAST BIOPSY Left 03/26/2024   US  LT BREAST BX W LOC DEV 1ST LESION IMG BX SPEC US  GUIDE 03/26/2024 [facility] MAMMOGRAPHY   DECOMPRESSIVE LUMBAR LAMINECTOMY LEVEL 1 N/A 11/20/2023   Procedure: lumbar laminectomy right Lumbar five with decompression of nerve roots and excision of tumor;  Surgeon: Elna Haggis, MD;  Location: [facility];  Service: Neurosurgery;  Laterality: N/A;   IR RADIOLOGIST EVAL & MGMT  01/25/2020   IR RADIOLOGIST EVAL & MGMT  03/21/2020   IR RADIOLOGIST EVAL & MGMT  06/07/2020   IR RADIOLOGIST EVAL & MGMT  09/05/2020   IR RADIOLOGIST EVAL & MGMT  12/05/2020   IR RADIOLOGIST EVAL & MGMT  05/09/2021   IR RADIOLOGIST EVAL & MGMT  09/06/2021   IR RADIOLOGIST EVAL & MGMT  12/12/2021   IR RADIOLOGIST EVAL & MGMT  06/25/2022   IR RADIOLOGIST EVAL & MGMT  07/24/2022   RADIOLOGY WITH ANESTHESIA N/A 03/01/2020   Procedure: CT WITH ANESTHESIA  THERMAL MICROWAVE  ABLATIION;  Surgeon: Fernando Hoyer, MD;  Location: [facility];  Service: Anesthesiology;  Laterality: N/A;   RADIOLOGY WITH ANESTHESIA N/A 07/17/2022   Procedure: CT MICROWAVE ABLATION;  Surgeon: Roxie Cord, MD;  Location: [facility];  Service: Radiology;  Laterality: N/A;    I have reviewed the social history and family history with the patient and they are unchanged from previous note.  ALLERGIES:  has no known allergies.  MEDICATIONS:  Current Outpatient Medications  Medication Sig Dispense Refill   acetaminophen  (TYLENOL ) 500 MG tablet Take 1,000 mg by mouth every 6 (six) hours as needed.     aspirin  EC 81 MG tablet Take 1 tablet (81 mg total) by mouth daily. Swallow whole. 30 tablet 12   atorvastatin  (LIPITOR) 40 MG tablet Take 1 tablet (40 mg total) by mouth daily. 30 tablet 11   hydrALAZINE  (APRESOLINE ) 10 MG tablet TAKE 1 TABLET 2 (TWO) TIMES DAILY AS NEEDED. TAKE FOR SYSTOLIC BLOOD PRESSURE MORE THAN 170. 180 tablet 3   olmesartan  (BENICAR ) 20 MG tablet Take 1  tablet (20 mg total) by mouth daily. 30 tablet 11   No current facility-administered medications for this visit.    PHYSICAL EXAMINATION: ECOG PERFORMANCE STATUS: 1 - Symptomatic but completely ambulatory  Vitals:   04/07/24 1126  BP: (!) 140/63  Pulse: 77  Resp: 17  Temp: 98.3 F (36.8 C)  SpO2: 100%   Wt Readings from Last 3 Encounters:  04/07/24 128 lb 3.2 oz (58.2 kg)  03/16/24 128 lb 4.8 oz (58.2 kg)  03/10/24 130 lb 2 oz (59 kg)     GENERAL:alert, no distress and comfortable SKIN: skin color, texture, turgor are normal, no rashes or significant lesions EYES: normal, Conjunctiva are pink and non-injected, sclera clear NECK: supple, thyroid  normal size, non-tender, without nodularity LYMPH:  no palpable lymphadenopathy in the cervical, axillary  LUNGS:  clear to auscultation and percussion with normal breathing effort HEART: regular rate & rhythm and no murmurs and no lower extremity edema ABDOMEN:abdomen soft, non-tender and normal bowel sounds Musculoskeletal:no cyanosis of digits and no clubbing  NEURO: alert & oriented x 3 with fluent speech, no focal motor/sensory deficits  Physical Exam    LABORATORY DATA:  I have reviewed the data as listed    Latest Ref Rng & Units 04/07/2024   10:49 AM 03/10/2024   11:02 AM 02/11/2024    1:07 PM  CBC  WBC 4.0 - 10.5 K/uL 4.6  5.9  5.0   Hemoglobin 13.0 - 17.0 g/dL 30.8  65.7  84.6   Hematocrit 39.0 - 52.0 % 31.5  32.5  33.8   Platelets 150 - 400 K/uL 213  274  237         Latest Ref Rng & Units 04/07/2024   10:49 AM 03/10/2024   11:02 AM 02/11/2024    1:07 PM  CMP  Glucose 70 - 99 mg/dL 962  952  93   BUN 8 - 23 mg/dL 11  10  13    Creatinine 0.61 - 1.24 mg/dL 8.41  3.24  4.01   Sodium 135 - 145 mmol/L 138  141  140   Potassium 3.5 - 5.1 mmol/L 3.5  3.3  4.0   Chloride 98 - 111 mmol/L 107  111  108   CO2 22 - 32 mmol/L 21  23  25    Calcium  8.9 - 10.3 mg/dL 9.0  9.2  9.8   Total Protein 6.5 - 8.1 g/dL 7.3  7.4  8.1    Total Bilirubin 0.0 - 1.2 mg/dL 2.2  0.3  0.3   Alkaline Phos 38 - 126 U/L 1,328  621  108   AST 15 - 41 U/L 454  50  17   ALT 0 - 44 U/L 281  87  17       RADIOGRAPHIC STUDIES: I have personally reviewed the radiological images as listed and agreed with the findings in the report. US  Abdomen Complete Result Date: 04/07/2024 CLINICAL DATA:  Hyperbilirubinemia EXAM: ABDOMEN ULTRASOUND COMPLETE COMPARISON:  Ultrasound July 2020.  CT 01/07/2024 FINDINGS: Gallbladder: Dilated gallbladder. No shadowing stones or ductal dilatation. No reported sonographic Murphy's sign. Common bile duct: Diameter: 13 mm, enlarged. Diameter on previous CT of 13 mm as well. Liver: Homogeneous hepatic parenchyma. There is a cystic focus peripherally in the right hepatic lobe on today's examination measuring 3.1 x 2.6 x 2.6 cm. On prior CT scan this measured 2.6 cm. This has some areas of shadowing. Not clearly a simple cyst. There is also hypoechoic focus in left hepatic lobe measuring 2.1 cm. Not clearly seen on the prior examination. Please correlate for history of prior ablation with history of HCC. Portal vein is patent on color Doppler imaging with normal direction of blood flow towards the liver. IVC: No abnormality visualized. Pancreas: Incompletely seen with overlapping bowel gas and soft tissue. Visualized portions are preserved. Spleen: Size and appearance within normal limits. Right Kidney: Length: 11.3 cm in length. Heterogeneous renal parenchyma. There is a central cyst identified, unchanged from previous measuring 2.7 cm. No collecting system dilatation Left Kidney: Length: 12.1 cm. Echogenicity within normal limits. No mass or hydronephrosis visualized. Abdominal aorta: No aneurysm visualized. Other findings: None. IMPRESSION: Dilated gallbladder. No obvious stones. There is also an enlarged common duct but this was seen previously. If there is further concern of the patient's symptoms additional MRI  evaluation  may be of some benefit to further delineate. Complex liver lesions are again identified. One which was not clearly seen previously. Please correlate with history of ablation. Again recommend further evaluation with pre and postcontrast MRI when appropriate. No renal collecting system dilatation. Heterogeneous appearance of the right kidney. Please correlate for any history of renal dysfunction or signs of infectious or inflammatory process. There was some atrophy on the prior CT scan. Contrast CT scan may be of some benefit to further delineate when appropriate. Electronically Signed   By: Adrianna Horde M.D.   On: 04/07/2024 15:06      Orders Placed This Encounter  Procedures   CT CHEST ABDOMEN PELVIS W CONTRAST    Standing Status:   Future    Expected Date:   04/28/2024    Expiration Date:   04/07/2025    If indicated for the ordered procedure, I authorize the administration of contrast media per Radiology protocol:   Yes    Does the patient have a contrast media/X-ray dye allergy?:   No    Preferred imaging location?:   [facility]    If indicated for the ordered procedure, I authorize the administration of oral contrast media per Radiology protocol:   Yes   US  Abdomen Complete    Standing Status:   Future    Number of Occurrences:   1    Expected Date:   04/07/2024    Expiration Date:   04/07/2025    Reason for Exam (SYMPTOM  OR DIAGNOSIS REQUIRED):   hyperbilirubinemi, rule out biliary obstruction    Preferred imaging location?:   [facility]    Call Results- Best Contact Number?:   [phone] / hold patient   MR ABDOMEN MRCP W WO CONTAST    Standing Status:   Future    Expected Date:   04/12/2024    Expiration Date:   04/07/2025    If indicated for the ordered procedure, I authorize the administration of contrast media per Radiology protocol:   Yes    What is the patient's sedation requirement?:   No Sedation    Does the patient have a pacemaker or implanted devices?:    No    Preferred imaging location?:   [facility] (table limit - 550 lbs)   CBC with Differential ([facility])    Standing Status:   Future    Expected Date:   06/02/2024    Expiration Date:   06/02/2025   CMP ([facility] only)    Standing Status:   Future    Expected Date:   06/02/2024    Expiration Date:   06/02/2025   T4    Standing Status:   Future    Expected Date:   06/02/2024    Expiration Date:   06/02/2025   TSH    Standing Status:   Future    Expected Date:   06/02/2024    Expiration Date:   06/02/2025   CBC with Differential ([facility])    Standing Status:   Future    Expected Date:   06/30/2024    Expiration Date:   06/30/2025   CMP ([facility] only)    Standing Status:   Future    Expected Date:   06/30/2024    Expiration Date:   06/30/2025   T4    Standing Status:   Future    Expected Date:   06/30/2024    Expiration Date:  06/30/2025   TSH    Standing Status:   Future    Expected Date:   06/30/2024    Expiration Date:   06/30/2025   All questions were answered. The patient knows to call the clinic with any problems, questions or concerns. No barriers to learning was detected. The total time spent in the appointment was 30 minutes, including review of chart and various tests results, discussions about plan of care and coordination of care plan     Sonja Allen, MD 04/07/2024

## 2024-04-07 NOTE — Progress Notes (Signed)
 Critical Lab Value reported: AST 454 & ALT 281.  Dr. Maryalice Smaller notified.

## 2024-04-07 NOTE — Progress Notes (Unsigned)
 Per Maryalice Smaller, no treatment today due to abnormal CMP. Pt sent for ultrasound.

## 2024-04-07 NOTE — Progress Notes (Signed)
 Palliative Medicine [facility]  Telephone:(336) [phone] Fax:(336) [phone]   Name: Corey Levene Sr. Date: 04/07/2024 MRN: [id number]  DOB: [birth year]  Patient Care Team: [name], MD as PCP - General (Internal Medicine) [name], MD as PCP - Cardiology (Cardiology) Sonja [place], MD as Attending Physician (Hematology and Oncology) [name], Giles Labrum, NP as Nurse Practitioner (Hospice and Palliative Medicine) Imaging, [facility] as Radiologist (Diagnostic Radiology)    INTERVAL HISTORY: Corey Reever Sr. is a 68 y.o. 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 Palliative ask to see for symptom and pain management and goals of care.   SOCIAL HISTORY:     reports that he has quit smoking. He has been exposed to tobacco smoke. He has never used smokeless tobacco. He reports that he does not currently use alcohol. He reports that he does not use drugs.  ADVANCE DIRECTIVES:  None on file   CODE STATUS: Full code  PAST MEDICAL HISTORY: Past Medical History:  Diagnosis Date   Headache    migraines   Hepatitis    HX OF hEP c ?    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    ALLERGIES:  has no known allergies.  MEDICATIONS:  Current Outpatient Medications  Medication Sig Dispense Refill   acetaminophen  (TYLENOL ) 500 MG tablet Take 1,000 mg by mouth every 6 (six) hours as needed.     aspirin  EC 81 MG tablet Take 1 tablet (81 mg total) by mouth daily. Swallow whole. 30 tablet 12   atorvastatin  (LIPITOR) 40 MG tablet Take 1 tablet (40 mg total) by mouth daily. 30 tablet 11   hydrALAZINE  (APRESOLINE ) 10 MG tablet TAKE 1 TABLET 2 (TWO) TIMES DAILY AS NEEDED. TAKE FOR SYSTOLIC BLOOD PRESSURE MORE THAN 170. 180 tablet 3   olmesartan  (BENICAR ) 20 MG tablet Take 1 tablet (20 mg total) by mouth daily.  30 tablet 11   No current facility-administered medications for this visit.    VITAL SIGNS: There were no vitals taken for this visit. There were no vitals filed for this visit.  Estimated body mass index is 20.08 kg/m as calculated from the following:   Height as of an earlier encounter on 04/07/24: 5\' 7"  (1.702 m).   Weight as of an earlier encounter on 04/07/24: 128 lb 3.2 oz (58.2 kg).   PERFORMANCE STATUS (ECOG) : 1 - Symptomatic but completely ambulatory   Physical Exam General: NAD Cardiovascular: regular rate and rhythm Pulmonary: normal breathing pattern Extremities: no edema, no joint deformities Skin: no rashes Neurological: AAO x3  IMPRESSION: Discussed the use of AI scribe software for clinical note transcription with the patient, who gave verbal consent to proceed.  History of Present Illness Corey Parlato Sr. is a 68 year old male who presents to clinic for follow-up. No acute distress noted. Reports he is doing well overall. Denies concerns with nausea or vomiting.  Occasional fatigue however he continues to remain as active as possible.  Occasional pain which he uses gabapentin  as needed. Does not require daily use. Patient is going to have liver ultrasound per oncology due to elevated enzymes of AST 454 and ALT 281, alk phos 1328.  Manage symptoms at this time.  Will continue to support and follow as needed All questions answered and support provided.   I discussed the importance of continued conversation  with family and their medical providers regarding overall plan of care and treatment options, ensuring decisions are within the context of the patients values and GOCs.  Assessment and Plan Assessment & Plan Back pain Occasional back pain which is managed with as needed use of gabapentin .  Does not require daily.   - Patient aware to discontinue any use of Tylenol  products due to compromised liver - Use gabapentin  as needed for severe  pain.  Fatigue Low energy levels possibly related to sleep disturbance and recent surgery.  Patient expressed understanding and was in agreement with this plan. He also understands that He can call the clinic at any time with any questions, concerns, or complaints.   Any controlled substances utilized were prescribed in the context of palliative care. PDMP has been reviewed.   Visit consisted of counseling and education dealing with the complex and emotionally intense issues of symptom management and palliative care in the setting of serious and potentially life-threatening illness.  [name], AGPCNP-BC  Palliative Medicine Team/[facility]

## 2024-04-07 NOTE — Progress Notes (Unsigned)
 Critical AST 454 and ALT 281 called to [facility] Melvin 1200 by Marciano Settles

## 2024-04-08 ENCOUNTER — Telehealth: Payer: Self-pay | Admitting: Hematology

## 2024-04-08 ENCOUNTER — Telehealth: Payer: Self-pay

## 2024-04-08 LAB — T4: T4, Total: 11 ug/dL (ref 4.5–12.0)

## 2024-04-08 NOTE — Telephone Encounter (Signed)
 Spoke with pt to make pt aware that the Abdominal US  didn't show why the pt's LFTs were abnormal; therefore, Dr. Maryalice Smaller would like for the pt to have a MRI MRCP which she has ordered & pt's insurance has approved.  Stated someone from [place] [facility] will contact the pt to get him scheduled.  Stated Dr. Maryalice Smaller would like for him to have it done by 04/16/2024.  Pt verbalized understanding and stated he will await the call from [facility].

## 2024-04-10 ENCOUNTER — Other Ambulatory Visit (HOSPITAL_COMMUNITY): Payer: Self-pay | Admitting: Hematology

## 2024-04-10 ENCOUNTER — Ambulatory Visit (HOSPITAL_COMMUNITY)
Admission: RE | Admit: 2024-04-10 | Discharge: 2024-04-10 | Disposition: A | Discharge status: Home / Self Care | Source: Ambulatory Visit | Attending: Hematology | Admitting: Hematology

## 2024-04-10 DIAGNOSIS — R1010 Upper abdominal pain, unspecified: Secondary | ICD-10-CM

## 2024-04-10 DIAGNOSIS — E222 Syndrome of inappropriate secretion of antidiuretic hormone: Secondary | ICD-10-CM

## 2024-04-10 DIAGNOSIS — C220 Liver cell carcinoma: Secondary | ICD-10-CM

## 2024-04-10 MED ORDER — GADOBUTROL 1 MMOL/ML IV SOLN
6.0000 mL | Freq: Once | INTRAVENOUS | Status: AC | PRN
  Administered 2024-04-10: 6 mL via INTRAVENOUS

## 2024-04-13 ENCOUNTER — Other Ambulatory Visit: Payer: Self-pay

## 2024-04-13 ENCOUNTER — Ambulatory Visit (HOSPITAL_COMMUNITY)
Admission: RE | Admit: 2024-04-13 | Discharge: 2024-04-13 | Disposition: A | Discharge status: Home / Self Care | Source: Ambulatory Visit | Attending: Hematology | Admitting: Hematology

## 2024-04-13 DIAGNOSIS — C220 Liver cell carcinoma: Secondary | ICD-10-CM | POA: Diagnosis present

## 2024-04-13 MED ORDER — IOHEXOL 300 MG/ML  SOLN
100.0000 mL | Freq: Once | INTRAMUSCULAR | Status: AC | PRN
  Administered 2024-04-13: 100 mL via INTRAVENOUS

## 2024-04-14 ENCOUNTER — Other Ambulatory Visit: Payer: Self-pay

## 2024-04-14 DIAGNOSIS — C220 Liver cell carcinoma: Secondary | ICD-10-CM

## 2024-04-15 ENCOUNTER — Inpatient Hospital Stay: Attending: Hematology

## 2024-04-15 ENCOUNTER — Inpatient Hospital Stay

## 2024-04-15 ENCOUNTER — Telehealth: Payer: Self-pay

## 2024-04-15 ENCOUNTER — Inpatient Hospital Stay (HOSPITAL_BASED_OUTPATIENT_CLINIC_OR_DEPARTMENT_OTHER): Admitting: Hematology

## 2024-04-15 VITALS — BP 132/58 | HR 80 | Temp 98.0°F | Resp 15 | Ht 67.0 in | Wt 128.2 lb

## 2024-04-15 DIAGNOSIS — M21379 Foot drop, unspecified foot: Secondary | ICD-10-CM | POA: Diagnosis not present

## 2024-04-15 DIAGNOSIS — R011 Cardiac murmur, unspecified: Secondary | ICD-10-CM | POA: Diagnosis not present

## 2024-04-15 DIAGNOSIS — Z7952 Long term (current) use of systemic steroids: Secondary | ICD-10-CM | POA: Insufficient documentation

## 2024-04-15 DIAGNOSIS — N200 Calculus of kidney: Secondary | ICD-10-CM | POA: Insufficient documentation

## 2024-04-15 DIAGNOSIS — K7460 Unspecified cirrhosis of liver: Secondary | ICD-10-CM | POA: Insufficient documentation

## 2024-04-15 DIAGNOSIS — M7989 Other specified soft tissue disorders: Secondary | ICD-10-CM | POA: Diagnosis not present

## 2024-04-15 DIAGNOSIS — N281 Cyst of kidney, acquired: Secondary | ICD-10-CM | POA: Insufficient documentation

## 2024-04-15 DIAGNOSIS — R911 Solitary pulmonary nodule: Secondary | ICD-10-CM | POA: Diagnosis not present

## 2024-04-15 DIAGNOSIS — R17 Unspecified jaundice: Secondary | ICD-10-CM | POA: Insufficient documentation

## 2024-04-15 DIAGNOSIS — I10 Essential (primary) hypertension: Secondary | ICD-10-CM | POA: Diagnosis not present

## 2024-04-15 DIAGNOSIS — I2510 Atherosclerotic heart disease of native coronary artery without angina pectoris: Secondary | ICD-10-CM | POA: Insufficient documentation

## 2024-04-15 DIAGNOSIS — Z79899 Other long term (current) drug therapy: Secondary | ICD-10-CM | POA: Insufficient documentation

## 2024-04-15 DIAGNOSIS — I700 Atherosclerosis of aorta: Secondary | ICD-10-CM | POA: Diagnosis not present

## 2024-04-15 DIAGNOSIS — C7951 Secondary malignant neoplasm of bone: Secondary | ICD-10-CM | POA: Insufficient documentation

## 2024-04-15 DIAGNOSIS — Z510 Encounter for antineoplastic radiation therapy: Secondary | ICD-10-CM | POA: Diagnosis not present

## 2024-04-15 DIAGNOSIS — C220 Liver cell carcinoma: Secondary | ICD-10-CM

## 2024-04-15 DIAGNOSIS — Z87891 Personal history of nicotine dependence: Secondary | ICD-10-CM | POA: Diagnosis not present

## 2024-04-15 DIAGNOSIS — R600 Localized edema: Secondary | ICD-10-CM | POA: Insufficient documentation

## 2024-04-15 DIAGNOSIS — Z7982 Long term (current) use of aspirin: Secondary | ICD-10-CM | POA: Insufficient documentation

## 2024-04-15 LAB — CBC WITH DIFFERENTIAL (CANCER CENTER ONLY)
Abs Immature Granulocytes: 0.01 10*3/uL (ref 0.00–0.07)
Basophils Absolute: 0 10*3/uL (ref 0.0–0.1)
Basophils Relative: 1 %
Eosinophils Absolute: 0.1 10*3/uL (ref 0.0–0.5)
Eosinophils Relative: 2 %
HCT: 28 % — ABNORMAL LOW (ref 39.0–52.0)
Hemoglobin: 9.5 g/dL — ABNORMAL LOW (ref 13.0–17.0)
Immature Granulocytes: 0 %
Lymphocytes Relative: 16 %
Lymphs Abs: 0.8 10*3/uL (ref 0.7–4.0)
MCH: 29.6 pg (ref 26.0–34.0)
MCHC: 33.9 g/dL (ref 30.0–36.0)
MCV: 87.2 fL (ref 80.0–100.0)
Monocytes Absolute: 0.6 10*3/uL (ref 0.1–1.0)
Monocytes Relative: 12 %
Neutro Abs: 3.2 10*3/uL (ref 1.7–7.7)
Neutrophils Relative %: 69 %
Platelet Count: 228 10*3/uL (ref 150–400)
RBC: 3.21 MIL/uL — ABNORMAL LOW (ref 4.22–5.81)
RDW: 17.8 % — ABNORMAL HIGH (ref 11.5–15.5)
WBC Count: 4.6 10*3/uL (ref 4.0–10.5)
nRBC: 0 % (ref 0.0–0.2)

## 2024-04-15 LAB — CMP (CANCER CENTER ONLY)
ALT: 199 U/L — ABNORMAL HIGH (ref 0–44)
AST: 366 U/L (ref 15–41)
Albumin: 3.4 g/dL — ABNORMAL LOW (ref 3.5–5.0)
Alkaline Phosphatase: 1438 U/L — ABNORMAL HIGH (ref 38–126)
Anion gap: 6 (ref 5–15)
BUN: 12 mg/dL (ref 8–23)
CO2: 24 mmol/L (ref 22–32)
Calcium: 9.1 mg/dL (ref 8.9–10.3)
Chloride: 110 mmol/L (ref 98–111)
Creatinine: 0.95 mg/dL (ref 0.61–1.24)
GFR, Estimated: >60 mL/min (ref >60)
Glucose, Bld: 111 mg/dL — ABNORMAL HIGH (ref 70–99)
Potassium: 3.6 mmol/L (ref 3.5–5.1)
Sodium: 140 mmol/L (ref 135–145)
Total Bilirubin: 3.2 mg/dL — ABNORMAL HIGH (ref 0.0–1.2)
Total Protein: 7.5 g/dL (ref 6.5–8.1)

## 2024-04-15 MED ORDER — PREDNISONE 20 MG PO TABS: 60.0000 mg | ORAL_TABLET | Freq: Every day | ORAL | 0 refills | Status: DC

## 2024-04-15 MED ORDER — PANTOPRAZOLE SODIUM 20 MG PO TBEC: 20.0000 mg | DELAYED_RELEASE_TABLET | Freq: Every day | ORAL | 1 refills | Status: DC

## 2024-04-15 NOTE — Progress Notes (Signed)
 [facility]   Telephone:([phone] Fax:(336) [phone]   Clinic Follow up Note   Patient Care Team: [name], MD as PCP - General (Internal Medicine) [name], MD as PCP - Cardiology (Cardiology) Sonja [place], MD as Attending Physician (Hematology and Oncology) [name], Giles Labrum, NP as Nurse Practitioner (Hospice and Palliative Medicine) Imaging, [facility] as Radiologist (Diagnostic Radiology)  Date of Service:  04/15/2024  CHIEF COMPLAINT: f/u of [name] (Hosp[facility])  CURRENT THERAPY:  Durvalumab   Oncology History   Hepatocellular carcinoma in adult [facility]) diagnosed 11/2019 by MRI for f/u of cirrhosis. S/p microwave ablation on 03/01/20. -MRI 06/01/22 showed suspicion for local recurrence and metastasis at L5. Biopsy of L5 confirmed metastatic HCC. S/p repeat microwave ablation on 07/17/22. -started lenvima  08/16/22 -staging CT CAP and bone scan on 09/19/22 showed: interval enlargement of right hepatic lobe HCC; new retropulsed bony fragment at L5; no other new lesions. His previous CT was in 05/2022 and he did not start Lenvima  until 07/2022, so it's hard to evaluate if he is responding to Lenvima  or not, the progression is likely the nature course of his [facility]  -Restaging CT scan from January 02, 2023 showed slightly improved previously treated liver lesion, and additional area of concern in the liver which are difficult to compare with previous scan due to different timing of contrast.  No other evidence of disease progression.  Plan to obtain MRI as next scan.  I personally reviewed the CT scan images and discussed the findings with patient -Will continue Lenvima , he is tolerating well overall. -Due to his weight being above 60 kg now, I increased his Lenvima  to 12 mg daily in Feb 2024 -repeated liver MRI on 04/23/2023 showed stable treated liver lesion, L5 bone mets has slightly increased in size.  Patient developed worsening low back pain in July  2024, repeated lumbar MRI showed significant disease progression in the L5 bone lesion.  I personally reviewed the scan images and discussed the findings with patient. -I recommend palliative radiation to his L5 lesion for pain control -I recommend changing systemic treatment to immunotherapy, I discussed option of atezolizumab and bevacizumab, durvalumab  and Imjudo , nivo and ipi etc. since he previously progressed on oral TKI, I recommend him to proceed with durvalumab  and Imjudo . He started on 06/30/23 -the goal of therapy is curative/palliative to prolong life and prevent/improve cancer related symptoms. -he underwent Bilateral laminotomies L5-S1 with decompression of the spinal canal and the L5 nerve roots epidural tumor by Dr. Ellery Guthrie On 11/20/2023 -he received SBRT to low back in Feb 2025 -CT and abdominal MRI showed new enhancing lesion posteriorly in segment 7, suspicious for recurrence/metastasis, otherwise stable treated liver lesions and bone mets.  I discussed option of ablation versus SBRT radiation.  Patient prefers SBRT. -will reach out to Dr. Lorri Rota and change his Durvalumab  to carbozentinib when his LFT improves   Assessment & Plan Hepatocellular carcinoma (HCC) HCC with a new 1.1 cm suspicious liver lesion. Previous lesions are well-managed. The new lesion is unlikely to cause elevated bilirubin. Treatment options include radiation or ablation. Immunotherapy discontinued due to suspected autoimmune hepatitis and new liver lesion. Radiation is preferred for its non-invasive nature and minimal side effects. If radiation is not feasible, ablation will be considered. Further systemic treatment with cabozantinib, an oral TKI, may be considered if local therapies are insufficient and cancer progression is evident. - Consult Dr. Lorri Rota for potential radiation treatment. - If radiation is not feasible, consult Dr.  Mercola for ablation. - Cancel current immunotherapy infusion. - Consider  cabozantinib if local therapies are insufficient and cancer progression is detected.  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  Immunotherapy discontinued to allow liver function recovery. Steroids considered if liver function does not improve. Monitoring liver function is crucial to determine steroid necessity. - Monitor liver function tests. - Consider steroids if liver function does not improve. - Repeat labs in two weeks.  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 Monitoring liver function is essential to assess improvement. - Monitor liver function tests.  Bone metastasis Bone metastasis with previous treatments including ablation and radiation. No current active treatment planned for bone metastasis.  Plan - Lab reviewed, liver enzymes slightly improved, however bilirubin went up to 3.2 today. - I reviewed the CT and MRI scan findings with patient and his wife.  No biliary obstruction. - I recommend steroid for his abnormal liver function, I called in prednisone, will start 60 mg daily for 2 to 3 weeks, then gradually taper off in 4 to 6 weeks. - I also called in Protonix  -lab weekly and Follow-up in 2 weeks. - I referred him back to rad unk Dr. Lorri Rota for SBRT treatment of his new liver lesion.   SUMMARY OF ONCOLOGIC HISTORY: Oncology History  Hepatocellular carcinoma in adult [facility])  12/08/2019 Imaging   MR ABDOMEN WWO CONTRAST   IMPRESSION: Two small masses in the right hepatic lobe which have characteristics diagnostic for hepatocellular carcinoma in the setting of cirrhosis. (LI-RADS Category 5: Definitely HCC)   No evidence of abdominal metastatic disease or other significant abnormality.   03/01/2020 Initial Diagnosis   Hepatocellular carcinoma (HCC)    03/01/2020 Procedure   CT GUIDE TISSUE ABLATION     IMPRESSION: Successful percutaneous thermal ablation.   Signed,   Roxie Cord, MD, RPVI   06/02/2020 Imaging   MR ABDOMEN WWO CONTRAST   IMPRESSION: 1. There has been interval ablation at two adjacent sites of the subcapsular right lobe of the liver, hepatic segment VI. There is no residual contrast enhancement at these adjacent sites. LI-RADS category 5 TR, nonviable. 2. There are scattered, subcentimeter foci of arterial hyperenhancement of the anterior right lobe of the liver, for example adjacent to the ablation site measuring 3 mm and in the medial anterior right lobe of the liver measuring 6 mm. This larger focus is unchanged compared to prior. Others were not previously seen. These findings are nonspecific and consistent with LI-RADS category 3. Attention on follow-up.   09/02/2020 Imaging   MR ABDOMEN WWO CONTRAST     IMPRESSION: 1. Post radiofrequency ablation along the margin of the RIGHT hemi liver. Heterogeneous appearance of this area on both T1 and T2. No sign of residual enhancement. LR TR nonviable. 2. Very subtle focus of restricted diffusion in the cephalad lateral segment LEFT hepatic lobe measuring 7 mm, corresponding to low signal on image 27 of series 21. Arterial enhancement is not seen in this area and this areas not seen on prior imaging. Not clear whether this represents a new lesion as this area is obscured largely by artifact on most sequences on today's study and on previous imaging evaluations. LR category 3, consider a 3 to six-month follow-up with abdominal MRI. 3. Lesions of concern scattered about the RIGHT hepatic lobe may represent perfusional anomalies as they were not seen on  today's study. 4. Stable mild dilation of the biliary tree.   11/27/2020 Imaging   MR ABDOMEN WWO CONTRAST   IMPRESSION: Minimal decrease in size of ablation defect in the peripheral anterior  right hepatic lobe. No evidence of locally recurrent hepatocellular carcinoma or other suspicious liver lesions.   No evidence of metastatic disease or other acute findings.   04/03/2021 Imaging   MR ABDOMEN WWO CONTRAST   IMPRESSION: Stable ablation defect in the lateral right hepatic lobe, without evidence of recurrent tumor at this site.   5 mm hypovascular lesion in segment 5 of the right lobe was not definitely seen on previous study, but is too small to characterize. This shows no arterial phase hyperenhancement. Recommend continued follow-up by MRI in 6 months.   No evidence of abdominal metastatic disease.        09/03/2021 Imaging   MR ABDOMEN WWO CONTRAST   IMPRESSION: 1. Side-by-side ablation sites in the right hepatic lobe in segment 6. The more anterior site remains largely cystic in appearance and no findings suspicious recurrent tumor in this area. There is however abnormal contrast enhancement around the larger more posterior and lateral ablation site worrisome for recurrent tumor. 2. Several small foci of early arterial phase enhancement in the liver. The peripheral lesions are likely vascular shunts. The more central lesions are likely dysplastic nodules. Recommend continued surveillance. 3. Stable bilateral renal cysts.   12/07/2021 Imaging   MR ABDOMEN WWO CONTRAST  IMPRESSION: 1. Ablation defects in the right lobe of the liver, similar to the prior study, without definitive evidence to suggest local recurrence of disease or metastatic disease. 2. Multiple tiny arterial phase areas of nodular hyperenhancement scattered throughout the liver, predominantly in the right lobe of the liver, without perceptible signal abnormality or diffusion restriction on other pulse sequences, nonspecific, and likely small benign areas of vascular shunting. Continued attention on follow-up imaging is recommended. 3. Multiple Bosniak class 1 and Bosniak class 2 cysts in  the kidneys bilaterally, similar to the prior study, as above. 4. Aortic atherosclerosis.     06/01/2022 Imaging   MR ABDOMEN WWO CONTRAST   IMPRESSION: 1. LR TR equivocal but suspicious findings with respect to previously treated disease in the RIGHT hepatic lobe given nodular enhancement adjacent and anterior superior to the ablation zone as described. 2. Given lack of late arterial phase due to bolus timing a second area of abnormality within the liver is suspicious for additional site of hepatocellular carcinoma showing washout appearance and capsule appearance on current imaging, cardiac pulsation limiting assessment in the area of concern. 3. Suspect bony metastatic disease in the lower lumbar spine at L5 level. Dedicated spinal imaging may be helpful for further evaluation.     06/18/2022 Imaging   MR Lumbar Spine W Wo Contrast   IMPRESSION: L5 vertebral body lesion is most consistent with a metastasis. There is probable trace ventral epidural extension   07/17/2022 Procedure   CT GUIDE TISSUE ABLATION   IMPRESSION: Successful CT-guided microwave ablation of recurrent hepatocellular carcinoma.   07/17/2022 Procedure   CT BONE TROCAR/NEEDLE BIOPSY SUPERFICIAL   IMPRESSION: Successful CT-guided core biopsy of L5 bone lesion.      07/17/2022 Pathology Results   SURGICAL PATHOLOGY  CASE: [id number]  PATIENT: WERNER LABELLA  Surgical Pathology Report   SURGICAL PATHOLOGY  CASE: [id number]  PATIENT: Luane Rumps  Surgical Pathology Report      Clinical History: Hx of cirrhosis and small HCC with unexpected  suspicious lesion at  L5. Rare HCC met to bone vs atypical hemangioma or  met from new primary. (crm)      FINAL MICROSCOPIC DIAGNOSIS:   A. BONE, L5 VERTEBRAL BODY LESION, BIOPSY:  - Metastatic carcinoma to bone, consistent with patient's clinical  history of primary hepatocellular carcinoma    06/25/2023 Imaging   CT chest without  contrast  IMPRESSION: 1. No acute findings within the chest. No suspicious findings for metastatic disease within the chest. 2. Unchanged 3 mm nodule within the medial right upper lobe. No new or suspicious lung nodules. 3. Suboptimal visualization of the known liver lesions, reflecting lack of IV contrast material. Similar appearance of ablation defect within the lateral right lobe of liver. 4. Left renal calculi. 5. Coronary artery calcifications. 6.  Aortic Atherosclerosis   06/25/2023 Imaging   NM Bone Scan whole body  IMPRESSION: 1. Known L5 metastasis demonstrates low level uptake, mildly increased from previous bone scan. 2. No new lesions demonstrated.   06/30/2023 - 03/10/2024 Chemotherapy   Patient is on Treatment Plan : Hepatocellular Carcinoma Tremelimumab -actl C1 D1 + Durvalumab  q28d       Imaging        Discussed the use of AI scribe software for clinical note transcription with the patient, who gave verbal consent to proceed.  History of Present Illness Corey Kujawa Sr. is a 68 year old male with hepatocellular carcinoma who presents for follow-up.  Recent imaging reveals a new 1.1 cm lesion in the liver. He has undergone ablation for liver lesions and radiation therapy for bone metastasis. Currently, he is not on any treatment and awaits further evaluation of liver function. Urine is light brown, consistent with previous observations. He experiences no pain, maintains a good appetite, and has stable weight. Abnormal liver function tests were noted recently. He has a history of chest and bone biopsies and surgeries.     All other systems were reviewed with the patient and are negative.  MEDICAL HISTORY:  Past Medical History:  Diagnosis Date   Headache    migraines   Hepatitis    HX OF hEP c ?    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    SURGICAL HISTORY: Past Surgical History:  Procedure Laterality Date    BREAST BIOPSY Left 03/26/2024   US  LT BREAST BX W LOC DEV 1ST LESION IMG BX SPEC US  GUIDE 03/26/2024 [facility] MAMMOGRAPHY   DECOMPRESSIVE LUMBAR LAMINECTOMY LEVEL 1 N/A 11/20/2023   Procedure: lumbar laminectomy right Lumbar five with decompression of nerve roots and excision of tumor;  Surgeon: Elna Haggis, MD;  Location: [facility] OR;  Service: Neurosurgery;  Laterality: N/A;   IR RADIOLOGIST EVAL & MGMT  01/25/2020   IR RADIOLOGIST EVAL & MGMT  03/21/2020   IR RADIOLOGIST EVAL & MGMT  06/07/2020   IR RADIOLOGIST EVAL & MGMT  09/05/2020   IR RADIOLOGIST EVAL & MGMT  12/05/2020   IR RADIOLOGIST EVAL & MGMT  05/09/2021   IR RADIOLOGIST EVAL & MGMT  09/06/2021   IR RADIOLOGIST EVAL & MGMT  12/12/2021   IR RADIOLOGIST EVAL & MGMT  06/25/2022   IR RADIOLOGIST EVAL & MGMT  07/24/2022   RADIOLOGY WITH ANESTHESIA N/A 03/01/2020   Procedure: CT WITH ANESTHESIA  THERMAL MICROWAVE  ABLATIION;  Surgeon: Fernando Hoyer, MD;  Location: [facility] ORS;  Service: Anesthesiology;  Laterality: N/A;   RADIOLOGY WITH ANESTHESIA N/A 07/17/2022   Procedure: CT MICROWAVE ABLATION;  Surgeon: Marne Sings,  Colman Deans, MD;  Location: [facility] ORS;  Service: Radiology;  Laterality: N/A;    I have reviewed the social history and family history with the patient and they are unchanged from previous note.  ALLERGIES:  has no known allergies.  MEDICATIONS:  Current Outpatient Medications  Medication Sig Dispense Refill   acetaminophen  (TYLENOL ) 500 MG tablet Take 1,000 mg by mouth every 6 (six) hours as needed.     aspirin  EC 81 MG tablet Take 1 tablet (81 mg total) by mouth daily. Swallow whole. 30 tablet 12   atorvastatin  (LIPITOR) 40 MG tablet Take 1 tablet (40 mg total) by mouth daily. 30 tablet 11   hydrALAZINE  (APRESOLINE ) 10 MG tablet TAKE 1 TABLET 2 (TWO) TIMES DAILY AS NEEDED. TAKE FOR SYSTOLIC BLOOD PRESSURE MORE THAN 170. 180 tablet 3   olmesartan  (BENICAR ) 20 MG tablet Take 1 tablet (20 mg total) by mouth daily. 30 tablet 11   pantoprazole   (PROTONIX ) 20 MG tablet Take 1 tablet (20 mg total) by mouth daily. Take when you are prednisone 30 tablet 1   predniSONE (DELTASONE) 20 MG tablet Take 3 tablets (60 mg total) by mouth daily with breakfast. Dose taper per provider after 2 weeks of treatment 90 tablet 0   No current facility-administered medications for this visit.    PHYSICAL EXAMINATION: ECOG PERFORMANCE STATUS: 1 - Symptomatic but completely ambulatory  Vitals:   04/15/24 0946  BP: (!) 132/58  Pulse: 80  Resp: 15  Temp: 98 F (36.7 C)  SpO2: 99%   Wt Readings from Last 3 Encounters:  04/15/24 128 lb 3.2 oz (58.2 kg)  04/07/24 128 lb 3.2 oz (58.2 kg)  03/16/24 128 lb 4.8 oz (58.2 kg)     GENERAL:alert, no distress and comfortable SKIN: skin color, texture, turgor are normal, no rashes or significant lesions EYES: normal, Conjunctiva are pink and non-injected, sclera icterus  NECK: supple, thyroid  normal size, non-tender, without nodularity LYMPH:  no palpable lymphadenopathy in the cervical, axillary  LUNGS: clear to auscultation and percussion with normal breathing effort HEART: regular rate & rhythm and no murmurs and no lower extremity edema ABDOMEN:abdomen soft, non-tender and normal bowel sounds Musculoskeletal:no cyanosis of digits and no clubbing  NEURO: alert & oriented x 3 with fluent speech, no focal motor/sensory deficits  Physical Exam   LABORATORY DATA:  I have reviewed the data as listed    Latest Ref Rng & Units 04/15/2024    9:32 AM 04/07/2024   10:49 AM 03/10/2024   11:02 AM  CBC  WBC 4.0 - 10.5 K/uL 4.6  4.6  5.9   Hemoglobin 13.0 - 17.0 g/dL 9.5  16.1  09.6   Hematocrit 39.0 - 52.0 % 28.0  31.5  32.5   Platelets 150 - 400 K/uL 228  213  274         Latest Ref Rng & Units 04/15/2024    9:32 AM 04/07/2024   10:49 AM 03/10/2024   11:02 AM  CMP  Glucose 70 - 99 mg/dL 045  409  811   BUN 8 - 23 mg/dL 12  11  10    Creatinine 0.61 - 1.24 mg/dL 9.14  7.82  9.56   Sodium 135 - 145  mmol/L 140  138  141   Potassium 3.5 - 5.1 mmol/L 3.6  3.5  3.3   Chloride 98 - 111 mmol/L 110  107  111   CO2 22 - 32 mmol/L 24  21  23    Calcium   8.9 - 10.3 mg/dL 9.1  9.0  9.2   Total Protein 6.5 - 8.1 g/dL 7.5  7.3  7.4   Total Bilirubin 0.0 - 1.2 mg/dL 3.2  2.2  0.3   Alkaline Phos 38 - 126 U/L 1,438  1,328  621   AST 15 - 41 U/L 366  454  50   ALT 0 - 44 U/L 199  281  87       RADIOGRAPHIC STUDIES: I have personally reviewed the radiological images as listed and agreed with the findings in the report. No results found.    No orders of the defined types were placed in this encounter.  All questions were answered. The patient knows to call the clinic with any problems, questions or concerns. No barriers to learning was detected. The total time spent in the appointment was 40 minutes, including review of chart and various tests results, discussions about plan of care and coordination of care plan     Sonja Darden, MD 04/15/2024

## 2024-04-15 NOTE — Telephone Encounter (Signed)
 Critical lab value reported Tbili 3.2  AST366 and ALT 199  Dr. Maryalice Smaller notified.  Dr. Maryalice Smaller called in prescription for PPI and Steroid for pt to start taking.  Dr. Maryalice Smaller wants pt to come in [facility] for labs and f/u with APP or Dr. Randye Buttner on the 3rd wk. Notified pt of Dr. Candise Chambers plans and stated that Dr. Maryalice Smaller has send a prescription for a PPI and Steroid to his preferred pharmacy.  Pt was unaware of the name of his GI provider and stated his colonoscopy was done 20 yr ago.  Pt also stated he had another colonoscopy during his back surgery which was performed by Dr. Ellery Guthrie but after further discussing with pt he did not seem to be sure if a colonoscopy was really done.  Pt stated he would call to let this nurse know who did his last colonoscopy since Dr. Maryalice Smaller would like to know who if he has a GI provider.

## 2024-04-15 NOTE — Assessment & Plan Note (Addendum)
 diagnosed 11/2019 by MRI for f/u of cirrhosis. S/p microwave ablation on 03/01/20. -MRI 06/01/22 showed suspicion for local recurrence and metastasis at L5. Biopsy of L5 confirmed metastatic HCC. S/p repeat microwave ablation on 07/17/22. -started lenvima  08/16/22 -staging CT CAP and bone scan on 09/19/22 showed: interval enlargement of right hepatic lobe HCC; new retropulsed bony fragment at L5; no other new lesions. His previous CT was in 05/2022 and he did not start Lenvima  until 07/2022, so it's hard to evaluate if he is responding to Lenvima  or not, the progression is likely the nature course of his [facility]  -Restaging CT scan from January 02, 2023 showed slightly improved previously treated liver lesion, and additional area of concern in the liver which are difficult to compare with previous scan due to different timing of contrast.  No other evidence of disease progression.  Plan to obtain MRI as next scan.  I personally reviewed the CT scan images and discussed the findings with patient -Will continue Lenvima , he is tolerating well overall. -Due to his weight being above 60 kg now, I increased his Lenvima  to 12 mg daily in Feb 2024 -repeated liver MRI on 04/23/2023 showed stable treated liver lesion, L5 bone mets has slightly increased in size.  Patient developed worsening low back pain in July 2024, repeated lumbar MRI showed significant disease progression in the L5 bone lesion.  I personally reviewed the scan images and discussed the findings with patient. -I recommend palliative radiation to his L5 lesion for pain control -I recommend changing systemic treatment to immunotherapy, I discussed option of atezolizumab and bevacizumab, durvalumab  and Imjudo , nivo and ipi etc. since he previously progressed on oral TKI, I recommend him to proceed with durvalumab  and Imjudo . He started on 06/30/23 -the goal of therapy is curative/palliative to prolong life and prevent/improve cancer related symptoms. -he underwent  Bilateral laminotomies L5-S1 with decompression of the spinal canal and the L5 nerve roots epidural tumor by Dr. Ellery Guthrie On 11/20/2023 -he received SBRT to low back in Feb 2025 -CT and abdominal MRI showed new enhancing lesion posteriorly in segment 7, suspicious for recurrence/metastasis, otherwise stable treated liver lesions and bone mets.  I discussed option of ablation versus SBRT radiation.  Patient prefers SBRT. -will reach out to Dr. Lorri Rota and change his Durvalumab  to carbozentinib when his LFT improves

## 2024-04-19 ENCOUNTER — Encounter: Payer: Self-pay | Admitting: Radiation Oncology

## 2024-04-19 ENCOUNTER — Ambulatory Visit
Admission: RE | Admit: 2024-04-19 | Discharge: 2024-04-19 | Disposition: A | Discharge status: Home / Self Care | Source: Ambulatory Visit | Attending: Radiation Oncology | Admitting: Radiation Oncology

## 2024-04-19 ENCOUNTER — Ambulatory Visit
Admission: RE | Admit: 2024-04-19 | Discharge: 2024-04-19 | Disposition: A | Discharge status: Home / Self Care | Source: Ambulatory Visit | Attending: Radiation Oncology

## 2024-04-19 VITALS — BP 128/61 | HR 65 | Temp 97.3°F | Resp 18 | Ht 67.0 in | Wt 130.5 lb

## 2024-04-19 DIAGNOSIS — C7951 Secondary malignant neoplasm of bone: Secondary | ICD-10-CM | POA: Diagnosis not present

## 2024-04-19 DIAGNOSIS — I10 Essential (primary) hypertension: Secondary | ICD-10-CM | POA: Diagnosis not present

## 2024-04-19 DIAGNOSIS — Z923 Personal history of irradiation: Secondary | ICD-10-CM | POA: Insufficient documentation

## 2024-04-19 DIAGNOSIS — Z7982 Long term (current) use of aspirin: Secondary | ICD-10-CM | POA: Insufficient documentation

## 2024-04-19 DIAGNOSIS — Z79899 Other long term (current) drug therapy: Secondary | ICD-10-CM | POA: Insufficient documentation

## 2024-04-19 DIAGNOSIS — C220 Liver cell carcinoma: Secondary | ICD-10-CM

## 2024-04-19 DIAGNOSIS — Z7952 Long term (current) use of systemic steroids: Secondary | ICD-10-CM | POA: Insufficient documentation

## 2024-04-19 DIAGNOSIS — N200 Calculus of kidney: Secondary | ICD-10-CM | POA: Insufficient documentation

## 2024-04-19 DIAGNOSIS — I700 Atherosclerosis of aorta: Secondary | ICD-10-CM | POA: Insufficient documentation

## 2024-04-19 NOTE — Progress Notes (Signed)
 Location of Tumor / Histology: Hepatocellular carcinoma    Corey Gillis Sr. presented as referral from [facility].   04/13/2024 Dr. Sonja Ponshewaing CT Chest Abdomen Pelvis with Contrast Clinical Data:   Hepatocellular carcinoma, assess treatment response * Tracking Code: BO *  IMPRESSION: 1. Unchanged ablation site of the peripheral right lobe of the liver, hepatic segment VI/VII. 2. Unchanged irregular calcified lesion of the posterior liver dome, hepatic segment VII. 3. Subtle new hypodense liver lesion of the posterior liver dome, hepatic segment VII measuring 1.1 x 0.9 cm, incompletely characterized by single phase CT although concerning for a new liver lesion. Please see separately reported MRI for further assessment. 4. Unchanged mixed lytic and sclerotic osseous metastasis of the posterior inferior endplate of L5. 5. No evidence of lymphadenopathy or metastatic disease in the chest. 6. Emphysema and smoking-related respiratory bronchiolitis. 7. Dense aortic valve calcifications. Correlate for echocardiographic evidence of aortic valve dysfunction. 8. Punctuate bilateral nonobstructive renal calculi. Small bladder calculus. 9. Coronary artery disease.   Aortic Atherosclerosis (ICD10-I70.0) and Emphysema (ICD10-J43.9).    Past/Anticipated interventions by medical oncology, if any:   Dr. Sonja [place] Assessment & Plan Hepatocellular carcinoma [facility]) HCC with a new 1.1 cm suspicious liver lesion. Previous lesions are well-managed. The new lesion is unlikely to cause elevated bilirubin. Treatment options include radiation or ablation. Immunotherapy discontinued due to suspected autoimmune hepatitis and new liver lesion. Radiation is preferred for its non-invasive nature and minimal side effects. If radiation is not feasible, ablation will be considered. Further systemic treatment with cabozantinib, an oral TKI, may be considered if local therapies are  insufficient and cancer progression is evident. - Consult Dr. Lorri Rota for potential radiation treatment. - If radiation is not feasible, consult Dr. Mercola for ablation. - Cancel current immunotherapy infusion. - Consider cabozantinib if local therapies are insufficient and cancer progression is detected.   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  Immunotherapy discontinued to allow liver function recovery. Steroids considered if liver function does not improve. Monitoring liver function is crucial to determine steroid necessity. - Monitor liver function tests. - Consider steroids if liver function does not improve. - Repeat labs in two weeks.   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 Monitoring liver function is essential to assess improvement. - Monitor liver function tests.   Bone metastasis Bone metastasis with previous treatments including ablation and radiation. No current active treatment planned for bone metastasis.   Plan - Lab reviewed, liver enzymes slightly improved, however bilirubin went up to 3.2 today. - I reviewed the CT and MRI scan findings with patient and his wife.  No biliary obstruction. - I recommend steroid for his abnormal liver function, I called in prednisone , will start 60 mg daily for 2 to 3 weeks, then gradually taper off in 4 to 6 weeks. - I also called in Protonix  -lab weekly and Follow-up in 2 weeks. - I referred him back to rad unk Dr. Lorri Rota for SBRT treatment of his new liver lesion.   Weight changes, if any: No  Bowel/Bladder complaints, if any: No   Nausea/Vomiting, if any: No  Pain issues, if any:  0/10  SAFETY ISSUES: Prior radiation? Yes, 12/15/23 - 12/25/23: L5-S1 (post-op) / 35 Gy in 5 fractions (UHRT) 06/17/23 - 06/27/23: L5 / 50 Gy  in 5 fractions (SBRT). Pacemaker/ICD? No Possible current  pregnancy? Male Is the patient on methotrexate? No  Current Complaints / other details:

## 2024-04-19 NOTE — Progress Notes (Signed)
 Radiation Oncology         [phone] ________________________________  Outpatient Re-Consultation  Name: Corey Kothari Sr. MRN: [id number]  Date of Service: 04/19/2024 DOB: 09/19/1956  HY:[id number], Skeet Duke, MD  Corey Altona, MD   REFERRING PHYSICIAN: Sonja Christiansburg, MD  DIAGNOSIS: 68 y.o. man with a new 1.1 cm posterior segment 7 liver lesion secondary to oligo progression of  hepatocellular carcinoma.     ICD-10-CM   1. Metastasis to bone (HCC)  C79.51     2. Hepatocellular carcinoma in adult [facility])  C22.0        HISTORY OF PRESENT ILLNESS: Corey Burkitt Sr. is a 68 y.o. male that is well known to our service, initially seen 06/13/23, at the request of Dr. Maryalice Smaller.   In summary, he was diagnosed with hepatocellular carcinoma in January of 2021 by MRI. He underwent microwave ablation under the care of Dr. Marne Sings in interventional radiology, on 03/01/20 and his disease remained stable up through July 2023.  A follow-up MRI on 06/01/22 was suspicious for local recurrence and metastatic disease to L5 and a biopsy of L5 confirmed metastatic HCC.  Therefore, the patient underwent repeat microwave ablation of the liver on 07/17/22 and was started on Lenvima  on 08/16/22 under the care of Dr. Maryalice Smaller. Unfortunately, the L5 bone met became painful and was shown to have increased in size on lumbar spine MRI on 06/07/23 so he was referred to us  to discuss potential palliative radiation therapy to the L5 lesion. He was subsequently treated with 5 fractions of SBRT, completed on 06/27/23 and tolerated very well with resolution of his pain. His systemic treatment was also changed to durvalumab  with one dose of Imjudo  on 06/30/23.  The patient developed RLE pain and weakness in 09/2023. When lumbar spine MRI showed no significant interval change in the L5 lesion, he was referred to Dr. Ellery Guthrie in neurosurgery.  A follow up lumbar spine CT on 10/28/23 showed a displaced osseous fragment that extended into  the spinal canal. He was taken for inpatient bilateral laminectoies at L5-S1 with decompression of the spinal canal and excision of epidural tumor surrounding the L5 nerve roots on 11/19/22, under the care of Dr. Ellery Guthrie.  Final surgical pathology from L5 again showed metastatic carcinoma consistent with known hepatocellular carcinoma.  He was referred back to us  on 12/05/23, and he subsequently received 5 fractions of postoperative UHRT to L5-S1, completed on 12/25/23 and has continued on durvalumab  under the care of Dr. Maryalice Smaller.  Most recently, he underwent restaging abdomen MRCP on 04/10/24 showing a new 1.1 cm enhancing lesion posteriorly in segment 7 of the liver, suspicious for recurrence/metastasis. Previous ablation zones and osseous metastasis at L5 were both stable.    A restaging CT C/A/P on 04/13/24 also showed the new, 1.1 cm liver lesion of the posterior liver dome, hepatic segment VII but no other new or progressive disease.  He has been kindly referred back to us  today to discuss the potential role for stereotactic body radiotherapy in the management of his oligo progressive disease.  PREVIOUS RADIATION THERAPY: Yes  12/15/23 - 12/25/23: L5-S1 (post-op) / 35 Gy in 5 fractions (UHRT)  06/17/23 - 06/27/23: L5 / 50 Gy in 5 fractions (SBRT)  PAST MEDICAL HISTORY:  Past Medical History:  Diagnosis Date   Headache    migraines   Hepatitis    HX OF hEP c ?    hepatocellular ca with bone mets 2021   Hypertension  Murmur, heart 1963      PAST SURGICAL HISTORY: Past Surgical History:  Procedure Laterality Date   BREAST BIOPSY Left 03/26/2024   US  LT BREAST BX W LOC DEV 1ST LESION IMG BX SPEC US  GUIDE 03/26/2024 [facility] MAMMOGRAPHY   DECOMPRESSIVE LUMBAR LAMINECTOMY LEVEL 1 N/A 11/20/2023   Procedure: lumbar laminectomy right Lumbar five with decompression of nerve roots and excision of tumor;  Surgeon: Elna Haggis, MD;  Location: [facility] OR;  Service: Neurosurgery;  Laterality: N/A;   IR RADIOLOGIST  EVAL & MGMT  01/25/2020   IR RADIOLOGIST EVAL & MGMT  03/21/2020   IR RADIOLOGIST EVAL & MGMT  06/07/2020   IR RADIOLOGIST EVAL & MGMT  09/05/2020   IR RADIOLOGIST EVAL & MGMT  12/05/2020   IR RADIOLOGIST EVAL & MGMT  05/09/2021   IR RADIOLOGIST EVAL & MGMT  09/06/2021   IR RADIOLOGIST EVAL & MGMT  12/12/2021   IR RADIOLOGIST EVAL & MGMT  06/25/2022   IR RADIOLOGIST EVAL & MGMT  07/24/2022   RADIOLOGY WITH ANESTHESIA N/A 03/01/2020   Procedure: CT WITH ANESTHESIA  THERMAL MICROWAVE  ABLATIION;  Surgeon: Fernando Hoyer, MD;  Location: [facility] ORS;  Service: Anesthesiology;  Laterality: N/A;   RADIOLOGY WITH ANESTHESIA N/A 07/17/2022   Procedure: CT MICROWAVE ABLATION;  Surgeon: Roxie Cord, MD;  Location: [facility] ORS;  Service: Radiology;  Laterality: N/A;    FAMILY HISTORY:  Family History  Problem Relation Age of Onset   Arthritis Mother    Cancer Neg Hx    Diabetes Neg Hx    Drug abuse Neg Hx    Heart disease Neg Hx    Hyperlipidemia Neg Hx    Hypertension Neg Hx    Kidney disease Neg Hx    Stroke Neg Hx     SOCIAL HISTORY:  Social History   Socioeconomic History   Marital status: Divorced    Spouse name: Not on file   Number of children: 2   Years of education: 12   Highest education level: 12th grade  Occupational History   Occupation: Clinical biochemist: [employer]    Comment: Out on disability for now  Tobacco Use   Smoking status: Former    Passive exposure: Current   Smokeless tobacco: Never   Tobacco comments:    3 per week  Vaping Use   Vaping status: Never Used  Substance and Sexual Activity   Alcohol use: Not Currently   Drug use: No   Sexual activity: Not Currently  Other Topics Concern   Not on file  Social History Narrative   Divorced but has relationship with ex   2 children   Daughter lives with him   Son in [place]      No living will   Ex wife Myraette should make decisions on his behalf   Would accept resuscitation   Not sure  about tube feeds   Social Drivers of Health   Financial Resource Strain: Medium Risk (11/06/2023)   Overall Financial Resource Strain (CARDIA)    Difficulty of Paying Living Expenses: Somewhat hard  Food Insecurity: No Food Insecurity (04/19/2024)   Hunger Vital Sign    Worried About Running Out of Food in the Last Year: Never true    Ran Out of Food in the Last Year: Never true  Transportation Needs: No Transportation Needs (04/19/2024)   PRAPARE - Administrator, Civil Service (Medical): No    Lack of Transportation (Non-Medical):  No  Physical Activity: Sufficiently Active (11/06/2023)   Exercise Vital Sign    Days of Exercise per Week: 7 days    Minutes of Exercise per Session: 30 min  Stress: No Stress Concern Present (11/06/2023)   [employer] of Occupational Health - Occupational Stress Questionnaire    Feeling of Stress : Only a little  Social Connections: Moderately Integrated (11/18/2023)   Social Connection and Isolation Panel [NHANES]    Frequency of Communication with Friends and Family: Three times a week    Frequency of Social Gatherings with Friends and Family: More than three times a week    Attends Religious Services: More than 4 times per year    Active Member of [employer] or Organizations: Yes    Attends Engineer, structural: More than 4 times per year    Marital Status: Divorced  Intimate Partner Violence: Not At Risk (04/19/2024)   Humiliation, Afraid, Rape, and Kick questionnaire    Fear of Current or Ex-Partner: No    Emotionally Abused: No    Physically Abused: No    Sexually Abused: No    ALLERGIES: Patient has no known allergies.  MEDICATIONS:  Current Outpatient Medications  Medication Sig Dispense Refill   acetaminophen  (TYLENOL ) 500 MG tablet Take 1,000 mg by mouth every 6 (six) hours as needed.     aspirin  EC 81 MG tablet Take 1 tablet (81 mg total) by mouth daily. Swallow whole. 30 tablet 12   atorvastatin  (LIPITOR) 40 MG  tablet Take 1 tablet (40 mg total) by mouth daily. 30 tablet 11   hydrALAZINE  (APRESOLINE ) 10 MG tablet TAKE 1 TABLET 2 (TWO) TIMES DAILY AS NEEDED. TAKE FOR SYSTOLIC BLOOD PRESSURE MORE THAN 170. 180 tablet 3   olmesartan  (BENICAR ) 20 MG tablet Take 1 tablet (20 mg total) by mouth daily. 30 tablet 11   pantoprazole  (PROTONIX ) 20 MG tablet Take 1 tablet (20 mg total) by mouth daily. Take when you are prednisone  30 tablet 1   predniSONE  (DELTASONE ) 20 MG tablet Take 3 tablets (60 mg total) by mouth daily with breakfast. Dose taper per provider after 2 weeks of treatment 90 tablet 0   No current facility-administered medications for this encounter.    REVIEW OF SYSTEMS:  On review of systems, the patient reports that he is doing well overall. He denies any chest pain, shortness of breath, cough, fevers, chills, night sweats, or recent unintended weight changes. He denies any bowel or bladder disturbances, and denies abdominal pain, nausea or vomiting. He has had significant improvement in his low back pain and denies any new musculoskeletal or joint aches or pains. He denies paraesthesias or weakness in the upper or lower extremities. A complete review of systems is obtained and is otherwise negative.    PHYSICAL EXAM:  Wt Readings from Last 3 Encounters:  04/19/24 130 lb 8 oz (59.2 kg)  04/15/24 128 lb 3.2 oz (58.2 kg)  04/07/24 128 lb 3.2 oz (58.2 kg)   Temp Readings from Last 3 Encounters:  04/19/24 (!) 97.3 F (36.3 C) (Temporal)  04/15/24 98 F (36.7 C) (Temporal)  04/07/24 98.3 F (36.8 C) (Temporal)   BP Readings from Last 3 Encounters:  04/19/24 128/61  04/15/24 (!) 132/58  04/07/24 (!) 130/56   Pulse Readings from Last 3 Encounters:  04/19/24 65  04/15/24 80  04/07/24 76   Pain Assessment Pain Score: 0-No pain/10  In general this is a well appearing African American man in no acute distress. He's  alert and oriented x4 and appropriate throughout the examination.  Cardiopulmonary assessment is negative for acute distress and he exhibits normal effort.     KPS = 70  100 - Normal; no complaints; no evidence of disease. 90   - Able to carry on normal activity; minor signs or symptoms of disease. 80   - Normal activity with effort; some signs or symptoms of disease. 88   - Cares for self; unable to carry on normal activity or to do active work. 60   - Requires occasional assistance, but is able to care for most of his personal needs. 50   - Requires considerable assistance and frequent medical care. 40   - Disabled; requires special care and assistance. 30   - Severely disabled; hospital admission is indicated although death not imminent. 20   - Very sick; hospital admission necessary; active supportive treatment necessary. 10   - Moribund; fatal processes progressing rapidly. 0     - Dead  Karnofsky DA, Abelmann WH, Craver LS and Burchenal [facility] [phone]) The use of the nitrogen mustards in the palliative treatment of carcinoma: with particular reference to bronchogenic carcinoma Cancer 1 [id number]  LABORATORY DATA:  Lab Results  Component Value Date   WBC 4.6 04/15/2024   HGB 9.5 (L) 04/15/2024   HCT 28.0 (L) 04/15/2024   MCV 87.2 04/15/2024   PLT 228 04/15/2024   Lab Results  Component Value Date   NA 140 04/15/2024   K 3.6 04/15/2024   CL 110 04/15/2024   CO2 24 04/15/2024   Lab Results  Component Value Date   ALT 199 (H) 04/15/2024   AST 366 (HH) 04/15/2024   ALKPHOS 1,438 (H) 04/15/2024   BILITOT 3.2 (H) 04/15/2024     RADIOGRAPHY: MR ABDOMEN MRCP W WO CONTAST Result Date: 04/14/2024 EXAM: MRCP WITH AND WITHOUT IV CONTRAST 04/10/2024 10:03:38 AM TECHNIQUE: Multisequence, multiplanar magnetic resonance images of the abdomen with and without intravenous contrast. MRCP sequences were performed. COMPARISON: CT abdomen/pelvis dated 01/07/2024. CLINICAL HISTORY: Gallbladder/biliary cancer, assess treatment response. Dx: Hepatocellular carcinoma  in adult (HCC) C22.0 (ICD-10-CM) FINDINGS: LIVER: Stable ablation zone at the junction of segment 5 / 6 (series 13 / image 33), without enhancement to suggest viable tumor. New enhancing lesion posteriorly in segment 7 measuring 1.7 x 1.4 cm (series 17/image 27), suspicious for recurrence/metastasis. Additional known calcified lesion medially in the right hepatic lobe is more conspicuous on CT. GALLBLADDER AND BILIARY SYSTEM: No intrahepatic or extrahepatic ductal dilatation. Common duct measures 14 mm. No choledocholithiasis is seen. SPLEEN: Unremarkable. PANCREAS/PANCREATIC DUCT: Visualized pancreas is unremarkable. No pancreatic ductal dilatation. ADRENAL GLANDS: Unremarkable. KIDNEYS: Simple bilateral renal cysts, measuring up to 2.4 cm in the interpolar right kidney (image 20), benign (Bosniak 1). No follow-up is recommended. LYMPH NODES: No enlarged abdominal lymph nodes. VASULATURE: Unremarkable. PERITONEUM: No ascites. ABDOMINAL WALL: No hernia. No mass. BOWEL: Grossly unremarkable. No bowel obstruction. BONES: Stable enhancing lesion at L5, corresponding to the patient's known osseous metastasis. SOFT TISSUES: Unremarkable. MISCELLANEOUS: Unremarkable. IMPRESSION: 1. New enhancing lesion posteriorly in segment 7, suspicious for recurrence/metastasis. 2. Stable ablation zone at the junction of segment 5/6. 3. Stable osseous metastasis at L5. Electronically signed by: Zadie Herter MD 04/14/2024 08:07 PM EDT RP Workstation: [id number]   MR 3D Recon At Scanner Result Date: 04/14/2024 EXAM: MRCP WITH AND WITHOUT IV CONTRAST 04/10/2024 10:03:38 AM TECHNIQUE: Multisequence, multiplanar magnetic resonance images of the abdomen with and without intravenous contrast. MRCP sequences were performed. COMPARISON: CT abdomen/pelvis  dated 01/07/2024. CLINICAL HISTORY: Gallbladder/biliary cancer, assess treatment response. Dx: Hepatocellular carcinoma in adult (HCC) C22.0 (ICD-10-CM) FINDINGS: LIVER: Stable ablation  zone at the junction of segment 5 / 6 (series 13 / image 33), without enhancement to suggest viable tumor. New enhancing lesion posteriorly in segment 7 measuring 1.7 x 1.4 cm (series 17/image 27), suspicious for recurrence/metastasis. Additional known calcified lesion medially in the right hepatic lobe is more conspicuous on CT. GALLBLADDER AND BILIARY SYSTEM: No intrahepatic or extrahepatic ductal dilatation. Common duct measures 14 mm. No choledocholithiasis is seen. SPLEEN: Unremarkable. PANCREAS/PANCREATIC DUCT: Visualized pancreas is unremarkable. No pancreatic ductal dilatation. ADRENAL GLANDS: Unremarkable. KIDNEYS: Simple bilateral renal cysts, measuring up to 2.4 cm in the interpolar right kidney (image 20), benign (Bosniak 1). No follow-up is recommended. LYMPH NODES: No enlarged abdominal lymph nodes. VASULATURE: Unremarkable. PERITONEUM: No ascites. ABDOMINAL WALL: No hernia. No mass. BOWEL: Grossly unremarkable. No bowel obstruction. BONES: Stable enhancing lesion at L5, corresponding to the patient's known osseous metastasis. SOFT TISSUES: Unremarkable. MISCELLANEOUS: Unremarkable. IMPRESSION: 1. New enhancing lesion posteriorly in segment 7, suspicious for recurrence/metastasis. 2. Stable ablation zone at the junction of segment 5/6. 3. Stable osseous metastasis at L5. Electronically signed by: Zadie Herter MD 04/14/2024 08:07 PM EDT RP Workstation: [id number]   CT CHEST ABDOMEN PELVIS W CONTRAST Result Date: 04/14/2024 CLINICAL DATA:  Hepatocellular carcinoma, assess treatment response * Tracking Code: BO * EXAM: CT CHEST, ABDOMEN, AND PELVIS WITH CONTRAST TECHNIQUE: Multidetector CT imaging of the chest, abdomen and pelvis was performed following the standard protocol during bolus administration of intravenous contrast. RADIATION DOSE REDUCTION: This exam was performed according to the departmental dose-optimization program which includes automated exposure control, adjustment of the mA  and/or kV according to patient size and/or use of iterative reconstruction technique. CONTRAST:  OMNIPAQUE  IOHEXOL  300 MG/ML  SOLN COMPARISON:  01/07/2024 FINDINGS: CT CHEST FINDINGS Cardiovascular: Aortic atherosclerosis. Dense aortic valve calcifications. Normal heart size. No pericardial effusion. Mediastinum/Nodes: No enlarged mediastinal, hilar, or axillary lymph nodes. Thyroid  gland, trachea, and esophagus demonstrate no significant findings. Lungs/Pleura: Mild, predominantly paraseptal emphysema. Background of fine centrilobular nodularity most concentrated in the apices. Dependent bibasilar scarring or atelectasis. No pleural effusion or pneumothorax. Musculoskeletal: No chest wall abnormality. No acute osseous findings. CT ABDOMEN PELVIS FINDINGS Hepatobiliary: Unchanged ablation site of the peripheral right lobe of the liver, hepatic segment VI/VII, measuring 2.6 x 2.3 cm (series 2, image 62). Unchanged irregular calcified lesion of the posterior liver dome, hepatic segment VII (series 2, image 52). Subtle new hypodense liver lesion of the posterior liver dome, hepatic segment VII measuring 1.1 x 0.9 cm (series 2, image 57). No gallstones, gallbladder wall thickening, or biliary dilatation. Pancreas: Unremarkable. No pancreatic ductal dilatation or surrounding inflammatory changes. Spleen: Normal in size without significant abnormality. Adrenals/Urinary Tract: Adrenal glands are unremarkable. Punctuate bilateral nonobstructive renal calculi. No ureteral calculi or hydronephrosis. Simple, benign bilateral renal cortical cysts, for which no further follow-up or characterization is required. Redemonstrated calculus in the bladder measuring 0.7 cm (series 2, image 111). Stomach/Bowel: Stomach is within normal limits. Appendix appears normal. No evidence of bowel wall thickening, distention, or inflammatory changes. Vascular/Lymphatic: Aortic atherosclerosis. No enlarged abdominal or pelvic lymph nodes.  Reproductive: No mass or other abnormality. Other: No abdominal wall hernia or abnormality. No ascites. Musculoskeletal: No acute osseous findings. Unchanged mixed lytic and sclerotic osseous metastasis of the posterior inferior endplate of L5 (series 5, image 88). IMPRESSION: 1. Unchanged ablation site of the peripheral right lobe of the  liver, hepatic segment VI/VII. 2. Unchanged irregular calcified lesion of the posterior liver dome, hepatic segment VII. 3. Subtle new hypodense liver lesion of the posterior liver dome, hepatic segment VII measuring 1.1 x 0.9 cm, incompletely characterized by single phase CT although concerning for a new liver lesion. Please see separately reported MRI for further assessment. 4. Unchanged mixed lytic and sclerotic osseous metastasis of the posterior inferior endplate of L5. 5. No evidence of lymphadenopathy or metastatic disease in the chest. 6. Emphysema and smoking-related respiratory bronchiolitis. 7. Dense aortic valve calcifications. Correlate for echocardiographic evidence of aortic valve dysfunction. 8. Punctuate bilateral nonobstructive renal calculi. Small bladder calculus. 9. Coronary artery disease. Aortic Atherosclerosis (ICD10-I70.0) and Emphysema (ICD10-J43.9). Electronically Signed   By: Fredricka Jenny M.D.   On: 04/14/2024 10:05   US  Abdomen Complete Result Date: 04/07/2024 CLINICAL DATA:  Hyperbilirubinemia EXAM: ABDOMEN ULTRASOUND COMPLETE COMPARISON:  Ultrasound July 2020.  CT 01/07/2024 FINDINGS: Gallbladder: Dilated gallbladder. No shadowing stones or ductal dilatation. No reported sonographic Murphy's sign. Common bile duct: Diameter: 13 mm, enlarged. Diameter on previous CT of 13 mm as well. Liver: Homogeneous hepatic parenchyma. There is a cystic focus peripherally in the right hepatic lobe on today's examination measuring 3.1 x 2.6 x 2.6 cm. On prior CT scan this measured 2.6 cm. This has some areas of shadowing. Not clearly a simple cyst. There is also  hypoechoic focus in left hepatic lobe measuring 2.1 cm. Not clearly seen on the prior examination. Please correlate for history of prior ablation with history of HCC. Portal vein is patent on color Doppler imaging with normal direction of blood flow towards the liver. IVC: No abnormality visualized. Pancreas: Incompletely seen with overlapping bowel gas and soft tissue. Visualized portions are preserved. Spleen: Size and appearance within normal limits. Right Kidney: Length: 11.3 cm in length. Heterogeneous renal parenchyma. There is a central cyst identified, unchanged from previous measuring 2.7 cm. No collecting system dilatation Left Kidney: Length: 12.1 cm. Echogenicity within normal limits. No mass or hydronephrosis visualized. Abdominal aorta: No aneurysm visualized. Other findings: None. IMPRESSION: Dilated gallbladder. No obvious stones. There is also an enlarged common duct but this was seen previously. If there is further concern of the patient's symptoms additional MRI evaluation may be of some benefit to further delineate. Complex liver lesions are again identified. One which was not clearly seen previously. Please correlate with history of ablation. Again recommend further evaluation with pre and postcontrast MRI when appropriate. No renal collecting system dilatation. Heterogeneous appearance of the right kidney. Please correlate for any history of renal dysfunction or signs of infectious or inflammatory process. There was some atrophy on the prior CT scan. Contrast CT scan may be of some benefit to further delineate when appropriate. Electronically Signed   By: Adrianna Horde M.D.   On: 04/07/2024 15:06   US  LT BREAST BX W LOC DEV 1ST LESION IMG BX SPEC US  GUIDE Addendum Date: 03/31/2024 ADDENDUM REPORT: 03/31/2024 07:34 ADDENDUM: PATHOLOGY revealed: 1. 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 NEGATIVE FOR ATYPIA OR MALIGNANCY. Pathology results are CONCORDANT with imaging findings, per Dr. Amanda Jungling. Pathology results and recommendations were discussed with patient via telephone on 03/29/2024 by Ladonna Pickup RN. Patient reported biopsy site doing well with no adverse symptoms, and slight tenderness at the site. Post biopsy care instructions were reviewed, questions were answered and my direct phone number was provided.  Patient was instructed to call [facility] for any additional questions or concerns related to biopsy site. Recommendation: Patient instructed to follow-up with provider for subsequent concerns. Pathology results reported by Ladonna Pickup RN on 03/29/2024. Electronically Signed   By: Amanda Jungling M.D.   On: 03/31/2024 07:34   Result Date: 03/31/2024 CLINICAL DATA:  Patient presents for ultrasound-guided core needle biopsy of a left breast mass. EXAM: ULTRASOUND GUIDED LEFT BREAST CORE NEEDLE BIOPSY COMPARISON:  Previous exam(s). PROCEDURE: I met with the patient and we discussed the procedure of ultrasound-guided biopsy, including benefits and alternatives. We discussed the high likelihood of a successful procedure. We discussed the risks of the procedure, including infection, bleeding, tissue injury, clip migration, and inadequate sampling. Informed written consent was given. The usual time-out protocol was performed immediately prior to the procedure. Lesion location: Retroareolar Using sterile technique and 1% Lidocaine  as local anesthetic, under direct ultrasound visualization, a 12 gauge spring-loaded device was used to perform biopsy of the ill-defined retroareolar left breast mass using an inferior approach. At the conclusion of the procedure a HydroMARK, spiral shaped tissue marker clip was deployed into the biopsy cavity. Follow up 2 view mammogram was not performed. IMPRESSION: Ultrasound guided biopsy of a left breast mass.  No apparent complications. Electronically Signed: By: Amanda Jungling M.D. On: 03/26/2024 10:57      IMPRESSION/PLAN: 1. 68 y.o. man with a new 1.1 cm posterior segment 7 liver lesion secondary to oligo progression of his known metastatic hepatocellular carcinoma.  Today, we talked to the patient and his wife, Myraette, about the findings and workup thus far. We discussed the natural history of oligoprogressive metastatic hepatocellular carcinoma and general treatment, highlighting the role of radiotherapy in the management. We discussed the available radiation techniques, and focused on the details and logistics of delivery. The recommendation is for a 5 fraction course of stereotactic body radiotherapy (SBRT) to the new 1 cm lesion in the posterior liver dome. We reviewed the anticipated acute and late sequelae associated with radiation in this setting. The patient was encouraged to ask questions that were answered to his stated satisfaction.  At the conclusion of our conversation, the patient is in agreement to proceed with the recommended 5 fraction course of stereotactic body radiotherapy (SBRT) to the new 1 cm lesion in the posterior liver dome.  He has freely signed written consent to proceed today in the office and a copy of this document will be placed in his medical record.  He is tentatively scheduled for CT simulation/treatment planning at 8 AM on Thursday, 04/22/2024, in anticipation of beginning his treatments in the near future, so we will share our discussion with Dr. Maryalice Smaller and look forward to continuing to participate in his care.  He and his wife know that they are welcome to call with any questions or concerns in the interim.  We personally spent 60 minutes in this encounter including chart review, reviewing radiological studies, meeting face-to-face with the patient, entering orders and completing documentation.    Arta Bihari, PA-C    Kenith Payer, MD  [facility]   Radiation Oncology Direct Dial: [phone]  Fax: [phone] .com  Skype  LinkedIn   This document serves as a record of services personally performed by Kenith Payer, MD and Keitha Pata, PA-C. It was created on their behalf by Florance Hun, a trained medical scribe. The creation of this record is based on the scribe's personal observations and the provider's statements to  them. This document has been checked and approved by the attending provider.

## 2024-04-21 ENCOUNTER — Other Ambulatory Visit: Payer: Self-pay

## 2024-04-22 ENCOUNTER — Inpatient Hospital Stay

## 2024-04-22 ENCOUNTER — Ambulatory Visit
Admission: RE | Admit: 2024-04-22 | Discharge: 2024-04-22 | Disposition: A | Discharge status: Home / Self Care | Source: Ambulatory Visit | Attending: Urology | Admitting: Urology

## 2024-04-22 DIAGNOSIS — R17 Unspecified jaundice: Secondary | ICD-10-CM | POA: Insufficient documentation

## 2024-04-22 DIAGNOSIS — Z79899 Other long term (current) drug therapy: Secondary | ICD-10-CM | POA: Insufficient documentation

## 2024-04-22 DIAGNOSIS — C220 Liver cell carcinoma: Secondary | ICD-10-CM | POA: Insufficient documentation

## 2024-04-22 DIAGNOSIS — Z87891 Personal history of nicotine dependence: Secondary | ICD-10-CM | POA: Insufficient documentation

## 2024-04-22 DIAGNOSIS — M21379 Foot drop, unspecified foot: Secondary | ICD-10-CM | POA: Insufficient documentation

## 2024-04-22 DIAGNOSIS — N281 Cyst of kidney, acquired: Secondary | ICD-10-CM | POA: Insufficient documentation

## 2024-04-22 DIAGNOSIS — Z7982 Long term (current) use of aspirin: Secondary | ICD-10-CM | POA: Insufficient documentation

## 2024-04-22 DIAGNOSIS — R911 Solitary pulmonary nodule: Secondary | ICD-10-CM | POA: Insufficient documentation

## 2024-04-22 DIAGNOSIS — M7989 Other specified soft tissue disorders: Secondary | ICD-10-CM | POA: Insufficient documentation

## 2024-04-22 DIAGNOSIS — C7951 Secondary malignant neoplasm of bone: Secondary | ICD-10-CM | POA: Insufficient documentation

## 2024-04-22 DIAGNOSIS — N200 Calculus of kidney: Secondary | ICD-10-CM | POA: Insufficient documentation

## 2024-04-22 DIAGNOSIS — R011 Cardiac murmur, unspecified: Secondary | ICD-10-CM | POA: Insufficient documentation

## 2024-04-22 DIAGNOSIS — I2510 Atherosclerotic heart disease of native coronary artery without angina pectoris: Secondary | ICD-10-CM | POA: Insufficient documentation

## 2024-04-22 DIAGNOSIS — R600 Localized edema: Secondary | ICD-10-CM | POA: Insufficient documentation

## 2024-04-22 DIAGNOSIS — I10 Essential (primary) hypertension: Secondary | ICD-10-CM | POA: Insufficient documentation

## 2024-04-22 DIAGNOSIS — Z7952 Long term (current) use of systemic steroids: Secondary | ICD-10-CM | POA: Insufficient documentation

## 2024-04-22 DIAGNOSIS — I700 Atherosclerosis of aorta: Secondary | ICD-10-CM | POA: Insufficient documentation

## 2024-04-22 DIAGNOSIS — Z510 Encounter for antineoplastic radiation therapy: Secondary | ICD-10-CM | POA: Diagnosis not present

## 2024-04-22 DIAGNOSIS — K7460 Unspecified cirrhosis of liver: Secondary | ICD-10-CM | POA: Insufficient documentation

## 2024-04-22 LAB — CBC WITH DIFFERENTIAL (CANCER CENTER ONLY)
Abs Immature Granulocytes: 0.03 10*3/uL (ref 0.00–0.07)
Basophils Absolute: 0 10*3/uL (ref 0.0–0.1)
Basophils Relative: 0 %
Eosinophils Absolute: 0 10*3/uL (ref 0.0–0.5)
Eosinophils Relative: 0 %
HCT: 30 % — ABNORMAL LOW (ref 39.0–52.0)
Hemoglobin: 9.8 g/dL — ABNORMAL LOW (ref 13.0–17.0)
Immature Granulocytes: 0 %
Lymphocytes Relative: 8 %
Lymphs Abs: 0.5 10*3/uL — ABNORMAL LOW (ref 0.7–4.0)
MCH: 30.2 pg (ref 26.0–34.0)
MCHC: 32.7 g/dL (ref 30.0–36.0)
MCV: 92.6 fL (ref 80.0–100.0)
Monocytes Absolute: 0.6 10*3/uL (ref 0.1–1.0)
Monocytes Relative: 8 %
Neutro Abs: 5.8 10*3/uL (ref 1.7–7.7)
Neutrophils Relative %: 84 %
Platelet Count: 306 10*3/uL (ref 150–400)
RBC: 3.24 MIL/uL — ABNORMAL LOW (ref 4.22–5.81)
RDW: 19.6 % — ABNORMAL HIGH (ref 11.5–15.5)
WBC Count: 6.9 10*3/uL (ref 4.0–10.5)
nRBC: 0 % (ref 0.0–0.2)

## 2024-04-22 LAB — CMP (CANCER CENTER ONLY)
ALT: 118 U/L — ABNORMAL HIGH (ref 0–44)
AST: 96 U/L — ABNORMAL HIGH (ref 15–41)
Albumin: 3.4 g/dL — ABNORMAL LOW (ref 3.5–5.0)
Alkaline Phosphatase: 1135 U/L — ABNORMAL HIGH (ref 38–126)
Anion gap: 5 (ref 5–15)
BUN: 14 mg/dL (ref 8–23)
CO2: 26 mmol/L (ref 22–32)
Calcium: 9.1 mg/dL (ref 8.9–10.3)
Chloride: 111 mmol/L (ref 98–111)
Creatinine: 1.07 mg/dL (ref 0.61–1.24)
GFR, Estimated: >60 mL/min (ref >60)
Glucose, Bld: 135 mg/dL — ABNORMAL HIGH (ref 70–99)
Potassium: 3.7 mmol/L (ref 3.5–5.1)
Sodium: 142 mmol/L (ref 135–145)
Total Bilirubin: 1.6 mg/dL — ABNORMAL HIGH (ref 0.0–1.2)
Total Protein: 7 g/dL (ref 6.5–8.1)

## 2024-04-26 NOTE — Progress Notes (Unsigned)
 [facility]   Telephone:([phone]) [phone] Fax:([phone]) [phone]    Patient Care Team: [name], MD as PCP - General (Internal Medicine) [name], MD as PCP - Cardiology (Cardiology) Sonja Powhatan, MD as Attending Physician (Hematology and Oncology) [name], Giles Labrum, NP as Nurse Practitioner (Hospice and Palliative Medicine) Imaging, [facility] as Radiologist (Diagnostic Radiology)   CHIEF COMPLAINT: Follow up [facility]  Oncology History  Hepatocellular carcinoma in adult [facility])  12/08/2019 Imaging   MR ABDOMEN WWO CONTRAST   IMPRESSION: Two small masses in the right hepatic lobe which have characteristics diagnostic for hepatocellular carcinoma in the setting of cirrhosis. (LI-RADS Category 5: Definitely HCC)   No evidence of abdominal metastatic disease or other significant abnormality.   03/01/2020 Initial Diagnosis   Hepatocellular carcinoma (HCC)   03/01/2020 Procedure   CT GUIDE TISSUE ABLATION     IMPRESSION: Successful percutaneous thermal ablation.   Signed,   Roxie Cord, MD, RPVI   06/02/2020 Imaging   MR ABDOMEN WWO CONTRAST   IMPRESSION: 1. There has been interval ablation at two adjacent sites of the subcapsular right lobe of the liver, hepatic segment VI. There is no residual contrast enhancement at these adjacent sites. LI-RADS category 5 TR, nonviable. 2. There are scattered, subcentimeter foci of arterial hyperenhancement of the anterior right lobe of the liver, for example adjacent to the ablation site measuring 3 mm and in the medial anterior right lobe of the liver measuring 6 mm. This larger focus is unchanged compared to prior. Others were not previously seen. These findings are nonspecific and consistent with LI-RADS category 3. Attention on follow-up.   09/02/2020 Imaging   MR ABDOMEN WWO CONTRAST     IMPRESSION: 1. Post radiofrequency ablation along the margin of the RIGHT  hemi liver. Heterogeneous appearance of this area on both T1 and T2. No sign of residual enhancement. LR TR nonviable. 2. Very subtle focus of restricted diffusion in the cephalad lateral segment LEFT hepatic lobe measuring 7 mm, corresponding to low signal on image 27 of series 21. Arterial enhancement is not seen in this area and this areas not seen on prior imaging. Not clear whether this represents a new lesion as this area is obscured largely by artifact on most sequences on today's study and on previous imaging evaluations. LR category 3, consider a 3 to six-month follow-up with abdominal MRI. 3. Lesions of concern scattered about the RIGHT hepatic lobe may represent perfusional anomalies as they were not seen on today's study. 4. Stable mild dilation of the biliary tree.   11/27/2020 Imaging   MR ABDOMEN WWO CONTRAST   IMPRESSION: Minimal decrease in size of ablation defect in the peripheral anterior right hepatic lobe. No evidence of locally recurrent hepatocellular carcinoma or other suspicious liver lesions.   No evidence of metastatic disease or other acute findings.   04/03/2021 Imaging   MR ABDOMEN WWO CONTRAST   IMPRESSION: Stable ablation defect in the lateral right hepatic lobe, without evidence of recurrent tumor at this site.   5 mm hypovascular lesion in segment 5 of the right lobe was not definitely seen on previous study, but is too small to characterize. This shows no arterial phase hyperenhancement. Recommend continued follow-up by MRI in 6 months.   No evidence of abdominal metastatic disease.        09/03/2021 Imaging   MR ABDOMEN WWO CONTRAST   IMPRESSION: 1. Side-by-side ablation sites in the right hepatic  lobe in segment 6. The more anterior site remains largely cystic in appearance and no findings suspicious recurrent tumor in this area. There is however abnormal contrast enhancement around the larger more posterior and lateral ablation  site worrisome for recurrent tumor. 2. Several small foci of early arterial phase enhancement in the liver. The peripheral lesions are likely vascular shunts. The more central lesions are likely dysplastic nodules. Recommend continued surveillance. 3. Stable bilateral renal cysts.   12/07/2021 Imaging   MR ABDOMEN WWO CONTRAST  IMPRESSION: 1. Ablation defects in the right lobe of the liver, similar to the prior study, without definitive evidence to suggest local recurrence of disease or metastatic disease. 2. Multiple tiny arterial phase areas of nodular hyperenhancement scattered throughout the liver, predominantly in the right lobe of the liver, without perceptible signal abnormality or diffusion restriction on other pulse sequences, nonspecific, and likely small benign areas of vascular shunting. Continued attention on follow-up imaging is recommended. 3. Multiple Bosniak class 1 and Bosniak class 2 cysts in the kidneys bilaterally, similar to the prior study, as above. 4. Aortic atherosclerosis.     06/01/2022 Imaging   MR ABDOMEN WWO CONTRAST   IMPRESSION: 1. LR TR equivocal but suspicious findings with respect to previously treated disease in the RIGHT hepatic lobe given nodular enhancement adjacent and anterior superior to the ablation zone as described. 2. Given lack of late arterial phase due to bolus timing a second area of abnormality within the liver is suspicious for additional site of hepatocellular carcinoma showing washout appearance and capsule appearance on current imaging, cardiac pulsation limiting assessment in the area of concern. 3. Suspect bony metastatic disease in the lower lumbar spine at L5 level. Dedicated spinal imaging may be helpful for further evaluation.     06/18/2022 Imaging   MR Lumbar Spine W Wo Contrast   IMPRESSION: L5 vertebral body lesion is most consistent with a metastasis. There is probable trace ventral epidural extension    07/17/2022 Procedure   CT GUIDE TISSUE ABLATION   IMPRESSION: Successful CT-guided microwave ablation of recurrent hepatocellular carcinoma.   07/17/2022 Procedure   CT BONE TROCAR/NEEDLE BIOPSY SUPERFICIAL   IMPRESSION: Successful CT-guided core biopsy of L5 bone lesion.      07/17/2022 Pathology Results   SURGICAL PATHOLOGY  CASE: [id number]  PATIENT: Corey Armstrong  Surgical Pathology Report   SURGICAL PATHOLOGY  CASE: [id number]  PATIENT: Corey Armstrong  Surgical Pathology Report      Clinical History: Hx of cirrhosis and small HCC with unexpected  suspicious lesion at L5. Rare HCC met to bone vs atypical hemangioma or  met from new primary. (crm)      FINAL MICROSCOPIC DIAGNOSIS:   A. 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 No acute findings within the chest. No suspicious findings for metastatic disease within the chest. 2. Unchanged 3 mm nodule within the medial right upper lobe. No new or suspicious lung nodules. 3. Suboptimal visualization of the known liver lesions, reflecting lack of IV contrast material. Similar appearance of ablation defect within the lateral right lobe of liver. 4. Left renal calculi. 5. Coronary artery calcifications. 6.  Aortic Atherosclerosis   06/25/2023 Imaging   NM Bone Scan whole body  IMPRESSION: 1. Known L5 metastasis demonstrates low level uptake, mildly increased from previous bone scan. 2. No new lesions  demonstrated.   06/30/2023 - 03/10/2024 Chemotherapy   Patient is on Treatment Plan : Hepatocellular Carcinoma Tremelimumab -actl C1 D1 + Durvalumab  q28d       Imaging        CURRENT THERAPY: Imjudo  C1D1 only/ Durvalumab  q28 days starting 06/30/23 - 03/10/24. Pending SBRT to oligo liver progression   INTERVAL HISTORY Mr. 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 LFT trend has improved. Now taking 2 tabs prednisone . Feels much better in general and has been more active. Eating/drinking well. Intermittent diarrhea managed with imodium. Denies pain except some tightness where he had back surgery. He is scheduled to start SBRT next week.   ROS  All other systems reviewed and negative  Past Medical History:  Diagnosis Date   Headache    migraines   Hepatitis    HX OF hEP c ?    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     Past Surgical History:  Procedure Laterality Date   BREAST BIOPSY Left 03/26/2024   US  LT BREAST BX W LOC DEV 1ST LESION IMG BX SPEC US  GUIDE 03/26/2024 [facility] MAMMOGRAPHY   DECOMPRESSIVE LUMBAR LAMINECTOMY LEVEL 1 N/A 11/20/2023   Procedure: lumbar laminectomy right Lumbar five with decompression of nerve roots and excision of tumor;  Surgeon: Elna Haggis, MD;  Location: [facility] OR;  Service: Neurosurgery;  Laterality: N/A;   IR RADIOLOGIST EVAL & MGMT  01/25/2020   IR RADIOLOGIST EVAL & MGMT  03/21/2020   IR RADIOLOGIST EVAL & MGMT  06/07/2020   IR RADIOLOGIST EVAL & MGMT  09/05/2020   IR RADIOLOGIST EVAL & MGMT  12/05/2020   IR RADIOLOGIST EVAL & MGMT  05/09/2021   IR RADIOLOGIST EVAL & MGMT  09/06/2021   IR RADIOLOGIST EVAL & MGMT  12/12/2021   IR RADIOLOGIST EVAL & MGMT  06/25/2022   IR RADIOLOGIST EVAL & MGMT  07/24/2022   RADIOLOGY WITH ANESTHESIA N/A 03/01/2020   Procedure: CT WITH ANESTHESIA  THERMAL MICROWAVE  ABLATIION;  Surgeon: Fernando Hoyer, MD;  Location: [facility] ORS;  Service: Anesthesiology;  Laterality: N/A;   RADIOLOGY WITH ANESTHESIA N/A 07/17/2022   Procedure: CT MICROWAVE ABLATION;  Surgeon: Roxie Cord, MD;  Location: [facility] ORS;  Service: Radiology;  Laterality: N/A;     Outpatient Encounter Medications as of 04/27/2024  Medication Sig   acetaminophen  (TYLENOL ) 500 MG  tablet Take 1,000 mg by mouth every 6 (six) hours as needed.   aspirin  EC 81 MG tablet Take 1 tablet (81 mg total) by mouth daily. Swallow whole.   atorvastatin  (LIPITOR) 40 MG tablet Take 1 tablet (40 mg total) by mouth daily.   hydrALAZINE  (APRESOLINE ) 10 MG tablet TAKE 1 TABLET 2 (TWO) TIMES DAILY AS NEEDED. TAKE FOR SYSTOLIC BLOOD PRESSURE MORE THAN 170.   olmesartan  (BENICAR ) 20 MG tablet Take 1 tablet (20 mg total) by mouth daily.   pantoprazole  (PROTONIX ) 20 MG tablet Take 1 tablet (20 mg total) by mouth daily. Take when you are prednisone    predniSONE  (DELTASONE ) 20 MG tablet Take 3 tablets (60 mg total) by mouth daily with breakfast. Dose taper per provider after 2 weeks of treatment   No facility-administered encounter medications on file as of 04/27/2024.     Today's Vitals   04/27/24 0834 04/27/24 0836  BP: (!) 140/60 136/60  Pulse: 71   Resp: 17   Temp: 98.1 F (36.7 C)  TempSrc: Temporal   SpO2: 99%   Weight: 133 lb 6.4 oz (60.5 kg)   Height: 5' 7 (1.702 m)   PainSc: 0-No pain    Body mass index is 20.89 kg/m.   ECOG PERFORMANCE STATUS: 1 - Symptomatic but completely ambulatory  PHYSICAL EXAM GENERAL:alert, no distress and comfortable SKIN: no rash  EYES: sclera clear NECK: without mass LYMPH:  no palpable cervical or supraclavicular lymphadenopathy  LUNGS: clear with normal breathing effort HEART: regular rate & rhythm, +murmur, no lower extremity edema. L leg brace ABDOMEN: abdomen soft, non-tender and normal bowel sounds NEURO: alert & oriented x 3 with fluent speech, no focal motor/sensory deficits   CBC    Latest Ref Rng & Units 04/27/2024    7:44 AM 04/22/2024    9:01 AM 04/15/2024    9:32 AM  CBC  WBC 4.0 - 10.5 K/uL 6.4  6.9  4.6   Hemoglobin 13.0 - 17.0 g/dL 9.6  9.8  9.5   Hematocrit 39.0 - 52.0 % 30.7  30.0  28.0   Platelets 150 - 400 K/uL 242  306  228       CMP     Latest Ref Rng & Units 04/27/2024    7:44 AM 04/22/2024    9:01 AM  04/15/2024    9:32 AM  CMP  Glucose 70 - 99 mg/dL 95  956  213   BUN 8 - 23 mg/dL 19  14  12    Creatinine 0.61 - 1.24 mg/dL 0.86  5.78  4.69   Sodium 135 - 145 mmol/L 147  142  140   Potassium 3.5 - 5.1 mmol/L 4.4  3.7  3.6   Chloride 98 - 111 mmol/L 114  111  110   CO2 22 - 32 mmol/L 27  26  24    Calcium  8.9 - 10.3 mg/dL 9.0  9.1  9.1   Total Protein 6.5 - 8.1 g/dL 6.6  7.0  7.5   Total Bilirubin 0.0 - 1.2 mg/dL 1.2  1.6  3.2   Alkaline Phos 38 - 126 U/L 769  1,135  1,438   AST 15 - 41 U/L 85  96  366   ALT 0 - 44 U/L 94  118  199       ASSESSMENT & PLAN:Corey Levon Anger Sr. is a 68 y.o. male with    Hepatocellular carcinoma -diagnosed 11/2019 by MRI for f/u of cirrhosis. S/p microwave ablation on 03/01/20. -MRI 06/01/22 showed suspicion for local recurrence and metastasis at L5. Biopsy of L5 confirmed metastatic HCC. S/p repeat microwave ablation on 07/17/22. -started lenvima  08/16/22 -Liver MRI on 04/23/2023 showed stable treated liver lesion, L5 bone mets has slightly increased in size.  Patient developed worsening low back pain in July 2024, repeated lumbar MRI showed significant disease progression in the L5 bone lesion.  -S/p palliative radiation to his L5 lesion for pain control 06/17/23 - 06/27/23 -Due to progression and TKI failure, he changed to Durvalumab  and Imjudo  06/30/23 - 03/10/24 -Recent CT/MRI showed new enhancing lesion posteriorly in segment 7, suspicious for recurrence/metastasis. Pending SBRT by Dr. Lorri Rota to start 6/25.  - Mr. Stines appears well, LFTs improved and T. bili normalized on prednisone  starting 6/5 with 60 mg daily, he just decreased to 40 mg daily and will continue until next visit.  - Continue close monitoring and follow-up with lab and office visit 6/27   Metastatic cancer to bone, back and bilateral lower leg pain -S/p palliative RT to  L5 bone met 06/17/23 - 06/27/23 -On gabapentin , oxycodone , and mobic  per palliative care team -S/p bilateral  decompression of spinal canal at L5 nerve roots epidural tumor ([place]) 11/20/23 and SBRT to low back 12/2023       PLAN: -Labs reviewed -Continue prednisone  taper (40 mg daily until 6/27, then decrease) -Begin SBRT and palliative care f/up 6/25 -Lab and f/up with Dr. Maryalice Smaller 6/27   All questions were answered. The patient knows to call the clinic with any problems, questions or concerns. No barriers to learning were detected.  Shakiera Edelson K Marwin Primmer, NP 04/27/2024

## 2024-04-26 NOTE — Progress Notes (Signed)
  Radiation Oncology         [phone]) [phone] ________________________________  Name: Corey Gillis Sr. MRN: [id number]  Date: 04/22/2024  DOB: 11-Apr-1956  STEREOTACTIC BODY RADIOTHERAPY SIMULATION AND TREATMENT PLANNING NOTE    ICD-10-CM   1. Hepatocellular carcinoma in adult [facility])  C22.0       DIAGNOSIS:  68 y.o. man with a new 1.1 cm posterior segment 7 liver lesion secondary to oligo progression of  hepatocellular carcinoma.   NARRATIVE:  The patient was brought to the CT Simulation planning suite.  Identity was confirmed.  All relevant records and images related to the planned course of therapy were reviewed.  The patient freely provided informed written consent to proceed with treatment after reviewing the details related to the planned course of therapy. The consent form was witnessed and verified by the simulation staff.  Then, the patient was set-up in a stable reproducible  supine position for radiation therapy.  A BodyFix immobilization pillow was fabricated for reproducible positioning.  Surface markings were placed.  The CT images were loaded into the planning software.  The gross target volumes (GTV) and planning target volumes (PTV) were delinieated, and avoidance structures were contoured.  Treatment planning then occurred.  The radiation prescription was entered and confirmed.  A total of two complex treatment devices were fabricated in the form of the BodyFix immobilization pillow and a neck accuform cushion.  I have requested : 3D Simulation  I have requested a DVH of the following structures: targets and all normal structures near the target including liver, kidneys, spinal cord, skin and others as noted on the radiation plan to maintain doses in adherence with established limits  SPECIAL TREATMENT PROCEDURE:  The planned course of therapy using radiation constitutes a special treatment procedure. Special care is required in the management of this patient for the following  reasons. High dose per fraction requiring special monitoring for increased toxicities of treatment including daily imaging..  The special nature of the planned course of radiotherapy will require increased physician supervision and oversight to ensure patient's safety with optimal treatment outcomes.    This requires extended time and effort.    PLAN:  The patient will receive 50 Gy in 5 fractions.  ________________________________  Trilby Fujisawa Lorri Rota, M.D.

## 2024-04-27 ENCOUNTER — Encounter: Payer: Self-pay | Admitting: Nurse Practitioner

## 2024-04-27 ENCOUNTER — Inpatient Hospital Stay (HOSPITAL_BASED_OUTPATIENT_CLINIC_OR_DEPARTMENT_OTHER): Admitting: Nurse Practitioner

## 2024-04-27 ENCOUNTER — Inpatient Hospital Stay

## 2024-04-27 VITALS — BP 136/60 | HR 71 | Temp 98.1°F | Resp 17 | Ht 67.0 in | Wt 133.4 lb

## 2024-04-27 DIAGNOSIS — C220 Liver cell carcinoma: Secondary | ICD-10-CM | POA: Diagnosis not present

## 2024-04-27 DIAGNOSIS — Z510 Encounter for antineoplastic radiation therapy: Secondary | ICD-10-CM | POA: Diagnosis not present

## 2024-04-27 LAB — CMP (CANCER CENTER ONLY)
ALT: 94 U/L — ABNORMAL HIGH (ref 0–44)
AST: 85 U/L — ABNORMAL HIGH (ref 15–41)
Albumin: 3.4 g/dL — ABNORMAL LOW (ref 3.5–5.0)
Alkaline Phosphatase: 769 U/L — ABNORMAL HIGH (ref 38–126)
Anion gap: 6 (ref 5–15)
BUN: 19 mg/dL (ref 8–23)
CO2: 27 mmol/L (ref 22–32)
Calcium: 9 mg/dL (ref 8.9–10.3)
Chloride: 114 mmol/L — ABNORMAL HIGH (ref 98–111)
Creatinine: 1.14 mg/dL (ref 0.61–1.24)
GFR, Estimated: >60 mL/min (ref >60)
Glucose, Bld: 95 mg/dL (ref 70–99)
Potassium: 4.4 mmol/L (ref 3.5–5.1)
Sodium: 147 mmol/L — ABNORMAL HIGH (ref 135–145)
Total Bilirubin: 1.2 mg/dL (ref 0.0–1.2)
Total Protein: 6.6 g/dL (ref 6.5–8.1)

## 2024-04-27 LAB — CBC WITH DIFFERENTIAL (CANCER CENTER ONLY)
Abs Immature Granulocytes: 0.04 10*3/uL (ref 0.00–0.07)
Basophils Absolute: 0 10*3/uL (ref 0.0–0.1)
Basophils Relative: 0 %
Eosinophils Absolute: 0 10*3/uL (ref 0.0–0.5)
Eosinophils Relative: 0 %
HCT: 30.7 % — ABNORMAL LOW (ref 39.0–52.0)
Hemoglobin: 9.6 g/dL — ABNORMAL LOW (ref 13.0–17.0)
Immature Granulocytes: 1 %
Lymphocytes Relative: 16 %
Lymphs Abs: 1 10*3/uL (ref 0.7–4.0)
MCH: 30.4 pg (ref 26.0–34.0)
MCHC: 31.3 g/dL (ref 30.0–36.0)
MCV: 97.2 fL (ref 80.0–100.0)
Monocytes Absolute: 0.6 10*3/uL (ref 0.1–1.0)
Monocytes Relative: 10 %
Neutro Abs: 4.7 10*3/uL (ref 1.7–7.7)
Neutrophils Relative %: 73 %
Platelet Count: 242 10*3/uL (ref 150–400)
RBC: 3.16 MIL/uL — ABNORMAL LOW (ref 4.22–5.81)
RDW: 19.9 % — ABNORMAL HIGH (ref 11.5–15.5)
WBC Count: 6.4 10*3/uL (ref 4.0–10.5)
nRBC: 0 % (ref 0.0–0.2)

## 2024-05-04 ENCOUNTER — Ambulatory Visit

## 2024-05-05 ENCOUNTER — Ambulatory Visit

## 2024-05-05 ENCOUNTER — Ambulatory Visit: Admitting: Hematology

## 2024-05-05 ENCOUNTER — Other Ambulatory Visit: Payer: Self-pay

## 2024-05-05 ENCOUNTER — Ambulatory Visit
Admission: RE | Admit: 2024-05-05 | Discharge: 2024-05-05 | Disposition: A | Discharge status: Home / Self Care | Source: Ambulatory Visit | Attending: Radiation Oncology

## 2024-05-05 ENCOUNTER — Other Ambulatory Visit

## 2024-05-05 ENCOUNTER — Inpatient Hospital Stay (HOSPITAL_BASED_OUTPATIENT_CLINIC_OR_DEPARTMENT_OTHER): Admitting: Nurse Practitioner

## 2024-05-05 ENCOUNTER — Inpatient Hospital Stay

## 2024-05-05 ENCOUNTER — Encounter: Payer: Self-pay | Admitting: Nurse Practitioner

## 2024-05-05 VITALS — BP 144/60 | HR 62 | Temp 97.5°F | Resp 18 | Wt 143.5 lb

## 2024-05-05 DIAGNOSIS — Z515 Encounter for palliative care: Secondary | ICD-10-CM | POA: Diagnosis not present

## 2024-05-05 DIAGNOSIS — C220 Liver cell carcinoma: Secondary | ICD-10-CM | POA: Diagnosis not present

## 2024-05-05 DIAGNOSIS — R530 Neoplastic (malignant) related fatigue: Secondary | ICD-10-CM | POA: Diagnosis not present

## 2024-05-05 DIAGNOSIS — C7951 Secondary malignant neoplasm of bone: Secondary | ICD-10-CM

## 2024-05-05 DIAGNOSIS — Z510 Encounter for antineoplastic radiation therapy: Secondary | ICD-10-CM | POA: Diagnosis not present

## 2024-05-05 LAB — CBC WITH DIFFERENTIAL (CANCER CENTER ONLY)
Abs Immature Granulocytes: 0.02 10*3/uL (ref 0.00–0.07)
Basophils Absolute: 0 10*3/uL (ref 0.0–0.1)
Basophils Relative: 0 %
Eosinophils Absolute: 0 10*3/uL (ref 0.0–0.5)
Eosinophils Relative: 0 %
HCT: 30.1 % — ABNORMAL LOW (ref 39.0–52.0)
Hemoglobin: 9.6 g/dL — ABNORMAL LOW (ref 13.0–17.0)
Immature Granulocytes: 0 %
Lymphocytes Relative: 4 %
Lymphs Abs: 0.2 10*3/uL — ABNORMAL LOW (ref 0.7–4.0)
MCH: 31.2 pg (ref 26.0–34.0)
MCHC: 31.9 g/dL (ref 30.0–36.0)
MCV: 97.7 fL (ref 80.0–100.0)
Monocytes Absolute: 0.1 10*3/uL (ref 0.1–1.0)
Monocytes Relative: 1 %
Neutro Abs: 6.4 10*3/uL (ref 1.7–7.7)
Neutrophils Relative %: 95 %
Platelet Count: 147 10*3/uL — ABNORMAL LOW (ref 150–400)
RBC: 3.08 MIL/uL — ABNORMAL LOW (ref 4.22–5.81)
RDW: 20.4 % — ABNORMAL HIGH (ref 11.5–15.5)
WBC Count: 6.8 10*3/uL (ref 4.0–10.5)
nRBC: 0 % (ref 0.0–0.2)

## 2024-05-05 LAB — CMP (CANCER CENTER ONLY)
ALT: 80 U/L — ABNORMAL HIGH (ref 0–44)
AST: 58 U/L — ABNORMAL HIGH (ref 15–41)
Albumin: 3.6 g/dL (ref 3.5–5.0)
Alkaline Phosphatase: 558 U/L — ABNORMAL HIGH (ref 38–126)
Anion gap: 7 (ref 5–15)
BUN: 16 mg/dL (ref 8–23)
CO2: 27 mmol/L (ref 22–32)
Calcium: 8.9 mg/dL (ref 8.9–10.3)
Chloride: 110 mmol/L (ref 98–111)
Creatinine: 1.11 mg/dL (ref 0.61–1.24)
GFR, Estimated: >60 mL/min (ref >60)
Glucose, Bld: 126 mg/dL — ABNORMAL HIGH (ref 70–99)
Potassium: 4 mmol/L (ref 3.5–5.1)
Sodium: 144 mmol/L (ref 135–145)
Total Bilirubin: 1.2 mg/dL (ref 0.0–1.2)
Total Protein: 6.6 g/dL (ref 6.5–8.1)

## 2024-05-05 LAB — RAD ONC ARIA SESSION SUMMARY
Course Elapsed Days: 0
Plan Fractions Treated to Date: 1
Plan Prescribed Dose Per Fraction: 10 Gy
Plan Total Fractions Prescribed: 5
Plan Total Prescribed Dose: 50 Gy
Reference Point Dosage Given to Date: 10 Gy
Reference Point Session Dosage Given: 10 Gy
Session Number: 1

## 2024-05-05 MED ORDER — FUROSEMIDE 20 MG PO TABS: 10.0000 mg | ORAL_TABLET | Freq: Every day | ORAL | 0 refills | Status: DC | PRN

## 2024-05-05 NOTE — Progress Notes (Signed)
 Palliative Medicine [facility]  [phone] [phone]) [phone]   Name: Corey Armstrong. Date: 05/05/2024 MRN: [id number]  DOB: [birth year]  Patient Care Team: [name], MD as PCP - General (Internal Medicine) [name], MD as PCP - Cardiology (Cardiology) [name], MD as Attending Physician (Hematology and Oncology) [name], [name], NP as Nurse Practitioner (Hospice and Palliative Medicine) Imaging, [facility] as Radiologist (Diagnostic Radiology)    INTERVAL HISTORY: Tevion Laforge Armstrong. is a 68 y.o. 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 Palliative ask to see for symptom and pain management and goals of care.   SOCIAL HISTORY:     reports that he has quit smoking. He has been exposed to tobacco smoke. He has never used smokeless tobacco. He reports that he does not currently use alcohol. He reports that he does not use drugs.  ADVANCE DIRECTIVES:  None on file   CODE STATUS: Full code  PAST MEDICAL HISTORY: Past Medical History:  Diagnosis Date   Headache    migraines   Hepatitis    HX OF hEP c ?    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    ALLERGIES:  has no known allergies.  MEDICATIONS:  Current Outpatient Medications  Medication Sig Dispense Refill   furosemide (LASIX) 20 MG tablet Take 0.5 tablets (10 mg total) by mouth daily as needed for edema. 20 tablet 0   acetaminophen  (TYLENOL ) 500 MG tablet Take 1,000 mg by mouth every 6 (six) hours as needed.     aspirin  EC 81 MG tablet Take 1 tablet (81 mg total) by mouth daily. Swallow whole. 30 tablet 12   atorvastatin  (LIPITOR) 40 MG tablet Take 1 tablet (40 mg total) by mouth daily. 30 tablet 11   hydrALAZINE  (APRESOLINE ) 10 MG tablet TAKE 1 TABLET 2 (TWO) TIMES DAILY AS NEEDED. TAKE FOR SYSTOLIC BLOOD  PRESSURE MORE THAN 170. 180 tablet 3   olmesartan  (BENICAR ) 20 MG tablet Take 1 tablet (20 mg total) by mouth daily. 30 tablet 11   pantoprazole  (PROTONIX ) 20 MG tablet Take 1 tablet (20 mg total) by mouth daily. Take when you are prednisone  30 tablet 1   predniSONE  (DELTASONE ) 20 MG tablet Take 3 tablets (60 mg total) by mouth daily with breakfast. Dose taper per provider after 2 weeks of treatment 90 tablet 0   No current facility-administered medications for this visit.    VITAL SIGNS: BP (!) 144/60   Pulse 62   Temp (!) 97.5 F (36.4 C)   Resp 18   Wt 143 lb 8 oz (65.1 kg)   SpO2 99%   BMI 22.48 kg/m  Filed Weights   05/05/24 1256  Weight: 143 lb 8 oz (65.1 kg)    Estimated body mass index is 22.48 kg/m as calculated from the following:   Height as of 04/27/24: 5' 7 (1.702 m).   Weight as of this encounter: 143 lb 8 oz (65.1 kg).   PERFORMANCE STATUS (ECOG) : 1 - Symptomatic but completely ambulatory   Physical Exam General: NAD Cardiovascular: regular rate and rhythm Pulmonary: normal breathing pattern Extremities: bilateral lower extremity edema R>L, no joint deformities Skin: no rashes Neurological: AAO x3  IMPRESSION: Discussed the use of AI scribe software for clinical note transcription with the patient, who gave verbal consent to proceed.  History of Present  Illness Corey Armstrong. is a 68 year old male who presents to clinic for follow-up. No acute distress noted. Reports he is doing well overall. Denies concerns with nausea or vomiting.  Occasional fatigue however he continues to remain as active as possible.  He has been experiencing bilateral leg swelling for a couple of weeks, with the right leg more affected than the left. The swelling extends up to the knees and has worsened since Saturday. It does not improve with leg elevation. He experiences tingling in the legs, particularly when sleeping, but denies significant pain. He is not taking any  medication for the tingling, such as gabapentin . Declines to restart gabapentin  at this time. Denies pain or redness to legs. Right leg brace in place. Education provided on as needed use of lasix.   He has a history of foot drop, for which he wears a brace. He underwent back surgery in the past, which improved his mobility, allowing him to walk without a wheelchair, although he still experiences some difficulty if he forgets to lift his foot.    Will continue to support and follow as needed. All questions answered and support provided.   I discussed the importance of continued conversation with family and their medical providers regarding overall plan of care and treatment options, ensuring decisions are within the context of the patients values and GOCs. Assessment & Plan Fatigue Low energy levels possibly related to sleep disturbance and recent surgery.  Peripheral edema Bilateral edema, worse on right, unresponsive to elevation. Tingling from swelling. No diuretics used. - Prescribed low-dose Lasix PRN for fluid retention, morning use advised to prevent nocturia. - Advised adequate hydration to prevent dehydration. - Instructed home blood pressure monitoring to avoid hypotension. - Directed Lasix use only as needed, follow-up if daily use required. - [facility] [address].  Foot drop Foot drop managed with brace. Gabapentin  stopped due to potential exacerbation.   I will plan to see patient back in 4-6 weeks. Sooner if needed.   Patient expressed understanding and was in agreement with this plan. He also understands that He can call the clinic at any time with any questions, concerns, or complaints.   Any controlled substances utilized were prescribed in the context of palliative care. PDMP has been reviewed.   Visit consisted of counseling and education dealing with the complex and emotionally intense issues of symptom management and palliative care in the  setting of serious and potentially life-threatening illness.  Levon Borer, AGPCNP-BC  Palliative Medicine Team/[facility]

## 2024-05-06 ENCOUNTER — Inpatient Hospital Stay: Admitting: Hematology

## 2024-05-06 ENCOUNTER — Inpatient Hospital Stay

## 2024-05-06 ENCOUNTER — Ambulatory Visit

## 2024-05-07 ENCOUNTER — Other Ambulatory Visit (HOSPITAL_COMMUNITY): Payer: Self-pay

## 2024-05-07 ENCOUNTER — Other Ambulatory Visit: Payer: Self-pay

## 2024-05-07 ENCOUNTER — Telehealth: Payer: Self-pay | Admitting: Pharmacist

## 2024-05-07 ENCOUNTER — Ambulatory Visit

## 2024-05-07 ENCOUNTER — Inpatient Hospital Stay (HOSPITAL_BASED_OUTPATIENT_CLINIC_OR_DEPARTMENT_OTHER): Admitting: Hematology

## 2024-05-07 ENCOUNTER — Encounter: Payer: Self-pay | Admitting: Hematology

## 2024-05-07 ENCOUNTER — Inpatient Hospital Stay

## 2024-05-07 ENCOUNTER — Telehealth: Payer: Self-pay

## 2024-05-07 ENCOUNTER — Ambulatory Visit
Admission: RE | Admit: 2024-05-07 | Discharge: 2024-05-07 | Disposition: A | Discharge status: Home / Self Care | Source: Ambulatory Visit | Attending: Radiation Oncology | Admitting: Radiation Oncology

## 2024-05-07 VITALS — BP 140/50 | HR 66 | Temp 98.0°F | Resp 17 | Ht 67.0 in | Wt 145.2 lb

## 2024-05-07 DIAGNOSIS — C220 Liver cell carcinoma: Secondary | ICD-10-CM

## 2024-05-07 DIAGNOSIS — Z510 Encounter for antineoplastic radiation therapy: Secondary | ICD-10-CM | POA: Diagnosis not present

## 2024-05-07 LAB — RAD ONC ARIA SESSION SUMMARY
Course Elapsed Days: 2
Plan Fractions Treated to Date: 2
Plan Prescribed Dose Per Fraction: 10 Gy
Plan Total Fractions Prescribed: 5
Plan Total Prescribed Dose: 50 Gy
Reference Point Dosage Given to Date: 20 Gy
Reference Point Session Dosage Given: 10 Gy
Session Number: 2

## 2024-05-07 MED ORDER — CABOMETYX 20 MG PO TABS: 20.0000 mg | ORAL_TABLET | Freq: Every day | ORAL | 0 refills | Status: DC

## 2024-05-07 MED ORDER — PREDNISONE 5 MG PO TABS
ORAL_TABLET | ORAL | 0 refills | Status: DC
  Filled 2024-05-07: qty 10, 14d supply, fill #0

## 2024-05-07 NOTE — Telephone Encounter (Signed)
 Oral Oncology Patient Advocate Encounter   Received notification that prior authorization for Cabometyx is required.   PA submitted on 05/07/24 Key [id number] Status is pending      Charlott Hamilton,  CPhT-Adv  she/her/hers [facility]  [facility] Specialty Pharm[facility] Services Pharmacy Technician Patient Advocate Specialist III [facility] [email or web address]  Fax: [phone]

## 2024-05-07 NOTE — Assessment & Plan Note (Signed)
 diagnosed 11/2019 by MRI for f/u of cirrhosis. S/p microwave ablation on 03/01/20. -MRI 06/01/22 showed suspicion for local recurrence and metastasis at L5. Biopsy of L5 confirmed metastatic HCC. S/p repeat microwave ablation on 07/17/22. -started lenvima  08/16/22 -staging CT CAP and bone scan on 09/19/22 showed: interval enlargement of right hepatic lobe HCC; new retropulsed bony fragment at L5; no other new lesions. His previous CT was in 05/2022 and he did not start Lenvima  until 07/2022, so it's hard to evaluate if he is responding to Lenvima  or not, the progression is likely the nature course of his [facility]  -Restaging CT scan from January 02, 2023 showed slightly improved previously treated liver lesion, and additional area of concern in the liver which are difficult to compare with previous scan due to different timing of contrast.  No other evidence of disease progression.  Plan to obtain MRI as next scan.  I personally reviewed the CT scan images and discussed the findings with patient -Will continue Lenvima , he is tolerating well overall. -Due to his weight being above 60 kg now, I increased his Lenvima  to 12 mg daily in Feb 2024 -repeated liver MRI on 04/23/2023 showed stable treated liver lesion, L5 bone mets has slightly increased in size.  Patient developed worsening low back pain in July 2024, repeated lumbar MRI showed significant disease progression in the L5 bone lesion.  I personally reviewed the scan images and discussed the findings with patient. -I recommend palliative radiation to his L5 lesion for pain control -I recommend changing systemic treatment to immunotherapy, I discussed option of atezolizumab and bevacizumab, durvalumab  and Imjudo , nivo and ipi etc. since he previously progressed on oral TKI, I recommend him to proceed with durvalumab  and Imjudo . He started on 06/30/23 -the goal of therapy is curative/palliative to prolong life and prevent/improve cancer related symptoms. -he underwent  Bilateral laminotomies L5-S1 with decompression of the spinal canal and the L5 nerve roots epidural tumor by Dr. Colon On 11/20/2023 -he received SBRT to low back in Feb 2025 -CT and abdominal MRI showed new enhancing lesion posteriorly in segment 7, suspicious for recurrence/metastasis, otherwise stable treated liver lesions and bone mets.  I discussed option of ablation versus SBRT radiation.  Patient prefers SBRT. -due to worsening LFTs, I stopped Durvalumab  in May 2025  -I referred him back to Dr. Patrcia and plan to change systemic therapy to carbozentinib when his LFT improves  -He started SBRT to new oligo liver met on 05/05/2024

## 2024-05-07 NOTE — Telephone Encounter (Signed)
 Oral Oncology Patient Advocate Encounter  Prior Authorization for Cabometyx has been approved.    KEY: [id number] No PA# was provided. Effective dates: 05/07/24 through 05/07/25  Patient must fill at [facility]. All supporting documentation will be sent when appropriate.     Charlott Hamilton,  CPhT-Adv  she/her/hers [employer] [facility]  [facility] Specialty Pharmacy Services Pharmacy Technician Patient Advocate Specialist III [facility] [email or web address]  Fax: [phone]

## 2024-05-07 NOTE — Progress Notes (Signed)
 [name]   [phone]) [phone] [phone]   Clinic Follow up Note   Patient Care Team: [name], MD as PCP - General (Internal Medicine) [name], MD as PCP - Cardiology (Cardiology) [name], MD as Attending Physician (Hematology and Oncology) [name], [name], NP as Nurse Practitioner (Hospice and Palliative Medicine) Imaging, [facility] as Radiologist (Diagnostic Radiology)  Date of Service:  05/07/2024  CHIEF COMPLAINT: f/u of hepatocellular carcinoma  CURRENT THERAPY:  Pending cabozantinib  Oncology History   Hepatocellular carcinoma in adult [facility]) diagnosed 11/2019 by MRI for f/u of cirrhosis. S/p microwave ablation on 03/01/20. -MRI 06/01/22 showed suspicion for local recurrence and metastasis at L5. Biopsy of L5 confirmed metastatic HCC. S/p repeat microwave ablation on 07/17/22. -started lenvima  08/16/22 -staging CT CAP and bone scan on 09/19/22 showed: interval enlargement of right hepatic lobe HCC; new retropulsed bony fragment at L5; no other new lesions. His previous CT was in 05/2022 and he did not start Lenvima  until 07/2022, so it's hard to evaluate if he is responding to Lenvima  or not, the progression is likely the nature course of his [facility]  -Restaging CT scan from January 02, 2023 showed slightly improved previously treated liver lesion, and additional area of concern in the liver which are difficult to compare with previous scan due to different timing of contrast.  No other evidence of disease progression.  Plan to obtain MRI as next scan.  I personally reviewed the CT scan images and discussed the findings with patient -Will continue Lenvima , he is tolerating well overall. -Due to his weight being above 60 kg now, I increased his Lenvima  to 12 mg daily in Feb 2024 -repeated liver MRI on 04/23/2023 showed stable treated liver lesion, L5 bone mets has slightly increased in size.  Patient developed  worsening low back pain in July 2024, repeated lumbar MRI showed significant disease progression in the L5 bone lesion.  I personally reviewed the scan images and discussed the findings with patient. -I recommend palliative radiation to his L5 lesion for pain control -I recommend changing systemic treatment to immunotherapy, I discussed option of atezolizumab and bevacizumab, durvalumab  and Imjudo , nivo and ipi etc. since he previously progressed on oral TKI, I recommend him to proceed with durvalumab  and Imjudo . He started on 06/30/23 -the goal of therapy is curative/palliative to prolong life and prevent/improve cancer related symptoms. -he underwent Bilateral laminotomies L5-S1 with decompression of the spinal canal and the L5 nerve roots epidural tumor by Dr. Colon On 11/20/2023 -he received SBRT to low back in Feb 2025 -CT and abdominal MRI showed new enhancing lesion posteriorly in segment 7, suspicious for recurrence/metastasis, otherwise stable treated liver lesions and bone mets.  I discussed option of ablation versus SBRT radiation.  Patient prefers SBRT. -due to worsening LFTs, I stopped Durvalumab  in May 2025  -I referred him back to Dr. Patrcia and plan to change systemic therapy to carbozentinib when his LFT improves  -He started SBRT to new oligo liver met on 05/05/2024   Assessment & Plan Hepatocellular carcinoma Hepatocellular carcinoma under treatment with radiation therapy. Liver function has improved with bilirubin levels normalized and liver enzymes reduced from 400 to 58, possibly due to resolution of an insult from immunotherapy. Radiation therapy is ongoing with three to four sessions remaining, expected to conclude by May 17, 2024. No significant fatigue or back pain, but occasional hip pain reported. - Continue radiation therapy, expected to complete by May 17, 2024. - Initiate cabozantinib on May 24, 2024, starting at a low dose to assess tolerance. - Verify insurance  coverage for cabozantinib and seek financial assistance if necessary. - Schedule follow-up appointment for the week of May 31, 2024, to assess tolerance to cabozantinib.  Steroid management  - He is on oral prednisone  for immunotherapy induced hepatitis -Current prednisone  regimen is being tapered from an initial dose of three tablets daily to two tablets daily to minimize side effects, including weight gain and swelling. - Taper prednisone  dose: June 28 to May 14, 2024, take 20 mg daily; July 5 to May 21, 2024, take 10 mg daily; July 12 to May 28, 2024, take 5 mg daily; July 19 to June 04, 2024, take 2.5 mg daily then stop  - Call in prescription for smaller dose 5mg  tablets to facilitate tapering.   Plan -Lab reviewed, LFTs much improved, will taper off prednisone  in the next 4 weeks - I called in cabozantinib 20 mg daily, he will increase to 40 mg daily on second week and 60 mg daily on third week if he tolerates well.  Plan to start on July 14  -follow-up in 4 weeks - I wrote a letter for him to return to work on July 25     SUMMARY OF ONCOLOGIC HISTORY: Oncology History  Hepatocellular carcinoma in adult [facility])  12/08/2019 Imaging   MR ABDOMEN WWO CONTRAST   IMPRESSION: Two small masses in the right hepatic lobe which have characteristics diagnostic for hepatocellular carcinoma in the setting of cirrhosis. (LI-RADS Category 5: Definitely HCC)   No evidence of abdominal metastatic disease or other significant abnormality.   03/01/2020 Initial Diagnosis   Hepatocellular carcinoma (HCC)   03/01/2020 Procedure   CT GUIDE TISSUE ABLATION     IMPRESSION: Successful percutaneous thermal ablation.   Signed,   [name], MD, RPVI   06/02/2020 Imaging   MR ABDOMEN WWO CONTRAST   IMPRESSION: 1. There has been interval ablation at two adjacent sites of the subcapsular right lobe of the liver, hepatic segment VI. There is no residual contrast enhancement at these  adjacent sites. LI-RADS category 5 TR, nonviable. 2. There are scattered, subcentimeter foci of arterial hyperenhancement of the anterior right lobe of the liver, for example adjacent to the ablation site measuring 3 mm and in the medial anterior right lobe of the liver measuring 6 mm. This larger focus is unchanged compared to prior. Others were not previously seen. These findings are nonspecific and consistent with LI-RADS category 3. Attention on follow-up.   09/02/2020 Imaging   MR ABDOMEN WWO CONTRAST     IMPRESSION: 1. Post radiofrequency ablation along the margin of the RIGHT hemi liver. Heterogeneous appearance of this area on both T1 and T2. No sign of residual enhancement. LR TR nonviable. 2. Very subtle focus of restricted diffusion in the cephalad lateral segment LEFT hepatic lobe measuring 7 mm, corresponding to low signal on image 27 of series 21. Arterial enhancement is not seen in this area and this areas not seen on prior imaging. Not clear whether this represents a new lesion as this area is obscured largely by artifact on most sequences on today's study and on previous imaging evaluations. LR category 3, consider a 3 to six-month follow-up with abdominal MRI. 3. Lesions of concern scattered about the RIGHT hepatic lobe may represent perfusional anomalies as they were not seen on today's study. 4. Stable mild dilation of the biliary tree.   11/27/2020 Imaging  MR ABDOMEN WWO CONTRAST   IMPRESSION: Minimal decrease in size of ablation defect in the peripheral anterior right hepatic lobe. No evidence of locally recurrent hepatocellular carcinoma or other suspicious liver lesions.   No evidence of metastatic disease or other acute findings.   04/03/2021 Imaging   MR ABDOMEN WWO CONTRAST   IMPRESSION: Stable ablation defect in the lateral right hepatic lobe, without evidence of recurrent tumor at this site.   5 mm hypovascular lesion in segment 5 of the  right lobe was not definitely seen on previous study, but is too small to characterize. This shows no arterial phase hyperenhancement. Recommend continued follow-up by MRI in 6 months.   No evidence of abdominal metastatic disease.        09/03/2021 Imaging   MR ABDOMEN WWO CONTRAST   IMPRESSION: 1. Side-by-side ablation sites in the right hepatic lobe in segment 6. The more anterior site remains largely cystic in appearance and no findings suspicious recurrent tumor in this area. There is however abnormal contrast enhancement around the larger more posterior and lateral ablation site worrisome for recurrent tumor. 2. Several small foci of early arterial phase enhancement in the liver. The peripheral lesions are likely vascular shunts. The more central lesions are likely dysplastic nodules. Recommend continued surveillance. 3. Stable bilateral renal cysts.   12/07/2021 Imaging   MR ABDOMEN WWO CONTRAST  IMPRESSION: 1. Ablation defects in the right lobe of the liver, similar to the prior study, without definitive evidence to suggest local recurrence of disease or metastatic disease. 2. Multiple tiny arterial phase areas of nodular hyperenhancement scattered throughout the liver, predominantly in the right lobe of the liver, without perceptible signal abnormality or diffusion restriction on other pulse sequences, nonspecific, and likely small benign areas of vascular shunting. Continued attention on follow-up imaging is recommended. 3. Multiple Bosniak class 1 and Bosniak class 2 cysts in the kidneys bilaterally, similar to the prior study, as above. 4. Aortic atherosclerosis.     06/01/2022 Imaging   MR ABDOMEN WWO CONTRAST   IMPRESSION: 1. LR TR equivocal but suspicious findings with respect to previously treated disease in the RIGHT hepatic lobe given nodular enhancement adjacent and anterior superior to the ablation zone as described. 2. Given lack of late arterial  phase due to bolus timing a second area of abnormality within the liver is suspicious for additional site of hepatocellular carcinoma showing washout appearance and capsule appearance on current imaging, cardiac pulsation limiting assessment in the area of concern. 3. Suspect bony metastatic disease in the lower lumbar spine at L5 level. Dedicated spinal imaging may be helpful for further evaluation.     06/18/2022 Imaging   MR Lumbar Spine W Wo Contrast   IMPRESSION: L5 vertebral body lesion is most consistent with a metastasis. There is probable trace ventral epidural extension   07/17/2022 Procedure   CT GUIDE TISSUE ABLATION   IMPRESSION: Successful CT-guided microwave ablation of recurrent hepatocellular carcinoma.   07/17/2022 Procedure   CT BONE TROCAR/NEEDLE BIOPSY SUPERFICIAL   IMPRESSION: Successful CT-guided core biopsy of L5 bone lesion.      07/17/2022 Pathology Results   SURGICAL PATHOLOGY  CASE: [id number]  PATIENT: KUSHAL SAUNDERS  Surgical Pathology Report   SURGICAL PATHOLOGY  CASE: [id number]  PATIENT: ELLAREE SIERRA  Surgical Pathology Report      Clinical History: Hx of cirrhosis and small HCC with unexpected  suspicious lesion at L5. Rare HCC met to bone vs atypical hemangioma or  met from new primary. (crm)  FINAL MICROSCOPIC DIAGNOSIS:   A. 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 No acute findings within the chest. No suspicious findings for metastatic disease within the chest. 2. Unchanged 3 mm nodule within the medial right upper lobe. No new or suspicious lung nodules. 3. Suboptimal visualization of the known liver lesions, reflecting lack of IV contrast material. Similar appearance of ablation defect within the lateral right lobe of liver. 4.  Left renal calculi. 5. Coronary artery calcifications. 6.  Aortic Atherosclerosis   06/25/2023 Imaging   NM Bone Scan whole body  IMPRESSION: 1. Known L5 metastasis demonstrates low level uptake, mildly increased from previous bone scan. 2. No new lesions demonstrated.   06/30/2023 - 03/10/2024 Chemotherapy   Patient is on Treatment Plan : Hepatocellular Carcinoma Tremelimumab -actl C1 D1 + Durvalumab  q28d       Imaging        Discussed the use of AI scribe software for clinical note transcription with the patient, who gave verbal consent to proceed.  History of Present Illness Corey Pieczynski Sr. is a 68 year old male with hepatocellular carcinoma who presents for follow-up.  He is undergoing radiation therapy for hepatocellular carcinoma with three to four sessions remaining, concluding on July 7. He has not experienced significant side effects such as nausea or gastrointestinal issues.  Mild swelling in his feet and ankles is present, attributed to steroid use. He is tapering his prednisone  dose from three to two tablets daily, with plans for further reduction. He has been on prednisone  for three weeks.  Occasional hip pain occurs but resolves spontaneously. There is no significant fatigue, nausea, or back pain. He has noticed weight gain, likely related to steroid use.  Bilirubin levels have normalized, and liver enzymes have decreased from 400 to 58.     All other systems were reviewed with the patient and are negative.  MEDICAL HISTORY:  Past Medical History:  Diagnosis Date   Headache    migraines   Hepatitis    HX OF hEP c ?    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    SURGICAL HISTORY: Past Surgical History:  Procedure Laterality Date   BREAST BIOPSY Left 03/26/2024   US  LT BREAST BX W LOC DEV 1ST LESION IMG BX SPEC US  GUIDE 03/26/2024 [facility] MAMMOGRAPHY   DECOMPRESSIVE LUMBAR LAMINECTOMY LEVEL 1 N/A 11/20/2023    Procedure: lumbar laminectomy right Lumbar five with decompression of nerve roots and excision of tumor;  Surgeon: Colon Shove, MD;  Location: [facility] OR;  Service: Neurosurgery;  Laterality: N/A;   IR RADIOLOGIST EVAL & MGMT  01/25/2020   IR RADIOLOGIST EVAL & MGMT  03/21/2020   IR RADIOLOGIST EVAL & MGMT  06/07/2020   IR RADIOLOGIST EVAL & MGMT  09/05/2020   IR RADIOLOGIST EVAL & MGMT  12/05/2020   IR RADIOLOGIST EVAL & MGMT  05/09/2021   IR RADIOLOGIST EVAL & MGMT  09/06/2021   IR RADIOLOGIST EVAL & MGMT  12/12/2021   IR RADIOLOGIST EVAL & MGMT  06/25/2022   IR RADIOLOGIST EVAL & MGMT  07/24/2022   RADIOLOGY WITH ANESTHESIA N/A 03/01/2020   Procedure: CT WITH ANESTHESIA  THERMAL MICROWAVE  ABLATIION;  Surgeon: Karalee Beat, MD;  Location: [facility] ORS;  Service: Anesthesiology;  Laterality: N/A;  RADIOLOGY WITH ANESTHESIA N/A 07/17/2022   Procedure: CT MICROWAVE ABLATION;  Surgeon: Karalee Wilkie POUR, MD;  Location: [facility] ORS;  Service: Radiology;  Laterality: N/A;    I have reviewed the social history and family history with the patient and they are unchanged from previous note.  ALLERGIES:  has no known allergies.  MEDICATIONS:  Current Outpatient Medications  Medication Sig Dispense Refill   acetaminophen  (TYLENOL ) 500 MG tablet Take 1,000 mg by mouth every 6 (six) hours as needed.     aspirin  EC 81 MG tablet Take 1 tablet (81 mg total) by mouth daily. Swallow whole. 30 tablet 12   atorvastatin  (LIPITOR) 40 MG tablet Take 1 tablet (40 mg total) by mouth daily. 30 tablet 11   cabozantinib (CABOMETYX) 20 MG tablet Take 1 tablet (20 mg total) by mouth daily. Take on an empty stomach, 1 hour before or 2 hours after meals. Increase to 2 tabs daily for second week and 3 tabs daily for third week if tolerates well. Start on 05/24/24 60 tablet 0   furosemide  (LASIX ) 20 MG tablet Take 0.5 tablets (10 mg total) by mouth daily as needed for edema. 20 tablet 0   hydrALAZINE  (APRESOLINE ) 10 MG tablet TAKE 1  TABLET 2 (TWO) TIMES DAILY AS NEEDED. TAKE FOR SYSTOLIC BLOOD PRESSURE MORE THAN 170. 180 tablet 3   olmesartan  (BENICAR ) 20 MG tablet Take 1 tablet (20 mg total) by mouth daily. 30 tablet 11   pantoprazole  (PROTONIX ) 20 MG tablet Take 1 tablet (20 mg total) by mouth daily. Take when you are prednisone  30 tablet 1   predniSONE  (DELTASONE ) 20 MG tablet Take 3 tablets (60 mg total) by mouth daily with breakfast. Dose taper per provider after 2 weeks of treatment 90 tablet 0   predniSONE  (DELTASONE ) 5 MG tablet Take 1 tab daily 7/12-7/18, and take half tab daily 7/19-7/25 10 tablet 0   No current facility-administered medications for this visit.    PHYSICAL EXAMINATION: ECOG PERFORMANCE STATUS: 1 - Symptomatic but completely ambulatory  Vitals:   05/07/24 1216  BP: (!) 140/50  Pulse: 66  Resp: 17  Temp: 98 F (36.7 C)  SpO2: 99%   Wt Readings from Last 3 Encounters:  05/07/24 145 lb 3.2 oz (65.9 kg)  05/05/24 143 lb 8 oz (65.1 kg)  04/27/24 133 lb 6.4 oz (60.5 kg)     GENERAL:alert, no distress and comfortable SKIN: skin color, texture, turgor are normal, no rashes or significant lesions EYES: normal, Conjunctiva are pink and non-injected, sclera clear NECK: supple, thyroid  normal size, non-tender, without nodularity LYMPH:  no palpable lymphadenopathy in the cervical, axillary  LUNGS: clear to auscultation and percussion with normal breathing effort HEART: regular rate & rhythm and no murmurs  ABDOMEN:abdomen soft, non-tender and normal bowel sounds Musculoskeletal:no cyanosis of digits and no clubbing  NEURO: alert & oriented x 3 with fluent speech, no focal motor/sensory deficits EXTREMITIES: Ankle swelling present. Physical Exam   LABORATORY DATA:  I have reviewed the data as listed    Latest Ref Rng & Units 05/05/2024    1:32 PM 04/27/2024    7:44 AM 04/22/2024    9:01 AM  CBC  WBC 4.0 - 10.5 K/uL 6.8  6.4  6.9   Hemoglobin 13.0 - 17.0 g/dL 9.6  9.6  9.8   Hematocrit  39.0 - 52.0 % 30.1  30.7  30.0   Platelets 150 - 400 K/uL 147  242  306  Latest Ref Rng & Units 05/05/2024    1:32 PM 04/27/2024    7:44 AM 04/22/2024    9:01 AM  CMP  Glucose 70 - 99 mg/dL 873  95  864   BUN 8 - 23 mg/dL 16  19  14    Creatinine 0.61 - 1.24 mg/dL 8.88  8.85  8.92   Sodium 135 - 145 mmol/L 144  147  142   Potassium 3.5 - 5.1 mmol/L 4.0  4.4  3.7   Chloride 98 - 111 mmol/L 110  114  111   CO2 22 - 32 mmol/L 27  27  26    Calcium  8.9 - 10.3 mg/dL 8.9  9.0  9.1   Total Protein 6.5 - 8.1 g/dL 6.6  6.6  7.0   Total Bilirubin 0.0 - 1.2 mg/dL 1.2  1.2  1.6   Alkaline Phos 38 - 126 U/L 558  769  1,135   AST 15 - 41 U/L 58  85  96   ALT 0 - 44 U/L 80  94  118       RADIOGRAPHIC STUDIES: I have personally reviewed the radiological images as listed and agreed with the findings in the report. No results found.    No orders of the defined types were placed in this encounter.  All questions were answered. The patient knows to call the clinic with any problems, questions or concerns. No barriers to learning was detected. The total time spent in the appointment was 40 minutes, including review of chart and various tests results, discussions about plan of care and coordination of care plan     Onita Mattock, MD 05/07/2024

## 2024-05-07 NOTE — Telephone Encounter (Signed)
 Oral Oncology Pharmacist Encounter  Received new prescription for Cabometyx (cabozantinib) for the treatment of metastatic hepatocellular carcinoma, planned duration until disease progression or unacceptable drug toxicity. Planned start date is 05/24/24.  CBC w/ Diff and CMP from 05/05/24 assessed, noted patient with AST and ALT slightly above ULN and T. Bili 1.2 mg/dL (improved from 3.2 mg/dL on 01/12/73). No baseline dose adjustments required at this time. Patient BP in clinic on 05/07/24 is 140/50 mmHg - recommend monitoring for HTN after starting Cabometyx. Noted MD would like to try and titrate patient up to goal dose of 60 mg/d if tolerated. Will discuss with MD as insurance will not approve dose titration. Prescription dose and frequency assessed for appropriateness.  Current medication list in Epic reviewed, DDIs with Cabometyx identified: Category C drug-drug interaction between Cabometyx and Furosemide  - furosemide  may increase risk of side effects from Cabometyx. Noted patient only takes furosemide  PRN - monitor patient closely for increased side effects of Cabometyx.  Evaluated chart and no patient barriers to medication adherence noted.   Prescription has been e-scribed to the [facility] for benefits analysis and approval.  Oral Oncology Clinic will continue to follow for insurance authorization, copayment issues, initial counseling and start date.  Corey Armstrong, PharmD, BCPS, BCOP Hematology/Oncology Clinical Pharmacist [facility] Oral Chemotherapy Navigation Clinics [phone] 05/07/2024 2:13 PM

## 2024-05-09 ENCOUNTER — Other Ambulatory Visit: Payer: Self-pay | Admitting: Hematology

## 2024-05-10 ENCOUNTER — Ambulatory Visit (INDEPENDENT_AMBULATORY_CARE_PROVIDER_SITE_OTHER): Payer: Commercial Managed Care - PPO | Admitting: Internal Medicine

## 2024-05-10 ENCOUNTER — Encounter: Payer: Self-pay | Admitting: Internal Medicine

## 2024-05-10 ENCOUNTER — Ambulatory Visit

## 2024-05-10 ENCOUNTER — Other Ambulatory Visit (HOSPITAL_COMMUNITY): Payer: Self-pay

## 2024-05-10 ENCOUNTER — Telehealth: Payer: Self-pay | Admitting: Hematology

## 2024-05-10 VITALS — BP 148/63 | HR 63 | Temp 98.4°F | Ht 68.5 in | Wt 146.0 lb

## 2024-05-10 DIAGNOSIS — I350 Nonrheumatic aortic (valve) stenosis: Secondary | ICD-10-CM

## 2024-05-10 DIAGNOSIS — I10 Essential (primary) hypertension: Secondary | ICD-10-CM

## 2024-05-10 DIAGNOSIS — C220 Liver cell carcinoma: Secondary | ICD-10-CM

## 2024-05-10 DIAGNOSIS — Z0000 Encounter for general adult medical examination without abnormal findings: Secondary | ICD-10-CM

## 2024-05-10 MED ORDER — CABOMETYX 20 MG PO TABS: 20.0000 mg | ORAL_TABLET | Freq: Every day | ORAL | 0 refills | Status: DC

## 2024-05-10 NOTE — Progress Notes (Signed)
 Subjective:    Patient ID: Corey Tarri Sierra Sr., male    DOB: [birth year], 68 y.o.   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     aspirin  EC 81 MG tablet Take 1 tablet (81 mg total) by mouth daily. Swallow whole. 30 tablet 12   atorvastatin  (LIPITOR) 40 MG tablet Take 1 tablet (40 mg total) by mouth daily. 30 tablet 11   cabozantinib (CABOMETYX) 20 MG tablet Take 1 tablet (20 mg total) by mouth daily. Take on an empty stomach, 1 hour before or 2 hours after meals. Take as instructed per MD. 30 tablet 0   furosemide  (LASIX ) 20 MG tablet Take 0.5 tablets (10 mg total) by mouth daily as needed for edema. 20 tablet 0   hydrALAZINE  (APRESOLINE ) 10 MG tablet TAKE 1 TABLET 2 (TWO) TIMES DAILY AS NEEDED. TAKE FOR SYSTOLIC BLOOD PRESSURE MORE THAN 170. 180 tablet 3   olmesartan  (BENICAR ) 20 MG tablet Take 1 tablet (20 mg total) by mouth daily. 30 tablet 11   pantoprazole  (PROTONIX ) 20 MG tablet TAKE 1 TABLET (20 MG TOTAL) BY MOUTH DAILY. TAKE WHEN YOU ARE PREDNISONE  90 tablet 1   predniSONE  (DELTASONE ) 5 MG tablet Take 1 tab daily 7/12-7/18, and take half tab daily 7/19-7/25 10 tablet 0   No current facility-administered medications on file prior to visit.    No Known Allergies  Past Medical History:   Diagnosis Date   Headache    migraines   Hepatitis    HX OF hEP c ?    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    Past Surgical History:  Procedure Laterality Date   BREAST BIOPSY Left 03/26/2024   US  LT BREAST BX W LOC DEV 1ST LESION IMG BX SPEC US  GUIDE 03/26/2024 [facility] MAMMOGRAPHY   DECOMPRESSIVE LUMBAR LAMINECTOMY LEVEL 1 N/A 11/20/2023   Procedure: lumbar laminectomy right Lumbar five with decompression of nerve roots and excision of tumor;  Surgeon: Colon Shove, MD;  Location: [facility] OR;  Service: Neurosurgery;  Laterality: N/A;   IR RADIOLOGIST EVAL & MGMT  01/25/2020   IR RADIOLOGIST EVAL & MGMT  03/21/2020   IR RADIOLOGIST EVAL & MGMT  06/07/2020   IR RADIOLOGIST EVAL & MGMT  09/05/2020   IR RADIOLOGIST EVAL & MGMT  12/05/2020   IR RADIOLOGIST EVAL & MGMT  05/09/2021   IR RADIOLOGIST EVAL & MGMT  09/06/2021   IR RADIOLOGIST EVAL & MGMT  12/12/2021   IR RADIOLOGIST EVAL & MGMT  06/25/2022   IR RADIOLOGIST EVAL & MGMT  07/24/2022   RADIOLOGY WITH ANESTHESIA N/A 03/01/2020   Procedure: CT WITH ANESTHESIA  THERMAL MICROWAVE  ABLATIION;  Surgeon: Karalee Beat, MD;  Location: [facility] ORS;  Service: Anesthesiology;  Laterality: N/A;   RADIOLOGY WITH ANESTHESIA N/A 07/17/2022   Procedure: CT MICROWAVE  ABLATION;  Surgeon: Karalee Wilkie POUR, MD;  Location: [facility] OR[facility];  Service: Radiology;  Laterality: N/A;    Family History  Problem Relation Age of Onset   Arthritis Mother    Cancer Neg Hx    Diabetes Neg Hx    Drug abuse Neg Hx    Heart disease Neg Hx    Hyperlipidemia Neg Hx    Hypertension Neg Hx    Kidney disease Neg Hx    Stroke Neg Hx     Social History   Socioeconomic History   Marital status: Divorced    Spouse name: Not on file   Number of children: 2   Years of education: 12   Highest education level: GED or equivalent  Occupational History   Occupation: Clinical biochemist: [employer]    Comment: Out on disability for now   Tobacco Use   Smoking status: Former    Passive exposure: Current   Smokeless tobacco: Never   Tobacco comments:    3 per week  Vaping Use   Vaping status: Never Used  Substance and Sexual Activity   Alcohol use: Not Currently   Drug use: No   Sexual activity: Not Currently  Other Topics Concern   Not on file  Social History Narrative   Divorced but has relationship with ex   2 children   Daughter lives with him   Son in [place]      No living will   Ex wife Myraette should make decisions on his behalf   Would accept resuscitation   Not sure about tube feeds   Social Drivers of Health   Financial Resource Strain: Low Risk  (05/06/2024)   Overall Financial Resource Strain (CARDIA)    Difficulty of Paying Living Expenses: Not very hard  Food Insecurity: No Food Insecurity (05/07/2024)   Hunger Vital Sign    Worried About Running Out of Food in the Last Year: Never true    Ran Out of Food in the Last Year: Never true  Transportation Needs: No Transportation Needs (05/06/2024)   PRAPARE - Administrator, Civil Service (Medical): No    Lack of Transportation (Non-Medical): No  Physical Activity: Sufficiently Active (05/06/2024)   Exercise Vital Sign    Days of Exercise per Week: 7 days    Minutes of Exercise per Session: 150+ min  Stress: Stress Concern Present (05/06/2024)   [employer] of Occupational Health - Occupational Stress Questionnaire    Feeling of Stress: To some extent  Social Connections: Moderately Integrated (05/06/2024)   Social Connection and Isolation Panel    Frequency of Communication with Friends and Family: Twice a week    Frequency of Social Gatherings with Friends and Family: Twice a week    Attends Religious Services: More than 4 times per year    Active Member of [employer] or Organizations: Yes    Attends Banker Meetings: 1 to 4 times per year    Marital Status: Divorced  Intimate Partner Violence: Not At Risk  (05/07/2024)   Humiliation, Afraid, Rape, and Kick questionnaire    Fear of Current or Ex-Partner: No    Emotionally Abused: No    Physically Abused: No    Sexually Abused: No   Review of Systems  Constitutional:  Negative for fatigue.       Has regained his weight Wears seat belt  HENT:  Negative for hearing loss, tinnitus and trouble swallowing.  Has full dentures  Eyes:  Negative for visual disturbance.       No diplopia or unilateral vision loss  Gastrointestinal:  Negative for blood in stool.       Diarrhea for a while---now better Heartburn controlled  Endocrine: Negative for polydipsia and polyuria.  Genitourinary:  Negative for difficulty urinating and urgency.       No sexual problems  Musculoskeletal:  Positive for back pain. Negative for arthralgias and joint swelling.  Skin:  Negative for rash.  Allergic/Immunologic: Negative for environmental allergies and immunocompromised state.  Neurological:  Positive for dizziness and syncope. Negative for headaches.       Seen in ER once for syncope  Hematological:  Negative for adenopathy. Does not bruise/bleed easily.  Psychiatric/Behavioral:  Negative for dysphoric mood. The patient is not nervous/anxious.        Chronic sleep issues       Objective:   Physical Exam Constitutional:      Appearance: Normal appearance.  HENT:     Mouth/Throat:     Pharynx: No oropharyngeal exudate or posterior oropharyngeal erythema.   Eyes:     Conjunctiva/sclera: Conjunctivae normal.     Pupils: Pupils are equal, round, and reactive to light.    Cardiovascular:     Rate and Rhythm: Normal rate and regular rhythm.     Pulses: Normal pulses.     Heart sounds:     No gallop.     Comments: Loud aortic systolic murmur and softer diastolic murmur Pulmonary:     Effort: Pulmonary effort is normal.     Breath sounds: Normal breath sounds. No wheezing or rales.  Abdominal:     Palpations: Abdomen is soft.     Tenderness: There  is no abdominal tenderness.   Musculoskeletal:     Cervical back: Neck supple.     Comments: 2+ edema in both ankles/feet only  Lymphadenopathy:     Cervical: No cervical adenopathy.   Skin:    Findings: No lesion or rash.   Neurological:     General: No focal deficit present.     Mental Status: He is alert and oriented to person, place, and time.     Comments: Right foot drop  Psychiatric:        Mood and Affect: Mood normal.        Behavior: Behavior normal.            Assessment & Plan:

## 2024-05-10 NOTE — Assessment & Plan Note (Signed)
 Finishing RT and only oral meds as well

## 2024-05-10 NOTE — Assessment & Plan Note (Signed)
 BP Readings from Last 3 Encounters:  05/10/24 (!) 148/63  05/07/24 (!) 140/50  05/05/24 (!) 144/60   Would not increase meds given his AS Olmesartan  20 daily

## 2024-05-10 NOTE — Telephone Encounter (Signed)
 Scheduled appointments per 6/27 los. Talked with the patient and he is aware of the made appointments.

## 2024-05-10 NOTE — Assessment & Plan Note (Signed)
 Likely part of his ER visit for syncope Seeing cardiology

## 2024-05-10 NOTE — Patient Instructions (Signed)
 I would recommend the one time RSV vaccine anytime now. Get the new flu and COVID vaccines this fall (September or October)

## 2024-05-10 NOTE — Assessment & Plan Note (Signed)
 Has been responding again to cancer Rx Not sure about other cancer screening --would leave up to Dr Lanny Recommended RSV, flu and COVID by the fall Hopes to get back to work at [employer]

## 2024-05-11 ENCOUNTER — Other Ambulatory Visit: Payer: Self-pay

## 2024-05-11 ENCOUNTER — Ambulatory Visit
Admission: RE | Admit: 2024-05-11 | Discharge: 2024-05-11 | Disposition: A | Discharge status: Home / Self Care | Source: Ambulatory Visit | Attending: Radiation Oncology | Admitting: Radiation Oncology

## 2024-05-11 DIAGNOSIS — K7460 Unspecified cirrhosis of liver: Secondary | ICD-10-CM | POA: Insufficient documentation

## 2024-05-11 DIAGNOSIS — M21379 Foot drop, unspecified foot: Secondary | ICD-10-CM | POA: Insufficient documentation

## 2024-05-11 DIAGNOSIS — R911 Solitary pulmonary nodule: Secondary | ICD-10-CM | POA: Insufficient documentation

## 2024-05-11 DIAGNOSIS — Z87891 Personal history of nicotine dependence: Secondary | ICD-10-CM | POA: Insufficient documentation

## 2024-05-11 DIAGNOSIS — R011 Cardiac murmur, unspecified: Secondary | ICD-10-CM | POA: Insufficient documentation

## 2024-05-11 DIAGNOSIS — C220 Liver cell carcinoma: Secondary | ICD-10-CM | POA: Insufficient documentation

## 2024-05-11 DIAGNOSIS — M7989 Other specified soft tissue disorders: Secondary | ICD-10-CM | POA: Insufficient documentation

## 2024-05-11 DIAGNOSIS — I10 Essential (primary) hypertension: Secondary | ICD-10-CM | POA: Insufficient documentation

## 2024-05-11 DIAGNOSIS — I700 Atherosclerosis of aorta: Secondary | ICD-10-CM | POA: Insufficient documentation

## 2024-05-11 DIAGNOSIS — Z7982 Long term (current) use of aspirin: Secondary | ICD-10-CM | POA: Insufficient documentation

## 2024-05-11 DIAGNOSIS — Z7952 Long term (current) use of systemic steroids: Secondary | ICD-10-CM | POA: Insufficient documentation

## 2024-05-11 DIAGNOSIS — R600 Localized edema: Secondary | ICD-10-CM | POA: Insufficient documentation

## 2024-05-11 DIAGNOSIS — Z510 Encounter for antineoplastic radiation therapy: Secondary | ICD-10-CM | POA: Insufficient documentation

## 2024-05-11 DIAGNOSIS — C7951 Secondary malignant neoplasm of bone: Secondary | ICD-10-CM | POA: Insufficient documentation

## 2024-05-11 DIAGNOSIS — N200 Calculus of kidney: Secondary | ICD-10-CM | POA: Insufficient documentation

## 2024-05-11 DIAGNOSIS — I2510 Atherosclerotic heart disease of native coronary artery without angina pectoris: Secondary | ICD-10-CM | POA: Insufficient documentation

## 2024-05-11 DIAGNOSIS — N281 Cyst of kidney, acquired: Secondary | ICD-10-CM | POA: Insufficient documentation

## 2024-05-11 DIAGNOSIS — Z79899 Other long term (current) drug therapy: Secondary | ICD-10-CM | POA: Insufficient documentation

## 2024-05-11 DIAGNOSIS — M79669 Pain in unspecified lower leg: Secondary | ICD-10-CM | POA: Diagnosis not present

## 2024-05-11 DIAGNOSIS — Z923 Personal history of irradiation: Secondary | ICD-10-CM | POA: Diagnosis not present

## 2024-05-11 LAB — RAD ONC ARIA SESSION SUMMARY
Course Elapsed Days: 6
Plan Fractions Treated to Date: 3
Plan Prescribed Dose Per Fraction: 10 Gy
Plan Total Fractions Prescribed: 5
Plan Total Prescribed Dose: 50 Gy
Reference Point Dosage Given to Date: 30 Gy
Reference Point Session Dosage Given: 10 Gy
Session Number: 3

## 2024-05-13 ENCOUNTER — Other Ambulatory Visit: Payer: Self-pay

## 2024-05-13 ENCOUNTER — Inpatient Hospital Stay: Attending: Hematology

## 2024-05-13 ENCOUNTER — Other Ambulatory Visit: Payer: Self-pay | Admitting: Hematology

## 2024-05-13 ENCOUNTER — Ambulatory Visit
Admission: RE | Admit: 2024-05-13 | Discharge: 2024-05-13 | Disposition: A | Discharge status: Home / Self Care | Source: Ambulatory Visit | Attending: Radiation Oncology | Admitting: Radiation Oncology

## 2024-05-13 DIAGNOSIS — N200 Calculus of kidney: Secondary | ICD-10-CM | POA: Insufficient documentation

## 2024-05-13 DIAGNOSIS — Z87891 Personal history of nicotine dependence: Secondary | ICD-10-CM | POA: Insufficient documentation

## 2024-05-13 DIAGNOSIS — Z7952 Long term (current) use of systemic steroids: Secondary | ICD-10-CM | POA: Insufficient documentation

## 2024-05-13 DIAGNOSIS — C7951 Secondary malignant neoplasm of bone: Secondary | ICD-10-CM | POA: Insufficient documentation

## 2024-05-13 DIAGNOSIS — Z7982 Long term (current) use of aspirin: Secondary | ICD-10-CM | POA: Insufficient documentation

## 2024-05-13 DIAGNOSIS — I10 Essential (primary) hypertension: Secondary | ICD-10-CM | POA: Insufficient documentation

## 2024-05-13 DIAGNOSIS — C220 Liver cell carcinoma: Secondary | ICD-10-CM | POA: Insufficient documentation

## 2024-05-13 DIAGNOSIS — M79669 Pain in unspecified lower leg: Secondary | ICD-10-CM | POA: Insufficient documentation

## 2024-05-13 DIAGNOSIS — Z510 Encounter for antineoplastic radiation therapy: Secondary | ICD-10-CM | POA: Insufficient documentation

## 2024-05-13 DIAGNOSIS — R011 Cardiac murmur, unspecified: Secondary | ICD-10-CM | POA: Insufficient documentation

## 2024-05-13 DIAGNOSIS — M21379 Foot drop, unspecified foot: Secondary | ICD-10-CM | POA: Insufficient documentation

## 2024-05-13 DIAGNOSIS — Z923 Personal history of irradiation: Secondary | ICD-10-CM | POA: Insufficient documentation

## 2024-05-13 DIAGNOSIS — K7460 Unspecified cirrhosis of liver: Secondary | ICD-10-CM | POA: Insufficient documentation

## 2024-05-13 DIAGNOSIS — Z79899 Other long term (current) drug therapy: Secondary | ICD-10-CM | POA: Insufficient documentation

## 2024-05-13 DIAGNOSIS — I2510 Atherosclerotic heart disease of native coronary artery without angina pectoris: Secondary | ICD-10-CM | POA: Insufficient documentation

## 2024-05-13 DIAGNOSIS — N281 Cyst of kidney, acquired: Secondary | ICD-10-CM | POA: Insufficient documentation

## 2024-05-13 DIAGNOSIS — R911 Solitary pulmonary nodule: Secondary | ICD-10-CM | POA: Insufficient documentation

## 2024-05-13 DIAGNOSIS — I700 Atherosclerosis of aorta: Secondary | ICD-10-CM | POA: Insufficient documentation

## 2024-05-13 DIAGNOSIS — R600 Localized edema: Secondary | ICD-10-CM | POA: Insufficient documentation

## 2024-05-13 LAB — CBC WITH DIFFERENTIAL (CANCER CENTER ONLY)
Abs Immature Granulocytes: 0.04 10*3/uL (ref 0.00–0.07)
Basophils Absolute: 0 10*3/uL (ref 0.0–0.1)
Basophils Relative: 0 %
Eosinophils Absolute: 0.1 10*3/uL (ref 0.0–0.5)
Eosinophils Relative: 1 %
HCT: 34.2 % — ABNORMAL LOW (ref 39.0–52.0)
Hemoglobin: 10.9 g/dL — ABNORMAL LOW (ref 13.0–17.0)
Immature Granulocytes: 1 %
Lymphocytes Relative: 5 %
Lymphs Abs: 0.3 10*3/uL — ABNORMAL LOW (ref 0.7–4.0)
MCH: 32 pg (ref 26.0–34.0)
MCHC: 31.9 g/dL (ref 30.0–36.0)
MCV: 100.3 fL — ABNORMAL HIGH (ref 80.0–100.0)
Monocytes Absolute: 0.3 10*3/uL (ref 0.1–1.0)
Monocytes Relative: 5 %
Neutro Abs: 5.1 10*3/uL (ref 1.7–7.7)
Neutrophils Relative %: 88 %
Platelet Count: 135 10*3/uL — ABNORMAL LOW (ref 150–400)
RBC: 3.41 MIL/uL — ABNORMAL LOW (ref 4.22–5.81)
RDW: 19.4 % — ABNORMAL HIGH (ref 11.5–15.5)
WBC Count: 5.8 10*3/uL (ref 4.0–10.5)
nRBC: 0 % (ref 0.0–0.2)

## 2024-05-13 LAB — RAD ONC ARIA SESSION SUMMARY
Course Elapsed Days: 8
Plan Fractions Treated to Date: 4
Plan Prescribed Dose Per Fraction: 10 Gy
Plan Total Fractions Prescribed: 5
Plan Total Prescribed Dose: 50 Gy
Reference Point Dosage Given to Date: 40 Gy
Reference Point Session Dosage Given: 10 Gy
Session Number: 4

## 2024-05-13 LAB — CMP (CANCER CENTER ONLY)
ALT: 67 U/L — ABNORMAL HIGH (ref 0–44)
AST: 48 U/L — ABNORMAL HIGH (ref 15–41)
Albumin: 3.7 g/dL (ref 3.5–5.0)
Alkaline Phosphatase: 438 U/L — ABNORMAL HIGH (ref 38–126)
Anion gap: 4 — ABNORMAL LOW (ref 5–15)
BUN: 20 mg/dL (ref 8–23)
CO2: 33 mmol/L — ABNORMAL HIGH (ref 22–32)
Calcium: 9.5 mg/dL (ref 8.9–10.3)
Chloride: 105 mmol/L (ref 98–111)
Creatinine: 1.01 mg/dL (ref 0.61–1.24)
GFR, Estimated: >60 mL/min (ref >60)
Glucose, Bld: 83 mg/dL (ref 70–99)
Potassium: 4.2 mmol/L (ref 3.5–5.1)
Sodium: 142 mmol/L (ref 135–145)
Total Bilirubin: 0.9 mg/dL (ref 0.0–1.2)
Total Protein: 6.8 g/dL (ref 6.5–8.1)

## 2024-05-17 ENCOUNTER — Other Ambulatory Visit: Payer: Self-pay

## 2024-05-17 ENCOUNTER — Ambulatory Visit
Admission: RE | Admit: 2024-05-17 | Discharge: 2024-05-17 | Disposition: A | Discharge status: Home / Self Care | Source: Ambulatory Visit | Attending: Radiation Oncology | Admitting: Radiation Oncology

## 2024-05-17 ENCOUNTER — Ambulatory Visit

## 2024-05-17 DIAGNOSIS — C220 Liver cell carcinoma: Secondary | ICD-10-CM | POA: Diagnosis not present

## 2024-05-17 LAB — RAD ONC ARIA SESSION SUMMARY
Course Elapsed Days: 12
Plan Fractions Treated to Date: 5
Plan Prescribed Dose Per Fraction: 10 Gy
Plan Total Fractions Prescribed: 5
Plan Total Prescribed Dose: 50 Gy
Reference Point Dosage Given to Date: 50 Gy
Reference Point Session Dosage Given: 10 Gy
Session Number: 5

## 2024-05-17 NOTE — Telephone Encounter (Signed)
 Oral Chemotherapy Pharmacist Encounter   Called patient to for discussion and initial counseling on Cabometyx  (cabozantinib). Informed by patient that Cabometyx  was delivered to his home over the weekend (originally, per [facility], medication should have delivered to patient's home today) and patient took first dose of Cabometyx  20 mg on 05/17/24 AM.   Staff message sent to Dr. Lanny to see if OK for patient to switch start date to today, as originally plan was for patient to start 05/24/24. Will call patient back once this is confirmed.   Corey Armstrong, PharmD, BCPS, BCOP Hematology/Oncology Clinical Pharmacist [facility] Oral Chemotherapy Navigation Clinics [phone] 05/17/2024 9:51 AM

## 2024-05-17 NOTE — Telephone Encounter (Signed)
 Oral Oncology Pharmacist Encounter  Confirmed with Dr. Lanny that she would like patient to not take anymore Cabometyx  doses this week and resume on 05/24/24, the original planned start date.   Called patient and informed him to not take anymore dose of the Cabometyx  at this time and that he will start 05/24/24. I will call patient later this week for detailed discussion of his Cabometyx  taper schedule plan.   Asberry Macintosh, PharmD, BCPS, BCOP Hematology/Oncology Clinical Pharmacist [facility] and [facility] Oral Chemotherapy Navigation Clinics [phone] 05/17/2024 3:36 PM

## 2024-05-18 NOTE — Radiation Completion Notes (Addendum)
  Radiation Oncology         [phone] ________________________________  Name: Corey Tarri Sierra Sr. MRN: [id number]  Date: 05/17/2024  DOB: 1956/08/03 Referring Physician: CHARLIE DENISE, M.D. Date of Service: 2024-05-18 Radiation Oncologist: Adina Barge, M.D. [facility]     RADIATION ONCOLOGY END OF TREATMENT NOTE     Diagnosis: 68 y.o. man with a new 1.1 cm posterior segment 7 liver lesion secondary to oligo progression of  hepatocellular carcinoma.   Intent: Curative     ==========DELIVERED PLANS==========  First Treatment Date: 2024-05-05 Last Treatment Date: 2024-05-17   Plan Name: [facility] Site: Liver Technique: SBRT/SRT-IMRT Mode: Photon Dose Per Fraction: 10 Gy Prescribed Dose (Delivered / Prescribed): 50 Gy / 50 Gy Prescribed Fxs (Delivered / Prescribed): 5 / 5     ==========ON TREATMENT VISIT DATES========== 2024-05-05, 2024-05-07, 2024-05-11, 2024-05-13, 2024-05-17, 2024-05-17     See weekly On Treatment Notes in Epic for details in the Media tab (listed as Progress notes on the On Treatment Visit Dates listed above).  He tolerated the radiation treatments relatively well, with only modest fatigue.   The patient will receive a call in about one month from the radiation oncology department. He will continue follow up with his medical oncologist, Dr. Lanny, as well.  ------------------------------------------------   Donnice Barge, MD [facility]  Radiation Oncology Direct Dial: [phone]  Fax: [phone] [email or web address]  Skype  LinkedIn

## 2024-05-19 NOTE — Telephone Encounter (Signed)
 Oral Chemotherapy Pharmacist Encounter  I spoke with patient for overview of: Cabometyx  (cabozantinib) for the treatment of metastatic hepatocellular carcinoma, planned duration until disease progression or unacceptable drug toxicity.   Counseled patient on administration, dosing, side effects, monitoring, drug-food interactions, safe handling, storage, and disposal.  Patient will dose increase Cabometyx  dose to goal dose of 60 mg daily.  Week 1: 1 tablet (20 mg total)  by mouth daily on an empty stomach, 1 hour before or 2 hours after a meal. Week 2: 2 tablets (40 mg total) by mouth daily on an empty stomach, 1 hour before or 2 hours after a meal. Week 3 and thereafter: 3 tablets (60 mg total) by mouth daily on an empty stomach, 1 hour before or 2 hours after a meal.  Once patient gets to his max tolerated dose - an updated prescription will need to be sent to [facility] for that specific tablet strength (I.e. either 40 mg tablet or 60 mg tablet). Based on current titration schedule patient should run out of 20mg  tablets ~06/08/24.  Patient knows to avoid grapefruit and grapefruit juice.  Cabometyx  start date: 05/24/24  Adverse effects include but are not limited to: diarrhea, nausea, decreased appetite, fatigue, hypertension, hand-foot syndrome, decreased blood counts, and electrolyte abnormalities. Patient will obtain anti diarrheal and alert the office of 4 or more loose stools above baseline. Hand-foot syndrome: discussed use of cream such as [place] Smooth Extra Care 20 or equivalent advanced care cream that has 20% urea content for advanced skin hydration while on Cabometyx .  Diarrhea: Patient will obtain Imodium (loperamide) to have on hand if they experience diarrhea. Patient knows to alert the office of 4 or more loose stools above baseline. Delayed wound healing: Cabometyx  should be held at least 3 weeks prior to any scheduled surgery (including dental surgery) and not  resumed until at least 2 weeks after major surgery and until adequate wound healing is established. N/V PPX: Patient has antiemetic on hand at home and knows to use if needed for nausea/vomiting.  HTN: Patient will keep a blood pressure log at home and alert office if they notice an increase in blood pressure, increased headaches or dizziness.  Reviewed with patient importance of keeping a medication schedule and plan for any missed doses. No barriers to medication adherence identified.  Medication reconciliation performed and medication/allergy list updated.  Distress thermometer not completed during telephone call as patient has been on previous lines of therapy.   All questions answered.  Mr. Gallick voiced understanding and appreciation.   Medication education handout and medication calendar placed in mail for patient. Patient knows to call the office with questions or concerns. Oral Chemotherapy Clinic phone number provided to patient.   Asberry Macintosh, PharmD, BCPS, BCOP Hematology/Oncology Clinical Pharmacist [facility] and [facility] Oral Chemotherapy Navigation Clinics [phone] 05/19/2024 10:50 AM

## 2024-05-20 ENCOUNTER — Other Ambulatory Visit: Payer: Self-pay | Admitting: Hematology

## 2024-05-20 DIAGNOSIS — C220 Liver cell carcinoma: Secondary | ICD-10-CM

## 2024-06-01 ENCOUNTER — Inpatient Hospital Stay

## 2024-06-01 ENCOUNTER — Inpatient Hospital Stay: Admitting: Nurse Practitioner

## 2024-06-01 ENCOUNTER — Inpatient Hospital Stay (HOSPITAL_BASED_OUTPATIENT_CLINIC_OR_DEPARTMENT_OTHER): Admitting: Hematology

## 2024-06-01 ENCOUNTER — Encounter: Payer: Self-pay | Admitting: Hematology

## 2024-06-01 ENCOUNTER — Telehealth: Payer: Self-pay | Admitting: Hematology

## 2024-06-01 VITALS — BP 134/64 | HR 87 | Temp 98.1°F | Resp 16 | Ht 68.5 in | Wt 133.3 lb

## 2024-06-01 DIAGNOSIS — C220 Liver cell carcinoma: Secondary | ICD-10-CM

## 2024-06-01 LAB — CMP (CANCER CENTER ONLY)
ALT: 106 U/L — ABNORMAL HIGH (ref 0–44)
AST: 84 U/L — ABNORMAL HIGH (ref 15–41)
Albumin: 3.7 g/dL (ref 3.5–5.0)
Alkaline Phosphatase: 421 U/L — ABNORMAL HIGH (ref 38–126)
Anion gap: 7 (ref 5–15)
BUN: 16 mg/dL (ref 8–23)
CO2: 25 mmol/L (ref 22–32)
Calcium: 9.4 mg/dL (ref 8.9–10.3)
Chloride: 111 mmol/L (ref 98–111)
Creatinine: 0.96 mg/dL (ref 0.61–1.24)
GFR, Estimated: >60 mL/min (ref >60)
Glucose, Bld: 124 mg/dL — ABNORMAL HIGH (ref 70–99)
Potassium: 4.4 mmol/L (ref 3.5–5.1)
Sodium: 143 mmol/L (ref 135–145)
Total Bilirubin: 0.4 mg/dL (ref 0.0–1.2)
Total Protein: 7 g/dL (ref 6.5–8.1)

## 2024-06-01 LAB — CBC WITH DIFFERENTIAL (CANCER CENTER ONLY)
Abs Immature Granulocytes: 0.01 K/uL (ref 0.00–0.07)
Basophils Absolute: 0 K/uL (ref 0.0–0.1)
Basophils Relative: 1 %
Eosinophils Absolute: 0.1 K/uL (ref 0.0–0.5)
Eosinophils Relative: 2 %
HCT: 39.8 % (ref 39.0–52.0)
Hemoglobin: 12.8 g/dL — ABNORMAL LOW (ref 13.0–17.0)
Immature Granulocytes: 0 %
Lymphocytes Relative: 21 %
Lymphs Abs: 0.9 K/uL (ref 0.7–4.0)
MCH: 32.1 pg (ref 26.0–34.0)
MCHC: 32.2 g/dL (ref 30.0–36.0)
MCV: 99.7 fL (ref 80.0–100.0)
Monocytes Absolute: 0.4 K/uL (ref 0.1–1.0)
Monocytes Relative: 8 %
Neutro Abs: 2.8 K/uL (ref 1.7–7.7)
Neutrophils Relative %: 68 %
Platelet Count: 164 K/uL (ref 150–400)
RBC: 3.99 MIL/uL — ABNORMAL LOW (ref 4.22–5.81)
RDW: 17 % — ABNORMAL HIGH (ref 11.5–15.5)
WBC Count: 4.2 K/uL (ref 4.0–10.5)
nRBC: 0 % (ref 0.0–0.2)

## 2024-06-01 MED ORDER — CABOMETYX 60 MG PO TABS: 60.0000 mg | ORAL_TABLET | Freq: Every day | ORAL | 0 refills | Status: DC

## 2024-06-01 NOTE — Progress Notes (Signed)
 [facility]   Telephone:([phone]) [phone] Fax:(336) [phone]   Clinic Follow up Note   Patient Care Team: [name], MD as PCP - General (Internal Medicine) [name], MD as PCP - Cardiology (Cardiology) [name], MD as Attending Physician (Hematology and Oncology) [name], [name], NP as Nurse Practitioner (Hospice and Palliative Medicine) Imaging, [facility] as Radiologist (Diagnostic Radiology)  Date of Service:  06/01/2024  CHIEF COMPLAINT: f/u of HCC  CURRENT THERAPY:  Cabozantinib with ramping dose  Oncology History   Hepatocellular carcinoma in adult [facility]) diagnosed 11/2019 by MRI for f/u of cirrhosis. S/p microwave ablation on 03/01/20. -MRI 06/01/22 showed suspicion for local recurrence and metastasis at L5. Biopsy of L5 confirmed metastatic HCC. S/p repeat microwave ablation on 07/17/22. -started lenvima  08/16/22 -staging CT CAP and bone scan on 09/19/22 showed: interval enlargement of right hepatic lobe HCC; new retropulsed bony fragment at L5; no other new lesions. His previous CT was in 05/2022 and he did not start Lenvima  until 07/2022, so it's hard to evaluate if he is responding to Lenvima  or not, the progression is likely the nature course of his [facility]  -Restaging CT scan from January 02, 2023 showed slightly improved previously treated liver lesion, and additional area of concern in the liver which are difficult to compare with previous scan due to different timing of contrast.  No other evidence of disease progression.  Plan to obtain MRI as next scan.  I personally reviewed the CT scan images and discussed the findings with patient -Will continue Lenvima , he is tolerating well overall. -Due to his weight being above 60 kg now, I increased his Lenvima  to 12 mg daily in Feb 2024 -repeated liver MRI on 04/23/2023 showed stable treated liver lesion, L5 bone mets has slightly increased in size.  Patient developed worsening  low back pain in July 2024, repeated lumbar MRI showed significant disease progression in the L5 bone lesion.  I personally reviewed the scan images and discussed the findings with patient. -I recommend palliative radiation to his L5 lesion for pain control -I recommend changing systemic treatment to immunotherapy, I discussed option of atezolizumab and bevacizumab, durvalumab  and Imjudo , nivo and ipi etc. since he previously progressed on oral TKI, I recommend him to proceed with durvalumab  and Imjudo . He started on 06/30/23 -the goal of therapy is curative/palliative to prolong life and prevent/improve cancer related symptoms. -he underwent Bilateral laminotomies L5-S1 with decompression of the spinal canal and the L5 nerve roots epidural tumor by Dr. Colon On 11/20/2023 -he received SBRT to low back in Feb 2025 -CT and abdominal MRI showed new enhancing lesion posteriorly in segment 7, suspicious for recurrence/metastasis, otherwise stable treated liver lesions and bone mets.  I discussed option of ablation versus SBRT radiation.  Patient prefers SBRT. -due to worsening LFTs, I stopped Durvalumab  in May 2025  -I referred him back to Dr. Patrcia and plan to change systemic therapy to carbozentinib when his LFT improves  -He started SBRT to new oligo liver met on 05/05/2024 and finished on 05/17/24 -He started ramping dose of cabozantinib on May 24, 2024.  Assessment & Plan Hepatocellular carcinoma Hepatocellular carcinoma under treatment with cabozantinib. Completed radiation therapy on July 7th. Currently on the second week of cabozantinib, taking two tablets daily, with plans to increase to three tablets next week. No reported side effects such as skin problems, nausea, or diarrhea. Liver function is well-managed with normal bilirubin levels. Hemoglobin has improved to 12.8  from previous levels of 9 and 11. No bleeding reported. - Continue cabozantinib, increase to three tablets daily next week -  Monitor for side effects, especially gastrointestinal issues like nausea and diarrhea - Call in a new prescription for 60 mg tablets of cabozantinib - Schedule a follow-up scan in early October if treatment is well tolerated  Foot drop Foot drop with associated pain in the calf, particularly at night. No swelling currently observed. The condition is likely related to the use of a brace for foot drop. No use of cane or other assistive devices reported. - Encourage exercises such as paddling or stationary biking to gain muscle strength  Plan - He is on second week of cabozantinib, 40 mg daily, tolerating well, plan to increase to full dose 60 mg daily next Monday, I sent a prescription of 60 mg to his pharmacy today - Lab and follow-up in 3 weeks   SUMMARY OF ONCOLOGIC HISTORY: Oncology History  Hepatocellular carcinoma in adult [facility])  12/08/2019 Imaging   MR ABDOMEN WWO CONTRAST   IMPRESSION: Two small masses in the right hepatic lobe which have characteristics diagnostic for hepatocellular carcinoma in the setting of cirrhosis. (LI-RADS Category 5: Definitely HCC)   No evidence of abdominal metastatic disease or other significant abnormality.   03/01/2020 Initial Diagnosis   Hepatocellular carcinoma (HCC)   03/01/2020 Procedure   CT GUIDE TISSUE ABLATION     IMPRESSION: Successful percutaneous thermal ablation.   Signed,   [name], MD, RPVI   06/02/2020 Imaging   MR ABDOMEN WWO CONTRAST   IMPRESSION: 1. There has been interval ablation at two adjacent sites of the subcapsular right lobe of the liver, hepatic segment VI. There is no residual contrast enhancement at these adjacent sites. LI-RADS category 5 TR, nonviable. 2. There are scattered, subcentimeter foci of arterial hyperenhancement of the anterior right lobe of the liver, for example adjacent to the ablation site measuring 3 mm and in the medial anterior right lobe of the liver measuring 6 mm. This  larger focus is unchanged compared to prior. Others were not previously seen. These findings are nonspecific and consistent with LI-RADS category 3. Attention on follow-up.   09/02/2020 Imaging   MR ABDOMEN WWO CONTRAST     IMPRESSION: 1. Post radiofrequency ablation along the margin of the RIGHT hemi liver. Heterogeneous appearance of this area on both T1 and T2. No sign of residual enhancement. LR TR nonviable. 2. Very subtle focus of restricted diffusion in the cephalad lateral segment LEFT hepatic lobe measuring 7 mm, corresponding to low signal on image 27 of series 21. Arterial enhancement is not seen in this area and this areas not seen on prior imaging. Not clear whether this represents a new lesion as this area is obscured largely by artifact on most sequences on today's study and on previous imaging evaluations. LR category 3, consider a 3 to six-month follow-up with abdominal MRI. 3. Lesions of concern scattered about the RIGHT hepatic lobe may represent perfusional anomalies as they were not seen on today's study. 4. Stable mild dilation of the biliary tree.   11/27/2020 Imaging   MR ABDOMEN WWO CONTRAST   IMPRESSION: Minimal decrease in size of ablation defect in the peripheral anterior right hepatic lobe. No evidence of locally recurrent hepatocellular carcinoma or other suspicious liver lesions.   No evidence of metastatic disease or other acute findings.   04/03/2021 Imaging   MR ABDOMEN WWO CONTRAST   IMPRESSION: Stable ablation defect in the  lateral right hepatic lobe, without evidence of recurrent tumor at this site.   5 mm hypovascular lesion in segment 5 of the right lobe was not definitely seen on previous study, but is too small to characterize. This shows no arterial phase hyperenhancement. Recommend continued follow-up by MRI in 6 months.   No evidence of abdominal metastatic disease.        09/03/2021 Imaging   MR ABDOMEN WWO CONTRAST    IMPRESSION: 1. Side-by-side ablation sites in the right hepatic lobe in segment 6. The more anterior site remains largely cystic in appearance and no findings suspicious recurrent tumor in this area. There is however abnormal contrast enhancement around the larger more posterior and lateral ablation site worrisome for recurrent tumor. 2. Several small foci of early arterial phase enhancement in the liver. The peripheral lesions are likely vascular shunts. The more central lesions are likely dysplastic nodules. Recommend continued surveillance. 3. Stable bilateral renal cysts.   12/07/2021 Imaging   MR ABDOMEN WWO CONTRAST  IMPRESSION: 1. Ablation defects in the right lobe of the liver, similar to the prior study, without definitive evidence to suggest local recurrence of disease or metastatic disease. 2. Multiple tiny arterial phase areas of nodular hyperenhancement scattered throughout the liver, predominantly in the right lobe of the liver, without perceptible signal abnormality or diffusion restriction on other pulse sequences, nonspecific, and likely small benign areas of vascular shunting. Continued attention on follow-up imaging is recommended. 3. Multiple Bosniak class 1 and Bosniak class 2 cysts in the kidneys bilaterally, similar to the prior study, as above. 4. Aortic atherosclerosis.     06/01/2022 Imaging   MR ABDOMEN WWO CONTRAST   IMPRESSION: 1. LR TR equivocal but suspicious findings with respect to previously treated disease in the RIGHT hepatic lobe given nodular enhancement adjacent and anterior superior to the ablation zone as described. 2. Given lack of late arterial phase due to bolus timing a second area of abnormality within the liver is suspicious for additional site of hepatocellular carcinoma showing washout appearance and capsule appearance on current imaging, cardiac pulsation limiting assessment in the area of concern. 3. Suspect bony  metastatic disease in the lower lumbar spine at L5 level. Dedicated spinal imaging may be helpful for further evaluation.     06/18/2022 Imaging   MR Lumbar Spine W Wo Contrast   IMPRESSION: L5 vertebral body lesion is most consistent with a metastasis. There is probable trace ventral epidural extension   07/17/2022 Procedure   CT GUIDE TISSUE ABLATION   IMPRESSION: Successful CT-guided microwave ablation of recurrent hepatocellular carcinoma.   07/17/2022 Procedure   CT BONE TROCAR/NEEDLE BIOPSY SUPERFICIAL   IMPRESSION: Successful CT-guided core biopsy of L5 bone lesion.      07/17/2022 Pathology Results   SURGICAL PATHOLOGY  CASE: [id number]  PATIENT: CARRY ORTEZ  Surgical Pathology Report   SURGICAL PATHOLOGY  CASE: [id number]  PATIENT: ELLAREE SIERRA  Surgical Pathology Report      Clinical History: Hx of cirrhosis and small HCC with unexpected  suspicious lesion at L5. Rare HCC met to bone vs atypical hemangioma or  met from new primary. (crm)      FINAL MICROSCOPIC DIAGNOSIS:   A. 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 No acute findings within the chest. No suspicious findings for metastatic disease within the chest.  2. Unchanged 3 mm nodule within the medial right upper lobe. No new or suspicious lung nodules. 3. Suboptimal visualization of the known liver lesions, reflecting lack of IV contrast material. Similar appearance of ablation defect within the lateral right lobe of liver. 4. Left renal calculi. 5. Coronary artery calcifications. 6.  Aortic Atherosclerosis   06/25/2023 Imaging   NM Bone Scan whole body  IMPRESSION: 1. Known L5 metastasis demonstrates low level uptake, mildly increased from previous bone scan. 2. No new lesions demonstrated.    06/30/2023 - 03/10/2024 Chemotherapy   Patient is on Treatment Plan : Hepatocellular Carcinoma Tremelimumab -actl C1 D1 + Durvalumab  q28d       Imaging        Discussed the use of AI scribe software for clinical note transcription with the patient, who gave verbal consent to proceed.  History of Present Illness Corey Barstow Sr. is a 68 year old male with hepatocellular carcinoma who presents for follow-up.  He is in the second week of cabozantinib treatment for hepatocellular carcinoma, taking two pills every morning without experiencing skin issues, nausea, or diarrhea. He completed radiation therapy on July 7th and began cabozantinib a week later without complications from the radiation.  He is tapering off prednisone , currently at one and a half pills, with plans to complete the taper by Friday. He does not experience fatigue during the taper.  He experiences nocturnal pain in the lower leg, specifically the calf, which was previously swollen but has improved. He wears a brace and walks without a cane.  Blood sugar levels remain stable. Blood counts have improved, with hemoglobin rising to 12.8. No bleeding issues are present.     All other systems were reviewed with the patient and are negative.  MEDICAL HISTORY:  Past Medical History:  Diagnosis Date   Headache    migraines   Hepatitis    HX OF hEP c ?    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    SURGICAL HISTORY: Past Surgical History:  Procedure Laterality Date   BREAST BIOPSY Left 03/26/2024   US  LT BREAST BX W LOC DEV 1ST LESION IMG BX SPEC US  GUIDE 03/26/2024 [facility] MAMMOGRAPHY   DECOMPRESSIVE LUMBAR LAMINECTOMY LEVEL 1 N/A 11/20/2023   Procedure: lumbar laminectomy right Lumbar five with decompression of nerve roots and excision of tumor;  Surgeon: Colon Shove, MD;  Location: [facility] OR;  Service: Neurosurgery;  Laterality: N/A;   IR RADIOLOGIST EVAL & MGMT  01/25/2020   IR  RADIOLOGIST EVAL & MGMT  03/21/2020   IR RADIOLOGIST EVAL & MGMT  06/07/2020   IR RADIOLOGIST EVAL & MGMT  09/05/2020   IR RADIOLOGIST EVAL & MGMT  12/05/2020   IR RADIOLOGIST EVAL & MGMT  05/09/2021   IR RADIOLOGIST EVAL & MGMT  09/06/2021   IR RADIOLOGIST EVAL & MGMT  12/12/2021   IR RADIOLOGIST EVAL & MGMT  06/25/2022   IR RADIOLOGIST EVAL & MGMT  07/24/2022   RADIOLOGY WITH ANESTHESIA N/A 03/01/2020   Procedure: CT WITH ANESTHESIA  THERMAL MICROWAVE  ABLATIION;  Surgeon: Karalee Beat, MD;  Location: [facility] ORS;  Service: Anesthesiology;  Laterality: N/A;   RADIOLOGY WITH ANESTHESIA N/A 07/17/2022   Procedure: CT MICROWAVE ABLATION;  Surgeon: Karalee Beat POUR, MD;  Location: [facility] ORS;  Service: Radiology;  Laterality: N/A;    I have reviewed the social history and family history with the patient and they are unchanged from previous note.  ALLERGIES:  has no known allergies.  MEDICATIONS:  Current Outpatient Medications  Medication Sig Dispense Refill   cabozantinib (CABOMETYX ) 60 MG tablet Take 1 tablet (60 mg total) by mouth daily. Take on an empty stomach, 1 hour before or 2 hours after meals. 30 tablet 0   acetaminophen  (TYLENOL ) 500 MG tablet Take 1,000 mg by mouth every 6 (six) hours as needed.     aspirin  EC 81 MG tablet Take 1 tablet (81 mg total) by mouth daily. Swallow whole. 30 tablet 12   atorvastatin  (LIPITOR) 40 MG tablet Take 1 tablet (40 mg total) by mouth daily. 30 tablet 11   CABOMETYX  20 MG tablet TAKE 1 TABLET BY MOUTH 1 TIME A DAY. TAKE ON AN EMPTY STOMACH, 1 HOUR BEFORE OR 2 HOURS AFTER MEALS. TAKE AS INSTRUCTED PER MD. 30 tablet 0   furosemide  (LASIX ) 20 MG tablet Take 0.5 tablets (10 mg total) by mouth daily as needed for edema. 20 tablet 0   hydrALAZINE  (APRESOLINE ) 10 MG tablet TAKE 1 TABLET 2 (TWO) TIMES DAILY AS NEEDED. TAKE FOR SYSTOLIC BLOOD PRESSURE MORE THAN 170. 180 tablet 3   olmesartan  (BENICAR ) 20 MG tablet Take 1 tablet (20 mg total) by mouth daily. 30  tablet 11   pantoprazole  (PROTONIX ) 20 MG tablet TAKE 1 TABLET (20 MG TOTAL) BY MOUTH DAILY. TAKE WHEN YOU ARE PREDNISONE  90 tablet 1   predniSONE  (DELTASONE ) 5 MG tablet Take 1 tab daily 7/12-7/18, and take half tab daily 7/19-7/25 10 tablet 0   No current facility-administered medications for this visit.    PHYSICAL EXAMINATION: ECOG PERFORMANCE STATUS: 1 - Symptomatic but completely ambulatory  Vitals:   06/01/24 0925  BP: 134/64  Pulse: 87  Resp: 16  Temp: 98.1 F (36.7 C)  SpO2: 98%   Wt Readings from Last 3 Encounters:  06/01/24 133 lb 4.8 oz (60.5 kg)  05/10/24 146 lb (66.2 kg)  05/07/24 145 lb 3.2 oz (65.9 kg)     GENERAL:alert, no distress and comfortable SKIN: skin color, texture, turgor are normal, no rashes or significant lesions EYES: normal, Conjunctiva are pink and non-injected, sclera clear NECK: supple, thyroid  normal size, non-tender, without nodularity LYMPH:  no palpable lymphadenopathy in the cervical, axillary  LUNGS: clear to auscultation and percussion with normal breathing effort HEART: regular rate & rhythm and no murmurs and no lower extremity edema ABDOMEN:abdomen soft, non-tender and normal bowel sounds Musculoskeletal:no cyanosis of digits and no clubbing  NEURO: alert & oriented x 3 with fluent speech, no focal motor/sensory deficits  Physical Exam    LABORATORY DATA:  I have reviewed the data as listed    Latest Ref Rng & Units 06/01/2024    9:11 AM 05/13/2024   12:22 PM 05/05/2024    1:32 PM  CBC  WBC 4.0 - 10.5 K/uL 4.2  5.8  6.8   Hemoglobin 13.0 - 17.0 g/dL 87.1  89.0  9.6   Hematocrit 39.0 - 52.0 % 39.8  34.2  30.1   Platelets 150 - 400 K/uL 164  135  147         Latest Ref Rng & Units 06/01/2024    9:11 AM 05/13/2024   12:22 PM 05/05/2024    1:32 PM  CMP  Glucose 70 - 99 mg/dL 875  83  873   BUN 8 - 23 mg/dL 16  20  16    Creatinine 0.61 - 1.24 mg/dL 9.03  8.98  8.88   Sodium 135 - 145 mmol/L  143  142  144   Potassium 3.5 -  5.1 mmol/L 4.4  4.2  4.0   Chloride 98 - 111 mmol/L 111  105  110   CO2 22 - 32 mmol/L 25  33  27   Calcium  8.9 - 10.3 mg/dL 9.4  9.5  8.9   Total Protein 6.5 - 8.1 g/dL 7.0  6.8  6.6   Total Bilirubin 0.0 - 1.2 mg/dL 0.4  0.9  1.2   Alkaline Phos 38 - 126 U/L 421  438  558   AST 15 - 41 U/L 84  48  58   ALT 0 - 44 U/L 106  67  80       RADIOGRAPHIC STUDIES: I have personally reviewed the radiological images as listed and agreed with the findings in the report. No results found.    No orders of the defined types were placed in this encounter.  All questions were answered. The patient knows to call the clinic with any problems, questions or concerns. No barriers to learning was detected. The total time spent in the appointment was 30 minutes, including review of chart and various tests results, discussions about plan of care and coordination of care plan     Onita Mattock, MD 06/01/2024

## 2024-06-01 NOTE — Telephone Encounter (Signed)
 Scheduled appointments per 7/22 los. Talked with the patient and he is aware of the made appointments.

## 2024-06-01 NOTE — Assessment & Plan Note (Addendum)
 diagnosed 11/2019 by MRI for f/u of cirrhosis. S/p microwave ablation on 03/01/20. -MRI 06/01/22 showed suspicion for local recurrence and metastasis at L5. Biopsy of L5 confirmed metastatic HCC. S/p repeat microwave ablation on 07/17/22. -started lenvima  08/16/22 -staging CT CAP and bone scan on 09/19/22 showed: interval enlargement of right hepatic lobe HCC; new retropulsed bony fragment at L5; no other new lesions. His previous CT was in 05/2022 and he did not start Lenvima  until 07/2022, so it's hard to evaluate if he is responding to Lenvima  or not, the progression is likely the nature course of his [facility]  -Restaging CT scan from January 02, 2023 showed slightly improved previously treated liver lesion, and additional area of concern in the liver which are difficult to compare with previous scan due to different timing of contrast.  No other evidence of disease progression.  Plan to obtain MRI as next scan.  I personally reviewed the CT scan images and discussed the findings with patient -Will continue Lenvima , he is tolerating well overall. -Due to his weight being above 60 kg now, I increased his Lenvima  to 12 mg daily in Feb 2024 -repeated liver MRI on 04/23/2023 showed stable treated liver lesion, L5 bone mets has slightly increased in size.  Patient developed worsening low back pain in July 2024, repeated lumbar MRI showed significant disease progression in the L5 bone lesion.  I personally reviewed the scan images and discussed the findings with patient. -I recommend palliative radiation to his L5 lesion for pain control -I recommend changing systemic treatment to immunotherapy, I discussed option of atezolizumab and bevacizumab, durvalumab  and Imjudo , nivo and ipi etc. since he previously progressed on oral TKI, I recommend him to proceed with durvalumab  and Imjudo . He started on 06/30/23 -the goal of therapy is curative/palliative to prolong life and prevent/improve cancer related symptoms. -he underwent  Bilateral laminotomies L5-S1 with decompression of the spinal canal and the L5 nerve roots epidural tumor by Dr. Colon On 11/20/2023 -he received SBRT to low back in Feb 2025 -CT and abdominal MRI showed new enhancing lesion posteriorly in segment 7, suspicious for recurrence/metastasis, otherwise stable treated liver lesions and bone mets.  I discussed option of ablation versus SBRT radiation.  Patient prefers SBRT. -due to worsening LFTs, I stopped Durvalumab  in May 2025  -I referred him back to Dr. Patrcia and plan to change systemic therapy to carbozentinib when his LFT improves  -He started SBRT to new oligo liver met on 05/05/2024 and finished on 05/17/24 -He started ramping dose of cabozantinib on May 24, 2024.

## 2024-06-07 ENCOUNTER — Other Ambulatory Visit: Payer: Self-pay

## 2024-06-15 ENCOUNTER — Ambulatory Visit
Admission: RE | Admit: 2024-06-15 | Discharge: 2024-06-15 | Disposition: A | Discharge status: Home / Self Care | Source: Ambulatory Visit | Attending: Hematology | Admitting: Hematology

## 2024-06-15 NOTE — Progress Notes (Signed)
  Radiation Oncology         [phone]) [phone] ________________________________  Name: Corey Tarri Sierra Sr. MRN: [id number]  Date of Service: 06/15/2024  DOB: [birth year]  Post Treatment Telephone Note  Diagnosis: C22.0 Liver cell carcinoma Intent:     The patient was available for call today.   Symptoms of fatigue have not improved since completing therapy.  Symptoms of skin changes have not improved since completing therapy.  Symptoms of nausea or vomiting have not improved since completing therapy.   The patient has scheduled follow up with his medical oncologist Dr. Lanny for ongoing surveillance, and was encouraged to call if he  develops concerns or questions regarding radiation.

## 2024-06-17 ENCOUNTER — Other Ambulatory Visit: Payer: Self-pay | Admitting: Hematology

## 2024-06-22 ENCOUNTER — Inpatient Hospital Stay: Admitting: Nurse Practitioner

## 2024-06-22 ENCOUNTER — Inpatient Hospital Stay: Attending: Hematology

## 2024-06-22 ENCOUNTER — Telehealth: Payer: Self-pay

## 2024-06-22 ENCOUNTER — Inpatient Hospital Stay (HOSPITAL_BASED_OUTPATIENT_CLINIC_OR_DEPARTMENT_OTHER): Admitting: Hematology

## 2024-06-22 ENCOUNTER — Encounter: Payer: Self-pay | Admitting: Hematology

## 2024-06-22 VITALS — BP 134/62 | HR 83 | Temp 98.0°F | Resp 17 | Ht 68.5 in | Wt 131.7 lb

## 2024-06-22 DIAGNOSIS — R0602 Shortness of breath: Secondary | ICD-10-CM | POA: Insufficient documentation

## 2024-06-22 DIAGNOSIS — R911 Solitary pulmonary nodule: Secondary | ICD-10-CM | POA: Insufficient documentation

## 2024-06-22 DIAGNOSIS — K7460 Unspecified cirrhosis of liver: Secondary | ICD-10-CM | POA: Insufficient documentation

## 2024-06-22 DIAGNOSIS — I700 Atherosclerosis of aorta: Secondary | ICD-10-CM | POA: Diagnosis not present

## 2024-06-22 DIAGNOSIS — N281 Cyst of kidney, acquired: Secondary | ICD-10-CM | POA: Diagnosis not present

## 2024-06-22 DIAGNOSIS — I10 Essential (primary) hypertension: Secondary | ICD-10-CM | POA: Diagnosis not present

## 2024-06-22 DIAGNOSIS — R200 Anesthesia of skin: Secondary | ICD-10-CM | POA: Insufficient documentation

## 2024-06-22 DIAGNOSIS — R011 Cardiac murmur, unspecified: Secondary | ICD-10-CM | POA: Diagnosis not present

## 2024-06-22 DIAGNOSIS — I2510 Atherosclerotic heart disease of native coronary artery without angina pectoris: Secondary | ICD-10-CM | POA: Insufficient documentation

## 2024-06-22 DIAGNOSIS — Z79899 Other long term (current) drug therapy: Secondary | ICD-10-CM | POA: Insufficient documentation

## 2024-06-22 DIAGNOSIS — C7951 Secondary malignant neoplasm of bone: Secondary | ICD-10-CM | POA: Diagnosis not present

## 2024-06-22 DIAGNOSIS — G4700 Insomnia, unspecified: Secondary | ICD-10-CM | POA: Diagnosis not present

## 2024-06-22 DIAGNOSIS — R21 Rash and other nonspecific skin eruption: Secondary | ICD-10-CM | POA: Diagnosis not present

## 2024-06-22 DIAGNOSIS — D696 Thrombocytopenia, unspecified: Secondary | ICD-10-CM | POA: Insufficient documentation

## 2024-06-22 DIAGNOSIS — D72819 Decreased white blood cell count, unspecified: Secondary | ICD-10-CM | POA: Insufficient documentation

## 2024-06-22 DIAGNOSIS — Z7982 Long term (current) use of aspirin: Secondary | ICD-10-CM | POA: Diagnosis not present

## 2024-06-22 DIAGNOSIS — C220 Liver cell carcinoma: Secondary | ICD-10-CM | POA: Insufficient documentation

## 2024-06-22 LAB — CMP (CANCER CENTER ONLY)
ALT: 158 U/L — ABNORMAL HIGH (ref 0–44)
AST: 194 U/L (ref 15–41)
Albumin: 3.3 g/dL — ABNORMAL LOW (ref 3.5–5.0)
Alkaline Phosphatase: 681 U/L — ABNORMAL HIGH (ref 38–126)
Anion gap: 6 (ref 5–15)
BUN: 17 mg/dL (ref 8–23)
CO2: 21 mmol/L — ABNORMAL LOW (ref 22–32)
Calcium: 8.5 mg/dL — ABNORMAL LOW (ref 8.9–10.3)
Chloride: 113 mmol/L — ABNORMAL HIGH (ref 98–111)
Creatinine: 1.05 mg/dL (ref 0.61–1.24)
GFR, Estimated: >60 mL/min (ref >60)
Glucose, Bld: 180 mg/dL — ABNORMAL HIGH (ref 70–99)
Potassium: 3.3 mmol/L — ABNORMAL LOW (ref 3.5–5.1)
Sodium: 140 mmol/L (ref 135–145)
Total Bilirubin: 0.8 mg/dL (ref 0.0–1.2)
Total Protein: 6.4 g/dL — ABNORMAL LOW (ref 6.5–8.1)

## 2024-06-22 LAB — CBC WITH DIFFERENTIAL (CANCER CENTER ONLY)
Abs Immature Granulocytes: 0 K/uL (ref 0.00–0.07)
Basophils Absolute: 0 K/uL (ref 0.0–0.1)
Basophils Relative: 1 %
Eosinophils Absolute: 0.1 K/uL (ref 0.0–0.5)
Eosinophils Relative: 4 %
HCT: 38.3 % — ABNORMAL LOW (ref 39.0–52.0)
Hemoglobin: 12.7 g/dL — ABNORMAL LOW (ref 13.0–17.0)
Immature Granulocytes: 0 %
Lymphocytes Relative: 27 %
Lymphs Abs: 0.8 K/uL (ref 0.7–4.0)
MCH: 31.7 pg (ref 26.0–34.0)
MCHC: 33.2 g/dL (ref 30.0–36.0)
MCV: 95.5 fL (ref 80.0–100.0)
Monocytes Absolute: 0.2 K/uL (ref 0.1–1.0)
Monocytes Relative: 9 %
Neutro Abs: 1.7 K/uL (ref 1.7–7.7)
Neutrophils Relative %: 59 %
Platelet Count: 116 K/uL — ABNORMAL LOW (ref 150–400)
RBC: 4.01 MIL/uL — ABNORMAL LOW (ref 4.22–5.81)
RDW: 16.9 % — ABNORMAL HIGH (ref 11.5–15.5)
WBC Count: 2.8 K/uL — ABNORMAL LOW (ref 4.0–10.5)
nRBC: 0 % (ref 0.0–0.2)

## 2024-06-22 NOTE — Assessment & Plan Note (Signed)
 diagnosed 11/2019 by MRI for f/u of cirrhosis. S/p microwave ablation on 03/01/20. -MRI 06/01/22 showed suspicion for local recurrence and metastasis at L5. Biopsy of L5 confirmed metastatic HCC. S/p repeat microwave ablation on 07/17/22. -started lenvima  08/16/22 -staging CT CAP and bone scan on 09/19/22 showed: interval enlargement of right hepatic lobe HCC; new retropulsed bony fragment at L5; no other new lesions. His previous CT was in 05/2022 and he did not start Lenvima  until 07/2022, so it's hard to evaluate if he is responding to Lenvima  or not, the progression is likely the nature course of his [facility]  -Restaging CT scan from January 02, 2023 showed slightly improved previously treated liver lesion, and additional area of concern in the liver which are difficult to compare with previous scan due to different timing of contrast.  No other evidence of disease progression.  Plan to obtain MRI as next scan.  I personally reviewed the CT scan images and discussed the findings with patient -Will continue Lenvima , he is tolerating well overall. -Due to his weight being above 60 kg now, I increased his Lenvima  to 12 mg daily in Feb 2024 -repeated liver MRI on 04/23/2023 showed stable treated liver lesion, L5 bone mets has slightly increased in size.  Patient developed worsening low back pain in July 2024, repeated lumbar MRI showed significant disease progression in the L5 bone lesion.  I personally reviewed the scan images and discussed the findings with patient. -I recommend palliative radiation to his L5 lesion for pain control -I recommend changing systemic treatment to immunotherapy, I discussed option of atezolizumab and bevacizumab, durvalumab  and Imjudo , nivo and ipi etc. since he previously progressed on oral TKI, I recommend him to proceed with durvalumab  and Imjudo . He started on 06/30/23 -the goal of therapy is curative/palliative to prolong life and prevent/improve cancer related symptoms. -he underwent  Bilateral laminotomies L5-S1 with decompression of the spinal canal and the L5 nerve roots epidural tumor by Dr. Colon On 11/20/2023 -he received SBRT to low back in Feb 2025 -CT and abdominal MRI showed new enhancing lesion posteriorly in segment 7, suspicious for recurrence/metastasis, otherwise stable treated liver lesions and bone mets.  I discussed option of ablation versus SBRT radiation.  Patient prefers SBRT. -due to worsening LFTs, I stopped Durvalumab  in May 2025  -I referred him back to Dr. Patrcia and plan to change systemic therapy to carbozentinib when his LFT improves  -He started SBRT to new oligo liver met on 05/05/2024 and finished on 05/17/24 -He started ramping dose of cabozantinib on May 24, 2024.

## 2024-06-22 NOTE — Telephone Encounter (Signed)
 Attempted to contact the patient via telephone call, per Dr. Lanny. Unable to reach the patient. Left vmail @T [phone] stating AST result has worsened ~ this is probably related to his medication, per Dr. Lanny.  Stated to contact the office if he develops jaundice sx such as yellowing of the eyes or skin, per Dr. Lanny.

## 2024-06-22 NOTE — Progress Notes (Signed)
 [name]   Telephone:([phone] [phone]) [phone]   Clinic Follow up Note   Patient Care Team: [name], MD as PCP - General (Internal Medicine) [name], MD as PCP - Cardiology (Cardiology) [name], MD as Attending Physician (Hematology and Oncology) [name], [name], NP as Nurse Practitioner (Hospice and Palliative Medicine) Imaging, [facility] as Radiologist (Diagnostic Radiology)  Date of Service:  06/22/2024  CHIEF COMPLAINT: f/u of HCC  CURRENT THERAPY:  Cabozantinib 60 mg daily  Oncology History   Hepatocellular carcinoma in adult [facility]) diagnosed 11/2019 by MRI for f/u of cirrhosis. S/p microwave ablation on 03/01/20. -MRI 06/01/22 showed suspicion for local recurrence and metastasis at L5. Biopsy of L5 confirmed metastatic HCC. S/p repeat microwave ablation on 07/17/22. -started lenvima  08/16/22 -staging CT CAP and bone scan on 09/19/22 showed: interval enlargement of right hepatic lobe HCC; new retropulsed bony fragment at L5; no other new lesions. His previous CT was in 05/2022 and he did not start Lenvima  until 07/2022, so it's hard to evaluate if he is responding to Lenvima  or not, the progression is likely the nature course of his [facility]  -Restaging CT scan from January 02, 2023 showed slightly improved previously treated liver lesion, and additional area of concern in the liver which are difficult to compare with previous scan due to different timing of contrast.  No other evidence of disease progression.  Plan to obtain MRI as next scan.  I personally reviewed the CT scan images and discussed the findings with patient -Will continue Lenvima , he is tolerating well overall. -Due to his weight being above 60 kg now, I increased his Lenvima  to 12 mg daily in Feb 2024 -repeated liver MRI on 04/23/2023 showed stable treated liver lesion, L5 bone mets has slightly increased in size.  Patient developed worsening low  back pain in July 2024, repeated lumbar MRI showed significant disease progression in the L5 bone lesion.  I personally reviewed the scan images and discussed the findings with patient. -I recommend palliative radiation to his L5 lesion for pain control -I recommend changing systemic treatment to immunotherapy, I discussed option of atezolizumab and bevacizumab, durvalumab  and Imjudo , nivo and ipi etc. since he previously progressed on oral TKI, I recommend him to proceed with durvalumab  and Imjudo . He started on 06/30/23 -the goal of therapy is curative/palliative to prolong life and prevent/improve cancer related symptoms. -he underwent Bilateral laminotomies L5-S1 with decompression of the spinal canal and the L5 nerve roots epidural tumor by Dr. Colon On 11/20/2023 -he received SBRT to low back in Feb 2025 -CT and abdominal MRI showed new enhancing lesion posteriorly in segment 7, suspicious for recurrence/metastasis, otherwise stable treated liver lesions and bone mets.  I discussed option of ablation versus SBRT radiation.  Patient prefers SBRT. -due to worsening LFTs, I stopped Durvalumab  in May 2025  -I referred him back to Dr. Patrcia and plan to change systemic therapy to carbozentinib when his LFT improves  -He started SBRT to new oligo liver met on 05/05/2024 and finished on 05/17/24 -He started ramping dose of cabozantinib on May 24, 2024.  Assessment & Plan Hepatocellular carcinoma (HCC) HCC under treatment with cabozantinib, currently on 60 mg dose. Blood counts show mild leukopenia and thrombocytopenia, likely related to cabozantinib. No jaundice and normal urine color. Liver and kidney function tests pending. Shortness of breath at night, absent during daytime activities. - Continue cabozantinib 60 mg - Repeat scan after three months of treatment,  approximately mid-October - Schedule follow-up appointment in one month  Insomnia Difficulty sleeping for the past two nights. Possible  contributing factor is caffeine intake. Nocturnal shortness of breath noted, but no daytime respiratory issues. - Advise to avoid caffeine, especially in the afternoon and evening - Recommend over-the-counter Benadryl or melatonin for sleep  Rash of the hands, likely secondary to cabozantinib Rash on hands with white dots, not itchy but sensitive. - Recommend over-the-counter Voltaren gel for topical application on hands  Weight loss since June Weight decreased from 146 lbs in June to 131 lbs currently. Previous weight gain attributed to steroid use, which has been discontinued.  Plan - He is tolerating cabozantinib at full dose well, will continue 60 mg daily - Lab reviewed, mild leukopenia and thrombocytopenia, slightly worse transaminitis, possibly secondary to medication, will continue monitoring. - Lab and follow-up in a month   SUMMARY OF ONCOLOGIC HISTORY: Oncology History  Hepatocellular carcinoma in adult [facility])  12/08/2019 Imaging   MR ABDOMEN WWO CONTRAST   IMPRESSION: Two small masses in the right hepatic lobe which have characteristics diagnostic for hepatocellular carcinoma in the setting of cirrhosis. (LI-RADS Category 5: Definitely HCC)   No evidence of abdominal metastatic disease or other significant abnormality.   03/01/2020 Initial Diagnosis   Hepatocellular carcinoma (HCC)   03/01/2020 Procedure   CT GUIDE TISSUE ABLATION     IMPRESSION: Successful percutaneous thermal ablation.   Signed,   [name], MD, RPVI   06/02/2020 Imaging   MR ABDOMEN WWO CONTRAST   IMPRESSION: 1. There has been interval ablation at two adjacent sites of the subcapsular right lobe of the liver, hepatic segment VI. There is no residual contrast enhancement at these adjacent sites. LI-RADS category 5 TR, nonviable. 2. There are scattered, subcentimeter foci of arterial hyperenhancement of the anterior right lobe of the liver, for example adjacent to the ablation  site measuring 3 mm and in the medial anterior right lobe of the liver measuring 6 mm. This larger focus is unchanged compared to prior. Others were not previously seen. These findings are nonspecific and consistent with LI-RADS category 3. Attention on follow-up.   09/02/2020 Imaging   MR ABDOMEN WWO CONTRAST     IMPRESSION: 1. Post radiofrequency ablation along the margin of the RIGHT hemi liver. Heterogeneous appearance of this area on both T1 and T2. No sign of residual enhancement. LR TR nonviable. 2. Very subtle focus of restricted diffusion in the cephalad lateral segment LEFT hepatic lobe measuring 7 mm, corresponding to low signal on image 27 of series 21. Arterial enhancement is not seen in this area and this areas not seen on prior imaging. Not clear whether this represents a new lesion as this area is obscured largely by artifact on most sequences on today's study and on previous imaging evaluations. LR category 3, consider a 3 to six-month follow-up with abdominal MRI. 3. Lesions of concern scattered about the RIGHT hepatic lobe may represent perfusional anomalies as they were not seen on today's study. 4. Stable mild dilation of the biliary tree.   11/27/2020 Imaging   MR ABDOMEN WWO CONTRAST   IMPRESSION: Minimal decrease in size of ablation defect in the peripheral anterior right hepatic lobe. No evidence of locally recurrent hepatocellular carcinoma or other suspicious liver lesions.   No evidence of metastatic disease or other acute findings.   04/03/2021 Imaging   MR ABDOMEN WWO CONTRAST   IMPRESSION: Stable ablation defect in the lateral right hepatic lobe, without evidence  of recurrent tumor at this site.   5 mm hypovascular lesion in segment 5 of the right lobe was not definitely seen on previous study, but is too small to characterize. This shows no arterial phase hyperenhancement. Recommend continued follow-up by MRI in 6 months.   No evidence of  abdominal metastatic disease.        09/03/2021 Imaging   MR ABDOMEN WWO CONTRAST   IMPRESSION: 1. Side-by-side ablation sites in the right hepatic lobe in segment 6. The more anterior site remains largely cystic in appearance and no findings suspicious recurrent tumor in this area. There is however abnormal contrast enhancement around the larger more posterior and lateral ablation site worrisome for recurrent tumor. 2. Several small foci of early arterial phase enhancement in the liver. The peripheral lesions are likely vascular shunts. The more central lesions are likely dysplastic nodules. Recommend continued surveillance. 3. Stable bilateral renal cysts.   12/07/2021 Imaging   MR ABDOMEN WWO CONTRAST  IMPRESSION: 1. Ablation defects in the right lobe of the liver, similar to the prior study, without definitive evidence to suggest local recurrence of disease or metastatic disease. 2. Multiple tiny arterial phase areas of nodular hyperenhancement scattered throughout the liver, predominantly in the right lobe of the liver, without perceptible signal abnormality or diffusion restriction on other pulse sequences, nonspecific, and likely small benign areas of vascular shunting. Continued attention on follow-up imaging is recommended. 3. Multiple Bosniak class 1 and Bosniak class 2 cysts in the kidneys bilaterally, similar to the prior study, as above. 4. Aortic atherosclerosis.     06/01/2022 Imaging   MR ABDOMEN WWO CONTRAST   IMPRESSION: 1. LR TR equivocal but suspicious findings with respect to previously treated disease in the RIGHT hepatic lobe given nodular enhancement adjacent and anterior superior to the ablation zone as described. 2. Given lack of late arterial phase due to bolus timing a second area of abnormality within the liver is suspicious for additional site of hepatocellular carcinoma showing washout appearance and capsule appearance on current imaging,  cardiac pulsation limiting assessment in the area of concern. 3. Suspect bony metastatic disease in the lower lumbar spine at L5 level. Dedicated spinal imaging may be helpful for further evaluation.     06/18/2022 Imaging   MR Lumbar Spine W Wo Contrast   IMPRESSION: L5 vertebral body lesion is most consistent with a metastasis. There is probable trace ventral epidural extension   07/17/2022 Procedure   CT GUIDE TISSUE ABLATION   IMPRESSION: Successful CT-guided microwave ablation of recurrent hepatocellular carcinoma.   07/17/2022 Procedure   CT BONE TROCAR/NEEDLE BIOPSY SUPERFICIAL   IMPRESSION: Successful CT-guided core biopsy of L5 bone lesion.      07/17/2022 Pathology Results   SURGICAL PATHOLOGY  CASE: [id number]  PATIENT: ALPHONSO GREGSON  Surgical Pathology Report   SURGICAL PATHOLOGY  CASE: [id number]  PATIENT: ELLAREE SIERRA  Surgical Pathology Report      Clinical History: Hx of cirrhosis and small HCC with unexpected  suspicious lesion at L5. Rare HCC met to bone vs atypical hemangioma or  met from new primary. (crm)      FINAL MICROSCOPIC DIAGNOSIS:   A. 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 No acute findings within the chest. No suspicious findings for metastatic disease within the chest. 2. Unchanged 3 mm nodule within  the medial right upper lobe. No new or suspicious lung nodules. 3. Suboptimal visualization of the known liver lesions, reflecting lack of IV contrast material. Similar appearance of ablation defect within the lateral right lobe of liver. 4. Left renal calculi. 5. Coronary artery calcifications. 6.  Aortic Atherosclerosis   06/25/2023 Imaging   NM Bone Scan whole body  IMPRESSION: 1. Known L5 metastasis demonstrates low level uptake, mildly  increased from previous bone scan. 2. No new lesions demonstrated.   06/30/2023 - 03/10/2024 Chemotherapy   Patient is on Treatment Plan : Hepatocellular Carcinoma Tremelimumab -actl C1 D1 + Durvalumab  q28d       Imaging        Discussed the use of AI scribe software for clinical note transcription with the patient, who gave verbal consent to proceed.  History of Present Illness Corey Coe Sr. is a 68 year old male with hepatocellular carcinoma who presents for follow-up.  He is undergoing treatment with cabozantinib, recently increased to 60 mg, for hepatocellular carcinoma. He has developed a rash on his hands, described as white dots, which is sensitive but not itchy. His weight has decreased from 146 pounds in June to 131 pounds. Blood tests show a slightly low white blood cell and platelet count. Liver and kidney function tests are pending.  He experiences difficulty sleeping over the past two nights, potentially related to caffeine intake. He consumes caffeinated drinks in the morning due to his early work schedule but denies alcohol use. He works Naval architect for [employer].  He experiences shortness of breath at night when unable to sleep but denies any daytime symptoms or issues during physical activity. No back pain is reported.  His current medications include hydralazine , aspirin  81 mg, and Lipitor. He has discontinued prednisone  and furosemide .     All other systems were reviewed with the patient and are negative.  MEDICAL HISTORY:  Past Medical History:  Diagnosis Date   Headache    migraines   Hepatitis    HX OF hEP c ?    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    SURGICAL HISTORY: Past Surgical History:  Procedure Laterality Date   BREAST BIOPSY Left 03/26/2024   US  LT BREAST BX W LOC DEV 1ST LESION IMG BX SPEC US  GUIDE 03/26/2024 [facility] MAMMOGRAPHY   DECOMPRESSIVE LUMBAR LAMINECTOMY LEVEL 1 N/A  11/20/2023   Procedure: lumbar laminectomy right Lumbar five with decompression of nerve roots and excision of tumor;  Surgeon: Colon Shove, MD;  Location[facility] OR;  Service: Neurosurgery;  Laterality: N/A;   IR RADIOLOGIST EVAL & MGMT  01/25/2020   IR RADIOLOGIST EVAL & MGMT  03/21/2020   IR RADIOLOGIST EVAL & MGMT  06/07/2020   IR RADIOLOGIST EVAL & MGMT  09/05/2020   IR RADIOLOGIST EVAL & MGMT  12/05/2020   IR RADIOLOGIST EVAL & MGMT  05/09/2021   IR RADIOLOGIST EVAL & MGMT  09/06/2021   IR RADIOLOGIST EVAL & MGMT  12/12/2021   IR RADIOLOGIST EVAL & MGMT  06/25/2022   IR RADIOLOGIST EVAL & MGMT  07/24/2022   RADIOLOGY WITH ANESTHESIA N/A 03/01/2020   Procedure: CT WITH ANESTHESIA  THERMAL MICROWAVE  ABLATIION;  Surgeon: Karalee Beat, MD;  Location: [facility] ORS;  Service: Anesthesiology;  Laterality: N/A;   RADIOLOGY WITH ANESTHESIA N/A 07/17/2022   Procedure: CT MICROWAVE ABLATION;  Surgeon: Karalee Beat POUR, MD;  Location: [facility] ORS;  Service: Radiology;  Laterality: N/A;  I have reviewed the social history and family history with the patient and they are unchanged from previous note.  ALLERGIES:  has no known allergies.  MEDICATIONS:  Current Outpatient Medications  Medication Sig Dispense Refill   acetaminophen  (TYLENOL ) 500 MG tablet Take 1,000 mg by mouth every 6 (six) hours as needed.     aspirin  EC 81 MG tablet Take 1 tablet (81 mg total) by mouth daily. Swallow whole. 30 tablet 12   atorvastatin  (LIPITOR) 40 MG tablet Take 1 tablet (40 mg total) by mouth daily. 30 tablet 11   CABOMETYX  60 MG tablet TAKE 1 TABLET BY MOUTH 1 TIME A DAY ON AN EMPTY STOMACH, 1 HOUR BEFORE OR 2 HOURS AFTER MEALS. 30 tablet 0   hydrALAZINE  (APRESOLINE ) 10 MG tablet TAKE 1 TABLET 2 (TWO) TIMES DAILY AS NEEDED. TAKE FOR SYSTOLIC BLOOD PRESSURE MORE THAN 170. 180 tablet 3   olmesartan  (BENICAR ) 20 MG tablet Take 1 tablet (20 mg total) by mouth daily. 30 tablet 11   pantoprazole  (PROTONIX ) 20 MG tablet TAKE 1  TABLET (20 MG TOTAL) BY MOUTH DAILY. TAKE WHEN YOU ARE PREDNISONE  90 tablet 1   No current facility-administered medications for this visit.    PHYSICAL EXAMINATION: ECOG PERFORMANCE STATUS: 1 - Symptomatic but completely ambulatory  Vitals:   06/22/24 1414 06/22/24 1416  BP: (!) 147/86 134/62  Pulse: 83   Resp: 17   Temp: 98 F (36.7 C)   SpO2: 100%    Wt Readings from Last 3 Encounters:  06/22/24 131 lb 11.2 oz (59.7 kg)  06/01/24 133 lb 4.8 oz (60.5 kg)  05/10/24 146 lb (66.2 kg)     GENERAL:alert, no distress and comfortable SKIN: skin color, texture, turgor are normal, no rashes or significant lesions EYES: normal, Conjunctiva are pink and non-injected, sclera clear NECK: supple, thyroid  normal size, non-tender, without nodularity LYMPH:  no palpable lymphadenopathy in the cervical, axillary  LUNGS: clear to auscultation and percussion with normal breathing effort HEART: regular rate & rhythm and no murmurs and no lower extremity edema ABDOMEN:abdomen soft, non-tender and normal bowel sounds Musculoskeletal:no cyanosis of digits and no clubbing  NEURO: alert & oriented x 3 with fluent speech, no focal motor/sensory deficits  Physical Exam MEASUREMENTS: Weight- 131.  LABORATORY DATA:  I have reviewed the data as listed    Latest Ref Rng & Units 06/22/2024    2:02 PM 06/01/2024    9:11 AM 05/13/2024   12:22 PM  CBC  WBC 4.0 - 10.5 K/uL 2.8  4.2  5.8   Hemoglobin 13.0 - 17.0 g/dL 87.2  87.1  89.0   Hematocrit 39.0 - 52.0 % 38.3  39.8  34.2   Platelets 150 - 400 K/uL 116  164  135         Latest Ref Rng & Units 06/22/2024    2:02 PM 06/01/2024    9:11 AM 05/13/2024   12:22 PM  CMP  Glucose 70 - 99 mg/dL 819  875  83   BUN 8 - 23 mg/dL 17  16  20    Creatinine 0.61 - 1.24 mg/dL 8.94  9.03  8.98   Sodium 135 - 145 mmol/L 140  143  142   Potassium 3.5 - 5.1 mmol/L 3.3  4.4  4.2   Chloride 98 - 111 mmol/L 113  111  105   CO2 22 - 32 mmol/L 21  25  33   Calcium  8.9  - 10.3 mg/dL 8.5  9.4  9.5   Total Protein 6.5 - 8.1 g/dL 6.4  7.0  6.8   Total Bilirubin 0.0 - 1.2 mg/dL 0.8  0.4  0.9   Alkaline Phos 38 - 126 U/L 681  421  438   AST 15 - 41 U/L 194  84  48   ALT 0 - 44 U/L 158  106  67       RADIOGRAPHIC STUDIES: I have personally reviewed the radiological images as listed and agreed with the findings in the report. No results found.    No orders of the defined types were placed in this encounter.  All questions were answered. The patient knows to call the clinic with any problems, questions or concerns. No barriers to learning was detected. The total time spent in the appointment was 25 minutes, including review of chart and various tests results, discussions about plan of care and coordination of care plan     Onita Mattock, MD 06/22/2024

## 2024-06-22 NOTE — Progress Notes (Signed)
 CRITICAL VALUE STICKER  CRITICAL VALUE: AST [id number]  RECEIVER (on-site recipient of call): Rosina   DATE & TIME NOTIFIED: 06/22/24 @ 2:50   MESSENGER (representative from lab): Aldona   MD NOTIFIED: Dr. Lanny   TIME OF NOTIFICATION: 2:55  RESPONSE: Patient had appt with Dr. Lanny today.

## 2024-06-25 ENCOUNTER — Encounter: Payer: Self-pay | Admitting: Internal Medicine

## 2024-07-10 ENCOUNTER — Encounter (HOSPITAL_BASED_OUTPATIENT_CLINIC_OR_DEPARTMENT_OTHER): Payer: Self-pay

## 2024-07-10 ENCOUNTER — Encounter (HOSPITAL_BASED_OUTPATIENT_CLINIC_OR_DEPARTMENT_OTHER): Payer: Self-pay | Admitting: Cardiovascular Disease

## 2024-07-10 ENCOUNTER — Encounter (HOSPITAL_COMMUNITY): Payer: Self-pay

## 2024-07-10 ENCOUNTER — Encounter: Payer: Self-pay | Admitting: Hematology

## 2024-07-10 ENCOUNTER — Emergency Department (HOSPITAL_COMMUNITY)

## 2024-07-10 ENCOUNTER — Other Ambulatory Visit: Payer: Self-pay

## 2024-07-10 ENCOUNTER — Inpatient Hospital Stay (HOSPITAL_COMMUNITY)
Admission: EM | Admit: 2024-07-10 | Discharge: 2024-07-12 | DRG: 291 | Disposition: A | Discharge status: Home / Self Care | Attending: Internal Medicine | Admitting: Internal Medicine

## 2024-07-10 DIAGNOSIS — I5021 Acute systolic (congestive) heart failure: Secondary | ICD-10-CM | POA: Diagnosis present

## 2024-07-10 DIAGNOSIS — Z87891 Personal history of nicotine dependence: Secondary | ICD-10-CM

## 2024-07-10 DIAGNOSIS — D696 Thrombocytopenia, unspecified: Secondary | ICD-10-CM | POA: Diagnosis present

## 2024-07-10 DIAGNOSIS — C220 Liver cell carcinoma: Secondary | ICD-10-CM | POA: Diagnosis present

## 2024-07-10 DIAGNOSIS — I110 Hypertensive heart disease with heart failure: Secondary | ICD-10-CM | POA: Diagnosis present

## 2024-07-10 DIAGNOSIS — E871 Hypo-osmolality and hyponatremia: Secondary | ICD-10-CM | POA: Diagnosis present

## 2024-07-10 DIAGNOSIS — C7951 Secondary malignant neoplasm of bone: Secondary | ICD-10-CM | POA: Diagnosis present

## 2024-07-10 DIAGNOSIS — E876 Hypokalemia: Secondary | ICD-10-CM | POA: Diagnosis not present

## 2024-07-10 DIAGNOSIS — K219 Gastro-esophageal reflux disease without esophagitis: Secondary | ICD-10-CM | POA: Diagnosis present

## 2024-07-10 DIAGNOSIS — Z1152 Encounter for screening for COVID-19: Secondary | ICD-10-CM

## 2024-07-10 DIAGNOSIS — Z7982 Long term (current) use of aspirin: Secondary | ICD-10-CM | POA: Diagnosis not present

## 2024-07-10 DIAGNOSIS — E785 Hyperlipidemia, unspecified: Secondary | ICD-10-CM | POA: Diagnosis present

## 2024-07-10 DIAGNOSIS — I509 Heart failure, unspecified: Principal | ICD-10-CM

## 2024-07-10 DIAGNOSIS — Z79899 Other long term (current) drug therapy: Secondary | ICD-10-CM

## 2024-07-10 DIAGNOSIS — I350 Nonrheumatic aortic (valve) stenosis: Secondary | ICD-10-CM | POA: Diagnosis not present

## 2024-07-10 DIAGNOSIS — I352 Nonrheumatic aortic (valve) stenosis with insufficiency: Secondary | ICD-10-CM | POA: Diagnosis present

## 2024-07-10 LAB — COMPREHENSIVE METABOLIC PANEL WITH GFR
ALT: 146 U/L — ABNORMAL HIGH (ref 0–44)
AST: 206 U/L — ABNORMAL HIGH (ref 15–41)
Albumin: 3.4 g/dL — ABNORMAL LOW (ref 3.5–5.0)
Alkaline Phosphatase: 889 U/L — ABNORMAL HIGH (ref 38–126)
Anion gap: 12 (ref 5–15)
BUN: 12 mg/dL (ref 8–23)
CO2: 20 mmol/L — ABNORMAL LOW (ref 22–32)
Calcium: 8.9 mg/dL (ref 8.9–10.3)
Chloride: 113 mmol/L — ABNORMAL HIGH (ref 98–111)
Creatinine, Ser: 1.13 mg/dL (ref 0.61–1.24)
GFR, Estimated: >60 mL/min (ref >60)
Glucose, Bld: 97 mg/dL (ref 70–99)
Potassium: 3 mmol/L — ABNORMAL LOW (ref 3.5–5.1)
Sodium: 145 mmol/L (ref 135–145)
Total Bilirubin: 1.1 mg/dL (ref 0.0–1.2)
Total Protein: 6.7 g/dL (ref 6.5–8.1)

## 2024-07-10 LAB — RESP PANEL BY RT-PCR (RSV, FLU A&B, COVID)  RVPGX2
Influenza A by PCR: NEGATIVE
Influenza B by PCR: NEGATIVE
Resp Syncytial Virus by PCR: NEGATIVE
SARS Coronavirus 2 by RT PCR: NEGATIVE

## 2024-07-10 LAB — I-STAT CHEM 8, ED
BUN: 13 mg/dL (ref 8–23)
Calcium, Ion: 1.15 mmol/L (ref 1.15–1.40)
Chloride: 112 mmol/L — ABNORMAL HIGH (ref 98–111)
Creatinine, Ser: 1.2 mg/dL (ref 0.61–1.24)
Glucose, Bld: 104 mg/dL — ABNORMAL HIGH (ref 70–99)
HCT: 41 % (ref 39.0–52.0)
Hemoglobin: 13.9 g/dL (ref 13.0–17.0)
Potassium: 2.9 mmol/L — ABNORMAL LOW (ref 3.5–5.1)
Sodium: 147 mmol/L — ABNORMAL HIGH (ref 135–145)
TCO2: 20 mmol/L — ABNORMAL LOW (ref 22–32)

## 2024-07-10 LAB — CBC WITH DIFFERENTIAL/PLATELET
Abs Immature Granulocytes: 0.01 K/uL (ref 0.00–0.07)
Basophils Absolute: 0 K/uL (ref 0.0–0.1)
Basophils Relative: 1 %
Eosinophils Absolute: 0.1 K/uL (ref 0.0–0.5)
Eosinophils Relative: 2 %
HCT: 41.3 % (ref 39.0–52.0)
Hemoglobin: 13.6 g/dL (ref 13.0–17.0)
Immature Granulocytes: 0 %
Lymphocytes Relative: 26 %
Lymphs Abs: 0.8 K/uL (ref 0.7–4.0)
MCH: 32 pg (ref 26.0–34.0)
MCHC: 32.9 g/dL (ref 30.0–36.0)
MCV: 97.2 fL (ref 80.0–100.0)
Monocytes Absolute: 0.5 K/uL (ref 0.1–1.0)
Monocytes Relative: 15 %
Neutro Abs: 1.8 K/uL (ref 1.7–7.7)
Neutrophils Relative %: 56 %
Platelets: 148 K/uL — ABNORMAL LOW (ref 150–400)
RBC: 4.25 MIL/uL (ref 4.22–5.81)
RDW: 17.9 % — ABNORMAL HIGH (ref 11.5–15.5)
WBC: 3.2 K/uL — ABNORMAL LOW (ref 4.0–10.5)
nRBC: 0 % (ref 0.0–0.2)

## 2024-07-10 LAB — TROPONIN T, HIGH SENSITIVITY
Troponin T High Sensitivity: 67 ng/L — ABNORMAL HIGH (ref 0–19)
Troponin T High Sensitivity: 68 ng/L — ABNORMAL HIGH (ref 0–19)

## 2024-07-10 LAB — PRO BRAIN NATRIURETIC PEPTIDE: Pro Brain Natriuretic Peptide: 7746 pg/mL — ABNORMAL HIGH (ref <300.0)

## 2024-07-10 MED ORDER — FUROSEMIDE 10 MG/ML IJ SOLN
20.0000 mg | Freq: Once | INTRAMUSCULAR | Status: AC
  Administered 2024-07-10: 20 mg via INTRAVENOUS
  Filled 2024-07-10: qty 4

## 2024-07-10 MED ORDER — ALBUTEROL SULFATE (2.5 MG/3ML) 0.083% IN NEBU: 2.5000 mg | INHALATION_SOLUTION | RESPIRATORY_TRACT | Status: DC | PRN

## 2024-07-10 MED ORDER — ENOXAPARIN SODIUM 40 MG/0.4ML IJ SOSY
40.0000 mg | PREFILLED_SYRINGE | INTRAMUSCULAR | Status: DC
  Administered 2024-07-10 – 2024-07-11 (×2): 40 mg via SUBCUTANEOUS
  Filled 2024-07-10 (×2): qty 0.4

## 2024-07-10 MED ORDER — ACETAMINOPHEN 325 MG PO TABS: 650.0000 mg | ORAL_TABLET | Freq: Four times a day (QID) | ORAL | Status: DC | PRN

## 2024-07-10 MED ORDER — TRAZODONE HCL 50 MG PO TABS: 25.0000 mg | ORAL_TABLET | Freq: Every evening | ORAL | Status: DC | PRN

## 2024-07-10 MED ORDER — ONDANSETRON HCL 4 MG/2ML IJ SOLN: 4.0000 mg | Freq: Four times a day (QID) | INTRAMUSCULAR | Status: DC | PRN

## 2024-07-10 MED ORDER — ONDANSETRON HCL 4 MG PO TABS: 4.0000 mg | ORAL_TABLET | Freq: Four times a day (QID) | ORAL | Status: DC | PRN

## 2024-07-10 MED ORDER — ACETAMINOPHEN 650 MG RE SUPP: 650.0000 mg | Freq: Four times a day (QID) | RECTAL | Status: DC | PRN

## 2024-07-10 MED ORDER — FUROSEMIDE 10 MG/ML IJ SOLN
20.0000 mg | Freq: Two times a day (BID) | INTRAMUSCULAR | Status: DC
  Administered 2024-07-10 – 2024-07-11 (×3): 20 mg via INTRAVENOUS
  Filled 2024-07-10 (×3): qty 2

## 2024-07-10 MED ORDER — IOHEXOL 350 MG/ML SOLN
75.0000 mL | Freq: Once | INTRAVENOUS | Status: AC | PRN
  Administered 2024-07-10: 75 mL via INTRAVENOUS

## 2024-07-10 MED ORDER — POTASSIUM CHLORIDE CRYS ER 20 MEQ PO TBCR
40.0000 meq | EXTENDED_RELEASE_TABLET | Freq: Once | ORAL | Status: AC
Start: 2024-07-10 — End: 2024-07-10
  Administered 2024-07-10: 40 meq via ORAL
  Filled 2024-07-10: qty 2

## 2024-07-10 NOTE — Plan of Care (Signed)
  Problem: Education: Goal: Knowledge of General Education information will improve Description: Including pain rating scale, medication(s)/side effects and non-pharmacologic comfort measures Outcome: Progressing   Problem: Clinical Measurements: Goal: Ability to maintain clinical measurements within normal limits will improve Outcome: Progressing Goal: Diagnostic test results will improve Outcome: Progressing   Problem: Activity: Goal: Risk for activity intolerance will decrease Outcome: Progressing   Problem: Nutrition: Goal: Adequate nutrition will be maintained Outcome: Progressing   Problem: Safety: Goal: Ability to remain free from injury will improve Outcome: Progressing

## 2024-07-10 NOTE — H&P (Addendum)
 History and Physical  Corey Ghee Sr. FMW:[id number] DOB: 1956-07-27 DOA: 07/10/2024  PCP: [name], MD   Chief Complaint: Shortness of breath  HPI: Corey Tarri Sierra Sr. is a 68 y.o. male with medical history significant for HTN, hepatocellular carcinoma on oral chemotherapy presented to the hospital with dyspnea on exertion, orthopnea and wheezing found to have evidence of new onset acute congestive heart failure.  Patient is here with his wife who also helps provide history, they state that he was doing well until about a week or so ago when he started to notice shortness of breath, he feels like it has been essentially stable since it started, he denies any chest pain, significant cough, fever, lower extremity edema or other complaints.  He started to experience his shortness of breath, his wife noted that at night especially when he is lying down, he would get more short of breath, sounded like he was wheezing and rattling in his chest.  He came to the ER for evaluation, and workup as noted below is concerning for acute heart failure.  He was given a dose of IV Lasix  just now, has not started urinating yet.  Review of Systems: Please see HPI for pertinent positives and negatives. A complete 10 system review of systems are otherwise negative.  Past Medical History:  Diagnosis Date   Headache    migraines   Hepatitis    HX OF hEP c ?    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   Past Surgical History:  Procedure Laterality Date   BREAST BIOPSY Left 03/26/2024   US  LT BREAST BX W LOC DEV 1ST LESION IMG BX SPEC US  GUIDE 03/26/2024 [facility] MAMMOGRAPHY   DECOMPRESSIVE LUMBAR LAMINECTOMY LEVEL 1 N/A 11/20/2023   Procedure: lumbar laminectomy right Lumbar five with decompression of nerve roots and excision of tumor;  Surgeon: Colon Shove, MD;  Location: [facility] OR;  Service: Neurosurgery;  Laterality: N/A;   IR RADIOLOGIST EVAL & MGMT   01/25/2020   IR RADIOLOGIST EVAL & MGMT  03/21/2020   IR RADIOLOGIST EVAL & MGMT  06/07/2020   IR RADIOLOGIST EVAL & MGMT  09/05/2020   IR RADIOLOGIST EVAL & MGMT  12/05/2020   IR RADIOLOGIST EVAL & MGMT  05/09/2021   IR RADIOLOGIST EVAL & MGMT  09/06/2021   IR RADIOLOGIST EVAL & MGMT  12/12/2021   IR RADIOLOGIST EVAL & MGMT  06/25/2022   IR RADIOLOGIST EVAL & MGMT  07/24/2022   RADIOLOGY WITH ANESTHESIA N/A 03/01/2020   Procedure: CT WITH ANESTHESIA  THERMAL MICROWAVE  ABLATIION;  Surgeon: Karalee Beat, MD;  Location: [facility] ORS;  Service: Anesthesiology;  Laterality: N/A;   RADIOLOGY WITH ANESTHESIA N/A 07/17/2022   Procedure: CT MICROWAVE ABLATION;  Surgeon: Karalee Beat POUR, MD;  Location: [facility] ORS;  Service: Radiology;  Laterality: N/A;   Social History:  reports that he has quit smoking. He has been exposed to tobacco smoke. He has never used smokeless tobacco. He reports that he does not currently use alcohol. He reports that he does not use drugs.  No Known Allergies  Family History  Problem Relation Age of Onset   Arthritis Mother    Cancer Neg Hx    Diabetes Neg Hx    Drug abuse Neg Hx    Heart disease Neg Hx    Hyperlipidemia Neg Hx    Hypertension Neg Hx    Kidney disease Neg Hx  Stroke Neg Hx      Prior to Admission medications   Medication Sig Start Date End Date Taking? Authorizing Provider  acetaminophen  (TYLENOL ) 500 MG tablet Take 1,000 mg by mouth every 6 (six) hours as needed.    [provider]  aspirin  EC 81 MG tablet Take 1 tablet (81 mg total) by mouth daily. Swallow whole. 12/10/23   Raenelle Coria, MD  atorvastatin  (LIPITOR) 40 MG tablet Take 1 tablet (40 mg total) by mouth daily. 12/09/23 12/08/24  Ghimire, Kuber, MD  CABOMETYX  60 MG tablet TAKE 1 TABLET BY MOUTH 1 TIME A DAY ON AN EMPTY STOMACH, 1 HOUR BEFORE OR 2 HOURS AFTER MEALS. 06/17/24   [name], MD  hydrALAZINE  (APRESOLINE ) 10 MG tablet TAKE 1 TABLET 2 (TWO) TIMES DAILY AS NEEDED. TAKE FOR  SYSTOLIC BLOOD PRESSURE MORE THAN 170. 02/17/24   Walker, Caitlin S, NP  olmesartan  (BENICAR ) 20 MG tablet Take 1 tablet (20 mg total) by mouth daily. 01/01/24   Walker, Caitlin S, NP  pantoprazole  (PROTONIX ) 20 MG tablet TAKE 1 TABLET (20 MG TOTAL) BY MOUTH DAILY. TAKE WHEN YOU ARE PREDNISONE  05/09/24   [name], MD    Physical Exam: BP (!) 143/80 (BP Location: Left Arm)   Pulse 96   Temp (!) 97.3 F (36.3 C) (Oral)   Resp (!) 21   Ht 5' 8.5 (1.74 m)   Wt 59.4 kg   SpO2 95%   BMI 19.63 kg/m  General:  Alert, oriented, calm, in no acute distress, resting comfortably on room air, mild dyspnea.  Overall looks euvolemic. Cardiovascular: RRR, no murmurs or rubs, no peripheral edema  Respiratory: clear to auscultation bilaterally, no active wheezing or crackles, breath sounds are diminished at the bilateral bases.  He has some mild tachypnea, though he is able to speak in full sentences. Abdomen: soft, nontender, nondistended, normal bowel tones heard  Skin: dry, no rashes  Musculoskeletal: no joint effusions, normal range of motion  Psychiatric: appropriate affect, normal speech  Neurologic: extraocular muscles intact, clear speech, moving all extremities with intact sensorium         Labs on Admission:  Basic Metabolic Panel: Recent Labs  Lab 07/10/24 1026 07/10/24 1131  NA 145 147*  K 3.0* 2.9*  CL 113* 112*  CO2 20*  --   GLUCOSE 97 104*  BUN 12 13  CREATININE 1.13 1.20  CALCIUM  8.9  --    Liver Function Tests: Recent Labs  Lab 07/10/24 1026  AST 206*  ALT 146*  ALKPHOS 889*  BILITOT 1.1  PROT 6.7  ALBUMIN 3.4*   No results for input(s): LIPASE, AMYLASE in the last 168 hours. No results for input(s): AMMONIA in the last 168 hours. CBC: Recent Labs  Lab 07/10/24 1026 07/10/24 1131  WBC 3.2*  --   NEUTROABS 1.8  --   HGB 13.6 13.9  HCT 41.3 41.0  MCV 97.2  --   PLT 148*  --    Cardiac Enzymes: No results for input(s): CKTOTAL, CKMB, CKMBINDEX,  TROPONINI in the last 168 hours. BNP (last 3 results) No results for input(s): BNP in the last 8760 hours.  ProBNP (last 3 results) Recent Labs    07/10/24 1040  PROBNP 7,746.0*    CBG: No results for input(s): GLUCAP in the last 168 hours.  Radiological Exams on Admission: CT Angio Chest PE W/Cm &/Or Wo Cm Result Date: 07/10/2024 EXAM: CTA CHEST 07/10/2024 11:54:20 AM TECHNIQUE: CTA of the chest was performed after  the administration of intravenous contrast. Multiplanar reformatted images are provided for review. MIP images are provided for review. Automated exposure control, iterative reconstruction, and/or weight based adjustment of the mA/kV was utilized to reduce the radiation dose to as low as reasonably achievable. COMPARISON: None available. CLINICAL HISTORY: Pulmonary embolism (PE) suspected, high prob. Per triage notes: Patient reports sob x 1 week. Worse with lying down. Complains of wheezing. Hx of hepatocellular carcinoma. No leg swelling. Denies CP. Family reports his color is off. Patient is labored and tachypneic in triage. FINDINGS: PULMONARY ARTERIES: Pulmonary arteries are adequately opacified for evaluation. No signs of acute pulmonary embolus to the distal segmental level. Early timing of the contrast bolus noted. MEDIASTINUM: The heart size is enlarged. No pericardial effusion. Aortic atherosclerosis. Coronary artery calcifications. LYMPH NODES: No enlarged mediastinal or hilar lymph nodes. LUNGS AND PLEURA: Bilateral pleural effusions: moderate on the right and small on the left. Interlobular septal thickening identified with diffuse ground-glass attenuation compatible with pulmonary edema. Mild emphysema with diffuse bronchial wall thickening. Subsegmental atelectasis noted within the lingula and right middle lobe. UPPER ABDOMEN: Low attenuation lesion within the periphery of the right lobe of liver is again noted on axial image 167/5 corresponding to previously  characterized suspicious for recurrent hepatoma or metastatic disease. No acute findings within the imaged portions of the upper abdomen. Bilateral renal calcifications. SOFT TISSUES AND BONES: Left-sided gynecomastia. IMPRESSION: 1. No evidence of pulmonary embolism to the distal segmental level. Early timing of the contrast bolus noted. 2. Findings compatible with pulmonary edema, including interlobular septal thickening and diffuse ground-glass attenuation. 3. Moderate right and small left pleural effusions. 4. Enlarged heart size with aortic atherosclerosis and coronary artery calcifications. No pericardial effusion. 5. Low attenuation lesion within the periphery of the right lobe of liver, suspicious for recurrent hepatoma or metastatic disease. Electronically signed by: Waddell Calk MD 07/10/2024 12:16 PM EDT RP Workstation: [id number]   DG Chest 2 View Result Date: 07/10/2024 CLINICAL DATA:  Shortness of breath for the past week. EXAM: CHEST - 2 VIEW COMPARISON:  12/08/2023 FINDINGS: Normal-sized heart with a mild increase in size. Interval diffuse prominence of the pulmonary vasculature and slight interstitial prominence with Kerley lines. Interval small left pleural effusion and patchy airspace opacity in the right lower lobe. Mild left lower lobe atelectasis. Mild peribronchial thickening. Unremarkable bones. IMPRESSION: 1. Interval patchy airspace opacity in the right lower lobe, suspicious for pneumonia. 2. Interval mild congestive heart failure. 3. Interval small left pleural effusion and mild left lower lobe atelectasis. Electronically Signed   By: Elspeth Bathe M.D.   On: 07/10/2024 11:23   Assessment/Plan Corey Tarri Sierra Sr. is a 68 y.o. male with medical history significant for HTN, hepatocellular carcinoma on oral chemotherapy presented to the hospital with dyspnea on exertion, orthopnea and wheezing found to have evidence of new onset acute congestive heart failure.  New onset  congestive heart failure-suspected due to dyspnea, orthopnea, elevated BNP and pulmonary edema seen on CT of the chest.  Notably, he had an unremarkable echo done January 2025 when he was admitted for TIA workup. -Inpatient admission -Telemetry monitoring -Heart healthy diet with fluid restriction -Check 2D echo -IV Lasix  20 mg twice daily  Hyperlipidemia-Lipitor  Hypertension-resume home ARB once reconciled  GERD-Protonix   Hepatocellular carcinoma-hold chemotherapy in house, Dr. Lanny added to treatment team  DVT prophylaxis: Lovenox      Code Status: Full Code  Consults called: None  Admission status: The appropriate patient status for this patient is  INPATIENT. Inpatient status is judged to be reasonable and necessary in order to provide the required intensity of service to ensure the patient's safety. The patient's presenting symptoms, physical exam findings, and initial radiographic and laboratory data in the context of their chronic comorbidities is felt to place them at high risk for further clinical deterioration. Furthermore, it is not anticipated that the patient will be medically stable for discharge from the hospital within 2 midnights of admission.    I certify that at the point of admission it is my clinical judgment that the patient will require inpatient hospital care spanning beyond 2 midnights from the point of admission due to high intensity of service, high risk for further deterioration and high frequency of surveillance required  Time spent: 53 minutes  Bailynn Dyk CHRISTELLA Gail MD [facility] Pager [phone]  If 7PM-7AM, please contact night-coverage [email or web address] Password [email or web address]  07/10/2024, 1:08 PM

## 2024-07-10 NOTE — ED Triage Notes (Signed)
 Patient reports sob x 1 week.  Worse with lying down.  Complains of wheezing.  Hx of hepatocellular carcinoma.  No leg swelling.  Denies CP.  Family reports his color is off.  Patient is labored and tachypneic in triage.

## 2024-07-10 NOTE — ED Provider Notes (Signed)
 [place] EMERGENCY DEPARTMENT AT [facility] Provider Note   CSN: [id number] Arrival date & time: 07/10/24  1015     Patient presents with: Shortness of Breath   Corey Tarri Sierra Sr. is a 69 y.o. male with PMHx hepatocellular cancer receiving radiation therapy, headaches, HTN, who presents to ED concerned for SOB developing over the past 1 week. Patient endorses orthopnea but denies leg swelling or weight gain. Family member endorsing wheezing last night - patient denies coughing or fevers. Denies fever, chest pain, vomiting, dysuria, hematuria.  Last dose of radiation therapy was around 1 month ago.     Shortness of Breath      Prior to Admission medications   Medication Sig Start Date End Date Taking? Authorizing Provider  acetaminophen  (TYLENOL ) 500 MG tablet Take 1,000 mg by mouth every 6 (six) hours as needed.    [provider]  aspirin  EC 81 MG tablet Take 1 tablet (81 mg total) by mouth daily. Swallow whole. 12/10/23   Raenelle Coria, MD  atorvastatin  (LIPITOR) 40 MG tablet Take 1 tablet (40 mg total) by mouth daily. 12/09/23 12/08/24  Raenelle Coria, MD  CABOMETYX  60 MG tablet TAKE 1 TABLET BY MOUTH 1 TIME A DAY ON AN EMPTY STOMACH, 1 HOUR BEFORE OR 2 HOURS AFTER MEALS. 06/17/24   [name], MD  hydrALAZINE  (APRESOLINE ) 10 MG tablet TAKE 1 TABLET 2 (TWO) TIMES DAILY AS NEEDED. TAKE FOR SYSTOLIC BLOOD PRESSURE MORE THAN 170. 02/17/24   Walker, Caitlin S, NP  olmesartan  (BENICAR ) 20 MG tablet Take 1 tablet (20 mg total) by mouth daily. 01/01/24   Walker, Caitlin S, NP  pantoprazole  (PROTONIX ) 20 MG tablet TAKE 1 TABLET (20 MG TOTAL) BY MOUTH DAILY. TAKE WHEN YOU ARE PREDNISONE  05/09/24   [name], MD    Allergies: Patient has no known allergies.    Review of Systems  Respiratory:  Positive for shortness of breath.     Updated Vital Signs BP (!) 143/80 (BP Location: Left Arm)   Pulse 96   Temp (!) 97.3 F (36.3 C) (Oral)   Resp (!) 21   Ht 5'  8.5 (1.74 m)   Wt 59.4 kg   SpO2 95%   BMI 19.63 kg/m   Physical Exam Vitals and nursing note reviewed.  Constitutional:      General: He is not in acute distress.    Appearance: He is not ill-appearing or toxic-appearing.  HENT:     Head: Normocephalic and atraumatic.     Mouth/Throat:     Mouth: Mucous membranes are moist.     Pharynx: No posterior oropharyngeal erythema.  Eyes:     General: No scleral icterus.       Right eye: No discharge.        Left eye: No discharge.     Conjunctiva/sclera: Conjunctivae normal.  Cardiovascular:     Rate and Rhythm: Normal rate and regular rhythm.     Pulses: Normal pulses.     Heart sounds: No murmur heard. Pulmonary:     Effort: Pulmonary effort is normal. No respiratory distress.     Breath sounds: Normal breath sounds. No wheezing, rhonchi or rales.  Abdominal:     General: Bowel sounds are normal.     Palpations: Abdomen is soft. There is no mass.     Tenderness: There is no abdominal tenderness.  Musculoskeletal:     Right lower leg: No edema.     Left lower leg: No edema.  Skin:    General: Skin is warm and dry.     Findings: No rash.  Neurological:     General: No focal deficit present.     Mental Status: He is alert and oriented to person, place, and time. Mental status is at baseline.  Psychiatric:        Mood and Affect: Mood normal.        Behavior: Behavior normal.     (all labs ordered are listed, but only abnormal results are displayed) Labs Reviewed  CBC WITH DIFFERENTIAL/PLATELET - Abnormal; Notable for the following components:      Result Value   WBC 3.2 (*)    RDW 17.9 (*)    Platelets 148 (*)    All other components within normal limits  COMPREHENSIVE METABOLIC PANEL WITH GFR - Abnormal; Notable for the following components:   Potassium 3.0 (*)    Chloride 113 (*)    CO2 20 (*)    Albumin 3.4 (*)    AST 206 (*)    ALT 146 (*)    Alkaline Phosphatase 889 (*)    All other components within  normal limits  PRO BRAIN NATRIURETIC PEPTIDE - Abnormal; Notable for the following components:   Pro Brain Natriuretic Peptide 7,746.0 (*)    All other components within normal limits  I-STAT CHEM 8, ED - Abnormal; Notable for the following components:   Sodium 147 (*)    Potassium 2.9 (*)    Chloride 112 (*)    Glucose, Bld 104 (*)    TCO2 20 (*)    All other components within normal limits  TROPONIN T, HIGH SENSITIVITY - Abnormal; Notable for the following components:   Troponin T High Sensitivity 67 (*)    All other components within normal limits  RESP PANEL BY RT-PCR (RSV, FLU A&B, COVID)  RVPGX2  TROPONIN T, HIGH SENSITIVITY    EKG: EKG Interpretation Date/Time:  Saturday July 10 2024 10:41:30 EDT Ventricular Rate:  83 PR Interval:  144 QRS Duration:  93 QT Interval:  456 QTC Calculation: 536 R Axis:   59  Text Interpretation: Sinus rhythm LVH with secondary repolarization abnormality Anterior ST elevation, probably due to LVH Prolonged QT interval Non-specific ST-t changes Baseline wander Confirmed by Bernard Drivers ([id number]) on 07/10/2024 12:07:47 PM  Radiology: CT Angio Chest PE W/Cm &/Or Wo Cm Result Date: 07/10/2024 EXAM: CTA CHEST 07/10/2024 11:54:20 AM TECHNIQUE: CTA of the chest was performed after the administration of intravenous contrast. Multiplanar reformatted images are provided for review. MIP images are provided for review. Automated exposure control, iterative reconstruction, and/or weight based adjustment of the mA/kV was utilized to reduce the radiation dose to as low as reasonably achievable. COMPARISON: None available. CLINICAL HISTORY: Pulmonary embolism (PE) suspected, high prob. Per triage notes: Patient reports sob x 1 week. Worse with lying down. Complains of wheezing. Hx of hepatocellular carcinoma. No leg swelling. Denies CP. Family reports his color is off. Patient is labored and tachypneic in triage. FINDINGS: PULMONARY ARTERIES: Pulmonary arteries  are adequately opacified for evaluation. No signs of acute pulmonary embolus to the distal segmental level. Early timing of the contrast bolus noted. MEDIASTINUM: The heart size is enlarged. No pericardial effusion. Aortic atherosclerosis. Coronary artery calcifications. LYMPH NODES: No enlarged mediastinal or hilar lymph nodes. LUNGS AND PLEURA: Bilateral pleural effusions: moderate on the right and small on the left. Interlobular septal thickening identified with diffuse ground-glass attenuation compatible with pulmonary edema. Mild emphysema with diffuse bronchial wall  thickening. Subsegmental atelectasis noted within the lingula and right middle lobe. UPPER ABDOMEN: Low attenuation lesion within the periphery of the right lobe of liver is again noted on axial image 167/5 corresponding to previously characterized suspicious for recurrent hepatoma or metastatic disease. No acute findings within the imaged portions of the upper abdomen. Bilateral renal calcifications. SOFT TISSUES AND BONES: Left-sided gynecomastia. IMPRESSION: 1. No evidence of pulmonary embolism to the distal segmental level. Early timing of the contrast bolus noted. 2. Findings compatible with pulmonary edema, including interlobular septal thickening and diffuse ground-glass attenuation. 3. Moderate right and small left pleural effusions. 4. Enlarged heart size with aortic atherosclerosis and coronary artery calcifications. No pericardial effusion. 5. Low attenuation lesion within the periphery of the right lobe of liver, suspicious for recurrent hepatoma or metastatic disease. Electronically signed by: Waddell Calk MD 07/10/2024 12:16 PM EDT RP Workstation: [id number]   DG Chest 2 View Result Date: 07/10/2024 CLINICAL DATA:  Shortness of breath for the past week. EXAM: CHEST - 2 VIEW COMPARISON:  12/08/2023 FINDINGS: Normal-sized heart with a mild increase in size. Interval diffuse prominence of the pulmonary vasculature and slight  interstitial prominence with Kerley lines. Interval small left pleural effusion and patchy airspace opacity in the right lower lobe. Mild left lower lobe atelectasis. Mild peribronchial thickening. Unremarkable bones. IMPRESSION: 1. Interval patchy airspace opacity in the right lower lobe, suspicious for pneumonia. 2. Interval mild congestive heart failure. 3. Interval small left pleural effusion and mild left lower lobe atelectasis. Electronically Signed   By: Elspeth Bathe M.D.   On: 07/10/2024 11:23     .Critical Care  Performed by: Hoy Nidia FALCON, PA-C Authorized by: Hoy Nidia FALCON, PA-C   Critical care provider statement:    Critical care time (minutes):  30   Critical care was necessary to treat or prevent imminent or life-threatening deterioration of the following conditions:  Cardiac failure   Critical care was time spent personally by me on the following activities:  Development of treatment plan with patient or surrogate, discussions with consultants, evaluation of patient's response to treatment, examination of patient, ordering and review of laboratory studies, ordering and review of radiographic studies, ordering and performing treatments and interventions, pulse oximetry, re-evaluation of patient's condition and review of old charts   Care discussed with: admitting provider   Comments:     New CHF diagnosis with exacerbation    Medications Ordered in the ED  iohexol  (OMNIPAQUE ) 350 MG/ML injection 75 mL (75 mLs Intravenous Contrast Given 07/10/24 1147)  potassium chloride  SA (KLOR-CON  M) CR tablet 40 mEq (40 mEq Oral Given 07/10/24 1236)  furosemide  (LASIX ) injection 20 mg (20 mg Intravenous Given 07/10/24 1235)                                    Medical Decision Making Amount and/or Complexity of Data Reviewed Labs: ordered. Radiology: ordered. ECG/medicine tests: ordered.  Risk Prescription drug management. Decision regarding hospitalization.   This  patient presents to the ED for concern of shortness of breath, this involves an extensive number of treatment options, and is a complaint that carries with it a high risk of complications and morbidity.  The differential diagnosis includes Anxiety, Anaphylaxis/Angioedema, Aspirated FB, Arrhythmia, CHF, Asthma, COPD, PNA, COVID/Flu/RSV, STEMI, Tamponade, TPNX, Sepsis   Co morbidities that complicate the patient evaluation  hepatocellular cancer receiving radiation therapy, headaches, HTN   Additional history obtained:  Additional history obtained from 11/2023 ECHO: 60-65% EF   Problem List / ED Course / Critical interventions / Medication management  Patient presents to ED concern for SOB developing over the past 1 week.  Physical exam reassuring.  Patient afebrile with stable vitals.  I Ordered, and personally interpreted labs.  CMP with mild hypokalemia 3.0.  Chloride mildly high at 113.  CO2 mildly low at 20.  Liver enzymes diffusely elevated.  CBC without leukocytosis or anemia.  BNP 7746.  Respiratory panel negative.  Initial troponin 67 - delta troponin pending at this time. The patient was maintained on a cardiac monitor.  I personally viewed and interpreted the cardiac monitored which showed an underlying rhythm of: sinus rhythm I ordered imaging studies including chest xray and CTA chest to assess for process contributing to patient's symptoms. I independently visualized and interpreted imaging which showed pulmonary edema, moderate right and small left pleural effusion, cardiomegaly. I agree with the radiologist interpretation. Patient will need inpatient workup for new CHF. Will start patient on potassium supplementation and 20mg  lasix . I requested consultation with the Hospitalist on-call Dr. Roxane,  and discussed lab and imaging findings as well as pertinent plan - they agree to admit patient. Staffed with Dr. Bernard. I have reviewed the patients home medicines and have made  adjustments as needed   Social Determinants of Health:  Cancer patient       Final diagnoses:  Acute on chronic congestive heart failure, unspecified heart failure type [facility])    ED Discharge Orders     None          Hoy Nidia FALCON, [place] 07/10/24 1257    Bernard Drivers, MD 07/11/24 [phone]

## 2024-07-10 NOTE — ED Notes (Signed)
 Pt reports increase SOB. Denies chest pain. O2 100% on RA. 2L [place] applied for comfort. Pt given meal tray and educated on fluid restriction and low sodium diet.

## 2024-07-11 ENCOUNTER — Inpatient Hospital Stay (HOSPITAL_COMMUNITY)

## 2024-07-11 DIAGNOSIS — I509 Heart failure, unspecified: Secondary | ICD-10-CM

## 2024-07-11 LAB — ECHOCARDIOGRAM COMPLETE
AR max vel: 0.76 cm2
AV Area VTI: 0.76 cm2
AV Area mean vel: 0.79 cm2
AV Mean grad: 33 mmHg
AV Peak grad: 59.9 mmHg
Ao pk vel: 3.87 m/s
Area-P 1/2: 4.4 cm2
Calc EF: 38.4 %
Height: 67 in
MV VTI: 0.31 cm2
P 1/2 time: 311 ms
Radius: 0.6 cm
S' Lateral: 4.4 cm
Single Plane A2C EF: 37.2 %
Single Plane A4C EF: 40.7 %
Weight: 1985.9 [oz_av]

## 2024-07-11 LAB — CBC
HCT: 42.5 % (ref 39.0–52.0)
Hemoglobin: 13.7 g/dL (ref 13.0–17.0)
MCH: 31.8 pg (ref 26.0–34.0)
MCHC: 32.2 g/dL (ref 30.0–36.0)
MCV: 98.6 fL (ref 80.0–100.0)
Platelets: 133 K/uL — ABNORMAL LOW (ref 150–400)
RBC: 4.31 MIL/uL (ref 4.22–5.81)
RDW: 17.5 % — ABNORMAL HIGH (ref 11.5–15.5)
WBC: 3 K/uL — ABNORMAL LOW (ref 4.0–10.5)
nRBC: 0 % (ref 0.0–0.2)

## 2024-07-11 LAB — BASIC METABOLIC PANEL WITH GFR
Anion gap: 14 (ref 5–15)
BUN: 16 mg/dL (ref 8–23)
CO2: 21 mmol/L — ABNORMAL LOW (ref 22–32)
Calcium: 8.7 mg/dL — ABNORMAL LOW (ref 8.9–10.3)
Chloride: 110 mmol/L (ref 98–111)
Creatinine, Ser: 1.17 mg/dL (ref 0.61–1.24)
GFR, Estimated: >60 mL/min (ref >60)
Glucose, Bld: 90 mg/dL (ref 70–99)
Potassium: 3.1 mmol/L — ABNORMAL LOW (ref 3.5–5.1)
Sodium: 144 mmol/L (ref 135–145)

## 2024-07-11 LAB — PRO BRAIN NATRIURETIC PEPTIDE: Pro Brain Natriuretic Peptide: 6052 pg/mL — ABNORMAL HIGH (ref <300.0)

## 2024-07-11 MED ORDER — ASPIRIN 81 MG PO TBEC
81.0000 mg | DELAYED_RELEASE_TABLET | Freq: Every day | ORAL | Status: DC
  Administered 2024-07-11 – 2024-07-12 (×2): 81 mg via ORAL
  Filled 2024-07-11 (×2): qty 1

## 2024-07-11 MED ORDER — IRBESARTAN 150 MG PO TABS
150.0000 mg | ORAL_TABLET | Freq: Every day | ORAL | Status: DC
  Administered 2024-07-11 – 2024-07-12 (×2): 150 mg via ORAL
  Filled 2024-07-11 (×2): qty 1

## 2024-07-11 MED ORDER — ATORVASTATIN CALCIUM 40 MG PO TABS
40.0000 mg | ORAL_TABLET | Freq: Every day | ORAL | Status: DC
  Administered 2024-07-11 – 2024-07-12 (×2): 40 mg via ORAL
  Filled 2024-07-11 (×2): qty 1

## 2024-07-11 MED ORDER — POTASSIUM CHLORIDE CRYS ER 20 MEQ PO TBCR
40.0000 meq | EXTENDED_RELEASE_TABLET | Freq: Two times a day (BID) | ORAL | Status: DC
  Administered 2024-07-11 – 2024-07-12 (×3): 40 meq via ORAL
  Filled 2024-07-11 (×3): qty 2

## 2024-07-11 NOTE — Progress Notes (Signed)
 PROGRESS NOTE    Corey Devita Sr.  FMW:[id number] DOB: [birth year] DOA: 07/10/2024 PCP: [name], MD    Brief Narrative:  68 year old with history of hypertension, hepatocellular carcinoma currently on oral chemotherapy presented to the hospital with dyspnea on exertion, orthopnea and wheezing for about 1 week without previous history of congestive heart failure.  In the emergency room, he was on 2 L oxygen.  Chest x-ray and CT scan consistent with interstitial fluid in the lungs, negative for pulmonary embolism.  Admitted on diuretics.  proBNP was 7746.  Subjective: Patient was seen and examined.  Getting echocardiogram.  Preliminary results shows echocardiogram with EF 35 to 40%.  Final read pending.  Patient denies any weight gain, leg edema.  He was more worried about going back to work.  After taking leave of absence for more than a year he had started working last week and now he has difficulty breathing.  Assessment & Plan:   Congestive heart failure, type unknown.  Presented with dyspnea, orthopnea, elevated BNP and pulmonary edema on the CT scan.  Previous normal echocardiogram. Continue diuresis with Lasix  20 mg IV twice daily.  Echocardiogram results pending.   replace potassium.  Patient on olmesartan  at home, resume. Holding beta-blockers until clinical stabilization.  Hepatocellular carcinoma: Patient takes Cabometyx , on hold today.  Hypokalemia: Replace and monitor.  Hypertension: Blood pressure is stable.  Resume olmesartan .  On Lasix .    DVT prophylaxis: enoxaparin  (LOVENOX ) injection 40 mg Start: 07/10/24 2200   Code Status: Full code Family Communication: None at the bedside Disposition Plan: Status is: Inpatient Remains inpatient appropriate because: Investigations, IV diuresis     Consultants:  None  Procedures:  None  Antimicrobials:  None     Objective: Vitals:   07/11/24 0500 07/11/24 0530 07/11/24 0900 07/11/24 1017  BP:   118/63 121/74 (!) 122/58  Pulse:  67 67 78  Resp:  16 16 (!) 22  Temp:  97.6 F (36.4 C)    TempSrc:  Oral    SpO2:  98%  100%  Weight: 56.3 kg     Height:        Intake/Output Summary (Last 24 hours) at 07/11/2024 1128 Last data filed at 07/11/2024 1000 Gross per 24 hour  Intake 120 ml  Output 2615 ml  Net -2495 ml   Filed Weights   07/10/24 1024 07/10/24 1811 07/11/24 0500  Weight: 59.4 kg 57.2 kg 56.3 kg    Examination:  General exam: Appears calm and comfortable.  Not in any distress.  Thinly built. Respiratory system: Clear to auscultation. Respiratory effort normal.  No added sounds.  On room air. Cardiovascular system: S1 & S2 heard, RRR. No pedal edema. Gastrointestinal system: Abdomen is nondistended, soft and nontender. No organomegaly or masses felt. Normal bowel sounds heard. Central nervous system: Alert and oriented. No focal neurological deficits. Extremities: Symmetric 5 x 5 power. Skin: No rashes, lesions or ulcers Psychiatry: Judgement and insight appear normal. Mood & affect appropriate.     Data Reviewed: I have personally reviewed following labs and imaging studies  CBC: Recent Labs  Lab 07/10/24 1026 07/10/24 1131 07/11/24 0528  WBC 3.2*  --  3.0*  NEUTROABS 1.8  --   --   HGB 13.6 13.9 13.7  HCT 41.3 41.0 42.5  MCV 97.2  --  98.6  PLT 148*  --  133*   Basic Metabolic Panel: Recent Labs  Lab 07/10/24 1026 07/10/24 1131 07/11/24 0528  NA 145 147*  144  K 3.0* 2.9* 3.1*  CL 113* 112* 110  CO2 20*  --  21*  GLUCOSE 97 104* 90  BUN 12 13 16   CREATININE 1.13 1.20 1.17  CALCIUM  8.9  --  8.7*   GFR: Estimated Creatinine Clearance: 49.5 mL/min (by C-G formula based on SCr of 1.17 mg/dL). Liver Function Tests: Recent Labs  Lab 07/10/24 1026  AST 206*  ALT 146*  ALKPHOS 889*  BILITOT 1.1  PROT 6.7  ALBUMIN 3.4*   No results for input(s): LIPASE, AMYLASE in the last 168 hours. No results for input(s): AMMONIA in the last  168 hours. Coagulation Profile: No results for input(s): INR, PROTIME in the last 168 hours. Cardiac Enzymes: No results for input(s): CKTOTAL, CKMB, CKMBINDEX, TROPONINI in the last 168 hours. BNP (last 3 results) Recent Labs    07/10/24 1040 07/11/24 0528  PROBNP 7,746.0* 6,052.0*   HbA1C: No results for input(s): HGBA1C in the last 72 hours. CBG: No results for input(s): GLUCAP in the last 168 hours. Lipid Profile: No results for input(s): CHOL, HDL, LDLCALC, TRIG, CHOLHDL, LDLDIRECT in the last 72 hours. Thyroid  Function Tests: No results for input(s): TSH, T4TOTAL, FREET4, T3FREE, THYROIDAB in the last 72 hours. Anemia Panel: No results for input(s): VITAMINB12, FOLATE, FERRITIN, TIBC, IRON, RETICCTPCT in the last 72 hours. Sepsis Labs: No results for input(s): PROCALCITON, LATICACIDVEN in the last 168 hours.  Recent Results (from the past 240 hours)  Resp panel by RT-PCR (RSV, Flu A&B, Covid) Anterior Nasal Swab     Status: None   Collection Time: 07/10/24 11:22 AM   Specimen: Anterior Nasal Swab  Result Value Ref Range Status   SARS Coronavirus 2 by RT PCR NEGATIVE NEGATIVE Final    Comment: (NOTE) SARS-CoV-2 target nucleic acids are NOT DETECTED.  The SARS-CoV-2 RNA is generally detectable in upper respiratory specimens during the acute phase of infection. The lowest concentration of SARS-CoV-2 viral copies this assay can detect is 138 copies/mL. A negative result does not preclude SARS-Cov-2 infection and should not be used as the sole basis for treatment or other patient management decisions. A negative result may occur with  improper specimen collection/handling, submission of specimen other than nasopharyngeal swab, presence of viral mutation(s) within the areas targeted by this assay, and inadequate number of viral copies(<138 copies/mL). A negative result must be combined with clinical observations,  patient history, and epidemiological information. The expected result is Negative.  Fact Sheet for Patients:  BloggerCourse.com  Fact Sheet for Healthcare Providers:  SeriousBroker.it  This test is no t yet approved or cleared by the [place]  FDA and  has been authorized for detection and/or diagnosis of SARS-CoV-2 by FDA under an Emergency Use Authorization (EUA). This EUA will remain  in effect (meaning this test can be used) for the duration of the COVID-19 declaration under Section 564(b)(1) of the Act, 21 U.S.C.section 360bbb-3(b)(1), unless the authorization is terminated  or revoked sooner.       Influenza A by PCR NEGATIVE NEGATIVE Final   Influenza B by PCR NEGATIVE NEGATIVE Final    Comment: (NOTE) The Xpert Xpress SARS-CoV-2/FLU/RSV plus assay is intended as an aid in the diagnosis of influenza from Nasopharyngeal swab specimens and should not be used as a sole basis for treatment. Nasal washings and aspirates are unacceptable for Xpert Xpress SARS-CoV-2/FLU/RSV testing.  Fact Sheet for Patients: BloggerCourse.com  Fact Sheet for Healthcare Providers: SeriousBroker.it  This test is not yet approved or cleared by the [place] FDA and has been authorized for detection and/or diagnosis of SARS-CoV-2 by FDA under an Emergency Use Authorization (EUA). This EUA will remain in effect (meaning this test can be used) for the duration of the COVID-19 declaration under Section 564(b)(1) of the Act, 21 U.S.C. section 360bbb-3(b)(1), unless the authorization is terminated or revoked.     Resp Syncytial Virus by PCR NEGATIVE NEGATIVE Final    Comment: (NOTE) Fact Sheet for Patients: BloggerCourse.com  Fact Sheet for Healthcare Providers: SeriousBroker.it  This test is not yet approved or cleared by the [place] FDA and has been authorized for detection and/or diagnosis of SARS-CoV-2 by FDA under an Emergency Use Authorization (EUA). This EUA will remain in effect (meaning this test can be used) for the duration of the COVID-19 declaration under Section 564(b)(1) of the Act, 21 U.S.C. section 360bbb-3(b)(1), unless the authorization is terminated or revoked.  Performed at [facility], [address] [place], [place] [address]          Radiology Studies: CT Angio Chest PE W/Cm &/Or Wo Cm Result Date: 07/10/2024 EXAM: CTA CHEST 07/10/2024 11:54:20 AM TECHNIQUE: CTA of the chest was performed after the administration of intravenous contrast. Multiplanar reformatted images are provided for review. MIP images are provided for review. Automated exposure control, iterative reconstruction, and/or weight based adjustment of the mA/kV was utilized to reduce the radiation dose to as low as reasonably achievable. COMPARISON: None available. CLINICAL HISTORY: Pulmonary embolism (PE) suspected, high prob. Per triage notes: Patient reports sob x 1 week. Worse with lying down. Complains of wheezing. Hx of hepatocellular carcinoma. No leg swelling. Denies CP. Family reports his color is off. Patient is labored and tachypneic in triage. FINDINGS: PULMONARY ARTERIES: Pulmonary arteries are adequately opacified for evaluation. No signs of acute pulmonary embolus to the distal segmental level. Early timing of the contrast bolus noted. MEDIASTINUM: The heart size is enlarged. No pericardial effusion. Aortic atherosclerosis. Coronary artery calcifications. LYMPH NODES: No enlarged mediastinal or hilar lymph nodes. LUNGS AND PLEURA: Bilateral pleural effusions: moderate on the right and small on the left. Interlobular septal thickening identified with diffuse ground-glass attenuation compatible with pulmonary edema. Mild emphysema with diffuse bronchial wall thickening. Subsegmental atelectasis  noted within the lingula and right middle lobe. UPPER ABDOMEN: Low attenuation lesion within the periphery of the right lobe of liver is again noted on axial image 167/5 corresponding to previously characterized suspicious for recurrent hepatoma or metastatic disease. No acute findings within the imaged portions of the upper abdomen. Bilateral renal calcifications. SOFT TISSUES AND BONES: Left-sided gynecomastia. IMPRESSION: 1. No evidence of pulmonary embolism to the distal segmental level. Early timing of the contrast bolus noted. 2. Findings compatible with pulmonary edema, including interlobular septal thickening and diffuse ground-glass attenuation. 3. Moderate right and small left pleural effusions. 4. Enlarged heart size with aortic atherosclerosis and coronary artery calcifications. No pericardial effusion. 5. Low attenuation lesion within the periphery of the right lobe of liver, suspicious for recurrent hepatoma or metastatic disease. Electronically signed by: Waddell Calk MD 07/10/2024 12:16 PM EDT RP Workstation: [id number]   DG Chest 2 View Result Date: 07/10/2024 CLINICAL DATA:  Shortness of breath for the past week. EXAM: CHEST - 2 VIEW COMPARISON:  12/08/2023 FINDINGS: Normal-sized heart with a mild increase in size. Interval diffuse prominence of the pulmonary vasculature and slight interstitial prominence with Kerley lines. Interval small left pleural effusion and patchy airspace opacity in the right lower lobe. Mild left lower lobe  atelectasis. Mild peribronchial thickening. Unremarkable bones. IMPRESSION: 1. Interval patchy airspace opacity in the right lower lobe, suspicious for pneumonia. 2. Interval mild congestive heart failure. 3. Interval small left pleural effusion and mild left lower lobe atelectasis. Electronically Signed   By: Elspeth Bathe M.D.   On: 07/10/2024 11:23        Scheduled Meds:  aspirin  EC  81 mg Oral Daily   atorvastatin   40 mg Oral Daily   enoxaparin   (LOVENOX ) injection  40 mg Subcutaneous Q24H   furosemide   20 mg Intravenous BID   irbesartan   150 mg Oral Daily   potassium chloride   40 mEq Oral BID   Continuous Infusions:   LOS: 1 day    Time spent: 51 minutes    Renato Applebaum, MD [facility]

## 2024-07-11 NOTE — Plan of Care (Signed)

## 2024-07-12 DIAGNOSIS — I350 Nonrheumatic aortic (valve) stenosis: Secondary | ICD-10-CM | POA: Diagnosis not present

## 2024-07-12 DIAGNOSIS — I5021 Acute systolic (congestive) heart failure: Secondary | ICD-10-CM

## 2024-07-12 LAB — BASIC METABOLIC PANEL WITH GFR
Anion gap: 13 (ref 5–15)
BUN: 18 mg/dL (ref 8–23)
CO2: 23 mmol/L (ref 22–32)
Calcium: 9.1 mg/dL (ref 8.9–10.3)
Chloride: 109 mmol/L (ref 98–111)
Creatinine, Ser: 1.27 mg/dL — ABNORMAL HIGH (ref 0.61–1.24)
GFR, Estimated: >60 mL/min (ref >60)
Glucose, Bld: 101 mg/dL — ABNORMAL HIGH (ref 70–99)
Potassium: 3.4 mmol/L — ABNORMAL LOW (ref 3.5–5.1)
Sodium: 145 mmol/L (ref 135–145)

## 2024-07-12 MED ORDER — POTASSIUM CHLORIDE CRYS ER 20 MEQ PO TBCR: 40.0000 meq | EXTENDED_RELEASE_TABLET | Freq: Every day | ORAL | 0 refills | Status: DC

## 2024-07-12 MED ORDER — POTASSIUM CHLORIDE CRYS ER 20 MEQ PO TBCR: 20.0000 meq | EXTENDED_RELEASE_TABLET | Freq: Every day | ORAL | 0 refills | Status: DC

## 2024-07-12 MED ORDER — FUROSEMIDE 40 MG PO TABS: 40.0000 mg | ORAL_TABLET | Freq: Every day | ORAL | 11 refills | Status: DC

## 2024-07-12 NOTE — Consult Note (Signed)
 Cardiology Consultation   Patient ID: Corey Tarri Sierra Sr. MRN: [id number]; DOB: 1956-10-25  Admit date: 07/10/2024 Date of Consult: 07/12/2024  PCP:  Corey Charlie FERNS, MD   [facility] Providers Cardiologist:  Annabella Scarce, MD        Patient Profile: Corey Navis Sr. is a 68 y.o. male with a hx of aortic stenosis and metastatic hepatocellular carcinoma who is being seen 07/12/2024 for the evaluation of acute decompensation of heart failure at the request of Dr. Raenelle.  History of Present Illness: Corey Armstrong is known to have a bicuspid aortic valve with aortic stenosis and aortic insufficiency that has been minimally symptomatic until recently.  Over the last week he has noticed exertional dyspnea rapidly progressing to orthopnea, but without signs of peripheral edema or significant weight gain.  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  On presentation to the emergency room, his chest x-ray showed findings consistent with congestive heart failure as well as concern for right lower lobe pneumonia, findings confirmed by CT of the chest which also shows moderate bilateral effusions, aortic atherosclerosis and coronary artery calcifications.  BNP was markedly elevated.  He was mildly hyponatremic and markedly hypokalemic.  He was previously taking losartan -HCTZ for his hypertension.  Echocardiogram performed during this hospitalization shows new reduction left ventricular systolic function with an EF that is now down to approximately 35% with interval dilation of the left ventricle.  There is also progression of aortic stenosis, which is severe, but in addition he has at least moderate aortic insufficiency and at least moderate, possibly severe mitral insufficiency.  On the previous echo performed in January he had normal left ventricular systolic function with EF 60  to 65% and had only mild mitral insufficiency.  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  Note that he has mild leukopenia (3.0K) and mild thrombocytopenia (130 3K) but he is not anemic.  Following treatment with intravenous diuretics has had good urine output with a net negative fluid balance of 3.6 L since admission.  Despite supplementation potassium remains low at 3.4.  There has been a slight increase in creatinine  Past Medical History:  Diagnosis Date   Headache    migraines   Hepatitis    HX OF hEP c ?    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    Past Surgical History:  Procedure Laterality Date   BREAST BIOPSY Left 03/26/2024   US  LT BREAST BX W LOC DEV 1ST LESION IMG BX SPEC US  GUIDE 03/26/2024 [facility] MAMMOGRAPHY   DECOMPRESSIVE LUMBAR LAMINECTOMY LEVEL 1 N/A 11/20/2023   Procedure: lumbar laminectomy right Lumbar five with decompression of nerve roots and excision of tumor;  Surgeon: Colon Shove, MD;  Location: [facility] OR;  Service: Neurosurgery;  Laterality: N/A;   IR RADIOLOGIST EVAL & MGMT  01/25/2020   IR RADIOLOGIST EVAL & MGMT  03/21/2020   IR RADIOLOGIST EVAL & MGMT  06/07/2020   IR RADIOLOGIST EVAL & MGMT  09/05/2020   IR RADIOLOGIST EVAL & MGMT  12/05/2020   IR RADIOLOGIST EVAL & MGMT  05/09/2021   IR RADIOLOGIST EVAL & MGMT  09/06/2021   IR RADIOLOGIST EVAL & MGMT  12/12/2021   IR RADIOLOGIST EVAL &  MGMT  06/25/2022   IR RADIOLOGIST EVAL & MGMT  07/24/2022   RADIOLOGY WITH ANESTHESIA N/A 03/01/2020   Procedure: CT WITH ANESTHESIA  THERMAL MICROWAVE  ABLATIION;  Surgeon: Karalee Beat, MD;  Location: [facility];  Service: Anesthesiology;  Laterality: N/A;   RADIOLOGY WITH ANESTHESIA N/A 07/17/2022   Procedure: CT MICROWAVE ABLATION;  Surgeon: Karalee Beat POUR, MD;  Location: [facility];  Service:  Radiology;  Laterality: N/A;       Scheduled Meds:  aspirin  EC  81 mg Oral Daily   atorvastatin   40 mg Oral Daily   enoxaparin  (LOVENOX ) injection  40 mg Subcutaneous Q24H   irbesartan   150 mg Oral Daily   potassium chloride   40 mEq Oral BID   Continuous Infusions:  PRN Meds: acetaminophen  **OR** acetaminophen , albuterol , ondansetron  **OR** ondansetron  (ZOFRAN ) IV, traZODone   Allergies:   No Known Allergies  Social History:   Social History   Socioeconomic History   Marital status: Divorced    Spouse name: Not on file   Number of children: 2   Years of education: 12   Highest education level: GED or equivalent  Occupational History   Occupation: Builds Product/process development scientist: [employer]    Comment: Out on disability for now  Tobacco Use   Smoking status: Former    Passive exposure: Current   Smokeless tobacco: Never   Tobacco comments:    3 per week  Vaping Use   Vaping status: Never Used  Substance and Sexual Activity   Alcohol use: Not Currently   Drug use: No   Sexual activity: Not Currently  Other Topics Concern   Not on file  Social History Narrative   Divorced but has relationship with ex   2 children   Daughter lives with him   Son in [place]      No living will   Ex wife Myraette should make decisions on his behalf   Would accept resuscitation   Not sure about tube feeds   Social Drivers of Health   Financial Resource Strain: Low Risk  (05/06/2024)   Overall Financial Resource Strain (CARDIA)    Difficulty of Paying Living Expenses: Not very hard  Food Insecurity: No Food Insecurity (07/10/2024)   Hunger Vital Sign    Worried About Running Out of Food in the Last Year: Never true    Ran Out of Food in the Last Year: Never true  Transportation Needs: No Transportation Needs (07/10/2024)   PRAPARE - Administrator, Civil Service (Medical): No    Lack of Transportation (Non-Medical): No  Physical Activity: Sufficiently Active  (05/06/2024)   Exercise Vital Sign    Days of Exercise per Week: 7 days    Minutes of Exercise per Session: 150+ min  Stress: Stress Concern Present (05/06/2024)   [employer] of Occupational Health - Occupational Stress Questionnaire    Feeling of Stress: To some extent  Social Connections: Moderately Integrated (07/10/2024)   Social Connection and Isolation Panel    Frequency of Communication with Friends and Family: Twice a week    Frequency of Social Gatherings with Friends and Family: Twice a week    Attends Religious Services: More than 4 times per year    Active Member of [employer] or Organizations: Yes    Attends Banker Meetings: 1 to 4 times per year    Marital Status: Divorced  Intimate Partner Violence: Not At Risk (07/10/2024)   Humiliation, Afraid, Rape, and Kick  questionnaire    Fear of Current or Ex-Partner: No    Emotionally Abused: No    Physically Abused: No    Sexually Abused: No    Family History:    Family History  Problem Relation Age of Onset   Arthritis Mother    Cancer Neg Hx    Diabetes Neg Hx    Drug abuse Neg Hx    Heart disease Neg Hx    Hyperlipidemia Neg Hx    Hypertension Neg Hx    Kidney disease Neg Hx    Stroke Neg Hx      ROS:  Please see the history of present illness.   All other ROS reviewed and negative.     Physical Exam/Data: Vitals:   07/11/24 1342 07/11/24 2024 07/12/24 0500 07/12/24 0515  BP: 127/64 128/76  122/62  Pulse: 71 72  73  Resp: 20 16  16   Temp: (!) 97.3 F (36.3 C) (!) 97.5 F (36.4 C)  98.5 F (36.9 C)  TempSrc: Oral Oral  Oral  SpO2: 97% 98%  100%  Weight:   56.2 kg   Height:        Intake/Output Summary (Last 24 hours) at 07/12/2024 0857 Last data filed at 07/12/2024 9380 Gross per 24 hour  Intake 120 ml  Output 1450 ml  Net -1330 ml      07/12/2024    5:00 AM 07/11/2024    5:00 AM 07/10/2024    6:11 PM  Last 3 Weights  Weight (lbs) 123 lb 14.4 oz 124 lb 1.9 oz 126 lb 1.7 oz  Weight  (kg) 56.2 kg 56.3 kg 57.2 kg     Body mass index is 19.41 kg/m.  General:  Well nourished, well developed, in no acute distress HEENT: normal Neck: no JVD Vascular: bilateral radiating carotid bruits; Distal pulses 2+ bilaterally Cardiac:  normal S1, absent A2 component of the S2; RRR; 2/6 late peaking aortic ejection murmur, 2/6 diastolic decrescendo murmur at RUSB, 2/6 MR apical holosystolic myrmur Lungs:  clear to auscultation bilaterally, no wheezing, rhonchi or rales  Abd: soft, nontender, no hepatomegaly  Ext: no edema Musculoskeletal:  No deformities, BUE and BLE strength normal and equal Skin: warm and dry  Neuro:  CNs 2-12 intact, no focal abnormalities noted Psych:  Normal affect   EKG:  The EKG was personally reviewed and demonstrates: Normal sinus rhythm, left ventricular hypertrophy with secondary repull abnormalities, prolonged QTc 536 ms (in the setting of hypokalemia). Telemetry:  Telemetry was personally reviewed and demonstrates: Sinus rhythm  Relevant CV Studies: Echocardiogram 07/11/2024  1. LVEDV, 3D, 188 mL. Apical sparring pattern on strain polar map; in  conjunction with aortic valve disease, can be seen in ATTR-CA. Left  ventricular ejection fraction, by estimation, is 30 to 35%. Left  ventricular ejection fraction by 3D volume is 41  %. The left ventricle has moderately decreased function. The left  ventricle demonstrates global hypokinesis. The left ventricular internal  cavity size was dilated. There is mild concentric left ventricular  hypertrophy. Left ventricular diastolic  parameters are indeterminate. The average left ventricular global  longitudinal strain is -10.7 %. The global longitudinal strain is  abnormal.   2. Right ventricular systolic function is mildly reduced. The right  ventricular size is normal. There is normal pulmonary artery systolic  pressure.   3. Left atrial size was severely dilated.   4. Right atrial size was mildly  dilated.   5. The mitral valve is grossly normal. Severe  mitral valve regurgitation.  No evidence of mitral stenosis.   6. There appear to be multiple jets vs eccentric flow; best seen in AC3  view. DVI 0.24. The aortic valve is calcified. Aortic valve regurgitation  is moderate. Severe aortic valve stenosis. Aortic valve area, by VTI  measures 0.76 cm. Aortic valve mean  gradient measures 33.0 mmHg. Aortic valve Vmax measures 3.87 m/s.   7. The inferior vena cava is normal in size with <50% respiratory  variability, suggesting right atrial pressure of 8 mmHg.   Comparison(s): Prior images reviewed side by side. Internal progression of  disease with worsening ventricular function and mitral regurgitation.   Laboratory Data: High Sensitivity Troponin:  No results for input(s): TROPONINIHS in the last 720 hours.   Chemistry Recent Labs  Lab 07/10/24 1026 07/10/24 1131 07/11/24 0528 07/12/24 0507  NA 145 147* 144 145  K 3.0* 2.9* 3.1* 3.4*  CL 113* 112* 110 109  CO2 20*  --  21* 23  GLUCOSE 97 104* 90 101*  BUN 12 13 16 18   CREATININE 1.13 1.20 1.17 1.27*  CALCIUM  8.9  --  8.7* 9.1  GFRNONAA >60  --  >60 >60  ANIONGAP 12  --  14 13    Recent Labs  Lab 07/10/24 1026  PROT 6.7  ALBUMIN 3.4*  AST 206*  ALT 146*  ALKPHOS 889*  BILITOT 1.1   Lipids No results for input(s): CHOL, TRIG, HDL, LABVLDL, LDLCALC, CHOLHDL in the last 168 hours.  Hematology Recent Labs  Lab 07/10/24 1026 07/10/24 1131 07/11/24 0528  WBC 3.2*  --  3.0*  RBC 4.25  --  4.31  HGB 13.6 13.9 13.7  HCT 41.3 41.0 42.5  MCV 97.2  --  98.6  MCH 32.0  --  31.8  MCHC 32.9  --  32.2  RDW 17.9*  --  17.5*  PLT 148*  --  133*   Thyroid  No results for input(s): TSH, FREET4 in the last 168 hours.  BNP Recent Labs  Lab 07/10/24 1040 07/11/24 0528  PROBNP 7,746.0* 6,052.0*    DDimer No results for input(s): DDIMER in the last 168 hours.  Radiology/Studies:  ECHOCARDIOGRAM  COMPLETE Result Date: 07/11/2024    ECHOCARDIOGRAM REPORT   Patient Name:   Corey Cullars Sr. Date of Exam: 07/11/2024 Medical Rec #:  [id number]                  Height:       67.0 in Accession #:    [id number]                 Weight:       124.1 lb Date of Birth:  Mar 30, 1956                   BSA:          1.651 m Patient Age:    68 years                   BP:           118/63 mmHg Patient Gender: M                          HR:           71 bpm. Exam Location:  Inpatient Procedure: 2D Echo, 3D Echo, Cardiac Doppler, Color Doppler and Strain Analysis            (  Both Spectral and Color Flow Doppler were utilized during            procedure). Indications:    CHF  History:        Patient has prior history of Echocardiogram examinations, most                 recent 11/19/2023. TIA, Aortic Valve Disease; Risk                 Factors:Hypertension.  Sonographer:    Therisa Crouch Referring Phys: [id number] MIR M [facility] IMPRESSIONS  1. LVEDV, 3D, 188 mL. Apical sparring pattern on strain polar map; in conjunction with aortic valve disease, can be seen in ATTR-CA. Left ventricular ejection fraction, by estimation, is 30 to 35%. Left ventricular ejection fraction by 3D volume is 41 %. The left ventricle has moderately decreased function. The left ventricle demonstrates global hypokinesis. The left ventricular internal cavity size was dilated. There is mild concentric left ventricular hypertrophy. Left ventricular diastolic parameters are indeterminate. The average left ventricular global longitudinal strain is -10.7 %. The global longitudinal strain is abnormal.  2. Right ventricular systolic function is mildly reduced. The right ventricular size is normal. There is normal pulmonary artery systolic pressure.  3. Left atrial size was severely dilated.  4. Right atrial size was mildly dilated.  5. The mitral valve is grossly normal. Severe mitral valve regurgitation. No evidence of mitral stenosis.  6. There appear to be  multiple jets vs eccentric flow; best seen in AC3 view. DVI 0.24. The aortic valve is calcified. Aortic valve regurgitation is moderate. Severe aortic valve stenosis. Aortic valve area, by VTI measures 0.76 cm. Aortic valve mean gradient measures 33.0 mmHg. Aortic valve Vmax measures 3.87 m/s.  7. The inferior vena cava is normal in size with <50% respiratory variability, suggesting right atrial pressure of 8 mmHg. Comparison(s): Prior images reviewed side by side. Internal progression of disease with worsening ventricular function and mitral regurgitation. FINDINGS  Left Ventricle: LVEDV, 3D, 188 mL. Apical sparring pattern on strain polar map; in conjunction with aortic valve disease, can be seen in ATTR-CA. Left ventricular ejection fraction, by estimation, is 30 to 35%. Left ventricular ejection fraction by 3D volume is 41 %. The left ventricle has moderately decreased function. The left ventricle demonstrates global hypokinesis. The average left ventricular global longitudinal strain is -10.7 %. Strain was performed and the global longitudinal strain is abnormal. The left ventricular internal cavity size was dilated. There is mild concentric left ventricular hypertrophy. Left ventricular diastolic parameters are indeterminate. Right Ventricle: The right ventricular size is normal. No increase in right ventricular wall thickness. Right ventricular systolic function is mildly reduced. There is normal pulmonary artery systolic pressure. The tricuspid regurgitant velocity is 2.48 m/s, and with an assumed right atrial pressure of 8 mmHg, the estimated right ventricular systolic pressure is 32.6 mmHg. Left Atrium: Left atrial size was severely dilated. Right Atrium: Right atrial size was mildly dilated. Pericardium: Trivial pericardial effusion is present. The pericardial effusion is posterior to the left ventricle. Mitral Valve: The mitral valve is grossly normal. Severe mitral valve regurgitation. No evidence of  mitral valve stenosis. MV peak gradient, 136.9 mmHg. The mean mitral valve gradient is 92.0 mmHg. Tricuspid Valve: The tricuspid valve is normal in structure. Tricuspid valve regurgitation is not demonstrated. No evidence of tricuspid stenosis. Aortic Valve: There appear to be multiple jets vs eccentric flow; best seen in AC3 view. DVI 0.24. The aortic valve is calcified.  Aortic valve regurgitation is moderate. Aortic regurgitation PHT measures 311 msec. Severe aortic stenosis is present. Aortic valve mean gradient measures 33.0 mmHg. Aortic valve peak gradient measures 59.9 mmHg. Aortic valve area, by VTI measures 0.76 cm. Pulmonic Valve: The pulmonic valve was normal in structure. Pulmonic valve regurgitation is trivial. No evidence of pulmonic stenosis. Aorta: The aortic root and ascending aorta are structurally normal, with no evidence of dilitation. Venous: The inferior vena cava is normal in size with less than 50% respiratory variability, suggesting right atrial pressure of 8 mmHg. IAS/Shunts: No atrial level shunt detected by color flow Doppler. Additional Comments: 3D was performed not requiring image post processing on an independent workstation and was abnormal.  LEFT VENTRICLE PLAX 2D LVIDd:         5.70 cm         Diastology LVIDs:         4.40 cm         LV e' medial:    4.74 cm/s LV PW:         1.00 cm         LV E/e' medial:  25.3 LV IVS:        1.00 cm         LV e' lateral:   6.25 cm/s LVOT diam:     2.00 cm         LV E/e' lateral: 19.2 LV SV:         57 LV SV Index:   35              2D Longitudinal LVOT Area:     3.14 cm        Strain                                2D Strain GLS   -10.7 %                                Avg: LV Volumes (MOD) LV vol d, MOD    180.0 ml      3D Volume EF A2C:                           LV 3D EF:    Left LV vol d, MOD    172.0 ml                   ventricul A4C:                                        ar LV vol s, MOD    113.0 ml                   ejection A2C:                                         fraction LV vol s, MOD    102.0 ml                   by 3D A4C:  volume is LV SV MOD A2C:   67.0 ml                    41 %. LV SV MOD A4C:   172.0 ml      LV 3D EDV:   188.00 ml LV SV MOD BP:    70.2 ml       LV 3D ESV:   111.36 ml                                 3D Volume EF:                                3D EF:        41 % RIGHT VENTRICLE            IVC RV Basal diam:  3.30 cm    IVC diam: 1.90 cm RV S prime:     8.18 cm/s TAPSE (M-mode): 1.5 cm LEFT ATRIUM             Index        RIGHT ATRIUM           Index LA diam:        3.70 cm 2.24 cm/m   RA Area:     18.40 cm LA Vol (A2C):   71.1 ml 43.06 ml/m  RA Volume:   53.00 ml  32.10 ml/m LA Vol (A4C):   84.9 ml 51.42 ml/m LA Biplane Vol: 80.9 ml 49.00 ml/m  AORTIC VALVE AV Area (Vmax):    0.76 cm AV Area (Vmean):   0.79 cm AV Area (VTI):     0.76 cm AV Vmax:           387.00 cm/s AV Vmean:          256.333 cm/s AV VTI:            0.753 m AV Peak Grad:      59.9 mmHg AV Mean Grad:      33.0 mmHg LVOT Vmax:         93.90 cm/s LVOT Vmean:        64.700 cm/s LVOT VTI:          0.182 m LVOT/AV VTI ratio: 0.24 AI PHT:            311 msec  AORTA Ao Root diam: 3.60 cm Ao Asc diam:  3.30 cm MITRAL VALVE                TRICUSPID VALVE MV Area (PHT): 4.40 cm     TV Peak grad:   24.6 mmHg MV Area VTI:   0.31 cm     TV Vmax:        2.48 m/s MV Peak grad:  136.9 mmHg   TR Peak grad:   24.6 mmHg MV Mean grad:  92.0 mmHg    TR Vmax:        248.00 cm/s MV Vmax:       5.85 m/s MV Vmean:      458.0 cm/s   SHUNTS MR PISA:        2.26 cm    Systemic VTI:  0.18 m MR PISA Radius: 0.60 cm     Systemic Diam: 2.00 cm MV E velocity: 120.00 cm/s MV A velocity: 48.40 cm/s MV E/A ratio:  2.48 Stanly Leavens MD Electronically signed by Stanly Leavens MD Signature Date/Time: 07/11/2024/12:52:36 PM    Final    CT Angio Chest PE W/Cm &/Or Wo Cm Result Date: 07/10/2024 EXAM: CTA CHEST 07/10/2024  11:54:20 AM TECHNIQUE: CTA of the chest was performed after the administration of intravenous contrast. Multiplanar reformatted images are provided for review. MIP images are provided for review. Automated exposure control, iterative reconstruction, and/or weight based adjustment of the mA/kV was utilized to reduce the radiation dose to as low as reasonably achievable. COMPARISON: None available. CLINICAL HISTORY: Pulmonary embolism (PE) suspected, high prob. Per triage notes: Patient reports sob x 1 week. Worse with lying down. Complains of wheezing. Hx of hepatocellular carcinoma. No leg swelling. Denies CP. Family reports his color is off. Patient is labored and tachypneic in triage. FINDINGS: PULMONARY ARTERIES: Pulmonary arteries are adequately opacified for evaluation. No signs of acute pulmonary embolus to the distal segmental level. Early timing of the contrast bolus noted. MEDIASTINUM: The heart size is enlarged. No pericardial effusion. Aortic atherosclerosis. Coronary artery calcifications. LYMPH NODES: No enlarged mediastinal or hilar lymph nodes. LUNGS AND PLEURA: Bilateral pleural effusions: moderate on the right and small on the left. Interlobular septal thickening identified with diffuse ground-glass attenuation compatible with pulmonary edema. Mild emphysema with diffuse bronchial wall thickening. Subsegmental atelectasis noted within the lingula and right middle lobe. UPPER ABDOMEN: Low attenuation lesion within the periphery of the right lobe of liver is again noted on axial image 167/5 corresponding to previously characterized suspicious for recurrent hepatoma or metastatic disease. No acute findings within the imaged portions of the upper abdomen. Bilateral renal calcifications. SOFT TISSUES AND BONES: Left-sided gynecomastia. IMPRESSION: 1. No evidence of pulmonary embolism to the distal segmental level. Early timing of the contrast bolus noted. 2. Findings compatible with pulmonary edema,  including interlobular septal thickening and diffuse ground-glass attenuation. 3. Moderate right and small left pleural effusions. 4. Enlarged heart size with aortic atherosclerosis and coronary artery calcifications. No pericardial effusion. 5. Low attenuation lesion within the periphery of the right lobe of liver, suspicious for recurrent hepatoma or metastatic disease. Electronically signed by: Waddell Calk MD 07/10/2024 12:16 PM EDT RP Workstation: [id number]   DG Chest 2 View Result Date: 07/10/2024 CLINICAL DATA:  Shortness of breath for the past week. EXAM: CHEST - 2 VIEW COMPARISON:  12/08/2023 FINDINGS: Normal-sized heart with a mild increase in size. Interval diffuse prominence of the pulmonary vasculature and slight interstitial prominence with Kerley lines. Interval small left pleural effusion and patchy airspace opacity in the right lower lobe. Mild left lower lobe atelectasis. Mild peribronchial thickening. Unremarkable bones. IMPRESSION: 1. Interval patchy airspace opacity in the right lower lobe, suspicious for pneumonia. 2. Interval mild congestive heart failure. 3. Interval small left pleural effusion and mild left lower lobe atelectasis. Electronically Signed   By: Elspeth Bathe M.D.   On: 07/10/2024 11:23     Assessment and Plan: Severe AS: had a lengthy discussion with Mr. Thal and his wife (a former CCU nurse). He is a poor candidate for TAVR/SVAR due to advanced hepatocellular cancer with spinal mets. Prognosis for survival with AS/CHF is probably 12 months, at most 24 months without AVR. 2 years ago he was told that he could expect a 3 year survival with his liver cancer. If his current life expectancy from liver Ca is under 12 months, then AVR (and the preceding workup) should not be pursued. Palliate symptoms with diuretics. Based on hypokalemia on admission, I suspect he  may still be on olmesartan  HCT, although the med rec at admission reported plain olmesartan .Stop the  hydrochlorothiazide component of his olmesartan  HCT and prescribe furosemide  40 mg daily, with KCl 20 mEq daily. We can fine tune the necessary diuretic dose as an OP. Strongly consider palliative care consultation.   Risk Assessment/Risk Scores:       [place]  Heart Association (NYHA) Functional Class NYHA Class IV    [facility] will sign off.   The patient is ready for discharge today from a cardiac standpoint. Medication Recommendations:   furosemide  40 mg daily KCl 20 mEq daily Olmesartan  20 mg daily Stop any HTN med with hydrochlorothiazide that he may have been taking. Other recommendations (labs, testing, etc):  BMET in one week. Follow up as an outpatient:  he has appointments tomorrow with Reche Finder in the Cardiology office and with his Oncologist , Dr. Lanny.  For questions or updates, please contact [facility] Please consult [email or web address] for contact info under    Signed, Jerel Balding, MD  07/12/2024 8:57 AM

## 2024-07-12 NOTE — Plan of Care (Signed)

## 2024-07-12 NOTE — Plan of Care (Signed)
 Problem: Education: Goal: Knowledge of General Education information will improve Description: Including pain rating scale, medication(s)/side effects and non-pharmacologic comfort measures 07/12/2024 1130 by Terance Leonor BRAVO, RN Outcome: Adequate for Discharge 07/12/2024 0901 by Terance Leonor BRAVO, RN Outcome: Progressing   Problem: Health Behavior/Discharge Planning: Goal: Ability to manage health-related needs will improve 07/12/2024 1130 by Terance Leonor BRAVO, RN Outcome: Adequate for Discharge 07/12/2024 0901 by Terance Leonor BRAVO, RN Outcome: Progressing   Problem: Clinical Measurements: Goal: Ability to maintain clinical measurements within normal limits will improve 07/12/2024 1130 by Terance Leonor BRAVO, RN Outcome: Adequate for Discharge 07/12/2024 0901 by Terance Leonor BRAVO, RN Outcome: Progressing Goal: Will remain free from infection 07/12/2024 1130 by Terance Leonor BRAVO, RN Outcome: Adequate for Discharge 07/12/2024 0901 by Terance Leonor BRAVO, RN Outcome: Progressing Goal: Diagnostic test results will improve 07/12/2024 1130 by Terance Leonor BRAVO, RN Outcome: Adequate for Discharge 07/12/2024 0901 by Terance Leonor BRAVO, RN Outcome: Progressing Goal: Respiratory complications will improve 07/12/2024 1130 by Terance Leonor BRAVO, RN Outcome: Adequate for Discharge 07/12/2024 0901 by Terance Leonor BRAVO, RN Outcome: Progressing Goal: Cardiovascular complication will be avoided 07/12/2024 1130 by Terance Leonor BRAVO, RN Outcome: Adequate for Discharge 07/12/2024 0901 by Terance Leonor BRAVO, RN Outcome: Progressing   Problem: Activity: Goal: Risk for activity intolerance will decrease 07/12/2024 1130 by Terance Leonor BRAVO, RN Outcome: Adequate for Discharge 07/12/2024 0901 by Terance Leonor BRAVO, RN Outcome: Progressing   Problem: Nutrition: Goal: Adequate nutrition will be maintained 07/12/2024 1130 by Terance Leonor BRAVO, RN Outcome: Adequate for Discharge 07/12/2024 0901 by  Terance Leonor BRAVO, RN Outcome: Progressing   Problem: Coping: Goal: Level of anxiety will decrease 07/12/2024 1130 by Terance Leonor BRAVO, RN Outcome: Adequate for Discharge 07/12/2024 0901 by Terance Leonor BRAVO, RN Outcome: Progressing   Problem: Elimination: Goal: Will not experience complications related to bowel motility 07/12/2024 1130 by Terance Leonor BRAVO, RN Outcome: Adequate for Discharge 07/12/2024 0901 by Terance Leonor BRAVO, RN Outcome: Progressing Goal: Will not experience complications related to urinary retention 07/12/2024 1130 by Terance Leonor BRAVO, RN Outcome: Adequate for Discharge 07/12/2024 0901 by Terance Leonor BRAVO, RN Outcome: Progressing   Problem: Pain Managment: Goal: General experience of comfort will improve and/or be controlled 07/12/2024 1130 by Terance Leonor BRAVO, RN Outcome: Adequate for Discharge 07/12/2024 0901 by Terance Leonor BRAVO, RN Outcome: Progressing   Problem: Safety: Goal: Ability to remain free from injury will improve 07/12/2024 1130 by Terance Leonor BRAVO, RN Outcome: Adequate for Discharge 07/12/2024 0901 by Terance Leonor BRAVO, RN Outcome: Progressing   Problem: Skin Integrity: Goal: Risk for impaired skin integrity will decrease 07/12/2024 1130 by Terance Leonor BRAVO, RN Outcome: Adequate for Discharge 07/12/2024 0901 by Terance Leonor BRAVO, RN Outcome: Progressing   Problem: Education: Goal: Ability to demonstrate management of disease process will improve 07/12/2024 1130 by Terance Leonor BRAVO, RN Outcome: Adequate for Discharge 07/12/2024 0901 by Terance Leonor BRAVO, RN Outcome: Progressing Goal: Ability to verbalize understanding of medication therapies will improve 07/12/2024 1130 by Terance Leonor BRAVO, RN Outcome: Adequate for Discharge 07/12/2024 0901 by Terance Leonor BRAVO, RN Outcome: Progressing Goal: Individualized Educational Video(s) 07/12/2024 1130 by Terance Leonor BRAVO, RN Outcome: Adequate for Discharge 07/12/2024 0901  by Terance Leonor BRAVO, RN Outcome: Progressing   Problem: Activity: Goal: Capacity to carry out activities will improve 07/12/2024 1130 by Terance Leonor BRAVO, RN Outcome: Adequate for Discharge 07/12/2024 0901 by Terance Leonor BRAVO, RN Outcome: Progressing   Problem: Cardiac: Goal: Ability to achieve and maintain  adequate cardiopulmonary perfusion will improve 07/12/2024 1130 by Terance Leonor BRAVO, RN Outcome: Adequate for Discharge 07/12/2024 0901 by Terance Leonor BRAVO, RN Outcome: Progressing

## 2024-07-12 NOTE — Discharge Summary (Signed)
 Physician Discharge Summary  Ved Martos Sr. FMW:[id number] DOB: [birth year] DOA: 07/10/2024  PCP: [name], MD  Admit date: 07/10/2024 Discharge date: 07/12/2024  Admitted From: Home Disposition: Home  Recommendations for Outpatient Follow-up:  Follow up with PCP in 1-2 weeks Please obtain BMP/CBC in one week Cardiology will schedule follow-up  Home Health: N/A Equipment/Devices: N/A  Discharge Condition: Stable CODE STATUS: Full code Diet recommendation: Low-salt diet  Discharge summary: 68 year old with history of hypertension, hepatocellular carcinoma currently on oral chemotherapy presented to the hospital with dyspnea on exertion, orthopnea and wheezing for about 1 week without previous history of congestive heart failure.  In the emergency room, he was on 2 L oxygen.  Chest x-ray and CT scan consistent with interstitial fluid in the lungs, negative for pulmonary embolism.  Admitted on diuretics.  proBNP was 7746.  Some clinical improvement with gentle diuresis.  Echocardiogram consistent with severe aortic stenosis and ejection fraction 30 to 35%.  Acute systolic congestive heart failure secondary to valvular heart disease, severe aortic stenosis: Patient has progressive severe aortic stenosis. Given underlying metastatic hepatocellular carcinoma, not a candidate for aortic valve repair as per cardiology.  Currently suggesting symptom management. Patient will be going home on olmesartan , Lasix  40 mg daily, potassium supplementation.  Potassium 40 for 1 week then followed by 20 daily. Currently symptoms are well-controlled.  Hepatocellular carcinoma: Patient takes Cabometyx , can resume today.  Outpatient oncology follow-up.  Hypokalemia: Replace as above.  Stabilized to discharge home with close outpatient follow-up.     Discharge Diagnoses:  Principal Problem:   Acute CHF (congestive heart failure) [facility])    Discharge Instructions  Discharge  Instructions     Diet - low sodium heart healthy   Complete by: As directed    Increase activity slowly   Complete by: As directed       Allergies as of 07/12/2024   No Known Allergies      Medication List     STOP taking these medications    pantoprazole  20 MG tablet Commonly known as: PROTONIX        TAKE these medications    acetaminophen  500 MG tablet Commonly known as: TYLENOL  Take 1,000 mg by mouth every 6 (six) hours as needed.   aspirin  EC 81 MG tablet Take 1 tablet (81 mg total) by mouth daily. Swallow whole. What changed: when to take this   atorvastatin  40 MG tablet Commonly known as: Lipitor Take 1 tablet (40 mg total) by mouth daily. What changed: when to take this   Cabometyx  60 MG tablet Generic drug: cabozantinib TAKE 1 TABLET BY MOUTH 1 TIME A DAY ON AN EMPTY STOMACH, 1 HOUR BEFORE OR 2 HOURS AFTER MEALS. What changed: See the new instructions.   furosemide  40 MG tablet Commonly known as: Lasix  Take 1 tablet (40 mg total) by mouth daily.   hydrALAZINE  10 MG tablet Commonly known as: APRESOLINE  TAKE 1 TABLET 2 (TWO) TIMES DAILY AS NEEDED. TAKE FOR SYSTOLIC BLOOD PRESSURE MORE THAN 170. What changed: See the new instructions.   olmesartan  20 MG tablet Commonly known as: BENICAR  Take 1 tablet (20 mg total) by mouth daily. What changed: when to take this   potassium chloride  SA 20 MEQ tablet Commonly known as: KLOR-CON  M Take 2 tablets (40 mEq total) by mouth daily for 7 days.   potassium chloride  SA 20 MEQ tablet Commonly known as: KLOR-CON  M Take 1 tablet (20 mEq total) by mouth daily. Start taking on: July 20, 2024        No Known Allergies  Consultations: Cardiology   Procedures/Studies: ECHOCARDIOGRAM COMPLETE Result Date: 07/11/2024    ECHOCARDIOGRAM REPORT   Patient Name:   Corey Grahn Sr. Date of Exam: 07/11/2024 Medical Rec #:  [id number]                  Height:       67.0 in Accession #:    [id number]                  Weight:       124.1 lb Date of Birth:  1956/04/07                   BSA:          1.651 m Patient Age:    68 years                   BP:           118/63 mmHg Patient Gender: M                          HR:           71 bpm. Exam Location:  Inpatient Procedure: 2D Echo, 3D Echo, Cardiac Doppler, Color Doppler and Strain Analysis            (Both Spectral and Color Flow Doppler were utilized during            procedure). Indications:    CHF  History:        Patient has prior history of Echocardiogram examinations, most                 recent 11/19/2023. TIA, Aortic Valve Disease; Risk                 Factors:Hypertension.  Sonographer:    Therisa Crouch Referring Phys: [id number] MIR M [facility] IMPRESSIONS  1. LVEDV, 3D, 188 mL. Apical sparring pattern on strain polar map; in conjunction with aortic valve disease, can be seen in ATTR-CA. Left ventricular ejection fraction, by estimation, is 30 to 35%. Left ventricular ejection fraction by 3D volume is 41 %. The left ventricle has moderately decreased function. The left ventricle demonstrates global hypokinesis. The left ventricular internal cavity size was dilated. There is mild concentric left ventricular hypertrophy. Left ventricular diastolic parameters are indeterminate. The average left ventricular global longitudinal strain is -10.7 %. The global longitudinal strain is abnormal.  2. Right ventricular systolic function is mildly reduced. The right ventricular size is normal. There is normal pulmonary artery systolic pressure.  3. Left atrial size was severely dilated.  4. Right atrial size was mildly dilated.  5. The mitral valve is grossly normal. Severe mitral valve regurgitation. No evidence of mitral stenosis.  6. There appear to be multiple jets vs eccentric flow; best seen in AC3 view. DVI 0.24. The aortic valve is calcified. Aortic valve regurgitation is moderate. Severe aortic valve stenosis. Aortic valve area, by VTI measures 0.76 cm. Aortic  valve mean gradient measures 33.0 mmHg. Aortic valve Vmax measures 3.87 m/s.  7. The inferior vena cava is normal in size with <50% respiratory variability, suggesting right atrial pressure of 8 mmHg. Comparison(s): Prior images reviewed side by side. Internal progression of disease with worsening ventricular function and mitral regurgitation. FINDINGS  Left Ventricle: LVEDV, 3D, 188 mL. Apical sparring pattern on strain polar map; in  conjunction with aortic valve disease, can be seen in ATTR-CA. Left ventricular ejection fraction, by estimation, is 30 to 35%. Left ventricular ejection fraction by 3D volume is 41 %. The left ventricle has moderately decreased function. The left ventricle demonstrates global hypokinesis. The average left ventricular global longitudinal strain is -10.7 %. Strain was performed and the global longitudinal strain is abnormal. The left ventricular internal cavity size was dilated. There is mild concentric left ventricular hypertrophy. Left ventricular diastolic parameters are indeterminate. Right Ventricle: The right ventricular size is normal. No increase in right ventricular wall thickness. Right ventricular systolic function is mildly reduced. There is normal pulmonary artery systolic pressure. The tricuspid regurgitant velocity is 2.48 m/s, and with an assumed right atrial pressure of 8 mmHg, the estimated right ventricular systolic pressure is 32.6 mmHg. Left Atrium: Left atrial size was severely dilated. Right Atrium: Right atrial size was mildly dilated. Pericardium: Trivial pericardial effusion is present. The pericardial effusion is posterior to the left ventricle. Mitral Valve: The mitral valve is grossly normal. Severe mitral valve regurgitation. No evidence of mitral valve stenosis. MV peak gradient, 136.9 mmHg. The mean mitral valve gradient is 92.0 mmHg. Tricuspid Valve: The tricuspid valve is normal in structure. Tricuspid valve regurgitation is not demonstrated. No  evidence of tricuspid stenosis. Aortic Valve: There appear to be multiple jets vs eccentric flow; best seen in AC3 view. DVI 0.24. The aortic valve is calcified. Aortic valve regurgitation is moderate. Aortic regurgitation PHT measures 311 msec. Severe aortic stenosis is present. Aortic valve mean gradient measures 33.0 mmHg. Aortic valve peak gradient measures 59.9 mmHg. Aortic valve area, by VTI measures 0.76 cm. Pulmonic Valve: The pulmonic valve was normal in structure. Pulmonic valve regurgitation is trivial. No evidence of pulmonic stenosis. Aorta: The aortic root and ascending aorta are structurally normal, with no evidence of dilitation. Venous: The inferior vena cava is normal in size with less than 50% respiratory variability, suggesting right atrial pressure of 8 mmHg. IAS/Shunts: No atrial level shunt detected by color flow Doppler. Additional Comments: 3D was performed not requiring image post processing on an independent workstation and was abnormal.  LEFT VENTRICLE PLAX 2D LVIDd:         5.70 cm         Diastology LVIDs:         4.40 cm         LV e' medial:    4.74 cm/s LV PW:         1.00 cm         LV E/e' medial:  25.3 LV IVS:        1.00 cm         LV e' lateral:   6.25 cm/s LVOT diam:     2.00 cm         LV E/e' lateral: 19.2 LV SV:         57 LV SV Index:   35              2D Longitudinal LVOT Area:     3.14 cm        Strain                                2D Strain GLS   -10.7 %  Avg: LV Volumes (MOD) LV vol d, MOD    180.0 ml      3D Volume EF A2C:                           LV 3D EF:    Left LV vol d, MOD    172.0 ml                   ventricul A4C:                                        ar LV vol s, MOD    113.0 ml                   ejection A2C:                                        fraction LV vol s, MOD    102.0 ml                   by 3D A4C:                                        volume is LV SV MOD A2C:   67.0 ml                    41 %. LV SV MOD A4C:    172.0 ml      LV 3D EDV:   188.00 ml LV SV MOD BP:    70.2 ml       LV 3D ESV:   111.36 ml                                 3D Volume EF:                                3D EF:        41 % RIGHT VENTRICLE            IVC RV Basal diam:  3.30 cm    IVC diam: 1.90 cm RV S prime:     8.18 cm/s TAPSE (M-mode): 1.5 cm LEFT ATRIUM             Index        RIGHT ATRIUM           Index LA diam:        3.70 cm 2.24 cm/m   RA Area:     18.40 cm LA Vol (A2C):   71.1 ml 43.06 ml/m  RA Volume:   53.00 ml  32.10 ml/m LA Vol (A4C):   84.9 ml 51.42 ml/m LA Biplane Vol: 80.9 ml 49.00 ml/m  AORTIC VALVE AV Area (Vmax):    0.76 cm AV Area (Vmean):   0.79 cm AV Area (VTI):     0.76 cm AV Vmax:           387.00 cm/s AV Vmean:          256.333 cm/s  AV VTI:            0.753 m AV Peak Grad:      59.9 mmHg AV Mean Grad:      33.0 mmHg LVOT Vmax:         93.90 cm/s LVOT Vmean:        64.700 cm/s LVOT VTI:          0.182 m LVOT/AV VTI ratio: 0.24 AI PHT:            311 msec  AORTA Ao Root diam: 3.60 cm Ao Asc diam:  3.30 cm MITRAL VALVE                TRICUSPID VALVE MV Area (PHT): 4.40 cm     TV Peak grad:   24.6 mmHg MV Area VTI:   0.31 cm     TV Vmax:        2.48 m/s MV Peak grad:  136.9 mmHg   TR Peak grad:   24.6 mmHg MV Mean grad:  92.0 mmHg    TR Vmax:        248.00 cm/s MV Vmax:       5.85 m/s MV Vmean:      458.0 cm/s   SHUNTS MR PISA:        2.26 cm    Systemic VTI:  0.18 m MR PISA Radius: 0.60 cm     Systemic Diam: 2.00 cm MV E velocity: 120.00 cm/s MV A velocity: 48.40 cm/s MV E/A ratio:  2.48 Stanly Leavens MD Electronically signed by Stanly Leavens MD Signature Date/Time: 07/11/2024/12:52:36 PM    Final    CT Angio Chest PE W/Cm &/Or Wo Cm Result Date: 07/10/2024 EXAM: CTA CHEST 07/10/2024 11:54:20 AM TECHNIQUE: CTA of the chest was performed after the administration of intravenous contrast. Multiplanar reformatted images are provided for review. MIP images are provided for review. Automated exposure  control, iterative reconstruction, and/or weight based adjustment of the mA/kV was utilized to reduce the radiation dose to as low as reasonably achievable. COMPARISON: None available. CLINICAL HISTORY: Pulmonary embolism (PE) suspected, high prob. Per triage notes: Patient reports sob x 1 week. Worse with lying down. Complains of wheezing. Hx of hepatocellular carcinoma. No leg swelling. Denies CP. Family reports his color is off. Patient is labored and tachypneic in triage. FINDINGS: PULMONARY ARTERIES: Pulmonary arteries are adequately opacified for evaluation. No signs of acute pulmonary embolus to the distal segmental level. Early timing of the contrast bolus noted. MEDIASTINUM: The heart size is enlarged. No pericardial effusion. Aortic atherosclerosis. Coronary artery calcifications. LYMPH NODES: No enlarged mediastinal or hilar lymph nodes. LUNGS AND PLEURA: Bilateral pleural effusions: moderate on the right and small on the left. Interlobular septal thickening identified with diffuse ground-glass attenuation compatible with pulmonary edema. Mild emphysema with diffuse bronchial wall thickening. Subsegmental atelectasis noted within the lingula and right middle lobe. UPPER ABDOMEN: Low attenuation lesion within the periphery of the right lobe of liver is again noted on axial image 167/5 corresponding to previously characterized suspicious for recurrent hepatoma or metastatic disease. No acute findings within the imaged portions of the upper abdomen. Bilateral renal calcifications. SOFT TISSUES AND BONES: Left-sided gynecomastia. IMPRESSION: 1. No evidence of pulmonary embolism to the distal segmental level. Early timing of the contrast bolus noted. 2. Findings compatible with pulmonary edema, including interlobular septal thickening and diffuse ground-glass attenuation. 3. Moderate right and small left pleural effusions. 4. Enlarged heart size with aortic atherosclerosis and coronary artery  calcifications.  No pericardial effusion. 5. Low attenuation lesion within the periphery of the right lobe of liver, suspicious for recurrent hepatoma or metastatic disease. Electronically signed by: Waddell Calk MD 07/10/2024 12:16 PM EDT RP Workstation: [id number]   DG Chest 2 View Result Date: 07/10/2024 CLINICAL DATA:  Shortness of breath for the past week. EXAM: CHEST - 2 VIEW COMPARISON:  12/08/2023 FINDINGS: Normal-sized heart with a mild increase in size. Interval diffuse prominence of the pulmonary vasculature and slight interstitial prominence with Kerley lines. Interval small left pleural effusion and patchy airspace opacity in the right lower lobe. Mild left lower lobe atelectasis. Mild peribronchial thickening. Unremarkable bones. IMPRESSION: 1. Interval patchy airspace opacity in the right lower lobe, suspicious for pneumonia. 2. Interval mild congestive heart failure. 3. Interval small left pleural effusion and mild left lower lobe atelectasis. Electronically Signed   By: Elspeth Bathe M.D.   On: 07/10/2024 11:23   (Echo, Carotid, EGD, Colonoscopy, ERCP)    Subjective: Patient seen in the morning rounds.  Wife at the bedside.  Patient himself is able to lay flat today in the bed.  Able to do minimal activities without short of breath.  Discussed about echocardiogram findings, cardiology recommendations and they feel comfortable with plan to go home.   Discharge Exam: Vitals:   07/11/24 2024 07/12/24 0515  BP: 128/76 122/62  Pulse: 72 73  Resp: 16 16  Temp: (!) 97.5 F (36.4 C) 98.5 F (36.9 C)  SpO2: 98% 100%   Vitals:   07/11/24 1342 07/11/24 2024 07/12/24 0500 07/12/24 0515  BP: 127/64 128/76  122/62  Pulse: 71 72  73  Resp: 20 16  16   Temp: (!) 97.3 F (36.3 C) (!) 97.5 F (36.4 C)  98.5 F (36.9 C)  TempSrc: Oral Oral  Oral  SpO2: 97% 98%  100%  Weight:   56.2 kg   Height:        General: Pt is alert, awake, not in acute distress.  Thin. Cardiovascular: RRR, S1/S2 +, no rubs,  no gallops Respiratory: CTA bilaterally, no wheezing, no rhonchi Abdominal: Soft, NT, ND, bowel sounds + Extremities: no edema, no cyanosis    The results of significant diagnostics from this hospitalization (including imaging, microbiology, ancillary and laboratory) are listed below for reference.     Microbiology: Recent Results (from the past 240 hours)  Resp panel by RT-PCR (RSV, Flu A&B, Covid) Anterior Nasal Swab     Status: None   Collection Time: 07/10/24 11:22 AM   Specimen: Anterior Nasal Swab  Result Value Ref Range Status   SARS Coronavirus 2 by RT PCR NEGATIVE NEGATIVE Final    Comment: (NOTE) SARS-CoV-2 target nucleic acids are NOT DETECTED.  The SARS-CoV-2 RNA is generally detectable in upper respiratory specimens during the acute phase of infection. The lowest concentration of SARS-CoV-2 viral copies this assay can detect is 138 copies/mL. A negative result does not preclude SARS-Cov-2 infection and should not be used as the sole basis for treatment or other patient management decisions. A negative result may occur with  improper specimen collection/handling, submission of specimen other than nasopharyngeal swab, presence of viral mutation(s) within the areas targeted by this assay, and inadequate number of viral copies(<138 copies/mL). A negative result must be combined with clinical observations, patient history, and epidemiological information. The expected result is Negative.  Fact Sheet for Patients:  BloggerCourse.com  Fact Sheet for Healthcare Providers:  SeriousBroker.it  This test is no t yet approved or cleared by  the [place]  FDA and  has been authorized for detection and/or diagnosis of SARS-CoV-2 by FDA under an Emergency Use Authorization (EUA). This EUA will remain  in effect (meaning this test can be used) for the duration of the COVID-19 declaration under Section 564(b)(1) of the Act,  21 U.S.C.section 360bbb-3(b)(1), unless the authorization is terminated  or revoked sooner.       Influenza A by PCR NEGATIVE NEGATIVE Final   Influenza B by PCR NEGATIVE NEGATIVE Final    Comment: (NOTE) The Xpert Xpress SARS-CoV-2/FLU/RSV plus assay is intended as an aid in the diagnosis of influenza from Nasopharyngeal swab specimens and should not be used as a sole basis for treatment. Nasal washings and aspirates are unacceptable for Xpert Xpress SARS-CoV-2/FLU/RSV testing.  Fact Sheet for Patients: BloggerCourse.com  Fact Sheet for Healthcare Providers: SeriousBroker.it  This test is not yet approved or cleared by the [place]  FDA and has been authorized for detection and/or diagnosis of SARS-CoV-2 by FDA under an Emergency Use Authorization (EUA). This EUA will remain in effect (meaning this test can be used) for the duration of the COVID-19 declaration under Section 564(b)(1) of the Act, 21 U.S.C. section 360bbb-3(b)(1), unless the authorization is terminated or revoked.     Resp Syncytial Virus by PCR NEGATIVE NEGATIVE Final    Comment: (NOTE) Fact Sheet for Patients: BloggerCourse.com  Fact Sheet for Healthcare Providers: SeriousBroker.it  This test is not yet approved or cleared by the [place]  FDA and has been authorized for detection and/or diagnosis of SARS-CoV-2 by FDA under an Emergency Use Authorization (EUA). This EUA will remain in effect (meaning this test can be used) for the duration of the COVID-19 declaration under Section 564(b)(1) of the Act, 21 U.S.C. section 360bbb-3(b)(1), unless the authorization is terminated or revoked.  Performed at [facility], [address] [place], [place] [address]      Labs: BNP (last 3 results) No results for input(s): BNP in the last 8760 hours. Basic Metabolic Panel: Recent  Labs  Lab 07/10/24 1026 07/10/24 1131 07/11/24 0528 07/12/24 0507  NA 145 147* 144 145  K 3.0* 2.9* 3.1* 3.4*  CL 113* 112* 110 109  CO2 20*  --  21* 23  GLUCOSE 97 104* 90 101*  BUN 12 13 16 18   CREATININE 1.13 1.20 1.17 1.27*  CALCIUM  8.9  --  8.7* 9.1   Liver Function Tests: Recent Labs  Lab 07/10/24 1026  AST 206*  ALT 146*  ALKPHOS 889*  BILITOT 1.1  PROT 6.7  ALBUMIN 3.4*   No results for input(s): LIPASE, AMYLASE in the last 168 hours. No results for input(s): AMMONIA in the last 168 hours. CBC: Recent Labs  Lab 07/10/24 1026 07/10/24 1131 07/11/24 0528  WBC 3.2*  --  3.0*  NEUTROABS 1.8  --   --   HGB 13.6 13.9 13.7  HCT 41.3 41.0 42.5  MCV 97.2  --  98.6  PLT 148*  --  133*   Cardiac Enzymes: No results for input(s): CKTOTAL, CKMB, CKMBINDEX, TROPONINI in the last 168 hours. BNP: Invalid input(s): POCBNP CBG: No results for input(s): GLUCAP in the last 168 hours. D-Dimer No results for input(s): DDIMER in the last 72 hours. Hgb A1c No results for input(s): HGBA1C in the last 72 hours. Lipid Profile No results for input(s): CHOL, HDL, LDLCALC, TRIG, CHOLHDL, LDLDIRECT in the last 72 hours. Thyroid  function studies No results for input(s): TSH, T4TOTAL, T3FREE, THYROIDAB in the last  72 hours.  Invalid input(s): FREET3 Anemia work up No results for input(s): VITAMINB12, FOLATE, FERRITIN, TIBC, IRON, RETICCTPCT in the last 72 hours. Urinalysis No results found for: COLORURINE, APPEARANCEUR, LABSPEC, PHURINE, GLUCOSEU, HGBUR, BILIRUBINUR, KETONESUR, PROTEINUR, UROBILINOGEN, NITRITE, LEUKOCYTESUR Sepsis Labs Recent Labs  Lab 07/10/24 1026 07/11/24 0528  WBC 3.2* 3.0*   Microbiology Recent Results (from the past 240 hours)  Resp panel by RT-PCR (RSV, Flu A&B, Covid) Anterior Nasal Swab     Status: None   Collection Time: 07/10/24 11:22 AM   Specimen: Anterior Nasal  Swab  Result Value Ref Range Status   SARS Coronavirus 2 by RT PCR NEGATIVE NEGATIVE Final    Comment: (NOTE) SARS-CoV-2 target nucleic acids are NOT DETECTED.  The SARS-CoV-2 RNA is generally detectable in upper respiratory specimens during the acute phase of infection. The lowest concentration of SARS-CoV-2 viral copies this assay can detect is 138 copies/mL. A negative result does not preclude SARS-Cov-2 infection and should not be used as the sole basis for treatment or other patient management decisions. A negative result may occur with  improper specimen collection/handling, submission of specimen other than nasopharyngeal swab, presence of viral mutation(s) within the areas targeted by this assay, and inadequate number of viral copies(<138 copies/mL). A negative result must be combined with clinical observations, patient history, and epidemiological information. The expected result is Negative.  Fact Sheet for Patients:  BloggerCourse.com  Fact Sheet for Healthcare Providers:  SeriousBroker.it  This test is no t yet approved or cleared by the [place]  FDA and  has been authorized for detection and/or diagnosis of SARS-CoV-2 by FDA under an Emergency Use Authorization (EUA). This EUA will remain  in effect (meaning this test can be used) for the duration of the COVID-19 declaration under Section 564(b)(1) of the Act, 21 U.S.C.section 360bbb-3(b)(1), unless the authorization is terminated  or revoked sooner.       Influenza A by PCR NEGATIVE NEGATIVE Final   Influenza B by PCR NEGATIVE NEGATIVE Final    Comment: (NOTE) The Xpert Xpress SARS-CoV-2/FLU/RSV plus assay is intended as an aid in the diagnosis of influenza from Nasopharyngeal swab specimens and should not be used as a sole basis for treatment. Nasal washings and aspirates are unacceptable for Xpert Xpress SARS-CoV-2/FLU/RSV testing.  Fact Sheet for  Patients: BloggerCourse.com  Fact Sheet for Healthcare Providers: SeriousBroker.it  This test is not yet approved or cleared by the [place]  FDA and has been authorized for detection and/or diagnosis of SARS-CoV-2 by FDA under an Emergency Use Authorization (EUA). This EUA will remain in effect (meaning this test can be used) for the duration of the COVID-19 declaration under Section 564(b)(1) of the Act, 21 U.S.C. section 360bbb-3(b)(1), unless the authorization is terminated or revoked.     Resp Syncytial Virus by PCR NEGATIVE NEGATIVE Final    Comment: (NOTE) Fact Sheet for Patients: BloggerCourse.com  Fact Sheet for Healthcare Providers: SeriousBroker.it  This test is not yet approved or cleared by the [place]  FDA and has been authorized for detection and/or diagnosis of SARS-CoV-2 by FDA under an Emergency Use Authorization (EUA). This EUA will remain in effect (meaning this test can be used) for the duration of the COVID-19 declaration under Section 564(b)(1) of the Act, 21 U.S.C. section 360bbb-3(b)(1), unless the authorization is terminated or revoked.  Performed at [facility], [address], [place], [place] [address]      Time coordinating discharge: 35 minutes  SIGNED:   Renato Applebaum,  MD  [facility] 07/12/2024, 11:25 AM

## 2024-07-12 NOTE — Plan of Care (Signed)

## 2024-07-12 NOTE — Progress Notes (Signed)
 At this time of discharge, the patient is alert and oriented to person, place, time and situation. Patient vital signs are stable and he has no complaints, nor shows any signs or symptoms of distress or complications. Patient was taken to the main entrance via wheelchair by Rosina Alstrom, RN. Patient to be transported home by his wife via personal vehicle.   Leonor FORBES Clarks, BSN, RN 07/12/24 1:44 PM

## 2024-07-12 NOTE — Assessment & Plan Note (Signed)
 diagnosed 11/2019 by MRI for f/u of cirrhosis. S/p microwave ablation on 03/01/20. -MRI 06/01/22 showed suspicion for local recurrence and metastasis at L5. Biopsy of L5 confirmed metastatic HCC. S/p repeat microwave ablation on 07/17/22. -started lenvima  08/16/22 -staging CT CAP and bone scan on 09/19/22 showed: interval enlargement of right hepatic lobe HCC; new retropulsed bony fragment at L5; no other new lesions. His previous CT was in 05/2022 and he did not start Lenvima  until 07/2022, so it's hard to evaluate if he is responding to Lenvima  or not, the progression is likely the nature course of his [facility]  -Restaging CT scan from January 02, 2023 showed slightly improved previously treated liver lesion, and additional area of concern in the liver which are difficult to compare with previous scan due to different timing of contrast.  No other evidence of disease progression.  Plan to obtain MRI as next scan.  I personally reviewed the CT scan images and discussed the findings with patient -Will continue Lenvima , he is tolerating well overall. -Due to his weight being above 60 kg now, I increased his Lenvima  to 12 mg daily in Feb 2024 -repeated liver MRI on 04/23/2023 showed stable treated liver lesion, L5 bone mets has slightly increased in size.  Patient developed worsening low back pain in July 2024, repeated lumbar MRI showed significant disease progression in the L5 bone lesion.  I personally reviewed the scan images and discussed the findings with patient. -I recommend palliative radiation to his L5 lesion for pain control -I recommend changing systemic treatment to immunotherapy, I discussed option of atezolizumab and bevacizumab, durvalumab  and Imjudo , nivo and ipi etc. since he previously progressed on oral TKI, I recommend him to proceed with durvalumab  and Imjudo . He started on 06/30/23 -the goal of therapy is curative/palliative to prolong life and prevent/improve cancer related symptoms. -he underwent  Bilateral laminotomies L5-S1 with decompression of the spinal canal and the L5 nerve roots epidural tumor by Dr. Colon On 11/20/2023 -he received SBRT to low back in Feb 2025 -CT and abdominal MRI showed new enhancing lesion posteriorly in segment 7, suspicious for recurrence/metastasis, otherwise stable treated liver lesions and bone mets.  I discussed option of ablation versus SBRT radiation.  Patient prefers SBRT. -due to worsening LFTs, I stopped Durvalumab  in May 2025  -I referred him back to Dr. Patrcia and plan to change systemic therapy to carbozentinib when his LFT improves  -He started SBRT to new oligo liver met on 05/05/2024 and finished on 05/17/24 -He started ramping dose of cabozantinib on May 24, 2024.

## 2024-07-12 NOTE — TOC Transition Note (Signed)
 Transition of Care [facility]) - Discharge Note  Patient Details  Name: Corey Stites Sr. MRN: [id number] Date of Birth: [birth year]  [facility] CM/SW Contact:  Duwaine GORMAN Aran, LCSW Phone Number: 07/12/2024, 8:34 AM  Clinical Narrative: Care management consulted for CHF screening. Heart failure navigation team has been consulted, so care management will clear consult at this time.  Final next level of care: Home/Self Care Barriers to Discharge: No Barriers Identified  Patient Goals and CMS Choice Patient states their goals for this hospitalization and ongoing recovery are:: Home Choice offered to / list presented to : NA  Discharge Plan and Services Additional resources added to the After Visit Summary for         DME Arranged: N/A DME Agency: NA  Social Drivers of Health (SDOH) Interventions SDOH Screenings   Food Insecurity: No Food Insecurity (07/10/2024)  Housing: High Risk (07/10/2024)  Transportation Needs: No Transportation Needs (07/10/2024)  Utilities: Not At Risk (07/10/2024)  Alcohol Screen: Low Risk  (04/19/2024)  Depression (PHQ2-9): Low Risk  (06/01/2024)  Financial Resource Strain: Low Risk  (05/06/2024)  Physical Activity: Sufficiently Active (05/06/2024)  Social Connections: Moderately Integrated (07/10/2024)  Stress: Stress Concern Present (05/06/2024)  Tobacco Use: Medium Risk (07/10/2024)   Readmission Risk Interventions     No data to display

## 2024-07-13 ENCOUNTER — Other Ambulatory Visit: Payer: Self-pay

## 2024-07-13 ENCOUNTER — Inpatient Hospital Stay (HOSPITAL_BASED_OUTPATIENT_CLINIC_OR_DEPARTMENT_OTHER): Admitting: Hematology

## 2024-07-13 ENCOUNTER — Telehealth: Payer: Self-pay

## 2024-07-13 ENCOUNTER — Other Ambulatory Visit (HOSPITAL_COMMUNITY): Payer: Self-pay

## 2024-07-13 ENCOUNTER — Encounter (HOSPITAL_BASED_OUTPATIENT_CLINIC_OR_DEPARTMENT_OTHER): Payer: Self-pay | Admitting: Family

## 2024-07-13 ENCOUNTER — Ambulatory Visit (HOSPITAL_BASED_OUTPATIENT_CLINIC_OR_DEPARTMENT_OTHER): Admitting: Family

## 2024-07-13 ENCOUNTER — Inpatient Hospital Stay: Attending: Hematology

## 2024-07-13 ENCOUNTER — Other Ambulatory Visit: Payer: Self-pay | Admitting: Pharmacy Technician

## 2024-07-13 ENCOUNTER — Encounter: Payer: Self-pay | Admitting: Hematology

## 2024-07-13 VITALS — BP 120/54 | HR 90 | Temp 98.0°F | Resp 17 | Ht 67.0 in | Wt 128.8 lb

## 2024-07-13 VITALS — BP 126/62 | HR 64 | Ht 67.0 in | Wt 124.0 lb

## 2024-07-13 DIAGNOSIS — I509 Heart failure, unspecified: Secondary | ICD-10-CM | POA: Diagnosis not present

## 2024-07-13 DIAGNOSIS — R7401 Elevation of levels of liver transaminase levels: Secondary | ICD-10-CM | POA: Diagnosis not present

## 2024-07-13 DIAGNOSIS — I110 Hypertensive heart disease with heart failure: Secondary | ICD-10-CM | POA: Insufficient documentation

## 2024-07-13 DIAGNOSIS — I350 Nonrheumatic aortic (valve) stenosis: Secondary | ICD-10-CM | POA: Diagnosis not present

## 2024-07-13 DIAGNOSIS — C220 Liver cell carcinoma: Secondary | ICD-10-CM | POA: Diagnosis present

## 2024-07-13 DIAGNOSIS — C7951 Secondary malignant neoplasm of bone: Secondary | ICD-10-CM | POA: Diagnosis not present

## 2024-07-13 DIAGNOSIS — E876 Hypokalemia: Secondary | ICD-10-CM | POA: Diagnosis not present

## 2024-07-13 DIAGNOSIS — I5020 Unspecified systolic (congestive) heart failure: Secondary | ICD-10-CM

## 2024-07-13 DIAGNOSIS — I10 Essential (primary) hypertension: Secondary | ICD-10-CM | POA: Diagnosis not present

## 2024-07-13 DIAGNOSIS — Z79899 Other long term (current) drug therapy: Secondary | ICD-10-CM | POA: Insufficient documentation

## 2024-07-13 DIAGNOSIS — I080 Rheumatic disorders of both mitral and aortic valves: Secondary | ICD-10-CM | POA: Insufficient documentation

## 2024-07-13 LAB — CMP (CANCER CENTER ONLY)
ALT: 106 U/L — ABNORMAL HIGH (ref 0–44)
AST: 140 U/L — ABNORMAL HIGH (ref 15–41)
Albumin: 3.5 g/dL (ref 3.5–5.0)
Alkaline Phosphatase: 823 U/L — ABNORMAL HIGH (ref 38–126)
Anion gap: 7 (ref 5–15)
BUN: 23 mg/dL (ref 8–23)
CO2: 25 mmol/L (ref 22–32)
Calcium: 9 mg/dL (ref 8.9–10.3)
Chloride: 111 mmol/L (ref 98–111)
Creatinine: 1.09 mg/dL (ref 0.61–1.24)
GFR, Estimated: >60 mL/min (ref >60)
Glucose, Bld: 113 mg/dL — ABNORMAL HIGH (ref 70–99)
Potassium: 3.9 mmol/L (ref 3.5–5.1)
Sodium: 143 mmol/L (ref 135–145)
Total Bilirubin: 1 mg/dL (ref 0.0–1.2)
Total Protein: 7.1 g/dL (ref 6.5–8.1)

## 2024-07-13 LAB — CBC WITH DIFFERENTIAL (CANCER CENTER ONLY)
Abs Immature Granulocytes: 0.01 K/uL (ref 0.00–0.07)
Basophils Absolute: 0 K/uL (ref 0.0–0.1)
Basophils Relative: 1 %
Eosinophils Absolute: 0.1 K/uL (ref 0.0–0.5)
Eosinophils Relative: 3 %
HCT: 42.4 % (ref 39.0–52.0)
Hemoglobin: 14.2 g/dL (ref 13.0–17.0)
Immature Granulocytes: 0 %
Lymphocytes Relative: 28 %
Lymphs Abs: 0.9 K/uL (ref 0.7–4.0)
MCH: 32.5 pg (ref 26.0–34.0)
MCHC: 33.5 g/dL (ref 30.0–36.0)
MCV: 97 fL (ref 80.0–100.0)
Monocytes Absolute: 0.5 K/uL (ref 0.1–1.0)
Monocytes Relative: 16 %
Neutro Abs: 1.6 K/uL — ABNORMAL LOW (ref 1.7–7.7)
Neutrophils Relative %: 52 %
Platelet Count: 140 K/uL — ABNORMAL LOW (ref 150–400)
RBC: 4.37 MIL/uL (ref 4.22–5.81)
RDW: 17.1 % — ABNORMAL HIGH (ref 11.5–15.5)
WBC Count: 3 K/uL — ABNORMAL LOW (ref 4.0–10.5)
nRBC: 0 % (ref 0.0–0.2)

## 2024-07-13 MED ORDER — FUROSCIX 80 MG/10ML ~~LOC~~ CTKT
80.0000 mg | CARTRIDGE | SUBCUTANEOUS | 0 refills | Status: DC | PRN
  Filled 2024-07-13: qty 2, fill #0

## 2024-07-13 MED ORDER — CABOMETYX 60 MG PO TABS: 60.0000 mg | ORAL_TABLET | Freq: Every day | ORAL | 1 refills | Status: DC

## 2024-07-13 NOTE — Transitions of Care (Post Inpatient/ED Visit) (Signed)
   07/13/2024  Name: Corey Tarri Sierra Sr. MRN: [id number] DOB: Jun 29, 1956  Today's TOC FU Call Status: Today's TOC FU Call Status:: Unsuccessful Call (1st Attempt) Unsuccessful Call (1st Attempt) Date: 07/13/24  Attempted to reach the patient regarding the most recent Inpatient/ED visit.  Follow Up Plan: Additional outreach attempts will be made to reach the patient to complete the Transitions of Care (Post Inpatient/ED visit) call.   Arvin Seip RN, BSN, CCM [employer], Population Health Case Manager Phone: [phone]

## 2024-07-13 NOTE — Progress Notes (Signed)
 Cardiology Office Note:  .   Date:  07/13/2024  ID:  Corey Tarri Sierra Sr., DOB May 21, 1956, MRN [id number] PCP: [name], MD  [facility] Providers Cardiologist:  Annabella Scarce, MD    History of Present Illness: .   Corey Malena Sr. is a 68 y.o. 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 Thayer office for potential surgical fixation of pathological L5 fracture.Seen in consult 11/19/2023 due to severe aortic stenosis.  Noted murmur since childhood.  Due to hypotension his losartan -HCTZ was discontinued.  Echocardiogram 11/19/2023 LVEF 60 to 65%, no RWMA, grade 2 diastolic dysfunction, severe aortic stenosis.  Given metastatic HCC he was not candidate for TAVR or SAVR.  He underwent bilateral laminotomies L5-S1 with decompression of the spinal canal and L5 nerve roots epidural tumor by Dr. Colon 11/20/2023.  Admitted 1/27 - 12/01/2023 with concern for possible strokelike symptoms including dizziness, difficulty word finding.  CT MRI with no acute stroke.  He was admitted for TIA workup.  Symptoms and finally resolved.  He was started on atorvastatin .  Seen in follow-up 12/23/2023 with BP initially 110/58 with repeat 130/62.  Discussed BP goal less than 140/90 given severe aortic stenosis, confirmed with Dr. Scarce.  He was given hydralazine  10 mg to take as needed for SBP greater than 170.  Admitted 8/30-07/12/24 after presenting with DOE, orthopnea, wheeze. CXR and CT with interstitial fluid in lungs, no PE. ProBNP 7746 and diuresed. Echo LVEF 30-35%, severe aortic stenosis. ProBNP improved to 6052 prior to discharge. Seen by Dr. Francyne in consult, poor candidate for TAVR/SVAR due to advanced hepatocellular cancer as he had been told 2 years prior likely 3 year survival with liver cancer.  Presents today for follow up with his  wife. BP at home 140s prior to medications. He has needed PRN Hydralazine  for SBP >170 rarely. His breathing is much improved since hospital discharge. We reviewed echo results as well as pathophysiology/symptoms of aortic stenosis and heart failure. Reports prior to admission abdomen was swollen, has improved. No LE edema. He continues to have exertional dyspnea but improved. No wheeze, orthopnea, PND, chest pain, lightheadedness. Initiated discussion regarding palliative care. He continues to work.   ROS: Please see the history of present illness.    All other systems reviewed and are negative.   Studies Reviewed: .        Cardiac Studies & Procedures   ______________________________________________________________________________________________     ECHOCARDIOGRAM  ECHOCARDIOGRAM COMPLETE 07/11/2024  Narrative ECHOCARDIOGRAM REPORT    Patient Name:   Corey Powell Sr. Date of Exam: 07/11/2024 Medical Rec #:  [id number]                  Height:       67.0 in Accession #:    [id number]                 Weight:       124.1 lb Date of Birth:  1956-03-29                   BSA:          1.651 m Patient Age:    68 years                   BP:           118/63 mmHg Patient  Gender: M                          HR:           71 bpm. Exam Location:  Inpatient  Procedure: 2D Echo, 3D Echo, Cardiac Doppler, Color Doppler and Strain Analysis (Both Spectral and Color Flow Doppler were utilized during procedure).  Indications:    CHF  History:        Patient has prior history of Echocardiogram examinations, most recent 11/19/2023. TIA, Aortic Valve Disease; Risk Factors:Hypertension.  Sonographer:    Therisa Crouch Referring Phys: [id number] MIR M Hughes [facility]  IMPRESSIONS   1. LVEDV, 3D, 188 mL. Apical sparring pattern on strain polar map; in conjunction with aortic valve disease, can be seen in ATTR-CA. Left ventricular ejection fraction, by estimation, is 30 to 35%. Left ventricular  ejection fraction by 3D volume is 41 %. The left ventricle has moderately decreased function. The left ventricle demonstrates global hypokinesis. The left ventricular internal cavity size was dilated. There is mild concentric left ventricular hypertrophy. Left ventricular diastolic parameters are indeterminate. The average left ventricular global longitudinal strain is -10.7 %. The global longitudinal strain is abnormal. 2. Right ventricular systolic function is mildly reduced. The right ventricular size is normal. There is normal pulmonary artery systolic pressure. 3. Left atrial size was severely dilated. 4. Right atrial size was mildly dilated. 5. The mitral valve is grossly normal. Severe mitral valve regurgitation. No evidence of mitral stenosis. 6. There appear to be multiple jets vs eccentric flow; best seen in AC3 view. DVI 0.24. The aortic valve is calcified. Aortic valve regurgitation is moderate. Severe aortic valve stenosis. Aortic valve area, by VTI measures 0.76 cm. Aortic valve mean gradient measures 33.0 mmHg. Aortic valve Vmax measures 3.87 m/s. 7. The inferior vena cava is normal in size with <50% respiratory variability, suggesting right atrial pressure of 8 mmHg.  Comparison(s): Prior images reviewed side by side. Internal progression of disease with worsening ventricular function and mitral regurgitation.  FINDINGS Left Ventricle: LVEDV, 3D, 188 mL. Apical sparring pattern on strain polar map; in conjunction with aortic valve disease, can be seen in ATTR-CA. Left ventricular ejection fraction, by estimation, is 30 to 35%. Left ventricular ejection fraction by 3D volume is 41 %. The left ventricle has moderately decreased function. The left ventricle demonstrates global hypokinesis. The average left ventricular global longitudinal strain is -10.7 %. Strain was performed and the global longitudinal strain is abnormal. The left ventricular internal cavity size was dilated. There is  mild concentric left ventricular hypertrophy. Left ventricular diastolic parameters are indeterminate.  Right Ventricle: The right ventricular size is normal. No increase in right ventricular wall thickness. Right ventricular systolic function is mildly reduced. There is normal pulmonary artery systolic pressure. The tricuspid regurgitant velocity is 2.48 m/s, and with an assumed right atrial pressure of 8 mmHg, the estimated right ventricular systolic pressure is 32.6 mmHg.  Left Atrium: Left atrial size was severely dilated.  Right Atrium: Right atrial size was mildly dilated.  Pericardium: Trivial pericardial effusion is present. The pericardial effusion is posterior to the left ventricle.  Mitral Valve: The mitral valve is grossly normal. Severe mitral valve regurgitation. No evidence of mitral valve stenosis. MV peak gradient, 136.9 mmHg. The mean mitral valve gradient is 92.0 mmHg.  Tricuspid Valve: The tricuspid valve is normal in structure. Tricuspid valve regurgitation is not demonstrated. No evidence of tricuspid stenosis.  Aortic Valve: There appear  to be multiple jets vs eccentric flow; best seen in AC3 view. DVI 0.24. The aortic valve is calcified. Aortic valve regurgitation is moderate. Aortic regurgitation PHT measures 311 msec. Severe aortic stenosis is present. Aortic valve mean gradient measures 33.0 mmHg. Aortic valve peak gradient measures 59.9 mmHg. Aortic valve area, by VTI measures 0.76 cm.  Pulmonic Valve: The pulmonic valve was normal in structure. Pulmonic valve regurgitation is trivial. No evidence of pulmonic stenosis.  Aorta: The aortic root and ascending aorta are structurally normal, with no evidence of dilitation.  Venous: The inferior vena cava is normal in size with less than 50% respiratory variability, suggesting right atrial pressure of 8 mmHg.  IAS/Shunts: No atrial level shunt detected by color flow Doppler.  Additional Comments: 3D was performed not  requiring image post processing on an independent workstation and was abnormal.   LEFT VENTRICLE PLAX 2D LVIDd:         5.70 cm         Diastology LVIDs:         4.40 cm         LV e' medial:    4.74 cm/s LV PW:         1.00 cm         LV E/e' medial:  25.3 LV IVS:        1.00 cm         LV e' lateral:   6.25 cm/s LVOT diam:     2.00 cm         LV E/e' lateral: 19.2 LV SV:         57 LV SV Index:   35              2D Longitudinal LVOT Area:     3.14 cm        Strain 2D Strain GLS   -10.7 % Avg: LV Volumes (MOD) LV vol d, MOD    180.0 ml      3D Volume EF A2C:                           LV 3D EF:    Left LV vol d, MOD    172.0 ml                   ventricul A4C:                                        ar LV vol s, MOD    113.0 ml                   ejection A2C:                                        fraction LV vol s, MOD    102.0 ml                   by 3D A4C:                                        volume is LV SV MOD A2C:   67.0 ml  41 %. LV SV MOD A4C:   172.0 ml      LV 3D EDV:   188.00 ml LV SV MOD BP:    70.2 ml       LV 3D ESV:   111.36 ml  3D Volume EF: 3D EF:        41 %  RIGHT VENTRICLE            IVC RV Basal diam:  3.30 cm    IVC diam: 1.90 cm RV S prime:     8.18 cm/s TAPSE (M-mode): 1.5 cm  LEFT ATRIUM             Index        RIGHT ATRIUM           Index LA diam:        3.70 cm 2.24 cm/m   RA Area:     18.40 cm LA Vol (A2C):   71.1 ml 43.06 ml/m  RA Volume:   53.00 ml  32.10 ml/m LA Vol (A4C):   84.9 ml 51.42 ml/m LA Biplane Vol: 80.9 ml 49.00 ml/m AORTIC VALVE AV Area (Vmax):    0.76 cm AV Area (Vmean):   0.79 cm AV Area (VTI):     0.76 cm AV Vmax:           387.00 cm/s AV Vmean:          256.333 cm/s AV VTI:            0.753 m AV Peak Grad:      59.9 mmHg AV Mean Grad:      33.0 mmHg LVOT Vmax:         93.90 cm/s LVOT Vmean:        64.700 cm/s LVOT VTI:          0.182 m LVOT/AV VTI ratio: 0.24 AI PHT:            311  msec  AORTA Ao Root diam: 3.60 cm Ao Asc diam:  3.30 cm  MITRAL VALVE                TRICUSPID VALVE MV Area (PHT): 4.40 cm     TV Peak grad:   24.6 mmHg MV Area VTI:   0.31 cm     TV Vmax:        2.48 m/s MV Peak grad:  136.9 mmHg   TR Peak grad:   24.6 mmHg MV Mean grad:  92.0 mmHg    TR Vmax:        248.00 cm/s MV Vmax:       5.85 m/s MV Vmean:      458.0 cm/s   SHUNTS MR PISA:        2.26 cm    Systemic VTI:  0.18 m MR PISA Radius: 0.60 cm     Systemic Diam: 2.00 cm MV E velocity: 120.00 cm/s MV A velocity: 48.40 cm/s MV E/A ratio:  2.48  Stanly Leavens MD Electronically signed by Stanly Leavens MD Signature Date/Time: 07/11/2024/12:52:36 PM    Final          ______________________________________________________________________________________________        Risk Assessment/Calculations:             Physical Exam:   VS:  BP 126/62   Pulse 64   Ht 5' 7 (1.702 m)   Wt 124 lb (56.2 kg)   SpO2 97%   BMI 19.42 kg/m  Wt Readings from Last 3 Encounters:  07/13/24 124 lb (56.2 kg)  07/12/24 123 lb 14.4 oz (56.2 kg)  06/22/24 131 lb 11.2 oz (59.7 kg)    Vitals:   07/13/24 1128  BP: 126/62  Pulse: 64  Height: 5' 7 (1.702 m)  Weight: 124 lb (56.2 kg)  SpO2: 97%  BMI (Calculated): 19.42    GEN: Well nourished, well developed in no acute distress NECK: No JVD; No carotid bruits CARDIAC: RRR, no  rubs, gallops. Gr3-4/6 systolic murmur LUSB RESPIRATORY:  Clear to auscultation without rales, wheezing or rhonchi  ABDOMEN: Soft, non-tender, non-distended EXTREMITIES:  No edema; No deformity   ASSESSMENT AND PLAN: .    HTN - BP well controlled. Continue current antihypertensive regimen Olmesartan  20mg  daily, Lasix  40mg  daily. Discussed to monitor BP at home at least 2 hours after medications and sitting for 5-10 minutes. Has Hydralazine  10mg  PRN on hand for SBP >170.   Severe AS / Bicuspid AV / HFrEF - Echo 06/2024 LVEF 30-35%, severe AS. Not  TAVR or SAVR candidate due to [facility].  Previously discussed screening of first-degree relatives with echocardiogram.  We discussed if he is given longer life expectancy than 1 year (2 years ago was told 3 year life expectancy with liver cancer) could consider referral to Valve Team. Prognosis with AS/CHF approx 12 mos, at most 24 mos without AVR. However, we discussed that if surgical intervention pursued would likely mean significant burden of scans/appointments likely without significant benefit.  NYHA III after recent diuresis. GDMT furosemide  40mg  daily, potassium 40meq x 3 days then 20meq daily, Olmesartan  20mg  daily. Will prioritize loop diuretic over MRA at this time for symptom management.  Appreciate oncology team assistance in adding on plasma magnesium today ProBNP, CBC, magnesium, BMET in 1 week to reassess volume status, renal function, electrolytes.  Rx Furoscix  80mg  SQ PRN as directed by cardiology. Will provide prescription for 2 doses of Furoscix  to keep on hand in event of volume overload.  Educated to contact our office for weight gain of 2 lbs overnight or 5 lbs in one week.  Addendum 07/15/24: Per oncology team his life expectancy from cancer standpoint is likely >1 year. 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 Will refer to structural heart team for consult.   [facility] -Per oncology team. Present plan for repeat scan approx mid-October after 3 months of cabozantinib. Has visit later today, given new onset CHF would consider palliative care. Discussion initiated regarding palliative and they prefer to wait until discussion with oncology team.  Allergic rhinitis - Recommend trial of Xyzal daily as Claritin ineffective. Recommend trial of Flonase one spray per nostril daily PRN for allergy symptoms.   Hx of TIA / HLD-continue aspirin  81 mg daily,  atorvastatin  40 mg daily.  No LDL  assessed since initiation of Atorvastatin , consider with next fasting labs.     Dispo: follow up in 2 months with Dr. Raford  Signed, Reche GORMAN Finder, NP

## 2024-07-13 NOTE — Patient Instructions (Addendum)
 Medication Instructions:  Continue your current medications   We have ordered Furoscix  for you to keep on hand  *If you need a refill on your cardiac medications before your next appointment, please call your pharmacy*  Lab Work: Your physician recommends that you return for lab work in 1 week: BMET, magnesium, ProBNP, CBC If you have labs (blood work) drawn today and your tests are completely normal, you will receive your results only by: MyChart Message (if you have MyChart) OR A paper copy in the mail If you have any lab test that is abnormal or we need to change your treatment, we will call you to review the results.   Follow-Up: At [facility], you and your health needs are our priority.  As part of our continuing mission to provide you with exceptional heart care, our providers are all part of one team.  This team includes your primary Cardiologist (physician) and Advanced Practice Providers or APPs (Physician Assistants and Nurse Practitioners) who all work together to provide you with the care you need, when you need it.  Your next appointment:   As scheduled with Dr. Raford  We recommend signing up for the patient portal called MyChart.  Sign up information is provided on this After Visit Summary.  MyChart is used to connect with patients for Virtual Visits (Telemedicine).  Patients are able to view lab/test results, encounter notes, upcoming appointments, etc.  Non-urgent messages can be sent to your provider as well.   To learn more about what you can do with MyChart, go to [email or web address].   Other Instructions      Recommend weighing daily and keeping a log. Please call our office if you have weight gain of 2 pounds overnight or 5 pounds in 1 week.   Date  Time Weight

## 2024-07-13 NOTE — Progress Notes (Signed)
 [facility]   Telephone:([phone]) [phone] Fax:(336) [phone]   Clinic Follow up Note   Patient Care Team: [name], MD as PCP - General (Internal Medicine) [name], MD as PCP - Cardiology (Cardiology) [name], MD as Attending Physician (Hematology and Oncology) [name], [name], NP as Nurse Practitioner (Hospice and Palliative Medicine) Imaging, [facility] as Radiologist (Diagnostic Radiology)  Date of Service:  07/13/2024  CHIEF COMPLAINT: f/u of [name]  CURRENT THERAPY: Cabozentinib 60mg  daily    Oncology History   Hepatocellular carcinoma in adult [facility]) diagnosed 11/2019 by MRI for f/u of cirrhosis. S/p microwave ablation on 03/01/20. -MRI 06/01/22 showed suspicion for local recurrence and metastasis at L5. Biopsy of L5 confirmed metastatic HCC. S/p repeat microwave ablation on 07/17/22. -started lenvima  08/16/22 -staging CT CAP and bone scan on 09/19/22 showed: interval enlargement of right hepatic lobe HCC; new retropulsed bony fragment at L5; no other new lesions. His previous CT was in 05/2022 and he did not start Lenvima  until 07/2022, so it's hard to evaluate if he is responding to Lenvima  or not, the progression is likely the nature course of his [facility]  -Restaging CT scan from January 02, 2023 showed slightly improved previously treated liver lesion, and additional area of concern in the liver which are difficult to compare with previous scan due to different timing of contrast.  No other evidence of disease progression.  Plan to obtain MRI as next scan.  I personally reviewed the CT scan images and discussed the findings with patient -Will continue Lenvima , he is tolerating well overall. -Due to his weight being above 60 kg now, I increased his Lenvima  to 12 mg daily in Feb 2024 -repeated liver MRI on 04/23/2023 showed stable treated liver lesion, L5 bone mets has slightly increased in size.  Patient developed worsening low back  pain in July 2024, repeated lumbar MRI showed significant disease progression in the L5 bone lesion.  I personally reviewed the scan images and discussed the findings with patient. -I recommend palliative radiation to his L5 lesion for pain control -I recommend changing systemic treatment to immunotherapy, I discussed option of atezolizumab and bevacizumab, durvalumab  and Imjudo , nivo and ipi etc. since he previously progressed on oral TKI, I recommend him to proceed with durvalumab  and Imjudo . He started on 06/30/23 -the goal of therapy is curative/palliative to prolong life and prevent/improve cancer related symptoms. -he underwent Bilateral laminotomies L5-S1 with decompression of the spinal canal and the L5 nerve roots epidural tumor by Dr. Colon On 11/20/2023 -he received SBRT to low back in Feb 2025 -CT and abdominal MRI showed new enhancing lesion posteriorly in segment 7, suspicious for recurrence/metastasis, otherwise stable treated liver lesions and bone mets.  I discussed option of ablation versus SBRT radiation.  Patient prefers SBRT. -due to worsening LFTs, I stopped Durvalumab  in May 2025  -I referred him back to Dr. Patrcia and plan to change systemic therapy to carbozentinib when his LFT improves  -He started SBRT to new oligo liver met on 05/05/2024 and finished on 05/17/24 -He started ramping dose of cabozantinib on May 24, 2024.  Assessment & Plan Hepatocellular carcinoma -On Cabozentinib 60mg  with side effects including rash, diarrhea, and facial leukoderma, but overall tolerating well. Liver enzymes slightly elevated, likely due to medication, but bilirubin remains normal. Recent CT chest showed no mets - Order CT abdomen and pelvis with contrast for restaging  - Monitor liver enzymes closely - Refill chemotherapy medication through [facility]  special[facility] - Consult cardiologist regarding potential chemotherapy hold if surgery is planned -His cancer survival is likely2 more than  one year   Severe aortic stenosis with congestive heart failure  Recently hospitalized for dyspnea, diagnosed with severe aortic stenosis and mitral valve insufficiency, resulting in congestive heart failure. Currently ambulates short distances before requiring rest and can now lay flat at night. Cardiologist proposed minimally invasive surgery to enhance symptoms and heart function, potentially improving chemotherapy tolerance. Prognosis exceeds one year, justifying surgical intervention. - Discuss surgical options with cardiologist - Coordinate with cardiologist for potential minimally invasive surgery - Monitor symptoms and adjust treatment as needed  Hypertension Managed with hydralazine , Lasix , and potassium. Chemotherapy may exacerbate hypertension. - Continue current antihypertensive regimen  Plan -continue Cabozentinib 60mg  daily - Schedule follow-up appointment in three weeks with lab for close monitoring of his LFTs -restaging CT AP w contrast in 1-2 weeks  - Coordinate with cardiologist for surgical planning.       SUMMARY OF ONCOLOGIC HISTORY: Oncology History  Hepatocellular carcinoma in adult [facility])  12/08/2019 Imaging   MR ABDOMEN WWO CONTRAST   IMPRESSION: Two small masses in the right hepatic lobe which have characteristics diagnostic for hepatocellular carcinoma in the setting of cirrhosis. (LI-RADS Category 5: Definitely HCC)   No evidence of abdominal metastatic disease or other significant abnormality.   03/01/2020 Initial Diagnosis   Hepatocellular carcinoma (HCC)   03/01/2020 Procedure   CT GUIDE TISSUE ABLATION     IMPRESSION: Successful percutaneous thermal ablation.   Signed,   [name], MD, RPVI   06/02/2020 Imaging   MR ABDOMEN WWO CONTRAST   IMPRESSION: 1. There has been interval ablation at two adjacent sites of the subcapsular right lobe of the liver, hepatic segment VI. There is no residual contrast enhancement at these  adjacent sites. LI-RADS category 5 TR, nonviable. 2. There are scattered, subcentimeter foci of arterial hyperenhancement of the anterior right lobe of the liver, for example adjacent to the ablation site measuring 3 mm and in the medial anterior right lobe of the liver measuring 6 mm. This larger focus is unchanged compared to prior. Others were not previously seen. These findings are nonspecific and consistent with LI-RADS category 3. Attention on follow-up.   09/02/2020 Imaging   MR ABDOMEN WWO CONTRAST     IMPRESSION: 1. Post radiofrequency ablation along the margin of the RIGHT hemi liver. Heterogeneous appearance of this area on both T1 and T2. No sign of residual enhancement. LR TR nonviable. 2. Very subtle focus of restricted diffusion in the cephalad lateral segment LEFT hepatic lobe measuring 7 mm, corresponding to low signal on image 27 of series 21. Arterial enhancement is not seen in this area and this areas not seen on prior imaging. Not clear whether this represents a new lesion as this area is obscured largely by artifact on most sequences on today's study and on previous imaging evaluations. LR category 3, consider a 3 to six-month follow-up with abdominal MRI. 3. Lesions of concern scattered about the RIGHT hepatic lobe may represent perfusional anomalies as they were not seen on today's study. 4. Stable mild dilation of the biliary tree.   11/27/2020 Imaging   MR ABDOMEN WWO CONTRAST   IMPRESSION: Minimal decrease in size of ablation defect in the peripheral anterior right hepatic lobe. No evidence of locally recurrent hepatocellular carcinoma or other suspicious liver lesions.   No evidence of metastatic disease or other acute findings.   04/03/2021 Imaging  MR ABDOMEN WWO CONTRAST   IMPRESSION: Stable ablation defect in the lateral right hepatic lobe, without evidence of recurrent tumor at this site.   5 mm hypovascular lesion in segment 5 of the  right lobe was not definitely seen on previous study, but is too small to characterize. This shows no arterial phase hyperenhancement. Recommend continued follow-up by MRI in 6 months.   No evidence of abdominal metastatic disease.        09/03/2021 Imaging   MR ABDOMEN WWO CONTRAST   IMPRESSION: 1. Side-by-side ablation sites in the right hepatic lobe in segment 6. The more anterior site remains largely cystic in appearance and no findings suspicious recurrent tumor in this area. There is however abnormal contrast enhancement around the larger more posterior and lateral ablation site worrisome for recurrent tumor. 2. Several small foci of early arterial phase enhancement in the liver. The peripheral lesions are likely vascular shunts. The more central lesions are likely dysplastic nodules. Recommend continued surveillance. 3. Stable bilateral renal cysts.   12/07/2021 Imaging   MR ABDOMEN WWO CONTRAST  IMPRESSION: 1. Ablation defects in the right lobe of the liver, similar to the prior study, without definitive evidence to suggest local recurrence of disease or metastatic disease. 2. Multiple tiny arterial phase areas of nodular hyperenhancement scattered throughout the liver, predominantly in the right lobe of the liver, without perceptible signal abnormality or diffusion restriction on other pulse sequences, nonspecific, and likely small benign areas of vascular shunting. Continued attention on follow-up imaging is recommended. 3. Multiple Bosniak class 1 and Bosniak class 2 cysts in the kidneys bilaterally, similar to the prior study, as above. 4. Aortic atherosclerosis.     06/01/2022 Imaging   MR ABDOMEN WWO CONTRAST   IMPRESSION: 1. LR TR equivocal but suspicious findings with respect to previously treated disease in the RIGHT hepatic lobe given nodular enhancement adjacent and anterior superior to the ablation zone as described. 2. Given lack of late arterial  phase due to bolus timing a second area of abnormality within the liver is suspicious for additional site of hepatocellular carcinoma showing washout appearance and capsule appearance on current imaging, cardiac pulsation limiting assessment in the area of concern. 3. Suspect bony metastatic disease in the lower lumbar spine at L5 level. Dedicated spinal imaging may be helpful for further evaluation.     06/18/2022 Imaging   MR Lumbar Spine W Wo Contrast   IMPRESSION: L5 vertebral body lesion is most consistent with a metastasis. There is probable trace ventral epidural extension   07/17/2022 Procedure   CT GUIDE TISSUE ABLATION   IMPRESSION: Successful CT-guided microwave ablation of recurrent hepatocellular carcinoma.   07/17/2022 Procedure   CT BONE TROCAR/NEEDLE BIOPSY SUPERFICIAL   IMPRESSION: Successful CT-guided core biopsy of L5 bone lesion.      07/17/2022 Pathology Results   SURGICAL PATHOLOGY  CASE: [id number]  PATIENT: Corey Armstrong  Surgical Pathology Report   SURGICAL PATHOLOGY  CASE: [id number]  PATIENT: Corey Armstrong  Surgical Pathology Report      Clinical History: Hx of cirrhosis and small HCC with unexpected  suspicious lesion at L5. Rare HCC met to bone vs atypical hemangioma or  met from new primary. (crm)      FINAL MICROSCOPIC DIAGNOSIS:   A. 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 No acute findings  within the chest. No suspicious findings for metastatic disease within the chest. 2. Unchanged 3 mm nodule within the medial right upper lobe. No new or suspicious lung nodules. 3. Suboptimal visualization of the known liver lesions, reflecting lack of IV contrast material. Similar appearance of ablation defect within the lateral right lobe of liver. 4.  Left renal calculi. 5. Coronary artery calcifications. 6.  Aortic Atherosclerosis   06/25/2023 Imaging   NM Bone Scan whole body  IMPRESSION: 1. Known L5 metastasis demonstrates low level uptake, mildly increased from previous bone scan. 2. No new lesions demonstrated.   06/30/2023 - 03/10/2024 Chemotherapy   Patient is on Treatment Plan : Hepatocellular Carcinoma Tremelimumab -actl C1 D1 + Durvalumab  q28d       Imaging        Discussed the use of AI scribe software for clinical note transcription with the patient, who gave verbal consent to proceed.  History of Present Illness Corey Rossetti Sr. is a 68 year old male with hepatocellular carcinoma who presents for follow-up after a recent hospitalization for shortness of breath.  He was recently hospitalized for shortness of breath and diagnosed with severe aortic stenosis and mitral valve issues. His breathing has improved, and he can now walk down the hall before needing to rest. He is also able to lay flat at night.  His cancer medication regimen was increased to 460 mg three weeks ago, leading to rashes on his hands, diarrhea, and white spots on his face. He ran out of the medication over the weekend and has not restarted it since discharge.  Current medications include Lasix , potassium, Protonix , hydralazine , and aspirin . He reports no current pain and uses Tylenol  for joint pain.  Recent blood work shows a slightly low white blood cell count and platelet count, with normal hemoglobin. Liver enzymes are slightly worse, but bilirubin levels remain normal. A CT scan of the chest showed an enlarged heart and liver spots. He is due for a CT of the abdomen and pelvis.     All other systems were reviewed with the patient and are negative.  MEDICAL HISTORY:  Past Medical History:  Diagnosis Date   Headache    migraines   Hepatitis    HX OF hEP c ?    hepatocellular ca with bone mets 2021   Hypertension    Murmur, heart  1963    SURGICAL HISTORY: Past Surgical History:  Procedure Laterality Date   BREAST BIOPSY Left 03/26/2024   US  LT BREAST BX W LOC DEV 1ST LESION IMG BX SPEC US  GUIDE 03/26/2024 [facility] MAMMOGRAPHY   DECOMPRESSIVE LUMBAR LAMINECTOMY LEVEL 1 N/A 11/20/2023   Procedure: lumbar laminectomy right Lumbar five with decompression of nerve roots and excision of tumor;  Surgeon: Colon Shove, MD;  Location: [facility] OR;  Service: Neurosurgery;  Laterality: N/A;   IR RADIOLOGIST EVAL & MGMT  01/25/2020   IR RADIOLOGIST EVAL & MGMT  03/21/2020   IR RADIOLOGIST EVAL & MGMT  06/07/2020   IR RADIOLOGIST EVAL & MGMT  09/05/2020   IR RADIOLOGIST EVAL & MGMT  12/05/2020   IR RADIOLOGIST EVAL & MGMT  05/09/2021   IR RADIOLOGIST EVAL & MGMT  09/06/2021   IR RADIOLOGIST EVAL & MGMT  12/12/2021   IR RADIOLOGIST EVAL & MGMT  06/25/2022   IR RADIOLOGIST EVAL & MGMT  07/24/2022   RADIOLOGY WITH ANESTHESIA N/A 03/01/2020   Procedure: CT WITH ANESTHESIA  THERMAL MICROWAVE  ABLATIION;  Surgeon: Karalee Beat, MD;  Location:  [facility] ORS;  Service: Anesthesiology;  Laterality: N/A;   RADIOLOGY WITH ANESTHESIA N/A 07/17/2022   Procedure: CT MICROWAVE ABLATION;  Surgeon: Karalee Wilkie POUR, MD;  Location: [facility] ORS;  Service: Radiology;  Laterality: N/A;    I have reviewed the social history and family history with the patient and they are unchanged from previous note.  ALLERGIES:  has no known allergies.  MEDICATIONS:  Current Outpatient Medications  Medication Sig Dispense Refill   acetaminophen  (TYLENOL ) 500 MG tablet Take 1,000 mg by mouth every 6 (six) hours as needed.     aspirin  EC 81 MG tablet Take 1 tablet (81 mg total) by mouth daily. Swallow whole. (Patient taking differently: Take 81 mg by mouth in the morning. Swallow whole.) 30 tablet 12   atorvastatin  (LIPITOR) 40 MG tablet Take 1 tablet (40 mg total) by mouth daily. (Patient taking differently: Take 40 mg by mouth in the morning.) 30 tablet 11   cabozantinib  (CABOMETYX ) 60 MG tablet Take 1 tablet (60 mg total) by mouth daily. TAKE 1 TABLET BY MOUTH 1 TIME A DAY ON AN EMPTY STOMACH, 1 HOUR BEFORE OR 2 HOURS AFTER MEALS. 30 tablet 1   Furosemide  (FUROSCIX ) 80 MG/10ML CTKT Inject 80 mg into the skin as needed (Take as directed by cardiology.). 2 each 0   furosemide  (LASIX ) 40 MG tablet Take 1 tablet (40 mg total) by mouth daily. 30 tablet 11   hydrALAZINE  (APRESOLINE ) 10 MG tablet TAKE 1 TABLET 2 (TWO) TIMES DAILY AS NEEDED. TAKE FOR SYSTOLIC BLOOD PRESSURE MORE THAN 170. (Patient taking differently: Take 10 mg by mouth 2 (two) times daily as needed (SYSTOLIC BLOOD PRESSURE MORE THAN 170).) 180 tablet 3   olmesartan  (BENICAR ) 20 MG tablet Take 1 tablet (20 mg total) by mouth daily. (Patient taking differently: Take 20 mg by mouth in the morning.) 30 tablet 11   potassium chloride  SA (KLOR-CON  M) 20 MEQ tablet Take 2 tablets (40 mEq total) by mouth daily for 7 days. 7 tablet 0   [START ON 07/20/2024] potassium chloride  SA (KLOR-CON  M) 20 MEQ tablet Take 1 tablet (20 mEq total) by mouth daily. (Patient not taking: Reported on 07/13/2024) 30 tablet 0   No current facility-administered medications for this visit.    PHYSICAL EXAMINATION: ECOG PERFORMANCE STATUS: 1 - Symptomatic but completely ambulatory  Vitals:   07/13/24 1409  BP: (!) 120/54  Pulse: 90  Resp: 17  Temp: 98 F (36.7 C)  SpO2: 99%   Wt Readings from Last 3 Encounters:  07/13/24 128 lb 12.8 oz (58.4 kg)  07/13/24 124 lb (56.2 kg)  07/12/24 123 lb 14.4 oz (56.2 kg)     GENERAL:alert, no distress and comfortable SKIN: skin color, texture, turgor are normal, no rashes or significant lesions EYES: normal, Conjunctiva are pink and non-injected, sclera clear NECK: supple, thyroid  normal size, non-tender, without nodularity LYMPH:  no palpable lymphadenopathy in the cervical, axillary  LUNGS: clear to auscultation and percussion with normal breathing effort HEART: regular rate & rhythm  and no murmurs and no lower extremity edema ABDOMEN:abdomen soft, non-tender and normal bowel sounds Musculoskeletal:no cyanosis of digits and no clubbing  NEURO: alert & oriented x 3 with fluent speech, no focal motor/sensory deficits  Physical Exam    LABORATORY DATA:  I have reviewed the data as listed    Latest Ref Rng & Units 07/13/2024    1:59 PM 07/11/2024    5:28 AM 07/10/2024   11:31 AM  CBC  WBC  4.0 - 10.5 K/uL 3.0  3.0    Hemoglobin 13.0 - 17.0 g/dL 85.7  86.2  86.0   Hematocrit 39.0 - 52.0 % 42.4  42.5  41.0   Platelets 150 - 400 K/uL 140  133          Latest Ref Rng & Units 07/13/2024    1:59 PM 07/12/2024    5:07 AM 07/11/2024    5:28 AM  CMP  Glucose 70 - 99 mg/dL 886  898  90   BUN 8 - 23 mg/dL 23  18  16    Creatinine 0.61 - 1.24 mg/dL 8.90  8.72  8.82   Sodium 135 - 145 mmol/L 143  145  144   Potassium 3.5 - 5.1 mmol/L 3.9  3.4  3.1   Chloride 98 - 111 mmol/L 111  109  110   CO2 22 - 32 mmol/L 25  23  21    Calcium  8.9 - 10.3 mg/dL 9.0  9.1  8.7   Total Protein 6.5 - 8.1 g/dL 7.1     Total Bilirubin 0.0 - 1.2 mg/dL 1.0     Alkaline Phos 38 - 126 U/L 823     AST 15 - 41 U/L 140     ALT 0 - 44 U/L 106         RADIOGRAPHIC STUDIES: I have personally reviewed the radiological images as listed and agreed with the findings in the report. No results found.    Orders Placed This Encounter  Procedures   CT ABDOMEN PELVIS W CONTRAST    Standing Status:   Future    Expected Date:   07/27/2024    Expiration Date:   07/13/2025    If indicated for the ordered procedure, I authorize the administration of contrast media per Radiology protocol:   Yes    Does the patient have a contrast media/X-ray dye allergy?:   No    Preferred imaging location?:   [facility]    If indicated for the ordered procedure, I authorize the administration of oral contrast media per Radiology protocol:   Yes   All questions were answered. The patient knows to call the clinic with  any problems, questions or concerns. No barriers to learning was detected. The total time spent in the appointment was 30 minutes, including review of chart and various tests results, discussions about plan of care and coordination of care plan     Onita Mattock, MD 07/13/2024

## 2024-07-14 ENCOUNTER — Telehealth: Payer: Self-pay

## 2024-07-14 NOTE — Transitions of Care (Post Inpatient/ED Visit) (Signed)
   07/14/2024  Name: Corey Tarri Sierra Sr. MRN: [id number] DOB: October 04, 1956  Today's TOC FU Call Status: Today's TOC FU Call Status:: Unsuccessful Call (2nd Attempt) Unsuccessful Call (1st Attempt) Date: 07/13/24 Unsuccessful Call (2nd Attempt) Date: 07/14/24  Attempted to reach the patient regarding the most recent Inpatient/ED visit.  Follow Up Plan: Additional outreach attempts will be made to reach the patient to complete the Transitions of Care (Post Inpatient/ED visit) call.   Medford Balboa, BSN, RN [place]  VBCI - [employer] [facility] RN Care Manager [phone]

## 2024-07-15 ENCOUNTER — Encounter (HOSPITAL_BASED_OUTPATIENT_CLINIC_OR_DEPARTMENT_OTHER): Payer: Self-pay

## 2024-07-15 ENCOUNTER — Telehealth: Payer: Self-pay

## 2024-07-15 NOTE — Addendum Note (Signed)
 Addended by: Prynce Jacober S on: 07/15/2024 07:46 AM   Modules accepted: Orders

## 2024-07-15 NOTE — Transitions of Care (Post Inpatient/ED Visit) (Signed)
   07/15/2024  Name: Corey Tarri Sierra Sr. MRN: [id number] DOB: 21-Dec-1955  Today's TOC FU Call Status: Today's TOC FU Call Status:: Unsuccessful Call (3rd Attempt) Unsuccessful Call (3rd Attempt) Date: 07/15/24  Attempted to reach the patient regarding the most recent Inpatient/ED visit.  Follow Up Plan: No further outreach attempts will be made at this time. We have been unable to contact the patient.  Arvin Seip RN, BSN, CCM [employer], Population Health Case Manager Phone: [phone]

## 2024-07-23 ENCOUNTER — Ambulatory Visit: Attending: Cardiovascular Disease | Admitting: Cardiovascular Disease

## 2024-07-23 ENCOUNTER — Encounter: Payer: Self-pay | Admitting: Cardiovascular Disease

## 2024-07-23 VITALS — BP 124/56 | HR 69 | Ht 67.0 in | Wt 126.8 lb

## 2024-07-23 DIAGNOSIS — I350 Nonrheumatic aortic (valve) stenosis: Secondary | ICD-10-CM

## 2024-07-23 DIAGNOSIS — Z01812 Encounter for preprocedural laboratory examination: Secondary | ICD-10-CM

## 2024-07-23 NOTE — Progress Notes (Addendum)
 Pre Surgical Assessment: 5 M Walk Test  36M=16.1ft  5 Meter Walk Test- trial 1: 4.75 seconds 5 Meter Walk Test- trial 2: 5.38 seconds 5 Meter Walk Test- trial 3: 4.40 seconds 5 Meter Walk Test Average: 4.84 seconds   __________________________  Procedure Type: Isolated AVR Perioperative Outcome Estimate % Operative Mortality 1.7% Morbidity & Mortality 9.08% Stroke 1.7% Renal Failure 1% Reoperation 5% Prolonged Ventilation 3.85% Deep Sternal Wound Infection 0.044% Long Hospital Stay (>14 days) 4.36% Short Hospital Stay (<6 days)* 42.1%

## 2024-07-23 NOTE — Assessment & Plan Note (Addendum)
 The patient appears to have severe low-flow low gradient aortic stenosis (stage D2 with low ejection fraction).  He now has new LV dysfunction and progressive heart failure symptoms as well as worsening mitral regurgitation.  Initially it had been felt that he would be treated in a palliative fashion, but he again remains functionally independent, continues to work, and is expected to have a prognosis greater than 1 year per oncology assessment.  It is appropriate to proceed with treatment to alleviate symptoms and improve quality of life.  I personally reviewed his echo images which show severe LV dysfunction with LVEF 30 to 35% and mild RV dysfunction.  He has very severe appearing aortic stenosis with heavily calcified and mobile leaflets and moderate aortic insufficiency.  His calculated aortic valve area is 0.76 cm with a mean gradient of 33 mmHg, V-max 3.9 m/s, and dimensionless index of 0.24.  He also has 3+ mitral regurgitation.  I have reviewed the natural history of aortic stenosis with the patient and their family members who are present today. We have discussed the limitations of medical therapy and the poor prognosis associated with symptomatic aortic stenosis. We have reviewed potential treatment options, including palliative medical therapy, conventional surgical aortic valve replacement, and transcatheter aortic valve replacement. We discussed treatment options in the context of the patient's specific comorbid medical conditions.  The patient clearly expresses his desire to seek treatment.  He will be scheduled for right and left heart catheterization to assess hemodynamics and coronary anatomy. I have reviewed the risks, indications, and alternatives to cardiac catheterization, possible angioplasty, and stenting with the patient. Risks include but are not limited to bleeding, infection, vascular injury, stroke, myocardial infection, arrhythmia, kidney injury, radiation-related injury in the case of  prolonged fluoroscopy use, emergency cardiac surgery, and death. The patient understands the risks of serious complication is 1-2 in 1000 with diagnostic cardiac cath and 1-2% or less with angioplasty/stenting.  He will then be scheduled to undergo a gated CTA of the heart and CTA studies of the chest, abdomen, and pelvis to evaluate TAVR anatomy.  Finally, his case will be reviewed in our multidisciplinary heart valve meeting and he will be referred for formal cardiac surgical consultation as part of a multidisciplinary approach to his care.

## 2024-07-23 NOTE — Progress Notes (Signed)
 Cardiology Office Note:    Date:  07/23/2024   ID:  Corey Tarri Sierra Sr., DOB 1956/03/25, MRN [id number]  PCP:  [name], MD    [facility]Care Providers Cardiologist:  Annabella Scarce, MD     Referring MD: [name], MD   Chief Complaint  Patient presents with   Shortness of Breath    History of Present Illness:    Corey Tarri Sierra Sr. is a 68 y.o. male presenting for TAVR consultation, referred by Reche Finder, NP.   The patient is here with his ex-wife today.  He was recently hospitalized with acute on chronic heart failure and found to have moderately severe LV dysfunction with LVEF 35% and severe aortic valve disease with severe low-flow low gradient aortic stenosis and moderate aortic insufficiency as well as moderately severe mitral regurgitation.  He previously had a normal LVEF of 60 to 65% with mild mitral insufficiency in January of this year.  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  He has been followed by palliative care but has remained functionally independent.  He lives with his daughter.  He continues to work at [employer] facility and he has been on light duty since his heart failure hospitalization.  He desires to seek any treatment that would help his quality of life.  He has followed up with oncology and he is felt to have a prognosis greater than 12 months and it has been recommended that he seek treatment for his valvular heart disease.  The patient complains of shortness of breath with activity.  He has had edema and orthopnea as well as PND.  Those symptoms have all improved but he continues to have fatigue and exertional dyspnea with an NYHA functional class II limitation at present.  He has no chest pain or pressure.  No lightheadedness or syncope.   The patient is edentulous and wears full dentures upper and lower.  Current Medications: Current Meds  Medication Sig   acetaminophen  (TYLENOL ) 500 MG tablet Take 1,000 mg by mouth every 6 (six) hours as needed.   aspirin  EC 81 MG tablet Take 1 tablet (81 mg total) by mouth daily. Swallow whole.   atorvastatin  (LIPITOR) 40 MG tablet Take 1 tablet (40 mg total) by mouth daily.   furosemide  (LASIX ) 40 MG tablet Take 1 tablet (40 mg total) by mouth daily.   hydrALAZINE  (APRESOLINE ) 10 MG tablet TAKE 1 TABLET 2 (TWO) TIMES DAILY AS NEEDED. TAKE FOR SYSTOLIC BLOOD PRESSURE MORE THAN 170. (Patient taking differently: Take 10 mg by mouth 2 (two) times daily as needed (SYSTOLIC BLOOD PRESSURE MORE THAN 170).)   olmesartan  (BENICAR ) 20 MG tablet Take 1 tablet (20 mg total) by mouth daily.   potassium chloride  SA (KLOR-CON  M) 20 MEQ tablet Take 1 tablet (20 mEq total) by mouth daily.     Allergies:   Patient has no known allergies.   Past Medical History:  Diagnosis Date   Headache    migraines   Hepatitis    HX OF hEP c ?    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   Past Surgical History:  Procedure Laterality Date   BREAST BIOPSY Left 03/26/2024   US  LT BREAST BX W LOC DEV 1ST LESION IMG BX SPEC US  GUIDE 03/26/2024 [facility]  MAMMOGRAPHY   DECOMPRESSIVE LUMBAR LAMINECTOMY LEVEL 1 N/A 11/20/2023   Procedure: lumbar laminectomy right Lumbar five with decompression of nerve roots and excision of tumor;  Surgeon: Colon Shove, MD;  Location: [facility] OR;  Service: Neurosurgery;  Laterality: N/A;   IR RADIOLOGIST EVAL & MGMT  01/25/2020   IR RADIOLOGIST EVAL & MGMT  03/21/2020   IR RADIOLOGIST EVAL & MGMT  06/07/2020   IR RADIOLOGIST EVAL & MGMT  09/05/2020   IR RADIOLOGIST EVAL & MGMT  12/05/2020   IR RADIOLOGIST EVAL & MGMT  05/09/2021   IR RADIOLOGIST EVAL & MGMT  09/06/2021   IR RADIOLOGIST EVAL & MGMT  12/12/2021   IR RADIOLOGIST EVAL & MGMT  06/25/2022   IR RADIOLOGIST  EVAL & MGMT  07/24/2022   RADIOLOGY WITH ANESTHESIA N/A 03/01/2020   Procedure: CT WITH ANESTHESIA  THERMAL MICROWAVE  ABLATIION;  Surgeon: Karalee Beat, MD;  Location: [facility] ORS;  Service: Anesthesiology;  Laterality: N/A;   RADIOLOGY WITH ANESTHESIA N/A 07/17/2022   Procedure: CT MICROWAVE ABLATION;  Surgeon: Karalee Beat POUR, MD;  Location: [facility] ORS;  Service: Radiology;  Laterality: N/A;    ROS:   Please see the history of present illness.    All other systems reviewed and are negative.  EKGs/Labs/Other Studies Reviewed:    The following studies were reviewed today: Cardiac Studies & Procedures   ______________________________________________________________________________________________     ECHOCARDIOGRAM  ECHOCARDIOGRAM COMPLETE 07/11/2024  Narrative ECHOCARDIOGRAM REPORT    Patient Name:   Corey Wilbon Sr. Date of Exam: 07/11/2024 Medical Rec #:  [id number]                  Height:       67.0 in Accession #:    [id number]                 Weight:       124.1 lb Date of Birth:  Nov 13, 1955                   BSA:          1.651 m Patient Age:    68 years                   BP:           118/63 mmHg Patient Gender: M                          HR:           71 bpm. Exam Location:  Inpatient  Procedure: 2D Echo, 3D Echo, Cardiac Doppler, Color Doppler and Strain Analysis (Both Spectral and Color Flow Doppler were utilized during procedure).  Indications:    CHF  History:        Patient has prior history of Echocardiogram examinations, most recent 11/19/2023. TIA, Aortic Valve Disease; Risk Factors:Hypertension.  Sonographer:    Therisa Crouch Referring Phys: [id number] MIR M [facility]  IMPRESSIONS   1. LVEDV, 3D, 188 mL. Apical sparring pattern on strain polar map; in conjunction with aortic valve disease, can be seen in ATTR-CA. Left ventricular ejection fraction, by estimation, is 30 to 35%. Left ventricular ejection fraction by 3D volume is 41 %. The left  ventricle has moderately decreased function. The left ventricle demonstrates global hypokinesis. The left ventricular internal cavity size was dilated. There is mild concentric left ventricular hypertrophy. Left ventricular diastolic parameters are indeterminate. The average left ventricular global  longitudinal strain is -10.7 %. The global longitudinal strain is abnormal. 2. Right ventricular systolic function is mildly reduced. The right ventricular size is normal. There is normal pulmonary artery systolic pressure. 3. Left atrial size was severely dilated. 4. Right atrial size was mildly dilated. 5. The mitral valve is grossly normal. Severe mitral valve regurgitation. No evidence of mitral stenosis. 6. There appear to be multiple jets vs eccentric flow; best seen in AC3 view. DVI 0.24. The aortic valve is calcified. Aortic valve regurgitation is moderate. Severe aortic valve stenosis. Aortic valve area, by VTI measures 0.76 cm. Aortic valve mean gradient measures 33.0 mmHg. Aortic valve Vmax measures 3.87 m/s. 7. The inferior vena cava is normal in size with <50% respiratory variability, suggesting right atrial pressure of 8 mmHg.  Comparison(s): Prior images reviewed side by side. Internal progression of disease with worsening ventricular function and mitral regurgitation.  FINDINGS Left Ventricle: LVEDV, 3D, 188 mL. Apical sparring pattern on strain polar map; in conjunction with aortic valve disease, can be seen in ATTR-CA. Left ventricular ejection fraction, by estimation, is 30 to 35%. Left ventricular ejection fraction by 3D volume is 41 %. The left ventricle has moderately decreased function. The left ventricle demonstrates global hypokinesis. The average left ventricular global longitudinal strain is -10.7 %. Strain was performed and the global longitudinal strain is abnormal. The left ventricular internal cavity size was dilated. There is mild concentric left ventricular hypertrophy.  Left ventricular diastolic parameters are indeterminate.  Right Ventricle: The right ventricular size is normal. No increase in right ventricular wall thickness. Right ventricular systolic function is mildly reduced. There is normal pulmonary artery systolic pressure. The tricuspid regurgitant velocity is 2.48 m/s, and with an assumed right atrial pressure of 8 mmHg, the estimated right ventricular systolic pressure is 32.6 mmHg.  Left Atrium: Left atrial size was severely dilated.  Right Atrium: Right atrial size was mildly dilated.  Pericardium: Trivial pericardial effusion is present. The pericardial effusion is posterior to the left ventricle.  Mitral Valve: The mitral valve is grossly normal. Severe mitral valve regurgitation. No evidence of mitral valve stenosis. MV peak gradient, 136.9 mmHg. The mean mitral valve gradient is 92.0 mmHg.  Tricuspid Valve: The tricuspid valve is normal in structure. Tricuspid valve regurgitation is not demonstrated. No evidence of tricuspid stenosis.  Aortic Valve: There appear to be multiple jets vs eccentric flow; best seen in AC3 view. DVI 0.24. The aortic valve is calcified. Aortic valve regurgitation is moderate. Aortic regurgitation PHT measures 311 msec. Severe aortic stenosis is present. Aortic valve mean gradient measures 33.0 mmHg. Aortic valve peak gradient measures 59.9 mmHg. Aortic valve area, by VTI measures 0.76 cm.  Pulmonic Valve: The pulmonic valve was normal in structure. Pulmonic valve regurgitation is trivial. No evidence of pulmonic stenosis.  Aorta: The aortic root and ascending aorta are structurally normal, with no evidence of dilitation.  Venous: The inferior vena cava is normal in size with less than 50% respiratory variability, suggesting right atrial pressure of 8 mmHg.  IAS/Shunts: No atrial level shunt detected by color flow Doppler.  Additional Comments: 3D was performed not requiring image post processing on an  independent workstation and was abnormal.   LEFT VENTRICLE PLAX 2D LVIDd:         5.70 cm         Diastology LVIDs:         4.40 cm         LV e' medial:    4.74  cm/s LV PW:         1.00 cm         LV E/e' medial:  25.3 LV IVS:        1.00 cm         LV e' lateral:   6.25 cm/s LVOT diam:     2.00 cm         LV E/e' lateral: 19.2 LV SV:         57 LV SV Index:   35              2D Longitudinal LVOT Area:     3.14 cm        Strain 2D Strain GLS   -10.7 % Avg: LV Volumes (MOD) LV vol d, MOD    180.0 ml      3D Volume EF A2C:                           LV 3D EF:    Left LV vol d, MOD    172.0 ml                   ventricul A4C:                                        ar LV vol s, MOD    113.0 ml                   ejection A2C:                                        fraction LV vol s, MOD    102.0 ml                   by 3D A4C:                                        volume is LV SV MOD A2C:   67.0 ml                    41 %. LV SV MOD A4C:   172.0 ml      LV 3D EDV:   188.00 ml LV SV MOD BP:    70.2 ml       LV 3D ESV:   111.36 ml  3D Volume EF: 3D EF:        41 %  RIGHT VENTRICLE            IVC RV Basal diam:  3.30 cm    IVC diam: 1.90 cm RV S prime:     8.18 cm/s TAPSE (M-mode): 1.5 cm  LEFT ATRIUM             Index        RIGHT ATRIUM           Index LA diam:        3.70 cm 2.24 cm/m   RA Area:     18.40 cm LA Vol (A2C):   71.1 ml 43.06 ml/m  RA Volume:   53.00 ml  32.10 ml/m LA Vol (A4C):  84.9 ml 51.42 ml/m LA Biplane Vol: 80.9 ml 49.00 ml/m AORTIC VALVE AV Area (Vmax):    0.76 cm AV Area (Vmean):   0.79 cm AV Area (VTI):     0.76 cm AV Vmax:           387.00 cm/s AV Vmean:          256.333 cm/s AV VTI:            0.753 m AV Peak Grad:      59.9 mmHg AV Mean Grad:      33.0 mmHg LVOT Vmax:         93.90 cm/s LVOT Vmean:        64.700 cm/s LVOT VTI:          0.182 m LVOT/AV VTI ratio: 0.24 AI PHT:            311 msec  AORTA Ao Root diam: 3.60 cm Ao  Asc diam:  3.30 cm  MITRAL VALVE                TRICUSPID VALVE MV Area (PHT): 4.40 cm     TV Peak grad:   24.6 mmHg MV Area VTI:   0.31 cm     TV Vmax:        2.48 m/s MV Peak grad:  136.9 mmHg   TR Peak grad:   24.6 mmHg MV Mean grad:  92.0 mmHg    TR Vmax:        248.00 cm/s MV Vmax:       5.85 m/s MV Vmean:      458.0 cm/s   SHUNTS MR PISA:        2.26 cm    Systemic VTI:  0.18 m MR PISA Radius: 0.60 cm     Systemic Diam: 2.00 cm MV E velocity: 120.00 cm/s MV A velocity: 48.40 cm/s MV E/A ratio:  2.48  Stanly Leavens MD Electronically signed by Stanly Leavens MD Signature Date/Time: 07/11/2024/12:52:36 PM    Final          ______________________________________________________________________________________________      EKG:        Recent Labs: 04/07/2024: TSH 1.820 07/11/2024: Pro Brain Natriuretic Peptide 6,052.0 07/13/2024: ALT 106; BUN 23; Creatinine 1.09; Hemoglobin 14.2; Platelet Count 140; Potassium 3.9; Sodium 143  Recent Lipid Panel    Component Value Date/Time   CHOL 177 12/09/2023 0633   TRIG 47 12/09/2023 0633   HDL 45 12/09/2023 0633   CHOLHDL 3.9 12/09/2023 0633   VLDL 9 12/09/2023 0633   LDLCALC 123 (H) 12/09/2023 0633     Risk Assessment/Calculations:                Physical Exam:    VS:  BP (!) 124/56 (BP Location: Left Arm, Patient Position: Sitting, Cuff Size: Normal)   Pulse 69   Ht 5' 7 (1.702 m)   Wt 126 lb 12.8 oz (57.5 kg)   SpO2 99%   BMI 19.86 kg/m     Wt Readings from Last 3 Encounters:  07/23/24 126 lb 12.8 oz (57.5 kg)  07/13/24 128 lb 12.8 oz (58.4 kg)  07/13/24 124 lb (56.2 kg)     GEN: Thin, pleasant male in no acute distress HEENT: Normal NECK: No JVD; bilateral carotid bruits LYMPHATICS: No lymphadenopathy CARDIAC: RRR, 3/6 crescendo decrescendo murmur at the right upper sternal border with diminished A2 RESPIRATORY:  Clear to auscultation without rales, wheezing or rhonchi  ABDOMEN: Soft,  non-tender,  non-distended MUSCULOSKELETAL:  No edema; No deformity  SKIN: Warm and dry NEUROLOGIC:  Alert and oriented x 3 PSYCHIATRIC:  Normal affect   Assessment & Plan Severe aortic stenosis The patient appears to have severe low-flow low gradient aortic stenosis (stage D2 with low ejection fraction).  He now has new LV dysfunction and progressive heart failure symptoms as well as worsening mitral regurgitation.  Initially it had been felt that he would be treated in a palliative fashion, but he again remains functionally independent, continues to work, and is expected to have a prognosis greater than 1 year per oncology assessment.  It is appropriate to proceed with treatment to alleviate symptoms and improve quality of life.  I personally reviewed his echo images which show severe LV dysfunction with LVEF 30 to 35% and mild RV dysfunction.  He has very severe appearing aortic stenosis with heavily calcified and mobile leaflets and moderate aortic insufficiency.  His calculated aortic valve area is 0.76 cm with a mean gradient of 33 mmHg, V-max 3.9 m/s, and dimensionless index of 0.24.  He also has 3+ mitral regurgitation.  I have reviewed the natural history of aortic stenosis with the patient and their family members who are present today. We have discussed the limitations of medical therapy and the poor prognosis associated with symptomatic aortic stenosis. We have reviewed potential treatment options, including palliative medical therapy, conventional surgical aortic valve replacement, and transcatheter aortic valve replacement. We discussed treatment options in the context of the patient's specific comorbid medical conditions.  The patient clearly expresses his desire to seek treatment.  He will be scheduled for right and left heart catheterization to assess hemodynamics and coronary anatomy. I have reviewed the risks, indications, and alternatives to cardiac catheterization, possible angioplasty,  and stenting with the patient. Risks include but are not limited to bleeding, infection, vascular injury, stroke, myocardial infection, arrhythmia, kidney injury, radiation-related injury in the case of prolonged fluoroscopy use, emergency cardiac surgery, and death. The patient understands the risks of serious complication is 1-2 in 1000 with diagnostic cardiac cath and 1-2% or less with angioplasty/stenting.  He will then be scheduled to undergo a gated CTA of the heart and CTA studies of the chest, abdomen, and pelvis to evaluate TAVR anatomy.  Finally, his case will be reviewed in our multidisciplinary heart valve meeting and he will be referred for formal cardiac surgical consultation as part of a multidisciplinary approach to his care. Pre-operative laboratory examination Preprocedure lab work will be ordered for cardiac cath.     Medication Adjustments/Labs and Tests Ordered: Current medicines are reviewed at length with the patient today.  Concerns regarding medicines are outlined above.  Orders Placed This Encounter  Procedures   CBC   Ambulatory referral to Cardiothoracic Surgery   No orders of the defined types were placed in this encounter.   Patient Instructions  Medication Instructions:  No medication changes were made at this visit. Continue current regimen.   *If you need a refill on your cardiac medications before your next appointment, please call your pharmacy*  Lab Work: To be completed today: CBC  If you have labs (blood work) drawn today and your tests are completely normal, you will receive your results only by: MyChart Message (if you have MyChart) OR A paper copy in the mail If you have any lab test that is abnormal or we need to change your treatment, we will call you to review the results.  Testing/Procedures: Your physician has requested that  you have a cardiac catheterization on 08/05/24 with Dr. Wonda at 7:30 AM (you will need to arrive by 5:30 AM). Cardiac  catheterization is used to diagnose and/or treat various heart conditions. Doctors may recommend this procedure for a number of different reasons. The most common reason is to evaluate chest pain. Chest pain can be a symptom of coronary artery disease (CAD), and cardiac catheterization can show whether plaque is narrowing or blocking your heart's arteries. This procedure is also used to evaluate the valves, as well as measure the blood flow and oxygen levels in different parts of your heart. For further information please visit [email or web address] Please follow instruction sheet, as given.  Our nurse navigator will be contacting you to schedule the needed CT scan(s) in the near future.  Follow-Up: At [facility], you and your health needs are our priority.  As part of our continuing mission to provide you with exceptional heart care, our providers are all part of one team.  This team includes your primary Cardiologist (physician) and Advanced Practice Providers or APPs (Physician Assistants and Nurse Practitioners) who all work together to provide you with the care you need, when you need it.  Your next appointment:   Per Structural Heart Team  Provider:   Ozell Wonda, MD or Izetta Hummer, PA-C    We recommend signing up for the patient portal called MyChart.  Sign up information is provided on this After Visit Summary.  MyChart is used to connect with patients for Virtual Visits (Telemedicine).  Patients are able to view lab/test results, encounter notes, upcoming appointments, etc.  Non-urgent messages can be sent to your provider as well.   To learn more about what you can do with MyChart, go to [email or web address].   Other Instructions       Cardiac/Peripheral Catheterization   You are scheduled for a Cardiac Catheterization on Thursday, September 25 with Dr. Ozell Wonda.  1. Please arrive at the [facility] (Main Entrance A) at [facility]: [address] [place], [place] [address] at 5:30 AM (This time is 2 hour(s) before your procedure to ensure your preparation). Your procedure is scheduled to begin at 7:30 AM.  Free valet parking service is available. You will check in at ADMITTING. The support person will be asked to wait in the waiting room.  It is OK to have someone drop you off and come back when you are ready to be discharged.        Special note: Every effort is made to have your procedure done on time. Please understand that emergencies sometimes delay scheduled procedures.  2. Diet: Nothing to eat after midnight.  3. Hydration:You need to be well hydrated before your procedure. On September 25, you may drink approved liquids (see below) until 2 hours before the procedure, with 16 oz of water as your last intake.   List of approved liquids water, clear juice, clear tea, black coffee, fruit juices, non-citric and without pulp, carbonated beverages, Gatorade, Kool -Aid, plain Jello-O and plain ice popsicles.  4. Labs: You will need to have blood drawn on Friday, September 12 at [facility] (1st Floor), [address] [place], [place] [address]. You do not need to be fasting.   5. Medication instructions in preparation for your procedure:   Contrast Allergy: No   Stop taking, Benicar  (Olmesartan ) Wednesday, September 24,, Lasix  (Furosemide )  Wednesday, September 24,   On the morning of  your procedure, take Aspirin  81 mg and any morning medicines NOT listed above.  You may use sips of water.  6. Plan to go home the same day, you will only stay overnight if medically necessary. 7. You MUST have a responsible adult to drive you home. 8. An adult MUST be with you the first 24 hours after you arrive home. 9. Bring a current list of your medications, and the last time and date medication taken. 10. Bring ID and current insurance cards. 11.Please wear clothes that are easy to get on and  off and wear slip-on shoes.  Thank you for allowing us  to care for you!   -- [facility] Invasive Cardiovascular services     Signed, [name], MD  07/23/2024 10:56 AM    [facility]

## 2024-07-23 NOTE — H&P (View-Only) (Signed)
 Cardiology Office Note:    Date:  07/23/2024   ID:  Corey Tarri Sierra Sr., DOB 1956/03/25, MRN [id number]  PCP:  [name], MD    HeartCare Providers Cardiologist:  Annabella Scarce, MD     Referring MD: [name], MD   Chief Complaint  Patient presents with   Shortness of Breath    History of Present Illness:    Corey Tarri Sierra Sr. is a 68 y.o. male presenting for TAVR consultation, referred by Reche Finder, NP.   The patient is here with his ex-wife today.  He was recently hospitalized with acute on chronic heart failure and found to have moderately severe LV dysfunction with LVEF 35% and severe aortic valve disease with severe low-flow low gradient aortic stenosis and moderate aortic insufficiency as well as moderately severe mitral regurgitation.  He previously had a normal LVEF of 60 to 65% with mild mitral insufficiency in January of this year.  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  He has been followed by palliative care but has remained functionally independent.  He lives with his daughter.  He continues to work at [employer] facility and he has been on light duty since his heart failure hospitalization.  He desires to seek any treatment that would help his quality of life.  He has followed up with oncology and he is felt to have a prognosis greater than 12 months and it has been recommended that he seek treatment for his valvular heart disease.  The patient complains of shortness of breath with activity.  He has had edema and orthopnea as well as PND.  Those symptoms have all improved but he continues to have fatigue and exertional dyspnea with an NYHA functional class II limitation at present.  He has no chest pain or pressure.  No lightheadedness or syncope.   The patient is edentulous and wears full dentures upper and lower.  Current Medications: Current Meds  Medication Sig   acetaminophen  (TYLENOL ) 500 MG tablet Take 1,000 mg by mouth every 6 (six) hours as needed.   aspirin  EC 81 MG tablet Take 1 tablet (81 mg total) by mouth daily. Swallow whole.   atorvastatin  (LIPITOR) 40 MG tablet Take 1 tablet (40 mg total) by mouth daily.   furosemide  (LASIX ) 40 MG tablet Take 1 tablet (40 mg total) by mouth daily.   hydrALAZINE  (APRESOLINE ) 10 MG tablet TAKE 1 TABLET 2 (TWO) TIMES DAILY AS NEEDED. TAKE FOR SYSTOLIC BLOOD PRESSURE MORE THAN 170. (Patient taking differently: Take 10 mg by mouth 2 (two) times daily as needed (SYSTOLIC BLOOD PRESSURE MORE THAN 170).)   olmesartan  (BENICAR ) 20 MG tablet Take 1 tablet (20 mg total) by mouth daily.   potassium chloride  SA (KLOR-CON  M) 20 MEQ tablet Take 1 tablet (20 mEq total) by mouth daily.     Allergies:   Patient has no known allergies.   Past Medical History:  Diagnosis Date   Headache    migraines   Hepatitis    HX OF hEP c ?    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   Past Surgical History:  Procedure Laterality Date   BREAST BIOPSY Left 03/26/2024   US  LT BREAST BX W LOC DEV 1ST LESION IMG BX SPEC US  GUIDE 03/26/2024 [facility]  MAMMOGRAPHY   DECOMPRESSIVE LUMBAR LAMINECTOMY LEVEL 1 N/A 11/20/2023   Procedure: lumbar laminectomy right Lumbar five with decompression of nerve roots and excision of tumor;  Surgeon: Colon Shove, MD;  Location: [facility] OR;  Service: Neurosurgery;  Laterality: N/A;   IR RADIOLOGIST EVAL & MGMT  01/25/2020   IR RADIOLOGIST EVAL & MGMT  03/21/2020   IR RADIOLOGIST EVAL & MGMT  06/07/2020   IR RADIOLOGIST EVAL & MGMT  09/05/2020   IR RADIOLOGIST EVAL & MGMT  12/05/2020   IR RADIOLOGIST EVAL & MGMT  05/09/2021   IR RADIOLOGIST EVAL & MGMT  09/06/2021   IR RADIOLOGIST EVAL & MGMT  12/12/2021   IR RADIOLOGIST EVAL & MGMT  06/25/2022   IR RADIOLOGIST  EVAL & MGMT  07/24/2022   RADIOLOGY WITH ANESTHESIA N/A 03/01/2020   Procedure: CT WITH ANESTHESIA  THERMAL MICROWAVE  ABLATIION;  Surgeon: Karalee Beat, MD;  Location: [facility] ORS;  Service: Anesthesiology;  Laterality: N/A;   RADIOLOGY WITH ANESTHESIA N/A 07/17/2022   Procedure: CT MICROWAVE ABLATION;  Surgeon: Karalee Beat POUR, MD;  Location: [facility] ORS;  Service: Radiology;  Laterality: N/A;    ROS:   Please see the history of present illness.    All other systems reviewed and are negative.  EKGs/Labs/Other Studies Reviewed:    The following studies were reviewed today: Cardiac Studies & Procedures   ______________________________________________________________________________________________     ECHOCARDIOGRAM  ECHOCARDIOGRAM COMPLETE 07/11/2024  Narrative ECHOCARDIOGRAM REPORT    Patient Name:   Corey Wilbon Sr. Date of Exam: 07/11/2024 Medical Rec #:  [id number]                  Height:       67.0 in Accession #:    [id number]                 Weight:       124.1 lb Date of Birth:  Nov 13, 1955                   BSA:          1.651 m Patient Age:    68 years                   BP:           118/63 mmHg Patient Gender: M                          HR:           71 bpm. Exam Location:  Inpatient  Procedure: 2D Echo, 3D Echo, Cardiac Doppler, Color Doppler and Strain Analysis (Both Spectral and Color Flow Doppler were utilized during procedure).  Indications:    CHF  History:        Patient has prior history of Echocardiogram examinations, most recent 11/19/2023. TIA, Aortic Valve Disease; Risk Factors:Hypertension.  Sonographer:    Therisa Crouch Referring Phys: [id number] MIR M [facility]  IMPRESSIONS   1. LVEDV, 3D, 188 mL. Apical sparring pattern on strain polar map; in conjunction with aortic valve disease, can be seen in ATTR-CA. Left ventricular ejection fraction, by estimation, is 30 to 35%. Left ventricular ejection fraction by 3D volume is 41 %. The left  ventricle has moderately decreased function. The left ventricle demonstrates global hypokinesis. The left ventricular internal cavity size was dilated. There is mild concentric left ventricular hypertrophy. Left ventricular diastolic parameters are indeterminate. The average left ventricular global  longitudinal strain is -10.7 %. The global longitudinal strain is abnormal. 2. Right ventricular systolic function is mildly reduced. The right ventricular size is normal. There is normal pulmonary artery systolic pressure. 3. Left atrial size was severely dilated. 4. Right atrial size was mildly dilated. 5. The mitral valve is grossly normal. Severe mitral valve regurgitation. No evidence of mitral stenosis. 6. There appear to be multiple jets vs eccentric flow; best seen in AC3 view. DVI 0.24. The aortic valve is calcified. Aortic valve regurgitation is moderate. Severe aortic valve stenosis. Aortic valve area, by VTI measures 0.76 cm. Aortic valve mean gradient measures 33.0 mmHg. Aortic valve Vmax measures 3.87 m/s. 7. The inferior vena cava is normal in size with <50% respiratory variability, suggesting right atrial pressure of 8 mmHg.  Comparison(s): Prior images reviewed side by side. Internal progression of disease with worsening ventricular function and mitral regurgitation.  FINDINGS Left Ventricle: LVEDV, 3D, 188 mL. Apical sparring pattern on strain polar map; in conjunction with aortic valve disease, can be seen in ATTR-CA. Left ventricular ejection fraction, by estimation, is 30 to 35%. Left ventricular ejection fraction by 3D volume is 41 %. The left ventricle has moderately decreased function. The left ventricle demonstrates global hypokinesis. The average left ventricular global longitudinal strain is -10.7 %. Strain was performed and the global longitudinal strain is abnormal. The left ventricular internal cavity size was dilated. There is mild concentric left ventricular hypertrophy.  Left ventricular diastolic parameters are indeterminate.  Right Ventricle: The right ventricular size is normal. No increase in right ventricular wall thickness. Right ventricular systolic function is mildly reduced. There is normal pulmonary artery systolic pressure. The tricuspid regurgitant velocity is 2.48 m/s, and with an assumed right atrial pressure of 8 mmHg, the estimated right ventricular systolic pressure is 32.6 mmHg.  Left Atrium: Left atrial size was severely dilated.  Right Atrium: Right atrial size was mildly dilated.  Pericardium: Trivial pericardial effusion is present. The pericardial effusion is posterior to the left ventricle.  Mitral Valve: The mitral valve is grossly normal. Severe mitral valve regurgitation. No evidence of mitral valve stenosis. MV peak gradient, 136.9 mmHg. The mean mitral valve gradient is 92.0 mmHg.  Tricuspid Valve: The tricuspid valve is normal in structure. Tricuspid valve regurgitation is not demonstrated. No evidence of tricuspid stenosis.  Aortic Valve: There appear to be multiple jets vs eccentric flow; best seen in AC3 view. DVI 0.24. The aortic valve is calcified. Aortic valve regurgitation is moderate. Aortic regurgitation PHT measures 311 msec. Severe aortic stenosis is present. Aortic valve mean gradient measures 33.0 mmHg. Aortic valve peak gradient measures 59.9 mmHg. Aortic valve area, by VTI measures 0.76 cm.  Pulmonic Valve: The pulmonic valve was normal in structure. Pulmonic valve regurgitation is trivial. No evidence of pulmonic stenosis.  Aorta: The aortic root and ascending aorta are structurally normal, with no evidence of dilitation.  Venous: The inferior vena cava is normal in size with less than 50% respiratory variability, suggesting right atrial pressure of 8 mmHg.  IAS/Shunts: No atrial level shunt detected by color flow Doppler.  Additional Comments: 3D was performed not requiring image post processing on an  independent workstation and was abnormal.   LEFT VENTRICLE PLAX 2D LVIDd:         5.70 cm         Diastology LVIDs:         4.40 cm         LV e' medial:    4.74  cm/s LV PW:         1.00 cm         LV E/e' medial:  25.3 LV IVS:        1.00 cm         LV e' lateral:   6.25 cm/s LVOT diam:     2.00 cm         LV E/e' lateral: 19.2 LV SV:         57 LV SV Index:   35              2D Longitudinal LVOT Area:     3.14 cm        Strain 2D Strain GLS   -10.7 % Avg: LV Volumes (MOD) LV vol d, MOD    180.0 ml      3D Volume EF A2C:                           LV 3D EF:    Left LV vol d, MOD    172.0 ml                   ventricul A4C:                                        ar LV vol s, MOD    113.0 ml                   ejection A2C:                                        fraction LV vol s, MOD    102.0 ml                   by 3D A4C:                                        volume is LV SV MOD A2C:   67.0 ml                    41 %. LV SV MOD A4C:   172.0 ml      LV 3D EDV:   188.00 ml LV SV MOD BP:    70.2 ml       LV 3D ESV:   111.36 ml  3D Volume EF: 3D EF:        41 %  RIGHT VENTRICLE            IVC RV Basal diam:  3.30 cm    IVC diam: 1.90 cm RV S prime:     8.18 cm/s TAPSE (M-mode): 1.5 cm  LEFT ATRIUM             Index        RIGHT ATRIUM           Index LA diam:        3.70 cm 2.24 cm/m   RA Area:     18.40 cm LA Vol (A2C):   71.1 ml 43.06 ml/m  RA Volume:   53.00 ml  32.10 ml/m LA Vol (A4C):  84.9 ml 51.42 ml/m LA Biplane Vol: 80.9 ml 49.00 ml/m AORTIC VALVE AV Area (Vmax):    0.76 cm AV Area (Vmean):   0.79 cm AV Area (VTI):     0.76 cm AV Vmax:           387.00 cm/s AV Vmean:          256.333 cm/s AV VTI:            0.753 m AV Peak Grad:      59.9 mmHg AV Mean Grad:      33.0 mmHg LVOT Vmax:         93.90 cm/s LVOT Vmean:        64.700 cm/s LVOT VTI:          0.182 m LVOT/AV VTI ratio: 0.24 AI PHT:            311 msec  AORTA Ao Root diam: 3.60 cm Ao  Asc diam:  3.30 cm  MITRAL VALVE                TRICUSPID VALVE MV Area (PHT): 4.40 cm     TV Peak grad:   24.6 mmHg MV Area VTI:   0.31 cm     TV Vmax:        2.48 m/s MV Peak grad:  136.9 mmHg   TR Peak grad:   24.6 mmHg MV Mean grad:  92.0 mmHg    TR Vmax:        248.00 cm/s MV Vmax:       5.85 m/s MV Vmean:      458.0 cm/s   SHUNTS MR PISA:        2.26 cm    Systemic VTI:  0.18 m MR PISA Radius: 0.60 cm     Systemic Diam: 2.00 cm MV E velocity: 120.00 cm/s MV A velocity: 48.40 cm/s MV E/A ratio:  2.48  Stanly Leavens MD Electronically signed by Stanly Leavens MD Signature Date/Time: 07/11/2024/12:52:36 PM    Final          ______________________________________________________________________________________________      EKG:        Recent Labs: 04/07/2024: TSH 1.820 07/11/2024: Pro Brain Natriuretic Peptide 6,052.0 07/13/2024: ALT 106; BUN 23; Creatinine 1.09; Hemoglobin 14.2; Platelet Count 140; Potassium 3.9; Sodium 143  Recent Lipid Panel    Component Value Date/Time   CHOL 177 12/09/2023 0633   TRIG 47 12/09/2023 0633   HDL 45 12/09/2023 0633   CHOLHDL 3.9 12/09/2023 0633   VLDL 9 12/09/2023 0633   LDLCALC 123 (H) 12/09/2023 0633     Risk Assessment/Calculations:                Physical Exam:    VS:  BP (!) 124/56 (BP Location: Left Arm, Patient Position: Sitting, Cuff Size: Normal)   Pulse 69   Ht 5' 7 (1.702 m)   Wt 126 lb 12.8 oz (57.5 kg)   SpO2 99%   BMI 19.86 kg/m     Wt Readings from Last 3 Encounters:  07/23/24 126 lb 12.8 oz (57.5 kg)  07/13/24 128 lb 12.8 oz (58.4 kg)  07/13/24 124 lb (56.2 kg)     GEN: Thin, pleasant male in no acute distress HEENT: Normal NECK: No JVD; bilateral carotid bruits LYMPHATICS: No lymphadenopathy CARDIAC: RRR, 3/6 crescendo decrescendo murmur at the right upper sternal border with diminished A2 RESPIRATORY:  Clear to auscultation without rales, wheezing or rhonchi  ABDOMEN: Soft,  non-tender,  non-distended MUSCULOSKELETAL:  No edema; No deformity  SKIN: Warm and dry NEUROLOGIC:  Alert and oriented x 3 PSYCHIATRIC:  Normal affect   Assessment & Plan Severe aortic stenosis The patient appears to have severe low-flow low gradient aortic stenosis (stage D2 with low ejection fraction).  He now has new LV dysfunction and progressive heart failure symptoms as well as worsening mitral regurgitation.  Initially it had been felt that he would be treated in a palliative fashion, but he again remains functionally independent, continues to work, and is expected to have a prognosis greater than 1 year per oncology assessment.  It is appropriate to proceed with treatment to alleviate symptoms and improve quality of life.  I personally reviewed his echo images which show severe LV dysfunction with LVEF 30 to 35% and mild RV dysfunction.  He has very severe appearing aortic stenosis with heavily calcified and mobile leaflets and moderate aortic insufficiency.  His calculated aortic valve area is 0.76 cm with a mean gradient of 33 mmHg, V-max 3.9 m/s, and dimensionless index of 0.24.  He also has 3+ mitral regurgitation.  I have reviewed the natural history of aortic stenosis with the patient and their family members who are present today. We have discussed the limitations of medical therapy and the poor prognosis associated with symptomatic aortic stenosis. We have reviewed potential treatment options, including palliative medical therapy, conventional surgical aortic valve replacement, and transcatheter aortic valve replacement. We discussed treatment options in the context of the patient's specific comorbid medical conditions.  The patient clearly expresses his desire to seek treatment.  He will be scheduled for right and left heart catheterization to assess hemodynamics and coronary anatomy. I have reviewed the risks, indications, and alternatives to cardiac catheterization, possible angioplasty,  and stenting with the patient. Risks include but are not limited to bleeding, infection, vascular injury, stroke, myocardial infection, arrhythmia, kidney injury, radiation-related injury in the case of prolonged fluoroscopy use, emergency cardiac surgery, and death. The patient understands the risks of serious complication is 1-2 in 1000 with diagnostic cardiac cath and 1-2% or less with angioplasty/stenting.  He will then be scheduled to undergo a gated CTA of the heart and CTA studies of the chest, abdomen, and pelvis to evaluate TAVR anatomy.  Finally, his case will be reviewed in our multidisciplinary heart valve meeting and he will be referred for formal cardiac surgical consultation as part of a multidisciplinary approach to his care. Pre-operative laboratory examination Preprocedure lab work will be ordered for cardiac cath.     Medication Adjustments/Labs and Tests Ordered: Current medicines are reviewed at length with the patient today.  Concerns regarding medicines are outlined above.  Orders Placed This Encounter  Procedures   CBC   Ambulatory referral to Cardiothoracic Surgery   No orders of the defined types were placed in this encounter.   Patient Instructions  Medication Instructions:  No medication changes were made at this visit. Continue current regimen.   *If you need a refill on your cardiac medications before your next appointment, please call your pharmacy*  Lab Work: To be completed today: CBC  If you have labs (blood work) drawn today and your tests are completely normal, you will receive your results only by: MyChart Message (if you have MyChart) OR A paper copy in the mail If you have any lab test that is abnormal or we need to change your treatment, we will call you to review the results.  Testing/Procedures: Your physician has requested that  you have a cardiac catheterization on 08/05/24 with Dr. Wonda at 7:30 AM (you will need to arrive by 5:30 AM). Cardiac  catheterization is used to diagnose and/or treat various heart conditions. Doctors may recommend this procedure for a number of different reasons. The most common reason is to evaluate chest pain. Chest pain can be a symptom of coronary artery disease (CAD), and cardiac catheterization can show whether plaque is narrowing or blocking your heart's arteries. This procedure is also used to evaluate the valves, as well as measure the blood flow and oxygen levels in different parts of your heart. For further information please visit [email or web address] Please follow instruction sheet, as given.  Our nurse navigator will be contacting you to schedule the needed CT scan(s) in the near future.  Follow-Up: At [facility], you and your health needs are our priority.  As part of our continuing mission to provide you with exceptional heart care, our providers are all part of one team.  This team includes your primary Cardiologist (physician) and Advanced Practice Providers or APPs (Physician Assistants and Nurse Practitioners) who all work together to provide you with the care you need, when you need it.  Your next appointment:   Per Structural Heart Team  Provider:   Ozell Wonda, MD or Izetta Hummer, PA-C    We recommend signing up for the patient portal called MyChart.  Sign up information is provided on this After Visit Summary.  MyChart is used to connect with patients for Virtual Visits (Telemedicine).  Patients are able to view lab/test results, encounter notes, upcoming appointments, etc.  Non-urgent messages can be sent to your provider as well.   To learn more about what you can do with MyChart, go to [email or web address].   Other Instructions       Cardiac/Peripheral Catheterization   You are scheduled for a Cardiac Catheterization on Thursday, September 25 with Dr. Ozell Wonda.  1. Please arrive at the [facility] (Main Entrance A) at [facility]: [address] [place], [place] [address] at 5:30 AM (This time is 2 hour(s) before your procedure to ensure your preparation). Your procedure is scheduled to begin at 7:30 AM.  Free valet parking service is available. You will check in at ADMITTING. The support person will be asked to wait in the waiting room.  It is OK to have someone drop you off and come back when you are ready to be discharged.        Special note: Every effort is made to have your procedure done on time. Please understand that emergencies sometimes delay scheduled procedures.  2. Diet: Nothing to eat after midnight.  3. Hydration:You need to be well hydrated before your procedure. On September 25, you may drink approved liquids (see below) until 2 hours before the procedure, with 16 oz of water as your last intake.   List of approved liquids water, clear juice, clear tea, black coffee, fruit juices, non-citric and without pulp, carbonated beverages, Gatorade, Kool -Aid, plain Jello-O and plain ice popsicles.  4. Labs: You will need to have blood drawn on Friday, September 12 at [facility] (1st Floor), [address] [place], [place] [address]. You do not need to be fasting.   5. Medication instructions in preparation for your procedure:   Contrast Allergy: No   Stop taking, Benicar  (Olmesartan ) Wednesday, September 24,, Lasix  (Furosemide )  Wednesday, September 24,   On the morning of  your procedure, take Aspirin  81 mg and any morning medicines NOT listed above.  You may use sips of water.  6. Plan to go home the same day, you will only stay overnight if medically necessary. 7. You MUST have a responsible adult to drive you home. 8. An adult MUST be with you the first 24 hours after you arrive home. 9. Bring a current list of your medications, and the last time and date medication taken. 10. Bring ID and current insurance cards. 11.Please wear clothes that are easy to get on and  off and wear slip-on shoes.  Thank you for allowing us  to care for you!   -- [facility] Invasive Cardiovascular services     Signed, [name], MD  07/23/2024 10:56 AM    [facility]

## 2024-07-23 NOTE — Patient Instructions (Signed)
 Medication Instructions:  No medication changes were made at this visit. Continue current regimen.   *If you need a refill on your cardiac medications before your next appointment, please call your pharmacy*  Lab Work: To be completed today: CBC  If you have labs (blood work) drawn today and your tests are completely normal, you will receive your results only by: MyChart Message (if you have MyChart) OR A paper copy in the mail If you have any lab test that is abnormal or we need to change your treatment, we will call you to review the results.  Testing/Procedures: Your physician has requested that you have a cardiac catheterization on 08/05/24 with Dr. Wonda at 7:30 AM (you will need to arrive by 5:30 AM). Cardiac catheterization is used to diagnose and/or treat various heart conditions. Doctors may recommend this procedure for a number of different reasons. The most common reason is to evaluate chest pain. Chest pain can be a symptom of coronary artery disease (CAD), and cardiac catheterization can show whether plaque is narrowing or blocking your heart's arteries. This procedure is also used to evaluate the valves, as well as measure the blood flow and oxygen levels in different parts of your heart. For further information please visit [email or web address] Please follow instruction sheet, as given.  Our nurse navigator will be contacting you to schedule the needed CT scan(s) in the near future.  Follow-Up: At [facility], you and your health needs are our priority.  As part of our continuing mission to provide you with exceptional heart care, our providers are all part of one team.  This team includes your primary Cardiologist (physician) and Advanced Practice Providers or APPs (Physician Assistants and Nurse Practitioners) who all work together to provide you with the care you need, when you need it.  Your next appointment:   Per Structural Heart Team  Provider:   Ozell Wonda, MD or Izetta Hummer, PA-C    We recommend signing up for the patient portal called MyChart.  Sign up information is provided on this After Visit Summary.  MyChart is used to connect with patients for Virtual Visits (Telemedicine).  Patients are able to view lab/test results, encounter notes, upcoming appointments, etc.  Non-urgent messages can be sent to your provider as well.   To learn more about what you can do with MyChart, go to [email or web address].   Other Instructions       Cardiac/Peripheral Catheterization   You are scheduled for a Cardiac Catheterization on Thursday, September 25 with Dr. Ozell Wonda.  1. Please arrive at the [facility] (Main Entrance A) at [facility]: [address] [place], [place] [address] at 5:30 AM (This time is 2 hour(s) before your procedure to ensure your preparation). Your procedure is scheduled to begin at 7:30 AM.  Free valet parking service is available. You will check in at ADMITTING. The support person will be asked to wait in the waiting room.  It is OK to have someone drop you off and come back when you are ready to be discharged.        Special note: Every effort is made to have your procedure done on time. Please understand that emergencies sometimes delay scheduled procedures.  2. Diet: Nothing to eat after midnight.  3. Hydration:You need to be well hydrated before your procedure. On September 25, you may drink approved liquids (see below) until 2 hours before the procedure, with 16 oz of water as your last intake.  List of approved liquids water, clear juice, clear tea, black coffee, fruit juices, non-citric and without pulp, carbonated beverages, Gatorade, Kool -Aid, plain Jello-O and plain ice popsicles.  4. Labs: You will need to have blood drawn on Friday, September 12 at [facility] (1st Floor), [address] [place], [place] [address]. You do not need to be  fasting.   5. Medication instructions in preparation for your procedure:   Contrast Allergy: No   Stop taking, Benicar  (Olmesartan ) Wednesday, September 24,, Lasix  (Furosemide )  Wednesday, September 24,   On the morning of your procedure, take Aspirin  81 mg and any morning medicines NOT listed above.  You may use sips of water.  6. Plan to go home the same day, you will only stay overnight if medically necessary. 7. You MUST have a responsible adult to drive you home. 8. An adult MUST be with you the first 24 hours after you arrive home. 9. Bring a current list of your medications, and the last time and date medication taken. 10. Bring ID and current insurance cards. 11.Please wear clothes that are easy to get on and off and wear slip-on shoes.  Thank you for allowing us  to care for you!   -- [facility]

## 2024-07-27 ENCOUNTER — Other Ambulatory Visit: Payer: Self-pay

## 2024-07-27 ENCOUNTER — Ambulatory Visit (HOSPITAL_COMMUNITY)
Admission: RE | Admit: 2024-07-27 | Discharge: 2024-07-27 | Disposition: A | Discharge status: Home / Self Care | Source: Ambulatory Visit | Attending: Hematology | Admitting: Hematology

## 2024-07-27 DIAGNOSIS — I350 Nonrheumatic aortic (valve) stenosis: Secondary | ICD-10-CM

## 2024-07-27 DIAGNOSIS — C220 Liver cell carcinoma: Secondary | ICD-10-CM | POA: Diagnosis present

## 2024-07-27 LAB — CBC
Hematocrit: 34.4 % — ABNORMAL LOW (ref 37.5–51.0)
Hemoglobin: 11.2 g/dL — ABNORMAL LOW (ref 13.0–17.7)
MCH: 33.1 pg — ABNORMAL HIGH (ref 26.6–33.0)
MCHC: 32.6 g/dL (ref 31.5–35.7)
MCV: 102 fL — ABNORMAL HIGH (ref 79–97)
Platelets: 163 x10E3/uL (ref 150–450)
RBC: 3.38 x10E6/uL — ABNORMAL LOW (ref 4.14–5.80)
RDW: 13.8 % (ref 11.6–15.4)
WBC: 3.4 x10E3/uL (ref 3.4–10.8)

## 2024-07-27 MED ORDER — IOHEXOL 300 MG/ML  SOLN
80.0000 mL | Freq: Once | INTRAMUSCULAR | Status: AC | PRN
  Administered 2024-07-27: 80 mL via INTRAVENOUS

## 2024-08-02 NOTE — Assessment & Plan Note (Signed)
 diagnosed 11/2019 by MRI for f/u of cirrhosis. S/p microwave ablation on 03/01/20. -MRI 06/01/22 showed suspicion for local recurrence and metastasis at L5. Biopsy of L5 confirmed metastatic HCC. S/p repeat microwave ablation on 07/17/22. -started lenvima  08/16/22 -staging CT CAP and bone scan on 09/19/22 showed: interval enlargement of right hepatic lobe HCC; new retropulsed bony fragment at L5; no other new lesions. His previous CT was in 05/2022 and he did not start Lenvima  until 07/2022, so it's hard to evaluate if he is responding to Lenvima  or not, the progression is likely the nature course of his [facility]  -Restaging CT scan from January 02, 2023 showed slightly improved previously treated liver lesion, and additional area of concern in the liver which are difficult to compare with previous scan due to different timing of contrast.  No other evidence of disease progression.  Plan to obtain MRI as next scan.  I personally reviewed the CT scan images and discussed the findings with patient -Will continue Lenvima , he is tolerating well overall. -Due to his weight being above 60 kg now, I increased his Lenvima  to 12 mg daily in Feb 2024 -repeated liver MRI on 04/23/2023 showed stable treated liver lesion, L5 bone mets has slightly increased in size.  Patient developed worsening low back pain in July 2024, repeated lumbar MRI showed significant disease progression in the L5 bone lesion.  I personally reviewed the scan images and discussed the findings with patient. -I recommend palliative radiation to his L5 lesion for pain control -I recommend changing systemic treatment to immunotherapy, I discussed option of atezolizumab and bevacizumab, durvalumab  and Imjudo , nivo and ipi etc. since he previously progressed on oral TKI, I recommend him to proceed with durvalumab  and Imjudo . He started on 06/30/23 -the goal of therapy is curative/palliative to prolong life and prevent/improve cancer related symptoms. -he underwent  Bilateral laminotomies L5-S1 with decompression of the spinal canal and the L5 nerve roots epidural tumor by Dr. Colon On 11/20/2023 -he received SBRT to low back in Feb 2025 -CT and abdominal MRI showed new enhancing lesion posteriorly in segment 7, suspicious for recurrence/metastasis, otherwise stable treated liver lesions and bone mets.  I discussed option of ablation versus SBRT radiation.  Patient prefers SBRT. -due to worsening LFTs, I stopped Durvalumab  in May 2025  -I referred him back to Dr. Patrcia and plan to change systemic therapy to carbozentinib when his LFT improves  -He started SBRT to new oligo liver met on 05/05/2024 and finished on 05/17/24 -He started ramping dose of cabozantinib on May 24, 2024. He is tolerating full dose well.

## 2024-08-03 ENCOUNTER — Inpatient Hospital Stay

## 2024-08-03 ENCOUNTER — Inpatient Hospital Stay (HOSPITAL_BASED_OUTPATIENT_CLINIC_OR_DEPARTMENT_OTHER): Admitting: Hematology

## 2024-08-03 VITALS — BP 118/58 | HR 81 | Temp 97.9°F | Resp 16 | Ht 67.0 in | Wt 126.8 lb

## 2024-08-03 DIAGNOSIS — C220 Liver cell carcinoma: Secondary | ICD-10-CM

## 2024-08-03 LAB — CBC WITH DIFFERENTIAL (CANCER CENTER ONLY)
Abs Immature Granulocytes: 0 K/uL (ref 0.00–0.07)
Basophils Absolute: 0 K/uL (ref 0.0–0.1)
Basophils Relative: 1 %
Eosinophils Absolute: 0.1 K/uL (ref 0.0–0.5)
Eosinophils Relative: 4 %
HCT: 38.2 % — ABNORMAL LOW (ref 39.0–52.0)
Hemoglobin: 12.3 g/dL — ABNORMAL LOW (ref 13.0–17.0)
Immature Granulocytes: 0 %
Lymphocytes Relative: 24 %
Lymphs Abs: 1 K/uL (ref 0.7–4.0)
MCH: 32.6 pg (ref 26.0–34.0)
MCHC: 32.2 g/dL (ref 30.0–36.0)
MCV: 101.3 fL — ABNORMAL HIGH (ref 80.0–100.0)
Monocytes Absolute: 0.6 K/uL (ref 0.1–1.0)
Monocytes Relative: 16 %
Neutro Abs: 2.2 K/uL (ref 1.7–7.7)
Neutrophils Relative %: 55 %
Platelet Count: 187 K/uL (ref 150–400)
RBC: 3.77 MIL/uL — ABNORMAL LOW (ref 4.22–5.81)
RDW: 15.9 % — ABNORMAL HIGH (ref 11.5–15.5)
WBC Count: 4 K/uL (ref 4.0–10.5)
nRBC: 0 % (ref 0.0–0.2)

## 2024-08-03 LAB — CMP (CANCER CENTER ONLY)
ALT: 92 U/L — ABNORMAL HIGH (ref 0–44)
AST: 78 U/L — ABNORMAL HIGH (ref 15–41)
Albumin: 4 g/dL (ref 3.5–5.0)
Alkaline Phosphatase: 489 U/L — ABNORMAL HIGH (ref 38–126)
Anion gap: 5 (ref 5–15)
BUN: 22 mg/dL (ref 8–23)
CO2: 29 mmol/L (ref 22–32)
Calcium: 10 mg/dL (ref 8.9–10.3)
Chloride: 109 mmol/L (ref 98–111)
Creatinine: 1.04 mg/dL (ref 0.61–1.24)
GFR, Estimated: >60 mL/min (ref >60)
Glucose, Bld: 103 mg/dL — ABNORMAL HIGH (ref 70–99)
Potassium: 4.2 mmol/L (ref 3.5–5.1)
Sodium: 143 mmol/L (ref 135–145)
Total Bilirubin: 0.5 mg/dL (ref 0.0–1.2)
Total Protein: 7.7 g/dL (ref 6.5–8.1)

## 2024-08-03 NOTE — Progress Notes (Signed)
 [facility]   [phone] Fax:([phone]) [phone]   Clinic Follow up Note   Patient Care Team: [name], MD as PCP - General (Internal Medicine) [name], MD as PCP - Cardiology (Cardiology) [name], MD as Attending Physician (Hematology and Oncology) [name], [name], NP as Nurse Practitioner (Hospice and Palliative Medicine) Imaging, [facility] as Radiologist (Diagnostic Radiology)  Date of Service:  08/03/2024  CHIEF COMPLAINT: f/u of HCC  CURRENT THERAPY:  Cabozantinib 60 mg daily  Oncology History   Hepatocellular carcinoma in adult [facility]) diagnosed 11/2019 by MRI for f/u of cirrhosis. S/p microwave ablation on 03/01/20. -MRI 06/01/22 showed suspicion for local recurrence and metastasis at L5. Biopsy of L5 confirmed metastatic HCC. S/p repeat microwave ablation on 07/17/22. -started lenvima  08/16/22 -staging CT CAP and bone scan on 09/19/22 showed: interval enlargement of right hepatic lobe HCC; new retropulsed bony fragment at L5; no other new lesions. His previous CT was in 05/2022 and he did not start Lenvima  until 07/2022, so it's hard to evaluate if he is responding to Lenvima  or not, the progression is likely the nature course of his [facility]  -Restaging CT scan from January 02, 2023 showed slightly improved previously treated liver lesion, and additional area of concern in the liver which are difficult to compare with previous scan due to different timing of contrast.  No other evidence of disease progression.  Plan to obtain MRI as next scan.  I personally reviewed the CT scan images and discussed the findings with patient -Will continue Lenvima , he is tolerating well overall. -Due to his weight being above 60 kg now, I increased his Lenvima  to 12 mg daily in Feb 2024 -repeated liver MRI on 04/23/2023 showed stable treated liver lesion, L5 bone mets has slightly increased in size.  Patient developed worsening low  back pain in July 2024, repeated lumbar MRI showed significant disease progression in the L5 bone lesion.  I personally reviewed the scan images and discussed the findings with patient. -I recommend palliative radiation to his L5 lesion for pain control -I recommend changing systemic treatment to immunotherapy, I discussed option of atezolizumab and bevacizumab, durvalumab  and Imjudo , nivo and ipi etc. since he previously progressed on oral TKI, I recommend him to proceed with durvalumab  and Imjudo . He started on 06/30/23 -the goal of therapy is curative/palliative to prolong life and prevent/improve cancer related symptoms. -he underwent Bilateral laminotomies L5-S1 with decompression of the spinal canal and the L5 nerve roots epidural tumor by Dr. Colon On 11/20/2023 -he received SBRT to low back in Feb 2025 -CT and abdominal MRI showed new enhancing lesion posteriorly in segment 7, suspicious for recurrence/metastasis, otherwise stable treated liver lesions and bone mets.  I discussed option of ablation versus SBRT radiation.  Patient prefers SBRT. -due to worsening LFTs, I stopped Durvalumab  in May 2025  -I referred him back to Dr. Patrcia and plan to change systemic therapy to carbozentinib when his LFT improves  -He started SBRT to new oligo liver met on 05/05/2024 and finished on 05/17/24 -He started ramping dose of cabozantinib on May 24, 2024. He is tolerating full dose well.   Assessment & Plan Hepatocellular carcinoma Hepatocellular carcinoma managed with cabozantinib. Liver function tests show normal bilirubin and slightly elevated liver enzymes, improved from the last visit. No severe side effects from cabozantinib, except hair color change to white. Cabozantinib held due to upcoming cardiac procedures and surgery planning, with plans to resume post-procedure. Coordination  with cardiologist needed to determine if cabozantinib should be held before surgery, depending on invasiveness. - Order  cabozantinib from [facility]. - Resume cabozantinib after cardiac catheterization on August 05, 2024. - Do not hold cabozantinib for CT scan on August 09, 2024. - Discuss with cardiologist regarding holding cabozantinib before surgery, depending on invasiveness. - Schedule follow-up appointment in one month to review surgery date and adjust cabozantinib as needed.  Plan - Patient's wife will call [facility], to get his cabozantinib refilled and is shipped as soon as possible.  He will restart later this week on Friday - Lab and follow-up in 1 month. May hold it before his heart surgery     SUMMARY OF ONCOLOGIC HISTORY: Oncology History  Hepatocellular carcinoma in adult [facility])  12/08/2019 Imaging   MR ABDOMEN WWO CONTRAST   IMPRESSION: Two small masses in the right hepatic lobe which have characteristics diagnostic for hepatocellular carcinoma in the setting of cirrhosis. (LI-RADS Category 5: Definitely HCC)   No evidence of abdominal metastatic disease or other significant abnormality.   03/01/2020 Initial Diagnosis   Hepatocellular carcinoma (HCC)   03/01/2020 Procedure   CT GUIDE TISSUE ABLATION     IMPRESSION: Successful percutaneous thermal ablation.   Signed,   [name], MD, RPVI   06/02/2020 Imaging   MR ABDOMEN WWO CONTRAST   IMPRESSION: 1. There has been interval ablation at two adjacent sites of the subcapsular right lobe of the liver, hepatic segment VI. There is no residual contrast enhancement at these adjacent sites. LI-RADS category 5 TR, nonviable. 2. There are scattered, subcentimeter foci of arterial hyperenhancement of the anterior right lobe of the liver, for example adjacent to the ablation site measuring 3 mm and in the medial anterior right lobe of the liver measuring 6 mm. This larger focus is unchanged compared to prior. Others were not previously seen. These findings are nonspecific and consistent with  LI-RADS category 3. Attention on follow-up.   09/02/2020 Imaging   MR ABDOMEN WWO CONTRAST     IMPRESSION: 1. Post radiofrequency ablation along the margin of the RIGHT hemi liver. Heterogeneous appearance of this area on both T1 and T2. No sign of residual enhancement. LR TR nonviable. 2. Very subtle focus of restricted diffusion in the cephalad lateral segment LEFT hepatic lobe measuring 7 mm, corresponding to low signal on image 27 of series 21. Arterial enhancement is not seen in this area and this areas not seen on prior imaging. Not clear whether this represents a new lesion as this area is obscured largely by artifact on most sequences on today's study and on previous imaging evaluations. LR category 3, consider a 3 to six-month follow-up with abdominal MRI. 3. Lesions of concern scattered about the RIGHT hepatic lobe may represent perfusional anomalies as they were not seen on today's study. 4. Stable mild dilation of the biliary tree.   11/27/2020 Imaging   MR ABDOMEN WWO CONTRAST   IMPRESSION: Minimal decrease in size of ablation defect in the peripheral anterior right hepatic lobe. No evidence of locally recurrent hepatocellular carcinoma or other suspicious liver lesions.   No evidence of metastatic disease or other acute findings.   04/03/2021 Imaging   MR ABDOMEN WWO CONTRAST   IMPRESSION: Stable ablation defect in the lateral right hepatic lobe, without evidence of recurrent tumor at this site.   5 mm hypovascular lesion in segment 5 of the right lobe was not definitely seen on previous study, but is too small  to characterize. This shows no arterial phase hyperenhancement. Recommend continued follow-up by MRI in 6 months.   No evidence of abdominal metastatic disease.        09/03/2021 Imaging   MR ABDOMEN WWO CONTRAST   IMPRESSION: 1. Side-by-side ablation sites in the right hepatic lobe in segment 6. The more anterior site remains largely cystic  in appearance and no findings suspicious recurrent tumor in this area. There is however abnormal contrast enhancement around the larger more posterior and lateral ablation site worrisome for recurrent tumor. 2. Several small foci of early arterial phase enhancement in the liver. The peripheral lesions are likely vascular shunts. The more central lesions are likely dysplastic nodules. Recommend continued surveillance. 3. Stable bilateral renal cysts.   12/07/2021 Imaging   MR ABDOMEN WWO CONTRAST  IMPRESSION: 1. Ablation defects in the right lobe of the liver, similar to the prior study, without definitive evidence to suggest local recurrence of disease or metastatic disease. 2. Multiple tiny arterial phase areas of nodular hyperenhancement scattered throughout the liver, predominantly in the right lobe of the liver, without perceptible signal abnormality or diffusion restriction on other pulse sequences, nonspecific, and likely small benign areas of vascular shunting. Continued attention on follow-up imaging is recommended. 3. Multiple Bosniak class 1 and Bosniak class 2 cysts in the kidneys bilaterally, similar to the prior study, as above. 4. Aortic atherosclerosis.     06/01/2022 Imaging   MR ABDOMEN WWO CONTRAST   IMPRESSION: 1. LR TR equivocal but suspicious findings with respect to previously treated disease in the RIGHT hepatic lobe given nodular enhancement adjacent and anterior superior to the ablation zone as described. 2. Given lack of late arterial phase due to bolus timing a second area of abnormality within the liver is suspicious for additional site of hepatocellular carcinoma showing washout appearance and capsule appearance on current imaging, cardiac pulsation limiting assessment in the area of concern. 3. Suspect bony metastatic disease in the lower lumbar spine at L5 level. Dedicated spinal imaging may be helpful for further evaluation.     06/18/2022  Imaging   MR Lumbar Spine W Wo Contrast   IMPRESSION: L5 vertebral body lesion is most consistent with a metastasis. There is probable trace ventral epidural extension   07/17/2022 Procedure   CT GUIDE TISSUE ABLATION   IMPRESSION: Successful CT-guided microwave ablation of recurrent hepatocellular carcinoma.   07/17/2022 Procedure   CT BONE TROCAR/NEEDLE BIOPSY SUPERFICIAL   IMPRESSION: Successful CT-guided core biopsy of L5 bone lesion.      07/17/2022 Pathology Results   SURGICAL PATHOLOGY  CASE: [id number]  PATIENT: KHAMARION BJELLAND  Surgical Pathology Report   SURGICAL PATHOLOGY  CASE: [id number]  PATIENT: ELLAREE SIERRA  Surgical Pathology Report      Clinical History: Hx of cirrhosis and small HCC with unexpected  suspicious lesion at L5. Rare HCC met to bone vs atypical hemangioma or  met from new primary. (crm)      FINAL MICROSCOPIC DIAGNOSIS:   A. 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 No acute findings within the chest. No suspicious findings for metastatic disease within the chest. 2. Unchanged 3 mm nodule within the medial right upper lobe. No new or suspicious lung nodules. 3. Suboptimal visualization of the known liver lesions, reflecting lack of IV contrast material. Similar appearance of ablation defect  within the lateral right lobe of liver. 4. Left renal calculi. 5. Coronary artery calcifications. 6.  Aortic Atherosclerosis   06/25/2023 Imaging   NM Bone Scan whole body  IMPRESSION: 1. Known L5 metastasis demonstrates low level uptake, mildly increased from previous bone scan. 2. No new lesions demonstrated.   06/30/2023 - 03/10/2024 Chemotherapy   Patient is on Treatment Plan : Hepatocellular Carcinoma Tremelimumab -actl C1 D1 + Durvalumab  q28d        Imaging        Discussed the use of AI scribe software for clinical note transcription with the patient, who gave verbal consent to proceed.  History of Present Illness Corey Bargar Sr. is a 68 year old male with hepatocellular carcinoma who presents for follow-up.  He has not taken cabozantinib since September 2nd due to a prescription issue and in preparation for an upcoming cardiac procedure. He experiences no significant side effects from cabozantinib, except for a change in hair color to white. Liver function tests from the last visit show normal bilirubin and slightly elevated liver enzymes.     All other systems were reviewed with the patient and are negative.  MEDICAL HISTORY:  Past Medical History:  Diagnosis Date   Headache    migraines   Hepatitis    HX OF hEP c ?    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    SURGICAL HISTORY: Past Surgical History:  Procedure Laterality Date   BREAST BIOPSY Left 03/26/2024   US  LT BREAST BX W LOC DEV 1ST LESION IMG BX SPEC US  GUIDE 03/26/2024 [facility] MAMMOGRAPHY   DECOMPRESSIVE LUMBAR LAMINECTOMY LEVEL 1 N/A 11/20/2023   Procedure: lumbar laminectomy right Lumbar five with decompression of nerve roots and excision of tumor;  Surgeon: Colon Shove, MD;  Location: [facility] OR;  Service: Neurosurgery;  Laterality: N/A;   IR RADIOLOGIST EVAL & MGMT  01/25/2020   IR RADIOLOGIST EVAL & MGMT  03/21/2020   IR RADIOLOGIST EVAL & MGMT  06/07/2020   IR RADIOLOGIST EVAL & MGMT  09/05/2020   IR RADIOLOGIST EVAL & MGMT  12/05/2020   IR RADIOLOGIST EVAL & MGMT  05/09/2021   IR RADIOLOGIST EVAL & MGMT  09/06/2021   IR RADIOLOGIST EVAL & MGMT  12/12/2021   IR RADIOLOGIST EVAL & MGMT  06/25/2022   IR RADIOLOGIST EVAL & MGMT  07/24/2022   RADIOLOGY WITH ANESTHESIA N/A 03/01/2020   Procedure: CT WITH ANESTHESIA  THERMAL MICROWAVE  ABLATIION;  Surgeon: Karalee Beat, MD;  Location: [facility] ORS;  Service: Anesthesiology;   Laterality: N/A;   RADIOLOGY WITH ANESTHESIA N/A 07/17/2022   Procedure: CT MICROWAVE ABLATION;  Surgeon: Karalee Beat POUR, MD;  Location: [facility] ORS;  Service: Radiology;  Laterality: N/A;    I have reviewed the social history and family history with the patient and they are unchanged from previous note.  ALLERGIES:  has no known allergies.  MEDICATIONS:  Current Outpatient Medications  Medication Sig Dispense Refill   acetaminophen  (TYLENOL ) 500 MG tablet Take 1,000 mg by mouth every 6 (six) hours as needed (pain.).     aspirin  EC 81 MG tablet Take 1 tablet (81 mg total) by mouth daily. Swallow whole. 30 tablet 12   atorvastatin  (LIPITOR) 40 MG tablet Take 1 tablet (40 mg total) by mouth daily. 30 tablet 11   cabozantinib (CABOMETYX ) 60 MG tablet Take 1 tablet (60 mg total) by mouth daily. TAKE 1 TABLET BY MOUTH 1 TIME  A DAY ON AN EMPTY STOMACH, 1 HOUR BEFORE OR 2 HOURS AFTER MEALS. 30 tablet 1   furosemide  (LASIX ) 40 MG tablet Take 1 tablet (40 mg total) by mouth daily. (Patient taking differently: Take 40 mg by mouth every evening. After work) 30 tablet 11   hydrALAZINE  (APRESOLINE ) 10 MG tablet TAKE 1 TABLET 2 (TWO) TIMES DAILY AS NEEDED. TAKE FOR SYSTOLIC BLOOD PRESSURE MORE THAN 170. (Patient taking differently: Take 10 mg by mouth 2 (two) times daily as needed (SYSTOLIC BLOOD PRESSURE MORE THAN 170).) 180 tablet 3   olmesartan  (BENICAR ) 20 MG tablet Take 1 tablet (20 mg total) by mouth daily. 30 tablet 11   potassium chloride  SA (KLOR-CON  M) 20 MEQ tablet Take 1 tablet (20 mEq total) by mouth daily. (Patient taking differently: Take 20 mEq by mouth every evening. After work) 30 tablet 0   No current facility-administered medications for this visit.    PHYSICAL EXAMINATION: ECOG PERFORMANCE STATUS: 1 - Symptomatic but completely ambulatory  Vitals:   08/03/24 1008  BP: (!) 118/58  Pulse: 81  Resp: 16  Temp: 97.9 F (36.6 C)  SpO2: 98%   Wt Readings from Last 3 Encounters:   08/03/24 126 lb 12.8 oz (57.5 kg)  07/23/24 126 lb 12.8 oz (57.5 kg)  07/13/24 128 lb 12.8 oz (58.4 kg)     GENERAL:alert, no distress and comfortable SKIN: skin color, texture, turgor are normal, no rashes or significant lesions EYES: normal, Conjunctiva are pink and non-injected, sclera clear NECK: supple, thyroid  normal size, non-tender, without nodularity LYMPH:  no palpable lymphadenopathy in the cervical, axillary  LUNGS: clear to auscultation and percussion with normal breathing effort HEART: regular rate & rhythm and no murmurs and no lower extremity edema ABDOMEN:abdomen soft, non-tender and normal bowel sounds Musculoskeletal:no cyanosis of digits and no clubbing  NEURO: alert & oriented x 3 with fluent speech, no focal motor/sensory deficits  Physical Exam    LABORATORY DATA:  I have reviewed the data as listed    Latest Ref Rng & Units 08/03/2024    9:37 AM 07/27/2024    1:12 PM 07/13/2024    1:59 PM  CBC  WBC 4.0 - 10.5 K/uL 4.0  3.4  3.0   Hemoglobin 13.0 - 17.0 g/dL 87.6  88.7  85.7   Hematocrit 39.0 - 52.0 % 38.2  34.4  42.4   Platelets 150 - 400 K/uL 187  163  140         Latest Ref Rng & Units 08/03/2024    9:37 AM 07/13/2024    1:59 PM 07/12/2024    5:07 AM  CMP  Glucose 70 - 99 mg/dL 896  886  898   BUN 8 - 23 mg/dL 22  23  18    Creatinine 0.61 - 1.24 mg/dL 8.95  8.90  8.72   Sodium 135 - 145 mmol/L 143  143  145   Potassium 3.5 - 5.1 mmol/L 4.2  3.9  3.4   Chloride 98 - 111 mmol/L 109  111  109   CO2 22 - 32 mmol/L 29  25  23    Calcium  8.9 - 10.3 mg/dL 89.9  9.0  9.1   Total Protein 6.5 - 8.1 g/dL 7.7  7.1    Total Bilirubin 0.0 - 1.2 mg/dL 0.5  1.0    Alkaline Phos 38 - 126 U/L 489  823    AST 15 - 41 U/L 78  140    ALT 0 - 44  U/L 92  106        RADIOGRAPHIC STUDIES: I have personally reviewed the radiological images as listed and agreed with the findings in the report. No results found.    No orders of the defined types were placed in  this encounter.  All questions were answered. The patient knows to call the clinic with any problems, questions or concerns. No barriers to learning was detected. The total time spent in the appointment was 25 minutes, including review of chart and various tests results, discussions about plan of care and coordination of care plan     Onita Mattock, MD 08/03/2024

## 2024-08-04 ENCOUNTER — Telehealth: Payer: Self-pay | Admitting: Hematology

## 2024-08-04 ENCOUNTER — Telehealth: Payer: Self-pay | Admitting: *Deleted

## 2024-08-04 NOTE — Telephone Encounter (Signed)
 Cardiac Catheterization scheduled at [facility] for: Thursday August 05, 2024 7:30 AM Arrival time [facility] Main Entrance A at: 5:30 AM  Diet: -Nothing to eat after midnight.  Hydration: -May drink clear liquids until 2 hours before the procedure.  Approved liquids: Water , clear tea, black coffee, fruit juices-non-citric and without pulp,Gatorade, plain Jello/popsicles.   -Please drink 16 oz of water  2 hours before procedure.  Medication instructions: -Hold:  Lasix /KCl-AM of procedure -Other usual morning medications can be taken including aspirin  81 mg.  Plan to go home the same day, you will only stay overnight if medically necessary.  You must have responsible adult to drive you home.  Someone must be with you the first 24 hours after you arrive home.  Left detailed message (DPR) with procedure instructions.

## 2024-08-04 NOTE — Telephone Encounter (Signed)
 Lvm regarding upcoming appt

## 2024-08-05 ENCOUNTER — Encounter (HOSPITAL_COMMUNITY)
Admission: RE | Disposition: A | Discharge status: Home / Self Care | Payer: Self-pay | Source: Home / Self Care | Attending: Cardiovascular Disease

## 2024-08-05 ENCOUNTER — Other Ambulatory Visit: Payer: Self-pay

## 2024-08-05 ENCOUNTER — Ambulatory Visit (HOSPITAL_COMMUNITY)
Admission: RE | Admit: 2024-08-05 | Discharge: 2024-08-05 | Disposition: A | Discharge status: Home / Self Care | Attending: Cardiovascular Disease | Admitting: Cardiovascular Disease

## 2024-08-05 DIAGNOSIS — I509 Heart failure, unspecified: Secondary | ICD-10-CM | POA: Insufficient documentation

## 2024-08-05 DIAGNOSIS — I110 Hypertensive heart disease with heart failure: Secondary | ICD-10-CM | POA: Diagnosis not present

## 2024-08-05 DIAGNOSIS — I2584 Coronary atherosclerosis due to calcified coronary lesion: Secondary | ICD-10-CM | POA: Diagnosis not present

## 2024-08-05 DIAGNOSIS — I350 Nonrheumatic aortic (valve) stenosis: Secondary | ICD-10-CM | POA: Insufficient documentation

## 2024-08-05 DIAGNOSIS — I2510 Atherosclerotic heart disease of native coronary artery without angina pectoris: Secondary | ICD-10-CM | POA: Insufficient documentation

## 2024-08-05 LAB — POCT I-STAT 7, (LYTES, BLD GAS, ICA,H+H)
Acid-base deficit: 3 mmol/L — ABNORMAL HIGH (ref 0.0–2.0)
Bicarbonate: 22.6 mmol/L (ref 20.0–28.0)
Calcium, Ion: 1.27 mmol/L (ref 1.15–1.40)
HCT: 34 % — ABNORMAL LOW (ref 39.0–52.0)
Hemoglobin: 11.6 g/dL — ABNORMAL LOW (ref 13.0–17.0)
O2 Saturation: 92 %
Potassium: 4.7 mmol/L (ref 3.5–5.1)
Sodium: 143 mmol/L (ref 135–145)
TCO2: 24 mmol/L (ref 22–32)
pCO2 arterial: 40.3 mmHg (ref 32–48)
pH, Arterial: 7.357 (ref 7.35–7.45)
pO2, Arterial: 67 mmHg — ABNORMAL LOW (ref 83–108)

## 2024-08-05 LAB — POCT I-STAT EG7
Acid-base deficit: 2 mmol/L (ref 0.0–2.0)
Bicarbonate: 23.8 mmol/L (ref 20.0–28.0)
Calcium, Ion: 1.27 mmol/L (ref 1.15–1.40)
HCT: 34 % — ABNORMAL LOW (ref 39.0–52.0)
Hemoglobin: 11.6 g/dL — ABNORMAL LOW (ref 13.0–17.0)
O2 Saturation: 63 %
Potassium: 4.7 mmol/L (ref 3.5–5.1)
Sodium: 143 mmol/L (ref 135–145)
TCO2: 25 mmol/L (ref 22–32)
pCO2, Ven: 43.4 mmHg — ABNORMAL LOW (ref 44–60)
pH, Ven: 7.348 (ref 7.25–7.43)
pO2, Ven: 34 mmHg (ref 32–45)

## 2024-08-05 SURGERY — RIGHT/LEFT HEART CATH AND CORONARY ANGIOGRAPHY
Anesthesia: LOCAL

## 2024-08-05 MED ORDER — VERAPAMIL HCL 2.5 MG/ML IV SOLN
INTRAVENOUS | Status: DC | PRN
  Administered 2024-08-05: 10 mL via INTRA_ARTERIAL

## 2024-08-05 MED ORDER — HYDRALAZINE HCL 20 MG/ML IJ SOLN: 10.0000 mg | INTRAMUSCULAR | Status: DC | PRN

## 2024-08-05 MED ORDER — MIDAZOLAM HCL 2 MG/2ML IJ SOLN
INTRAMUSCULAR | Status: DC | PRN
  Administered 2024-08-05 (×2): 1 mg via INTRAVENOUS

## 2024-08-05 MED ORDER — HEPARIN SODIUM (PORCINE) 1000 UNIT/ML IJ SOLN
INTRAMUSCULAR | Status: DC | PRN
  Administered 2024-08-05: 4000 [IU] via INTRAVENOUS

## 2024-08-05 MED ORDER — HEPARIN (PORCINE) IN NACL 1000-0.9 UT/500ML-% IV SOLN
INTRAVENOUS | Status: DC | PRN
  Administered 2024-08-05 (×3): 500 mL

## 2024-08-05 MED ORDER — VERAPAMIL HCL 2.5 MG/ML IV SOLN
INTRAVENOUS | Status: AC
Start: 2024-08-05 — End: 2024-08-05
  Filled 2024-08-05: qty 2

## 2024-08-05 MED ORDER — FENTANYL CITRATE (PF) 100 MCG/2ML IJ SOLN
INTRAMUSCULAR | Status: AC
  Filled 2024-08-05: qty 2

## 2024-08-05 MED ORDER — MIDAZOLAM HCL 2 MG/2ML IJ SOLN
INTRAMUSCULAR | Status: AC
  Filled 2024-08-05: qty 2

## 2024-08-05 MED ORDER — SODIUM CHLORIDE 0.9 % IV SOLN: 250.0000 mL | INTRAVENOUS | Status: DC | PRN

## 2024-08-05 MED ORDER — LIDOCAINE HCL (PF) 1 % IJ SOLN
INTRAMUSCULAR | Status: AC
Start: 2024-08-05 — End: 2024-08-05
  Filled 2024-08-05: qty 30

## 2024-08-05 MED ORDER — SODIUM CHLORIDE 0.9% FLUSH: 3.0000 mL | Freq: Two times a day (BID) | INTRAVENOUS | Status: DC

## 2024-08-05 MED ORDER — FREE WATER: 500.0000 mL | Freq: Once | Status: DC

## 2024-08-05 MED ORDER — ONDANSETRON HCL 4 MG/2ML IJ SOLN: 4.0000 mg | Freq: Four times a day (QID) | INTRAMUSCULAR | Status: DC | PRN

## 2024-08-05 MED ORDER — LIDOCAINE HCL (PF) 1 % IJ SOLN
INTRAMUSCULAR | Status: DC | PRN
  Administered 2024-08-05: 5 mL

## 2024-08-05 MED ORDER — SODIUM CHLORIDE 0.9% FLUSH: 3.0000 mL | INTRAVENOUS | Status: DC | PRN

## 2024-08-05 MED ORDER — ASPIRIN 81 MG PO CHEW: 81.0000 mg | CHEWABLE_TABLET | ORAL | Status: AC

## 2024-08-05 MED ORDER — ACETAMINOPHEN 325 MG PO TABS: 650.0000 mg | ORAL_TABLET | ORAL | Status: DC | PRN

## 2024-08-05 MED ORDER — FENTANYL CITRATE (PF) 100 MCG/2ML IJ SOLN
INTRAMUSCULAR | Status: DC | PRN
  Administered 2024-08-05 (×2): 25 ug via INTRAVENOUS

## 2024-08-05 MED ORDER — SODIUM CHLORIDE 0.9% FLUSH
3.0000 mL | Freq: Two times a day (BID) | INTRAVENOUS | Status: DC
Start: 2024-08-05 — End: 2024-08-05

## 2024-08-05 MED ORDER — LABETALOL HCL 5 MG/ML IV SOLN: 10.0000 mg | INTRAVENOUS | Status: DC | PRN

## 2024-08-05 MED ORDER — HEPARIN SODIUM (PORCINE) 1000 UNIT/ML IJ SOLN
INTRAMUSCULAR | Status: AC
  Filled 2024-08-05: qty 10

## 2024-08-05 MED ORDER — IOHEXOL 350 MG/ML SOLN
INTRAVENOUS | Status: DC | PRN
  Administered 2024-08-05: 45 mL

## 2024-08-05 SURGICAL SUPPLY — 13 items
CATH 5FR JL3.5 JR4 ANG PIG MP (CATHETERS) IMPLANT
CATH BALLN WEDGE 5F 110CM (CATHETERS) IMPLANT
CATH INFINITI 5 FR AL2 (CATHETERS) IMPLANT
CATH INFINITI 5FR AL1 (CATHETERS) IMPLANT
DEVICE RAD TR BAND REGULAR (VASCULAR PRODUCTS) IMPLANT
GLIDESHEATH SLEND SS 6F .021 (SHEATH) IMPLANT
GUIDEWIRE INQWIRE 1.5J.035X260 (WIRE) IMPLANT
KIT SYRINGE INJ CVI SPIKEX1 (MISCELLANEOUS) IMPLANT
PACK CARDIAC CATHETERIZATION (CUSTOM PROCEDURE TRAY) ×1 IMPLANT
SET ATX-X65L (MISCELLANEOUS) IMPLANT
SHEATH GLIDE SLENDER 4/5FR (SHEATH) IMPLANT
SHEATH PROBE COVER 6X72 (BAG) IMPLANT
WIRE EMERALD ST .035X150CM (WIRE) IMPLANT

## 2024-08-05 NOTE — Interval H&P Note (Signed)
 History and Physical Interval Note:  08/05/2024 7:43 AM  Corey Tarri Sierra Sr.  has presented today for surgery, with the diagnosis of severe AS.  The various methods of treatment have been discussed with the patient and family. After consideration of risks, benefits and other options for treatment, the patient has consented to  Procedure(s): RIGHT/LEFT HEART CATH AND CORONARY ANGIOGRAPHY (N/A) as a surgical intervention.  The patient's history has been reviewed, patient examined, no change in status, stable for surgery.  I have reviewed the patient's chart and labs.  Questions were answered to the patient's satisfaction.     [name]

## 2024-08-05 NOTE — Progress Notes (Signed)
 Discharge instructions reviewed with patient an ex wife Maryetta at the bedside, denies questions or concerns at this time. TR Band removed, no s/s of complications. PT ambulated in the hallway to bathroom, was able to void without difficulty. Tolerated PO intake. PT was escorted from the unit via wheelchair to personal vehicle.

## 2024-08-05 NOTE — Discharge Instructions (Signed)

## 2024-08-06 ENCOUNTER — Encounter (HOSPITAL_COMMUNITY): Payer: Self-pay | Admitting: Cardiovascular Disease

## 2024-08-09 ENCOUNTER — Ambulatory Visit (HOSPITAL_COMMUNITY)
Admission: RE | Admit: 2024-08-09 | Discharge: 2024-08-09 | Disposition: A | Discharge status: Home / Self Care | Source: Ambulatory Visit | Attending: Cardiovascular Disease | Admitting: Cardiovascular Disease

## 2024-08-09 DIAGNOSIS — Z01810 Encounter for preprocedural cardiovascular examination: Secondary | ICD-10-CM | POA: Diagnosis not present

## 2024-08-09 DIAGNOSIS — I2510 Atherosclerotic heart disease of native coronary artery without angina pectoris: Secondary | ICD-10-CM | POA: Diagnosis not present

## 2024-08-09 DIAGNOSIS — Z01818 Encounter for other preprocedural examination: Secondary | ICD-10-CM | POA: Diagnosis present

## 2024-08-09 DIAGNOSIS — C220 Liver cell carcinoma: Secondary | ICD-10-CM | POA: Diagnosis not present

## 2024-08-09 DIAGNOSIS — C7951 Secondary malignant neoplasm of bone: Secondary | ICD-10-CM | POA: Insufficient documentation

## 2024-08-09 DIAGNOSIS — I708 Atherosclerosis of other arteries: Secondary | ICD-10-CM | POA: Diagnosis not present

## 2024-08-09 DIAGNOSIS — J90 Pleural effusion, not elsewhere classified: Secondary | ICD-10-CM | POA: Insufficient documentation

## 2024-08-09 DIAGNOSIS — I350 Nonrheumatic aortic (valve) stenosis: Secondary | ICD-10-CM | POA: Diagnosis not present

## 2024-08-09 DIAGNOSIS — I70201 Unspecified atherosclerosis of native arteries of extremities, right leg: Secondary | ICD-10-CM | POA: Insufficient documentation

## 2024-08-09 MED ORDER — IOHEXOL 350 MG/ML SOLN
100.0000 mL | Freq: Once | INTRAVENOUS | Status: AC | PRN
  Administered 2024-08-09: 100 mL via INTRAVENOUS

## 2024-08-15 ENCOUNTER — Ambulatory Visit: Payer: Self-pay | Admitting: Cardiovascular Disease

## 2024-08-19 NOTE — Progress Notes (Signed)
 LVM for pt requesting pt to give Dr. Demetra office a call back so pt can be updated on plans for surgery and chemo.  Stated Dr Lanny would also like to schedule a lab & f/u 1 to 2 wks post surgery which this nurse would like to schedule with pt during update of plans for TAVR Surgery.  Stated Dr. Lanny would also like to cancel the pt's tx on 09/02/2024.  Requested if pt would please return call so detailed care plan update can be made with pt.  Awaiting return call from pt.

## 2024-08-20 ENCOUNTER — Other Ambulatory Visit (HOSPITAL_COMMUNITY)

## 2024-08-20 ENCOUNTER — Encounter: Admitting: Thoracic Surgery (Cardiothoracic Vascular Surgery)

## 2024-08-25 ENCOUNTER — Telehealth: Payer: Self-pay | Admitting: Hematology

## 2024-08-25 ENCOUNTER — Telehealth: Payer: Self-pay

## 2024-08-25 NOTE — Telephone Encounter (Signed)
 ----- Message from Nurse Tinnie B sent at 08/24/2024 12:04 PM EDT ----- Regarding: RE: Planning TAVR surgery Hey,  I spoke with the patient last week and advised him about plans in regards to cardiac surgery timing.  I did not speak with him about rescheduling his 10/23 appointments. The pt has my chart if you want to send him a message to contact you in regards to rescheduling.  I have had to use my chart when he hasn't returned my calls.   Thanks, Lauren ----- Message ----- From: Seena Anders SQUIBB, RN Sent: 08/24/2024  11:59 AM EDT To: Tinnie LELON Daring, RN; Onita Mattock, MD; Con RAMAN S# Subject: RE: Planning TAVR surgery                      Good morning,  Have anyone spoken with this pt?  I have tried twice and have left voicemails but he has not returned by calls.  I want to make sure he's aware of everything discussed below. ----- Message ----- From: Mattock Onita, MD Sent: 08/17/2024   5:41 PM EDT To: Tinnie LELON Daring, RN; Anders SQUIBB Seena, RN; Ba# Subject: RE: Planning TAVR surgery                      Thanks for letting me know. I will continue holding his cancer therapy Cabozentinib until I see him back after surgery. Hope the surgery can be done in early Nov, no later than you planned on 11/18 (6 weeks from now).  Anders, please let pt know the plan and schedule lab and f/u 1-2 weeks after his surgery, and cancel his appointments on 10/23, thx  Onita Mattock ----- Message ----- From: Daring Tinnie LELON, RN Sent: 08/17/2024  12:51 PM EDT To: Onita Mattock, MD; Anders SQUIBB Seena, RN Subject: RE: Planning TAVR surgery                      I have spoken with Corey Armstrong and advised him that we cannot proceed with surgery next week. The pt will now see our cardiac surgeon, Dr Con Clunes, on 11/6 and we are tentatively planning his surgery on 11/18.   Thank you, Lauren ----- Message ----- From: Mattock Onita, MD Sent: 08/16/2024  11:58 AM EDT To: Tinnie LELON Daring, RN; Anders SQUIBB Seena, RN Subject: RE:  Planning TAVR surgery                      If the incision is 2-3 inches, please postpone it to 4 weeks from now. I told pt to stop Cabozantinib yesterday. Please call pt after you review with your cardiology team.  Onita ----- Message ----- From: Daring Tinnie LELON, RN Sent: 08/16/2024  11:00 AM EDT To: Onita Mattock, MD; Anders SQUIBB Seena, RN Subject: RE: Planning TAVR surgery                      Hey,  Normally TAVR is minimally invasive with a puncture in the femoral artery like a cardiac cath. Unfortunately, Corey Armstrong has PVD that prohibits us  from using the femoral artery as his access.  The incision for transcutaneous access of the subclavian artery is around 2-3 inches. If we need to delay surgery let me know and I can make the team aware and adjust our plan.   Thanks, Lauren ----- Message ----- From: Mattock Onita, MD Sent: 08/14/2024   9:13  AM EDT To: Tinnie LELON Daring, RN; Anders SHAUNNA Scurry, RN Subject: RE: Planning TAVR surgery                      How big is his cutaneous incision? I thought it's a minimal invasive surgery similar to cardiac cath, but it sounds likely a small surgery.   Cabozentinib may impact wound healing if it's a surgical incision. I will call him and let him stop now, then we will figure out if he is OK to proceed surgery on 10/14. He may need to stop it 4 weeks before surgery.  Jodie Callander ----- Message ----- From: Daring Tinnie LELON, RN Sent: 08/13/2024   3:07 PM EDT To: Callander Mattock, MD Subject: Planning TAVR surgery                          Hey Dr Mattock,  We are working Corey Armstrong up for TAVR and he will be a left subclavian approach which requires a cut down, our team is planning to do his surgery on 10/14.  The pt takes Cabozentinib, how far in advance does the pt need to stop this medication to reduce bleeding risks?  Thank you,  Tinnie Daring RN

## 2024-08-25 NOTE — Telephone Encounter (Signed)
 Spoke with pt via telephone to make pt aware that Dr. Lanny is going to cancel his appt on 09/02/2024 and that the plan is for the pt to have the surgery consult on 09/16/2024 and the actual surgery on 09/28/2024.  Stated Dr. Lanny will see the pt for lab and f/u 1 to 2 wks after his surgery.  Pt verbalized understanding and stated Dr. Demetra scheduler will be contacting the pt to get him scheduled for his lab and f/u appt.

## 2024-08-25 NOTE — Telephone Encounter (Signed)
 I LVM informing Corey Armstrong of his telephone appt scheduled for 12/1. I asked that he return my call to re-schedule if needed.

## 2024-09-02 ENCOUNTER — Inpatient Hospital Stay: Admitting: Hematology

## 2024-09-02 ENCOUNTER — Inpatient Hospital Stay

## 2024-09-06 ENCOUNTER — Ambulatory Visit (INDEPENDENT_AMBULATORY_CARE_PROVIDER_SITE_OTHER)

## 2024-09-06 VITALS — BP 120/52 | HR 64 | Temp 98.0°F | Ht 67.0 in | Wt 133.0 lb

## 2024-09-06 DIAGNOSIS — I509 Heart failure, unspecified: Secondary | ICD-10-CM

## 2024-09-06 DIAGNOSIS — Z7185 Encounter for immunization safety counseling: Secondary | ICD-10-CM | POA: Diagnosis not present

## 2024-09-06 DIAGNOSIS — I959 Hypotension, unspecified: Secondary | ICD-10-CM

## 2024-09-06 DIAGNOSIS — Z8639 Personal history of other endocrine, nutritional and metabolic disease: Secondary | ICD-10-CM

## 2024-09-06 DIAGNOSIS — I10 Essential (primary) hypertension: Secondary | ICD-10-CM | POA: Diagnosis not present

## 2024-09-06 LAB — BASIC METABOLIC PANEL WITH GFR
BUN: 19 mg/dL (ref 6–23)
CO2: 25 meq/L (ref 19–32)
Calcium: 9.2 mg/dL (ref 8.4–10.5)
Chloride: 109 meq/L (ref 96–112)
Creatinine, Ser: 0.95 mg/dL (ref 0.40–1.50)
GFR: 83.48 mL/min (ref >60.00)
Glucose, Bld: 89 mg/dL (ref 70–99)
Potassium: 3.6 meq/L (ref 3.5–5.1)
Sodium: 143 meq/L (ref 135–145)

## 2024-09-06 MED ORDER — POTASSIUM CHLORIDE CRYS ER 20 MEQ PO TBCR: 20.0000 meq | EXTENDED_RELEASE_TABLET | Freq: Every day | ORAL | 0 refills | Status: DC

## 2024-09-06 MED ORDER — FUROSEMIDE 40 MG PO TABS: 40.0000 mg | ORAL_TABLET | Freq: Every day | ORAL | Status: DC

## 2024-09-06 NOTE — Patient Instructions (Addendum)
 Thank you for visiting [facility] today! Here's what we talked about: - Get COVID and RSV shot from pharmacy  - Check BP daily, Low BP is anything less than 100/60. If getting low BP readings call cardiology clinic

## 2024-09-06 NOTE — Progress Notes (Signed)
 Subjective:   This visit was conducted in person. The patient gave informed consent to the use of Abridge AI technology to record the contents of the encounter as documented below.   Patient ID: Corey Tarri Sierra Sr., male    DOB: September 20, 1956, 68 y.o.   MRN: [id number]   Discussed the use of AI scribe software for clinical note transcription with the patient, who gave verbal consent to proceed.  History of Present Illness Corey Kempner Sr. is a 68 year old male with liver cancer and heart issues who presents for a follow-up visit to discuss his current health status and medication management.  He is currently undergoing treatment for liver cancer, which is being managed by Dr Lanny. His liver cancer medication has been paused temporarily due to an upcoming heart procedure.  He has a history of heart issues and is scheduled for a heart procedure in the coming weeks. His current medications include aspirin , Lipitor, hydralazine , olmesartan , and Tylenol  for pain as needed. He takes Lasix  (furosemide ) as needed and has recently run out of potassium supplements, which were initially prescribed during a hospital visit. His potassium levels were normal in September, and he has not experienced any recent dizziness or low blood pressure symptoms.  He quit smoking over twenty years ago after smoking for about twenty years. He monitors his blood pressure daily, with systolic pressure not less than 100 and diastolic pressure usually around 64-65. No recent dizziness, although he did have an episode of dizziness many years ago following back surgery.  He has declined the flu vaccine due to a past experience where he developed pneumonia after receiving it approximately 15-20 years ago. He is open to receiving the COVID and RSV vaccines, which he plans to get from the pharmacy.      Review of Systems  All other systems reviewed and are negative.       No Known Allergies  Current  Outpatient Medications on File Prior to Visit  Medication Sig Dispense Refill   acetaminophen  (TYLENOL ) 500 MG tablet Take 1,000 mg by mouth every 6 (six) hours as needed (pain.).     aspirin  EC 81 MG tablet Take 1 tablet (81 mg total) by mouth daily. Swallow whole. 30 tablet 12   atorvastatin  (LIPITOR) 40 MG tablet Take 1 tablet (40 mg total) by mouth daily. 30 tablet 11   hydrALAZINE  (APRESOLINE ) 10 MG tablet TAKE 1 TABLET 2 (TWO) TIMES DAILY AS NEEDED. TAKE FOR SYSTOLIC BLOOD PRESSURE MORE THAN 170. (Patient taking differently: Take 10 mg by mouth 2 (two) times daily as needed (SYSTOLIC BLOOD PRESSURE MORE THAN 170).) 180 tablet 3   olmesartan  (BENICAR ) 20 MG tablet Take 1 tablet (20 mg total) by mouth daily. 30 tablet 11   No current facility-administered medications on file prior to visit.    BP (!) 120/52 (BP Location: Left Arm, Patient Position: Sitting, Cuff Size: Normal)   Pulse 64   Temp 98 F (36.7 C) (Oral)   Ht 5' 7 (1.702 m)   Wt 133 lb (60.3 kg)   SpO2 99%   BMI 20.83 kg/m   Objective:      Physical Exam  GENERAL: Alert, cooperative, well developed, no acute distress. HEAD: Normocephalic atraumatic. EYES: Extraocular movements intact BL, conjunctivae normal BL. EXTREMITIES: No cyanosis or edema. NEUROLOGICAL: Oriented to person, place and time, no gait abnormalities, moves all extremities without gross motor or sensory deficit.         Assessment & Plan:  Assessment & Plan  Acute congestive heart failure, unspecified type Hx of Hypokalemia Hypotension Blood pressure 122/58, slightly hypotensive, repeat was 120/52, asymptomatic.  Counseled patient to continue daily BP checks, his medications are managed by cardiology, instructed on low BP parameters and that if he is getting these readings to call their office for medication adjustment. No signs of acute exacerbation on my exam. Currently continues on potassium supplement, potassium levels normal in  September.  Will repeat BMP today for monitoring, supplementation discontinued last week since he ran out, refilled today as cardiology wanted this is a daily medication per chart review.  - Order BMP to assess current potassium level. - Continue olmesartan  20 mg daily. - Continue Lasix  40 mg daily - Instruct to continue daily home blood pressure monitoring. - Advise to contact clinic if blood pressure readings are less than 100/60.  Vaccine counseling Declined flu vaccination due to past pneumonia post-vaccination. Educated on flu vaccination importance with cancer treatment. Agreed to COVID and RSV vaccines.  - Instruct to obtain COVID vaccine from pharmacy. - Instruct to obtain RSV vaccine from pharmacy.     Return in about 8 months (around 05/10/2025) for CPE, schedule for fasting labs 1 week prior.   Corey Karger K Deshea Pooley, MD  09/06/24     Contains text generated by Abridge.

## 2024-09-12 ENCOUNTER — Ambulatory Visit: Payer: Self-pay

## 2024-09-13 ENCOUNTER — Ambulatory Visit (INDEPENDENT_AMBULATORY_CARE_PROVIDER_SITE_OTHER): Admitting: Cardiovascular Disease

## 2024-09-13 ENCOUNTER — Encounter (HOSPITAL_BASED_OUTPATIENT_CLINIC_OR_DEPARTMENT_OTHER): Payer: Self-pay | Admitting: Cardiovascular Disease

## 2024-09-13 VITALS — BP 120/42 | HR 74 | Ht 67.0 in | Wt 132.6 lb

## 2024-09-13 DIAGNOSIS — G459 Transient cerebral ischemic attack, unspecified: Secondary | ICD-10-CM

## 2024-09-13 DIAGNOSIS — E785 Hyperlipidemia, unspecified: Secondary | ICD-10-CM | POA: Diagnosis not present

## 2024-09-13 DIAGNOSIS — I350 Nonrheumatic aortic (valve) stenosis: Secondary | ICD-10-CM | POA: Diagnosis not present

## 2024-09-13 DIAGNOSIS — I10 Essential (primary) hypertension: Secondary | ICD-10-CM | POA: Diagnosis not present

## 2024-09-13 MED ORDER — METOPROLOL SUCCINATE ER 25 MG PO TB24: 25.0000 mg | ORAL_TABLET | Freq: Every day | ORAL | 3 refills | Status: AC

## 2024-09-13 NOTE — Patient Instructions (Addendum)
 Medication Instructions:  START METOPROLOL SUCC 25 MG DAILY   *If you need a refill on your cardiac medications before your next appointment, please call your pharmacy*  Lab Work: FASTING LP/CMET WHEN YOU GO FOR LABS   If you have labs (blood work) drawn today and your tests are completely normal, you will receive your results only by: MyChart Message (if you have MyChart) OR A paper copy in the mail If you have any lab test that is abnormal or we need to change your treatment, we will call you to review the results.  Testing/Procedures: NONE  Follow-Up: At [facility], you and your health needs are our priority.  As part of our continuing mission to provide you with exceptional heart care, our providers are all part of one team.  This team includes your primary Cardiologist (physician) and Advanced Practice Providers or APPs (Physician Assistants and Nurse Practitioners) who all work together to provide you with the care you need, when you need it.  Your next appointment:   3 month(s)  Provider:   Annabella Scarce, MD, Rosaline Bane, NP, or Reche Finder, NP    We recommend signing up for the patient portal called MyChart.  Sign up information is provided on this After Visit Summary.  MyChart is used to connect with patients for Virtual Visits (Telemedicine).  Patients are able to view lab/test results, encounter notes, upcoming appointments, etc.  Non-urgent messages can be sent to your provider as well.   To learn more about what you can do with MyChart, go to [email or web address].   MONITOR BLOOD PRESSURE DAILY FOR THE NEXT 2 WEEKS, SEND MYCHART OR CALL WITH YOUR READINGS

## 2024-09-13 NOTE — Progress Notes (Signed)
 Cardiology Office Note:  .   Date:  09/13/2024  ID:  Corey Tarri Sierra Sr., DOB 02-16-1956, MRN [id number] PCP: Corey Charlie FERNS, MD  [facility] Providers Cardiologist:  Annabella Scarce, MD    History of Present Illness: .   Corey Tarri Sierra Sr. is a 68 y.o. male with medically managed obstructive coronary disease, bicuspid aortic valve, severe low flow, low gradient AS, hypertension, HFrEF, metastatic hepatocellular carcinoma, prior tobacco abuse and hypertension here for follow up.  He was first seen in the hospital 11/2023 when he was admitted with a pathological L5 fracture with cord impingement.  Cardiology was asked to see him for presurgical risk stratification given the finding of newly diagnosed severe aortic stenosis and a bicuspid valve.  He was not a candidate for aortic valve surgery at the time due to his new diagnosis of metastatic hepatocellular carcinoma.  He was noted to have a murmur since childhood.  Echo at that time revealed LVEF 60-65% with no wall motion abnormalities and grade 2 diastolic dysfunction.  He was admitted 11/2023 with strokelike symptoms.  Brain MRI was negative for stroke.  Symptoms resolved.  He was started on atorvastatin .    Mr Was admitted 06/2024 with dyspnea. [place]ler.  Pro-BNP was 7746.  Echo revealed LVEF 30-35% with severe low-flow-low gradient aortic stenosis.  He subsequently followed up with his oncology team and his prognosis with liver cancer was felt to be greater than 1 year.  Therefore he was referred to the structural heart team and underwent cardiac catheterization 07/2024.  He was found to have 80% proximal LAD and mid left circumflex stenosis with otherwise moderate disease.  Discussed the use of AI scribe software for clinical note transcription with the patient, who gave verbal consent to proceed.  History of Present Illness Mr. Mauss has been experiencing persistent leg pain since discontinuing his cancer medication,  which he attributes in part to working long hours. Despite the discomfort, he is able to manage the pain.  He has no LE edema, orthopnea or PND.   His breathing has improved recently. He is undergoing evaluation for aortic valve replacement, with a scheduled appointment with the surgical team on November 6th. He recalls that his doctors told him his echocardiogram showed a stiff aortic valve and his heart catheterization showed some blockages. No chest pain or pressure is reported.  His current medications include potassium, baby aspirin  (81 mg), atorvastatin  for cholesterol management, hydralazine  as needed for blood pressure, and olmesartan  (Benicar ). He is unsure if his cholesterol has been rechecked since it was noted to be high in January. He has not needed to use hydralazine  recently and takes olmesartan  regularly, though he is running low and concerned about refills.  He monitors his blood pressure at home, noting a recent reading of 153/75 mmHg, primarily checking in the morning. No lightheadedness or dizziness is experienced.  He participated in Halloween activities with children, indicating engagement in social activities.  ROS:  As per HPI  Studies Reviewed: SABRA       LHC/RHC 08/05/24:   Prox LAD lesion is 80% stenosed.   Mid LAD lesion is 50% stenosed.   Mid Cx lesion is 80% stenosed.   Prox RCA lesion is 50% stenosed.   Mid RCA lesion is 50% stenosed.   1.  Severe calcific aortic stenosis with mean gradient 41 mmHg, aortic valve area 0.67 cm 2.  Severe calcific 80% proximal LAD stenosis and diffuse 50% mid LAD stenosis 3.  Severe  mid circumflex stenosis after the first OM 4.  Moderate RCA stenoses without high-grade disease 5.  Right heart catheterization data: RA mean 4 mmHg RV 43/7 mmHg PA 47/16 mean 25 mmHg Pulmonary wedge pressure A-wave 17, V wave 15, mean 13 mmHg LVEDP 8 mmHg Cardiac output/index 4.6/2.7  Echo 07/11/24: 1. LVEDV, 3D, 188 mL. Apical sparring pattern  on strain polar map; in  conjunction with aortic valve disease, can be seen in ATTR-CA. Left  ventricular ejection fraction, by estimation, is 30 to 35%. Left  ventricular ejection fraction by 3D volume is 41  %. The left ventricle has moderately decreased function. The left  ventricle demonstrates global hypokinesis. The left ventricular internal  cavity size was dilated. There is mild concentric left ventricular  hypertrophy. Left ventricular diastolic  parameters are indeterminate. The average left ventricular global  longitudinal strain is -10.7 %. The global longitudinal strain is  abnormal.   2. Right ventricular systolic function is mildly reduced. The right  ventricular size is normal. There is normal pulmonary artery systolic  pressure.   3. Left atrial size was severely dilated.   4. Right atrial size was mildly dilated.   5. The mitral valve is grossly normal. Severe mitral valve regurgitation.  No evidence of mitral stenosis.   6. There appear to be multiple jets vs eccentric flow; best seen in AC3  view. DVI 0.24. The aortic valve is calcified. Aortic valve regurgitation  is moderate. Severe aortic valve stenosis. Aortic valve area, by VTI  measures 0.76 cm. Aortic valve mean  gradient measures 33.0 mmHg. Aortic valve Vmax measures 3.87 m/s.   7. The inferior vena cava is normal in size with <50% respiratory  variability, suggesting right atrial pressure of 8 mmHg.     Risk Assessment/Calculations:             Physical Exam:   VS:  BP (!) 120/42 (BP Location: Left Arm, Patient Position: Sitting, Cuff Size: Normal)   Pulse 74   Ht 5' 7 (1.702 m)   Wt 132 lb 9.6 oz (60.1 kg)   SpO2 96%   BMI 20.77 kg/m  , BMI Body mass index is 20.77 kg/m. GENERAL:  Well appearing HEENT: Pupils equal round and reactive, fundi not visualized, oral mucosa unremarkable NECK:  No jugular venous distention, waveform within normal limits, carotid upstroke brisk and symmetric, no  bruits, no thyromegaly LUNGS:  Clear to auscultation bilaterally HEART:  RRR.  PMI not displaced or sustained,S1 and S2 within normal limits, no S3, no S4, no clicks, no rubs, III/VI late-peaking systolic murmur at the LUSB ABD:  Flat, positive bowel sounds normal in frequency in pitch, no bruits, no rebound, no guarding, no midline pulsatile mass, no hepatomegaly, no splenomegaly EXT:  2 plus pulses throughout, no edema, no cyanosis no clubbing SKIN:  No rashes no nodules NEURO:  Cranial nerves II through XII grossly intact, motor grossly intact throughout PSYCH:  Cognitively intact, oriented to person place and time   ASSESSMENT AND PLAN: .    Assessment & Plan # Non-rheumatic aortic valve stenosis # Bicuspid aortic valve: Low-flow, low gradient AS.  DI 0.24.  Calculated valve area 0.76cm^2.   Systolic function is worsening and he has been admitted with a heart failure exacerbation.  He has had most of his TAVR evaluation.  He sees Dr. Daniel in a few days and I suspect that he will not be a surgical candidate.  Per his oncology team life expectancy is greater than  1 year. - Continue follow-up with Dr. Wonda and surgical team on November 6th and 18th to discuss valve replacement options.  # Coronary artery disease Heart catheterization showed severe obstructive coronary disease.  He is not having any obstructive symptoms.  Continue medical management at this time.  Will add metoprolol succinate 25 mg daily both for CAD and heart failure.  Need to avoid hypotension given his aortic valve stenosis.  Blood pressure today was pretty low but at home he reports it has been in the 150s.  I will ask him to track his blood pressures and send us  his report so that we know how to adjust his medicines. - Continue atorvastatin  for cholesterol management. - Come back for fasting lipids and CMP.  LDL goal is at least less than 70.  # Heart failure with reduced ejection fraction # Hypertension Reduced heart  function likely due to aortic valve stenosis. Beta blocker therapy recommended. Current low blood pressure but higher home readings suggest metoprolol benefit. - Started metoprolol succinate 25 mg once daily.  Continue ARB.  - Monitor blood pressure at home in the morning and evening for a couple of weeks. - Report any symptoms of lightheadedness or dizziness.  # Hyperlipidemia Cholesterol levels high in January. Atorvastatin  initiated. Recent cholesterol levels unknown. Importance of cholesterol control emphasized. - Order lipid panel during appointment with Dr. Wonda to assess current cholesterol levels.  # Hepatocellular carcinoma:  Continues on carbozantinib.        Dispo: f/u   Signed, Annabella Scarce, MD

## 2024-09-15 ENCOUNTER — Other Ambulatory Visit: Payer: Self-pay

## 2024-09-15 DIAGNOSIS — I350 Nonrheumatic aortic (valve) stenosis: Secondary | ICD-10-CM

## 2024-09-16 ENCOUNTER — Ambulatory Visit

## 2024-09-16 VITALS — BP 135/62 | HR 62 | Resp 20 | Ht 67.0 in | Wt 132.0 lb

## 2024-09-16 DIAGNOSIS — I350 Nonrheumatic aortic (valve) stenosis: Secondary | ICD-10-CM

## 2024-09-16 NOTE — Progress Notes (Signed)
 [facility]   MULTIDISCIPLINARY HEART VALVE CLINIC    [facility] Medical Record #[id number] Date of Birth: 21-Jun-1956  Referring: Wonda Sharper, MD Primary Care: [name], MD Primary Cardiologist:Tiffany Raford, MD  Chief Complaint:    Chief Complaint  Patient presents with   Aortic Stenosis    TAVR consult, review all studies    History of Present Illness:     Corey Saenz Sr. is a 68 y.o. male presents for evaluation of severe aortic stenosis in the setting of bicuspid valve and TAVR.  He also had severe MR.  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  He has undergone surgery, XRT and chemo and is currently on Cabozentinib, although this is held right now in anticipation of TAVR.  He denies chest pain, dizziness, syncope or dizziness.  He does have some palpitations when working in the yard which goes away with rest.  He works in a [employer] and is on his feet all day performing hard labor.    He has significant plaque in his bilateral external iliac arteries which preclude transfemoral access.  Both left subclavian and left carotid access look adequate.    Past Medical History:  Diagnosis Date   Headache    migraines   Hepatitis    HX OF hEP c ?    hepatocellular ca with bone mets    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    Past Surgical History:  Procedure Laterality Date   BREAST BIOPSY Left 03/26/2024   US  LT BREAST BX W LOC DEV 1ST LESION IMG BX SPEC US  GUIDE 03/26/2024 [facility] MAMMOGRAPHY   DECOMPRESSIVE LUMBAR LAMINECTOMY LEVEL 1 N/A 11/20/2023   Procedure: lumbar laminectomy right Lumbar five with decompression of nerve roots and excision of tumor;  Surgeon: Colon Shove, MD;  Location: [facility] OR;  Service: Neurosurgery;  Laterality: N/A;   IR RADIOLOGIST EVAL & MGMT  01/25/2020   IR RADIOLOGIST EVAL & MGMT  03/21/2020   IR RADIOLOGIST EVAL & MGMT  06/07/2020   IR RADIOLOGIST EVAL & MGMT   09/05/2020   IR RADIOLOGIST EVAL & MGMT  12/05/2020   IR RADIOLOGIST EVAL & MGMT  05/09/2021   IR RADIOLOGIST EVAL & MGMT  09/06/2021   IR RADIOLOGIST EVAL & MGMT  12/12/2021   IR RADIOLOGIST EVAL & MGMT  06/25/2022   IR RADIOLOGIST EVAL & MGMT  07/24/2022   RADIOLOGY WITH ANESTHESIA N/A 03/01/2020   Procedure: CT WITH ANESTHESIA  THERMAL MICROWAVE  ABLATIION;  Surgeon: Karalee Beat, MD;  Location: [facility] ORS;  Service: Anesthesiology;  Laterality: N/A;   RADIOLOGY WITH ANESTHESIA N/A 07/17/2022   Procedure: CT MICROWAVE ABLATION;  Surgeon: Karalee Beat POUR, MD;  Location: [facility] ORS;  Service: Radiology;  Laterality: N/A;   RIGHT/LEFT HEART CATH AND CORONARY ANGIOGRAPHY N/A 08/05/2024   Procedure: RIGHT/LEFT HEART CATH AND CORONARY ANGIOGRAPHY;  Surgeon: Wonda Sharper, MD;  Location: [facility] INVASIVE CV LAB;  Service: Cardiovascular;  Laterality: N/A;   SPINE SURGERY      Social History:  Social History   Tobacco Use  Smoking Status Former   Passive exposure: Current  Smokeless Tobacco Never  Tobacco Comments   3 per week    Social History   Substance and Sexual Activity  Alcohol Use Not Currently     No Known Allergies    Current Outpatient Medications  Medication Sig Dispense Refill   acetaminophen  (TYLENOL ) 500 MG  tablet Take 1,000 mg by mouth every 6 (six) hours as needed (pain.).     aspirin  EC 81 MG tablet Take 1 tablet (81 mg total) by mouth daily. Swallow whole. 30 tablet 12   atorvastatin  (LIPITOR) 40 MG tablet Take 1 tablet (40 mg total) by mouth daily. 30 tablet 11   furosemide  (LASIX ) 40 MG tablet Take 1 tablet (40 mg total) by mouth daily.     hydrALAZINE  (APRESOLINE ) 10 MG tablet TAKE 1 TABLET 2 (TWO) TIMES DAILY AS NEEDED. TAKE FOR SYSTOLIC BLOOD PRESSURE MORE THAN 170. (Patient taking differently: Take 10 mg by mouth 2 (two) times daily as needed (SYSTOLIC BLOOD PRESSURE MORE THAN 170).) 180 tablet 3   metoprolol succinate (TOPROL XL) 25 MG 24 hr tablet  Take 1 tablet (25 mg total) by mouth daily. 90 tablet 3   olmesartan  (BENICAR ) 20 MG tablet Take 1 tablet (20 mg total) by mouth daily. 30 tablet 11   pantoprazole  (PROTONIX ) 20 MG tablet Take 20 mg by mouth daily.     potassium chloride  SA (KLOR-CON  M) 20 MEQ tablet Take 1 tablet (20 mEq total) by mouth daily. 90 tablet 0   No current facility-administered medications for this visit.    (Not in a hospital admission)   Family History  Problem Relation Age of Onset   Arthritis Mother    Heart disease Brother        Passed away   Cancer Neg Hx    Diabetes Neg Hx    Drug abuse Neg Hx    Hyperlipidemia Neg Hx    Hypertension Neg Hx    Kidney disease Neg Hx    Stroke Neg Hx      Review of Systems:   Review of Systems  Constitutional:  Positive for malaise/fatigue. Negative for chills and fever.  Respiratory:  Negative for cough and shortness of breath.   Cardiovascular:  Positive for palpitations. Negative for chest pain, orthopnea and leg swelling.  Gastrointestinal:  Negative for nausea and vomiting.  Neurological:  Negative for dizziness and headaches.      Physical Exam: BP 135/62 (BP Location: Right Arm, Patient Position: Sitting, Cuff Size: Normal)   Pulse 62   Resp 20   Ht 5' 7 (1.702 m)   Wt 132 lb (59.9 kg)   SpO2 100% Comment: RA  BMI 20.67 kg/m  Physical Exam Constitutional:      Appearance: Normal appearance.     Comments: Thin but well-appearing  Cardiovascular:     Rate and Rhythm: Normal rate.     Heart sounds: Murmur heard.  Pulmonary:     Effort: Pulmonary effort is normal.  Abdominal:     Palpations: Abdomen is soft.     Tenderness: There is no abdominal tenderness.  Musculoskeletal:        General: No swelling.  Skin:    General: Skin is warm and dry.  Neurological:     General: No focal deficit present.     Mental Status: He is alert.       Cardiac Studies & Procedures    ______________________________________________________________________________________________ CARDIAC CATHETERIZATION  CARDIAC CATHETERIZATION 08/05/2024  Conclusion   Prox LAD lesion is 80% stenosed.   Mid LAD lesion is 50% stenosed.   Mid Cx lesion is 80% stenosed.   Prox RCA lesion is 50% stenosed.   Mid RCA lesion is 50% stenosed.  1.  Severe calcific aortic stenosis with mean gradient 41 mmHg, aortic valve area 0.67 cm 2.  Severe  calcific 80% proximal LAD stenosis and diffuse 50% mid LAD stenosis 3.  Severe mid circumflex stenosis after the first OM 4.  Moderate RCA stenoses without high-grade disease 5.  Right heart catheterization data: RA mean 4 mmHg RV 43/7 mmHg PA 47/16 mean 25 mmHg Pulmonary wedge pressure A-wave 17, V wave 15, mean 13 mmHg LVEDP 8 mmHg Cardiac output/index 4.6/2.7  Recommendations: Multidisciplinary heart team reviewed.  Findings Coronary Findings Diagnostic  Dominance: Right  Left Anterior Descending The vessel is severely calcified. The LAD is densely calcified.  The proximal vessel has a 80% eccentric stenosis with heavy calcium .  The mid LAD has diffuse 50% stenosis. Prox LAD lesion is 80% stenosed. Mid LAD lesion is 50% stenosed.  Left Circumflex The large portions of the proximal circumflex and first OM are patent with minor nonobstructive disease.  The mid circumflex beyond the OM origin has an 80 to 90% stenosis that is focal.  It extends into posterolateral branch. Mid Cx lesion is 80% stenosed.  Right Coronary Artery There is moderate diffuse disease throughout the vessel. The RCA is diffusely diseased with no severe stenoses.  There is moderate diffuse plaquing of about 50% throughout the proximal and mid portions. Prox RCA lesion is 50% stenosed. Mid RCA lesion is 50% stenosed.  Intervention  No interventions have been documented.     ECHOCARDIOGRAM  ECHOCARDIOGRAM COMPLETE 07/11/2024  Narrative ECHOCARDIOGRAM  REPORT    Patient Name:   Corey Lemme Sr. Date of Exam: 07/11/2024 Medical Rec #:  [id number]                  Height:       67.0 in Accession #:    [id number]                 Weight:       124.1 lb Date of Birth:  January 20, 1956                   BSA:          1.651 m Patient Age:    68 years                   BP:           118/63 mmHg Patient Gender: M                          HR:           71 bpm. Exam Location:  Inpatient  Procedure: 2D Echo, 3D Echo, Cardiac Doppler, Color Doppler and Strain Analysis (Both Spectral and Color Flow Doppler were utilized during procedure).  Indications:    CHF  History:        Patient has prior history of Echocardiogram examinations, most recent 11/19/2023. TIA, Aortic Valve Disease; Risk Factors:Hypertension.  Sonographer:    Therisa Crouch Referring Phys: [id number] [facility]  IMPRESSIONS   1. LVEDV, 3D, 188 mL. Apical sparring pattern on strain polar map; in conjunction with aortic valve disease, can be seen in ATTR-CA. Left ventricular ejection fraction, by estimation, is 30 to 35%. Left ventricular ejection fraction by 3D volume is 41 %. The left ventricle has moderately decreased function. The left ventricle demonstrates global hypokinesis. The left ventricular internal cavity size was dilated. There is mild concentric left ventricular hypertrophy. Left ventricular diastolic parameters are indeterminate. The average left ventricular global longitudinal strain is -10.7 %. The global longitudinal strain is  abnormal. 2. Right ventricular systolic function is mildly reduced. The right ventricular size is normal. There is normal pulmonary artery systolic pressure. 3. Left atrial size was severely dilated. 4. Right atrial size was mildly dilated. 5. The mitral valve is grossly normal. Severe mitral valve regurgitation. No evidence of mitral stenosis. 6. There appear to be multiple jets vs eccentric flow; best seen in AC3 view. DVI 0.24. The  aortic valve is calcified. Aortic valve regurgitation is moderate. Severe aortic valve stenosis. Aortic valve area, by VTI measures 0.76 cm. Aortic valve mean gradient measures 33.0 mmHg. Aortic valve Vmax measures 3.87 m/s. 7. The inferior vena cava is normal in size with <50% respiratory variability, suggesting right atrial pressure of 8 mmHg.  Comparison(s): Prior images reviewed side by side. Internal progression of disease with worsening ventricular function and mitral regurgitation.  FINDINGS Left Ventricle: LVEDV, 3D, 188 mL. Apical sparring pattern on strain polar map; in conjunction with aortic valve disease, can be seen in ATTR-CA. Left ventricular ejection fraction, by estimation, is 30 to 35%. Left ventricular ejection fraction by 3D volume is 41 %. The left ventricle has moderately decreased function. The left ventricle demonstrates global hypokinesis. The average left ventricular global longitudinal strain is -10.7 %. Strain was performed and the global longitudinal strain is abnormal. The left ventricular internal cavity size was dilated. There is mild concentric left ventricular hypertrophy. Left ventricular diastolic parameters are indeterminate.  Right Ventricle: The right ventricular size is normal. No increase in right ventricular wall thickness. Right ventricular systolic function is mildly reduced. There is normal pulmonary artery systolic pressure. The tricuspid regurgitant velocity is 2.48 m/s, and with an assumed right atrial pressure of 8 mmHg, the estimated right ventricular systolic pressure is 32.6 mmHg.  Left Atrium: Left atrial size was severely dilated.  Right Atrium: Right atrial size was mildly dilated.  Pericardium: Trivial pericardial effusion is present. The pericardial effusion is posterior to the left ventricle.  Mitral Valve: The mitral valve is grossly normal. Severe mitral valve regurgitation. No evidence of mitral valve stenosis. MV peak gradient, 136.9  mmHg. The mean mitral valve gradient is 92.0 mmHg.  Tricuspid Valve: The tricuspid valve is normal in structure. Tricuspid valve regurgitation is not demonstrated. No evidence of tricuspid stenosis.  Aortic Valve: There appear to be multiple jets vs eccentric flow; best seen in AC3 view. DVI 0.24. The aortic valve is calcified. Aortic valve regurgitation is moderate. Aortic regurgitation PHT measures 311 msec. Severe aortic stenosis is present. Aortic valve mean gradient measures 33.0 mmHg. Aortic valve peak gradient measures 59.9 mmHg. Aortic valve area, by VTI measures 0.76 cm.  Pulmonic Valve: The pulmonic valve was normal in structure. Pulmonic valve regurgitation is trivial. No evidence of pulmonic stenosis.  Aorta: The aortic root and ascending aorta are structurally normal, with no evidence of dilitation.  Venous: The inferior vena cava is normal in size with less than 50% respiratory variability, suggesting right atrial pressure of 8 mmHg.  IAS/Shunts: No atrial level shunt detected by color flow Doppler.  Additional Comments: 3D was performed not requiring image post processing on an independent workstation and was abnormal.   LEFT VENTRICLE PLAX 2D LVIDd:         5.70 cm         Diastology LVIDs:         4.40 cm         LV e' medial:    4.74 cm/s LV PW:  1.00 cm         LV E/e' medial:  25.3 LV IVS:        1.00 cm         LV e' lateral:   6.25 cm/s LVOT diam:     2.00 cm         LV E/e' lateral: 19.2 LV SV:         57 LV SV Index:   35              2D Longitudinal LVOT Area:     3.14 cm        Strain 2D Strain GLS   -10.7 % Avg: LV Volumes (MOD) LV vol d, MOD    180.0 ml      3D Volume EF A2C:                           LV 3D EF:    Left LV vol d, MOD    172.0 ml                   ventricul A4C:                                        ar LV vol s, MOD    113.0 ml                   ejection A2C:                                        fraction LV vol s, MOD     102.0 ml                   by 3D A4C:                                        volume is LV SV MOD A2C:   67.0 ml                    41 %. LV SV MOD A4C:   172.0 ml      LV 3D EDV:   188.00 ml LV SV MOD BP:    70.2 ml       LV 3D ESV:   111.36 ml  3D Volume EF: 3D EF:        41 %  RIGHT VENTRICLE            IVC RV Basal diam:  3.30 cm    IVC diam: 1.90 cm RV S prime:     8.18 cm/s TAPSE (M-mode): 1.5 cm  LEFT ATRIUM             Index        RIGHT ATRIUM           Index LA diam:        3.70 cm 2.24 cm/m   RA Area:     18.40 cm LA Vol (A2C):   71.1 ml 43.06 ml/m  RA Volume:   53.00 ml  32.10 ml/m LA Vol (A4C):   84.9 ml 51.42 ml/m LA Biplane Vol: 80.9 ml  49.00 ml/m AORTIC VALVE AV Area (Vmax):    0.76 cm AV Area (Vmean):   0.79 cm AV Area (VTI):     0.76 cm AV Vmax:           387.00 cm/s AV Vmean:          256.333 cm/s AV VTI:            0.753 m AV Peak Grad:      59.9 mmHg AV Mean Grad:      33.0 mmHg LVOT Vmax:         93.90 cm/s LVOT Vmean:        64.700 cm/s LVOT VTI:          0.182 m LVOT/AV VTI ratio: 0.24 AI PHT:            311 msec  AORTA Ao Root diam: 3.60 cm Ao Asc diam:  3.30 cm  MITRAL VALVE                TRICUSPID VALVE MV Area (PHT): 4.40 cm     TV Peak grad:   24.6 mmHg MV Area VTI:   0.31 cm     TV Vmax:        2.48 m/s MV Peak grad:  136.9 mmHg   TR Peak grad:   24.6 mmHg MV Mean grad:  92.0 mmHg    TR Vmax:        248.00 cm/s MV Vmax:       5.85 m/s MV Vmean:      458.0 cm/s   SHUNTS MR PISA:        2.26 cm    Systemic VTI:  0.18 m MR PISA Radius: 0.60 cm     Systemic Diam: 2.00 cm MV E velocity: 120.00 cm/s MV A velocity: 48.40 cm/s MV E/A ratio:  2.48  Stanly Leavens MD Electronically signed by Stanly Leavens MD Signature Date/Time: 07/11/2024/12:52:36 PM    Final      CT SCANS  CT CORONARY MORPH W/CTA COR W/SCORE 08/09/2024  Addendum 08/16/2024  5:44 PM ADDENDUM REPORT: 08/16/2024 17:42  ADDENDUM: The  following report is an over-read performed by radiologist Dr. Dr. Reyes Holder of [facility] on 08/16/2024. This over-read does not include interpretation of cardiac or coronary anatomy or pathology. The coronary calcium  score/coronary CTA interpretation by the cardiologist is attached.  COMPARISON:  CT July 10, 2024  FINDINGS: Vascular: No acute non-cardiac vascular finding.  Mediastinum/Nodes: No pathologically enlarged mediastinal lymph nodes.  Lungs/Pleura: Mild emphysema. Left lower lobe pneumatocele. Scattered atelectasis/scarring.  Upper Abdomen: Calcified lesion with overlying scarring in the hepatic dome on image 45/307 stable from prior CT.  Musculoskeletal: Thoracic spondylosis.  IMPRESSION: Mild emphysema.  Emphysema (ICD10-J43.9).   Electronically Signed By: Reyes Holder M.D. On: 08/16/2024 17:42  Narrative CLINICAL DATA:  Severe Aortic Stenosis.  EXAM: Cardiac TAVR CT  TECHNIQUE: A non-contrast, gated CT scan was obtained with axial slices of 2.5 mm through the heart for aortic valve scoring. A 120 kV retrospective, gated, contrast cardiac scan was obtained. Gantry rotation speed was 230 msec and collimation was 0.63 mm. Nitroglycerin was not given. The 3D dataset was reconstructed in systole with motion correction. The 3D data set was reconstructed in 5% intervals of the 0-95% of the R-R cycle. Systolic and diastolic phases were analyzed on a dedicated workstation using MPR, MIP, and VRT modes. The patient received 100 cc of contrast.  FINDINGS: Image quality: Excellent.  Noise  artifact is: Limited.  Valve Morphology: Bicuspid aortic valve with fusion of the RCC/NCC. Severe diffuse calcifications. Severe bulky calcification of the NCC. Severely restricted leaflet motion in systole.  Aortic Valve Calcium  score: 2513  Aortic annular dimension:  Phase assessed: 30%  Annular area: 454 mm2  Annular perimeter: 76.8  mm  Max diameter: 27.2 mm  Min diameter: 22.2 mm  Annular and subannular calcification: Mild layered annular calcium  under the LCC.  Membranous septum length: 2.9 mm  Optimal coplanar projection: LAO 13 CAU 13  Coronary Artery Height above Annulus:  Left Main: 13.3 mm  Right Coronary: 13.7 mm  Sinus of Valsalva Measurements:  Non-coronary: 34 mm  Right-coronary: 33 mm  Left-coronary: 36 mm  Sinus of Valsalva Height:  Non-coronary: 26.1 mm  Right-coronary: 19.8 mm  Left-coronary: 23.8 mm  Sinotubular Junction: 29 mm  Ascending Thoracic Aorta: 33 mm  Coronary Arteries: Normal coronary origin. Right dominance. The study was performed without use of NTG and is insufficient for plaque evaluation. Please refer to recent cardiac catheterization for coronary assessment.  Cardiac Morphology:  Right Atrium: Right atrial size is within normal limits.  Right Ventricle: The right ventricular cavity is within normal limits.  Left Atrium: Left atrial size is normal in size with no left atrial appendage filling defect.  Left Ventricle: The ventricular cavity size is within normal limits.  Pulmonary arteries: Normal in size without proximal filling defect.  Pulmonary veins: Normal pulmonary venous drainage.  Pericardium: Normal thickness with no significant effusion or calcium  present.  Mitral Valve: The mitral valve is normal structure without significant calcification.  Extra-cardiac findings: See attached radiology report for non-cardiac structures.  IMPRESSION: 1. Bicuspid aortic valve with fusion of the RCC/NCC.  2. Annular measurements support a 26 mm S3 or 29 mm Evolut Pro.  3. Mild layered annular calcium  under the LCC.  4. Sufficient coronary to annulus distance.  5. Optimal Fluoroscopic Angle for Delivery: LAO 13 CAU 13  Diablock T. Barbaraann, MD  Electronically Signed: By: Darryle Barbaraann M.D. On: 08/09/2024 18:49      ______________________________________________________________________________________________      ECG NSR with LVH    I have independently reviewed the above radiologic studies and discussed with the patient   Recent Lab Findings: Lab Results  Component Value Date   WBC 4.0 08/03/2024   HGB 11.6 (L) 08/05/2024   HGB 11.6 (L) 08/05/2024   HCT 34.0 (L) 08/05/2024   HCT 34.0 (L) 08/05/2024   PLT 187 08/03/2024   GLUCOSE 89 09/06/2024   CHOL 177 12/09/2023   TRIG 47 12/09/2023   HDL 45 12/09/2023   LDLCALC 123 (H) 12/09/2023   ALT 92 (H) 08/03/2024   AST 78 (H) 08/03/2024   NA 143 09/06/2024   K 3.6 09/06/2024   CL 109 09/06/2024   CREATININE 0.95 09/06/2024   BUN 19 09/06/2024   CO2 25 09/06/2024   TSH 1.820 04/07/2024   INR 1.1 12/09/2023   HGBA1C 5.4 12/08/2023      Assessment / Plan:   68 y.o. male with a bicuspid valve and severe aortic stenosis.  He also has severe MR which will hopefully improve with TAVR.  He does have metastatic HCC but is still extremely functional, his cancer precludes him from SAVR at this time.  STS score: 1.7%.  NYHA Class II.  The risks and benefits of transcarotid TAVR were discussed in detail.  The risks included death, stroke, paravalvular leak, aortic dissection, annulus rupture, device embolization, acute myocardial  infarction, arrhythmia, heart block or need for permanent pacemaker.  We also discussed possibility of an emergent sternotomy to address any procedural complications.  Based on our discussion, we collectively decided that an emergent sternotomy would not be indicated.  The patient is agreeable to proceed.  Based on my review of hig LHC, echo, and CTA, I agree with the multidisciplinary plan to proceed with a left carotid 26 mm S3 TAVR.   I  spent 45 minutes counseling the patient face to face.   Con RAMAN Durante Violett 09/16/2024 10:32 AM

## 2024-09-16 NOTE — H&P (View-Only) (Signed)
 [facility]   MULTIDISCIPLINARY HEART VALVE CLINIC    [facility] Medical Record #[id number] Date of Birth: 21-Jun-1956  Referring: Wonda Sharper, MD Primary Care: [name], MD Primary Cardiologist:Tiffany Raford, MD  Chief Complaint:    Chief Complaint  Patient presents with   Aortic Stenosis    TAVR consult, review all studies    History of Present Illness:     Marquell Saenz Sr. is a 68 y.o. male presents for evaluation of severe aortic stenosis in the setting of bicuspid valve and TAVR.  He also had severe MR.  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  He has undergone surgery, XRT and chemo and is currently on Cabozentinib, although this is held right now in anticipation of TAVR.  He denies chest pain, dizziness, syncope or dizziness.  He does have some palpitations when working in the yard which goes away with rest.  He works in a [employer] and is on his feet all day performing hard labor.    He has significant plaque in his bilateral external iliac arteries which preclude transfemoral access.  Both left subclavian and left carotid access look adequate.    Past Medical History:  Diagnosis Date   Headache    migraines   Hepatitis    HX OF hEP c ?    hepatocellular ca with bone mets    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    Past Surgical History:  Procedure Laterality Date   BREAST BIOPSY Left 03/26/2024   US  LT BREAST BX W LOC DEV 1ST LESION IMG BX SPEC US  GUIDE 03/26/2024 [facility] MAMMOGRAPHY   DECOMPRESSIVE LUMBAR LAMINECTOMY LEVEL 1 N/A 11/20/2023   Procedure: lumbar laminectomy right Lumbar five with decompression of nerve roots and excision of tumor;  Surgeon: Colon Shove, MD;  Location: [facility] OR;  Service: Neurosurgery;  Laterality: N/A;   IR RADIOLOGIST EVAL & MGMT  01/25/2020   IR RADIOLOGIST EVAL & MGMT  03/21/2020   IR RADIOLOGIST EVAL & MGMT  06/07/2020   IR RADIOLOGIST EVAL & MGMT   09/05/2020   IR RADIOLOGIST EVAL & MGMT  12/05/2020   IR RADIOLOGIST EVAL & MGMT  05/09/2021   IR RADIOLOGIST EVAL & MGMT  09/06/2021   IR RADIOLOGIST EVAL & MGMT  12/12/2021   IR RADIOLOGIST EVAL & MGMT  06/25/2022   IR RADIOLOGIST EVAL & MGMT  07/24/2022   RADIOLOGY WITH ANESTHESIA N/A 03/01/2020   Procedure: CT WITH ANESTHESIA  THERMAL MICROWAVE  ABLATIION;  Surgeon: Karalee Beat, MD;  Location: [facility] ORS;  Service: Anesthesiology;  Laterality: N/A;   RADIOLOGY WITH ANESTHESIA N/A 07/17/2022   Procedure: CT MICROWAVE ABLATION;  Surgeon: Karalee Beat POUR, MD;  Location: [facility] ORS;  Service: Radiology;  Laterality: N/A;   RIGHT/LEFT HEART CATH AND CORONARY ANGIOGRAPHY N/A 08/05/2024   Procedure: RIGHT/LEFT HEART CATH AND CORONARY ANGIOGRAPHY;  Surgeon: Wonda Sharper, MD;  Location: [facility] INVASIVE CV LAB;  Service: Cardiovascular;  Laterality: N/A;   SPINE SURGERY      Social History:  Social History   Tobacco Use  Smoking Status Former   Passive exposure: Current  Smokeless Tobacco Never  Tobacco Comments   3 per week    Social History   Substance and Sexual Activity  Alcohol Use Not Currently     No Known Allergies    Current Outpatient Medications  Medication Sig Dispense Refill   acetaminophen  (TYLENOL ) 500 MG  tablet Take 1,000 mg by mouth every 6 (six) hours as needed (pain.).     aspirin  EC 81 MG tablet Take 1 tablet (81 mg total) by mouth daily. Swallow whole. 30 tablet 12   atorvastatin  (LIPITOR) 40 MG tablet Take 1 tablet (40 mg total) by mouth daily. 30 tablet 11   furosemide  (LASIX ) 40 MG tablet Take 1 tablet (40 mg total) by mouth daily.     hydrALAZINE  (APRESOLINE ) 10 MG tablet TAKE 1 TABLET 2 (TWO) TIMES DAILY AS NEEDED. TAKE FOR SYSTOLIC BLOOD PRESSURE MORE THAN 170. (Patient taking differently: Take 10 mg by mouth 2 (two) times daily as needed (SYSTOLIC BLOOD PRESSURE MORE THAN 170).) 180 tablet 3   metoprolol succinate (TOPROL XL) 25 MG 24 hr tablet  Take 1 tablet (25 mg total) by mouth daily. 90 tablet 3   olmesartan  (BENICAR ) 20 MG tablet Take 1 tablet (20 mg total) by mouth daily. 30 tablet 11   pantoprazole  (PROTONIX ) 20 MG tablet Take 20 mg by mouth daily.     potassium chloride  SA (KLOR-CON  M) 20 MEQ tablet Take 1 tablet (20 mEq total) by mouth daily. 90 tablet 0   No current facility-administered medications for this visit.    (Not in a hospital admission)   Family History  Problem Relation Age of Onset   Arthritis Mother    Heart disease Brother        Passed away   Cancer Neg Hx    Diabetes Neg Hx    Drug abuse Neg Hx    Hyperlipidemia Neg Hx    Hypertension Neg Hx    Kidney disease Neg Hx    Stroke Neg Hx      Review of Systems:   Review of Systems  Constitutional:  Positive for malaise/fatigue. Negative for chills and fever.  Respiratory:  Negative for cough and shortness of breath.   Cardiovascular:  Positive for palpitations. Negative for chest pain, orthopnea and leg swelling.  Gastrointestinal:  Negative for nausea and vomiting.  Neurological:  Negative for dizziness and headaches.      Physical Exam: BP 135/62 (BP Location: Right Arm, Patient Position: Sitting, Cuff Size: Normal)   Pulse 62   Resp 20   Ht 5' 7 (1.702 m)   Wt 132 lb (59.9 kg)   SpO2 100% Comment: RA  BMI 20.67 kg/m  Physical Exam Constitutional:      Appearance: Normal appearance.     Comments: Thin but well-appearing  Cardiovascular:     Rate and Rhythm: Normal rate.     Heart sounds: Murmur heard.  Pulmonary:     Effort: Pulmonary effort is normal.  Abdominal:     Palpations: Abdomen is soft.     Tenderness: There is no abdominal tenderness.  Musculoskeletal:        General: No swelling.  Skin:    General: Skin is warm and dry.  Neurological:     General: No focal deficit present.     Mental Status: He is alert.       Cardiac Studies & Procedures    ______________________________________________________________________________________________ CARDIAC CATHETERIZATION  CARDIAC CATHETERIZATION 08/05/2024  Conclusion   Prox LAD lesion is 80% stenosed.   Mid LAD lesion is 50% stenosed.   Mid Cx lesion is 80% stenosed.   Prox RCA lesion is 50% stenosed.   Mid RCA lesion is 50% stenosed.  1.  Severe calcific aortic stenosis with mean gradient 41 mmHg, aortic valve area 0.67 cm 2.  Severe  calcific 80% proximal LAD stenosis and diffuse 50% mid LAD stenosis 3.  Severe mid circumflex stenosis after the first OM 4.  Moderate RCA stenoses without high-grade disease 5.  Right heart catheterization data: RA mean 4 mmHg RV 43/7 mmHg PA 47/16 mean 25 mmHg Pulmonary wedge pressure A-wave 17, V wave 15, mean 13 mmHg LVEDP 8 mmHg Cardiac output/index 4.6/2.7  Recommendations: Multidisciplinary heart team reviewed.  Findings Coronary Findings Diagnostic  Dominance: Right  Left Anterior Descending The vessel is severely calcified. The LAD is densely calcified.  The proximal vessel has a 80% eccentric stenosis with heavy calcium .  The mid LAD has diffuse 50% stenosis. Prox LAD lesion is 80% stenosed. Mid LAD lesion is 50% stenosed.  Left Circumflex The large portions of the proximal circumflex and first OM are patent with minor nonobstructive disease.  The mid circumflex beyond the OM origin has an 80 to 90% stenosis that is focal.  It extends into posterolateral branch. Mid Cx lesion is 80% stenosed.  Right Coronary Artery There is moderate diffuse disease throughout the vessel. The RCA is diffusely diseased with no severe stenoses.  There is moderate diffuse plaquing of about 50% throughout the proximal and mid portions. Prox RCA lesion is 50% stenosed. Mid RCA lesion is 50% stenosed.  Intervention  No interventions have been documented.     ECHOCARDIOGRAM  ECHOCARDIOGRAM COMPLETE 07/11/2024  Narrative ECHOCARDIOGRAM  REPORT    Patient Name:   Guthrie Lemme Sr. Date of Exam: 07/11/2024 Medical Rec #:  [id number]                  Height:       67.0 in Accession #:    [id number]                 Weight:       124.1 lb Date of Birth:  January 20, 1956                   BSA:          1.651 m Patient Age:    68 years                   BP:           118/63 mmHg Patient Gender: M                          HR:           71 bpm. Exam Location:  Inpatient  Procedure: 2D Echo, 3D Echo, Cardiac Doppler, Color Doppler and Strain Analysis (Both Spectral and Color Flow Doppler were utilized during procedure).  Indications:    CHF  History:        Patient has prior history of Echocardiogram examinations, most recent 11/19/2023. TIA, Aortic Valve Disease; Risk Factors:Hypertension.  Sonographer:    Therisa Crouch Referring Phys: [id number] [facility]  IMPRESSIONS   1. LVEDV, 3D, 188 mL. Apical sparring pattern on strain polar map; in conjunction with aortic valve disease, can be seen in ATTR-CA. Left ventricular ejection fraction, by estimation, is 30 to 35%. Left ventricular ejection fraction by 3D volume is 41 %. The left ventricle has moderately decreased function. The left ventricle demonstrates global hypokinesis. The left ventricular internal cavity size was dilated. There is mild concentric left ventricular hypertrophy. Left ventricular diastolic parameters are indeterminate. The average left ventricular global longitudinal strain is -10.7 %. The global longitudinal strain is  abnormal. 2. Right ventricular systolic function is mildly reduced. The right ventricular size is normal. There is normal pulmonary artery systolic pressure. 3. Left atrial size was severely dilated. 4. Right atrial size was mildly dilated. 5. The mitral valve is grossly normal. Severe mitral valve regurgitation. No evidence of mitral stenosis. 6. There appear to be multiple jets vs eccentric flow; best seen in AC3 view. DVI 0.24. The  aortic valve is calcified. Aortic valve regurgitation is moderate. Severe aortic valve stenosis. Aortic valve area, by VTI measures 0.76 cm. Aortic valve mean gradient measures 33.0 mmHg. Aortic valve Vmax measures 3.87 m/s. 7. The inferior vena cava is normal in size with <50% respiratory variability, suggesting right atrial pressure of 8 mmHg.  Comparison(s): Prior images reviewed side by side. Internal progression of disease with worsening ventricular function and mitral regurgitation.  FINDINGS Left Ventricle: LVEDV, 3D, 188 mL. Apical sparring pattern on strain polar map; in conjunction with aortic valve disease, can be seen in ATTR-CA. Left ventricular ejection fraction, by estimation, is 30 to 35%. Left ventricular ejection fraction by 3D volume is 41 %. The left ventricle has moderately decreased function. The left ventricle demonstrates global hypokinesis. The average left ventricular global longitudinal strain is -10.7 %. Strain was performed and the global longitudinal strain is abnormal. The left ventricular internal cavity size was dilated. There is mild concentric left ventricular hypertrophy. Left ventricular diastolic parameters are indeterminate.  Right Ventricle: The right ventricular size is normal. No increase in right ventricular wall thickness. Right ventricular systolic function is mildly reduced. There is normal pulmonary artery systolic pressure. The tricuspid regurgitant velocity is 2.48 m/s, and with an assumed right atrial pressure of 8 mmHg, the estimated right ventricular systolic pressure is 32.6 mmHg.  Left Atrium: Left atrial size was severely dilated.  Right Atrium: Right atrial size was mildly dilated.  Pericardium: Trivial pericardial effusion is present. The pericardial effusion is posterior to the left ventricle.  Mitral Valve: The mitral valve is grossly normal. Severe mitral valve regurgitation. No evidence of mitral valve stenosis. MV peak gradient, 136.9  mmHg. The mean mitral valve gradient is 92.0 mmHg.  Tricuspid Valve: The tricuspid valve is normal in structure. Tricuspid valve regurgitation is not demonstrated. No evidence of tricuspid stenosis.  Aortic Valve: There appear to be multiple jets vs eccentric flow; best seen in AC3 view. DVI 0.24. The aortic valve is calcified. Aortic valve regurgitation is moderate. Aortic regurgitation PHT measures 311 msec. Severe aortic stenosis is present. Aortic valve mean gradient measures 33.0 mmHg. Aortic valve peak gradient measures 59.9 mmHg. Aortic valve area, by VTI measures 0.76 cm.  Pulmonic Valve: The pulmonic valve was normal in structure. Pulmonic valve regurgitation is trivial. No evidence of pulmonic stenosis.  Aorta: The aortic root and ascending aorta are structurally normal, with no evidence of dilitation.  Venous: The inferior vena cava is normal in size with less than 50% respiratory variability, suggesting right atrial pressure of 8 mmHg.  IAS/Shunts: No atrial level shunt detected by color flow Doppler.  Additional Comments: 3D was performed not requiring image post processing on an independent workstation and was abnormal.   LEFT VENTRICLE PLAX 2D LVIDd:         5.70 cm         Diastology LVIDs:         4.40 cm         LV e' medial:    4.74 cm/s LV PW:  1.00 cm         LV E/e' medial:  25.3 LV IVS:        1.00 cm         LV e' lateral:   6.25 cm/s LVOT diam:     2.00 cm         LV E/e' lateral: 19.2 LV SV:         57 LV SV Index:   35              2D Longitudinal LVOT Area:     3.14 cm        Strain 2D Strain GLS   -10.7 % Avg: LV Volumes (MOD) LV vol d, MOD    180.0 ml      3D Volume EF A2C:                           LV 3D EF:    Left LV vol d, MOD    172.0 ml                   ventricul A4C:                                        ar LV vol s, MOD    113.0 ml                   ejection A2C:                                        fraction LV vol s, MOD     102.0 ml                   by 3D A4C:                                        volume is LV SV MOD A2C:   67.0 ml                    41 %. LV SV MOD A4C:   172.0 ml      LV 3D EDV:   188.00 ml LV SV MOD BP:    70.2 ml       LV 3D ESV:   111.36 ml  3D Volume EF: 3D EF:        41 %  RIGHT VENTRICLE            IVC RV Basal diam:  3.30 cm    IVC diam: 1.90 cm RV S prime:     8.18 cm/s TAPSE (M-mode): 1.5 cm  LEFT ATRIUM             Index        RIGHT ATRIUM           Index LA diam:        3.70 cm 2.24 cm/m   RA Area:     18.40 cm LA Vol (A2C):   71.1 ml 43.06 ml/m  RA Volume:   53.00 ml  32.10 ml/m LA Vol (A4C):   84.9 ml 51.42 ml/m LA Biplane Vol: 80.9 ml  49.00 ml/m AORTIC VALVE AV Area (Vmax):    0.76 cm AV Area (Vmean):   0.79 cm AV Area (VTI):     0.76 cm AV Vmax:           387.00 cm/s AV Vmean:          256.333 cm/s AV VTI:            0.753 m AV Peak Grad:      59.9 mmHg AV Mean Grad:      33.0 mmHg LVOT Vmax:         93.90 cm/s LVOT Vmean:        64.700 cm/s LVOT VTI:          0.182 m LVOT/AV VTI ratio: 0.24 AI PHT:            311 msec  AORTA Ao Root diam: 3.60 cm Ao Asc diam:  3.30 cm  MITRAL VALVE                TRICUSPID VALVE MV Area (PHT): 4.40 cm     TV Peak grad:   24.6 mmHg MV Area VTI:   0.31 cm     TV Vmax:        2.48 m/s MV Peak grad:  136.9 mmHg   TR Peak grad:   24.6 mmHg MV Mean grad:  92.0 mmHg    TR Vmax:        248.00 cm/s MV Vmax:       5.85 m/s MV Vmean:      458.0 cm/s   SHUNTS MR PISA:        2.26 cm    Systemic VTI:  0.18 m MR PISA Radius: 0.60 cm     Systemic Diam: 2.00 cm MV E velocity: 120.00 cm/s MV A velocity: 48.40 cm/s MV E/A ratio:  2.48  Stanly Leavens MD Electronically signed by Stanly Leavens MD Signature Date/Time: 07/11/2024/12:52:36 PM    Final      CT SCANS  CT CORONARY MORPH W/CTA COR W/SCORE 08/09/2024  Addendum 08/16/2024  5:44 PM ADDENDUM REPORT: 08/16/2024 17:42  ADDENDUM: The  following report is an over-read performed by radiologist Dr. Dr. Reyes Holder of [facility] on 08/16/2024. This over-read does not include interpretation of cardiac or coronary anatomy or pathology. The coronary calcium  score/coronary CTA interpretation by the cardiologist is attached.  COMPARISON:  CT July 10, 2024  FINDINGS: Vascular: No acute non-cardiac vascular finding.  Mediastinum/Nodes: No pathologically enlarged mediastinal lymph nodes.  Lungs/Pleura: Mild emphysema. Left lower lobe pneumatocele. Scattered atelectasis/scarring.  Upper Abdomen: Calcified lesion with overlying scarring in the hepatic dome on image 45/307 stable from prior CT.  Musculoskeletal: Thoracic spondylosis.  IMPRESSION: Mild emphysema.  Emphysema (ICD10-J43.9).   Electronically Signed By: Reyes Holder M.D. On: 08/16/2024 17:42  Narrative CLINICAL DATA:  Severe Aortic Stenosis.  EXAM: Cardiac TAVR CT  TECHNIQUE: A non-contrast, gated CT scan was obtained with axial slices of 2.5 mm through the heart for aortic valve scoring. A 120 kV retrospective, gated, contrast cardiac scan was obtained. Gantry rotation speed was 230 msec and collimation was 0.63 mm. Nitroglycerin was not given. The 3D dataset was reconstructed in systole with motion correction. The 3D data set was reconstructed in 5% intervals of the 0-95% of the R-R cycle. Systolic and diastolic phases were analyzed on a dedicated workstation using MPR, MIP, and VRT modes. The patient received 100 cc of contrast.  FINDINGS: Image quality: Excellent.  Noise  artifact is: Limited.  Valve Morphology: Bicuspid aortic valve with fusion of the RCC/NCC. Severe diffuse calcifications. Severe bulky calcification of the NCC. Severely restricted leaflet motion in systole.  Aortic Valve Calcium  score: 2513  Aortic annular dimension:  Phase assessed: 30%  Annular area: 454 mm2  Annular perimeter: 76.8  mm  Max diameter: 27.2 mm  Min diameter: 22.2 mm  Annular and subannular calcification: Mild layered annular calcium  under the LCC.  Membranous septum length: 2.9 mm  Optimal coplanar projection: LAO 13 CAU 13  Coronary Artery Height above Annulus:  Left Main: 13.3 mm  Right Coronary: 13.7 mm  Sinus of Valsalva Measurements:  Non-coronary: 34 mm  Right-coronary: 33 mm  Left-coronary: 36 mm  Sinus of Valsalva Height:  Non-coronary: 26.1 mm  Right-coronary: 19.8 mm  Left-coronary: 23.8 mm  Sinotubular Junction: 29 mm  Ascending Thoracic Aorta: 33 mm  Coronary Arteries: Normal coronary origin. Right dominance. The study was performed without use of NTG and is insufficient for plaque evaluation. Please refer to recent cardiac catheterization for coronary assessment.  Cardiac Morphology:  Right Atrium: Right atrial size is within normal limits.  Right Ventricle: The right ventricular cavity is within normal limits.  Left Atrium: Left atrial size is normal in size with no left atrial appendage filling defect.  Left Ventricle: The ventricular cavity size is within normal limits.  Pulmonary arteries: Normal in size without proximal filling defect.  Pulmonary veins: Normal pulmonary venous drainage.  Pericardium: Normal thickness with no significant effusion or calcium  present.  Mitral Valve: The mitral valve is normal structure without significant calcification.  Extra-cardiac findings: See attached radiology report for non-cardiac structures.  IMPRESSION: 1. Bicuspid aortic valve with fusion of the RCC/NCC.  2. Annular measurements support a 26 mm S3 or 29 mm Evolut Pro.  3. Mild layered annular calcium  under the LCC.  4. Sufficient coronary to annulus distance.  5. Optimal Fluoroscopic Angle for Delivery: LAO 13 CAU 13  Diablock T. Barbaraann, MD  Electronically Signed: By: Darryle Barbaraann M.D. On: 08/09/2024 18:49      ______________________________________________________________________________________________      ECG NSR with LVH    I have independently reviewed the above radiologic studies and discussed with the patient   Recent Lab Findings: Lab Results  Component Value Date   WBC 4.0 08/03/2024   HGB 11.6 (L) 08/05/2024   HGB 11.6 (L) 08/05/2024   HCT 34.0 (L) 08/05/2024   HCT 34.0 (L) 08/05/2024   PLT 187 08/03/2024   GLUCOSE 89 09/06/2024   CHOL 177 12/09/2023   TRIG 47 12/09/2023   HDL 45 12/09/2023   LDLCALC 123 (H) 12/09/2023   ALT 92 (H) 08/03/2024   AST 78 (H) 08/03/2024   NA 143 09/06/2024   K 3.6 09/06/2024   CL 109 09/06/2024   CREATININE 0.95 09/06/2024   BUN 19 09/06/2024   CO2 25 09/06/2024   TSH 1.820 04/07/2024   INR 1.1 12/09/2023   HGBA1C 5.4 12/08/2023      Assessment / Plan:   68 y.o. male with a bicuspid valve and severe aortic stenosis.  He also has severe MR which will hopefully improve with TAVR.  He does have metastatic HCC but is still extremely functional, his cancer precludes him from SAVR at this time.  STS score: 1.7%.  NYHA Class II.  The risks and benefits of transcarotid TAVR were discussed in detail.  The risks included death, stroke, paravalvular leak, aortic dissection, annulus rupture, device embolization, acute myocardial  infarction, arrhythmia, heart block or need for permanent pacemaker.  We also discussed possibility of an emergent sternotomy to address any procedural complications.  Based on our discussion, we collectively decided that an emergent sternotomy would not be indicated.  The patient is agreeable to proceed.  Based on my review of hig LHC, echo, and CTA, I agree with the multidisciplinary plan to proceed with a left carotid 26 mm S3 TAVR.   I  spent 45 minutes counseling the patient face to face.   Con RAMAN Durante Violett 09/16/2024 10:32 AM

## 2024-09-17 ENCOUNTER — Other Ambulatory Visit: Payer: Self-pay

## 2024-09-17 ENCOUNTER — Ambulatory Visit (HOSPITAL_BASED_OUTPATIENT_CLINIC_OR_DEPARTMENT_OTHER): Admitting: Cardiovascular Disease

## 2024-09-17 DIAGNOSIS — I350 Nonrheumatic aortic (valve) stenosis: Secondary | ICD-10-CM

## 2024-09-22 ENCOUNTER — Encounter (HOSPITAL_BASED_OUTPATIENT_CLINIC_OR_DEPARTMENT_OTHER): Payer: Self-pay | Admitting: Cardiovascular Disease

## 2024-09-23 MED ORDER — ATORVASTATIN CALCIUM 40 MG PO TABS: 40.0000 mg | ORAL_TABLET | Freq: Every day | ORAL | 11 refills | Status: AC

## 2024-09-24 ENCOUNTER — Ambulatory Visit (HOSPITAL_COMMUNITY)
Admission: RE | Admit: 2024-09-24 | Discharge: 2024-09-24 | Disposition: A | Discharge status: Home / Self Care | Source: Ambulatory Visit | Attending: Cardiovascular Disease | Admitting: Cardiovascular Disease

## 2024-09-24 ENCOUNTER — Encounter (HOSPITAL_COMMUNITY)
Admission: RE | Admit: 2024-09-24 | Discharge: 2024-09-24 | Disposition: A | Discharge status: Home / Self Care | Source: Ambulatory Visit | Attending: Cardiovascular Disease | Admitting: Cardiovascular Disease

## 2024-09-24 ENCOUNTER — Other Ambulatory Visit: Payer: Self-pay

## 2024-09-24 DIAGNOSIS — I350 Nonrheumatic aortic (valve) stenosis: Secondary | ICD-10-CM | POA: Insufficient documentation

## 2024-09-24 DIAGNOSIS — Z01818 Encounter for other preprocedural examination: Secondary | ICD-10-CM | POA: Insufficient documentation

## 2024-09-24 LAB — COMPREHENSIVE METABOLIC PANEL WITH GFR
ALT: 107 U/L — ABNORMAL HIGH (ref 0–44)
AST: 116 U/L — ABNORMAL HIGH (ref 15–41)
Albumin: 3.7 g/dL (ref 3.5–5.0)
Alkaline Phosphatase: 232 U/L — ABNORMAL HIGH (ref 38–126)
Anion gap: 9 (ref 5–15)
BUN: 20 mg/dL (ref 8–23)
CO2: 23 mmol/L (ref 22–32)
Calcium: 9.6 mg/dL (ref 8.9–10.3)
Chloride: 110 mmol/L (ref 98–111)
Creatinine, Ser: 1.05 mg/dL (ref 0.61–1.24)
GFR, Estimated: >60 mL/min (ref >60)
Glucose, Bld: 64 mg/dL — ABNORMAL LOW (ref 70–99)
Potassium: 4.3 mmol/L (ref 3.5–5.1)
Sodium: 142 mmol/L (ref 135–145)
Total Bilirubin: 0.9 mg/dL (ref 0.0–1.2)
Total Protein: 7.3 g/dL (ref 6.5–8.1)

## 2024-09-24 LAB — URINALYSIS, ROUTINE W REFLEX MICROSCOPIC
Bacteria, UA: NONE SEEN
Bilirubin Urine: NEGATIVE
Glucose, UA: NEGATIVE mg/dL
Hgb urine dipstick: NEGATIVE
Ketones, ur: NEGATIVE mg/dL
Leukocytes,Ua: NEGATIVE
Nitrite: NEGATIVE
Protein, ur: 30 mg/dL — AB
Specific Gravity, Urine: 1.018 (ref 1.005–1.030)
pH: 5 (ref 5.0–8.0)

## 2024-09-24 LAB — PROTIME-INR
INR: 1 (ref 0.8–1.2)
Prothrombin Time: 14 s (ref 11.4–15.2)

## 2024-09-24 LAB — TYPE AND SCREEN
ABO/RH(D): O POS
Antibody Screen: NEGATIVE

## 2024-09-24 LAB — SURGICAL PCR SCREEN
MRSA, PCR: NEGATIVE
Staphylococcus aureus: NEGATIVE

## 2024-09-24 LAB — CBC
HCT: 35.5 % — ABNORMAL LOW (ref 39.0–52.0)
Hemoglobin: 11.5 g/dL — ABNORMAL LOW (ref 13.0–17.0)
MCH: 34.4 pg — ABNORMAL HIGH (ref 26.0–34.0)
MCHC: 32.4 g/dL (ref 30.0–36.0)
MCV: 106.3 fL — ABNORMAL HIGH (ref 80.0–100.0)
Platelets: 148 K/uL — ABNORMAL LOW (ref 150–400)
RBC: 3.34 MIL/uL — ABNORMAL LOW (ref 4.22–5.81)
RDW: 14.6 % (ref 11.5–15.5)
WBC: 3.3 K/uL — ABNORMAL LOW (ref 4.0–10.5)
nRBC: 0 % (ref 0.0–0.2)

## 2024-09-24 NOTE — Progress Notes (Signed)
 All consents signed by patient at PAT lab appointment. Pt was sent home with printed copy of surgical instructions and CHG soap/CHG soap instructions. All instructions reviewed with patient and questions answered.  Patients chart send to anesthesia for review. Pt denies any respiratory illness/infection in the last two months.

## 2024-09-27 ENCOUNTER — Other Ambulatory Visit: Payer: Self-pay | Admitting: Hematology

## 2024-09-27 MED ORDER — CEFAZOLIN SODIUM-DEXTROSE 2-4 GM/100ML-% IV SOLN
2.0000 g | INTRAVENOUS | Status: AC
  Administered 2024-09-28: 2 g via INTRAVENOUS
  Filled 2024-09-27: qty 100

## 2024-09-27 MED ORDER — DEXMEDETOMIDINE HCL IN NACL 400 MCG/100ML IV SOLN
0.1000 ug/kg/h | INTRAVENOUS | Status: AC
  Administered 2024-09-28: 29.96 ug via INTRAVENOUS
  Administered 2024-09-28: .7 ug/kg/h via INTRAVENOUS
  Filled 2024-09-27: qty 100

## 2024-09-27 MED ORDER — HEPARIN 30,000 UNITS/1000 ML (OHS) CELLSAVER SOLUTION
Status: DC
  Filled 2024-09-27 (×2): qty 1000

## 2024-09-27 MED ORDER — NOREPINEPHRINE 4 MG/250ML-% IV SOLN
0.0000 ug/min | INTRAVENOUS | Status: AC
  Administered 2024-09-28: 2 ug/min via INTRAVENOUS
  Filled 2024-09-27: qty 250

## 2024-09-27 MED ORDER — POTASSIUM CHLORIDE 2 MEQ/ML IV SOLN
80.0000 meq | INTRAVENOUS | Status: DC
  Filled 2024-09-27 (×2): qty 40

## 2024-09-27 MED ORDER — MAGNESIUM SULFATE 50 % IJ SOLN
40.0000 meq | INTRAMUSCULAR | Status: DC
  Filled 2024-09-27 (×2): qty 9.85

## 2024-09-28 ENCOUNTER — Encounter (HOSPITAL_COMMUNITY): Payer: Self-pay | Admitting: Cardiovascular Disease

## 2024-09-28 ENCOUNTER — Inpatient Hospital Stay (HOSPITAL_COMMUNITY)
Admission: RE | Admit: 2024-09-28 | Discharge: 2024-09-29 | DRG: 266 | Disposition: A | Discharge status: Home / Self Care | Attending: Cardiovascular Disease | Admitting: Cardiovascular Disease

## 2024-09-28 ENCOUNTER — Other Ambulatory Visit: Payer: Self-pay

## 2024-09-28 ENCOUNTER — Inpatient Hospital Stay (HOSPITAL_COMMUNITY)

## 2024-09-28 ENCOUNTER — Encounter (HOSPITAL_COMMUNITY): Admission: RE | Disposition: A | Discharge status: Home / Self Care | Payer: Self-pay | Source: Home / Self Care

## 2024-09-28 ENCOUNTER — Inpatient Hospital Stay (HOSPITAL_COMMUNITY): Admitting: Physician Assistant

## 2024-09-28 DIAGNOSIS — I350 Nonrheumatic aortic (valve) stenosis: Secondary | ICD-10-CM

## 2024-09-28 DIAGNOSIS — D630 Anemia in neoplastic disease: Secondary | ICD-10-CM | POA: Diagnosis present

## 2024-09-28 DIAGNOSIS — Z7969 Long term (current) use of other immunomodulators and immunosuppressants: Secondary | ICD-10-CM | POA: Diagnosis not present

## 2024-09-28 DIAGNOSIS — Z923 Personal history of irradiation: Secondary | ICD-10-CM

## 2024-09-28 DIAGNOSIS — C220 Liver cell carcinoma: Secondary | ICD-10-CM | POA: Diagnosis present

## 2024-09-28 DIAGNOSIS — I5023 Acute on chronic systolic (congestive) heart failure: Secondary | ICD-10-CM | POA: Diagnosis present

## 2024-09-28 DIAGNOSIS — I2510 Atherosclerotic heart disease of native coronary artery without angina pectoris: Secondary | ICD-10-CM | POA: Diagnosis present

## 2024-09-28 DIAGNOSIS — K219 Gastro-esophageal reflux disease without esophagitis: Secondary | ICD-10-CM | POA: Diagnosis present

## 2024-09-28 DIAGNOSIS — Z7982 Long term (current) use of aspirin: Secondary | ICD-10-CM

## 2024-09-28 DIAGNOSIS — I509 Heart failure, unspecified: Secondary | ICD-10-CM

## 2024-09-28 DIAGNOSIS — Z79899 Other long term (current) drug therapy: Secondary | ICD-10-CM

## 2024-09-28 DIAGNOSIS — Z8249 Family history of ischemic heart disease and other diseases of the circulatory system: Secondary | ICD-10-CM | POA: Diagnosis not present

## 2024-09-28 DIAGNOSIS — Z952 Presence of prosthetic heart valve: Secondary | ICD-10-CM | POA: Diagnosis not present

## 2024-09-28 DIAGNOSIS — G894 Chronic pain syndrome: Secondary | ICD-10-CM | POA: Diagnosis present

## 2024-09-28 DIAGNOSIS — C7951 Secondary malignant neoplasm of bone: Secondary | ICD-10-CM | POA: Diagnosis present

## 2024-09-28 DIAGNOSIS — I110 Hypertensive heart disease with heart failure: Secondary | ICD-10-CM | POA: Diagnosis present

## 2024-09-28 DIAGNOSIS — Z006 Encounter for examination for normal comparison and control in clinical research program: Secondary | ICD-10-CM

## 2024-09-28 DIAGNOSIS — Z8261 Family history of arthritis: Secondary | ICD-10-CM

## 2024-09-28 DIAGNOSIS — I340 Nonrheumatic mitral (valve) insufficiency: Secondary | ICD-10-CM | POA: Diagnosis present

## 2024-09-28 DIAGNOSIS — Z87891 Personal history of nicotine dependence: Secondary | ICD-10-CM | POA: Diagnosis not present

## 2024-09-28 DIAGNOSIS — I70203 Unspecified atherosclerosis of native arteries of extremities, bilateral legs: Secondary | ICD-10-CM | POA: Diagnosis present

## 2024-09-28 DIAGNOSIS — D638 Anemia in other chronic diseases classified elsewhere: Secondary | ICD-10-CM | POA: Diagnosis present

## 2024-09-28 DIAGNOSIS — I10 Essential (primary) hypertension: Secondary | ICD-10-CM | POA: Diagnosis present

## 2024-09-28 DIAGNOSIS — Z8673 Personal history of transient ischemic attack (TIA), and cerebral infarction without residual deficits: Secondary | ICD-10-CM | POA: Diagnosis not present

## 2024-09-28 HISTORY — DX: Nonrheumatic aortic (valve) stenosis: I35.0

## 2024-09-28 LAB — ECHO TEE
AR max vel: 3.9 cm2
AV Area VTI: 4.07 cm2
AV Area mean vel: 3.96 cm2
AV Mean grad: 4 mmHg
AV Peak grad: 8.2 mmHg
Ao pk vel: 1.43 m/s

## 2024-09-28 SURGERY — TRANSCATHETER AORTIC VALVE REPLACEMENT,CAROTID (CATHLAB)
Anesthesia: General

## 2024-09-28 MED ORDER — FENTANYL CITRATE (PF) 100 MCG/2ML IJ SOLN
INTRAMUSCULAR | Status: AC
  Filled 2024-09-28: qty 2

## 2024-09-28 MED ORDER — NITROGLYCERIN IN D5W 200-5 MCG/ML-% IV SOLN: 0.0000 ug/min | INTRAVENOUS | Status: DC

## 2024-09-28 MED ORDER — TRAMADOL HCL 50 MG PO TABS: 50.0000 mg | ORAL_TABLET | ORAL | Status: DC | PRN

## 2024-09-28 MED ORDER — CHLORHEXIDINE GLUCONATE 4 % EX SOLN: 60.0000 mL | Freq: Once | CUTANEOUS | Status: DC

## 2024-09-28 MED ORDER — HYDRALAZINE HCL 20 MG/ML IJ SOLN
INTRAMUSCULAR | Status: AC
  Filled 2024-09-28: qty 1

## 2024-09-28 MED ORDER — VASOPRESSIN 20 UNITS/100 ML INFUSION FOR SHOCK
INTRAVENOUS | Status: AC
Start: 2024-09-28 — End: 2024-09-28
  Filled 2024-09-28: qty 100

## 2024-09-28 MED ORDER — DEXAMETHASONE SOD PHOSPHATE PF 10 MG/ML IJ SOLN: INTRAMUSCULAR | Status: DC | PRN

## 2024-09-28 MED ORDER — PHENYLEPHRINE HCL-NACL 20-0.9 MG/250ML-% IV SOLN
INTRAVENOUS | Status: DC | PRN
Start: 2024-09-28 — End: 2024-09-28
  Administered 2024-09-28: 15 ug/min via INTRAVENOUS
  Administered 2024-09-28 (×3): 40 ug via INTRAVENOUS
  Administered 2024-09-28: 80 ug via INTRAVENOUS
  Administered 2024-09-28: 40 ug via INTRAVENOUS

## 2024-09-28 MED ORDER — MORPHINE SULFATE (PF) 2 MG/ML IV SOLN: 1.0000 mg | INTRAVENOUS | Status: DC | PRN

## 2024-09-28 MED ORDER — POTASSIUM CHLORIDE CRYS ER 20 MEQ PO TBCR
30.0000 meq | EXTENDED_RELEASE_TABLET | Freq: Once | ORAL | Status: AC
  Administered 2024-09-28: 30 meq via ORAL
  Filled 2024-09-28: qty 1

## 2024-09-28 MED ORDER — AMISULPRIDE (ANTIEMETIC) 5 MG/2ML IV SOLN: 10.0000 mg | Freq: Once | INTRAVENOUS | Status: DC | PRN

## 2024-09-28 MED ORDER — CHLORHEXIDINE GLUCONATE 0.12 % MT SOLN
15.0000 mL | Freq: Once | OROMUCOSAL | Status: AC
  Administered 2024-09-28: 15 mL via OROMUCOSAL
  Filled 2024-09-28: qty 15

## 2024-09-28 MED ORDER — NOREPINEPHRINE BITARTRATE 1 MG/ML IV SOLN
INTRAVENOUS | Status: DC | PRN
  Administered 2024-09-28 (×2): .5 mL via INTRAVENOUS
  Administered 2024-09-28: .25 mL via INTRAVENOUS
  Administered 2024-09-28 (×3): .5 mL via INTRAVENOUS
  Administered 2024-09-28: 1 mL via INTRAVENOUS

## 2024-09-28 MED ORDER — ATORVASTATIN CALCIUM 40 MG PO TABS
40.0000 mg | ORAL_TABLET | Freq: Every day | ORAL | Status: DC
  Administered 2024-09-28 – 2024-09-29 (×2): 40 mg via ORAL
  Filled 2024-09-28 (×2): qty 1

## 2024-09-28 MED ORDER — HYDRALAZINE HCL 20 MG/ML IJ SOLN
10.0000 mg | Freq: Four times a day (QID) | INTRAMUSCULAR | Status: DC | PRN
  Administered 2024-09-28: 10 mg via INTRAVENOUS

## 2024-09-28 MED ORDER — SODIUM CHLORIDE 0.9% FLUSH: 3.0000 mL | INTRAVENOUS | Status: DC | PRN

## 2024-09-28 MED ORDER — SUGAMMADEX SODIUM 200 MG/2ML IV SOLN
INTRAVENOUS | Status: DC | PRN
  Administered 2024-09-28: 119.8 mg via INTRAVENOUS

## 2024-09-28 MED ORDER — ACETAMINOPHEN 325 MG PO TABS: 650.0000 mg | ORAL_TABLET | Freq: Four times a day (QID) | ORAL | Status: DC | PRN

## 2024-09-28 MED ORDER — SODIUM CHLORIDE 0.9% FLUSH
3.0000 mL | Freq: Two times a day (BID) | INTRAVENOUS | Status: DC
  Administered 2024-09-28 – 2024-09-29 (×2): 3 mL via INTRAVENOUS

## 2024-09-28 MED ORDER — CHLORHEXIDINE GLUCONATE 4 % EX SOLN: 30.0000 mL | CUTANEOUS | Status: DC

## 2024-09-28 MED ORDER — LACTATED RINGERS IV SOLN: INTRAVENOUS | Status: DC | PRN

## 2024-09-28 MED ORDER — HEPARIN SODIUM (PORCINE) 1000 UNIT/ML IJ SOLN
INTRAMUSCULAR | Status: DC | PRN
  Administered 2024-09-28: 9000 [IU] via INTRAVENOUS

## 2024-09-28 MED ORDER — ONDANSETRON HCL 4 MG/2ML IJ SOLN: 4.0000 mg | Freq: Four times a day (QID) | INTRAMUSCULAR | Status: DC | PRN

## 2024-09-28 MED ORDER — FUROSEMIDE 10 MG/ML IJ SOLN
60.0000 mg | Freq: Once | INTRAMUSCULAR | Status: AC
  Administered 2024-09-28: 60 mg via INTRAVENOUS
  Filled 2024-09-28: qty 6

## 2024-09-28 MED ORDER — ROCURONIUM BROMIDE 10 MG/ML (PF) SYRINGE
PREFILLED_SYRINGE | INTRAVENOUS | Status: DC | PRN
  Administered 2024-09-28: 10 mg via INTRAVENOUS
  Administered 2024-09-28: 40 mg via INTRAVENOUS
  Administered 2024-09-28: 20 mg via INTRAVENOUS

## 2024-09-28 MED ORDER — OXYCODONE HCL 5 MG PO TABS: 5.0000 mg | ORAL_TABLET | ORAL | Status: DC | PRN

## 2024-09-28 MED ORDER — VASOPRESSIN 20 UNIT/ML IV SOLN
INTRAVENOUS | Status: AC
Start: 2024-09-28 — End: 2024-09-28
  Filled 2024-09-28: qty 1

## 2024-09-28 MED ORDER — PROPOFOL 10 MG/ML IV BOLUS
INTRAVENOUS | Status: DC | PRN
  Administered 2024-09-28: 40 mg via INTRAVENOUS
  Administered 2024-09-28 (×2): 20 mg via INTRAVENOUS
  Administered 2024-09-28: 50 mg via INTRAVENOUS
  Administered 2024-09-28: 20 mg via INTRAVENOUS
  Administered 2024-09-28: 25 ug/kg/min via INTRAVENOUS

## 2024-09-28 MED ORDER — SODIUM CHLORIDE 0.9 % IV SOLN: INTRAVENOUS | Status: DC

## 2024-09-28 MED ORDER — ONDANSETRON HCL 4 MG/2ML IJ SOLN: 4.0000 mg | Freq: Once | INTRAMUSCULAR | Status: DC | PRN

## 2024-09-28 MED ORDER — NOREPINEPHRINE 4 MG/250ML-% IV SOLN
0.0000 ug/min | INTRAVENOUS | Status: DC
  Filled 2024-09-28: qty 250

## 2024-09-28 MED ORDER — PROTAMINE SULFATE 10 MG/ML IV SOLN
INTRAVENOUS | Status: DC | PRN
  Administered 2024-09-28: 90 mg via INTRAVENOUS

## 2024-09-28 MED ORDER — ONDANSETRON HCL 4 MG/2ML IJ SOLN
INTRAMUSCULAR | Status: DC | PRN
Start: 2024-09-28 — End: 2024-09-28
  Administered 2024-09-28: 4 mg via INTRAVENOUS

## 2024-09-28 MED ORDER — FENTANYL CITRATE (PF) 100 MCG/2ML IJ SOLN
INTRAMUSCULAR | Status: DC | PRN
  Administered 2024-09-28 (×2): 50 ug via INTRAVENOUS

## 2024-09-28 MED ORDER — HEPARIN (PORCINE) IN NACL 2000-0.9 UNIT/L-% IV SOLN
INTRAVENOUS | Status: DC | PRN
  Administered 2024-09-28: 1000 mL

## 2024-09-28 MED ORDER — SODIUM CHLORIDE 0.9 % IV SOLN: INTRAVENOUS | Status: AC

## 2024-09-28 MED ORDER — ASPIRIN 81 MG PO TBEC
81.0000 mg | DELAYED_RELEASE_TABLET | Freq: Every day | ORAL | Status: DC
  Administered 2024-09-28 – 2024-09-29 (×2): 81 mg via ORAL
  Filled 2024-09-28 (×2): qty 1

## 2024-09-28 MED ORDER — IODIXANOL 320 MG/ML IV SOLN
INTRAVENOUS | Status: DC | PRN
  Administered 2024-09-28: 30 mL

## 2024-09-28 MED ORDER — CLEVIDIPINE BUTYRATE 0.5 MG/ML IV EMUL
INTRAVENOUS | Status: DC | PRN
  Administered 2024-09-28: 2 mg/h via INTRAVENOUS

## 2024-09-28 MED ORDER — ACETAMINOPHEN 650 MG RE SUPP: 650.0000 mg | Freq: Four times a day (QID) | RECTAL | Status: DC | PRN

## 2024-09-28 MED ORDER — SODIUM CHLORIDE 0.9 % IV SOLN: 250.0000 mL | INTRAVENOUS | Status: DC | PRN

## 2024-09-28 MED ORDER — CEFAZOLIN SODIUM-DEXTROSE 2-4 GM/100ML-% IV SOLN
2.0000 g | Freq: Three times a day (TID) | INTRAVENOUS | Status: AC
  Administered 2024-09-28 (×2): 2 g via INTRAVENOUS
  Filled 2024-09-28 (×2): qty 100

## 2024-09-28 SURGICAL SUPPLY — 25 items
CABLE ADAPT PACING TEMP 12FT (ADAPTER) IMPLANT
CATH 26 ULTRA DELIVERY (CATHETERS) IMPLANT
CATH DIAG 6FR JR4 (CATHETERS) IMPLANT
CATH DIAG 6FR PIGTAIL ANGLED (CATHETERS) IMPLANT
CATH INFINITI 5FR ANG PIGTAIL (CATHETERS) IMPLANT
CATH INFINITI 6F AL2 (CATHETERS) IMPLANT
CLOSURE MYNX CONTROL 6F/7F (Vascular Products) IMPLANT
CLOSURE PERCLOSE PROSTYLE (Vascular Products) IMPLANT
CRIMPER (MISCELLANEOUS) IMPLANT
DEVICE INFLATION ATRION QL2530 (MISCELLANEOUS) IMPLANT
KIT MICROPUNCTURE NIT STIFF (SHEATH) IMPLANT
KIT SAPIAN 3 ULTRA RESILIA 26 (Valve) IMPLANT
KIT SYRINGE INJ CVI SPIKEX1 (MISCELLANEOUS) IMPLANT
SET ATX-X65L (MISCELLANEOUS) IMPLANT
SHEATH BRITE TIP 7FR 35CM (SHEATH) IMPLANT
SHEATH INTRODUCER SET 20-26 (SHEATH) IMPLANT
SHEATH PINNACLE 6F 10CM (SHEATH) IMPLANT
SHEATH PINNACLE 8F 10CM (SHEATH) IMPLANT
SHIELD CATH-GARD CONTAMINATION (MISCELLANEOUS) IMPLANT
STOPCOCK MORSE 400PSI 3WAY (MISCELLANEOUS) ×4 IMPLANT
WIRE EMERALD 3MM-J .035X150CM (WIRE) IMPLANT
WIRE EMERALD 3MM-J .035X260CM (WIRE) IMPLANT
WIRE EMERALD ST .035X260CM (WIRE) IMPLANT
WIRE PACING TEMP ST TIP 5 (CATHETERS) IMPLANT
WIRE SAFARI SM CURVE 275 (WIRE) IMPLANT

## 2024-09-28 NOTE — Discharge Summary (Incomplete)
 HEART AND VASCULAR CENTER   MULTIDISCIPLINARY HEART VALVE TEAM  Discharge Summary    Patient ID: Corey Raczkowski Sr. MRN: [id number]; DOB: 1956-02-12  Admit date: 09/28/2024 Discharge date: 09/29/2024  PCP:  [name], MD  [facility] Cardiologist:  Annabella Scarce, MD  [facility] Structural heart: Lonni Cash, MD [facility] Electrophysiologist:  None   Discharge Diagnoses    Principal Problem:   S/P TAVR (transcatheter aortic valve replacement) Active Problems:   Hepatocellular carcinoma in adult [facility])   Essential hypertension   Metastatic cancer to bone [facility])   Chronic pain syndrome   Anemia of chronic disease   Severe aortic stenosis   Acute CHF (congestive heart failure) (HCC)   Allergies No Known Allergies  Diagnostic Studies/Procedures    HEART AND VASCULAR CENTER  TAVR OPERATIVE NOTE     Date of Procedure:                09/28/2024   Preoperative Diagnosis:      Severe Aortic Stenosis    Postoperative Diagnosis:    Same    Procedure:        Transcatheter Aortic Valve Replacement - TransCarotid Approach             Edwards Sapien 3 THV (size 26 mm, model # [id number], serial # [id number] )              Co-Surgeons:                        Lonni Cash, MD and Con Clunes , MD    Anesthesiologist:                  [place]   Echocardiographer:              Delford   Pre-operative Echo Findings: Severe aortic stenosis Moderate left ventricular systolic dysfunction/Small pericardial effusion   Post-operative Echo Findings: No paravalvular leak Moderate left ventricular systolic dysfunction/small pericardial effusion  _____________    Echo 09/29/24: completed but pending formal read at the time of discharge   History of Present Illness     Corey Higbie Sr. is a 68 y.o. 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   Admitted 8/30-07/12/24 for acute HFrEF. Echo 07/11/24 showed EF 30%, mild LVH, mild RV dysfunction, severe MR, severe AS w/ mean grad 33 mmHg, Vmax 3.87 m/s, AVA 0.76 cm2, DVI 0.24, SVI 35, and moderate AI. [facility] 08/05/24 showed severe calcific 80% proximal LAD stenosis and diffuse 50% mid LAD stenosis, severe mid circumflex stenosis after the first OM and moderate RCA stenoses without high-grade disease. RHC: RA mean 4 mmHg. RV 43/7 mmHg. PA 47/16 mean 25 mmHg. Pulmonary wedge pressure A-wave 17, V wave 15, mean 13 mmHg. LVEDP 8 mmHg. CO/CI 4.6/2.7.  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 He has undergone surgery, XRT and chemo and is currently on Cabozentinib which was held in anticipation of TAVR. He was felt to have >12 months to live and remains very active working at the [employer] facility.   The patient was evaluated by the multidisciplinary valve team and felt to have severe, symptomatic aortic stenosis and to be a suitable candidate for TAVR, which was set up for 09/28/24.   Hospital Course  Consultants: none   Severe AS:  -- S/p successful TAVR with a 26 mm Edwards Sapien 3 Ultra Resilia THV via the left carotid approach on 09/28/24.  -- Post operative echo completed but pending formal read. -- Groin/carotid sites are stable.  -- ECG with sinus and no high grade heart block. -- Continue Asprin 81mg  daily.  -- Met with cardiac rehab to discuss CRP phase II.  -- Plan for discharge home today with close follow up in the outpatient setting.   Acute on chronic HFrEF: -- EF 30% previously, but EF looks normalized on POD1 echo. Will await formal read.  -- LVEDP 35 mm hg at the time of TAVR.  -- Treated with IV Lasix  and KCL supplementation.  -- Resume home Lasix  40mg  daily and KCL 20meq daily.  HTN: -- BP well controlled.  -- Resume home Toprol XL 25mg  daily and olmesartan  20mg  daily. -- Continue hydralazine   10mg  BID as needed.   CAD: -- [facility] 08/05/24 showed severe calcific 80% proximal LAD stenosis and diffuse 50% mid LAD stenosis, severe mid circumflex stenosis after the first OM and moderate RCA stenoses without high-grade disease.  -- Continue to treat medically.  -- Continue aspirin  81mg  daily and atorvastatin  40mg  daily.   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 Lanny.  -- Follow up 10/11/24 to discuss when to restart Cabozentinib.  _____________  Discharge Vitals Blood pressure 118/68, pulse 90, temperature 98.6 F (37 C), temperature source Oral, resp. rate 19, height 5' 7 (1.702 m), weight 57.9 kg, SpO2 99%.  Filed Weights   09/28/24 0550 09/29/24 0441  Weight: 59.9 kg 57.9 kg    GEN: Well nourished, well developed in no acute distress NECK: No JVD CARDIAC: RRR, no murmurs, rubs, gallops RESPIRATORY:  Clear to auscultation without rales, wheezing or rhonchi  ABDOMEN: Soft, non-tender, non-distended EXTREMITIES:  No edema; No deformity.  Groin/carotid sites clear without hematoma or ecchymosis.   Disposition   Pt is being discharged home today in good condition.  Follow-up Plans & Appointments     Follow-up Information     Sebastian Lamarr SAUNDERS, PA-C. Go on 10/04/2024.   Specialties: Cardiology, Radiology Why: @ noon, please arrive at least 20 minutes early. Contact information: [address] [place] [address] [phone]                  Discharge Medications   Allergies as of 09/29/2024   No Known Allergies      Medication List     PAUSE taking these medications    Cabometyx  60 MG tablet Wait to take this until your doctor or other care provider tells you to start again. Generic drug: cabozantinib TAKE 1 TABLET BY MOUTH 1 TIME A DAY ON AN EMPTY STOMACH, 1 HOUR BEFORE OR 2 HOURS AFTER MEALS.       TAKE these medications    acetaminophen  500 MG tablet Commonly known as: TYLENOL  Take 1,000 mg by  mouth every 6 (six) hours as needed (pain.).   aspirin  EC 81 MG tablet Take 1 tablet (81 mg total) by mouth daily. Swallow whole.   atorvastatin  40 MG tablet Commonly known as: Lipitor Take 1 tablet (40 mg total) by mouth daily.   furosemide  40 MG tablet Commonly known as: Lasix  Take 1 tablet (40 mg total) by mouth daily.   hydrALAZINE  10 MG tablet Commonly known as: APRESOLINE  TAKE 1 TABLET 2 (TWO) TIMES DAILY AS NEEDED. TAKE FOR SYSTOLIC BLOOD PRESSURE MORE THAN  170.   metoprolol succinate 25 MG 24 hr tablet Commonly known as: Toprol XL Take 1 tablet (25 mg total) by mouth daily.   olmesartan  20 MG tablet Commonly known as: BENICAR  Take 1 tablet (20 mg total) by mouth daily.   pantoprazole  20 MG tablet Commonly known as: PROTONIX  Take 20 mg by mouth daily.   potassium chloride  SA 20 MEQ tablet Commonly known as: KLOR-CON  M Take 1 tablet (20 mEq total) by mouth daily.          Outstanding Labs/Studies   none  ______________________  Duration of Discharge Encounter: APP Time: 35 minutes    Signed, Lamarr Hummer, PA-C 09/29/2024, 10:30 AM [phone]

## 2024-09-28 NOTE — Anesthesia Procedure Notes (Signed)
 Procedure Name: Intubation Date/Time: 09/28/2024 7:58 AM  Performed by: Jerl Donald LABOR, CRNAPre-anesthesia Checklist: Patient identified, Emergency Drugs available, Suction available and Patient being monitored Patient Re-evaluated:Patient Re-evaluated prior to induction Oxygen Delivery Method: Circle System Utilized Preoxygenation: Pre-oxygenation with 100% oxygen Induction Type: IV induction Ventilation: Mask ventilation without difficulty Laryngoscope Size: Mac and 3 Grade View: Grade I Tube type: Oral Tube size: 7.0 mm Number of attempts: 1 Airway Equipment and Method: Stylet Placement Confirmation: ETT inserted through vocal cords under direct vision, positive ETCO2 and breath sounds checked- equal and bilateral Secured at: 22 cm Tube secured with: Tape Dental Injury: Teeth and Oropharynx as per pre-operative assessment

## 2024-09-28 NOTE — Progress Notes (Signed)
  Echocardiogram Echocardiogram Transesophageal has been performed.  Corey Armstrong 09/28/2024, 9:53 AM

## 2024-09-28 NOTE — Anesthesia Preprocedure Evaluation (Addendum)
 Anesthesia Evaluation  Patient identified by MRN, date of birth, ID band Patient awake    Reviewed: Allergy & Precautions, NPO status , Patient's Chart, lab work & pertinent test results  Airway Mallampati: I  TM Distance: >3 FB Neck ROM: Full    Dental  (+) Edentulous Upper, Edentulous Lower   Pulmonary former smoker   breath sounds clear to auscultation       Cardiovascular hypertension, Pt. on medications and Pt. on home beta blockers +CHF  + Valvular Problems/Murmurs AS  Rhythm:Regular Rate:Normal  Echo:  1. LVEDV, 3D, 188 mL. Apical sparring pattern on strain polar map; in  conjunction with aortic valve disease, can be seen in ATTR-CA. Left  ventricular ejection fraction, by estimation, is 30 to 35%. Left  ventricular ejection fraction by 3D volume is 41  %. The left ventricle has moderately decreased function. The left  ventricle demonstrates global hypokinesis. The left ventricular internal  cavity size was dilated. There is mild concentric left ventricular  hypertrophy. Left ventricular diastolic  parameters are indeterminate. The average left ventricular global  longitudinal strain is -10.7 %. The global longitudinal strain is  abnormal.   2. Right ventricular systolic function is mildly reduced. The right  ventricular size is normal. There is normal pulmonary artery systolic  pressure.   3. Left atrial size was severely dilated.   4. Right atrial size was mildly dilated.   5. The mitral valve is grossly normal. Severe mitral valve regurgitation.  No evidence of mitral stenosis.   6. There appear to be multiple jets vs eccentric flow; best seen in AC3  view. DVI 0.24. The aortic valve is calcified. Aortic valve regurgitation  is moderate. Severe aortic valve stenosis. Aortic valve area, by VTI  measures 0.76 cm. Aortic valve mean  gradient measures 33.0 mmHg. Aortic valve Vmax measures 3.87 m/s.   7. The inferior  vena cava is normal in size with <50% respiratory  variability, suggesting right atrial pressure of 8 mmHg.     Neuro/Psych  Headaches TIA negative psych ROS   GI/Hepatic ,GERD  Medicated,,(+) Hepatitis -  Endo/Other  negative endocrine ROS    Renal/GU negative Renal ROS     Musculoskeletal negative musculoskeletal ROS (+)    Abdominal   Peds  Hematology  (+) Blood dyscrasia, anemia   Anesthesia Other Findings   Reproductive/Obstetrics                              Anesthesia Physical Anesthesia Plan  ASA: 4  Anesthesia Plan: General   Post-op Pain Management: Ofirmev  IV (intra-op)*   Induction: Intravenous  PONV Risk Score and Plan: 3 and Ondansetron , Treatment may vary due to age or medical condition, Midazolam  and Dexamethasone   Airway Management Planned: Oral ETT  Additional Equipment: Arterial line  Intra-op Plan:   Post-operative Plan: Extubation in OR  Informed Consent: I have reviewed the patients History and Physical, chart, labs and discussed the procedure including the risks, benefits and alternatives for the proposed anesthesia with the patient or authorized representative who has indicated his/her understanding and acceptance.     Dental advisory given  Plan Discussed with: CRNA  Anesthesia Plan Comments:          Anesthesia Quick Evaluation

## 2024-09-28 NOTE — Plan of Care (Signed)
  Problem: Clinical Measurements: Goal: Will remain free from infection Outcome: Progressing   Problem: Nutrition: Goal: Adequate nutrition will be maintained Outcome: Progressing   Problem: Elimination: Goal: Will not experience complications related to bowel motility Outcome: Progressing Goal: Will not experience complications related to urinary retention Outcome: Progressing   Problem: Safety: Goal: Ability to remain free from injury will improve Outcome: Progressing

## 2024-09-28 NOTE — Progress Notes (Signed)
 Patient arrived in the unit, V/S obtained, CCMD notified, Chg bath given, vascular sites are C/D/I, all needs met, call bell in reach, will continue to monitor.   09/28/24 1144  Vitals  Temp (!) 97.4 F (36.3 C)  Temp Source Oral  BP (!) 144/99  MAP (mmHg) 107  BP Location Right Arm  BP Method Automatic  Patient Position (if appropriate) Lying  Pulse Rate 66  Pulse Rate Source Monitor  ECG Heart Rate 67  Resp 18  Level of Consciousness  Level of Consciousness Alert  MEWS COLOR  MEWS Score Color Green  Oxygen Therapy  SpO2 100 %  O2 Device Room Air  Pain Assessment  Pain Scale 0-10  Pain Score 0  MEWS Score  MEWS Temp 0  MEWS Systolic 0  MEWS Pulse 0  MEWS RR 0  MEWS LOC 0  MEWS Score 0

## 2024-09-28 NOTE — CV Procedure (Signed)
 HEART AND VASCULAR CENTER  TAVR OPERATIVE NOTE   Date of Procedure:  09/28/2024  Preoperative Diagnosis: Severe Aortic Stenosis   Postoperative Diagnosis: Same   Procedure:   Transcatheter Aortic Valve Replacement - TransCarotid Approach  Edwards Sapien 3 THV (size 26 mm, model # [id number], serial # [id number] )   Co-Surgeons:  Lonni Cash, MD and Con Clunes , MD   Anesthesiologist:  [place]  Echocardiographer:  Delford  Pre-operative Echo Findings: Severe aortic stenosis Moderate left ventricular systolic dysfunction/Small pericardial effusion  Post-operative Echo Findings: No paravalvular leak Moderate left ventricular systolic dysfunction/small pericardial effusion  BRIEF CLINICAL NOTE AND INDICATIONS FOR SURGERY  68 yo male with severe AS, severe MR, cardiomyopathy, CAD, HCC with mets. He is here today for TAVR   During the course of the patient's preoperative work up they have been evaluated comprehensively by a multidisciplinary team of specialists coordinated through the Multidisciplinary Heart Valve Clinic in the [facility].  They have been demonstrated to suffer from symptomatic severe aortic stenosis as noted above. The patient has been counseled extensively as to the relative risks and benefits of all options for the treatment of severe aortic stenosis including long term medical therapy, conventional surgery for aortic valve replacement, and transcatheter aortic valve replacement.  The patient has been independently evaluated by Dr. Clunes with CT surgery and they are felt to be at high risk for conventional surgical aortic valve replacement. The surgeon indicated the patient would be a poor candidate for conventional surgery. Based upon review of all of the patient's preoperative diagnostic tests they are felt to be candidate for transcatheter aortic valve replacement using the transfemoral approach as an alternative to high risk conventional  surgery.    Following the decision to proceed with transcatheter aortic valve replacement, a discussion has been held regarding what types of management strategies would be attempted intraoperatively in the event of life-threatening complications, including whether or not the patient would be considered a candidate for the use of cardiopulmonary bypass and/or conversion to open sternotomy for attempted surgical intervention.  The patient has been advised of a variety of complications that might develop peculiar to this approach including but not limited to risks of death, stroke, paravalvular leak, aortic dissection or other major vascular complications, aortic annulus rupture, device embolization, cardiac rupture or perforation, acute myocardial infarction, arrhythmia, heart block or bradycardia requiring permanent pacemaker placement, congestive heart failure, respiratory failure, renal failure, pneumonia, infection, other late complications related to structural valve deterioration or migration, or other complications that might ultimately cause a temporary or permanent loss of functional independence or other long term morbidity.  The patient provides full informed consent for the procedure as described and all questions were answered preoperatively.    DETAILS OF THE OPERATIVE PROCEDURE  PREPARATION:   The patient is brought to the operating room on the above mentioned date and central monitoring was established by the anesthesia team including placement of a radial arterial line. The patient is placed in the supine position on the operating table.  Intravenous antibiotics are administered. General anesthesia is used.   Baseline transesophageal echocardiogram was performed. The patient's chest, abdomen, both groins, and both lower extremities are prepared and draped in a sterile manner. A time out procedure is performed.   PERIPHERAL ACCESS:   Using the modified Seldinger technique, femoral  arterial and venous access were obtained with placement of a 6 Fr sheath in the artery and a 7 Fr sheath in the  vein on the right side using u/s guidance.  A pigtail diagnostic catheter was passed through the femoral arterial sheath under fluoroscopic guidance into the aortic root.  A temporary transvenous pacemaker catheter was passed through the femoral venous sheath under fluoroscopic guidance into the right ventricle.  The pacemaker was tested to ensure stable lead placement and pacemaker capture. Aortic root angiography was performed in order to determine the optimal angiographic angle for valve deployment.  TRANSCAROTID ACCESS:  Please see Dr. Linward note for details of the surgical cutdown to the left carotid artery.   The patient was heparinized systemically and ACT verified > 250 seconds.    A needle was used to access the left right carotid artery and a J wire was then advanced through the needle into the ascending aorta. A 8 French sheath was advanced over the wire into the left carotid. A JR4 catheter and straight wire were used to cross the aortic valve. The JR4 was exchanged for a pigtail catheter using a J wire for the exchange. LV pressures measured. We then advanced a Safari wire into the LV apex through the pigtail catheter and removed the pigtail catheter. The 8 French sheath was removed and the 14 French E sheath was advanced into the left carotid artery and into the ascending aorta.   TRANSCATHETER HEART VALVE DEPLOYMENT:  An Edwards Sapien 3 THV (size 26 mm) was prepared and crimped per manufacturer's guidelines, and the proper orientation of the valve is confirmed on the [employer] delivery system. The valve was advanced through the introducer sheath using normal technique until in an appropriate position in the ascending aorta beyond the sheath tip. The balloon was then retracted and using the fine-tuning wheel was centered on the valve. The valve was then advanced across  aortic valve using appropriate flexion of the catheter. The valve was carefully positioned across the aortic valve annulus. The Commander catheter was retracted using normal technique. Once final position of the valve has been confirmed by angiographic assessment, the valve is deployed while temporarily holding ventilation and during rapid ventricular pacing to maintain systolic blood pressure < 50 mmHg and pulse pressure < 10 mmHg. The balloon inflation is held for >3 seconds after reaching full deployment volume. Once the balloon has fully deflated the balloon is retracted into the ascending aorta and valve function is assessed using TEE. There is felt to be no paravalvular leak and no central aortic insufficiency.  Small pericardial effusion unchanged from pre-TAVR images. The patient's hemodynamic recovery following valve deployment is good.  The deployment balloon and guidewire are both removed. Echo demostrated acceptable post-procedural gradients, stable mitral valve function, and no AI.   PROCEDURE COMPLETION:  The E sheath was removed and Dr. Daniel closed the artery and wound. Please her note for details of the surgical wound closure. Protamine was administered. The temporary pacemaker, pigtail catheters and femoral sheaths were removed with  a Mynx closure device placed in the artery and manual pressure used for venous hemostasis.    The patient tolerated the procedure well and is transported to the holding area in stable condition. There were no immediate intraoperative complications. All sponge instrument and needle counts are verified correct at completion of the operation.   No blood products were administered during the operation.  The patient received a total of 30 mL of intravenous contrast during the procedure.  LVEDP: 35 mmHg  Lonni Cash MD, FACC 09/28/2024 9:54 AM

## 2024-09-28 NOTE — Progress Notes (Signed)
  HEART AND VASCULAR CENTER   MULTIDISCIPLINARY HEART VALVE TEAM  Patient doing well s/p TAVR. He is hemodynamically stable. Carotid site stable. ECG with sinus and LVH with LAFB but no high grade block. Transferred from cath lab holding to 4E. LVEDP 35 mm hg at the time of TAVR. Treat with one dose IV lasix  60 mg/ KCL 30 meq. Early ambulation after bedrest completed and hopeful discharge over the next 24-48 hours.   Lamarr Hummer PA-C  [facility]  Pager [phone]

## 2024-09-28 NOTE — Discharge Instructions (Signed)

## 2024-09-28 NOTE — Addendum Note (Signed)
 Addendum  created 09/28/24 1530 by Jerl Donald LABOR, CRNA   Flowsheet accepted, Intraprocedure Flowsheets edited

## 2024-09-28 NOTE — Op Note (Signed)
 HEART AND VASCULAR CENTER   MULTIDISCIPLINARY HEART VALVE TEAM   TAVR OPERATIVE NOTE   Date of Procedure:  09/28/2024  Preoperative Diagnosis: Severe Aortic Stenosis   Postoperative Diagnosis: Same   Procedure:   Transcatheter Aortic Valve Replacement - Percutaneous Transcarotid Approach  Edwards Sapien 3 Ultra Resilia (size 26mm mm, model # [id number], serial #[id number]  Co-Surgeons:  Con Clunes, MD and Lonni Cash, MD   Anesthesiologist:  Tilford, MD  Echocardiographer:  Delford, MD  Pre-operative Echo Findings: Severe aortic stenosis Moderately decreased left ventricular systolic function  Post-operative Echo Findings: No paravalvular leak Unchanged left ventricular systolic function   BRIEF CLINICAL NOTE AND INDICATIONS FOR SURGERY  Corey Shaul Sr. is a 68 y.o. male presents for evaluation of severe aortic stenosis in the setting of bicuspid valve and TAVR.  He also had severe MR.  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  He has undergone surgery, XRT and chemo and is currently on Cabozentinib, although this is held right now in anticipation of TAVR.  He denies chest pain, dizziness, syncope or dizziness.  He does have some palpitations when working in the yard which goes away with rest.  He works in a [employer] and is on his feet all day performing hard labor.    DETAILS OF THE OPERATIVE PROCEDURE  PREPARATION:    The patient was brought to the operating room on the above mentioned date and appropriate monitoring was established by the anesthesia team. The patient was placed in the supine position on the operating table.  Intravenous antibiotics were administered. General endotracheal anesthesia was induced uneventfully.  Baseline transesophageal echocardiogram was performed. The patient's neck, abdomen and both groins were prepped and draped in a sterile manner. A time out procedure was performed.   PERIPHERAL ACCESS:     Using the modified Seldinger technique, femoral arterial and venous access was obtained with placement of 6 Fr sheaths on the right side.  A pigtail diagnostic catheter was passed through the right arterial sheath under fluoroscopic guidance into the aortic root.  A temporary transvenous pacemaker catheter was passed through the right femoral venous sheath under fluoroscopic guidance into the right ventricle.  The pacemaker was tested to ensure stable lead placement and pacemaker capture. Aortic root angiography was performed in order to determine the optimal angiographic angle for valve deployment.   LEFT CAROTID ACCESS:   A transverse incision was made at the base of the left neck between the heads of the sternocleidomastoid muscle and carried down through the subcutaneous tissue using electrocautery. The platysma muscle was divided. The carotid sheath was opened and the common carotid artery identified. The patient was heparinized systemically and ACT verified > 250 seconds.  A double concentric purse string suture of CV-4 gortex was placed in the anterior wall of the artery. The artery was cannulated with a needle and a J- wire advanced into the ascending aorta. An 8 F sheath was inserted over the wire. The aortic valve was crossed with an AL-2  catheter and a J wire.  This was exchanged for a pigtail catheter and position was confirmed in the LV apex.  Simultaneous pressures were obtained and the LVEDP was 30.  The pigtail catheter was exchanged for a Safari wire in the LV apex.  Then a 69F E-sheath was inserted into the carotid artery and the tip advanced into the aortic arch.   TRANSCATHETER HEART VALVE DEPLOYMENT:   An Edwards Sapien 3 Ultra  transcatheter heart valve (26 mm) was prepared and crimped per manufacturer's guidelines, and the proper orientation of the valve was confirmed on the [employer] delivery system. The valve was advanced through the introducer sheath using normal  technique until in an appropriate position in the abdominal aorta beyond the sheath tip. The balloon was then retracted and using the fine-tuning wheel was centered on the valve. The valve was then advanced across the aortic arch using appropriate flexion of the catheter. The valve was carefully positioned across the aortic valve annulus. The Commander catheter was retracted using normal technique. Once final position of the valve was confirmed by angiographic assessment, the valve was deployed during rapid ventricular pacing to maintain systolic blood pressure < 50 mmHg and pulse pressure < 10 mmHg. The balloon inflation was held for >3 seconds after reaching full deployment volume. Once the balloon was fully deflated the balloon was retracted into the ascending aorta and valve function was assessed using echocardiography. There was felt to be no paravalvular leak and no central aortic insufficiency.  The patient's hemodynamic recovery following valve deployment was good.  The deployment balloon and guidewire were both removed.    PROCEDURE COMPLETION:   The sheath was removed and carotid artery closure performed.  Protamine  was administered once carotid arterial repair was complete. The temporary pacemaker, pigtail catheter and femoral sheaths were removed with manual pressure used for venous hemostasis.  A Mynx femoral closure device was utilized following removal of the diagnostic sheath in the right femoral artery.  The patient tolerated the procedure well and was transported to the cath lab recovery area in stable condition. There were no immediate intraoperative complications. All sponge instrument and needle counts were verified correct at completion of the operation.   No blood products were administered during the operation.  The patient received a total of 30 mL of intravenous contrast during the procedure.   Con GORMAN Clunes, MD 09/28/2024 2:21 PM

## 2024-09-28 NOTE — Anesthesia Postprocedure Evaluation (Signed)
 Anesthesia Post Note  Patient: Corey Dolney Sr.  Procedure(s) Performed: Transcatheter Aortic Valve Replacement-Carotid (Left) TRANSESOPHAGEAL ECHOCARDIOGRAM     Patient location during evaluation: PACU Anesthesia Type: General Level of consciousness: awake and alert Pain management: pain level controlled Vital Signs Assessment: post-procedure vital signs reviewed and stable Respiratory status: spontaneous breathing, nonlabored ventilation, respiratory function stable and patient connected to nasal cannula oxygen Cardiovascular status: blood pressure returned to baseline and stable Postop Assessment: no apparent nausea or vomiting Anesthetic complications: no   There were no known notable events for this encounter.  Last Vitals:  Vitals:   09/28/24 1230 09/28/24 1300  BP: 126/79 (!) 143/72  Pulse: 65 67  Resp: 13 14  Temp:    SpO2: 100% 100%    Last Pain:  Vitals:   09/28/24 1144  TempSrc: Oral  PainSc: 0-No pain                 Corey Armstrong

## 2024-09-28 NOTE — Interval H&P Note (Signed)
 History and Physical Interval Note:  09/28/2024 6:30 AM  Corey Tarri Sierra Sr.  has presented today for surgery, with the diagnosis of Severe Aortic Stenosis.  The various methods of treatment have been discussed with the patient and family. After consideration of risks, benefits and other options for treatment, the patient has consented to  Procedure(s): Transcatheter Aortic Valve Replacement-Carotid (Left) TRANSESOPHAGEAL ECHOCARDIOGRAM (N/A) as a surgical intervention.  The patient's history has been reviewed, patient examined, no change in status, stable for surgery.  I have reviewed the patient's chart and labs.  Questions were answered to the patient's satisfaction.     Con [name]

## 2024-09-28 NOTE — Anesthesia Procedure Notes (Signed)
 Arterial Line Insertion Start/End11/18/2025 7:00 AM, 09/28/2024 7:10 AM Performed by: Tilford Franky BIRCH, MD, Jerl Donald LABOR, CRNA, CRNA  Patient location: Pre-op. Preanesthetic checklist: patient identified, IV checked, site marked, risks and benefits discussed, surgical consent, monitors and equipment checked, pre-op evaluation, timeout performed and anesthesia consent Lidocaine  1% used for infiltration Right, radial was placed Catheter size: 20 G Hand hygiene performed  and maximum sterile barriers used   Attempts: 1 Following insertion, dressing applied. Post procedure assessment: normal  Patient tolerated the procedure well with no immediate complications.

## 2024-09-28 NOTE — Transfer of Care (Signed)
 Immediate Anesthesia Transfer of Care Note  Patient: Corey Tarri Sierra Sr.  Procedure(s) Performed: Transcatheter Aortic Valve Replacement-Carotid (Left) TRANSESOPHAGEAL ECHOCARDIOGRAM  Patient Location: Cath Lab  Anesthesia Type:General  Level of Consciousness: awake, alert , and oriented  Airway & Oxygen Therapy: Patient Spontanous Breathing  Post-op Assessment: Report given to RN and Post -op Vital signs reviewed and stable  Post vital signs: Reviewed and stable  Last Vitals:  Vitals Value Taken Time  BP 148/76 09/28/24 10:50  Temp    Pulse 55 09/28/24 10:54  Resp 10 09/28/24 10:54  SpO2 100 % 09/28/24 10:54  Vitals shown include unfiled device data.  Last Pain:  Vitals:   09/28/24 0605  TempSrc:   PainSc: 0-No pain         Complications: There were no known notable events for this encounter.

## 2024-09-29 ENCOUNTER — Encounter (HOSPITAL_COMMUNITY): Payer: Self-pay | Admitting: Cardiovascular Disease

## 2024-09-29 ENCOUNTER — Inpatient Hospital Stay (HOSPITAL_COMMUNITY)

## 2024-09-29 DIAGNOSIS — Z952 Presence of prosthetic heart valve: Secondary | ICD-10-CM

## 2024-09-29 LAB — CBC
HCT: 31.7 % — ABNORMAL LOW (ref 39.0–52.0)
Hemoglobin: 10.3 g/dL — ABNORMAL LOW (ref 13.0–17.0)
MCH: 34 pg (ref 26.0–34.0)
MCHC: 32.5 g/dL (ref 30.0–36.0)
MCV: 104.6 fL — ABNORMAL HIGH (ref 80.0–100.0)
Platelets: 137 K/uL — ABNORMAL LOW (ref 150–400)
RBC: 3.03 MIL/uL — ABNORMAL LOW (ref 4.22–5.81)
RDW: 14.2 % (ref 11.5–15.5)
WBC: 5.2 K/uL (ref 4.0–10.5)
nRBC: 0 % (ref 0.0–0.2)

## 2024-09-29 LAB — BASIC METABOLIC PANEL WITH GFR
Anion gap: 14 (ref 5–15)
BUN: 19 mg/dL (ref 8–23)
CO2: 21 mmol/L — ABNORMAL LOW (ref 22–32)
Calcium: 9.1 mg/dL (ref 8.9–10.3)
Chloride: 105 mmol/L (ref 98–111)
Creatinine, Ser: 1.04 mg/dL (ref 0.61–1.24)
GFR, Estimated: >60 mL/min (ref >60)
Glucose, Bld: 98 mg/dL (ref 70–99)
Potassium: 4 mmol/L (ref 3.5–5.1)
Sodium: 140 mmol/L (ref 135–145)

## 2024-09-29 LAB — ECHOCARDIOGRAM COMPLETE
AR max vel: 1.35 cm2
AV Area VTI: 1.59 cm2
AV Area mean vel: 1.45 cm2
AV Mean grad: 10.8 mmHg
AV Peak grad: 22 mmHg
Ao pk vel: 2.34 m/s
Area-P 1/2: 2.61 cm2
Calc EF: 65 %
Height: 67 in
MV VTI: 1.68 cm2
S' Lateral: 2.5 cm
Single Plane A2C EF: 64.4 %
Single Plane A4C EF: 65.6 %
Weight: 2043.2 [oz_av]

## 2024-09-29 LAB — MAGNESIUM: Magnesium: 1.6 mg/dL — ABNORMAL LOW (ref 1.7–2.4)

## 2024-09-29 LAB — POCT I-STAT, CHEM 8
BUN: 22 mg/dL (ref 8–23)
Calcium, Ion: 1.29 mmol/L (ref 1.15–1.40)
Chloride: 112 mmol/L — ABNORMAL HIGH (ref 98–111)
Creatinine, Ser: 0.9 mg/dL (ref 0.61–1.24)
Glucose, Bld: 112 mg/dL — ABNORMAL HIGH (ref 70–99)
HCT: 29 % — ABNORMAL LOW (ref 39.0–52.0)
Hemoglobin: 9.9 g/dL — ABNORMAL LOW (ref 13.0–17.0)
Potassium: 4.1 mmol/L (ref 3.5–5.1)
Sodium: 143 mmol/L (ref 135–145)
TCO2: 22 mmol/L (ref 22–32)

## 2024-09-29 LAB — POCT ACTIVATED CLOTTING TIME: Activated Clotting Time: 291 s

## 2024-09-29 MED ORDER — MAGNESIUM SULFATE 4 GM/100ML IV SOLN
4.0000 g | Freq: Once | INTRAVENOUS | Status: AC
  Administered 2024-09-29: 4 g via INTRAVENOUS
  Filled 2024-09-29: qty 100

## 2024-09-29 MED ORDER — POTASSIUM CHLORIDE CRYS ER 20 MEQ PO TBCR
20.0000 meq | EXTENDED_RELEASE_TABLET | Freq: Two times a day (BID) | ORAL | Status: DC
  Administered 2024-09-29: 20 meq via ORAL
  Filled 2024-09-29: qty 1

## 2024-09-29 MED ORDER — FUROSEMIDE 10 MG/ML IJ SOLN
40.0000 mg | Freq: Once | INTRAMUSCULAR | Status: AC
  Administered 2024-09-29: 40 mg via INTRAVENOUS
  Filled 2024-09-29: qty 4

## 2024-09-29 NOTE — Progress Notes (Signed)
 CARDIAC REHAB PHASE I     Post TAVR education including site care, restrictions, risk factors, exercise guidelines, heart healthy diet, and CRP2 reviewed. Pt's pre-cardiac event activity level included walking and doing yardwork of approximately 1 hr/day.  We did not mobilize at this time.  All questions and concerns addressed. Will refer to [facility] for CRP2 and pt states he is more interested in CRP2 starting sometime in January. Plan for home later today.     Fairy JONETTA Music, RN BSN 09/29/2024 10:29 AM [id number]

## 2024-09-29 NOTE — Progress Notes (Signed)
  Echocardiogram 2D Echocardiogram has been performed.  Norleen ORN Micaela Stith 09/29/2024, 9:20 AM

## 2024-09-29 NOTE — Progress Notes (Signed)
 Mobility Specialist Progress Note:    09/29/24 0937  Mobility  Activity Ambulated independently  Level of Assistance Independent  Assistive Device None  Distance Ambulated (ft) 470 ft  Activity Response Tolerated well  Mobility Referral Yes  Mobility visit 1 Mobility  Mobility Specialist Start Time (ACUTE ONLY) [id number]  Mobility Specialist Stop Time (ACUTE ONLY) 0948  Mobility Specialist Time Calculation (min) (ACUTE ONLY) 11 min   Pt received in bed, agreeable to mobility session. Ambulated independently,, no physical assistance required. HR WFL. Returned pt to room, eager for d/c. All needs met  Makenly Larabee Mobility Specialist Please contact via SecureChat or  Rehab office at [phone]

## 2024-09-30 ENCOUNTER — Telehealth: Payer: Self-pay

## 2024-09-30 NOTE — Telephone Encounter (Signed)
 Patient contacted regarding discharge from [facility] on 09/29/24  Patient understands to follow up with provider Izetta Hummer, PA-C on 10/04/24 at 12:00PM at [address] Location. Patient understands discharge instructions? Yes Patient understands medications and regiment? Yes Patient understands to bring all medications to this visit? Yes

## 2024-10-04 ENCOUNTER — Ambulatory Visit: Attending: Physician Assistant | Admitting: Physician Assistant

## 2024-10-04 ENCOUNTER — Telehealth (HOSPITAL_COMMUNITY): Payer: Self-pay

## 2024-10-04 VITALS — BP 136/66 | HR 63 | Ht 67.0 in | Wt 129.4 lb

## 2024-10-04 DIAGNOSIS — C220 Liver cell carcinoma: Secondary | ICD-10-CM

## 2024-10-04 DIAGNOSIS — Z952 Presence of prosthetic heart valve: Secondary | ICD-10-CM

## 2024-10-04 DIAGNOSIS — I2510 Atherosclerotic heart disease of native coronary artery without angina pectoris: Secondary | ICD-10-CM

## 2024-10-04 DIAGNOSIS — I5032 Chronic diastolic (congestive) heart failure: Secondary | ICD-10-CM

## 2024-10-04 DIAGNOSIS — I10 Essential (primary) hypertension: Secondary | ICD-10-CM

## 2024-10-04 NOTE — Patient Instructions (Signed)
 Medication Instructions:  Your physician recommends that you continue on your current medications as directed. Please refer to the Current Medication list given to you today.  *If you need a refill on your cardiac medications before your next appointment, please call your pharmacy*  Lab Work: None needed. If you have labs (blood work) drawn today and your tests are completely normal, you will receive your results only by: MyChart Message (if you have MyChart) OR A paper copy in the mail If you have any lab test that is abnormal or we need to change your treatment, we will call you to review the results.  Testing/Procedures: 11/24/2023 Your physician has requested that you have an echocardiogram. Echocardiography is a painless test that uses sound waves to create images of your heart. It provides your doctor with information about the size and shape of your heart and how well your heart's chambers and valves are working. This procedure takes approximately one hour. There are no restrictions for this procedure. Please do NOT wear cologne, perfume, aftershave, or lotions (deodorant is allowed). Please arrive 15 minutes prior to your appointment time.  Please note: We ask at that you not bring children with you during ultrasound (echo/ vascular) testing. Due to room size and safety concerns, children are not allowed in the ultrasound rooms during exams. Our front office staff cannot provide observation of children in our lobby area while testing is being conducted. An adult accompanying a patient to their appointment will only be allowed in the ultrasound room at the discretion of the ultrasound technician under special circumstances. We apologize for any inconvenience.   Follow-Up: At [facility], you and your health needs are our priority.  As part of our continuing mission to provide you with exceptional heart care, our providers are all part of one team.  This team includes your primary  Cardiologist (physician) and Advanced Practice Providers or APPs (Physician Assistants and Nurse Practitioners) who all work together to provide you with the care you need, when you need it.  Your next appointment:   As scheduled on 12/04/23 Provider:   Shelda Bruckner, MD or Annabella Scarce, MD    We recommend signing up for the patient portal called MyChart.  Sign up information is provided on this After Visit Summary.  MyChart is used to connect with patients for Virtual Visits (Telemedicine).  Patients are able to view lab/test results, encounter notes, upcoming appointments, etc.  Non-urgent messages can be sent to your provider as well.   To learn more about what you can do with MyChart, go to [email or web address].

## 2024-10-04 NOTE — Telephone Encounter (Signed)
 Pt is interested in the cardiac rehab and would like to start in January of 2026. Will call pt to get scheduled after the new year.

## 2024-10-04 NOTE — Progress Notes (Signed)
 HEART AND VASCULAR CENTER   MULTIDISCIPLINARY HEART VALVE CLINIC                                     Cardiology Office Note:    Date:  10/04/2024   ID:  Corey Tarri Sierra Sr., DOB Apr 17, 1956, MRN [id number]  PCP:  Corey Charlie FERNS, MD  [facility] Cardiologist:  Corey Scarce, MD  [facility] Structural heart: Corey Cash, MD [facility] Electrophysiologist:  None   Referring MD: Corey Charlie FERNS, MD   [facility] s/p TAVR  History of Present Illness:    Corey Alberto Sr. is a 68 y.o. 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   Admitted 8/30-07/12/24 for acute HFrEF. Echo 07/11/24 showed EF 30%, mild LVH, mild RV dysfunction, severe MR, severe AS w/ mean grad 33 mmHg, Vmax 3.87 m/s, AVA 0.76 cm2, DVI 0.24, SVI 35, and moderate AI. [facility] 08/05/24 showed severe calcific 80% proximal LAD stenosis and diffuse 50% mid LAD stenosis, severe mid circumflex stenosis after the first OM and moderate RCA stenoses without high-grade disease. RHC: RA mean 4 mmHg. RV 43/7 mmHg. PA 47/16 mean 25 mmHg. Pulmonary wedge pressure A-wave 17, V wave 15, mean 13 mmHg. LVEDP 8 mmHg. CO/CI 4.6/2.7. 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 He has undergone surgery, XRT and chemo and is currently on Cabozentinib which was held in anticipation of TAVR. 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 S/p successful TAVR with a 26 mm Edwards Sapien 3 Ultra Resilia THV via the left carotid approach on 09/28/24. Post operative echo showed EF 65%, normally functioning TAVR with a mean gradient of 10.8 mmHg and no PVL. LVEDP 35 mm hg at the time of TAVR and he was treated with IV lasix .   Today the patient presents to clinic for follow up. Here with wife. No CP or SOB. No LE edema,  orthopnea or PND. No dizziness or syncope. No blood in stool or urine. No palpitations. He can tell a difference in how he is breathing.   Past Medical History:  Diagnosis Date   Hepatitis    HX OF hEP c ?    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 Cash and Dr. Daniel.   Severe aortic stenosis      Current Medications: Current Meds  Medication Sig   acetaminophen  (TYLENOL ) 500 MG tablet Take 1,000 mg by mouth every 6 (six) hours as needed (pain.).   aspirin  EC 81 MG tablet Take 1 tablet (81 mg total) by mouth daily. Swallow whole.   atorvastatin  (LIPITOR) 40 MG tablet Take 1 tablet (40 mg total) by mouth daily.   [Paused] CABOMETYX  60 MG tablet TAKE 1 TABLET BY MOUTH 1 TIME A DAY ON AN EMPTY STOMACH, 1 HOUR BEFORE OR 2 HOURS AFTER MEALS.   furosemide  (LASIX ) 40 MG tablet Take 1 tablet (40 mg total) by mouth daily.   hydrALAZINE  (APRESOLINE ) 10 MG tablet TAKE 1 TABLET 2 (TWO) TIMES DAILY  AS NEEDED. TAKE FOR SYSTOLIC BLOOD PRESSURE MORE THAN 170. (Patient taking differently: Pt only takes when BP is really high)   metoprolol  succinate (TOPROL  XL) 25 MG 24 hr tablet Take 1 tablet (25 mg total) by mouth daily.   olmesartan  (BENICAR ) 20 MG tablet Take 1 tablet (20 mg total) by mouth daily.   pantoprazole  (PROTONIX ) 20 MG tablet Take 20 mg by mouth daily.   potassium chloride  SA (KLOR-CON  M) 20 MEQ tablet Take 1 tablet (20 mEq total) by mouth daily.      ROS:   Please see the history of present illness.    All other systems reviewed and are negative.  EKGs   EKG Interpretation Date/Time:  Monday October 04 2024 11:56:32 EST Ventricular Rate:  62 PR Interval:  170 QRS Duration:  100 QT Interval:  432 QTC Calculation: 438 R Axis:   -34  Text Interpretation: Normal sinus rhythm Left axis deviation Left ventricular  hypertrophy with repolarization abnormality ( R in aVL , Cornell product ) Cannot rule out Septal infarct , age undetermined Confirmed by Sebastian Collar [phone]) on 10/04/2024 12:00:26 PM   Risk Assessment/Calculations:           Physical Exam:    VS:  BP 136/66   Pulse 63   Ht 5' 7 (1.702 m)   Wt 129 lb 6.4 oz (58.7 kg)   SpO2 96%   BMI 20.27 kg/m     Wt Readings from Last 3 Encounters:  10/04/24 129 lb 6.4 oz (58.7 kg)  09/29/24 127 lb 11.2 oz (57.9 kg)  09/16/24 132 lb (59.9 kg)     GEN: Well nourished, well developed in no acute distress NECK: No JVD CARDIAC: RRR, no murmurs, rubs, gallops RESPIRATORY:  Clear to auscultation without rales, wheezing or rhonchi  ABDOMEN: Soft, non-tender, non-distended EXTREMITIES:  No edema; No deformity.  Groin/carotid sites clear without hematoma or ecchymosis.  ASSESSMENT:    1. S/P TAVR (transcatheter aortic valve replacement)   2. Chronic heart failure with preserved ejection fraction (HFpEF) (HCC)   3. Essential hypertension   4. Coronary artery disease involving native coronary artery of native heart without angina pectoris   5. Hepatocellular carcinoma in adult [facility])     PLAN:    In order of problems listed above:  Severe AS s/p TAVR:  -- Pt doing great s/p TAVR.  -- ECG with no HAVB.  -- Groin/carotid sites clear  -- SBE prophylaxis discussed; the patient is edentulous and does not go to the dentist.  -- Continue Aspirin  81mg  daily. -- Cleared to resume all activities without restriction. -- He will be seen back by Dr. Raford in January which will count as his 1 month apt. Echo made for week prior.   Chronic HFrEF: -- Appears euvolemic.  -- Continue Lasix  40mg  daily and KCL 20meq daily.   HTN: -- BP well controlled.  -- Continue Toprol  XL 25mg  daily and olmesartan  20mg  daily. -- Continue hydralazine  10mg  BID as needed.    CAD: -- [facility] 08/05/24 showed severe calcific 80% proximal LAD stenosis and diffuse  50% mid LAD stenosis, severe mid circumflex stenosis after the first OM and moderate RCA stenoses without high-grade disease.  -- Continue to treat medically.  -- Continue aspirin  81mg  daily and atorvastatin  40mg  daily.   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 Lanny.  -- Follow up 10/11/24 to discuss when to restart Cabozentinib.      Cardiac Rehabilitation Eligibility Assessment  The patient is ready to start cardiac rehabilitation from a cardiac standpoint.     Medication Adjustments/Labs and Tests Ordered: Current medicines are reviewed at length with the patient today.  Concerns regarding medicines are outlined above.  Orders Placed This Encounter  Procedures   EKG 12-Lead   ECHOCARDIOGRAM COMPLETE   ECHOCARDIOGRAM COMPLETE   No orders of the defined types were placed in this encounter.   Patient Instructions  Medication Instructions:  Your physician recommends that you continue on your current medications as directed. Please refer to the Current Medication list given to you today.  *If you need a refill on your cardiac medications before your next appointment, please call your pharmacy*  Lab Work: None needed. If you have labs (blood work) drawn today and your tests are completely normal, you will receive your results only by: MyChart Message (if you have MyChart) OR A paper copy in the mail If you have any lab test that is abnormal or we need to change your treatment, we will call you to review the results.  Testing/Procedures: 11/24/2023 Your physician has requested that you have an echocardiogram. Echocardiography is a painless test that uses sound waves to create images of your heart. It provides your doctor with information about the size and shape of your heart and how well your heart's chambers and valves are working. This procedure takes approximately one hour. There are no restrictions for this procedure. Please do NOT wear  cologne, perfume, aftershave, or lotions (deodorant is allowed). Please arrive 15 minutes prior to your appointment time.  Please note: We ask at that you not bring children with you during ultrasound (echo/ vascular) testing. Due to room size and safety concerns, children are not allowed in the ultrasound rooms during exams. Our front office staff cannot provide observation of children in our lobby area while testing is being conducted. An adult accompanying a patient to their appointment will only be allowed in the ultrasound room at the discretion of the ultrasound technician under special circumstances. We apologize for any inconvenience.   Follow-Up: At [facility], you and your health needs are our priority.  As part of our continuing mission to provide you with exceptional heart care, our providers are all part of one team.  This team includes your primary Cardiologist (physician) and Advanced Practice Providers or APPs (Physician Assistants and Nurse Practitioners) who all work together to provide you with the care you need, when you need it.  Your next appointment:   As scheduled on 12/04/23 Provider:   Shelda Bruckner, MD or Corey Scarce, MD    We recommend signing up for the patient portal called MyChart.  Sign up information is provided on this After Visit Summary.  MyChart is used to connect with patients for Virtual Visits (Telemedicine).  Patients are able to view lab/test results, encounter notes, upcoming appointments, etc.  Non-urgent messages can be sent to your provider as well.   To learn more about what you can do with MyChart, go to [email or web address].             Signed, Lamarr Hummer, PA-C  10/04/2024 1:02 PM    [facility]

## 2024-10-10 ENCOUNTER — Encounter: Payer: Self-pay | Admitting: Hematology

## 2024-10-10 NOTE — Assessment & Plan Note (Signed)
 diagnosed 11/2019 by MRI for f/u of cirrhosis. S/p microwave ablation on 03/01/20. -MRI 06/01/22 showed suspicion for local recurrence and metastasis at L5. Biopsy of L5 confirmed metastatic HCC. S/p repeat microwave ablation on 07/17/22. -started lenvima  08/16/22 -staging CT CAP and bone scan on 09/19/22 showed: interval enlargement of right hepatic lobe HCC; new retropulsed bony fragment at L5; no other new lesions. His previous CT was in 05/2022 and he did not start Lenvima  until 07/2022, so it's hard to evaluate if he is responding to Lenvima  or not, the progression is likely the nature course of his [facility]  -Restaging CT scan from January 02, 2023 showed slightly improved previously treated liver lesion, and additional area of concern in the liver which are difficult to compare with previous scan due to different timing of contrast.  No other evidence of disease progression.  Plan to obtain MRI as next scan.  I personally reviewed the CT scan images and discussed the findings with patient -Will continue Lenvima , he is tolerating well overall. -Due to his weight being above 60 kg now, I increased his Lenvima  to 12 mg daily in Feb 2024 -repeated liver MRI on 04/23/2023 showed stable treated liver lesion, L5 bone mets has slightly increased in size.  Patient developed worsening low back pain in July 2024, repeated lumbar MRI showed significant disease progression in the L5 bone lesion.  I personally reviewed the scan images and discussed the findings with patient. -I recommend palliative radiation to his L5 lesion for pain control -I recommend changing systemic treatment to immunotherapy, I discussed option of atezolizumab and bevacizumab, durvalumab  and Imjudo , nivo and ipi etc. since he previously progressed on oral TKI, I recommend him to proceed with durvalumab  and Imjudo . He started on 06/30/23 -the goal of therapy is curative/palliative to prolong life and prevent/improve cancer related symptoms. -he underwent  Bilateral laminotomies L5-S1 with decompression of the spinal canal and the L5 nerve roots epidural tumor by Dr. Colon On 11/20/2023 -he received SBRT to low back in Feb 2025 -CT and abdominal MRI showed new enhancing lesion posteriorly in segment 7, suspicious for recurrence/metastasis, otherwise stable treated liver lesions and bone mets.  I discussed option of ablation versus SBRT radiation.  Patient prefers SBRT. -due to worsening LFTs, I stopped Durvalumab  in May 2025  -I referred him back to Dr. Patrcia and plan to change systemic therapy to carbozentinib when his LFT improves  -He started SBRT to new oligo liver met on 05/05/2024 and finished on 05/17/24 -He started ramping dose of cabozantinib on May 24, 2024. He is tolerating full dose well. It was held in Oct 2025 due to Transcatheter Aortic Valve Replacement on 09/28/2024 and restarted in early Dec 2025

## 2024-10-11 ENCOUNTER — Inpatient Hospital Stay: Attending: Hematology | Admitting: Hematology

## 2024-10-11 DIAGNOSIS — I700 Atherosclerosis of aorta: Secondary | ICD-10-CM | POA: Diagnosis not present

## 2024-10-11 DIAGNOSIS — I10 Essential (primary) hypertension: Secondary | ICD-10-CM | POA: Diagnosis not present

## 2024-10-11 DIAGNOSIS — N200 Calculus of kidney: Secondary | ICD-10-CM | POA: Diagnosis not present

## 2024-10-11 DIAGNOSIS — C7951 Secondary malignant neoplasm of bone: Secondary | ICD-10-CM | POA: Diagnosis not present

## 2024-10-11 DIAGNOSIS — I2510 Atherosclerotic heart disease of native coronary artery without angina pectoris: Secondary | ICD-10-CM | POA: Insufficient documentation

## 2024-10-11 DIAGNOSIS — Z79899 Other long term (current) drug therapy: Secondary | ICD-10-CM | POA: Insufficient documentation

## 2024-10-11 DIAGNOSIS — I350 Nonrheumatic aortic (valve) stenosis: Secondary | ICD-10-CM | POA: Diagnosis not present

## 2024-10-11 DIAGNOSIS — R911 Solitary pulmonary nodule: Secondary | ICD-10-CM | POA: Diagnosis not present

## 2024-10-11 DIAGNOSIS — N281 Cyst of kidney, acquired: Secondary | ICD-10-CM | POA: Diagnosis not present

## 2024-10-11 DIAGNOSIS — K7460 Unspecified cirrhosis of liver: Secondary | ICD-10-CM | POA: Diagnosis not present

## 2024-10-11 DIAGNOSIS — Z7982 Long term (current) use of aspirin: Secondary | ICD-10-CM | POA: Insufficient documentation

## 2024-10-11 DIAGNOSIS — C220 Liver cell carcinoma: Secondary | ICD-10-CM | POA: Insufficient documentation

## 2024-10-11 NOTE — Progress Notes (Signed)
 [facility]   [phone] Fax:([phone]) [phone]   Clinic Follow up Note   Patient Care Team: [name], MD as PCP - General (Internal Medicine) [name], MD as PCP - Cardiology (Cardiology) [name], MD as PCP - Structural Heart (Cardiology) [name], MD as Attending Physician (Hematology and Oncology) [name], [name], NP as Nurse Practitioner (Hospice and Palliative Medicine) Imaging, [facility] as Radiologist (Diagnostic Radiology) 10/11/2024  I connected with Corey Tarri Armstrong Sr. on 10/11/24 at  4:00 PM EST by telephone and verified that I am speaking with the correct person using two identifiers.   I discussed the limitations, risks, security and privacy concerns of performing an evaluation and management service by telephone and the availability of in person appointments. I also discussed with the patient that there may be a patient responsible charge related to this service. The patient expressed understanding and agreed to proceed.   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 S/p microwave ablation on 03/01/20. -MRI 06/01/22 showed suspicion for local recurrence and metastasis at L5. Biopsy of L5 confirmed metastatic HCC. S/p repeat microwave ablation on 07/17/22. -started lenvima  08/16/22 -staging CT CAP and bone scan on 09/19/22 showed: interval enlargement of right hepatic lobe HCC; new retropulsed bony fragment at L5; no other new lesions. His previous CT was in 05/2022 and he did not start Lenvima  until 07/2022, so it's hard to evaluate if he is responding to Lenvima  or not, the progression is likely the nature course of his [facility]  -Restaging CT scan from January 02, 2023 showed slightly  improved previously treated liver lesion, and additional area of concern in the liver which are difficult to compare with previous scan due to different timing of contrast.  No other evidence of disease progression.  Plan to obtain MRI as next scan.  I personally reviewed the CT scan images and discussed the findings with patient -Will continue Lenvima , he is tolerating well overall. -Due to his weight being above 60 kg now, I increased his Lenvima  to 12 mg daily in Feb 2024 -repeated liver MRI on 04/23/2023 showed stable treated liver lesion, L5 bone mets has slightly increased in size.  Patient developed worsening low back pain in July 2024, repeated lumbar MRI showed significant disease progression in the L5 bone lesion.  I personally reviewed the scan images and discussed the findings with patient. -I recommend palliative radiation to his L5 lesion for pain control -I recommend changing systemic treatment to immunotherapy, I discussed option of atezolizumab and bevacizumab, durvalumab  and Imjudo , nivo and ipi etc. since he previously progressed on oral TKI, I recommend him to proceed with durvalumab  and Imjudo . He started on 06/30/23 -the goal of therapy is curative/palliative to prolong life and prevent/improve cancer related symptoms. -he underwent Bilateral laminotomies L5-S1 with decompression of the spinal canal and the L5 nerve roots epidural tumor by Dr. Colon On 11/20/2023 -he received SBRT to low back in Feb 2025 -CT and abdominal MRI showed new enhancing lesion posteriorly in segment 7, suspicious for recurrence/metastasis, otherwise stable treated liver lesions and bone mets.  I discussed option of ablation versus SBRT radiation.  Patient prefers SBRT. -due to worsening LFTs, I stopped Durvalumab  in May 2025  -I referred him back to Dr. Patrcia  and plan to change systemic therapy to carbozentinib when his LFT improves  -He started SBRT to new oligo liver met on 05/05/2024 and finished on  05/17/24 -He started ramping dose of cabozantinib on May 24, 2024. He is tolerating full dose well. It was held in Oct 2025 due to Transcatheter Aortic Valve Replacement on 09/28/2024 and restarted in early Dec 2025    Assessment & Plan Hepatocellular carcinoma  - He has recovered well from his aortic valve replacement surgery  - Plan to restart cabozantinib later this week, he has adequate supply at home  -no signs of cancer in the chest on recent CT. Last CT abdomen and pelvis was in September, over two months ago. - Will order CT abdomen and pelvis during the week off for Christmas. - Will schedule follow-up appointment the week after the CT scan.     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 (LI-RADS Category 5: Definitely HCC)   No evidence of abdominal metastatic disease or other significant abnormality.   03/01/2020 Initial Diagnosis   Hepatocellular carcinoma (HCC)   03/01/2020 Procedure   CT GUIDE TISSUE ABLATION     IMPRESSION: Successful percutaneous thermal ablation.   Signed,   [name], MD, RPVI   06/02/2020 Imaging   MR ABDOMEN WWO CONTRAST   IMPRESSION: 1. There has been interval ablation at two adjacent sites of the subcapsular right lobe of the liver, hepatic segment VI. There is no residual contrast enhancement at these adjacent sites. LI-RADS category 5 TR, nonviable. 2. There are scattered, subcentimeter foci of arterial hyperenhancement of the anterior right lobe of the liver, for example adjacent to the ablation site measuring 3 mm and in the medial anterior right lobe of the liver measuring 6 mm. This larger focus is unchanged compared to prior. Others were not previously seen. These  findings are nonspecific and consistent with LI-RADS category 3. Attention on follow-up.   09/02/2020 Imaging   MR ABDOMEN WWO CONTRAST     IMPRESSION: 1. Post radiofrequency ablation along the margin of the RIGHT hemi liver. Heterogeneous appearance of this area on both T1 and T2. No sign of residual enhancement. LR TR nonviable. 2. Very subtle focus of restricted diffusion in the cephalad lateral segment LEFT hepatic lobe measuring 7 mm, corresponding to low signal on image 27 of series 21. Arterial enhancement is not seen in this area and this areas not seen on prior imaging. Not clear whether this represents a new lesion as this area is obscured largely by artifact on most sequences on today's study and on previous imaging evaluations. LR category 3, consider a 3 to six-month follow-up with abdominal MRI. 3. Lesions of concern scattered about the RIGHT hepatic lobe may represent perfusional anomalies as they were not seen on today's study. 4. Stable mild dilation of the biliary tree.   11/27/2020 Imaging   MR ABDOMEN WWO CONTRAST   IMPRESSION: Minimal decrease in size of ablation defect in the peripheral anterior right hepatic lobe. No evidence of locally recurrent hepatocellular carcinoma or other suspicious liver lesions.   No evidence of metastatic disease or other acute findings.   04/03/2021 Imaging   MR ABDOMEN WWO CONTRAST   IMPRESSION: Stable ablation defect in the lateral right hepatic lobe, without evidence of recurrent  tumor at this site.   5 mm hypovascular lesion in segment 5 of the right lobe was not definitely seen on previous study, but is too small to characterize. This shows no arterial phase hyperenhancement. Recommend continued follow-up by MRI in 6 months.   No evidence of abdominal metastatic disease.        09/03/2021 Imaging   MR ABDOMEN WWO CONTRAST   IMPRESSION: 1. Side-by-side ablation sites in the right hepatic lobe in segment 6.  The more anterior site remains largely cystic in appearance and no findings suspicious recurrent tumor in this area. There is however abnormal contrast enhancement around the larger more posterior and lateral ablation site worrisome for recurrent tumor. 2. Several small foci of early arterial phase enhancement in the liver. The peripheral lesions are likely vascular shunts. The more central lesions are likely dysplastic nodules. Recommend continued surveillance. 3. Stable bilateral renal cysts.   12/07/2021 Imaging   MR ABDOMEN WWO CONTRAST  IMPRESSION: 1. Ablation defects in the right lobe of the liver, similar to the prior study, without definitive evidence to suggest local recurrence of disease or metastatic disease. 2. Multiple tiny arterial phase areas of nodular hyperenhancement scattered throughout the liver, predominantly in the right lobe of the liver, without perceptible signal abnormality or diffusion restriction on other pulse sequences, nonspecific, and likely small benign areas of vascular shunting. Continued attention on follow-up imaging is recommended. 3. Multiple Bosniak class 1 and Bosniak class 2 cysts in the kidneys bilaterally, similar to the prior study, as above. 4. Aortic atherosclerosis.     06/01/2022 Imaging   MR ABDOMEN WWO CONTRAST   IMPRESSION: 1. LR TR equivocal but suspicious findings with respect to previously treated disease in the RIGHT hepatic lobe given nodular enhancement adjacent and anterior superior to the ablation zone as described. 2. Given lack of late arterial phase due to bolus timing a second area of abnormality within the liver is suspicious for additional site of hepatocellular carcinoma showing washout appearance and capsule appearance on current imaging, cardiac pulsation limiting assessment in the area of concern. 3. Suspect bony metastatic disease in the lower lumbar spine at L5 level. Dedicated spinal imaging may be  helpful for further evaluation.     06/18/2022 Imaging   MR Lumbar Spine W Wo Contrast   IMPRESSION: L5 vertebral body lesion is most consistent with a metastasis. There is probable trace ventral epidural extension   07/17/2022 Procedure   CT GUIDE TISSUE ABLATION   IMPRESSION: Successful CT-guided microwave ablation of recurrent hepatocellular carcinoma.   07/17/2022 Procedure   CT BONE TROCAR/NEEDLE BIOPSY SUPERFICIAL   IMPRESSION: Successful CT-guided core biopsy of L5 bone lesion.      07/17/2022 Pathology Results   SURGICAL PATHOLOGY  CASE: [id number]  PATIENT: Corey Armstrong  Surgical Pathology Report   SURGICAL PATHOLOGY  CASE: [id number]  PATIENT: Corey Armstrong  Surgical Pathology Report      Clinical History: Hx of cirrhosis and small HCC with unexpected  suspicious lesion at L5. Rare HCC met to bone vs atypical hemangioma or  met from new primary. (crm)      FINAL MICROSCOPIC DIAGNOSIS:   A. 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 No acute findings within the chest. No suspicious findings for metastatic disease within the chest. 2. Unchanged 3 mm nodule within the medial  right upper lobe. No new or suspicious lung nodules. 3. Suboptimal visualization of the known liver lesions, reflecting lack of IV contrast material. Similar appearance of ablation defect within the lateral right lobe of liver. 4. Left renal calculi. 5. Coronary artery calcifications. 6.  Aortic Atherosclerosis   06/25/2023 Imaging   NM Bone Scan whole body  IMPRESSION: 1. Known L5 metastasis demonstrates low level uptake, mildly increased from previous bone scan. 2. No new lesions demonstrated.   06/30/2023 - 03/10/2024 Chemotherapy   Patient is on Treatment Plan : Hepatocellular Carcinoma  Tremelimumab -actl C1 D1 + Durvalumab  q28d       Imaging       Discussed the use of AI scribe software for clinical note transcription with the patient, who gave verbal consent to proceed.  History of Present Illness Corey Tukes Sr. is a 68 year old male with hepatocellular carcinoma who presents for follow-up.  He is following up after recent surgery related to his hepatocellular carcinoma. He has a 1-inch neck incision covered with liquid bandage that is healing well.  He has seen his cardiologist after surgery.  He is taking cabozantinib 6 mg once daily on an empty stomach and has two full bottles at home.     REVIEW OF SYSTEMS:   Constitutional: Denies fevers, chills or abnormal weight loss Eyes: Denies blurriness of vision Ears, nose, mouth, throat, and face: Denies mucositis or sore throat Respiratory: Denies cough, dyspnea or wheezes Cardiovascular: Denies palpitation, chest discomfort or lower extremity swelling Gastrointestinal:  Denies nausea, heartburn or change in bowel habits Skin: Denies abnormal skin rashes Lymphatics: Denies new lymphadenopathy or easy bruising Neurological:Denies numbness, tingling or new weaknesses Behavioral/Psych: Mood is stable, no new changes  All other systems were reviewed with the patient and are negative.  MEDICAL HISTORY:  Past Medical History:  Diagnosis Date   Hepatitis    HX OF hEP c ?    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 Verlin and Dr. Daniel.   Severe aortic stenosis     SURGICAL HISTORY: Past Surgical History:  Procedure Laterality Date   BREAST BIOPSY Left 03/26/2024   US  LT BREAST BX W LOC DEV 1ST LESION IMG BX SPEC US  GUIDE 03/26/2024 [facility] MAMMOGRAPHY   DECOMPRESSIVE LUMBAR LAMINECTOMY LEVEL 1 N/A 11/20/2023   Procedure: lumbar  laminectomy right Lumbar five with decompression of nerve roots and excision of tumor;  Surgeon: Colon Shove, MD;  Location: [facility] OR;  Service: Neurosurgery;  Laterality: N/A;   IR RADIOLOGIST EVAL & MGMT  01/25/2020   IR RADIOLOGIST EVAL & MGMT  03/21/2020   IR RADIOLOGIST EVAL & MGMT  06/07/2020   IR RADIOLOGIST EVAL & MGMT  09/05/2020   IR RADIOLOGIST EVAL & MGMT  12/05/2020   IR RADIOLOGIST EVAL & MGMT  05/09/2021   IR RADIOLOGIST EVAL & MGMT  09/06/2021   IR RADIOLOGIST EVAL & MGMT  12/12/2021   IR RADIOLOGIST EVAL & MGMT  06/25/2022   IR RADIOLOGIST EVAL & MGMT  07/24/2022   RADIOLOGY WITH ANESTHESIA N/A 03/01/2020   Procedure: CT WITH ANESTHESIA  THERMAL MICROWAVE  ABLATIION;  Surgeon: Karalee Beat, MD;  Location: [facility] ORS;  Service: Anesthesiology;  Laterality: N/A;   RADIOLOGY WITH ANESTHESIA N/A 07/17/2022   Procedure: CT MICROWAVE ABLATION;  Surgeon: Karalee Beat  K, MD;  Location: [facility] ORS;  Service: Radiology;  Laterality: N/A;   RIGHT/LEFT HEART CATH AND CORONARY ANGIOGRAPHY N/A 08/05/2024   Procedure: RIGHT/LEFT HEART CATH AND CORONARY ANGIOGRAPHY;  Surgeon: Wonda Sharper, MD;  Location[facility] INVASIVE CV LAB;  Service: Cardiovascular;  Laterality: N/A;   SPINE SURGERY     TRANSCATHETER AORTIC VALVE REPLACEMENT,CAROTID Left 09/28/2024   Procedure: Transcatheter Aortic Valve Replacement-Carotid;  Surgeon: [name], MD;  Location: [facility] INVASIVE CV LAB;  Service: Cardiovascular;  Laterality: Left;   TRANSESOPHAGEAL ECHOCARDIOGRAM (CATH LAB) N/A 09/28/2024   Procedure: TRANSESOPHAGEAL ECHOCARDIOGRAM;  Surgeon: [name], MD;  Location: [facility] INVASIVE CV LAB;  Service: Cardiovascular;  Laterality: N/A;    I have reviewed the social history and family history with the patient and they are unchanged from previous note.  ALLERGIES:  has no known allergies.  MEDICATIONS:  Current Outpatient Medications  Medication Sig Dispense Refill   acetaminophen   (TYLENOL ) 500 MG tablet Take 1,000 mg by mouth every 6 (six) hours as needed (pain.).     aspirin  EC 81 MG tablet Take 1 tablet (81 mg total) by mouth daily. Swallow whole. 30 tablet 12   atorvastatin  (LIPITOR) 40 MG tablet Take 1 tablet (40 mg total) by mouth daily. 30 tablet 11   CABOMETYX  60 MG tablet TAKE 1 TABLET BY MOUTH 1 TIME A DAY ON AN EMPTY STOMACH, 1 HOUR BEFORE OR 2 HOURS AFTER MEALS. 30 tablet 1   furosemide  (LASIX ) 40 MG tablet Take 1 tablet (40 mg total) by mouth daily.     hydrALAZINE  (APRESOLINE ) 10 MG tablet TAKE 1 TABLET 2 (TWO) TIMES DAILY AS NEEDED. TAKE FOR SYSTOLIC BLOOD PRESSURE MORE THAN 170. (Patient taking differently: Pt only takes when BP is really high) 180 tablet 3   metoprolol  succinate (TOPROL  XL) 25 MG 24 hr tablet Take 1 tablet (25 mg total) by mouth daily. 90 tablet 3   olmesartan  (BENICAR ) 20 MG tablet Take 1 tablet (20 mg total) by mouth daily. 30 tablet 11   pantoprazole  (PROTONIX ) 20 MG tablet Take 20 mg by mouth daily.     potassium chloride  SA (KLOR-CON  M) 20 MEQ tablet Take 1 tablet (20 mEq total) by mouth daily. 90 tablet 0   No current facility-administered medications for this visit.    PHYSICAL EXAMINATION: Not performed   LABORATORY DATA:  I have reviewed the data as listed    Latest Ref Rng & Units 09/29/2024    3:26 AM 09/28/2024   10:02 AM 09/24/2024    9:00 AM  CBC  WBC 4.0 - 10.5 K/uL 5.2   3.3   Hemoglobin 13.0 - 17.0 g/dL 89.6  9.9  88.4   Hematocrit 39.0 - 52.0 % 31.7  29.0  35.5   Platelets 150 - 400 K/uL 137   148         Latest Ref Rng & Units 09/29/2024    3:26 AM 09/28/2024   10:02 AM 09/24/2024    9:00 AM  CMP  Glucose 70 - 99 mg/dL 98  887  64   BUN 8 - 23 mg/dL 19  22  20    Creatinine 0.61 - 1.24 mg/dL 8.95  9.09  8.94   Sodium 135 - 145 mmol/L 140  143  142   Potassium 3.5 - 5.1 mmol/L 4.0  4.1  4.3   Chloride 98 - 111 mmol/L 105  112  110   CO2 22 - 32 mmol/L 21   23  Calcium  8.9 - 10.3 mg/dL 9.1   9.6    Total Protein 6.5 - 8.1 g/dL   7.3   Total Bilirubin 0.0 - 1.2 mg/dL   0.9   Alkaline Phos 38 - 126 U/L   232   AST 15 - 41 U/L   116   ALT 0 - 44 U/L   107       RADIOGRAPHIC STUDIES: I have personally reviewed the radiological images as listed and agreed with the findings in the report. No results found.     I discussed the assessment and treatment plan with the patient. The patient was provided an opportunity to ask questions and all were answered. The patient agreed with the plan and demonstrated an understanding of the instructions.   The patient was advised to call back or seek an in-person evaluation if the symptoms worsen or if the condition fails to improve as anticipated.  I provided 15 minutes of non face-to-face telephone visit time during this encounter, including review of chart and various tests results, discussions about plan of care and coordination of care plan.    Onita Mattock, MD 10/11/24

## 2024-10-18 ENCOUNTER — Encounter: Payer: Self-pay | Admitting: Hematology

## 2024-10-18 ENCOUNTER — Other Ambulatory Visit: Payer: Self-pay

## 2024-10-27 ENCOUNTER — Ambulatory Visit (HOSPITAL_COMMUNITY): Admission: RE | Admit: 2024-10-27 | Discharge: 2024-10-27 | Attending: Hematology

## 2024-10-27 ENCOUNTER — Telehealth: Payer: Self-pay | Admitting: Hematology

## 2024-10-27 DIAGNOSIS — C220 Liver cell carcinoma: Secondary | ICD-10-CM | POA: Diagnosis present

## 2024-10-27 MED ORDER — IOHEXOL 300 MG/ML  SOLN
100.0000 mL | Freq: Once | INTRAMUSCULAR | Status: AC | PRN
  Administered 2024-10-27: 18:00:00 100 mL via INTRAVENOUS

## 2024-10-28 ENCOUNTER — Other Ambulatory Visit: Payer: Self-pay | Admitting: Hematology

## 2024-11-02 ENCOUNTER — Inpatient Hospital Stay

## 2024-11-02 ENCOUNTER — Inpatient Hospital Stay: Admitting: Hematology

## 2024-11-19 ENCOUNTER — Telehealth (HOSPITAL_COMMUNITY): Payer: Self-pay

## 2024-11-19 NOTE — Telephone Encounter (Signed)
 Attempted to schedule cardiac rehab- no answer, left message. Sent MyChart message.

## 2024-11-19 NOTE — Telephone Encounter (Signed)
 Pt insurance is active and benefits verified through [facility]. Co-pay $0, DED $400/$0 met, out of pocket $3,500/$10 met, co-insurance 20%. No pre-authorization required. 11/19/2024 @ 10:39am, spoke with [facility], REF# [id number].  2ndary insurance is active and benefits verified through [employer]. Co-pay $0, DED $283/$0 met, out of pocket $0/$0 met, co-insurance 20%. No pre-authorization required. [employer], 11/19/2024 @ 10:35am, REF# [id number].   TCR/ICR? ICR Visit(date of service)limitation? 36 visits Can multiple codes be used on the same date of service/visit?(IF ITS A LIMIT) Yes  Is this a lifetime maximum or an annual maximum? Annual Has the member used any of these services to date? No Is there a time limit (weeks/months) on start of program and/or program completion? No

## 2024-11-22 ENCOUNTER — Other Ambulatory Visit: Payer: Self-pay | Admitting: Hematology

## 2024-11-23 ENCOUNTER — Other Ambulatory Visit (INDEPENDENT_AMBULATORY_CARE_PROVIDER_SITE_OTHER)

## 2024-11-23 ENCOUNTER — Ambulatory Visit: Payer: Self-pay | Admitting: Physician Assistant

## 2024-11-23 DIAGNOSIS — Z952 Presence of prosthetic heart valve: Secondary | ICD-10-CM

## 2024-11-23 LAB — ECHOCARDIOGRAM COMPLETE
AR max vel: 1.14 cm2
AV Area VTI: 1.25 cm2
AV Area mean vel: 1.04 cm2
AV Mean grad: 7 mmHg
AV Peak grad: 12.3 mmHg
Ao pk vel: 1.75 m/s
Area-P 1/2: 3.12 cm2
Calc EF: 36.7 %
P 1/2 time: 226 ms
S' Lateral: 2.97 cm
Single Plane A2C EF: 39.7 %
Single Plane A4C EF: 33 %

## 2024-11-23 NOTE — Assessment & Plan Note (Signed)
 diagnosed 11/2019 by MRI for f/u of cirrhosis. S/p microwave ablation on 03/01/20. -MRI 06/01/22 showed suspicion for local recurrence and metastasis at L5. Biopsy of L5 confirmed metastatic HCC. S/p repeat microwave ablation on 07/17/22. -started lenvima  08/16/22 -staging CT CAP and bone scan on 09/19/22 showed: interval enlargement of right hepatic lobe HCC; new retropulsed bony fragment at L5; no other new lesions. His previous CT was in 05/2022 and he did not start Lenvima  until 07/2022, so it's hard to evaluate if he is responding to Lenvima  or not, the progression is likely the nature course of his [facility]  -Restaging CT scan from January 02, 2023 showed slightly improved previously treated liver lesion, and additional area of concern in the liver which are difficult to compare with previous scan due to different timing of contrast.  No other evidence of disease progression.  Plan to obtain MRI as next scan.  I personally reviewed the CT scan images and discussed the findings with patient -Will continue Lenvima , he is tolerating well overall. -Due to his weight being above 60 kg now, I increased his Lenvima  to 12 mg daily in Feb 2024 -repeated liver MRI on 04/23/2023 showed stable treated liver lesion, L5 bone mets has slightly increased in size.  Patient developed worsening low back pain in July 2024, repeated lumbar MRI showed significant disease progression in the L5 bone lesion.  I personally reviewed the scan images and discussed the findings with patient. -I recommend palliative radiation to his L5 lesion for pain control -I recommend changing systemic treatment to immunotherapy, I discussed option of atezolizumab and bevacizumab, durvalumab  and Imjudo , nivo and ipi etc. since he previously progressed on oral TKI, I recommend him to proceed with durvalumab  and Imjudo . He started on 06/30/23 -the goal of therapy is curative/palliative to prolong life and prevent/improve cancer related symptoms. -he underwent  Bilateral laminotomies L5-S1 with decompression of the spinal canal and the L5 nerve roots epidural tumor by Dr. Colon On 11/20/2023 -he received SBRT to low back in Feb 2025 -CT and abdominal MRI showed new enhancing lesion posteriorly in segment 7, suspicious for recurrence/metastasis, otherwise stable treated liver lesions and bone mets.  I discussed option of ablation versus SBRT radiation.  Patient prefers SBRT. -due to worsening LFTs, I stopped Durvalumab  in May 2025  -I referred him back to Dr. Patrcia and plan to change systemic therapy to carbozentinib when his LFT improves  -He started SBRT to new oligo liver met on 05/05/2024 and finished on 05/17/24 -He started ramping dose of cabozantinib on May 24, 2024. He is tolerating full dose well. It was held in Oct 2025 due to Transcatheter Aortic Valve Replacement on 09/28/2024 and restarted in early Dec 2025

## 2024-11-24 ENCOUNTER — Inpatient Hospital Stay: Admitting: Hematology

## 2024-11-24 ENCOUNTER — Inpatient Hospital Stay: Attending: Hematology

## 2024-11-24 VITALS — BP 160/88 | HR 79 | Temp 98.0°F | Resp 17 | Ht 67.0 in | Wt 127.9 lb

## 2024-11-24 DIAGNOSIS — N281 Cyst of kidney, acquired: Secondary | ICD-10-CM | POA: Insufficient documentation

## 2024-11-24 DIAGNOSIS — R911 Solitary pulmonary nodule: Secondary | ICD-10-CM | POA: Insufficient documentation

## 2024-11-24 DIAGNOSIS — Z79899 Other long term (current) drug therapy: Secondary | ICD-10-CM | POA: Insufficient documentation

## 2024-11-24 DIAGNOSIS — B199 Unspecified viral hepatitis without hepatic coma: Secondary | ICD-10-CM | POA: Diagnosis not present

## 2024-11-24 DIAGNOSIS — I700 Atherosclerosis of aorta: Secondary | ICD-10-CM | POA: Insufficient documentation

## 2024-11-24 DIAGNOSIS — I2510 Atherosclerotic heart disease of native coronary artery without angina pectoris: Secondary | ICD-10-CM | POA: Insufficient documentation

## 2024-11-24 DIAGNOSIS — Z7982 Long term (current) use of aspirin: Secondary | ICD-10-CM | POA: Diagnosis not present

## 2024-11-24 DIAGNOSIS — R197 Diarrhea, unspecified: Secondary | ICD-10-CM | POA: Diagnosis not present

## 2024-11-24 DIAGNOSIS — N200 Calculus of kidney: Secondary | ICD-10-CM | POA: Diagnosis not present

## 2024-11-24 DIAGNOSIS — I10 Essential (primary) hypertension: Secondary | ICD-10-CM | POA: Insufficient documentation

## 2024-11-24 DIAGNOSIS — Z923 Personal history of irradiation: Secondary | ICD-10-CM | POA: Insufficient documentation

## 2024-11-24 DIAGNOSIS — C7951 Secondary malignant neoplasm of bone: Secondary | ICD-10-CM | POA: Insufficient documentation

## 2024-11-24 DIAGNOSIS — C220 Liver cell carcinoma: Secondary | ICD-10-CM | POA: Diagnosis not present

## 2024-11-24 DIAGNOSIS — K7460 Unspecified cirrhosis of liver: Secondary | ICD-10-CM | POA: Insufficient documentation

## 2024-11-24 DIAGNOSIS — G8190 Hemiplegia, unspecified affecting unspecified side: Secondary | ICD-10-CM | POA: Diagnosis not present

## 2024-11-24 DIAGNOSIS — I350 Nonrheumatic aortic (valve) stenosis: Secondary | ICD-10-CM | POA: Diagnosis not present

## 2024-11-24 DIAGNOSIS — Z9221 Personal history of antineoplastic chemotherapy: Secondary | ICD-10-CM | POA: Insufficient documentation

## 2024-11-24 LAB — CBC WITH DIFFERENTIAL (CANCER CENTER ONLY)
Abs Immature Granulocytes: 0.01 K/uL (ref 0.00–0.07)
Basophils Absolute: 0 K/uL (ref 0.0–0.1)
Basophils Relative: 1 %
Eosinophils Absolute: 0.1 K/uL (ref 0.0–0.5)
Eosinophils Relative: 3 %
HCT: 37.2 % — ABNORMAL LOW (ref 39.0–52.0)
Hemoglobin: 12.5 g/dL — ABNORMAL LOW (ref 13.0–17.0)
Immature Granulocytes: 0 %
Lymphocytes Relative: 40 %
Lymphs Abs: 1.3 K/uL (ref 0.7–4.0)
MCH: 33.8 pg (ref 26.0–34.0)
MCHC: 33.6 g/dL (ref 30.0–36.0)
MCV: 100.5 fL — ABNORMAL HIGH (ref 80.0–100.0)
Monocytes Absolute: 0.3 K/uL (ref 0.1–1.0)
Monocytes Relative: 9 %
Neutro Abs: 1.6 K/uL — ABNORMAL LOW (ref 1.7–7.7)
Neutrophils Relative %: 47 %
Platelet Count: 78 K/uL — ABNORMAL LOW (ref 150–400)
RBC: 3.7 MIL/uL — ABNORMAL LOW (ref 4.22–5.81)
RDW: 14.1 % (ref 11.5–15.5)
WBC Count: 3.4 K/uL — ABNORMAL LOW (ref 4.0–10.5)
nRBC: 0 % (ref 0.0–0.2)

## 2024-11-24 LAB — CMP (CANCER CENTER ONLY)
ALT: 37 U/L (ref 0–44)
AST: 56 U/L — ABNORMAL HIGH (ref 15–41)
Albumin: 4.2 g/dL (ref 3.5–5.0)
Alkaline Phosphatase: 148 U/L — ABNORMAL HIGH (ref 38–126)
Anion gap: 9 (ref 5–15)
BUN: 14 mg/dL (ref 8–23)
CO2: 25 mmol/L (ref 22–32)
Calcium: 9.2 mg/dL (ref 8.9–10.3)
Chloride: 111 mmol/L (ref 98–111)
Creatinine: 1.34 mg/dL — ABNORMAL HIGH (ref 0.61–1.24)
GFR, Estimated: 58 mL/min — ABNORMAL LOW
Glucose, Bld: 54 mg/dL — ABNORMAL LOW (ref 70–99)
Potassium: 4.3 mmol/L (ref 3.5–5.1)
Sodium: 144 mmol/L (ref 135–145)
Total Bilirubin: 0.3 mg/dL (ref 0.0–1.2)
Total Protein: 7.3 g/dL (ref 6.5–8.1)

## 2024-11-24 NOTE — Progress Notes (Signed)
 " [facility]   [phone] Fax:([phone]   Clinic Follow up Note   Patient Care Team: [name], MD as PCP - General (Internal Medicine) [name], MD as PCP - Cardiology (Cardiology) [name], MD as PCP - Structural Heart (Cardiology) [name], MD as Attending Physician (Hematology and Oncology) [name], [name], NP as Nurse Practitioner (Hospice and Palliative Medicine) Imaging, [facility] as Radiologist (Diagnostic Radiology)  Date of Service:  11/24/2024  CHIEF COMPLAINT: f/u of HCC  CURRENT THERAPY:  Cabozantinib 60 mg daily  Oncology History   Hepatocellular carcinoma in adult [facility]) diagnosed 11/2019 by MRI for f/u of cirrhosis. S/p microwave ablation on 03/01/20. -MRI 06/01/22 showed suspicion for local recurrence and metastasis at L5. Biopsy of L5 confirmed metastatic HCC. S/p repeat microwave ablation on 07/17/22. -started lenvima  08/16/22 -staging CT CAP and bone scan on 09/19/22 showed: interval enlargement of right hepatic lobe HCC; new retropulsed bony fragment at L5; no other new lesions. His previous CT was in 05/2022 and he did not start Lenvima  until 07/2022, so it's hard to evaluate if he is responding to Lenvima  or not, the progression is likely the nature course of his [name]  -Restaging CT scan from January 02, 2023 showed slightly improved previously treated liver lesion, and additional area of concern in the liver which are difficult to compare with previous scan due to different timing of contrast.  No other evidence of disease progression.  Plan to obtain MRI as next scan.  I personally reviewed the CT scan images and discussed the findings with patient -Will continue Lenvima , he is tolerating well overall. -Due to his weight being above 60 kg now, I increased his Lenvima  to 12 mg daily in Feb 2024 -repeated liver MRI on 04/23/2023 showed stable treated liver lesion, L5 bone mets  has slightly increased in size.  Patient developed worsening low back pain in July 2024, repeated lumbar MRI showed significant disease progression in the L5 bone lesion.  I personally reviewed the scan images and discussed the findings with patient. -I recommend palliative radiation to his L5 lesion for pain control -I recommend changing systemic treatment to immunotherapy, I discussed option of atezolizumab and bevacizumab, durvalumab  and Imjudo , nivo and ipi etc. since he previously progressed on oral TKI, I recommend him to proceed with durvalumab  and Imjudo . He started on 06/30/23 -the goal of therapy is curative/palliative to prolong life and prevent/improve cancer related symptoms. -he underwent Bilateral laminotomies L5-S1 with decompression of the spinal canal and the L5 nerve roots epidural tumor by Dr. Colon On 11/20/2023 -he received SBRT to low back in Feb 2025 -CT and abdominal MRI showed new enhancing lesion posteriorly in segment 7, suspicious for recurrence/metastasis, otherwise stable treated liver lesions and bone mets.  I discussed option of ablation versus SBRT radiation.  Patient prefers SBRT. -due to worsening LFTs, I stopped Durvalumab  in May 2025  -I referred him back to Dr. Patrcia and plan to change systemic therapy to carbozentinib when his LFT improves  -He started SBRT to new oligo liver met on 05/05/2024 and finished on 05/17/24 -He started ramping dose of cabozantinib on May 24, 2024. He is tolerating full dose well. 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 Recent CT abdomen and pelvis showed no new lesions and no progression of liver or L5 vertebral metastases. He tolerates cabozantinib with only  manageable side effects and maintains adequate energy. No pain or new symptoms reported. - Continued cabozantinib at current dose, one tablet daily before breakfast. - Refilled cabozantinib prescription. - Scheduled next CT scan in 4-6 months if clinically stable. - Planned follow-up in clinic in 2 months. - Monitored blood counts, kidney, and liver function; results to be reviewed in MyChart.  medication-induced diarrhea and muscle cramps Intermittent diarrhea and muscle cramps, likely secondary to cabozantinib and possible electrolyte disturbances from concurrent furosemide  use. Diarrhea is well-controlled with as-needed loperamide. Muscle cramps may be related to hypokalemia, hypomagnesemia, or hypocalcemia; he is on potassium supplementation. No headache or visual changes. Electrolyte monitoring is pending. - Continued loperamide as needed for diarrhea. - Recommended multivitamin with minerals and considered over-the-counter magnesium , calcium , and potassium supplementation to reduce muscle cramps. - Monitored potassium and magnesium  levels; pending laboratory results. - Continued potassium supplementation with furosemide  as prescribed by cardiology. [name] cardiology for refills of furosemide  and potassium as needed. - Monitored blood pressure and weight at home.  Plan - Restaging CT scan from last month reviewed with patient and his wife, overall stable disease, no signs of cancer progression. - He is tolerating cabozantinib well overall, with mild diarrhea and cramps.  Will continue - Lab and follow-up in 2 months, plan to repeat a scan in 4 months.   SUMMARY OF ONCOLOGIC HISTORY: Oncology History  Hepatocellular carcinoma in adult [facility])  12/08/2019 Imaging   MR ABDOMEN WWO CONTRAST   IMPRESSION: Two small masses in the right hepatic lobe which have characteristics diagnostic for hepatocellular carcinoma in the setting of cirrhosis. (LI-RADS Category 5: Definitely HCC)    No evidence of abdominal metastatic disease or other significant abnormality.   03/01/2020 Initial Diagnosis   Hepatocellular carcinoma (HCC)   03/01/2020 Procedure   CT GUIDE TISSUE ABLATION     IMPRESSION: Successful percutaneous thermal ablation.   Signed,   [name], MD, RPVI   06/02/2020 Imaging   MR ABDOMEN WWO CONTRAST   IMPRESSION: 1. There has been interval ablation at two adjacent sites of the subcapsular right lobe of the liver, hepatic segment VI. There is no residual contrast enhancement at these adjacent sites. LI-RADS category 5 TR, nonviable. 2. There are scattered, subcentimeter foci of arterial hyperenhancement of the anterior right lobe of the liver, for example adjacent to the ablation site measuring 3 mm and in the medial anterior right lobe of the liver measuring 6 mm. This larger focus is unchanged compared to prior. Others were not previously seen. These findings are nonspecific and consistent with LI-RADS category 3. Attention on follow-up.   09/02/2020 Imaging   MR ABDOMEN WWO CONTRAST     IMPRESSION: 1. Post radiofrequency ablation along the margin of the RIGHT hemi liver. Heterogeneous appearance of this area on both T1 and T2. No sign of residual enhancement. LR TR nonviable. 2. Very subtle focus of restricted diffusion in the cephalad lateral segment LEFT hepatic lobe measuring 7 mm, corresponding to low signal on image 27 of series 21. Arterial enhancement is not seen in this area and this areas not seen on prior imaging. Not clear whether this represents a new lesion as this area is obscured largely by artifact on most sequences on today's study and on previous imaging evaluations. LR category 3, consider a 3 to six-month follow-up with abdominal  MRI. 3. Lesions of concern scattered about the RIGHT hepatic lobe may represent perfusional anomalies as they were not seen on today's study. 4. Stable mild dilation of the biliary  tree.   11/27/2020 Imaging   MR ABDOMEN WWO CONTRAST   IMPRESSION: Minimal decrease in size of ablation defect in the peripheral anterior right hepatic lobe. No evidence of locally recurrent hepatocellular carcinoma or other suspicious liver lesions.   No evidence of metastatic disease or other acute findings.   04/03/2021 Imaging   MR ABDOMEN WWO CONTRAST   IMPRESSION: Stable ablation defect in the lateral right hepatic lobe, without evidence of recurrent tumor at this site.   5 mm hypovascular lesion in segment 5 of the right lobe was not definitely seen on previous study, but is too small to characterize. This shows no arterial phase hyperenhancement. Recommend continued follow-up by MRI in 6 months.   No evidence of abdominal metastatic disease.        09/03/2021 Imaging   MR ABDOMEN WWO CONTRAST   IMPRESSION: 1. Side-by-side ablation sites in the right hepatic lobe in segment 6. The more anterior site remains largely cystic in appearance and no findings suspicious recurrent tumor in this area. There is however abnormal contrast enhancement around the larger more posterior and lateral ablation site worrisome for recurrent tumor. 2. Several small foci of early arterial phase enhancement in the liver. The peripheral lesions are likely vascular shunts. The more central lesions are likely dysplastic nodules. Recommend continued surveillance. 3. Stable bilateral renal cysts.   12/07/2021 Imaging   MR ABDOMEN WWO CONTRAST  IMPRESSION: 1. Ablation defects in the right lobe of the liver, similar to the prior study, without definitive evidence to suggest local recurrence of disease or metastatic disease. 2. Multiple tiny arterial phase areas of nodular hyperenhancement scattered throughout the liver, predominantly in the right lobe of the liver, without perceptible signal abnormality or diffusion restriction on other pulse sequences, nonspecific, and likely small benign  areas of vascular shunting. Continued attention on follow-up imaging is recommended. 3. Multiple Bosniak class 1 and Bosniak class 2 cysts in the kidneys bilaterally, similar to the prior study, as above. 4. Aortic atherosclerosis.     06/01/2022 Imaging   MR ABDOMEN WWO CONTRAST   IMPRESSION: 1. LR TR equivocal but suspicious findings with respect to previously treated disease in the RIGHT hepatic lobe given nodular enhancement adjacent and anterior superior to the ablation zone as described. 2. Given lack of late arterial phase due to bolus timing a second area of abnormality within the liver is suspicious for additional site of hepatocellular carcinoma showing washout appearance and capsule appearance on current imaging, cardiac pulsation limiting assessment in the area of concern. 3. Suspect bony metastatic disease in the lower lumbar spine at L5 level. Dedicated spinal imaging may be helpful for further evaluation.     06/18/2022 Imaging   MR Lumbar Spine W Wo Contrast   IMPRESSION: L5 vertebral body lesion is most consistent with a metastasis. There is probable trace ventral epidural extension   07/17/2022 Procedure   CT GUIDE TISSUE ABLATION   IMPRESSION: Successful CT-guided microwave ablation of recurrent hepatocellular carcinoma.   07/17/2022 Procedure   CT BONE TROCAR/NEEDLE BIOPSY SUPERFICIAL   IMPRESSION: Successful CT-guided core biopsy of L5 bone lesion.      07/17/2022 Pathology Results   SURGICAL PATHOLOGY  CASE: [id number]  PATIENT: ELLAREE SIERRA  Surgical Pathology Report   SURGICAL PATHOLOGY  CASE: [id number]  PATIENT: [facility]  Surgical  Pathology Report      Clinical History: Hx of cirrhosis and small HCC with unexpected  suspicious lesion at L5. Rare HCC met to bone vs atypical hemangioma or  met from new primary. (crm)      FINAL MICROSCOPIC DIAGNOSIS:   A. BONE, L5 VERTEBRAL BODY LESION, BIOPSY:  - Metastatic  carcinoma to bone, consistent with patient's clinical  history of primary hepatocellular carcinoma    06/25/2023 Imaging   CT chest without contrast  IMPRESSION: 1. No acute findings within the chest. No suspicious findings for metastatic disease within the chest. 2. Unchanged 3 mm nodule within the medial right upper lobe. No new or suspicious lung nodules. 3. Suboptimal visualization of the known liver lesions, reflecting lack of IV contrast material. Similar appearance of ablation defect within the lateral right lobe of liver. 4. Left renal calculi. 5. Coronary artery calcifications. 6.  Aortic Atherosclerosis   06/25/2023 Imaging   NM Bone Scan whole body  IMPRESSION: 1. Known L5 metastasis demonstrates low level uptake, mildly increased from previous bone scan. 2. No new lesions demonstrated.   06/30/2023 - 03/10/2024 Chemotherapy   Patient is on Treatment Plan : Hepatocellular Carcinoma Tremelimumab -actl C1 D1 + Durvalumab  q28d       Imaging        Discussed the use of AI scribe software for clinical note transcription with the patient, who gave verbal consent to proceed.  History of Present Illness Corey Carriger Sr. is a 69 year old male with metastatic hepatocellular carcinoma involving the liver and L5 vertebra, status post ablation, presenting for oncology follow-up to assess disease status and manage chemotherapy-related side effects.  He is on cabozantinib once daily before breakfast, restarted after recent cardiac surgery. CT abdomen and pelvis last month showed stable disease with no new lesions and no change in treated hepatic or L5 vertebral metastases. He denies abdominal or back pain and reports adequate energy.  He has intermittent diarrhea about twice daily with loose, large-volume stools, controlled with as-needed loperamide. He also has muscle cramps in the legs, feet, and fingers. He takes potassium supplementation and a multivitamin with minerals. He is  unsure of his most recent potassium level and labs are pending.  Prior headaches have resolved. He currently denies headaches or visual changes.  Blood pressure was elevated today. He monitors blood pressure and weight at home and has upcoming cardiology follow-up after a recent echocardiogram.     All other systems were reviewed with the patient and are negative.  MEDICAL HISTORY:  Past Medical History:  Diagnosis Date   Hepatitis    HX OF hEP c ?    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 Verlin and Dr. Daniel.   Severe aortic stenosis     SURGICAL HISTORY: Past Surgical History:  Procedure Laterality Date   BREAST BIOPSY Left 03/26/2024   US  LT BREAST BX W LOC DEV 1ST LESION IMG BX SPEC US  GUIDE 03/26/2024 [facility] MAMMOGRAPHY   DECOMPRESSIVE LUMBAR LAMINECTOMY LEVEL 1 N/A 11/20/2023   Procedure: lumbar laminectomy right Lumbar five with decompression of nerve roots and excision of tumor;  Surgeon: Colon Shove, MD;  Location: [facility] OR;  Service: Neurosurgery;  Laterality: N/A;   IR RADIOLOGIST EVAL & MGMT  01/25/2020   IR RADIOLOGIST EVAL & MGMT  03/21/2020  IR RADIOLOGIST EVAL & MGMT  06/07/2020   IR RADIOLOGIST EVAL & MGMT  09/05/2020   IR RADIOLOGIST EVAL & MGMT  12/05/2020   IR RADIOLOGIST EVAL & MGMT  05/09/2021   IR RADIOLOGIST EVAL & MGMT  09/06/2021   IR RADIOLOGIST EVAL & MGMT  12/12/2021   IR RADIOLOGIST EVAL & MGMT  06/25/2022   IR RADIOLOGIST EVAL & MGMT  07/24/2022   RADIOLOGY WITH ANESTHESIA N/A 03/01/2020   Procedure: CT WITH ANESTHESIA  THERMAL MICROWAVE  ABLATIION;  Surgeon: Karalee Beat, MD;  Location: [facility] ORS;  Service: Anesthesiology;  Laterality: N/A;   RADIOLOGY WITH ANESTHESIA N/A 07/17/2022   Procedure: CT MICROWAVE ABLATION;  Surgeon: Karalee Beat POUR, MD;  Location: [facility] ORS;   Service: Radiology;  Laterality: N/A;   RIGHT/LEFT HEART CATH AND CORONARY ANGIOGRAPHY N/A 08/05/2024   Procedure: RIGHT/LEFT HEART CATH AND CORONARY ANGIOGRAPHY;  Surgeon: Wonda Sharper, MD;  Location: [facility] INVASIVE CV LAB;  Service: Cardiovascular;  Laterality: N/A;   SPINE SURGERY     TRANSCATHETER AORTIC VALVE REPLACEMENT,CAROTID Left 09/28/2024   Procedure: Transcatheter Aortic Valve Replacement-Carotid;  Surgeon: [name], MD;  Location: [facility] INVASIVE CV LAB;  Service: Cardiovascular;  Laterality: Left;   TRANSESOPHAGEAL ECHOCARDIOGRAM (CATH LAB) N/A 09/28/2024   Procedure: TRANSESOPHAGEAL ECHOCARDIOGRAM;  Surgeon: [name], MD;  Location: [facility] INVASIVE CV LAB;  Service: Cardiovascular;  Laterality: N/A;    I have reviewed the social history and family history with the patient and they are unchanged from previous note.  ALLERGIES:  has no known allergies.  MEDICATIONS:  Current Outpatient Medications  Medication Sig Dispense Refill   acetaminophen  (TYLENOL ) 500 MG tablet Take 1,000 mg by mouth every 6 (six) hours as needed (pain.).     aspirin  EC 81 MG tablet Take 1 tablet (81 mg total) by mouth daily. Swallow whole. 30 tablet 12   atorvastatin  (LIPITOR) 40 MG tablet Take 1 tablet (40 mg total) by mouth daily. 30 tablet 11   CABOMETYX  60 MG tablet TAKE 1 TABLET BY MOUTH 1 TIME A DAY ON AN EMPTY STOMACH, 1 HOUR BEFORE OR 2 HOURS AFTER MEALS. 30 tablet 1   furosemide  (LASIX ) 40 MG tablet Take 1 tablet (40 mg total) by mouth daily.     hydrALAZINE  (APRESOLINE ) 10 MG tablet TAKE 1 TABLET 2 (TWO) TIMES DAILY AS NEEDED. TAKE FOR SYSTOLIC BLOOD PRESSURE MORE THAN 170. (Patient taking differently: Pt only takes when BP is really high) 180 tablet 3   metoprolol  succinate (TOPROL  XL) 25 MG 24 hr tablet Take 1 tablet (25 mg total) by mouth daily. 90 tablet 3   olmesartan  (BENICAR ) 20 MG tablet Take 1 tablet (20 mg total) by mouth daily. 30 tablet 11   pantoprazole   (PROTONIX ) 20 MG tablet TAKE 1 TABLET (20 MG TOTAL) BY MOUTH DAILY. TAKE WHEN YOU ARE PREDNISONE  90 tablet 1   potassium chloride  SA (KLOR-CON  M) 20 MEQ tablet Take 1 tablet (20 mEq total) by mouth daily. 90 tablet 0   No current facility-administered medications for this visit.    PHYSICAL EXAMINATION: ECOG PERFORMANCE STATUS: 1 - Symptomatic but completely ambulatory  Vitals:   11/24/24 1500 11/24/24 1545  BP: (!) 166/80 (!) 160/88  Pulse: 79   Resp: 17   Temp: 98 F (36.7 C)   SpO2: 98%    Wt Readings from Last 3 Encounters:  11/24/24 127 lb 14.4 oz (58 kg)  10/04/24 129 lb 6.4 oz (58.7 kg)  09/29/24 127 lb 11.2  oz (57.9 kg)     GENERAL:alert, no distress and comfortable SKIN: skin color, texture, turgor are normal, no rashes or significant lesions EYES: normal, Conjunctiva are pink and non-injected, sclera clear NECK: supple, thyroid  normal size, non-tender, without nodularity LYMPH:  no palpable lymphadenopathy in the cervical, axillary  LUNGS: clear to auscultation and percussion with normal breathing effort HEART: regular rate & rhythm and no murmurs and no lower extremity edema ABDOMEN:abdomen soft, non-tender and normal bowel sounds Musculoskeletal:no cyanosis of digits and no clubbing  NEURO: alert & oriented x 3 with fluent speech, no focal motor/sensory deficits  Physical Exam    LABORATORY DATA:  I have reviewed the data as listed    Latest Ref Rng & Units 11/24/2024    3:24 PM 09/29/2024    3:26 AM 09/28/2024   10:02 AM  CBC  WBC 4.0 - 10.5 K/uL 3.4  5.2    Hemoglobin 13.0 - 17.0 g/dL 87.4  89.6  9.9   Hematocrit 39.0 - 52.0 % 37.2  31.7  29.0   Platelets 150 - 400 K/uL 78  137          Latest Ref Rng & Units 11/24/2024    3:24 PM 09/29/2024    3:26 AM 09/28/2024   10:02 AM  CMP  Glucose 70 - 99 mg/dL 54  98  887   BUN 8 - 23 mg/dL 14  19  22    Creatinine 0.61 - 1.24 mg/dL 8.65  8.95  9.09   Sodium 135 - 145 mmol/L 144  140  143   Potassium  3.5 - 5.1 mmol/L 4.3  4.0  4.1   Chloride 98 - 111 mmol/L 111  105  112   CO2 22 - 32 mmol/L 25  21    Calcium  8.9 - 10.3 mg/dL 9.2  9.1    Total Protein 6.5 - 8.1 g/dL 7.3     Total Bilirubin 0.0 - 1.2 mg/dL 0.3     Alkaline Phos 38 - 126 U/L 148     AST 15 - 41 U/L 56     ALT 0 - 44 U/L 37         RADIOGRAPHIC STUDIES: I have personally reviewed the radiological images as listed and agreed with the findings in the report. ECHOCARDIOGRAM COMPLETE Result Date: 11/23/2024    ECHOCARDIOGRAM REPORT   Patient Name:   Corey Havlin Sr. Date of Exam: 11/23/2024 Medical Rec #:  [id number]                  Height:       67.0 in Accession #:    [id number]                 Weight:       129.4 lb Date of Birth:  February 01, 1956                   BSA:          1.681 m Patient Age:    68 years                   BP:           120/770 mmHg Patient Gender: M                          HR:           72 bpm. Exam Location:  Outpatient Procedure: 2D  Echo, 3D Echo, Color Doppler, Cardiac Doppler and Strain Analysis            (Both Spectral and Color Flow Doppler were utilized during            procedure). Indications:    TAVR  History:        Patient has prior history of Echocardiogram examinations, most                 recent 09/29/2024. CAD, TIA and PAD; Risk Factors:Former Smoker                 and Hypertension. Metastatic cancer to bone;                 s/p TAVR with a 26 mm Edwards Sapien 3 Ultra Resilia THV                 09/28/2024                 HFrEF.                  Aortic Valve: 26 mm Sapien prosthetic, stented (TAVR) valve is                 present in the aortic position.  Sonographer:    Orvil Holmes RDCS Referring Phys: [id number] KATHRYN R THOMPSON IMPRESSIONS  1. Compared with the echo 09/2024, systolic function is worse. Left ventricular ejection fraction, by estimation, is 35 to 40%. Left ventricular ejection fraction by 3D volume is 37 %. The left ventricle has moderately decreased function. The  left ventricle demonstrates global hypokinesis. There is moderate concentric left ventricular hypertrophy. Left ventricular diastolic parameters are consistent with Grade I diastolic dysfunction (impaired relaxation). The average left ventricular global longitudinal strain is -9.0 %. The global longitudinal strain is abnormal.  2. Right ventricular systolic function is normal. The right ventricular size is normal.  3. The mitral valve is normal in structure. Mild mitral valve regurgitation. No evidence of mitral stenosis.  4. The aortic valve has been repaired/replaced. Aortic valve regurgitation is trivial. No aortic stenosis is present. There is a 26 mm Sapien prosthetic (TAVR) valve present in the aortic position. Aortic valve area, by VTI measures 1.25 cm. Aortic valve mean gradient measures 7.0 mmHg. Aortic valve Vmax measures 1.75 m/s.  5. The inferior vena cava is normal in size with greater than 50% respiratory variability, suggesting right atrial pressure of 3 mmHg. FINDINGS  Left Ventricle: Compared with the echo 09/2024, systolic function is worse. Left ventricular ejection fraction, by estimation, is 35 to 40%. Left ventricular ejection fraction by 3D volume is 37 %. The left ventricle has moderately decreased function. The left ventricle demonstrates global hypokinesis. The average left ventricular global longitudinal strain is -9.0 %. Strain was performed and the global longitudinal strain is abnormal. The left ventricular internal cavity size was normal in size. There is moderate concentric left ventricular hypertrophy. Left ventricular diastolic parameters are consistent with Grade I diastolic dysfunction (impaired relaxation). Right Ventricle: The right ventricular size is normal. No increase in right ventricular wall thickness. Right ventricular systolic function is normal. Left Atrium: Left atrial size was normal in size. Right Atrium: Right atrial size was normal in size. Pericardium: Trivial  pericardial effusion is present. Mitral Valve: The mitral valve is normal in structure. Mild mitral valve regurgitation. No evidence of mitral valve stenosis. Tricuspid Valve: The tricuspid valve is normal in structure. Tricuspid valve regurgitation is not  demonstrated. No evidence of tricuspid stenosis. Aortic Valve: The aortic valve has been repaired/replaced. Aortic valve regurgitation is trivial. Aortic regurgitation PHT measures 226 msec. No aortic stenosis is present. Aortic valve mean gradient measures 7.0 mmHg. Aortic valve peak gradient measures  12.2 mmHg. Aortic valve area, by VTI measures 1.25 cm. There is a 26 mm Sapien prosthetic, stented (TAVR) valve present in the aortic position. Pulmonic Valve: The pulmonic valve was normal in structure. Pulmonic valve regurgitation is not visualized. No evidence of pulmonic stenosis. Aorta: The aortic root is normal in size and structure. Venous: The inferior vena cava is normal in size with greater than 50% respiratory variability, suggesting right atrial pressure of 3 mmHg. IAS/Shunts: No atrial level shunt detected by color flow Doppler. Additional Comments: 3D was performed not requiring image post processing on an independent workstation and was abnormal.  LEFT VENTRICLE PLAX 2D LVIDd:         4.72 cm         Diastology LVIDs:         2.97 cm         LV e' medial:    4.90 cm/s LV PW:         1.01 cm         LV E/e' medial:  9.7 LV IVS:        0.95 cm         LV e' lateral:   3.15 cm/s LVOT diam:     1.90 cm         LV E/e' lateral: 15.1 LV SV:         41 LV SV Index:   25              2D Longitudinal LVOT Area:     2.84 cm        Strain                                2D Strain GLS   -11.0 %                                (A4C): LV Volumes (MOD)               2D Strain GLS   -7.8 % LV vol d, MOD    120.0 ml      (A3C): A2C:                           2D Strain GLS   -8.1 % LV vol d, MOD    118.0 ml      (A2C): A4C:                           2D Strain GLS    -9.0 % LV vol s, MOD    72.4 ml       Avg: A2C: LV vol s, MOD    79.1 ml       3D Volume EF A4C:                           LV 3D EF:    Left LV SV MOD A2C:   47.6 ml  ventricul LV SV MOD A4C:   118.0 ml                   ar LV SV MOD BP:    46.5 ml                    ejection                                             fraction                                             by 3D                                             volume is                                             37 %.                                 3D Volume EF:                                3D EF:        37 %                                LV EDV:       169 ml                                LV ESV:       107 ml                                LV SV:        62 ml RIGHT VENTRICLE RV S prime:     7.72 cm/s TAPSE (M-mode): 1.8 cm LEFT ATRIUM           Index        RIGHT ATRIUM           Index LA diam:      3.10 cm 1.84 cm/m   RA Area:     13.80 cm LA Vol (A4C): 22.6 ml 13.45 ml/m  RA Volume:   34.00 ml  20.23 ml/m  AORTIC VALVE AV Area (Vmax):    1.14 cm AV Area (Vmean):   1.04 cm AV Area (VTI):     1.25 cm AV Vmax:           175.00 cm/s AV Vmean:          120.000 cm/s AV VTI:            0.331 m AV Peak Grad:  12.2 mmHg AV Mean Grad:      7.0 mmHg LVOT Vmax:         70.40 cm/s LVOT Vmean:        44.000 cm/s LVOT VTI:          0.146 m LVOT/AV VTI ratio: 0.44 AI PHT:            226 msec  AORTA Ao Root diam: 2.20 cm Ao Asc diam:  3.40 cm MITRAL VALVE MV Area (PHT): 3.12 cm     SHUNTS MV Decel Time: 243 msec     Systemic VTI:  0.15 m MV E velocity: 47.50 cm/s   Systemic Diam: 1.90 cm MV A velocity: 103.00 cm/s MV E/A ratio:  0.46 Annabella Scarce MD Electronically signed by Annabella Scarce MD Signature Date/Time: 11/23/2024/3:55:19 PM    Final       No orders of the defined types were placed in this encounter.  All questions were answered. The patient knows to call the clinic with any problems, questions or concerns. No  barriers to learning was detected. The total time spent in the appointment was 25 minutes, including review of chart and various tests results, discussions about plan of care and coordination of care plan     Onita Mattock, MD 11/24/2024     "

## 2024-11-30 ENCOUNTER — Encounter (HOSPITAL_BASED_OUTPATIENT_CLINIC_OR_DEPARTMENT_OTHER): Payer: Self-pay | Admitting: *Deleted

## 2024-12-03 ENCOUNTER — Telehealth (HOSPITAL_COMMUNITY): Payer: Self-pay

## 2024-12-03 ENCOUNTER — Ambulatory Visit (HOSPITAL_BASED_OUTPATIENT_CLINIC_OR_DEPARTMENT_OTHER): Admitting: Cardiovascular Disease

## 2024-12-03 ENCOUNTER — Other Ambulatory Visit: Payer: Self-pay

## 2024-12-03 DIAGNOSIS — I509 Heart failure, unspecified: Secondary | ICD-10-CM

## 2024-12-03 DIAGNOSIS — Z8639 Personal history of other endocrine, nutritional and metabolic disease: Secondary | ICD-10-CM

## 2024-12-03 NOTE — Telephone Encounter (Signed)
 Last written 09-06-24 #90 Last OV 09-06-24 No Future OV [facility]

## 2024-12-03 NOTE — Telephone Encounter (Signed)
 Attempted f/u call to schedule cardiac rehab- no answer, left message.  Closing referral.

## 2024-12-10 ENCOUNTER — Ambulatory Visit (INDEPENDENT_AMBULATORY_CARE_PROVIDER_SITE_OTHER): Admitting: Cardiovascular Disease

## 2024-12-10 ENCOUNTER — Encounter (HOSPITAL_BASED_OUTPATIENT_CLINIC_OR_DEPARTMENT_OTHER): Payer: Self-pay | Admitting: Cardiovascular Disease

## 2024-12-10 VITALS — BP 132/84 | HR 77 | Ht 67.0 in | Wt 129.9 lb

## 2024-12-10 DIAGNOSIS — G459 Transient cerebral ischemic attack, unspecified: Secondary | ICD-10-CM | POA: Diagnosis not present

## 2024-12-10 DIAGNOSIS — Z952 Presence of prosthetic heart valve: Secondary | ICD-10-CM

## 2024-12-10 DIAGNOSIS — Z5181 Encounter for therapeutic drug level monitoring: Secondary | ICD-10-CM

## 2024-12-10 DIAGNOSIS — I350 Nonrheumatic aortic (valve) stenosis: Secondary | ICD-10-CM | POA: Diagnosis not present

## 2024-12-10 DIAGNOSIS — I10 Essential (primary) hypertension: Secondary | ICD-10-CM | POA: Diagnosis not present

## 2024-12-10 MED ORDER — HYDROCHLOROTHIAZIDE 25 MG PO TABS: 25.0000 mg | ORAL_TABLET | Freq: Every day | ORAL | 3 refills | Status: AC

## 2024-12-10 MED ORDER — FUROSEMIDE 40 MG PO TABS: 40.0000 mg | ORAL_TABLET | ORAL | 3 refills | Status: AC | PRN

## 2024-12-10 NOTE — Progress Notes (Signed)
 " Cardiology Office Note:  .   Date:  12/10/2024  ID:  Corey Tarri Sierra Sr., DOB 1956-09-27, MRN [id number] PCP: [name], MD  [facility] Providers Cardiologist:  Annabella Scarce, MD Structural Heart:  Lonni Cash, MD   History of Present Illness: .   Corey Gadd Sr. is a 69 y.o. male with medically managed obstructive coronary disease, bicuspid aortic valve, severe low flow, low gradient AS s/p TAVR, hypertension, HFrEF, metastatic hepatocellular carcinoma, prior tobacco abuse and hypertension here for follow up.  He was first seen in the hospital 11/2023 when he was admitted with a pathological L5 fracture with cord impingement.  Cardiology was asked to see him for presurgical risk stratification given the finding of newly diagnosed severe aortic stenosis and a bicuspid valve.  He was not a candidate for aortic valve surgery at the time due to his new diagnosis of metastatic hepatocellular carcinoma.  He was noted to have a murmur since childhood.  Echo at that time revealed LVEF 60-65% with no wall motion abnormalities and grade 2 diastolic dysfunction.  He was admitted 11/2023 with strokelike symptoms.  Brain MRI was negative for stroke.  Symptoms resolved.  He was started on atorvastatin .     Corey Was admitted 06/2024 with dyspnea. [place].  Pro-BNP was 7746.  Echo revealed LVEF 30-35% with severe low-flow-low gradient aortic stenosis.  He subsequently followed up with his oncology team and his prognosis with liver cancer was felt to be greater than 1 year.  Therefore he was referred to the structural heart team and underwent cardiac catheterization 07/2024.  He was found to have 80% proximal LAD and mid left circumflex stenosis with otherwise moderate disease.  At his visit 09/2024 he was started on metoprolol .  He underwent implantation of a 26 mm Edwards Sapien 3 Ultra Resilia THV 09/2024.  Post operative echo revealed improvement in LVEF to 65-70% and the  TAVR mean gradient 10 mmHg.  Repeat echo 11/2024 LVEF 35-40%, grade 1 diastolic dysfunction, moderate LVH, mean gradient 7 mmHg.  Discussed the use of AI scribe software for clinical note transcription with the patient, who gave verbal consent to proceed.  History of Present Illness Corey. Armstrong has experienced significant improvement in breathing and energy levels since undergoing heart valve surgery. He no longer experiences fatigue and engages in physical activity throughout the day, primarily through his work, feeling 'pretty good' while working.  No chest pain, pressure, or breathing issues. No swelling in the legs or feet, which was initially present but has since resolved. No shortness of breath when lying down at night.  He monitors his blood pressure at home, noting it has been running high, around 160 mmHg, with a recent reading at the doctor's office of 147 mmHg. He is currently taking hydralazine  as needed, metoprolol  once a day, and olmesartan  once a day. However, he takes hydralazine  on a daily basis rather than as needed.  He has been experiencing a lot of cramps. He is also taking Lasix  (furosemide ) for fluid management.  ROS:  As per HPI  Studies Reviewed: .       Echo 11/2024:  1. Compared with the echo 09/2024, systolic function is worse. Left  ventricular ejection fraction, by estimation, is 35 to 40%. Left  ventricular ejection fraction by 3D volume is 37 %. The left ventricle has  moderately decreased function. The left  ventricle demonstrates global hypokinesis. There is moderate concentric  left ventricular hypertrophy. Left ventricular diastolic parameters  are  consistent with Grade I diastolic dysfunction (impaired relaxation). The  average left ventricular global  longitudinal strain is -9.0 %. The global longitudinal strain is abnormal.   2. Right ventricular systolic function is normal. The right ventricular  size is normal.   3. The mitral valve is normal in  structure. Mild mitral valve  regurgitation. No evidence of mitral stenosis.   4. The aortic valve has been repaired/replaced. Aortic valve  regurgitation is trivial. No aortic stenosis is present. There is a 26 mm  Sapien prosthetic (TAVR) valve present in the aortic position. Aortic  valve area, by VTI measures 1.25 cm. Aortic  valve mean gradient measures 7.0 mmHg. Aortic valve Vmax measures 1.75  m/s.   5. The inferior vena cava is normal in size with greater than 50%  respiratory variability, suggesting right atrial pressure of 3 mmHg.   Echo 09/2024: 1. Left ventricular ejection fraction, by estimation, is 65 to 70%. The  left ventricle has normal function. The left ventricle has no regional  wall motion abnormalities. There is moderate concentric left ventricular  hypertrophy. Left ventricular  diastolic parameters are consistent with Grade I diastolic dysfunction  (impaired relaxation).   2. Right ventricular systolic function is normal. The right ventricular  size is normal.   3. Left atrial size was mildly dilated.   4. Right atrial size was mildly dilated.   5. The mitral valve is normal in structure. Mild mitral valve  regurgitation. No evidence of mitral stenosis.   6. The aortic valve is normal in structure. Aortic valve regurgitation is  not visualized. No aortic stenosis is present. Procedure Date: 09/29/24.  Echo findings are consistent with normal structure and function of the  aortic valve prosthesis.   7. The inferior vena cava is normal in size with greater than 50%  respiratory variability, suggesting right atrial pressure of 3 mmHg.   Echo 06/2024:  1. LVEDV, 3D, 188 mL. Apical sparring pattern on strain polar map; in  conjunction with aortic valve disease, can be seen in ATTR-CA. Left  ventricular ejection fraction, by estimation, is 30 to 35%. Left  ventricular ejection fraction by 3D volume is 41  %. The left ventricle has moderately decreased function.  The left  ventricle demonstrates global hypokinesis. The left ventricular internal  cavity size was dilated. There is mild concentric left ventricular  hypertrophy. Left ventricular diastolic  parameters are indeterminate. The average left ventricular global  longitudinal strain is -10.7 %. The global longitudinal strain is  abnormal.   2. Right ventricular systolic function is mildly reduced. The right  ventricular size is normal. There is normal pulmonary artery systolic  pressure.   3. Left atrial size was severely dilated.   4. Right atrial size was mildly dilated.   5. The mitral valve is grossly normal. Severe mitral valve regurgitation.  No evidence of mitral stenosis.   6. There appear to be multiple jets vs eccentric flow; best seen in AC3  view. DVI 0.24. The aortic valve is calcified. Aortic valve regurgitation  is moderate. Severe aortic valve stenosis. Aortic valve area, by VTI  measures 0.76 cm. Aortic valve mean  gradient measures 33.0 mmHg. Aortic valve Vmax measures 3.87 m/s.   7. The inferior vena cava is normal in size with <50% respiratory  variability, suggesting right atrial pressure of 8 mmHg.   LHC 07/2024:   Prox LAD lesion is 80% stenosed.   Mid LAD lesion is 50% stenosed.   Mid Cx  lesion is 80% stenosed.   Prox RCA lesion is 50% stenosed.   Mid RCA lesion is 50% stenosed.   1.  Severe calcific aortic stenosis with mean gradient 41 mmHg, aortic valve area 0.67 cm 2.  Severe calcific 80% proximal LAD stenosis and diffuse 50% mid LAD stenosis 3.  Severe mid circumflex stenosis after the first OM 4.  Moderate RCA stenoses without high-grade disease 5.  Right heart catheterization data: RA mean 4 mmHg RV 43/7 mmHg PA 47/16 mean 25 mmHg Pulmonary wedge pressure A-wave 17, V wave 15, mean 13 mmHg LVEDP 8 mmHg Cardiac output/index 4.6/2.7   Recommendations: Multidisciplinary heart team reviewed.   Risk Assessment/Calculations:             Physical  Exam:   VS:  BP 132/84   Pulse 77   Ht 5' 7 (1.702 m)   Wt 129 lb 14.4 oz (58.9 kg)   SpO2 99%   BMI 20.35 kg/m  , BMI Body mass index is 20.35 kg/m. GENERAL:  Well appearing HEENT: Pupils equal round and reactive, fundi not visualized, oral mucosa unremarkable NECK:  No jugular venous distention, waveform within normal limits, carotid upstroke brisk and symmetric, no bruits, no thyromegaly LUNGS:  Clear to auscultation bilaterally HEART:  RRR.  PMI not displaced or sustained,S1 and S2 within normal limits, no S3, no S4, no clicks, no rubs, no murmurs ABD:  Flat, positive bowel sounds normal in frequency in pitch, no bruits, no rebound, no guarding, no midline pulsatile mass, no hepatomegaly, no splenomegaly EXT:  2 plus pulses throughout, no edema, no cyanosis no clubbing SKIN:  No rashes no nodules NEURO:  Cranial nerves II through XII grossly intact, motor grossly intact throughout PSYCH:  Cognitively intact, oriented to person place and time   ASSESSMENT AND PLAN: .    Assessment & Plan # Severe aortic stenosis status post transcatheter aortic valve replacement (TAVR) Post-TAVR status with improved symptoms and well-functioning valve.  Mean gradient 7 mmHg.  Valve functioning well.   # Primary hypertension Elevated blood pressure at home. Increased cardiac output post-TAVR may contribute. Tighter control needed to reduce stroke and myocardial infarction risk. - Switched furosemide  to hydrochlorothiazide  25mg  daily for better blood pressure control. - Instructed to monitor blood pressure at home and record readings.  BP goa <130/80. - Scheduled follow-up in 2-3 months to reassess blood pressure control. - Ordered basic metabolic panel and magnesium  level in one week to monitor electrolytes.  He reports cramping.   # Coronary artery disease Obstructive CAD on cath but no ischemic symptoms.  Continue aspirin , atorvastatin , and metoprolol .   # Heart failure with reduced ejection  fraction Post-TAVR LVEF normalized and now reduced again to 35-40%.  He is euvolemic on exam.  NYHA class I symptoms.  Switching furosemide  to hydrochlorothiazide  as above.  Continue olmesartan .  Continue to titrate GDMT.  SGLT2i at follow up.  - Switched furosemide  to hydrochlorothiazide , allowing use of furosemide  as needed for swelling.    07/23/2024 12/10/2024  KCCQ-12    1 a. Ability to shower/bathe Not at all limited  Not at all limited   1 b. Ability to walk 1 block Slightly limited  Slightly limited   1 c. Ability to hurry/jog Slightly limited  Moderately limited   2. Edema feet/ankles/legs Never over the past 2 weeks  Never over the past 2 weeks   3. Limited by fatigue 1-2 times a week  Less than once a week   4. Limited  by dyspnea 1-2 times a week  Never over the past 2 weeks   5. Sitting up / on 3+ pillows Every night  Never over the past 2 weeks   6. Limited enjoyment of life Slightly limited  Not limited at all   7. Rest of life w/ symptoms Mostly dissatisfied  Somewhat satisfied   8 a. Participation in hobbies Limited quite a bit  Slightly limited   8 b. Participation in chores Moderately limited  Slightly limited   8 c. Visiting family/friends Did not limit at all  Slightly limited          Dispo: f/u 3 months.   Signed, Annabella Scarce, MD   "

## 2024-12-10 NOTE — Patient Instructions (Addendum)
 Medication Instructions:  CHANGE FUROSEMIDE  TO AS NEEDED FOR shortness of breath OR SWELLING   START hydrochlorothiazide  25 MG DAILY   STOP POTASSIUM   *If you need a refill on your cardiac medications before your next appointment, please call your pharmacy*  Lab Work: BMET/MAGNESIUM  IN 1 WEEK   If you have labs (blood work) drawn today and your tests are completely normal, you will receive your results only by: MyChart Message (if you have MyChart) OR A paper copy in the mail If you have any lab test that is abnormal or we need to change your treatment, we will call you to review the results.  Testing/Procedures: NONE   Follow-Up: At [facility], you and your health needs are our priority.  As part of our continuing mission to provide you with exceptional heart care, our providers are all part of one team.  This team includes your primary Cardiologist (physician) and Advanced Practice Providers or APPs (Physician Assistants and Nurse Practitioners) who all work together to provide you with the care you need, when you need it.  Your next appointment:   IN 3 TO 4 MONTHS WITH DR RAFORD, CAITLIN W NP, OR MICHELLE S NP   We recommend signing up for the patient portal called MyChart.  Sign up information is provided on this After Visit Summary.  MyChart is used to connect with patients for Virtual Visits (Telemedicine).  Patients are able to view lab/test results, encounter notes, upcoming appointments, etc.  Non-urgent messages can be sent to your provider as well.   To learn more about what you can do with MyChart, go to [email or web address].   Other Instructions MONITOR BLOOD PRESSURE DAILY. BRING READINGS AND MACHINE TO FOLLOW UP

## 2025-01-24 ENCOUNTER — Inpatient Hospital Stay

## 2025-01-24 ENCOUNTER — Inpatient Hospital Stay: Admitting: Hematology

## 2025-03-29 ENCOUNTER — Ambulatory Visit (HOSPITAL_BASED_OUTPATIENT_CLINIC_OR_DEPARTMENT_OTHER): Admitting: Cardiovascular Disease

## 2025-08-25 ENCOUNTER — Ambulatory Visit (HOSPITAL_COMMUNITY)

## 2025-09-29 ENCOUNTER — Ambulatory Visit: Admitting: Physician Assistant

## 2025-09-29 ENCOUNTER — Ambulatory Visit (HOSPITAL_COMMUNITY)
